# Patient Record
Sex: Male | Born: 1940 | Race: White | Hispanic: No | Marital: Married | Smoking: Never smoker
Health system: Southern US, Community
[De-identification: ages and names within clinical notes are randomized; demographics above are authoritative.]

## PROBLEM LIST (undated history)

## (undated) DIAGNOSIS — R748 Abnormal levels of other serum enzymes: Secondary | ICD-10-CM

## (undated) DIAGNOSIS — M1990 Unspecified osteoarthritis, unspecified site: Secondary | ICD-10-CM

## (undated) DIAGNOSIS — R1310 Dysphagia, unspecified: Secondary | ICD-10-CM

## (undated) DIAGNOSIS — D494 Neoplasm of unspecified behavior of bladder: Secondary | ICD-10-CM

## (undated) DIAGNOSIS — I2510 Atherosclerotic heart disease of native coronary artery without angina pectoris: Secondary | ICD-10-CM

## (undated) DIAGNOSIS — I700 Atherosclerosis of aorta: Secondary | ICD-10-CM

## (undated) DIAGNOSIS — C159 Malignant neoplasm of esophagus, unspecified: Secondary | ICD-10-CM

## (undated) DIAGNOSIS — C801 Malignant (primary) neoplasm, unspecified: Secondary | ICD-10-CM

## (undated) HISTORY — PX: CHOLECYSTECTOMY: SHX55

## (undated) HISTORY — PX: COLONOSCOPY: SHX174

## (undated) HISTORY — PX: EYE SURGERY: SHX253

## (undated) HISTORY — PX: PICC LINE INSERTION: CATH118290

---
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
## 2005-11-14 ENCOUNTER — Ambulatory Visit (HOSPITAL_COMMUNITY): Admission: RE | Admit: 2005-11-14 | Discharge: 2005-11-15 | Payer: Self-pay | Admitting: Surgery

## 2005-11-14 IMAGING — RF DG CHOLANGIOGRAM OPERATIVE
1 series · 7 of 7 positions shown · non-contrast
Comparison: none

CLINICAL DATA: Cholecystectomy for cholelithiasis.  
 INTRAOPERATIVE CHOLANGIOGRAM:
 Procedure/findings:   Intraoperative images were obtained with a C-arm.    Contrast injection shows normal opacified biliary tree without evidence of filling defect or extravasation.  Prompt drainage of contrast is noted into the duodenum.

[Series 1: run · 2 acquisitions, 7 frames shown]
[im 1/2]
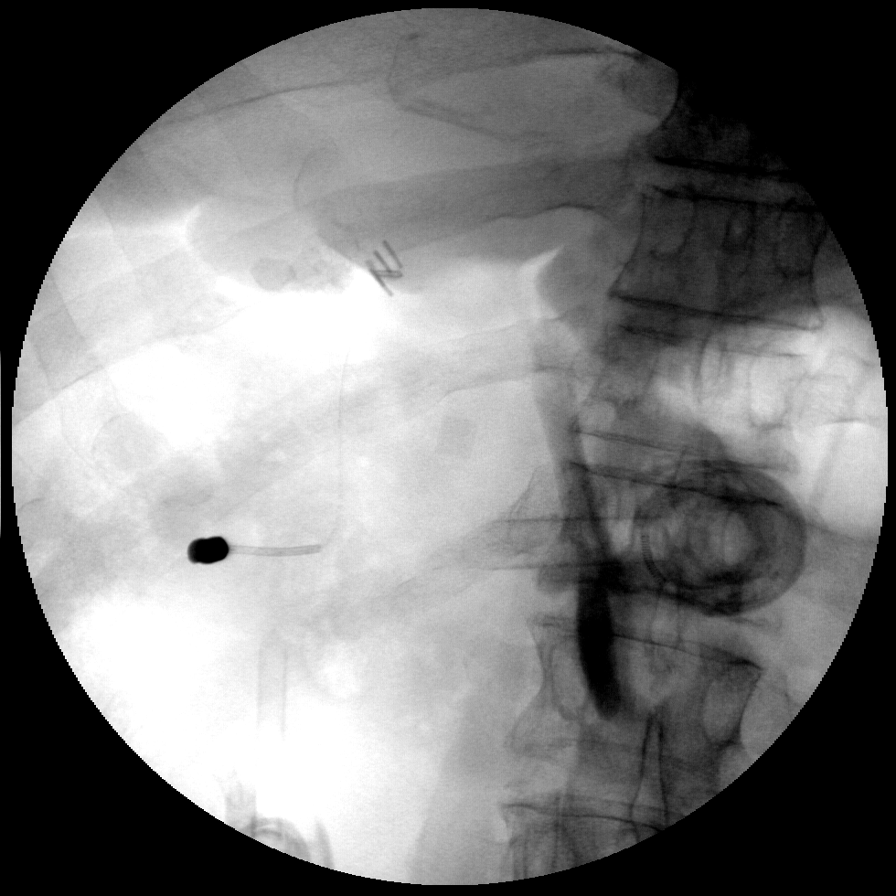
[im 1/2]
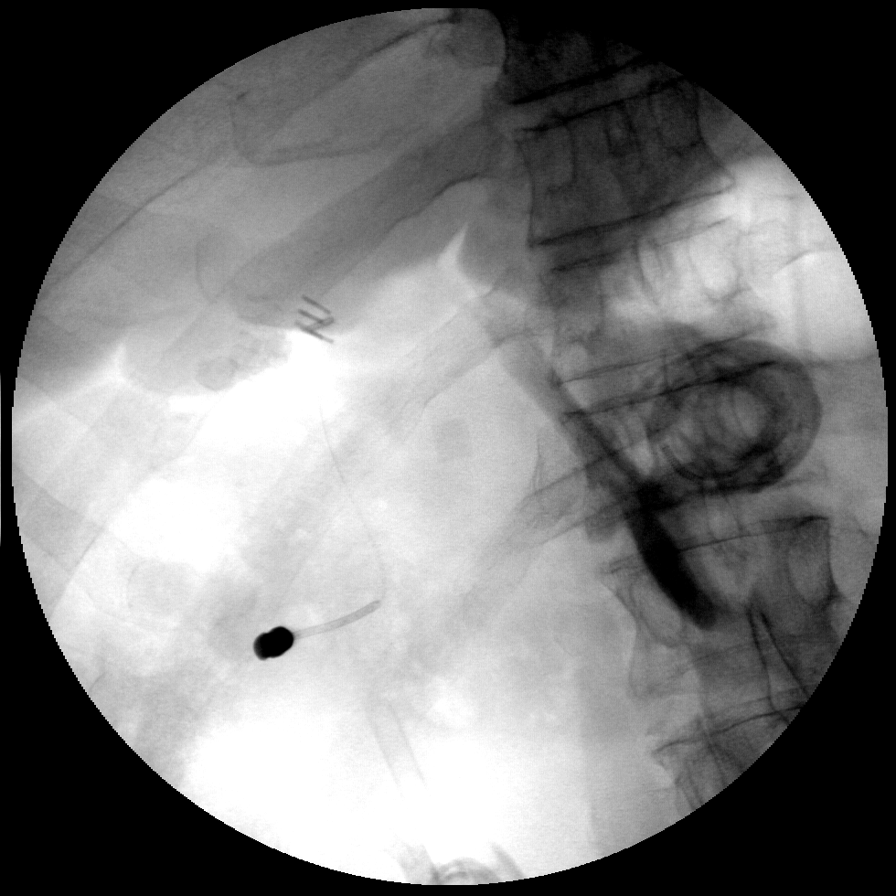
[im 1/2]
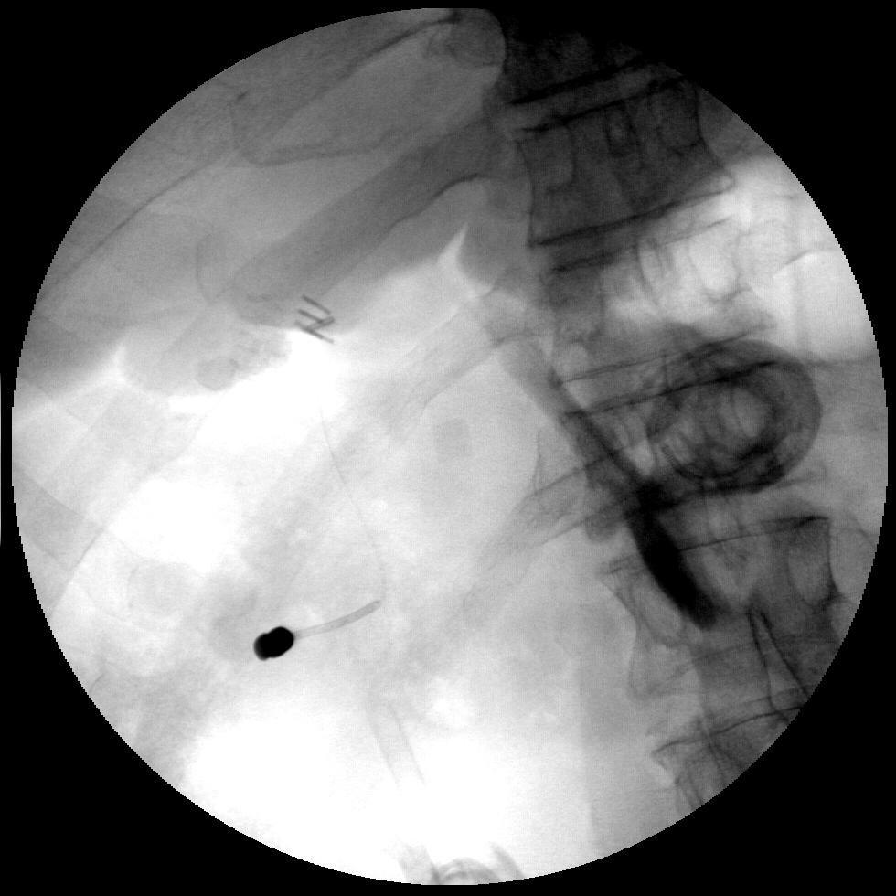
[im 2/2]
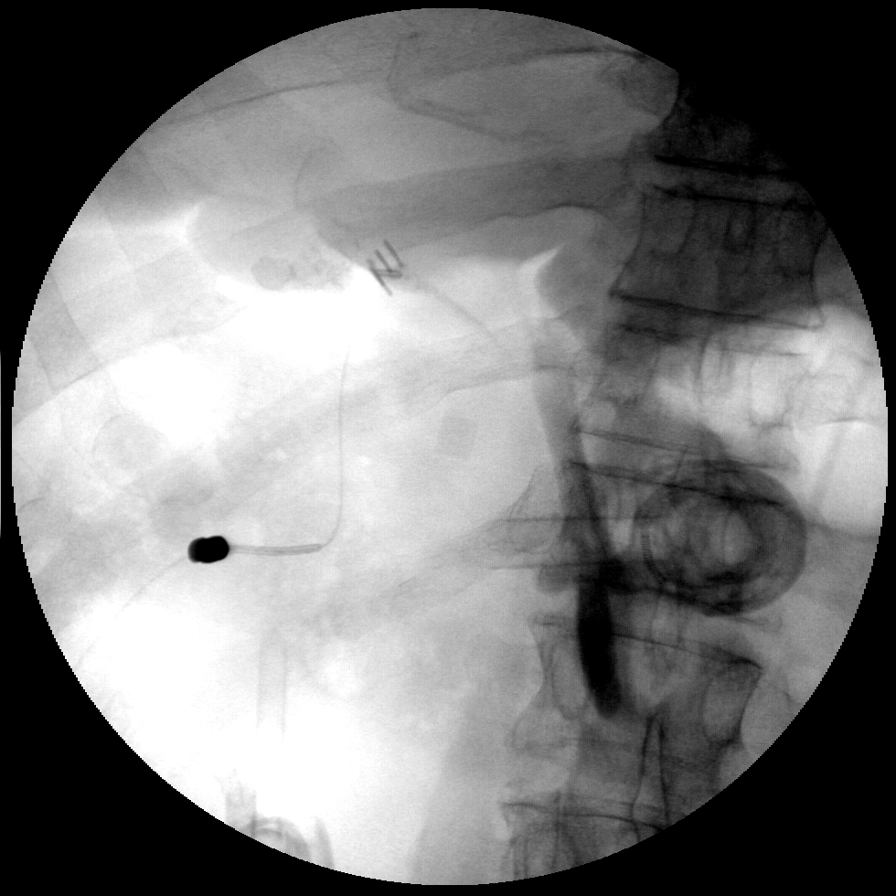
[im 2/2]
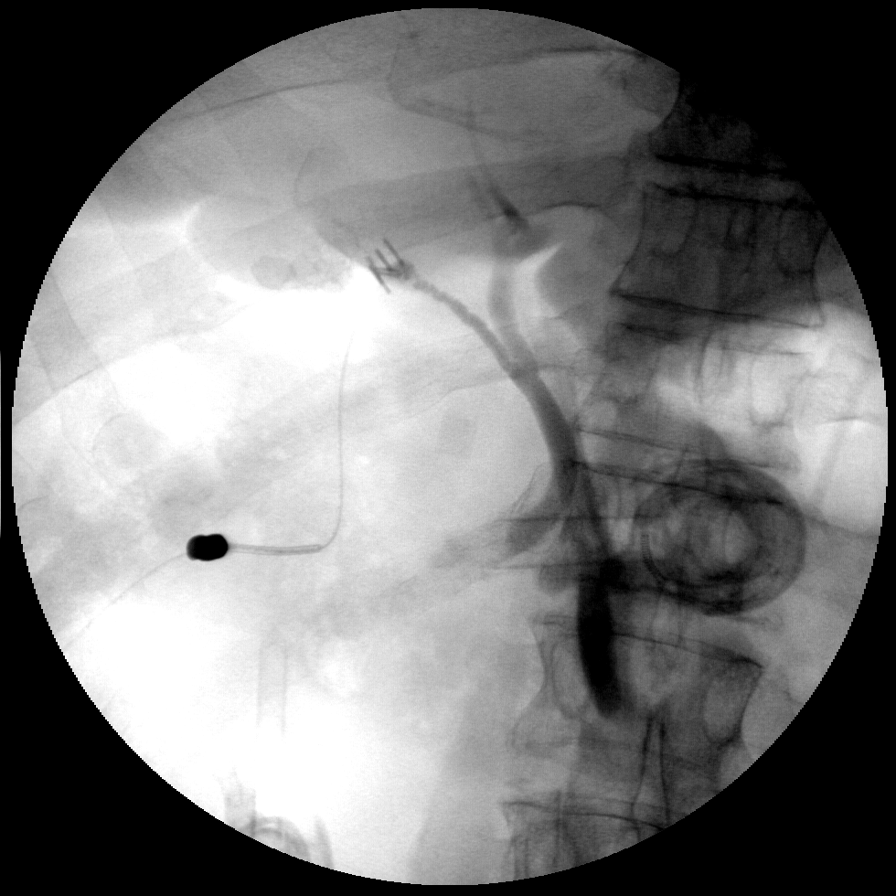
[im 2/2]
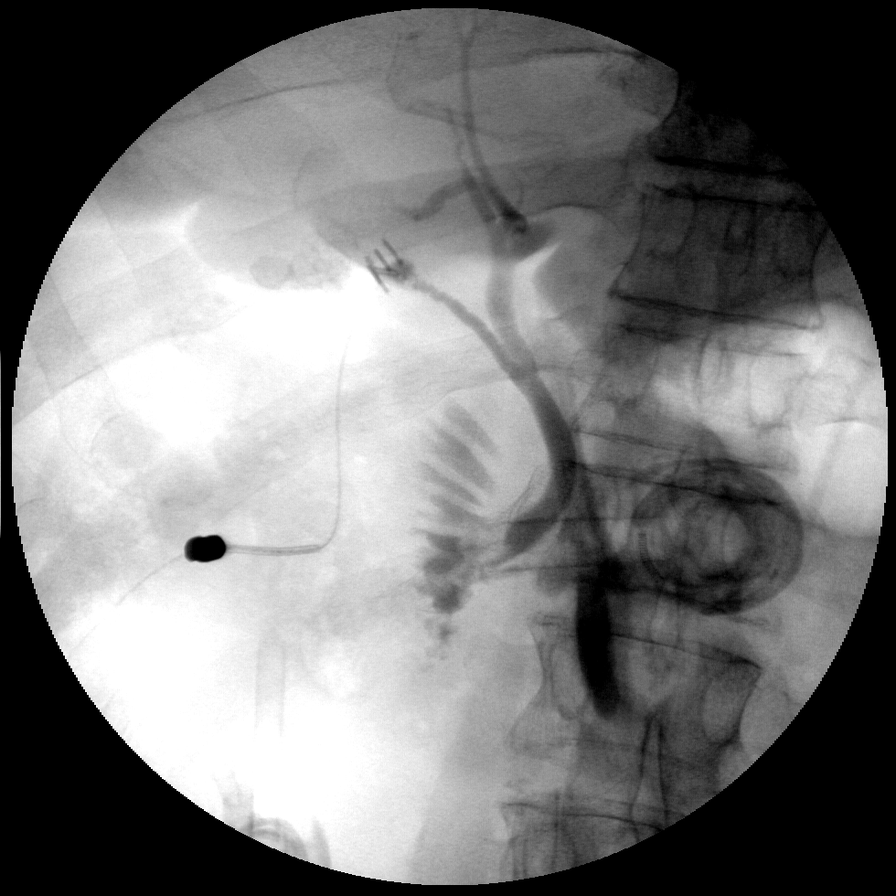
[im 2/2]
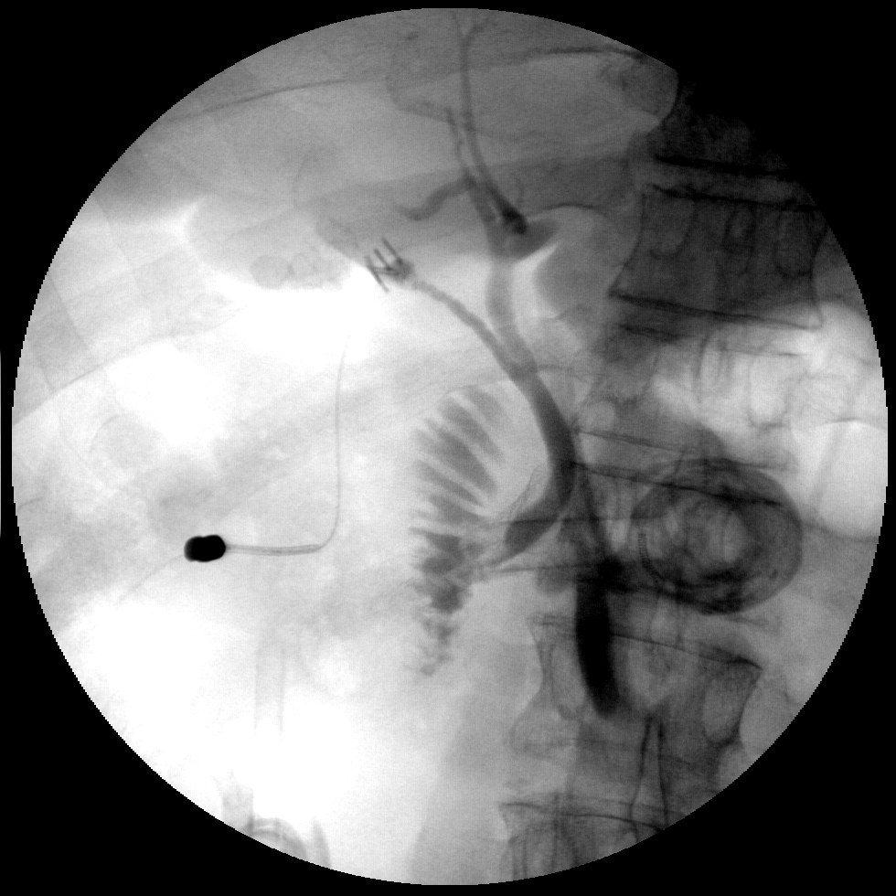

[7 of 7 positions shown; findings below may reference images not displayed]

IMPRESSION: Normal intraoperative cholangiogram.

## 2006-05-11 ENCOUNTER — Emergency Department (HOSPITAL_COMMUNITY): Admission: EM | Admit: 2006-05-11 | Discharge: 2006-05-11 | Payer: Self-pay | Admitting: Emergency Medicine

## 2006-05-11 IMAGING — CT CT ABDOMEN W/O CM
2 of 4 series · 16 of 46 positions shown, 18 images · IV contrast (agent unspecified)
Comparison: none

CLINICAL DATA: Right flank pain.  Right groin pain.  Elevated white count.  No history of stones.  Previous cholecystectomy. 
ABDOMEN CT WITHOUT CONTRAST:
TECHNIQUE: Multidetector CT imaging of the abdomen was performed following the standard protocol without IV contrast.
TECHNIQUE: Multidetector CT imaging of the pelvis was performed following the standard protocol without IV contrast.

[Series 2: stone_wo 5.0 b40f st · axial · 0.76mm/px · z∈[+688,+1040]mm · 13 of 96 slices shown, 15 images]
[im 4/96  soft-tissue]
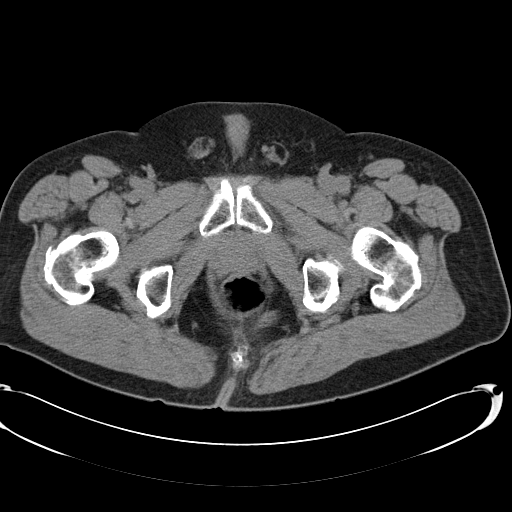
[im 4/96  bone]
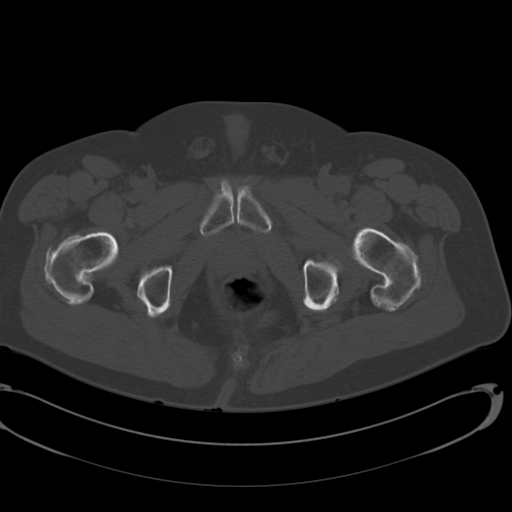
[im 12/96  soft-tissue]
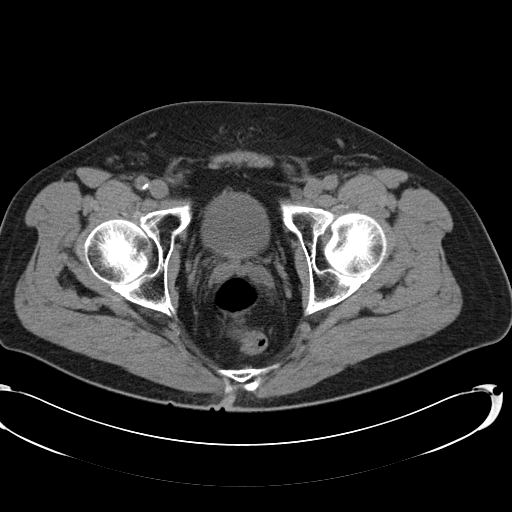
[im 20/96  soft-tissue]
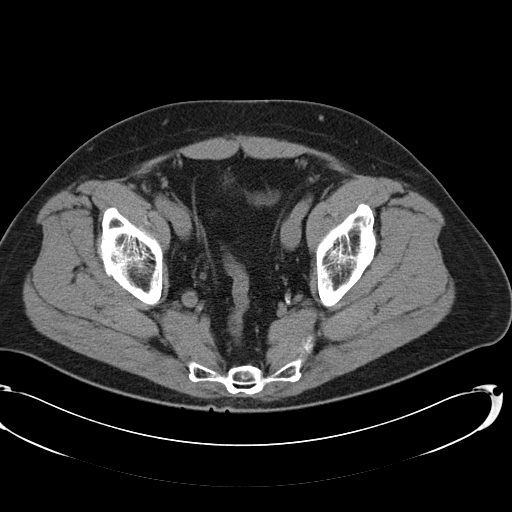
[im 27/96  soft-tissue]
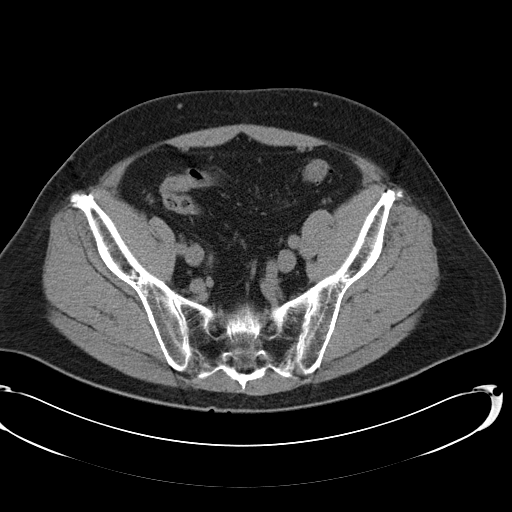
[im 35/96  soft-tissue]
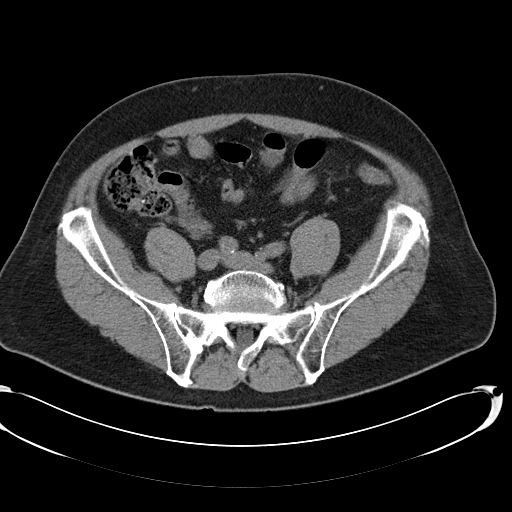
[im 42/96  soft-tissue]
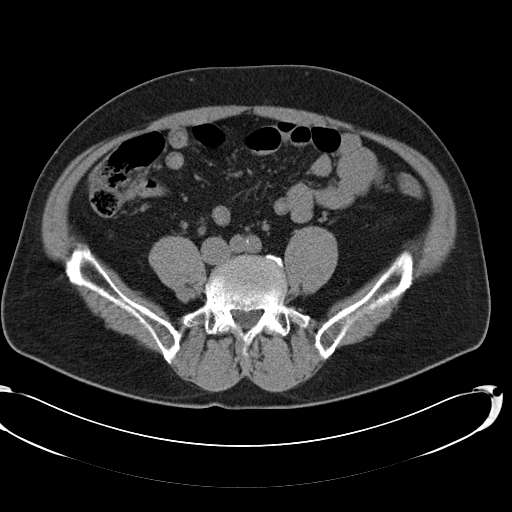
[im 50/96  soft-tissue]
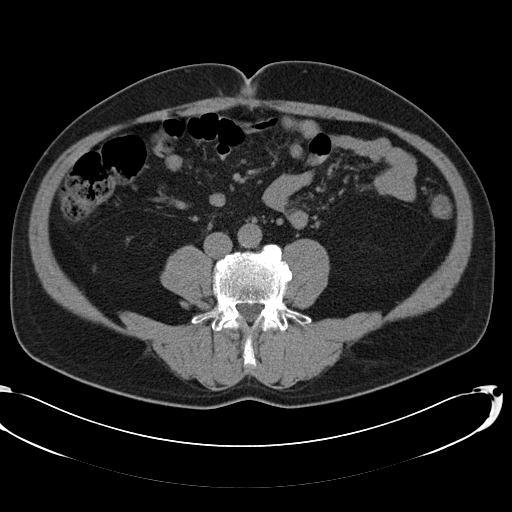
[im 54/96  soft-tissue]
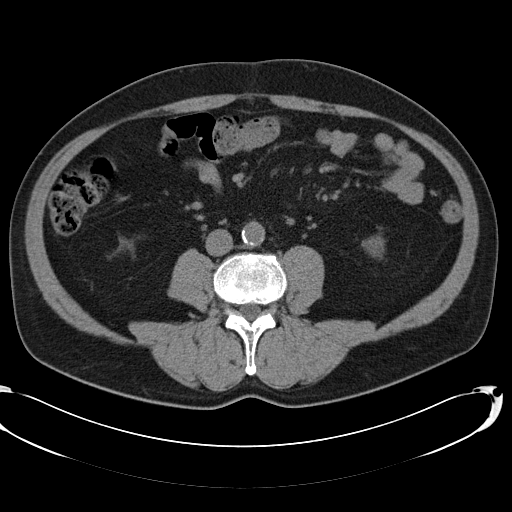
[im 61/96  soft-tissue]
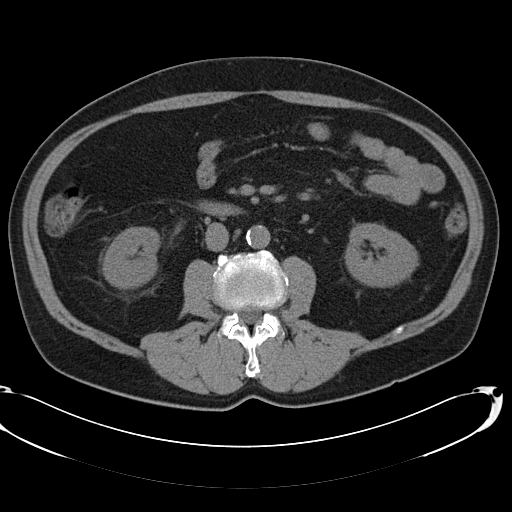
[im 61/96  bone]
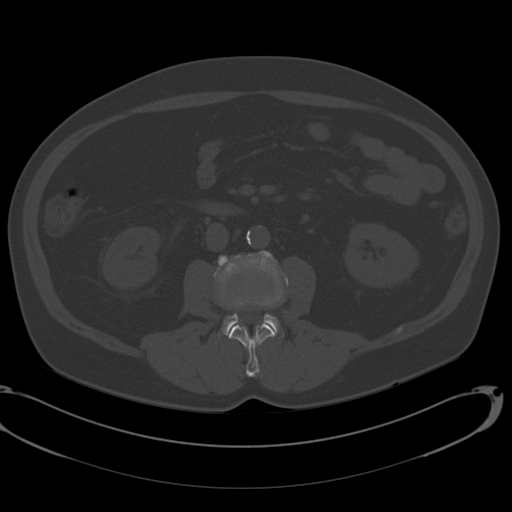
[im 69/96  soft-tissue]
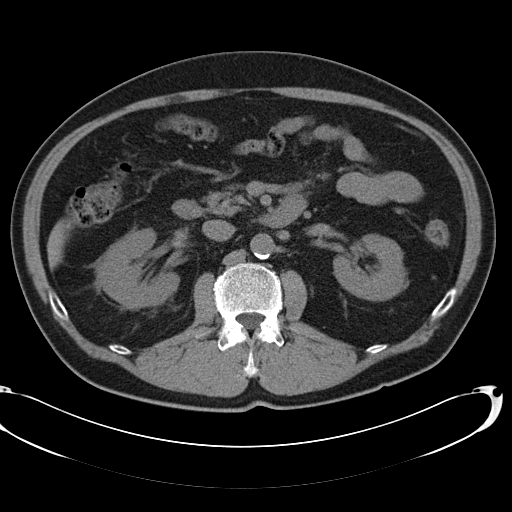
[im 77/96  soft-tissue]
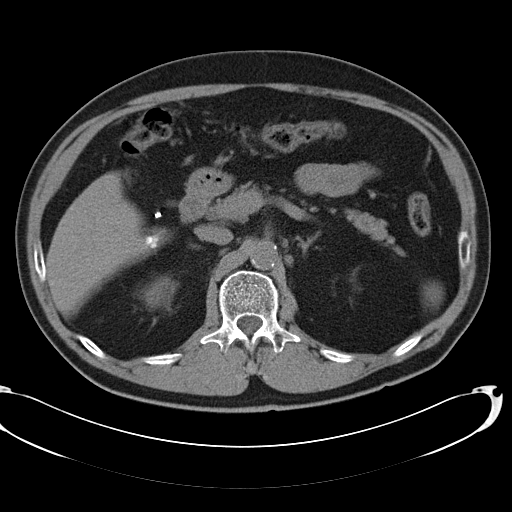
[im 84/96  soft-tissue]
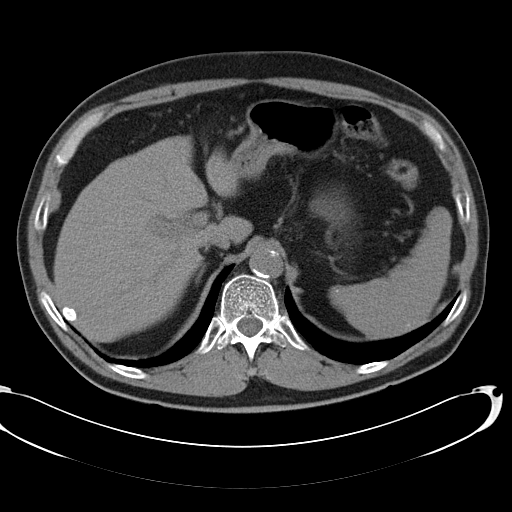
[im 92/96  soft-tissue]
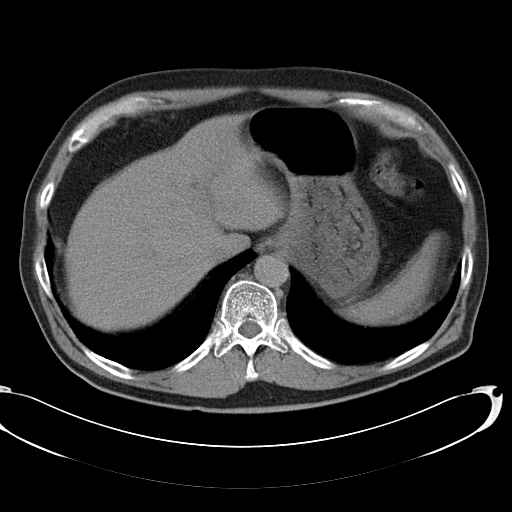

[Series 602: <mpr range> · coronal · 0.79mm/px · 3 of 76 slices shown]
[im 26/76  soft-tissue]
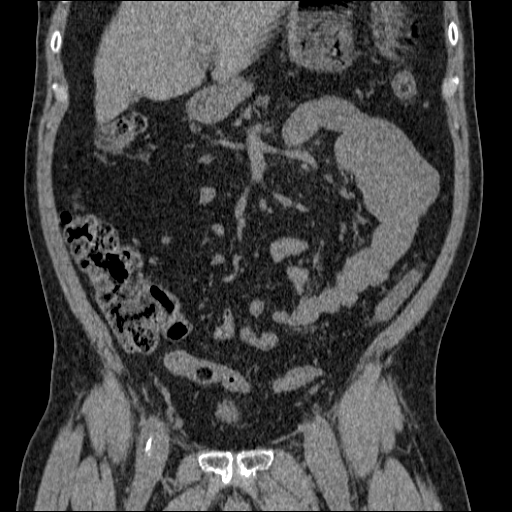
[im 34/76  soft-tissue]
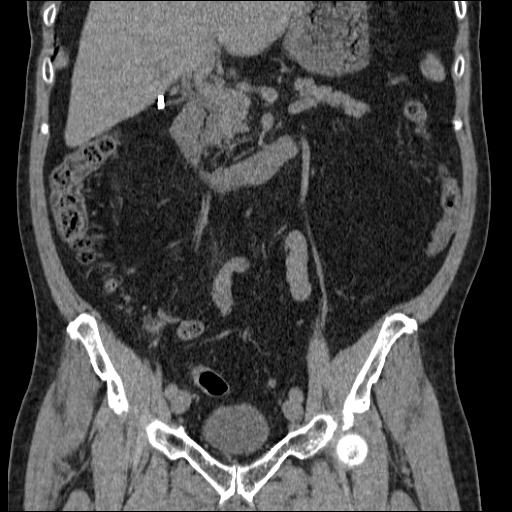
[im 42/76  soft-tissue]
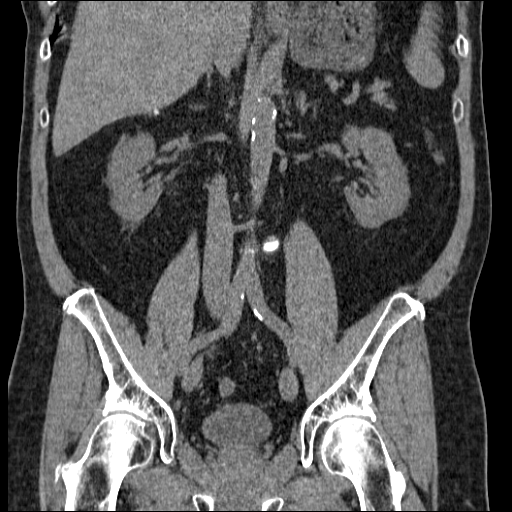

[16 of 46 positions shown; findings below may reference images not displayed]

FINDINGS: Limited views of the lung bases appear clear.  The unenhanced appearance of the liver, spleen, pancreas, and adrenal glands are all normal.  The gallbladder has been removed.  No visible large or small bowel abnormality is detected and the stomach is unremarkable.  
The left kidney is normal in size.  There are no obvious masses.  There is minimal perinephric stranding.
On the right, there are 2 small calculi in the lower pole; these are marked with arrows.  There is no hydronephrosis although there is mild perinephric stranding.  It is unclear if this stranding represents an acute abnormality or the perinephric stranding represents a more chronic process.  I see no proximal ureteral stones.  
There is no adenopathy and there is moderate vascular calcification in the aorta.  
Along the inferior margin of the liver just posterior to the surgical clips, there is a cluster of small calcifications.  I suspect these represent gallstones left behind at the time of cholecystectomy.  A moderate amount of stool is noted in the colon.
IMPRESSION: 1.  Mild right perinephric stranding associated with a few small intrarenal calculi.
2.  No hydronephrosis or renal masses. 
3.  Status post cholecystectomy.  
PELVIS CT WITHOUT CONTRAST:
FINDINGS: The appendix is visualized and is within normal limits.  There is no adenopathy or free pelvic fluid.  There is no evidence for obstructive uropathy.  No adenopathy is seen.  Vascular calcifications are present.  Degenerative changes are present in the spine.
IMPRESSION: No acute pelvic findings.

## 2010-04-14 ENCOUNTER — Ambulatory Visit: Payer: Self-pay | Admitting: Ophthalmology

## 2010-04-18 ENCOUNTER — Ambulatory Visit: Payer: Self-pay | Admitting: Ophthalmology

## 2011-03-23 ENCOUNTER — Ambulatory Visit: Payer: Self-pay | Admitting: Ophthalmology

## 2011-04-03 ENCOUNTER — Ambulatory Visit: Payer: Self-pay | Admitting: Ophthalmology

## 2014-07-05 NOTE — Op Note (Signed)
PATIENT NAME:  Casey Acevedo, Casey Acevedo MR#:  [id number] DATE OF BIRTH:  1940-05-02  DATE OF PROCEDURE:  04/03/2011  PREOPERATIVE DIAGNOSIS:  Cataract, right eye.   POSTOPERATIVE DIAGNOSIS:  Cataract, right eye.  PROCEDURE PERFORMED:  Extracapsular cataract extraction using phacoemulsification with placement of an Alcon [id number], 22.5-diopter posterior chamber lens, serial # [id number].  SURGEON:  Loura Back. Kiri Hinderliter, MD  ASSISTANT:  None.  ANESTHESIA:  4% lidocaine and 0.75% Marcaine in a 50/50 mixture with 10 units per mL of Hylenex Wydase added, given as a peribulbar.  ANESTHESIOLOGIST:  Dr. Marcello Moores   COMPLICATIONS:  None.  ESTIMATED BLOOD LOSS:  Less than 1 mL.  DESCRIPTION OF PROCEDURE:  The patient was brought to the operating room and given a peribulbar block.  The patient was then prepped and draped in the usual fashion.  The vertical rectus muscles were imbricated using 5-0 silk sutures.  These sutures were then clamped to the sterile drapes as bridle sutures.  A limbal peritomy was performed extending two clock hours and hemostasis was obtained with cautery.  A partial thickness scleral groove was made at the surgical limbus and dissected anteriorly in a lamellar dissection using an Alcon crescent knife.  The anterior chamber was entered superonasally with a Superblade and through the lamellar dissection with a 2.6 mm keratome.  DisCoVisc was used to replace the aqueous and a continuous tear capsulorrhexis was carried out.  Hydrodissection and hydrodelineation were carried out with balanced salt and a 27 gauge canula.  The nucleus was rotated to confirm the effectiveness of the hydrodissection.  Phacoemulsification was carried out using a divide-and-conquer technique.  Total ultrasound time was 1 minute and 5 seconds with an average power of 18.3 percent.  Irrigation/aspiration was used to remove the residual cortex.  DisCoVisc was used to inflate the capsule and the internal incision was  enlarged to 3 mm with the crescent knife.  The intraocular lens was folded and inserted into the capsular bag using the Acrysert delivery system.  Irrigation/aspiration was used to remove the residual DisCoVisc.  Miostat was injected into the anterior chamber through the paracentesis track to inflate the anterior chamber and induce miosis.  The wound was checked for leaks and none were found. The conjunctiva was closed with cautery and the bridle sutures were removed.  Two drops of 0.3% Vigamox were placed on the eye.   An eye shield was placed on the eye.  The patient was discharged to the recovery room in good condition.  ____________________________ Loura Back Jya Hughston, MD sad:drc D: 04/03/2011 13:12:11 ET T: 04/03/2011 13:34:17 ET JOB#: [id number]  cc: Remo Lipps A. Jensine Luz, MD, <Dictator> Martie Lee MD ELECTRONICALLY SIGNED 04/10/2011 13:08

## 2016-03-13 DIAGNOSIS — C159 Malignant neoplasm of esophagus, unspecified: Secondary | ICD-10-CM

## 2016-03-13 HISTORY — DX: Malignant neoplasm of esophagus, unspecified: C15.9

## 2016-06-01 DIAGNOSIS — K219 Gastro-esophageal reflux disease without esophagitis: Secondary | ICD-10-CM | POA: Insufficient documentation

## 2016-06-09 IMAGING — US US HEPATIC LIVER DOPPLER
1 series · 14 of 25 positions shown · non-contrast
Comparison: PET-CT [DATE]

CLINICAL DATA: Esophageal cancer

EXAM:
DUPLEX ULTRASOUND OF LIVER
TECHNIQUE: Color and duplex Doppler ultrasound was performed to evaluate the
hepatic in-flow and out-flow vessels.

[Series 1: us hepatic liver doppler · 0.25mm/px · 14 of 49 slices shown]
[im 1/49]
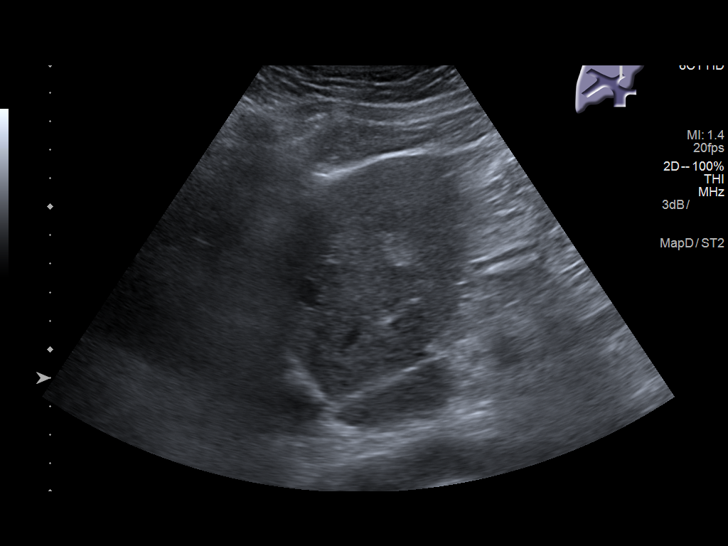
[im 5/49]
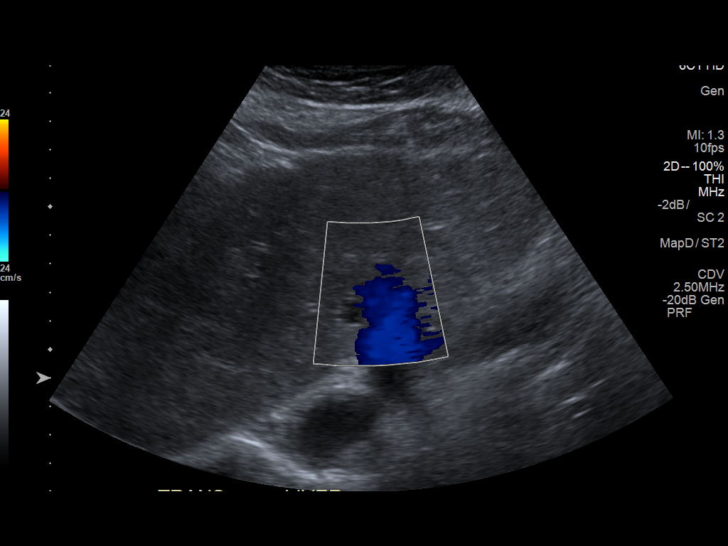
[im 9/49]
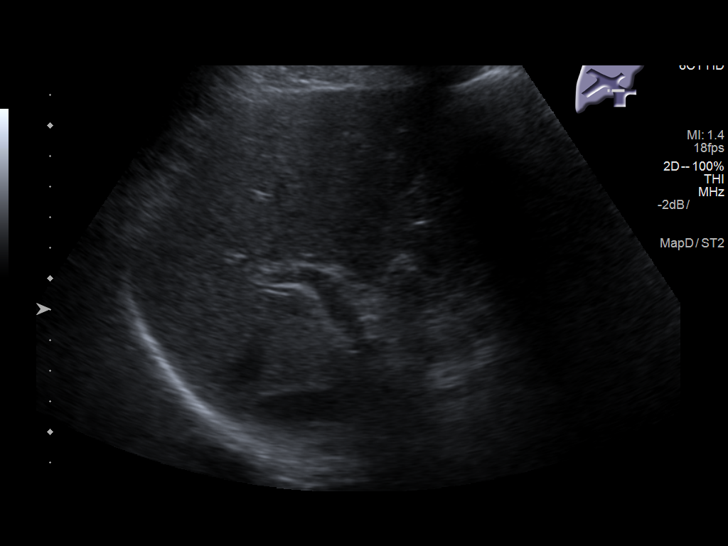
[im 13/49]
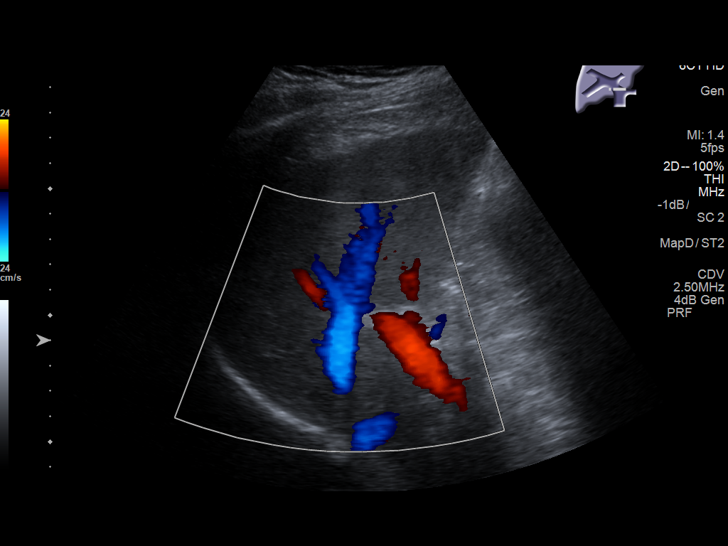
[im 17/49]
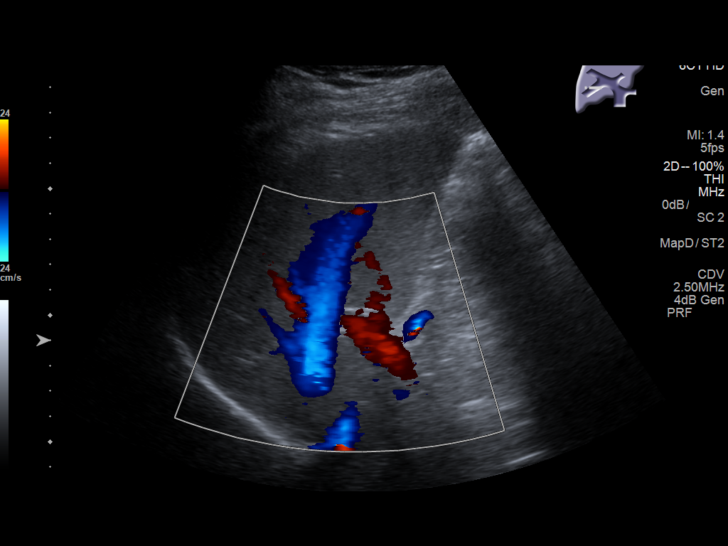
[im 19/49]
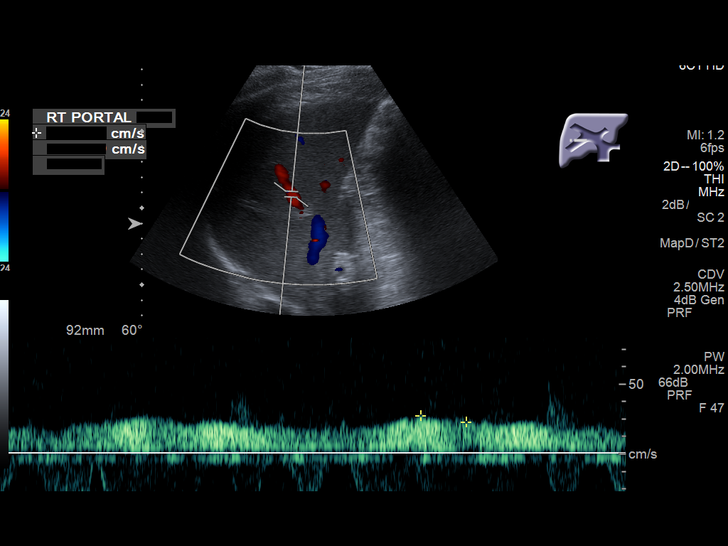
[im 23/49]
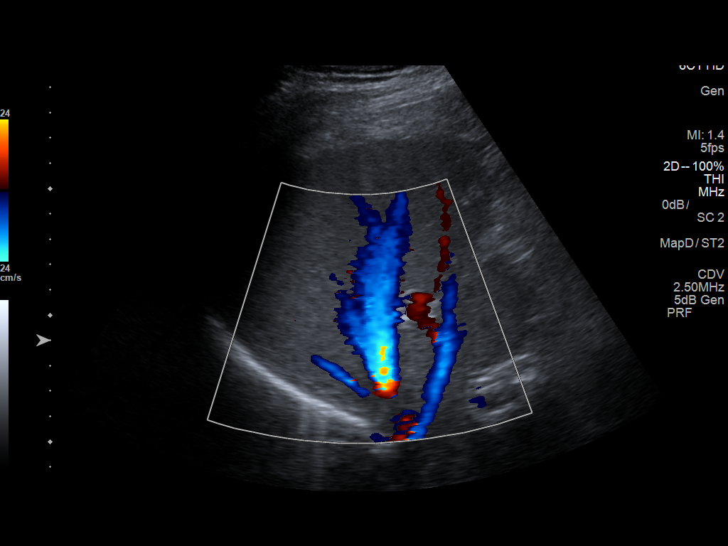
[im 27/49]
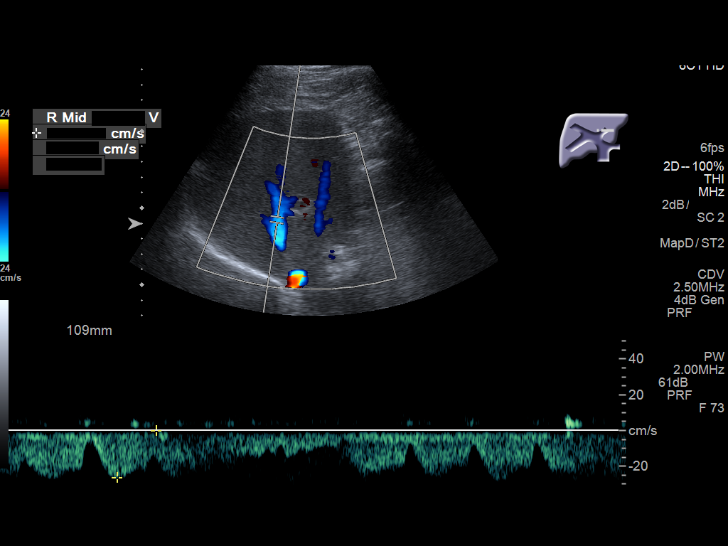
[im 31/49]
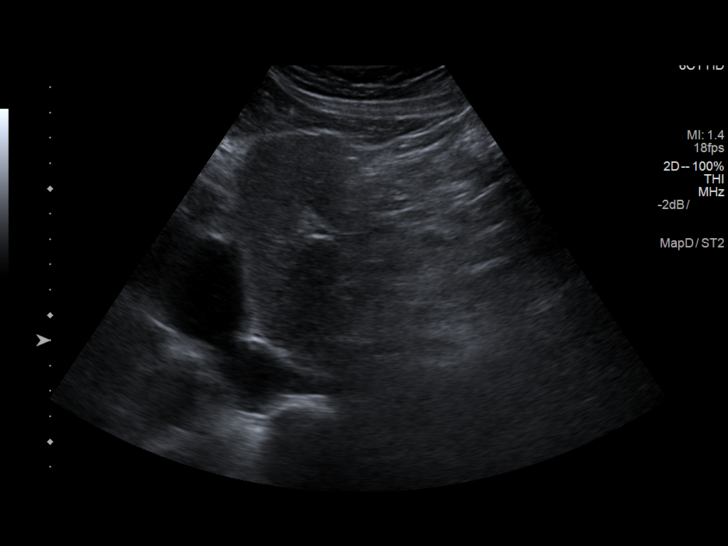
[im 33/49]
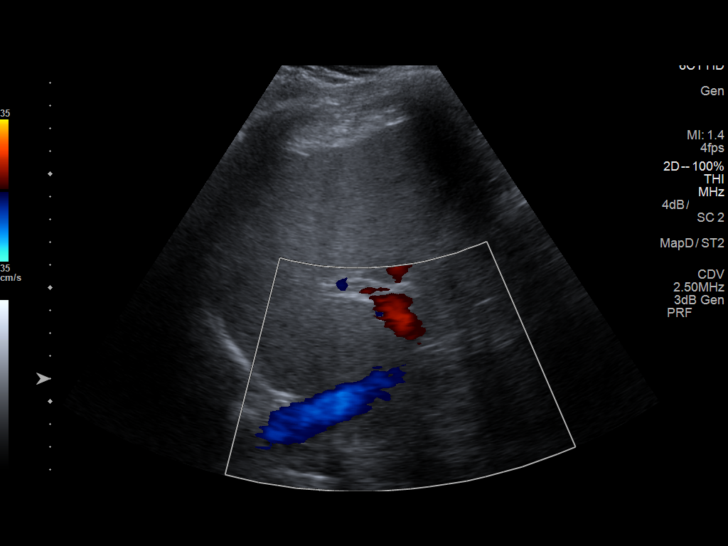
[im 37/49]
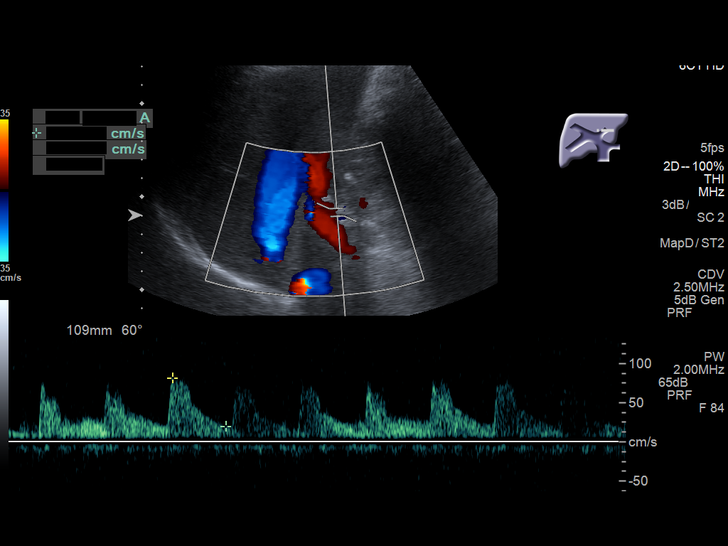
[im 41/49]
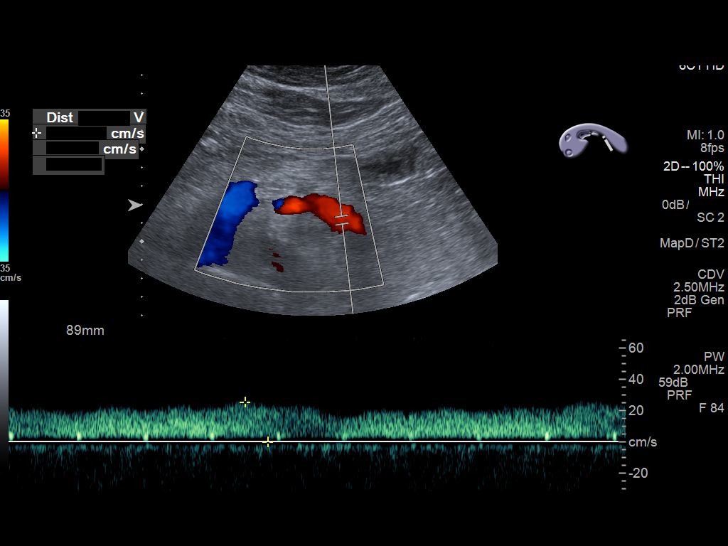
[im 45/49]
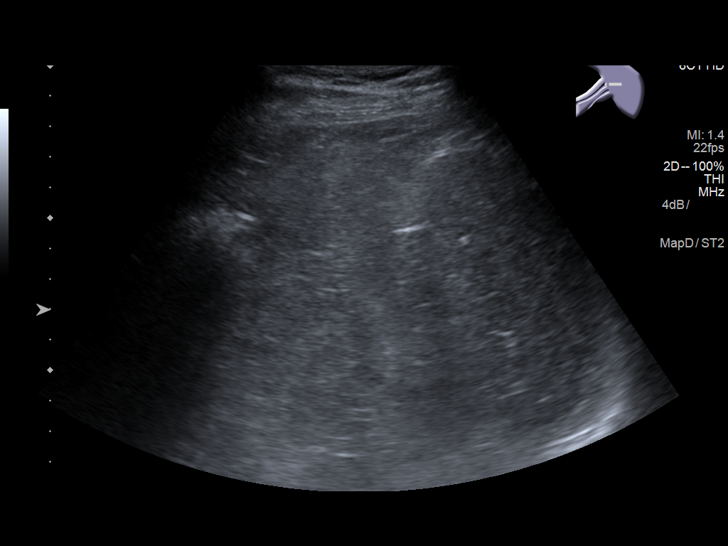
[im 49/49]
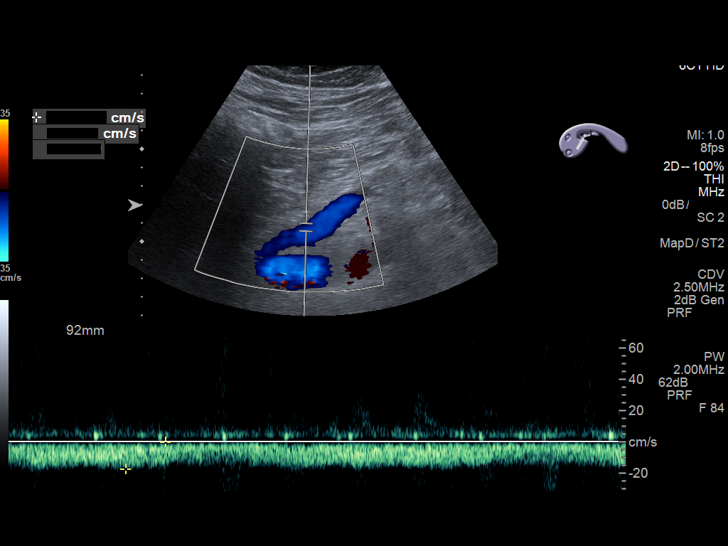

[14 of 25 positions shown; findings below may reference images not displayed]

FINDINGS: Portal Vein Velocities

Main:  41 cm/sec

Right:  28 cm/sec

Left:  22 cm/sec

Hepatic Vein Velocities

Right:  17 cm/sec

Middle:  26 cm/sec

Left:  23 cm/sec

Hepatic Artery Velocity:  82 cm/sec

Splenic Vein Velocity:  21 cm/sec

Varices: Absent

Ascites: Absent

Benign-appearing cyst in the left lobe is stable compare with the
prior imaging study. Portal vein is patent with hepatopetal flow.
Hepatic veins are patent with hepatofugal flow. Splenic vein is also
patent with directionality towards the portal vein.
IMPRESSION: Hepatic, portal, and splenic veins are patent with normal
directionality of flow.

## 2016-08-08 ENCOUNTER — Encounter: Payer: Self-pay | Admitting: Urology

## 2016-08-08 ENCOUNTER — Ambulatory Visit (INDEPENDENT_AMBULATORY_CARE_PROVIDER_SITE_OTHER): Payer: Medicare Other | Admitting: Urology

## 2016-08-08 VITALS — BP 136/78 | HR 84 | Ht 69.0 in | Wt 184.3 lb

## 2016-08-08 DIAGNOSIS — R972 Elevated prostate specific antigen [PSA]: Secondary | ICD-10-CM | POA: Diagnosis not present

## 2016-08-08 DIAGNOSIS — N138 Other obstructive and reflux uropathy: Secondary | ICD-10-CM

## 2016-08-08 DIAGNOSIS — R350 Frequency of micturition: Secondary | ICD-10-CM | POA: Diagnosis not present

## 2016-08-08 DIAGNOSIS — N401 Enlarged prostate with lower urinary tract symptoms: Secondary | ICD-10-CM | POA: Diagnosis not present

## 2016-08-08 NOTE — Progress Notes (Signed)
08/08/2016 11:10 AM   Casey Acevedo 11/25/1940 [id number]  Referring provider: Idelle Crouch, MD [facility] [place], [place] [address]  Chief Complaint  Patient presents with  . Elevated PSA    HPI: Consultation for PSA elevation. Patient underwent PSA testing which revealed 06/15/2016 PSA of 5.6, UA negative. Follow-up PSA 07/17/2016 was 6.6. No FH of PCa.  He has a h/o BPH. He takes tamsulosin but it makes him dizzy. He's been on it for 2 months and He's not sure why. His AUASS = 3, occasional frequency after soda.  He denies ED.     PMH: No past medical history on file.  Surgical History: No past surgical history on file.  Home Medications:  Allergies as of 08/08/2016   No Known Allergies     Medication List       Accurate as of 08/08/16 11:10 AM. Always use your most recent med list.          aspirin EC 81 MG tablet Take by mouth.   MULTI-VITAMINS Tabs Take by mouth.   ranitidine 150 MG tablet Commonly known as:  ZANTAC Take by mouth.   tamsulosin 0.4 MG Caps capsule Commonly known as:  FLOMAX Take by mouth.       Allergies: No Known Allergies  Family History: No family history on file.  Social History:  has no tobacco, alcohol, and drug history on file.  ROS:                                        Physical Exam: There were no vitals taken for this visit.  Constitutional:  Alert and oriented, No acute distress. HEENT: [place] AT, moist mucus membranes.  Trachea midline, no masses. Cardiovascular: No clubbing, cyanosis, or edema. Respiratory: Normal respiratory effort, no increased work of breathing. GI: Abdomen is soft, nontender, nondistended, no abdominal masses GU: No CVA tenderness.  DRE: R>L lobe, no hard area or nodule, landmarks preserved, prostate about 60 grams Skin: No rashes, bruises or suspicious lesions. Lymph: No cervical or inguinal adenopathy. Neurologic: Grossly  intact, no focal deficits, moving all 4 extremities. Psychiatric: Normal mood and affect.  Laboratory Data: No results found for: WBC, HGB, HCT, MCV, PLT  No results found for: CREATININE  No results found for: PSA  No results found for: TESTOSTERONE  No results found for: HGBA1C  Urinalysis No results found for: COLORURINE, APPEARANCEUR, LABSPEC, PHURINE, GLUCOSEU, HGBUR, BILIRUBINUR, KETONESUR, PROTEINUR, UROBILINOGEN, NITRITE, LEUKOCYTESUR   Assessment & Plan:    1) PSA elevation - I had a long discussion with the patient on the nature of elevated PSA - benign vs malignant causes. We discussed age specific levels and that PCa can be seen on a biopsy with very low PSA levels (<=2.5). We discussed the nature risks and benefits of continued surveillance, other lab tests, imaging as well as prostate biopsy. We discussed the management of prostate cancer might include active surveillance or treatment depending on biopsy findings. All questions answered. We'll recheck a PSA. He's leaving on an RV trip and will be back end of June, so we'll check a PSA then and consider biopsy.   2) BPH with LUTS - discussed nature r/b of alpha blockers. He'll consider stopping it. He has no bothersome LUTS and hasnt noticed a difference on it.  There are no diagnoses linked to this encounter.  No Follow-up on file.  Festus Aloe, [facility] [address] [place], [place] [address] [phone]

## 2016-09-07 ENCOUNTER — Other Ambulatory Visit: Payer: Medicare Other

## 2016-09-07 DIAGNOSIS — R972 Elevated prostate specific antigen [PSA]: Secondary | ICD-10-CM

## 2016-09-08 LAB — FPSA% REFLEX
% FREE PSA: 10.6 %
PSA, FREE: 0.51 ng/mL

## 2016-09-08 LAB — PSA TOTAL (REFLEX TO FREE): PROSTATE SPECIFIC AG, SERUM: 4.8 ng/mL — AB (ref 0.0–4.0)

## 2016-09-11 ENCOUNTER — Telehealth: Payer: Self-pay

## 2016-09-11 NOTE — Telephone Encounter (Signed)
-----   Message from Festus Aloe, MD sent at 09/09/2016  5:36 PM EDT ----- Notify patient his PSA is lower at 4.8 - give result. See [place] back in 3 months with a PSA prior.    ----- Message ----- From: Leia Alf, CMA Sent: 09/08/2016   8:49 AM To: Festus Aloe, MD    ----- Message ----- From: Lavone Neri Lab Results In Sent: 09/08/2016   7:45 AM To: Rowe Robert Clinical

## 2016-09-11 NOTE — Telephone Encounter (Signed)
Left pt mess to call 

## 2016-11-06 ENCOUNTER — Ambulatory Visit: Payer: Medicare Other

## 2016-11-24 ENCOUNTER — Other Ambulatory Visit: Payer: Self-pay | Admitting: Internal Medicine

## 2016-11-24 DIAGNOSIS — R1310 Dysphagia, unspecified: Secondary | ICD-10-CM

## 2016-11-24 DIAGNOSIS — R1013 Epigastric pain: Secondary | ICD-10-CM

## 2016-11-24 DIAGNOSIS — K219 Gastro-esophageal reflux disease without esophagitis: Secondary | ICD-10-CM

## 2016-11-29 ENCOUNTER — Other Ambulatory Visit: Payer: Medicare Other

## 2016-12-06 ENCOUNTER — Other Ambulatory Visit: Payer: Self-pay

## 2016-12-06 ENCOUNTER — Inpatient Hospital Stay
Admission: EM | Admit: 2016-12-06 | Discharge: 2016-12-12 | DRG: 391 | Disposition: A | Discharge status: Home / Self Care | Payer: Medicare Other | Attending: Internal Medicine | Admitting: Internal Medicine

## 2016-12-06 ENCOUNTER — Ambulatory Visit
Admission: RE | Admit: 2016-12-06 | Discharge: 2016-12-06 | Disposition: A | Discharge status: Home / Self Care | Payer: Medicare Other | Source: Ambulatory Visit | Attending: Internal Medicine | Admitting: Internal Medicine

## 2016-12-06 ENCOUNTER — Other Ambulatory Visit: Payer: Self-pay | Admitting: Internal Medicine

## 2016-12-06 ENCOUNTER — Encounter: Payer: Self-pay | Admitting: *Deleted

## 2016-12-06 ENCOUNTER — Ambulatory Visit: Payer: Medicare Other

## 2016-12-06 DIAGNOSIS — K222 Esophageal obstruction: Secondary | ICD-10-CM

## 2016-12-06 DIAGNOSIS — K219 Gastro-esophageal reflux disease without esophagitis: Secondary | ICD-10-CM

## 2016-12-06 DIAGNOSIS — Z8249 Family history of ischemic heart disease and other diseases of the circulatory system: Secondary | ICD-10-CM

## 2016-12-06 DIAGNOSIS — R1310 Dysphagia, unspecified: Secondary | ICD-10-CM

## 2016-12-06 DIAGNOSIS — R1013 Epigastric pain: Secondary | ICD-10-CM

## 2016-12-06 DIAGNOSIS — Z9049 Acquired absence of other specified parts of digestive tract: Secondary | ICD-10-CM

## 2016-12-06 DIAGNOSIS — E162 Hypoglycemia, unspecified: Secondary | ICD-10-CM | POA: Diagnosis present

## 2016-12-06 DIAGNOSIS — Z7982 Long term (current) use of aspirin: Secondary | ICD-10-CM

## 2016-12-06 DIAGNOSIS — R197 Diarrhea, unspecified: Secondary | ICD-10-CM | POA: Diagnosis not present

## 2016-12-06 DIAGNOSIS — E43 Unspecified severe protein-calorie malnutrition: Secondary | ICD-10-CM | POA: Insufficient documentation

## 2016-12-06 DIAGNOSIS — Z6826 Body mass index (BMI) 26.0-26.9, adult: Secondary | ICD-10-CM

## 2016-12-06 HISTORY — DX: Dysphagia, unspecified: R13.10

## 2016-12-06 LAB — CBC WITH DIFFERENTIAL/PLATELET
BASOS PCT: 2 %
Basophils Absolute: 0.1 10*3/uL (ref 0–0.1)
EOS ABS: 0.1 10*3/uL (ref 0–0.7)
Eosinophils Relative: 1 %
HEMATOCRIT: 45.3 % (ref 40.0–52.0)
Hemoglobin: 15.4 g/dL (ref 13.0–18.0)
Lymphocytes Relative: 32 %
Lymphs Abs: 2.1 10*3/uL (ref 1.0–3.6)
MCH: 30.2 pg (ref 26.0–34.0)
MCHC: 34.1 g/dL (ref 32.0–36.0)
MCV: 88.7 fL (ref 80.0–100.0)
MONO ABS: 0.5 10*3/uL (ref 0.2–1.0)
MONOS PCT: 8 %
NEUTROS ABS: 3.7 10*3/uL (ref 1.4–6.5)
Neutrophils Relative %: 57 %
Platelets: 237 10*3/uL (ref 150–440)
RBC: 5.11 MIL/uL (ref 4.40–5.90)
RDW: 13.6 % (ref 11.5–14.5)
WBC: 6.5 10*3/uL (ref 3.8–10.6)

## 2016-12-06 LAB — COMPREHENSIVE METABOLIC PANEL
ALBUMIN: 4.4 g/dL (ref 3.5–5.0)
ALK PHOS: 63 U/L (ref 38–126)
ALT: 15 U/L — AB (ref 17–63)
AST: 15 U/L (ref 15–41)
Anion gap: 12 (ref 5–15)
BUN: 14 mg/dL (ref 6–20)
CALCIUM: 9.3 mg/dL (ref 8.9–10.3)
CO2: 23 mmol/L (ref 22–32)
CREATININE: 1.11 mg/dL (ref 0.61–1.24)
Chloride: 103 mmol/L (ref 101–111)
GFR calc Af Amer: >60 mL/min (ref >60)
GFR calc non Af Amer: >60 mL/min (ref >60)
GLUCOSE: 78 mg/dL (ref 65–99)
Potassium: 3.9 mmol/L (ref 3.5–5.1)
SODIUM: 138 mmol/L (ref 135–145)
Total Bilirubin: 1 mg/dL (ref 0.3–1.2)
Total Protein: 7.7 g/dL (ref 6.5–8.1)

## 2016-12-06 LAB — PROTIME-INR
INR: 1.03
Prothrombin Time: 13.4 seconds (ref 11.4–15.2)

## 2016-12-06 IMAGING — RF DG ESOPHAGUS
2 series · 8 of 8 positions shown · non-contrast
Comparison: None.

CLINICAL DATA: Difficulty swallowing, constant vomiting

EXAM:
ESOPHOGRAM/BARIUM SWALLOW
TECHNIQUE: Single contrast examination was performed using  thick barium.
FLUOROSCOPY TIME:  Fluoroscopy Time:  0.5 minute
Radiation Exposure Index (if provided by the fluoroscopic device):
5.7 mGy
Number of Acquired Spot Images: 0

[Series 1: cp_standard · 0.51mm/px · 4 of 164 frames shown (1 of 2)]
[frame 1/164]
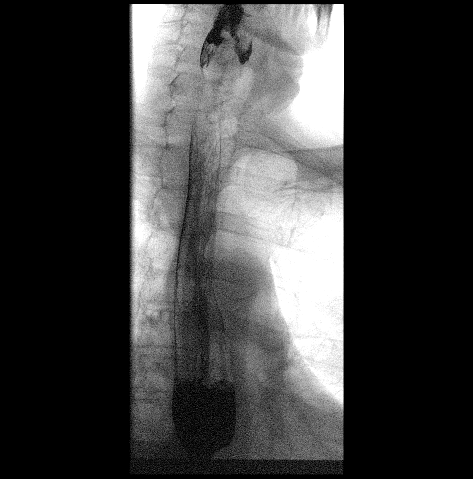
[frame 25/164]
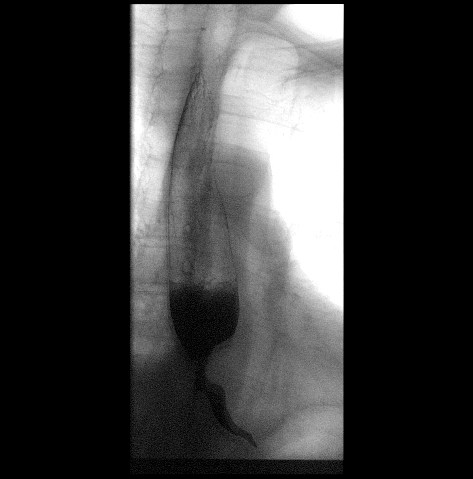
[frame 83/164]
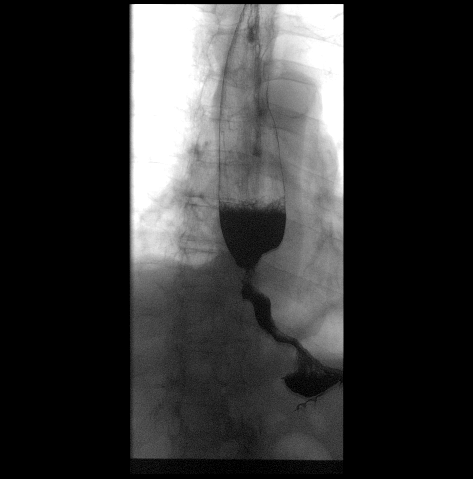
[frame 140/164]
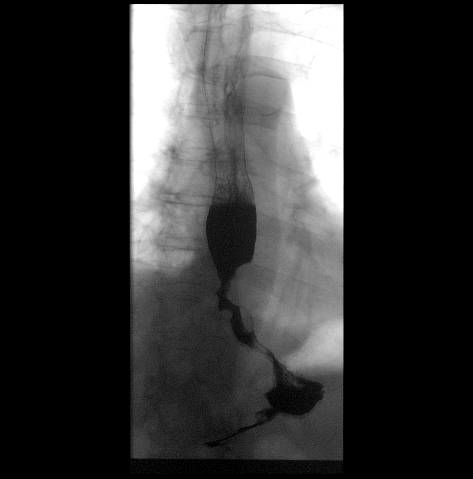

[Series 2: cp_standard · 0.51mm/px · 4 of 29 frames shown (2 of 2)]
[frame 5/29]
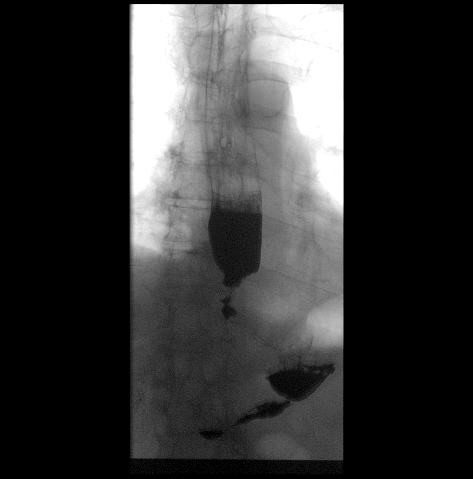
[frame 15/29]
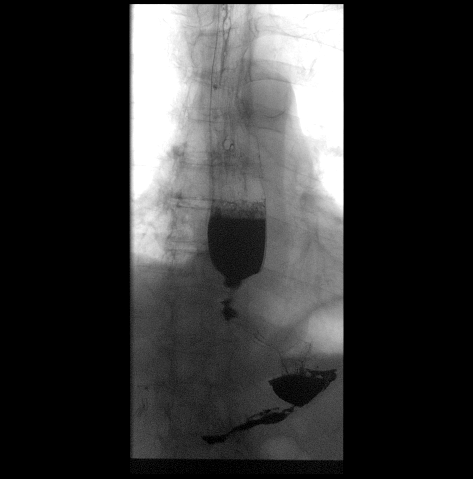
[frame 25/29]
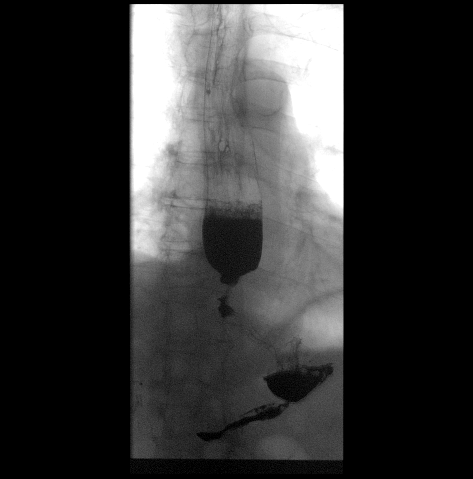
[frame 28/29]
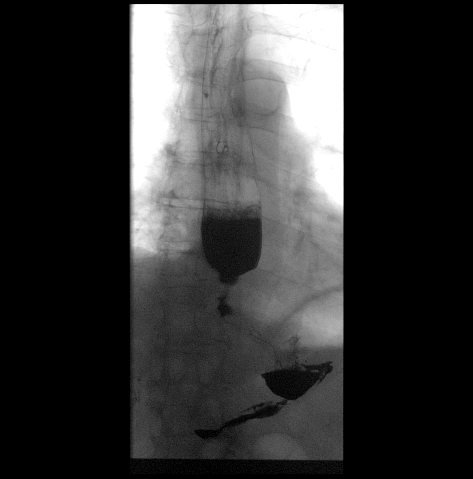

[8 of 8 positions shown; findings below may reference images not displayed]

FINDINGS: There was normal pharyngeal anatomy and motility. Patient is
extremely nauseous. Mild esophageal dilatation with a high-grade
stricture of the distal esophagus with irregular margins.
IMPRESSION: 1. High-grade stricture of the distal esophagus with irregular
margins. This is most concerning for an inflammatory stricture, but
malignancy cannot be excluded. Recommend upper endoscopy.

## 2016-12-06 MED ORDER — ONDANSETRON HCL 4 MG/2ML IJ SOLN
4.0000 mg | Freq: Four times a day (QID) | INTRAMUSCULAR | Status: DC | PRN
  Administered 2016-12-07: 16:00:00 4 mg via INTRAVENOUS
  Filled 2016-12-06: qty 2

## 2016-12-06 MED ORDER — ACETAMINOPHEN 650 MG RE SUPP: 650.0000 mg | Freq: Four times a day (QID) | RECTAL | Status: DC | PRN

## 2016-12-06 MED ORDER — SODIUM CHLORIDE 0.9 % IV SOLN
INTRAVENOUS | Status: DC
  Administered 2016-12-06: 20:00:00 via INTRAVENOUS

## 2016-12-06 MED ORDER — ACETAMINOPHEN 325 MG PO TABS: 650.0000 mg | ORAL_TABLET | Freq: Four times a day (QID) | ORAL | Status: DC | PRN

## 2016-12-06 MED ORDER — PANTOPRAZOLE SODIUM 40 MG IV SOLR
40.0000 mg | Freq: Two times a day (BID) | INTRAVENOUS | Status: DC
  Administered 2016-12-06 – 2016-12-11 (×10): 40 mg via INTRAVENOUS
  Filled 2016-12-06 (×10): qty 40

## 2016-12-06 MED ORDER — ENOXAPARIN SODIUM 40 MG/0.4ML ~~LOC~~ SOLN
40.0000 mg | SUBCUTANEOUS | Status: DC
  Filled 2016-12-06 (×2): qty 0.4

## 2016-12-06 MED ORDER — ONDANSETRON HCL 4 MG PO TABS: 4.0000 mg | ORAL_TABLET | Freq: Four times a day (QID) | ORAL | Status: DC | PRN

## 2016-12-06 NOTE — ED Provider Notes (Signed)
[facility] Emergency Department Provider Note   ____________________________________________   First MD Initiated Contact with Patient 12/06/16 1635     (approximate)  I have reviewed the triage vital signs and the nursing notes.   HISTORY  Chief Complaint Dysphagia    HPI IZAK ANDING is a 76 y.o. male Reports difficulty swallowing for 2 weeks. This been worsening last 3 days. He's not been able to swallow any food even water comes back up. Patient has no pain.    Past Medical History:  Diagnosis Date  . GERD (gastroesophageal reflux disease)     There are no active problems to display for this patient.   Past Surgical History:  Procedure Laterality Date  . CHOLECYSTECTOMY    . EYE SURGERY      Prior to Admission medications   Medication Sig Start Date End Date Taking? Authorizing Provider  aspirin EC 81 MG tablet Take by mouth.    [provider]  Multiple Vitamin (MULTI-VITAMINS) TABS Take by mouth.    [provider]  ranitidine (ZANTAC) 150 MG tablet Take by mouth.    [provider]  tamsulosin (FLOMAX) 0.4 MG CAPS capsule Take by mouth. 06/16/16 06/16/17  [provider]    Allergies Patient has no known allergies.  Family History  Problem Relation Age of Onset  . Prostate cancer Neg Hx   . Bladder Cancer Neg Hx   . Kidney cancer Neg Hx     Social History Social History  Substance Use Topics  . Smoking status: Never Smoker  . Smokeless tobacco: Never Used  . Alcohol use No    Review of Systems  Constitutional: No fever/chills Eyes: No visual changes. ENT: No sore throat. Cardiovascular: Denies chest pain. Respiratory: Denies shortness of breath. Gastrointestinal: No abdominal pain.  No nausea, no vomiting.  No diarrhea.  No constipation. Genitourinary: Negative for dysuria. Musculoskeletal: Negative for back pain. Skin: Negative for rash. Neurological: Negative for headaches,  focal weakness   ____________________________________________   PHYSICAL EXAM:  VITAL SIGNS: ED Triage Vitals  Enc Vitals Group     BP 12/06/16 1615 134/71     Pulse Rate 12/06/16 1610 91     Resp 12/06/16 1610 20     Temp 12/06/16 1610 98.8 F (37.1 C)     Temp Source 12/06/16 1610 Oral     SpO2 12/06/16 1610 98 %     Weight 12/06/16 1611 180 lb (81.6 kg)     Height 12/06/16 1611 5\' 9"  (1.753 m)     Head Circumference --      Peak Flow --      Pain Score 12/06/16 1609 0     Pain Loc --      Pain Edu? --      Excl. in [place]? --     Constitutional: Alert and oriented. Well appearing and in no acute distress. Eyes: Conjunctivae are normal. Head: Atraumatic. Nose: No congestion/rhinnorhea. Mouth/Throat: Mucous membranes are moist.  Oropharynx non-erythematous. Neck: No stridor.  Cardiovascular: Normal rate, regular rhythm. Grossly normal heart sounds.  Good peripheral circulation. Respiratory: Normal respiratory effort.  No retractions. Lungs CTAB. Gastrointestinal: Soft and nontender. No distention. No abdominal bruits. No CVA tenderness. Musculoskeletal: No lower extremity tenderness nor edema.  No joint effusions. Neurologic:  Normal speech and language. No gross focal neurologic deficits are appreciated. No gait instability. Skin:  Skin is warm, dry and intact. No rash noted. Psychiatric: Mood and affect are normal. Speech  and behavior are normal.  ____________________________________________   LABS (all labs ordered are listed, but only abnormal results are displayed)  Labs Reviewed - No data to display ____________________________________________  EKG  EKG read and interpreted by me shows normal sinus rhythm rate of 75 normal axis no acute changes ____________________________________________  RADIOLOGY  Dg Esophagus  Result Date: 12/06/2016 CLINICAL DATA:  Difficulty swallowing, constant vomiting EXAM: ESOPHOGRAM/BARIUM SWALLOW TECHNIQUE: Single contrast  examination was performed using  thick barium. FLUOROSCOPY TIME:  Fluoroscopy Time:  0.5 minute Radiation Exposure Index (if provided by the fluoroscopic device): 5.7 mGy Number of Acquired Spot Images: 0 COMPARISON:  None. FINDINGS: There was normal pharyngeal anatomy and motility. Patient is extremely nauseous. Mild esophageal dilatation with a high-grade stricture of the distal esophagus with irregular margins. IMPRESSION: 1. 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 Recommend upper endoscopy. Electronically Signed   By: Kathreen Devoid   On: 12/06/2016 11:53   x-rays reviewed with the hospitalist and myself and agree ____________________________________________   PROCEDURES  Procedure(s) performed:   Procedures  Critical Care performed:   ____________________________________________   INITIAL IMPRESSION / [place] / ED COURSE  Pertinent labs & imaging results that were available during my care of the patient were reviewed by me and considered in my medical decision making (see chart for details).   Will need gastroenterology to endoscopy the patient see if this is inflammatory or cancerous stricture     ____________________________________________   FINAL CLINICAL IMPRESSION(S) / ED DIAGNOSES  Final diagnoses:  Esophageal stricture      NEW MEDICATIONS STARTED DURING THIS VISIT:  New Prescriptions   No medications on file     Note:  This document was prepared using Dragon voice recognition software and may include unintentional dictation errors.    Nena Polio, MD 12/06/16 [phone]

## 2016-12-06 NOTE — ED Triage Notes (Signed)
Pt sent from dr sparks office.  Pt has diff swallowing for 2 weeks. Sx worse past 3 days.  Pt states my esophagus is closed up. Pt sent to er for eval and admission. Pt alert.  Speech clear.  No resp distress.

## 2016-12-06 NOTE — H&P (Signed)
[facility] NAME: Casey Acevedo    MR#:  [id number]  DATE OF BIRTH:  03/04/1941  DATE OF ADMISSION:  12/06/2016  PRIMARY CARE PHYSICIAN: Idelle Crouch, MD   REQUESTING/REFERRING PHYSICIAN: Dr. Conni Slipper  CHIEF COMPLAINT:   Chief Complaint  Patient presents with  . Dysphagia    HISTORY OF PRESENT ILLNESS:  Casey Acevedo  is a 76 y.o. male with no significant past medical history presents to hospital secondary to abnormal barium swallow study. Patient is very active and independent at baseline. Denies any known past medical history.e says he has noticed occasional trouble swallowing his food for the last couple of months. He was not persistent initially. Once in a while on certain foods he felt like he was having trouble to swallow. He started eating more soft diet for the last month. Swallowing gradually got worse in the last couple of weeks at which time he saw his PCP. Barium swallow study was ordered and he was started on ranitidine for the same. The last 3 days patient feels like his swallowing has gotten much worse to the point that is even unable to swallow liquids. Whatever he drinks when he leans forward is coming out. Denies any chest pain. He does have heartburn sensation on eating for the last couple of weeks. No family history of cancer. Denies any weight loss up until the last 3 days when he lost 7 pounds as he was not eating anything. No fevers, no malaise, no night sweats. No lymphadenopathy.  PAST MEDICAL HISTORY:   Past Medical History:  Diagnosis Date  . Dysphagia   . GERD (gastroesophageal reflux disease)     PAST SURGICAL HISTORY:   Past Surgical History:  Procedure Laterality Date  . CHOLECYSTECTOMY    . EYE SURGERY      SOCIAL HISTORY:   Social History  Substance Use Topics  . Smoking status: Never Smoker  . Smokeless tobacco: Never Used  . Alcohol use No    FAMILY HISTORY:   Family  History  Problem Relation Age of Onset  . Heart failure Father   . Prostate cancer Neg Hx   . Bladder Cancer Neg Hx   . Kidney cancer Neg Hx     DRUG ALLERGIES:  No Known Allergies  REVIEW OF SYSTEMS:   Review of Systems  Constitutional: Negative for chills, fever, malaise/fatigue and weight loss.  HENT: Negative for ear discharge, ear pain, hearing loss, nosebleeds and tinnitus.   Eyes: Negative for blurred vision, double vision and photophobia.  Respiratory: Negative for cough, hemoptysis, shortness of breath and wheezing.   Cardiovascular: Negative for chest pain, palpitations, orthopnea and leg swelling.  Gastrointestinal: Positive for heartburn and vomiting. Negative for abdominal pain, constipation, diarrhea, melena and nausea.       Dysphagia  Genitourinary: Negative for dysuria, frequency, hematuria and urgency.  Musculoskeletal: Negative for back pain, myalgias and neck pain.  Skin: Negative for rash.  Neurological: Negative for dizziness, tingling, tremors, sensory change, speech change, focal weakness and headaches.  Endo/Heme/Allergies: Does not bruise/bleed easily.  Psychiatric/Behavioral: Negative for depression.    MEDICATIONS AT HOME:   Prior to Admission medications   Medication Sig Start Date End Date Taking? Authorizing Provider  aspirin EC 81 MG tablet Take by mouth.   Yes [provider]  Multiple Vitamin (MULTI-VITAMINS) TABS Take by mouth.   Yes [provider]  ranitidine (ZANTAC) 150 MG tablet Take by mouth.  Yes [provider]      VITAL SIGNS:  Blood pressure 134/71, pulse 91, temperature 98.8 F (37.1 C), temperature source Oral, resp. rate 20, height 5\' 9"  (1.753 m), weight 81.6 kg (180 lb), SpO2 98 %.  PHYSICAL EXAMINATION:   Physical Exam  GENERAL:  76 y.o.-year-old patient lying in the bed with no acute distress.  EYES: Pupils equal, round, reactive to light and accommodation. No scleral icterus. Extraocular  muscles intact.  HEENT: Head atraumatic, normocephalic. Oropharynx and nasopharynx clear.  NECK:  Supple, no jugular venous distention. No thyroid enlargement, no tenderness. No lymphadenopathy on exam LUNGS: Normal breath sounds bilaterally, no wheezing, rales,rhonchi or crepitation. No use of accessory muscles of respiration.  CARDIOVASCULAR: S1, S2 normal. No murmurs, rubs, or gallops.  ABDOMEN: Soft, nontender, nondistended. Bowel sounds present. No organomegaly or mass.  EXTREMITIES: No pedal edema, cyanosis, or clubbing.  NEUROLOGIC: Cranial nerves II through XII are intact. Muscle strength 5/5 in all extremities. Sensation intact. Gait not checked.  PSYCHIATRIC: The patient is alert and oriented x 3.  SKIN: No obvious rash, lesion, or ulcer.   LABORATORY PANEL:   CBC No results for input(s): WBC, HGB, HCT, PLT in the last 168 hours. ------------------------------------------------------------------------------------------------------------------  Chemistries  No results for input(s): NA, K, CL, CO2, GLUCOSE, BUN, CREATININE, CALCIUM, MG, AST, ALT, ALKPHOS, BILITOT in the last 168 hours.  Invalid input(s): GFRCGP ------------------------------------------------------------------------------------------------------------------  Cardiac Enzymes No results for input(s): TROPONINI in the last 168 hours. ------------------------------------------------------------------------------------------------------------------  RADIOLOGY:  Dg Esophagus  Result Date: 12/06/2016 CLINICAL DATA:  Difficulty swallowing, constant vomiting EXAM: ESOPHOGRAM/BARIUM SWALLOW TECHNIQUE: Single contrast examination was performed using  thick barium. FLUOROSCOPY TIME:  Fluoroscopy Time:  0.5 minute Radiation Exposure Index (if provided by the fluoroscopic device): 5.7 mGy Number of Acquired Spot Images: 0 COMPARISON:  None. FINDINGS: There was normal pharyngeal anatomy and motility. Patient is extremely  nauseous. Mild esophageal dilatation with a high-grade stricture of the distal esophagus with irregular margins. IMPRESSION: 1. 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 Recommend upper endoscopy. Electronically Signed   By: Kathreen Devoid   On: 12/06/2016 11:53    EKG:   Orders placed or performed during the hospital encounter of 12/06/16  . ED EKG  . ED EKG    IMPRESSION AND PLAN:   Casey Acevedo  is a 76 y.o. male with no significant past medical history presents to hospital secondary to abnormal barium swallow study.  #1 dysphagia-barium swallow study showing a high-grade stricture of the distal esophagus. -We will admit, keep nothing by mouth. GI has been consulted -Endoscopy tomorrow and will also need biopsies to rule out malignancy. -Gentle IV fluids at this time -Further labs are pending and will manage accordingly  #2 DVT prophylaxis-on Lovenox    All the records are reviewed and case discussed with ED provider. Management plans discussed with the patient, family and they are in agreement.  CODE STATUS: full code  TOTAL TIME TAKING CARE OF THIS PATIENT: 50 minutes.    Gladstone Lighter M.D on 12/06/2016 at 5:21 PM  Between 7am to 6pm - Pager - [phone]  After 6pm go to [email or web address] - password [email or web address] [facility]  Office  [phone]  CC: Primary care physician; Idelle Crouch, MD

## 2016-12-07 ENCOUNTER — Other Ambulatory Visit: Payer: Self-pay | Admitting: Cardiothoracic Surgery

## 2016-12-07 ENCOUNTER — Observation Stay: Payer: Medicare Other | Admitting: Anesthesiology

## 2016-12-07 ENCOUNTER — Encounter: Payer: Self-pay | Admitting: Anesthesiology

## 2016-12-07 ENCOUNTER — Encounter
Admission: EM | Disposition: A | Discharge status: Home / Self Care | Payer: Self-pay | Source: Home / Self Care | Attending: Internal Medicine

## 2016-12-07 DIAGNOSIS — Z6826 Body mass index (BMI) 26.0-26.9, adult: Secondary | ICD-10-CM | POA: Diagnosis not present

## 2016-12-07 DIAGNOSIS — K219 Gastro-esophageal reflux disease without esophagitis: Secondary | ICD-10-CM | POA: Diagnosis present

## 2016-12-07 DIAGNOSIS — E43 Unspecified severe protein-calorie malnutrition: Secondary | ICD-10-CM | POA: Insufficient documentation

## 2016-12-07 DIAGNOSIS — Z9049 Acquired absence of other specified parts of digestive tract: Secondary | ICD-10-CM | POA: Diagnosis not present

## 2016-12-07 DIAGNOSIS — R197 Diarrhea, unspecified: Secondary | ICD-10-CM | POA: Diagnosis not present

## 2016-12-07 DIAGNOSIS — K222 Esophageal obstruction: Secondary | ICD-10-CM

## 2016-12-07 DIAGNOSIS — Z8249 Family history of ischemic heart disease and other diseases of the circulatory system: Secondary | ICD-10-CM | POA: Diagnosis not present

## 2016-12-07 DIAGNOSIS — R1310 Dysphagia, unspecified: Secondary | ICD-10-CM | POA: Diagnosis not present

## 2016-12-07 DIAGNOSIS — E162 Hypoglycemia, unspecified: Secondary | ICD-10-CM | POA: Diagnosis present

## 2016-12-07 DIAGNOSIS — Z7982 Long term (current) use of aspirin: Secondary | ICD-10-CM | POA: Diagnosis not present

## 2016-12-07 HISTORY — PX: ESOPHAGOGASTRODUODENOSCOPY (EGD) WITH PROPOFOL: SHX5813

## 2016-12-07 LAB — BASIC METABOLIC PANEL
ANION GAP: 10 (ref 5–15)
BUN: 15 mg/dL (ref 6–20)
CALCIUM: 8.7 mg/dL — AB (ref 8.9–10.3)
CO2: 22 mmol/L (ref 22–32)
Chloride: 109 mmol/L (ref 101–111)
Creatinine, Ser: 1.23 mg/dL (ref 0.61–1.24)
GFR, EST NON AFRICAN AMERICAN: 56 mL/min — AB (ref >60)
GLUCOSE: 62 mg/dL — AB (ref 65–99)
POTASSIUM: 3.9 mmol/L (ref 3.5–5.1)
Sodium: 141 mmol/L (ref 135–145)

## 2016-12-07 LAB — CBC
HEMATOCRIT: 43.1 % (ref 40.0–52.0)
HEMOGLOBIN: 14.7 g/dL (ref 13.0–18.0)
MCH: 30.1 pg (ref 26.0–34.0)
MCHC: 34 g/dL (ref 32.0–36.0)
MCV: 88.3 fL (ref 80.0–100.0)
Platelets: 204 10*3/uL (ref 150–440)
RBC: 4.88 MIL/uL (ref 4.40–5.90)
RDW: 13.9 % (ref 11.5–14.5)
WBC: 5.5 10*3/uL (ref 3.8–10.6)

## 2016-12-07 SURGERY — ESOPHAGOGASTRODUODENOSCOPY (EGD) WITH PROPOFOL
Anesthesia: General

## 2016-12-07 MED ORDER — DEXTROSE-NACL 5-0.9 % IV SOLN
INTRAVENOUS | Status: DC
  Administered 2016-12-07: 12:00:00 via INTRAVENOUS

## 2016-12-07 MED ORDER — SODIUM CHLORIDE 0.9 % IV SOLN
INTRAVENOUS | Status: DC | PRN
  Administered 2016-12-07: 13:00:00 via INTRAVENOUS

## 2016-12-07 MED ORDER — LIDOCAINE HCL (PF) 2 % IJ SOLN
INTRAMUSCULAR | Status: AC
  Filled 2016-12-07: qty 4

## 2016-12-07 MED ORDER — PROPOFOL 500 MG/50ML IV EMUL
INTRAVENOUS | Status: AC
  Filled 2016-12-07: qty 50

## 2016-12-07 MED ORDER — SODIUM CHLORIDE 0.9% FLUSH
10.0000 mL | Freq: Two times a day (BID) | INTRAVENOUS | Status: DC
  Administered 2016-12-07 – 2016-12-12 (×9): 10 mL

## 2016-12-07 MED ORDER — KETOROLAC TROMETHAMINE 30 MG/ML IJ SOLN
30.0000 mg | Freq: Four times a day (QID) | INTRAMUSCULAR | Status: DC | PRN
  Administered 2016-12-07 – 2016-12-11 (×4): 30 mg via INTRAVENOUS
  Filled 2016-12-07 (×4): qty 1

## 2016-12-07 MED ORDER — CLINIMIX/DEXTROSE (5/15) 5 % IV SOLN
INTRAVENOUS | Status: DC
  Filled 2016-12-07: qty 960

## 2016-12-07 MED ORDER — FENTANYL CITRATE (PF) 100 MCG/2ML IJ SOLN
INTRAMUSCULAR | Status: DC | PRN
  Administered 2016-12-07 (×2): 50 ug via INTRAVENOUS

## 2016-12-07 MED ORDER — SODIUM CHLORIDE 0.9% FLUSH: 10.0000 mL | INTRAVENOUS | Status: DC | PRN

## 2016-12-07 MED ORDER — LIDOCAINE 2% (20 MG/ML) 5 ML SYRINGE
INTRAMUSCULAR | Status: DC | PRN
  Administered 2016-12-07: 40 mg via INTRAVENOUS

## 2016-12-07 MED ORDER — PROPOFOL 10 MG/ML IV BOLUS
INTRAVENOUS | Status: DC | PRN
  Administered 2016-12-07: 100 mg via INTRAVENOUS

## 2016-12-07 MED ORDER — FENTANYL CITRATE (PF) 100 MCG/2ML IJ SOLN
INTRAMUSCULAR | Status: AC
  Filled 2016-12-07: qty 2

## 2016-12-07 MED ORDER — CLINIMIX/DEXTROSE (5/15) 5 % IV SOLN
Freq: Once | INTRAVENOUS | Status: AC
  Administered 2016-12-07: 21:00:00 via INTRAVENOUS
  Filled 2016-12-07: qty 960

## 2016-12-07 MED ORDER — PROPOFOL 500 MG/50ML IV EMUL
INTRAVENOUS | Status: DC | PRN
  Administered 2016-12-07: 150 ug/kg/min via INTRAVENOUS

## 2016-12-07 MED ORDER — MORPHINE SULFATE (PF) 2 MG/ML IV SOLN: 1.0000 mg | Freq: Once | INTRAVENOUS | Status: DC

## 2016-12-07 NOTE — Progress Notes (Signed)
PHARMACY - ADULT TOTAL PARENTERAL NUTRITION CONSULT NOTE   Pharmacy Consult for TPN Indication: esophageal stenosis  Patient Measurements: Height: 5\' 9"  (175.3 cm) Weight: 181 lb 1.6 oz (82.1 kg) (bed weight) IBW/kg (Calculated) : 70.7 TPN AdjBW (KG): 81.6 Body mass index is 26.74 kg/m. Usual Weight:   Assessment:   GI:  Endo:  Insulin requirements in the past 24 hours:  Lytes: Renal: Pulm: Cards:  Hepatobil: Neuro: ID:  Best Practices: TPN Access: TPN start date:  Nutritional Goals (per RD recommendation on 12/07/2016): kCal: Protein:   Current Nutrition:   Plan:  Start Clinimix 5/15 at 40 ml/hr (Order initiated 1600 to 2200 so basic TPN) IVF D5NS at 75 ml/hr currently ordered  Monitor TPN labs albumin, CBC, BMP, magnesium, phos, LFTs from 9/26, serum triglycerides F/U 12/08/2016  Laural Benes, Pharm.D., BCPS Clinical Pharmacist 12/07/2016,5:44 PM

## 2016-12-07 NOTE — H&P (Signed)
  Jonathon Bellows MD [address] [place], [place] [address] Phone: [phone] Fax : [phone]  Primary Care Physician:  Idelle Crouch, MD Primary Gastroenterologist:  Dr. Jonathon Bellows   Pre-Procedure History & Physical: HPI:  Casey Acevedo is a 76 y.o. male is here for an endoscopy.   Past Medical History:  Diagnosis Date  . Dysphagia   . GERD (gastroesophageal reflux disease)     Past Surgical History:  Procedure Laterality Date  . CHOLECYSTECTOMY    . EYE SURGERY      Prior to Admission medications   Medication Sig Start Date End Date Taking? Authorizing Provider  aspirin EC 81 MG tablet Take by mouth.   Yes [provider]  Multiple Vitamin (MULTI-VITAMINS) TABS Take by mouth.   Yes [provider]  ranitidine (ZANTAC) 150 MG tablet Take by mouth.   Yes [provider]    Allergies as of 12/06/2016  . (No Known Allergies)    Family History  Problem Relation Age of Onset  . Heart failure Father   . Prostate cancer Neg Hx   . Bladder Cancer Neg Hx   . Kidney cancer Neg Hx     Social History   Social History  . Marital status: Married    Spouse name: N/A  . Number of children: N/A  . Years of education: N/A   Occupational History  . Not on file.   Social History Main Topics  . Smoking status: Never Smoker  . Smokeless tobacco: Never Used  . Alcohol use No  . Drug use: No  . Sexual activity: Not on file   Other Topics Concern  . Not on file   Social History Narrative   Independent at baseline. Ambulatory    Review of Systems: See HPI, otherwise negative ROS  Physical Exam: BP 133/61 (BP Location: Left Arm)   Pulse 86   Temp 97.6 F (36.4 C) (Oral)   Resp 20   Ht 5\' 9"  (1.753 m)   Wt 180 lb (81.6 kg)   SpO2 94%   BMI 26.58 kg/m  General:   Alert,  pleasant and cooperative in NAD Head:  Normocephalic and atraumatic. Neck:  Supple; no masses or thyromegaly. Lungs:  Clear throughout to  auscultation.    Heart:  Regular rate and rhythm. Abdomen:  Soft, nontender and nondistended. Normal bowel sounds, without guarding, and without rebound.   Neurologic:  Alert and  oriented x4;  grossly normal neurologically.  Impression/Plan: Casey Acevedo is here for an endoscopy to be performed for dysphagia  Risks, benefits, limitations, and alternatives regarding  endoscopy and possible dilation  have been reviewed with the patient.  Questions have been answered.  All parties agreeable.   Jonathon Bellows, MD  12/07/2016, 12:37 PM

## 2016-12-07 NOTE — Progress Notes (Signed)
[place] [facility]                                                                                                                                                                                  Patient Demographics   Casey Acevedo, is a 76 y.o. male, DOB - March 09, 1941, MPN:[id number]  Admit date - 12/06/2016   Admitting Physician Gladstone Lighter, MD  Outpatient Primary MD for the patient is Doy Hutching Leonie Douglas, MD   LOS - 0  Subjective:  Patient admitted with dysphagia had a EGD showing malignant appearing Stricture   Review of Systems:   CONSTITUTIONAL: No documented fever. No fatigue, weakness. No weight gain, no weight loss.  EYES: No blurry or double vision.  ENT: No tinnitus. No postnasal drip. No redness of the oropharynx.  RESPIRATORY: No cough, no wheeze, no hemoptysis. No dyspnea.  CARDIOVASCULAR: No chest pain. No orthopnea. No palpitations. No syncope.  GASTROINTESTINAL: No nausea, no vomiting or diarrhea. No abdominal pain. No melena or hematochezia.  GENITOURINARY: No dysuria or hematuria.  ENDOCRINE: No polyuria or nocturia. No heat or cold intolerance.  HEMATOLOGY: No anemia. No bruising. No bleeding.  INTEGUMENTARY: No rashes. No lesions.  MUSCULOSKELETAL: No arthritis. No swelling. No gout.  NEUROLOGIC: No numbness, tingling, or ataxia. No seizure-type activity.  PSYCHIATRIC: No anxiety. No insomnia. No ADD.    Vitals:   Vitals:   12/07/16 1308 12/07/16 1309 12/07/16 1328 12/07/16 1338  BP: 108/66 108/66 130/69 131/69  Pulse:  85    Resp: 16 16    Temp: (!) 97.2 F (36.2 C) (!) 97.2 F (36.2 C)    TempSrc: Tympanic     SpO2:  99%    Weight:      Height:        Wt Readings from Last 3 Encounters:  12/06/16 180 lb (81.6 kg)  08/08/16 184 lb 4.8 oz (83.6 kg)     Intake/Output Summary (Last 24 hours) at 12/07/16 1426 Last data filed at 12/07/16 1249  Gross per 24 hour  Intake           806.25 ml  Output                 0 ml  Net           806.25 ml    Physical Exam:   GENERAL: Pleasant-appearing in no apparent distress.  HEAD, EYES, EARS, NOSE AND THROAT: Atraumatic, normocephalic. Extraocular muscles are intact. Pupils equal and reactive to light. Sclerae anicteric. No conjunctival injection. No oro-pharyngeal erythema.  NECK: Supple. There is no jugular venous distention. No bruits, no lymphadenopathy, no thyromegaly.  HEART: Regular  rate and rhythm,. No murmurs, no rubs, no clicks.  LUNGS: Clear to auscultation bilaterally. No rales or rhonchi. No wheezes.  ABDOMEN: Soft, flat, nontender, nondistended. Has good bowel sounds. No hepatosplenomegaly appreciated.  EXTREMITIES: No evidence of any cyanosis, clubbing, or peripheral edema.  +2 pedal and radial pulses bilaterally.  NEUROLOGIC: The patient is alert, awake, and oriented x3 with no focal motor or sensory deficits appreciated bilaterally.  SKIN: Moist and warm with no rashes appreciated.  Psych: Not anxious, depressed LN: No inguinal LN enlargement    Antibiotics   Anti-infectives    None      Medications   Scheduled Meds: . [MAR Hold] enoxaparin (LOVENOX) injection  40 mg Subcutaneous Q24H  . [MAR Hold]  morphine injection  1 mg Intravenous Once  . [MAR Hold] pantoprazole (PROTONIX) IV  40 mg Intravenous Q12H   Continuous Infusions: . dextrose 5 % and 0.9% NaCl 75 mL/hr at 12/07/16 1135   PRN Meds:.[MAR Hold] acetaminophen **OR** [MAR Hold] acetaminophen, [MAR Hold] ondansetron **OR** [MAR Hold] ondansetron (ZOFRAN) IV   Data Review:   Micro Results No results found for this or any previous visit (from the past 240 hour(s)).  Radiology Reports Dg Esophagus  Result Date: 12/06/2016 CLINICAL DATA:  Difficulty swallowing, constant vomiting EXAM: ESOPHOGRAM/BARIUM SWALLOW TECHNIQUE: Single contrast examination was performed using  thick barium. FLUOROSCOPY TIME:  Fluoroscopy Time:  0.5 minute Radiation Exposure Index (if  provided by the fluoroscopic device): 5.7 mGy Number of Acquired Spot Images: 0 COMPARISON:  None. FINDINGS: There was normal pharyngeal anatomy and motility. Patient is extremely nauseous. Mild esophageal dilatation with a high-grade stricture of the distal esophagus with irregular margins. IMPRESSION: 1. 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 Recommend upper endoscopy. Electronically Signed   By: Kathreen Devoid   On: 12/06/2016 11:53     CBC  Recent Labs Lab 12/06/16 1722 12/07/16 0521  WBC 6.5 5.5  HGB 15.4 14.7  HCT 45.3 43.1  PLT 237 204  MCV 88.7 88.3  MCH 30.2 30.1  MCHC 34.1 34.0  RDW 13.6 13.9  LYMPHSABS 2.1  --   MONOABS 0.5  --   EOSABS 0.1  --   BASOSABS 0.1  --     Chemistries   Recent Labs Lab 12/06/16 1722 12/07/16 0521  NA 138 141  K 3.9 3.9  CL 103 109  CO2 23 22  GLUCOSE 78 62*  BUN 14 15  CREATININE 1.11 1.23  CALCIUM 9.3 8.7*  AST 15  --   ALT 15*  --   ALKPHOS 63  --   BILITOT 1.0  --    ------------------------------------------------------------------------------------------------------------------ estimated creatinine clearance is 51.9 mL/min (by C-G formula based on SCr of 1.23 mg/dL). ------------------------------------------------------------------------------------------------------------------ No results for input(s): HGBA1C in the last 72 hours. ------------------------------------------------------------------------------------------------------------------ No results for input(s): CHOL, HDL, LDLCALC, TRIG, CHOLHDL, LDLDIRECT in the last 72 hours. ------------------------------------------------------------------------------------------------------------------ No results for input(s): TSH, T4TOTAL, T3FREE, THYROIDAB in the last 72 hours.  Invalid input(s):  FREET3 ------------------------------------------------------------------------------------------------------------------ No results for input(s): VITAMINB12, FOLATE, FERRITIN, TIBC, IRON, RETICCTPCT in the last 72 hours.  Coagulation profile  Recent Labs Lab 12/06/16 1722  INR 1.03    No results for input(s): DDIMER in the last 72 hours.  Cardiac Enzymes No results for input(s): CKMB, TROPONINI, MYOGLOBIN in the last 168 hours.  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           Consults  gi  DVT Prophylaxis  Lovenox   Lab Results  Component Value Date   PLT 204 12/07/2016     Time Spent in minutes  45min Greater than 50% of time spent in care coordination and counseling patient regarding the condition and plan of care.   Dustin Flock M.D on 12/07/2016 at 2:26 PM  Between 7am to 6pm - Pager - [phone]  After 6pm go to [email or web address] - password [email or web address] [facility]   Office  [phone]

## 2016-12-07 NOTE — Op Note (Signed)
[facility] Gastroenterology Patient Name: Casey Acevedo Procedure Date: 12/07/2016 12:43 PM MRN: [id number] Account #: [id number] Date of Birth: 03-10-1941 Admit Type: Inpatient Age: 76 Room: [facility] ENDO ROOM 4 Gender: Male Note Status: Finalized Procedure:            Upper GI endoscopy Indications:          Dysphagia Providers:            Jonathon Bellows MD, MD Referring MD:         Leonie Douglas. Doy Hutching, MD (Referring MD) Medicines:            Monitored Anesthesia Care Complications:        No immediate complications. Procedure:            Pre-Anesthesia Assessment:                       - Prior to the procedure, a History and Physical was                        performed, and patient medications, allergies and                        sensitivities were reviewed. The patient's tolerance of                        previous anesthesia was reviewed.                       - The risks and benefits of the procedure and the                        sedation options and risks were discussed with the                        patient. All questions were answered and informed                        consent was obtained.                       - ASA Grade Assessment: III - A patient with severe                        systemic disease.                       After obtaining informed consent, the endoscope was                        passed under direct vision. Throughout the procedure,                        the patient's blood pressure, pulse, and oxygen                        saturations were monitored continuously. The Endoscope                        was introduced through the mouth, and advanced to the  third part of duodenum. The patient tolerated the                        procedure well. The upper GI endoscopy was somewhat                        difficult due to stenosis. Successful completion of the                        procedure was aided by withdrawing the  scope and                        replacing with the pediatric endoscope. Findings:      The examined duodenum was normal.      The stomach was normal.      One severe malignant-appearing, intrinsic stenosis was found 36 cm from       the incisors. This measured 8 mm (inner diameter) x 2 cm (in length) and       was traversed after downsizing scope. Biopsies were taken with a cold       forceps for histology. I could not go through the stenosis with an adult       scope but was able to do so with a pediatric. The mucosa appeared       erythematous , 2 cm long at the stenosis level, abnormal appearing pit       pattern under NBI. Impression:           - Normal examined duodenum.                       - Normal stomach.                       - Malignant-appearing esophageal stenosis. Biopsied. Recommendation:       - Return patient to hospital ward for ongoing care.                       - 1. Unlikely to tolerate any solid foods due to                        stenosis                       2. Can try some clears if he can tolerate                       3. I have requested stat results from Pathology                       4. Consider TPN till we get other options to discuss                        about nutrition                       5. Based on biopsy report will decide on further tests                        including EUS, CT scans, and possible stenting for  dysphagia. Procedure Code(s):    --- Professional ---                       [phone], Esophagogastroduodenoscopy, flexible, transoral;                        with biopsy, single or multiple Diagnosis Code(s):    --- Professional ---                       K22.2, Esophageal obstruction                       R13.10, Dysphagia, unspecified CPT copyright 2016 American Medical Association. All rights reserved. The codes documented in this report are preliminary and upon coder review may  be revised to meet current  compliance requirements. Jonathon Bellows, MD Jonathon Bellows MD, MD 12/07/2016 1:08:11 PM This report has been signed electronically. Number of Addenda: 0 Note Initiated On: 12/07/2016 12:43 PM      [facility]

## 2016-12-07 NOTE — Progress Notes (Signed)
Peripherally Inserted Central Catheter/Midline Placement  The IV Nurse has discussed with the patient and/or persons authorized to consent for the patient, the purpose of this procedure and the potential benefits and risks involved with this procedure.  The benefits include less needle sticks, lab draws from the catheter, and the patient may be discharged home with the catheter. Risks include, but not limited to, infection, bleeding, blood clot (thrombus formation), and puncture of an artery; nerve damage and irregular heartbeat and possibility to perform a PICC exchange if needed/ordered by physician.  Alternatives to this procedure were also discussed.  Bard Power PICC patient education guide, fact sheet on infection prevention and patient information card has been provided to patient /or left at bedside.    PICC/Midline Placement Documentation  PICC Double Lumen 42/59/56 PICC Right Basilic 46 cm 0 cm (Active)  Indication for Insertion or Continuance of Line Administration of hyperosmolar/irritating solutions (i.e. TPN, Vancomycin, etc.) 12/07/2016  5:00 PM  Exposed Catheter (cm) 0 cm 12/07/2016  5:00 PM  Site Assessment Clean;Dry;Intact 12/07/2016  5:00 PM  Lumen #1 Status Flushed;Blood return noted 12/07/2016  5:00 PM  Lumen #2 Status Flushed;Blood return noted 12/07/2016  5:00 PM  Dressing Type Transparent 12/07/2016  5:00 PM  Dressing Status Clean;Dry;Intact;Antimicrobial disc in place 12/07/2016  5:00 PM  Dressing Intervention New dressing 12/07/2016  5:00 PM  Dressing Change Due 12/14/16 12/07/2016  5:00 PM       Jule Economy Horton 12/07/2016, 5:43 PM

## 2016-12-07 NOTE — Anesthesia Preprocedure Evaluation (Signed)
Anesthesia Evaluation  Patient identified by MRN, date of birth, ID band Patient awake    Reviewed: Allergy & Precautions, H&P , NPO status , Patient's Chart, lab work & pertinent test results  History of Anesthesia Complications Negative for: history of anesthetic complications  Airway Mallampati: III  TM Distance: <3 FB Neck ROM: limited    Dental  (+) Poor Dentition, Chipped   Pulmonary neg pulmonary ROS, neg shortness of breath,           Cardiovascular Exercise Tolerance: Good (-) angina(-) Past MI and (-) DOE negative cardio ROS       Neuro/Psych negative neurological ROS  negative psych ROS   GI/Hepatic Neg liver ROS, GERD  Medicated and Controlled,  Endo/Other  negative endocrine ROS  Renal/GU negative Renal ROS  negative genitourinary   Musculoskeletal   Abdominal   Peds  Hematology negative hematology ROS (+)   Anesthesia Other Findings Patient reports that he does that think that any food or pills are stuck in his throat at this time.  Past Medical History: No date: Dysphagia No date: GERD (gastroesophageal reflux disease)  Past Surgical History: No date: CHOLECYSTECTOMY No date: EYE SURGERY  BMI    Body Mass Index:  26.58 kg/m      Reproductive/Obstetrics negative OB ROS                             Anesthesia Physical Anesthesia Plan  ASA: III  Anesthesia Plan: General   Post-op Pain Management:    Induction: Intravenous  PONV Risk Score and Plan: Propofol infusion  Airway Management Planned: Natural Airway and Nasal Cannula  Additional Equipment:   Intra-op Plan:   Post-operative Plan:   Informed Consent: I have reviewed the patients History and Physical, chart, labs and discussed the procedure including the risks, benefits and alternatives for the proposed anesthesia with the patient or authorized representative who has indicated his/her  understanding and acceptance.   Dental Advisory Given  Plan Discussed with: Anesthesiologist, CRNA and Surgeon  Anesthesia Plan Comments: (Patient consented for risks of anesthesia including but not limited to:  - adverse reactions to medications - risk of intubation if required - damage to teeth, lips or other oral mucosa - sore throat or hoarseness - Damage to heart, brain, lungs or loss of life  Patient voiced understanding.)        Anesthesia Quick Evaluation

## 2016-12-07 NOTE — Transfer of Care (Signed)
Immediate Anesthesia Transfer of Care Note  Patient: Casey Acevedo  Procedure(s) Performed: Procedure(s): ESOPHAGOGASTRODUODENOSCOPY (EGD) WITH PROPOFOL (N/A)  Patient Location: PACU and Endoscopy Unit  Anesthesia Type:General  Level of Consciousness: drowsy  Airway & Oxygen Therapy: Patient Spontanous Breathing and Patient connected to nasal cannula oxygen  Post-op Assessment: Report given to RN and Post -op Vital signs reviewed and stable  Post vital signs: Reviewed and stable  Last Vitals:  Vitals:   12/07/16 0421 12/07/16 0900  BP: 125/65 133/61  Pulse: 82 86  Resp: 20 20  Temp: 36.7 C 36.4 C  SpO2: 94% 94%    Last Pain:  Vitals:   12/07/16 0900  TempSrc: Oral  PainSc: 0-No pain         Complications: No apparent anesthesia complications

## 2016-12-07 NOTE — Anesthesia Post-op Follow-up Note (Signed)
Anesthesia QCDR form completed.        

## 2016-12-07 NOTE — Progress Notes (Signed)
Initial Nutrition Assessment  DOCUMENTATION CODES:   Severe malnutrition in context of acute illness/injury  INTERVENTION:   Clinimix 5/20 with electrolytes- Initiate at 65m/hr x 24hrs 20% Lipids '@20ml'$ /hr x 12 hrs Add MVI   Regimen provides 1345kcal/day, 49g/day protein, 12223mvolume  Pt at high refeeding risk- recommend monitor P, K, and Mg for 3 days after TPN initiation   Recommend '100mg'$  thiamine daily for 3 days  Recommend discontinue IVF  Daily weights  NUTRITION DIAGNOSIS:   Malnutrition (severe) related to acute illness, other (see comment) (esophageal dysphagia ) as evidenced by energy intake < or equal to 50% for > or equal to 5 days, severe depletion of muscle mass, per patient/family report.  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 Pt reports that for the past several months he had been noticing that when he would swallow certain foods, they would get stuck in his throat. Pt reports that eventually, once he had relaxed, the food would continue to go down. Starting about three weeks ago, pt reports that more and more food was beginning to get stuck and eventually he was able to drink only just liquids; pt has been consuming only liquids for the past 3 weeks. For the past week, pt reports that dysphagia has gotten so severe to the point where he is unable to eat or drink anything. Pt reports that he can not even suck on ice chips without getting nauseas and feeling like they are going to come back up. 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 Pt has now been without adequate nutrition for 3 weeks and meets criteria for  severe malnutrition. Per chart, pt has remained weight stable. Spoke to MD, plan is to initiate TPN in the interim while awaiting biopsy results. If needed, and biopsies come back malignant, would recommend IR placement of J-G tube for long term feeding. Using the GI system for enteral feeding helps prevent gut mucosal atrophy, reduces septic complications by decreasing bacterial translocation, stimulates gut motility therefore reducing the risk of ileus, and enhances the intestinal immune system. Pt is at high refeeding risk; recommend monitor P, K, and Mg for 3 days after TPN initiation.      Medications reviewed and include: lovenox, morphine, protonix, NaCl w/ 5% dextrose '@75ml'$ /hr, zofran  Labs reviewed: Ca 8.7(L) Glucose- 78, 62 x 24hrs  Nutrition-Focused physical exam completed. Findings are no fat depletion, severe muscle depletions in hands and LE, and no edema.   Diet Order:  Diet NPO time specified  Skin:  Reviewed, no issues  Last BM:  9/24  Height:   Ht Readings from Last 1 Encounters:  12/06/16 '5\' 9"'$  (1.753 m)    Weight:   Wt Readings from Last 1 Encounters:  12/07/16 181 lb 1.6 oz (82.1 kg)    Ideal Body Weight:  72.7 kg  BMI:  Body mass index is 26.74 kg/m.  Estimated Nutritional Needs:   Kcal:  1900-2200kcal/day   Protein:  90-106g/day   Fluid:  >2L/day   EDUCATION NEEDS:   Education needs addressed  CaKoleen DistanceS, RD, LDN Pager #- [phone] Hours  Pager: [phone]  

## 2016-12-07 NOTE — Anesthesia Postprocedure Evaluation (Signed)
Anesthesia Post Note  Patient: Casey Acevedo  Procedure(s) Performed: Procedure(s) (LRB): ESOPHAGOGASTRODUODENOSCOPY (EGD) WITH PROPOFOL (N/A)  Patient location during evaluation: Endoscopy Anesthesia Type: General Level of consciousness: awake and alert Pain management: pain level controlled Vital Signs Assessment: post-procedure vital signs reviewed and stable Respiratory status: spontaneous breathing, nonlabored ventilation, respiratory function stable and patient connected to nasal cannula oxygen Cardiovascular status: blood pressure returned to baseline and stable Postop Assessment: no apparent nausea or vomiting Anesthetic complications: no     Last Vitals:  Vitals:   12/07/16 1338 12/07/16 1440  BP: 131/69   Pulse:    Resp:  20  Temp:    SpO2:  100%    Last Pain:  Vitals:   12/07/16 1308  TempSrc: Tympanic  PainSc:                  [name]

## 2016-12-07 NOTE — Consult Note (Signed)
Jonathon Bellows MD, MRCP(U.K) [address]  [place]  [place], [place] [address]  Main: [phone]  Fax: [phone]  Consultation  Referring Provider:  Dr Tressia Miners  Primary Care Physician:  Idelle Crouch, MD Primary Gastroenterologist:  None          Reason for Consultation:     Dysphagia  Date of Admission:  12/06/2016 Date of Consultation:  12/07/2016         HPI:   Casey Acevedo is a 76 y.o. male He was admitted last evening after an abnormal barium study. He was seen by his doctor recently for difficulty swallowing which has got worse over the last few months. Subsequently seen by Dr Doy Hutching and Barium swallow ordered which demonstrated mild esophageal dilation with a high grade stricture of the distal esophagus suggestive of a stricture but malignancy cannot be excluded  .  He says that the issues with swallowing has been ongoing since last 3 weeks and had absolutely no issues before that . He says that at this time having difficulty swallowing solids and liquids, no h/o smoking alcohol abuse, family history of esophageal cancer. No significant weight loss..    Past Medical History:  Diagnosis Date  . Dysphagia   . GERD (gastroesophageal reflux disease)     Past Surgical History:  Procedure Laterality Date  . CHOLECYSTECTOMY    . EYE SURGERY      Prior to Admission medications   Medication Sig Start Date End Date Taking? Authorizing Provider  aspirin EC 81 MG tablet Take by mouth.   Yes [provider]  Multiple Vitamin (MULTI-VITAMINS) TABS Take by mouth.   Yes [provider]  ranitidine (ZANTAC) 150 MG tablet Take by mouth.   Yes [provider]    Family History  Problem Relation Age of Onset  . Heart failure Father   . Prostate cancer Neg Hx   . Bladder Cancer Neg Hx   . Kidney cancer Neg Hx      Social History  Substance Use Topics  . Smoking status: Never Smoker  . Smokeless tobacco: Never Used  . Alcohol use No      Allergies as of 12/06/2016  . (No Known Allergies)    Review of Systems:    All systems reviewed and negative except where noted in HPI.   Physical Exam:  Vital signs in last 24 hours: Temp:  [97.6 F (36.4 C)-98.8 F (37.1 C)] 97.6 F (36.4 C) (09/27 0900) Pulse Rate:  [77-91] 86 (09/27 0900) Resp:  [17-22] 20 (09/27 0900) BP: (125-160)/(61-91) 133/61 (09/27 0900) SpO2:  [94 %-100 %] 94 % (09/27 0900) Weight:  [180 lb (81.6 kg)] 180 lb (81.6 kg) (09/26 1611) Last BM Date: 12/04/16 General:   Pleasant, cooperative in NAD Head:  Normocephalic and atraumatic. Eyes:   No icterus.   Conjunctiva pink. PERRLA. Ears:  Normal auditory acuity. Neck:  Supple; no masses or thyroidomegaly Lungs: Respirations even and unlabored. Lungs clear to auscultation bilaterally.   No wheezes, crackles, or rhonchi.  Heart:  Regular rate and rhythm;  Without murmur, clicks, rubs or gallops Abdomen:  Soft, nondistended, nontender. Normal bowel sounds. No appreciable masses or hepatomegaly.  No rebound or guarding.  Rectal:  Not performed. Neurologic:  Alert and oriented x3;  grossly normal neurologically. Skin:  Intact without significant lesions or rashes. Cervical Nodes:  No significant cervical adenopathy. Psych:  Alert and cooperative. Normal affect.  LAB RESULTS:  Recent Labs  12/06/16  1722 12/07/16 0521  WBC 6.5 5.5  HGB 15.4 14.7  HCT 45.3 43.1  PLT 237 204   BMET  Recent Labs  12/06/16 1722 12/07/16 0521  NA 138 141  K 3.9 3.9  CL 103 109  CO2 23 22  GLUCOSE 78 62*  BUN 14 15  CREATININE 1.11 1.23  CALCIUM 9.3 8.7*   LFT  Recent Labs  12/06/16 1722  PROT 7.7  ALBUMIN 4.4  AST 15  ALT 15*  ALKPHOS 63  BILITOT 1.0   PT/INR  Recent Labs  12/06/16 1722  LABPROT 13.4  INR 1.03    STUDIES: Dg Esophagus  Result Date: 12/06/2016 CLINICAL DATA:  Difficulty swallowing, constant vomiting EXAM: ESOPHOGRAM/BARIUM SWALLOW TECHNIQUE: Single contrast examination  was performed using  thick barium. FLUOROSCOPY TIME:  Fluoroscopy Time:  0.5 minute Radiation Exposure Index (if provided by the fluoroscopic device): 5.7 mGy Number of Acquired Spot Images: 0 COMPARISON:  None. FINDINGS: There was normal pharyngeal anatomy and motility. Patient is extremely nauseous. Mild esophageal dilatation with a high-grade stricture of the distal esophagus with irregular margins. IMPRESSION: 1. 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 Recommend upper endoscopy. Electronically Signed   By: Kathreen Devoid   On: 12/06/2016 11:53      Impression / Plan:   Casey Acevedo is a 76 y.o. y/o male with dysphagia and a barium swallow suggestive of a stricture.   Plan   1. EGD+/- dilation   I have discussed alternative options, risks & benefits,  which include, but are not limited to, bleeding, infection, perforation,respiratory complication & drug reaction.  The patient agrees with this plan & written consent will be obtained.    Thank you for involving me in the care of this patient.      LOS: 0 days   Jonathon Bellows, MD  12/07/2016, 12:39 PM

## 2016-12-07 NOTE — Care Management Obs Status (Signed)
[facility] NOTIFICATION   Patient Details  Name: Casey Acevedo MRN: [id number] Date of Birth: 14-Mar-1940   Medicare Observation Status Notification Given:  Yes    Beverly Sessions, RN 12/07/2016, 4:05 PM

## 2016-12-07 NOTE — Progress Notes (Signed)
Medications administered by student RN [id number] with supervision of Clinical Instructor Cason Luffman MSN, RN-BC or patient's assigned RN.   

## 2016-12-08 ENCOUNTER — Encounter: Payer: Self-pay | Admitting: Gastroenterology

## 2016-12-08 DIAGNOSIS — R1310 Dysphagia, unspecified: Secondary | ICD-10-CM

## 2016-12-08 LAB — GLUCOSE, CAPILLARY
GLUCOSE-CAPILLARY: 137 mg/dL — AB (ref 65–99)
GLUCOSE-CAPILLARY: 129 mg/dL — AB (ref 65–99)
GLUCOSE-CAPILLARY: 126 mg/dL — AB (ref 65–99)
Glucose-Capillary: 121 mg/dL — ABNORMAL HIGH (ref 65–99)
Glucose-Capillary: 124 mg/dL — ABNORMAL HIGH (ref 65–99)
Glucose-Capillary: 158 mg/dL — ABNORMAL HIGH (ref 65–99)

## 2016-12-08 LAB — CBC
HCT: 40.6 % (ref 40.0–52.0)
Hemoglobin: 13.9 g/dL (ref 13.0–18.0)
MCH: 30.6 pg (ref 26.0–34.0)
MCHC: 34.2 g/dL (ref 32.0–36.0)
MCV: 89.4 fL (ref 80.0–100.0)
PLATELETS: 196 10*3/uL (ref 150–440)
RBC: 4.54 MIL/uL (ref 4.40–5.90)
RDW: 13.9 % (ref 11.5–14.5)
WBC: 5.2 10*3/uL (ref 3.8–10.6)

## 2016-12-08 LAB — BASIC METABOLIC PANEL
ANION GAP: 3 — AB (ref 5–15)
BUN: 18 mg/dL (ref 6–20)
CHLORIDE: 112 mmol/L — AB (ref 101–111)
CO2: 24 mmol/L (ref 22–32)
Calcium: 8.4 mg/dL — ABNORMAL LOW (ref 8.9–10.3)
Creatinine, Ser: 1.12 mg/dL (ref 0.61–1.24)
Glucose, Bld: 137 mg/dL — ABNORMAL HIGH (ref 65–99)
POTASSIUM: 3.9 mmol/L (ref 3.5–5.1)
SODIUM: 139 mmol/L (ref 135–145)

## 2016-12-08 LAB — ALBUMIN: ALBUMIN: 3.5 g/dL (ref 3.5–5.0)

## 2016-12-08 LAB — PHOSPHORUS: Phosphorus: 3.1 mg/dL (ref 2.5–4.6)

## 2016-12-08 LAB — MAGNESIUM: MAGNESIUM: 1.9 mg/dL (ref 1.7–2.4)

## 2016-12-08 MED ORDER — INSULIN ASPART 100 UNIT/ML ~~LOC~~ SOLN
0.0000 [IU] | SUBCUTANEOUS | Status: DC
  Filled 2016-12-08: qty 1

## 2016-12-08 MED ORDER — FAT EMULSION 20 % IV EMUL
250.0000 mL | INTRAVENOUS | Status: AC
  Administered 2016-12-08: 250 mL via INTRAVENOUS
  Filled 2016-12-08: qty 250

## 2016-12-08 MED ORDER — THIAMINE HCL 100 MG/ML IJ SOLN
100.0000 mg | Freq: Every day | INTRAMUSCULAR | Status: AC
  Administered 2016-12-08 – 2016-12-10 (×3): 100 mg via INTRAVENOUS
  Filled 2016-12-08 (×3): qty 1

## 2016-12-08 MED ORDER — M.V.I. ADULT IV INJ
INTRAVENOUS | Status: AC
  Administered 2016-12-08: 21:00:00 via INTRAVENOUS
  Filled 2016-12-08: qty 1992

## 2016-12-08 NOTE — Progress Notes (Signed)
   Jonathon Bellows MD, MRCP(U.K) [address]  [place]  [place], [place] [address]  Main: [phone]    Casey Acevedo is being followed for dysphagia  Day 1 of follow up   Subjective: No new concerns,on TPN   Objective: Vital signs in last 24 hours: Vitals:   12/07/16 2010 12/08/16 0437 12/08/16 0500 12/08/16 0910  BP: 129/64 115/64  114/63  Pulse: 85 72  64  Resp: 20 18  16   Temp: 97.9 F (36.6 C) 98 F (36.7 C)    TempSrc: Oral Oral    SpO2: 96% 96%  96%  Weight:   179 lb 4.8 oz (81.3 kg)   Height:       Weight change: 1 lb 1.6 oz (0.499 kg)  Intake/Output Summary (Last 24 hours) at 12/08/16 1233 Last data filed at 12/08/16 0423  Gross per 24 hour  Intake           841.25 ml  Output                0 ml  Net           841.25 ml     Exam: Heart:: Regular rate and rhythm, S1S2 present or without murmur or extra heart sounds Lungs: normal, clear to auscultation and clear to auscultation and percussion Abdomen: soft, nontender, normal bowel sounds   Lab Results: @LABTEST2 @ Micro Results: No results found for this or any previous visit (from the past 240 hour(s)). Studies/Results: No results found. Medications: I have reviewed the patient's current medications. Scheduled Meds: . enoxaparin (LOVENOX) injection  40 mg Subcutaneous Q24H  . insulin aspart  0-9 Units Subcutaneous Q4H  .  morphine injection  1 mg Intravenous Once  . pantoprazole (PROTONIX) IV  40 mg Intravenous Q12H  . sodium chloride flush  10-40 mL Intracatheter Q12H  . thiamine injection  100 mg Intravenous Daily   Continuous Infusions: . Marland KitchenTPN (CLINIMIX-E) Adult     And  . fat emulsion    . TPN (CLINIMIX) Adult without lytes 40 mL/hr at 12/07/16 2038   PRN Meds:.acetaminophen **OR** acetaminophen, ketorolac, ondansetron **OR** ondansetron (ZOFRAN) IV, sodium chloride flush   Assessment: Active Problems:   Dysphagia   Protein-calorie malnutrition, severe   Esophageal  stricture   Casey Acevedo is being followed for dyshagia. EGD yesterday showed a tight possibly malignant appearing stricture above the GE junction. Awaiting biopsies  Plan: 1. Continue TPN 2. Based on the biopsy results either would need a imaging/EUS followed by stent vs radiation surgery - will make recommendations based on bx report.    LOS: 1 day   Jonathon Bellows 12/08/2016, 12:33 PM

## 2016-12-08 NOTE — Progress Notes (Signed)
VSS. Pt has been understandably a tad irritable and sarcastic today, but otherwise mood unchanged from yesterday. Blood glucose slightly elevated today in the 120's. Refused SSI. Resting comfortably in room now.

## 2016-12-08 NOTE — Progress Notes (Signed)
PHARMACY - ADULT TOTAL PARENTERAL NUTRITION CONSULT NOTE   Pharmacy Consult for TPN Indication: esophageal stenosis  Patient Measurements: Height: 5\' 9"  (175.3 cm) Weight: 179 lb 4.8 oz (81.3 kg) IBW/kg (Calculated) : 70.7 TPN AdjBW (KG): 81.6 Body mass index is 26.48 kg/m. Usual Weight:   Assessment: 76 yo male admitted with h/o dysphagia for months and severe malnutrition. EGD showing possible malignant stricture. Patient is at high refeeding risk    GI: LBM: 9/24 Endo: BGs: 78, 62, 137  Insulin requirements in the past 24 hours:  Lytes:K+: 3.9   Phos: 3.1   Mg: 1.9 Renal:Scr:1.12, CrCl: 54ml/min  Best Practices:PICC placement 9/27 TPN Access:9/27 TPN start date:9/27  Nutritional Goals (per RD recommendation on 12/07/2016): 2234ml volume KCal: 2233kcal/day Protein: 100g/day   Current Nutrition:   Plan:  Start  Clinimix 5/20 with electrolytes- Increase TPN to goal rate today of 32ml/hr x 24hrs Add 20% Lipids @20ml /hr x 12 hrs Add MVI  Thiamine 100mg  IV  daily for 3 days ordered per RD recommendation  SSI insulin and CBGs ordered Monitor TPN labs albumin, CBC, BMP, magnesium, phos, LFTs, serum triglycerides  F/U daily   Cabrini Ruggieri M Zarius Furr, Pharm.D., BCPS Clinical Pharmacist 12/08/2016,10:25 AM

## 2016-12-08 NOTE — Progress Notes (Signed)
[place] [facility]                                                                                                                                                                                  Patient Demographics   Casey Acevedo, is a 76 y.o. male, DOB - Jan 06, 1941, [id number]  Admit date - 12/06/2016   Admitting Physician Gladstone Lighter, MD  Outpatient Primary MD for the patient is Sparks, Leonie Douglas, MD   LOS - 1  Subjective: Currently has no symptoms has intermittent headaches   Review of Systems:   CONSTITUTIONAL: No documented fever. No fatigue, weakness. No weight gain, no weight loss.  EYES: No blurry or double vision.  ENT: No tinnitus. No postnasal drip. No redness of the oropharynx.  RESPIRATORY: No cough, no wheeze, no hemoptysis. No dyspnea.  CARDIOVASCULAR: No chest pain. No orthopnea. No palpitations. No syncope.  GASTROINTESTINAL: No nausea, no vomiting or diarrhea. No abdominal pain. No melena or hematochezia.  GENITOURINARY: No dysuria or hematuria.  ENDOCRINE: No polyuria or nocturia. No heat or cold intolerance.  HEMATOLOGY: No anemia. No bruising. No bleeding.  INTEGUMENTARY: No rashes. No lesions.  MUSCULOSKELETAL: No arthritis. No swelling. No gout.  NEUROLOGIC: No numbness, tingling, or ataxia. No seizure-type activity.  PSYCHIATRIC: No anxiety. No insomnia. No ADD.    Vitals:   Vitals:   12/07/16 2010 12/08/16 0437 12/08/16 0500 12/08/16 0910  BP: 129/64 115/64  114/63  Pulse: 85 72  64  Resp: 20 18  16   Temp: 97.9 F (36.6 C) 98 F (36.7 C)    TempSrc: Oral Oral    SpO2: 96% 96%  96%  Weight:   179 lb 4.8 oz (81.3 kg)   Height:        Wt Readings from Last 3 Encounters:  12/08/16 179 lb 4.8 oz (81.3 kg)  08/08/16 184 lb 4.8 oz (83.6 kg)     Intake/Output Summary (Last 24 hours) at 12/08/16 1359 Last data filed at 12/08/16 0423  Gross per 24 hour  Intake           641.25 ml  Output                 0 ml  Net           641.25 ml    Physical Exam:   GENERAL: Pleasant-appearing in no apparent distress.  HEAD, EYES, EARS, NOSE AND THROAT: Atraumatic, normocephalic. Extraocular muscles are intact. Pupils equal and reactive to light. Sclerae anicteric. No conjunctival injection. No oro-pharyngeal erythema.  NECK: Supple. There is no jugular venous distention. No bruits, no lymphadenopathy, no thyromegaly.  HEART: Regular  rate and rhythm,. No murmurs, no rubs, no clicks.  LUNGS: Clear to auscultation bilaterally. No rales or rhonchi. No wheezes.  ABDOMEN: Soft, flat, nontender, nondistended. Has good bowel sounds. No hepatosplenomegaly appreciated.  EXTREMITIES: No evidence of any cyanosis, clubbing, or peripheral edema.  +2 pedal and radial pulses bilaterally.  NEUROLOGIC: The patient is alert, awake, and oriented x3 with no focal motor or sensory deficits appreciated bilaterally.  SKIN: Moist and warm with no rashes appreciated.  Psych: Not anxious, depressed LN: No inguinal LN enlargement    Antibiotics   Anti-infectives    None      Medications   Scheduled Meds: . enoxaparin (LOVENOX) injection  40 mg Subcutaneous Q24H  . insulin aspart  0-9 Units Subcutaneous Q4H  .  morphine injection  1 mg Intravenous Once  . pantoprazole (PROTONIX) IV  40 mg Intravenous Q12H  . sodium chloride flush  10-40 mL Intracatheter Q12H  . thiamine injection  100 mg Intravenous Daily   Continuous Infusions: . Marland KitchenTPN (CLINIMIX-E) Adult     And  . fat emulsion    . TPN (CLINIMIX) Adult without lytes 40 mL/hr at 12/07/16 2038   PRN Meds:.acetaminophen **OR** acetaminophen, ketorolac, ondansetron **OR** ondansetron (ZOFRAN) IV, sodium chloride flush   Data Review:   Micro Results No results found for this or any previous visit (from the past 240 hour(s)).  Radiology Reports Dg Esophagus  Result Date: 12/06/2016 CLINICAL DATA:  Difficulty swallowing, constant vomiting EXAM:  ESOPHOGRAM/BARIUM SWALLOW TECHNIQUE: Single contrast examination was performed using  thick barium. FLUOROSCOPY TIME:  Fluoroscopy Time:  0.5 minute Radiation Exposure Index (if provided by the fluoroscopic device): 5.7 mGy Number of Acquired Spot Images: 0 COMPARISON:  None. FINDINGS: There was normal pharyngeal anatomy and motility. Patient is extremely nauseous. Mild esophageal dilatation with a high-grade stricture of the distal esophagus with irregular margins. IMPRESSION: 1. 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 Recommend upper endoscopy. Electronically Signed   By: Kathreen Devoid   On: 12/06/2016 11:53     CBC  Recent Labs Lab 12/06/16 1722 12/07/16 0521 12/08/16 0543  WBC 6.5 5.5 5.2  HGB 15.4 14.7 13.9  HCT 45.3 43.1 40.6  PLT 237 204 196  MCV 88.7 88.3 89.4  MCH 30.2 30.1 30.6  MCHC 34.1 34.0 34.2  RDW 13.6 13.9 13.9  LYMPHSABS 2.1  --   --   MONOABS 0.5  --   --   EOSABS 0.1  --   --   BASOSABS 0.1  --   --     Chemistries   Recent Labs Lab 12/06/16 1722 12/07/16 0521 12/08/16 0543  NA 138 141 139  K 3.9 3.9 3.9  CL 103 109 112*  CO2 23 22 24   GLUCOSE 78 62* 137*  BUN 14 15 18   CREATININE 1.11 1.23 1.12  CALCIUM 9.3 8.7* 8.4*  MG  --   --  1.9  AST 15  --   --   ALT 15*  --   --   ALKPHOS 63  --   --   BILITOT 1.0  --   --    ------------------------------------------------------------------------------------------------------------------ estimated creatinine clearance is 57 mL/min (by C-G formula based on SCr of 1.12 mg/dL). ------------------------------------------------------------------------------------------------------------------ No results for input(s): HGBA1C in the last 72 hours. ------------------------------------------------------------------------------------------------------------------ No results for input(s): CHOL, HDL,  LDLCALC, TRIG, CHOLHDL, LDLDIRECT in the last 72 hours. ------------------------------------------------------------------------------------------------------------------ No results  for input(s): TSH, T4TOTAL, T3FREE, THYROIDAB in the last 72 hours.  Invalid input(s): FREET3 ------------------------------------------------------------------------------------------------------------------ No results for input(s): VITAMINB12, FOLATE, FERRITIN, TIBC, IRON, RETICCTPCT in the last 72 hours.  Coagulation profile  Recent Labs Lab 12/06/16 1722  INR 1.03    No results for input(s): DDIMER in the last 72 hours.  Cardiac Enzymes No results for input(s): CKMB, TROPONINI, MYOGLOBIN in the last 168 hours.  Invalid input(s): CK ------------------------------------------------------------------------------------------------------------------ Invalid input(s): POCBNP    Assessment & Plan   Active Problems:  *Dysphagia : due to malignant appearing stricture Status post biopsy, once biopsy results available patient will need either stent or further therapy if it is malignant He is started on TPN  * hypoglycemia now resolved  *GERD continue PPI  * miscellaneous Lovenox for DVT prophylaxis      Code Status Orders        Start     Ordered   12/06/16 1924  Full code  Continuous     12/06/16 1923    Code Status History    Date Active Date Inactive Code Status Order ID Comments User Context   This patient has a current code status but no historical code status.           Consults  gi  DVT Prophylaxis  Lovenox   Lab Results  Component Value Date   PLT 196 12/08/2016     Time Spent in minutes  90min Greater than 50% of time spent in care coordination and counseling patient regarding the condition and plan of care.   Dustin Flock M.D on 12/08/2016 at 1:59 PM  Between 7am to 6pm - Pager - [phone]  After 6pm go to [email or web address] - password [email or web address] [place]  [facility]   Office  [phone]

## 2016-12-08 NOTE — Progress Notes (Signed)
Nutrition Follow Up Note   DOCUMENTATION CODES:   Severe malnutrition in context of acute illness/injury  INTERVENTION:   Clinimix 5/20 with electrolytes- Increase TPN to goal rate of 50ml/hr x 24hrs Add 20% Lipids @20ml /hr x 12 hrs Add MVI   Regimen provides 2233kcal/day, 100g/day protein, 2260ml volume  Pt at high refeeding risk- recommend monitor P, K, and Mg for 3 days after TPN administration   Recommend 100mg  thiamine daily for 3 days  Recommend discontinue IVF  Daily weights  NUTRITION DIAGNOSIS:   Malnutrition (severe) related to acute illness, other (see comment) (esophageal dysphagia ) as evidenced by energy intake < or equal to 50% for > or equal to 5 days, severe depletion of muscle mass, per patient/family report.  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 Pt initiated on basic TPN (no electrolytes) yesterday due to TPN being ordered late in the day. Recommend change TPN to clinimix 5/20 and advance to goal rate. Continue to monitor K, P, and Mg for at least two more days. Daily weights. Will monitor for new plan of care once biopsies return.      Medications reviewed and include: lovenox, morphine, protonix  Labs reviewed: Cl 112(H), Ca 8.4(L), P 3.1 wnl, Mg 1.9 wnl Glucose- 78, 62, 137 x 48hrs  Diet Order:  Diet NPO time specified  Skin:  Reviewed, no issues  Last BM:  9/24  Height:   Ht Readings from Last 1 Encounters:  12/06/16 5\' 9"  (1.753 m)    Weight:   Wt Readings from Last 1 Encounters:  12/08/16 179 lb 4.8 oz (81.3 kg)    Ideal Body Weight:  72.7 kg  BMI:  Body mass index is 26.48 kg/m.  Estimated Nutritional Needs:   Kcal:  1900-2200kcal/day   Protein:  90-106g/day    Fluid:  >2L/day   EDUCATION NEEDS:   Education needs addressed  Koleen Distance MS, RD, LDN Pager #[phone] After Hours Pager: [phone]

## 2016-12-09 DIAGNOSIS — K222 Esophageal obstruction: Principal | ICD-10-CM

## 2016-12-09 LAB — ALBUMIN: ALBUMIN: 3.6 g/dL (ref 3.5–5.0)

## 2016-12-09 LAB — GLUCOSE, CAPILLARY
GLUCOSE-CAPILLARY: 123 mg/dL — AB (ref 65–99)
GLUCOSE-CAPILLARY: 132 mg/dL — AB (ref 65–99)
Glucose-Capillary: 125 mg/dL — ABNORMAL HIGH (ref 65–99)
Glucose-Capillary: 132 mg/dL — ABNORMAL HIGH (ref 65–99)
Glucose-Capillary: 120 mg/dL — ABNORMAL HIGH (ref 65–99)

## 2016-12-09 LAB — BASIC METABOLIC PANEL
Anion gap: 6 (ref 5–15)
BUN: 18 mg/dL (ref 6–20)
CALCIUM: 8.4 mg/dL — AB (ref 8.9–10.3)
CO2: 23 mmol/L (ref 22–32)
CREATININE: 0.97 mg/dL (ref 0.61–1.24)
Chloride: 111 mmol/L (ref 101–111)
GFR calc Af Amer: >60 mL/min (ref >60)
Glucose, Bld: 138 mg/dL — ABNORMAL HIGH (ref 65–99)
Potassium: 3.4 mmol/L — ABNORMAL LOW (ref 3.5–5.1)
SODIUM: 140 mmol/L (ref 135–145)

## 2016-12-09 LAB — PHOSPHORUS: Phosphorus: 3.2 mg/dL (ref 2.5–4.6)

## 2016-12-09 LAB — MAGNESIUM: MAGNESIUM: 1.7 mg/dL (ref 1.7–2.4)

## 2016-12-09 LAB — TRIGLYCERIDES: Triglycerides: 186 mg/dL — ABNORMAL HIGH (ref <150)

## 2016-12-09 MED ORDER — FAT EMULSION 20 % IV EMUL
250.0000 mL | INTRAVENOUS | Status: AC
  Administered 2016-12-09: 18:00:00 250 mL via INTRAVENOUS
  Filled 2016-12-09: qty 250

## 2016-12-09 MED ORDER — TRACE MINERALS CR-CU-MN-SE-ZN 10-1000-500-60 MCG/ML IV SOLN
INTRAVENOUS | Status: AC
  Administered 2016-12-09: 18:00:00 via INTRAVENOUS
  Filled 2016-12-09: qty 1992

## 2016-12-09 MED ORDER — POTASSIUM CHLORIDE 10 MEQ/50ML IV SOLN
10.0000 meq | INTRAVENOUS | Status: AC
  Administered 2016-12-09 (×3): 10 meq via INTRAVENOUS
  Filled 2016-12-09 (×4): qty 50

## 2016-12-09 MED ORDER — MAGNESIUM SULFATE 2 GM/50ML IV SOLN
2.0000 g | Freq: Once | INTRAVENOUS | Status: AC
  Administered 2016-12-09: 10:00:00 2 g via INTRAVENOUS
  Filled 2016-12-09 (×2): qty 50

## 2016-12-09 NOTE — Progress Notes (Signed)
[place] at [facility]                                                                                                                                                                                  Patient Demographics   Casey Acevedo, is a 76 y.o. male, DOB - [birth year], ZJI:[id number]  Admit date - 12/06/2016   Admitting Physician Gladstone Lighter, MD  Outpatient Primary MD for the patient is Idelle Crouch, MD   LOS - 2  Subjective: Currently has no symptoms  Eager to find out about  Biopsy results.tolrating TPN>  Review of Systems:   CONSTITUTIONAL: No documented fever. No fatigue, weakness. No weight gain, no weight loss.  EYES: No blurry or double vision.  ENT: No tinnitus. No postnasal drip. No redness of the oropharynx.  RESPIRATORY: No cough, no wheeze, no hemoptysis. No dyspnea.  CARDIOVASCULAR: No chest pain. No orthopnea. No palpitations. No syncope.  GASTROINTESTINAL: No nausea, no vomiting or diarrhea. No abdominal pain. No melena or hematochezia.  GENITOURINARY: No dysuria or hematuria.  ENDOCRINE: No polyuria or nocturia. No heat or cold intolerance.  HEMATOLOGY: No anemia. No bruising. No bleeding.  INTEGUMENTARY: No rashes. No lesions.  MUSCULOSKELETAL: No arthritis. No swelling. No gout.  NEUROLOGIC: No numbness, tingling, or ataxia. No seizure-type activity.  PSYCHIATRIC: No anxiety. No insomnia. No ADD.    Vitals:   Vitals:   12/08/16 0910 12/08/16 1448 12/08/16 1927 12/09/16 0416  BP: 114/63 133/73 (!) 142/74 134/73  Pulse: 64 69 66 66  Resp: 16  20 20   Temp:  98.3 F (36.8 C) 98.2 F (36.8 C) 98 F (36.7 C)  TempSrc:  Oral Oral   SpO2: 96% 95% 96% 94%  Weight:    81 kg (178 lb 9.6 oz)  Height:        Wt Readings from Last 3 Encounters:  12/09/16 81 kg (178 lb 9.6 oz)  08/08/16 83.6 kg (184 lb 4.8 oz)     Intake/Output Summary (Last 24 hours) at 12/09/16 1036 Last data filed at 12/09/16 0317  Gross per 24  hour  Intake            959.8 ml  Output                0 ml  Net            959.8 ml    Physical Exam:   GENERAL: Pleasant-appearing in no apparent distress.  HEAD, EYES, EARS, NOSE AND THROAT: Atraumatic, normocephalic. Extraocular muscles are intact. Pupils equal and reactive to light. Sclerae anicteric. No conjunctival injection. No oro-pharyngeal erythema.  NECK: Supple. There is no  jugular venous distention. No bruits, no lymphadenopathy, no thyromegaly.  HEART: Regular rate and rhythm,. No murmurs, no rubs, no clicks.  LUNGS: Clear to auscultation bilaterally. No rales or rhonchi. No wheezes.  ABDOMEN: Soft, flat, nontender, nondistended. Has good bowel sounds. No hepatosplenomegaly appreciated.  EXTREMITIES: No evidence of any cyanosis, clubbing, or peripheral edema.  +2 pedal and radial pulses bilaterally.  NEUROLOGIC: The patient is alert, awake, and oriented x3 with no focal motor or sensory deficits appreciated bilaterally.  SKIN: Moist and warm with no rashes appreciated.  Psych: Not anxious, depressed LN: No inguinal LN enlargement    Antibiotics   Anti-infectives    None      Medications   Scheduled Meds: . enoxaparin (LOVENOX) injection  40 mg Subcutaneous Q24H  . insulin aspart  0-9 Units Subcutaneous Q4H  .  morphine injection  1 mg Intravenous Once  . pantoprazole (PROTONIX) IV  40 mg Intravenous Q12H  . sodium chloride flush  10-40 mL Intracatheter Q12H  . thiamine injection  100 mg Intravenous Daily   Continuous Infusions: . Marland KitchenTPN (CLINIMIX-E) Adult    . fat emulsion    . magnesium sulfate 1 - 4 g bolus IVPB 2 g (12/09/16 1008)  . potassium chloride 10 mEq (12/09/16 1008)  . Marland KitchenTPN (CLINIMIX-E) Adult 83 mL/hr at 12/08/16 2121   PRN Meds:.acetaminophen **OR** acetaminophen, ketorolac, ondansetron **OR** ondansetron (ZOFRAN) IV, sodium chloride flush   Data Review:   Micro Results No results found for this or any previous visit (from the past 240  hour(s)).  Radiology Reports Dg Esophagus  Result Date: 12/06/2016 CLINICAL DATA:  Difficulty swallowing, constant vomiting EXAM: ESOPHOGRAM/BARIUM SWALLOW TECHNIQUE: Single contrast examination was performed using  thick barium. FLUOROSCOPY TIME:  Fluoroscopy Time:  0.5 minute Radiation Exposure Index (if provided by the fluoroscopic device): 5.7 mGy Number of Acquired Spot Images: 0 COMPARISON:  None. FINDINGS: There was normal pharyngeal anatomy and motility. Patient is extremely nauseous. Mild esophageal dilatation with a high-grade stricture of the distal esophagus with irregular margins. IMPRESSION: 1. 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 Recommend upper endoscopy. Electronically Signed   By: Kathreen Devoid   On: 12/06/2016 11:53     CBC  Recent Labs Lab 12/06/16 1722 12/07/16 0521 12/08/16 0543  WBC 6.5 5.5 5.2  HGB 15.4 14.7 13.9  HCT 45.3 43.1 40.6  PLT 237 204 196  MCV 88.7 88.3 89.4  MCH 30.2 30.1 30.6  MCHC 34.1 34.0 34.2  RDW 13.6 13.9 13.9  LYMPHSABS 2.1  --   --   MONOABS 0.5  --   --   EOSABS 0.1  --   --   BASOSABS 0.1  --   --     Chemistries   Recent Labs Lab 12/06/16 1722 12/07/16 0521 12/08/16 0543 12/09/16 0546  NA 138 141 139 140  K 3.9 3.9 3.9 3.4*  CL 103 109 112* 111  CO2 23 22 24 23   GLUCOSE 78 62* 137* 138*  BUN 14 15 18 18   CREATININE 1.11 1.23 1.12 0.97  CALCIUM 9.3 8.7* 8.4* 8.4*  MG  --   --  1.9 1.7  AST 15  --   --   --   ALT 15*  --   --   --   ALKPHOS 63  --   --   --   BILITOT 1.0  --   --   --    ------------------------------------------------------------------------------------------------------------------  estimated creatinine clearance is 65.8 mL/min (by C-G formula based on SCr of 0.97  mg/dL). ------------------------------------------------------------------------------------------------------------------ No results for input(s): HGBA1C in the last 72 hours. ------------------------------------------------------------------------------------------------------------------  Recent Labs  12/09/16 0546  TRIG 186*   ------------------------------------------------------------------------------------------------------------------ No results for input(s): TSH, T4TOTAL, T3FREE, THYROIDAB in the last 72 hours.  Invalid input(s): FREET3 ------------------------------------------------------------------------------------------------------------------ No results for input(s): VITAMINB12, FOLATE, FERRITIN, TIBC, IRON, RETICCTPCT in the last 72 hours.  Coagulation profile  Recent Labs Lab 12/06/16 1722  INR 1.03    No results for input(s): DDIMER in the last 72 hours.  Cardiac Enzymes No results for input(s): CKMB, TROPONINI, MYOGLOBIN in the last 168 hours.  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           Consults  gi  DVT Prophylaxis  Lovenox   Lab Results  Component Value Date   PLT 196 12/08/2016     Time Spent in minutes   30min Greater than 50% of time spent in care coordination and counseling patient regarding the condition and plan of care.   Epifanio Lesches M.D on 12/09/2016 at 10:36 AM  Between 7am to 6pm - Pager - [phone]  After 6pm go to [email or web address] - password [email or web address] [facility]   Office  [phone]

## 2016-12-09 NOTE — Progress Notes (Signed)
  Lucilla Lame, MD [facility]   [address], [place], [place] [address] Phone: [phone] Fax : [phone]   Subjective: The patient seems anxious about stay in the hospital and awaiting the pathology results.  The patient has no complaints today except that he would like to go home.   Objective: Vital signs in last 24 hours: Vitals:   12/08/16 1448 12/08/16 1927 12/09/16 0416 12/09/16 1407  BP: 133/73 (!) 142/74 134/73 127/74  Pulse: 69 66 66 69  Resp:  20 20   Temp: 98.3 F (36.8 C) 98.2 F (36.8 C) 98 F (36.7 C) 97.6 F (36.4 C)  TempSrc: Oral Oral  Oral  SpO2: 95% 96% 94% 97%  Weight:   178 lb 9.6 oz (81 kg)   Height:       Weight change: -2 lb 8 oz (-1.134 kg)  Intake/Output Summary (Last 24 hours) at 12/09/16 1609 Last data filed at 12/09/16 0317  Gross per 24 hour  Intake           680.47 ml  Output                0 ml  Net           680.47 ml     Exam: Alert and orientated 3 in no apparent distress normal affect.   Lab Results: @LABTEST2 @ Micro Results: No results found for this or any previous visit (from the past 240 hour(s)). Studies/Results: No results found. Medications: I have reviewed the patient's current medications. Scheduled Meds: . enoxaparin (LOVENOX) injection  40 mg Subcutaneous Q24H  . insulin aspart  0-9 Units Subcutaneous Q4H  .  morphine injection  1 mg Intravenous Once  . pantoprazole (PROTONIX) IV  40 mg Intravenous Q12H  . sodium chloride flush  10-40 mL Intracatheter Q12H  . thiamine injection  100 mg Intravenous Daily   Continuous Infusions: . Marland KitchenTPN (CLINIMIX-E) Adult    . fat emulsion    . Marland KitchenTPN (CLINIMIX-E) Adult 83 mL/hr at 12/08/16 2121   PRN Meds:.acetaminophen **OR** acetaminophen, ketorolac, ondansetron **OR** ondansetron (ZOFRAN) IV, sodium chloride flush   Assessment: Active Problems:   Dysphagia   Protein-calorie malnutrition, severe   Esophageal stricture    Plan: The patient was explained that  we are awaiting his biopsy results.  Spoke to the patient for approximately 35 minutes explaining the process of waiting for the results and possible outcomes if the biopsies are malignant or benign.  The patient states he understands the plan.   LOS: 2 days   Lucilla Lame 12/09/2016, 4:09 PM

## 2016-12-09 NOTE — Progress Notes (Signed)
PHARMACY - ADULT TOTAL PARENTERAL NUTRITION CONSULT NOTE   Pharmacy Consult for TPN Indication: esophageal stenosis  Patient Measurements: Height: 5\' 9"  (175.3 cm) Weight: 178 lb 9.6 oz (81 kg) IBW/kg (Calculated) : 70.7 TPN AdjBW (KG): 81.6 Body mass index is 26.37 kg/m. Usual Weight:   Assessment: 76 yo male admitted with h/o dysphagia for months and severe malnutrition. EGD showing possible malignant stricture. Patient is at high refeeding risk    GI: LBM: 9/24 Endo: 121-158 Insulin requirements in the past 24 hours: 0 units Lytes:Potassium: 3.4   Phos: 3.2   Mg: 1.7 Renal:CrCl: 73ml/min  Best Practices:PICC placement 9/27 TPN Access:9/27 TPN start date:9/27  Nutritional Goals (per RD recommendation on 12/07/2016): 2223ml volume KCal: 2233kcal/day Protein: 100g/day   Current Nutrition: Clinimix 5/20 at goal rate of 73mL/hr.   Plan:  Will replace potassium 54mEq IV x 3 and magnesium 2g IV x 1.  Continue Clinimix 5/20 with electrolytes at 82ml/hr x 24hrs Continue 20% Lipids @20ml /hr x 12 hrs Continue MVI Thiamine 100mg  IV  daily for 3 days ordered per RD recommendation  SSI insulin and CBGs ordered Monitor TPN labs albumin, CBC, BMP, magnesium, phos, LFTs, serum triglycerides  F/U daily   Cameryn Chrisley L 12/09/2016,8:52 AM

## 2016-12-10 LAB — ALBUMIN: ALBUMIN: 3.4 g/dL — AB (ref 3.5–5.0)

## 2016-12-10 LAB — GLUCOSE, CAPILLARY
GLUCOSE-CAPILLARY: 118 mg/dL — AB (ref 65–99)
GLUCOSE-CAPILLARY: 122 mg/dL — AB (ref 65–99)
GLUCOSE-CAPILLARY: 135 mg/dL — AB (ref 65–99)
Glucose-Capillary: 114 mg/dL — ABNORMAL HIGH (ref 65–99)
Glucose-Capillary: 123 mg/dL — ABNORMAL HIGH (ref 65–99)

## 2016-12-10 LAB — BASIC METABOLIC PANEL
ANION GAP: 6 (ref 5–15)
BUN: 22 mg/dL — ABNORMAL HIGH (ref 6–20)
CALCIUM: 8.5 mg/dL — AB (ref 8.9–10.3)
CO2: 23 mmol/L (ref 22–32)
Chloride: 112 mmol/L — ABNORMAL HIGH (ref 101–111)
Creatinine, Ser: 0.84 mg/dL (ref 0.61–1.24)
Glucose, Bld: 130 mg/dL — ABNORMAL HIGH (ref 65–99)
POTASSIUM: 3.8 mmol/L (ref 3.5–5.1)
Sodium: 141 mmol/L (ref 135–145)

## 2016-12-10 LAB — PHOSPHORUS: PHOSPHORUS: 3.9 mg/dL (ref 2.5–4.6)

## 2016-12-10 LAB — MAGNESIUM: MAGNESIUM: 1.9 mg/dL (ref 1.7–2.4)

## 2016-12-10 MED ORDER — M.V.I. ADULT IV INJ
INTRAVENOUS | Status: DC
  Filled 2016-12-10: qty 1992

## 2016-12-10 MED ORDER — TRACE MINERALS CR-CU-MN-SE-ZN 10-1000-500-60 MCG/ML IV SOLN
INTRAVENOUS | Status: AC
  Administered 2016-12-10: 17:00:00 via INTRAVENOUS
  Filled 2016-12-10: qty 1992

## 2016-12-10 MED ORDER — FAT EMULSION 20 % IV EMUL
250.0000 mL | INTRAVENOUS | Status: AC
  Administered 2016-12-10: 17:00:00 250 mL via INTRAVENOUS
  Filled 2016-12-10: qty 250

## 2016-12-10 MED ORDER — MAGNESIUM SULFATE 2 GM/50ML IV SOLN
2.0000 g | Freq: Once | INTRAVENOUS | Status: AC
  Administered 2016-12-10: 09:00:00 2 g via INTRAVENOUS
  Filled 2016-12-10: qty 50

## 2016-12-10 NOTE — Progress Notes (Signed)
Refused evening CBG

## 2016-12-10 NOTE — Progress Notes (Signed)
PHARMACY - ADULT TOTAL PARENTERAL NUTRITION CONSULT NOTE   Pharmacy Consult for TPN Indication: esophageal stenosis  Patient Measurements: Height: 5\' 9"  (175.3 cm) Weight: 179 lb (81.2 kg) IBW/kg (Calculated) : 70.7 TPN AdjBW (KG): 81.6 Body mass index is 26.43 kg/m.   Assessment: 76 yo male admitted with h/o dysphagia for months and severe malnutrition. EGD showing possible malignant stricture. Patient is at high refeeding risk    GI: LBM: 9/24 Endo: 114-132 Insulin requirements in the past 24 hours: 0 units Lytes:Potassium: 3.8   Phos: 3.9   Mg: 1.9 Renal:CrCl: 76 mL/min  Best Practices:PICC placement 9/27 TPN Access:9/27 TPN start date:9/27  Nutritional Goals (per RD recommendation on 12/07/2016): 2235ml volume KCal: 2233kcal/day Protein: 100g/day   Current Nutrition: Clinimix 5/20 at goal rate of 26mL/hr.   Plan:  Will replace  magnesium 2g IV x 1.  Continue Clinimix 5/20 with electrolytes at 66ml/hr x 24hrs Continue 20% Lipids @20ml /hr x 12 hrs Continue MVI SSI insulin and CBGs ordered Monitor TPN labs albumin, CBC, BMP, magnesium, phos, LFTs, serum triglycerides  F/U daily   Mayling Aber L 12/10/2016,12:19 PM

## 2016-12-11 ENCOUNTER — Inpatient Hospital Stay: Payer: Medicare Other

## 2016-12-11 ENCOUNTER — Encounter: Payer: Self-pay | Admitting: Radiology

## 2016-12-11 LAB — COMPREHENSIVE METABOLIC PANEL
ALK PHOS: 52 U/L (ref 38–126)
ALT: 14 U/L — AB (ref 17–63)
AST: 12 U/L — ABNORMAL LOW (ref 15–41)
Albumin: 3.4 g/dL — ABNORMAL LOW (ref 3.5–5.0)
Anion gap: 5 (ref 5–15)
BUN: 28 mg/dL — ABNORMAL HIGH (ref 6–20)
CALCIUM: 8.4 mg/dL — AB (ref 8.9–10.3)
CO2: 24 mmol/L (ref 22–32)
Chloride: 111 mmol/L (ref 101–111)
Creatinine, Ser: 0.91 mg/dL (ref 0.61–1.24)
Glucose, Bld: 132 mg/dL — ABNORMAL HIGH (ref 65–99)
Potassium: 3.8 mmol/L (ref 3.5–5.1)
Sodium: 140 mmol/L (ref 135–145)
Total Bilirubin: 0.5 mg/dL (ref 0.3–1.2)
Total Protein: 6.2 g/dL — ABNORMAL LOW (ref 6.5–8.1)

## 2016-12-11 LAB — TRIGLYCERIDES: Triglycerides: 92 mg/dL (ref <150)

## 2016-12-11 LAB — CBC
HEMATOCRIT: 42.4 % (ref 40.0–52.0)
Hemoglobin: 14.6 g/dL (ref 13.0–18.0)
MCH: 30.6 pg (ref 26.0–34.0)
MCHC: 34.5 g/dL (ref 32.0–36.0)
MCV: 88.8 fL (ref 80.0–100.0)
Platelets: 175 10*3/uL (ref 150–440)
RBC: 4.78 MIL/uL (ref 4.40–5.90)
RDW: 13.7 % (ref 11.5–14.5)
WBC: 7.1 10*3/uL (ref 3.8–10.6)

## 2016-12-11 LAB — DIFFERENTIAL
BASOS ABS: 0.1 10*3/uL (ref 0–0.1)
BASOS PCT: 1 %
EOS ABS: 0.3 10*3/uL (ref 0–0.7)
EOS PCT: 5 %
Lymphocytes Relative: 24 %
Lymphs Abs: 1.7 10*3/uL (ref 1.0–3.6)
MONOS PCT: 12 %
Monocytes Absolute: 0.8 10*3/uL (ref 0.2–1.0)
Neutro Abs: 4.2 10*3/uL (ref 1.4–6.5)
Neutrophils Relative %: 58 %

## 2016-12-11 LAB — GLUCOSE, CAPILLARY
GLUCOSE-CAPILLARY: 124 mg/dL — AB (ref 65–99)
GLUCOSE-CAPILLARY: 119 mg/dL — AB (ref 65–99)
Glucose-Capillary: 122 mg/dL — ABNORMAL HIGH (ref 65–99)
Glucose-Capillary: 118 mg/dL — ABNORMAL HIGH (ref 65–99)
Glucose-Capillary: 126 mg/dL — ABNORMAL HIGH (ref 65–99)

## 2016-12-11 LAB — C DIFFICILE QUICK SCREEN W PCR REFLEX
C Diff antigen: POSITIVE — AB
C Diff toxin: NEGATIVE

## 2016-12-11 LAB — SURGICAL PATHOLOGY

## 2016-12-11 LAB — CLOSTRIDIUM DIFFICILE BY PCR: Toxigenic C. Difficile by PCR: NEGATIVE

## 2016-12-11 LAB — PHOSPHORUS: PHOSPHORUS: 4.5 mg/dL (ref 2.5–4.6)

## 2016-12-11 LAB — MAGNESIUM: MAGNESIUM: 2.1 mg/dL (ref 1.7–2.4)

## 2016-12-11 IMAGING — CT CT ABD-PELV W/ CM
2 of 5 series · 12 of 36 positions shown, 15 images · IV contrast (iopamidol)
Comparison: CT abdomen pelvis [DATE]

CLINICAL DATA: Patient with progressive difficulty swallowing.

EXAM:
CT CHEST, ABDOMEN, AND PELVIS WITH CONTRAST
TECHNIQUE: Multidetector CT imaging of the chest, abdomen and pelvis was
performed following the standard protocol during bolus
administration of intravenous contrast.
CONTRAST:  100mL [42] IOPAMIDOL ([42]) INJECTION 61%

[Series 2: cap with · axial · 0.81mm/px · z∈[-628,-93]mm · 9 of 135 slices shown, 12 images]
[im 14/135  mediastinal]
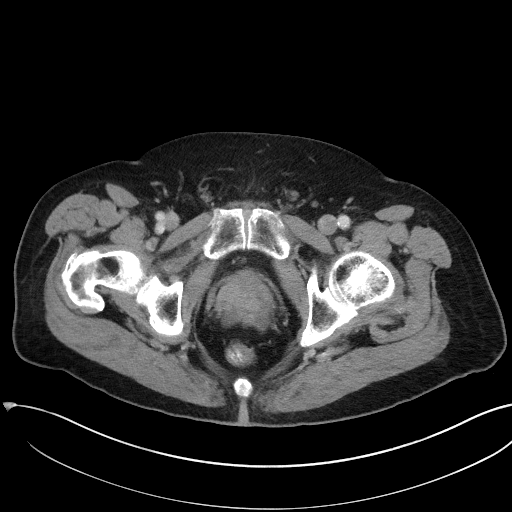
[im 14/135  lung]
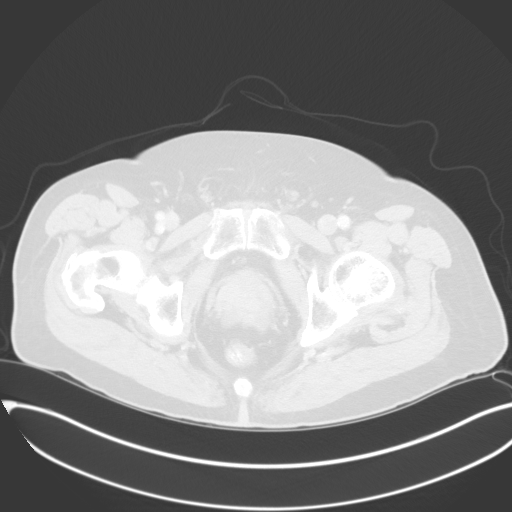
[im 27/135  lung]
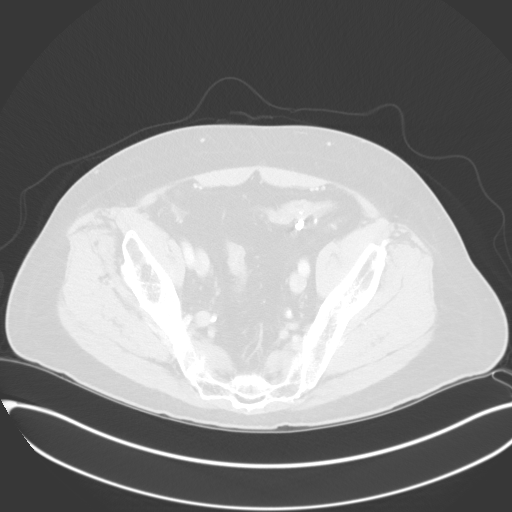
[im 41/135  lung]
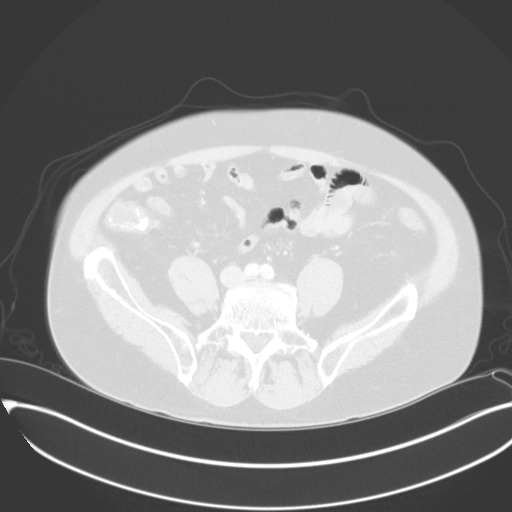
[im 54/135  lung]
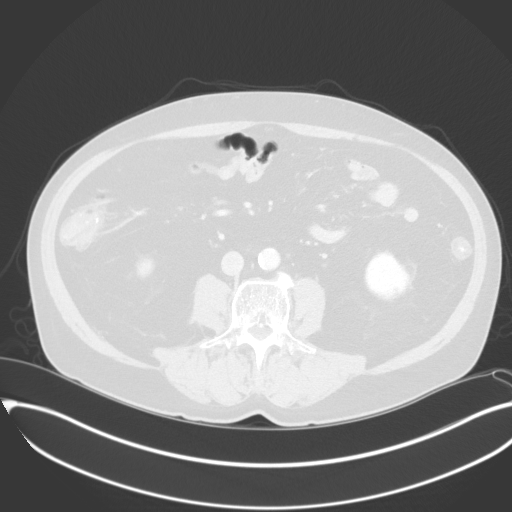
[im 68/135  mediastinal]
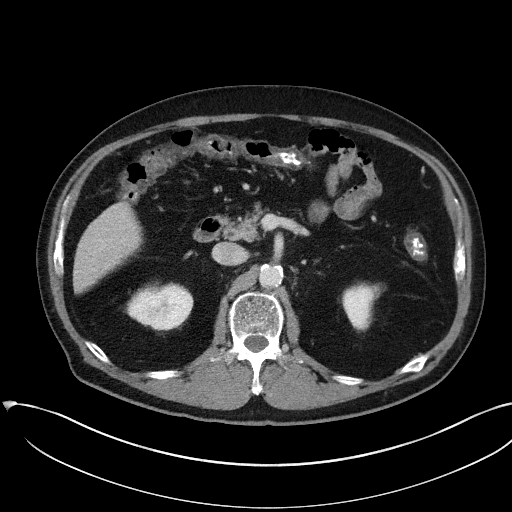
[im 68/135  lung]
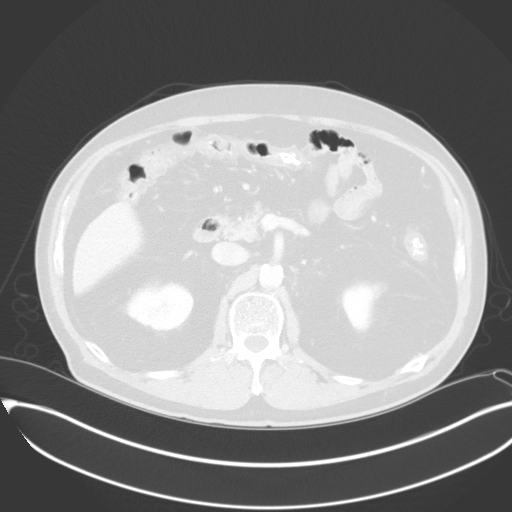
[im 81/135  lung]
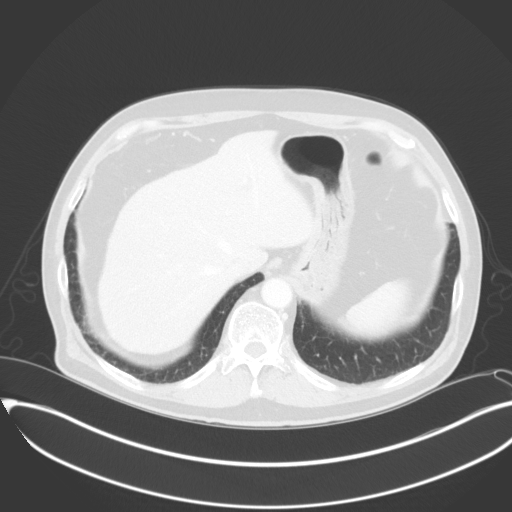
[im 94/135  lung]
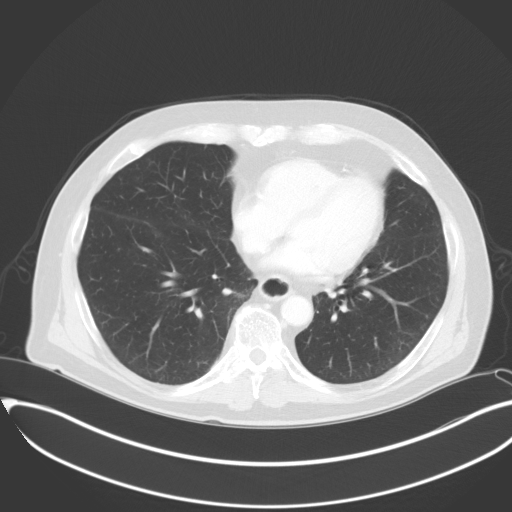
[im 108/135  lung]
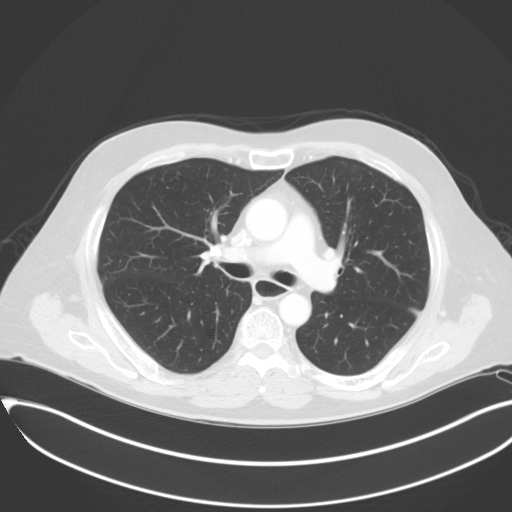
[im 121/135  mediastinal]
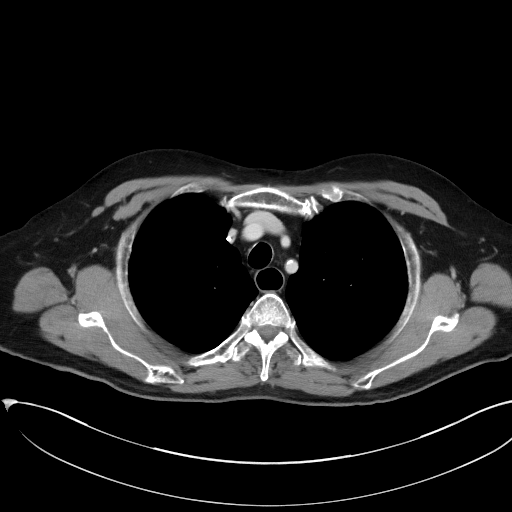
[im 121/135  lung]
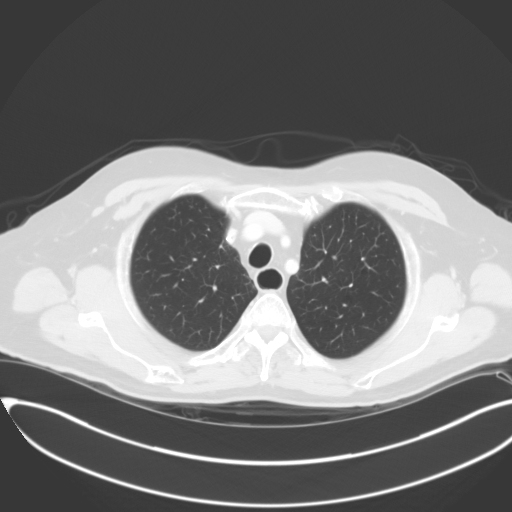

[Series 5: coronals · coronal · 0.82mm/px · 3 of 137 slices shown]
[im 28/137  lung]
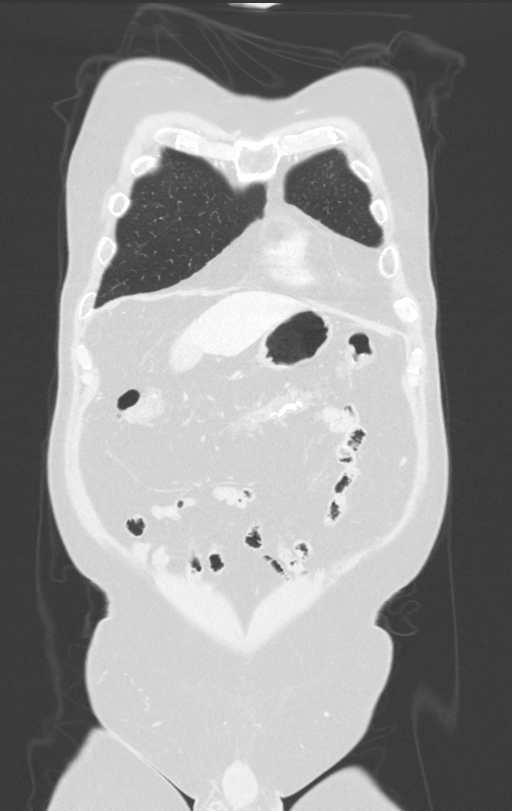
[im 55/137  lung]
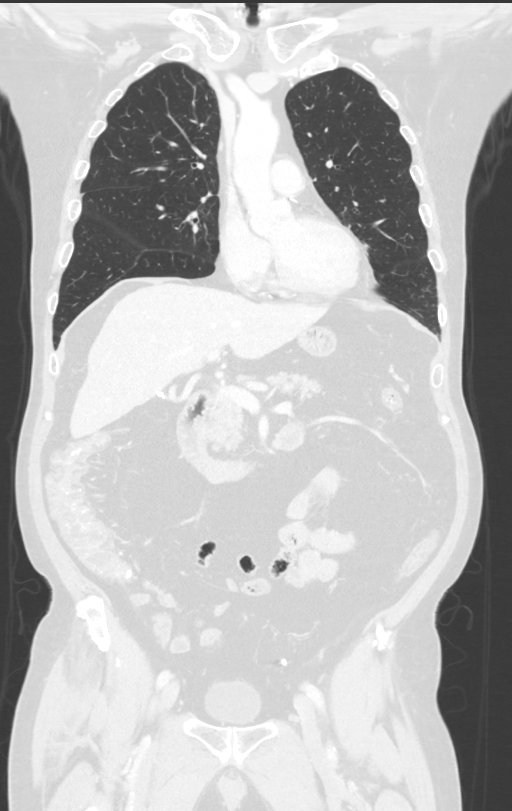
[im 82/137  lung]
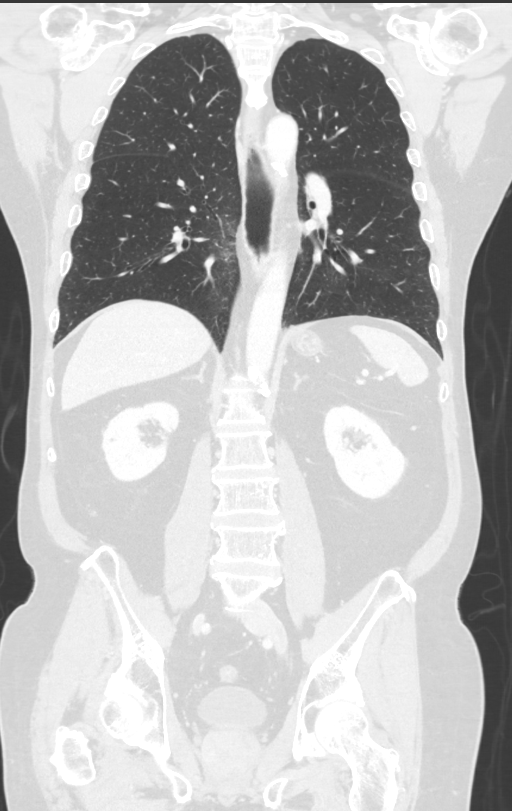

[12 of 36 positions shown; findings below may reference images not displayed]

FINDINGS: CT CHEST FINDINGS

Cardiovascular: Normal heart size. Coronary arterial vascular
calcifications. Thoracic aortic vascular calcifications. Right upper
extremity PICC line tip terminates in the superior vena cava.

Mediastinum/Nodes: No enlarged axillary, mediastinal or hilar
lymphadenopathy. There is wall thickening and narrowing of the
distal esophagus (image 50; series 2).

Lungs/Pleura: There is a 7 mm subpleural nodule in the lingula
(image 96; series 4). Calcified granuloma within the medial right
upper lobe (image 63; series 4). Dependent atelectasis/ scarring
within the lower lobes bilaterally. No pleural effusion or
pneumothorax.

Musculoskeletal: Thoracic spine degenerative changes. No aggressive
or acute appearing osseous lesions.

CT ABDOMEN PELVIS FINDINGS

Hepatobiliary: The liver is normal in size and contour.
Subcentimeter too small to characterize low-attenuation lesion left
hepatic lobe (image 50 ; series 2). Fatty deposition adjacent to the
falciform ligament. Status post cholecystectomy. No intrahepatic or
extrahepatic biliary ductal dilatation.

Pancreas: Mildly atrophic.

Spleen: Calcified granulomas in the spleen.

Adrenals/Urinary Tract: The adrenal glands are normal. Kidneys
enhance symmetrically with contrast. No hydronephrosis. Urinary
bladder is unremarkable.

Stomach/Bowel: Colon is decompressed, limiting evaluation.
Suggestion of mild colonic wall thickening. Normal morphology of the
stomach. There is a 1.3 cm soft tissue nodule adjacent to the GE
junction (image 53; series 2).

Vascular/Lymphatic: Normal caliber abdominal aorta. Peripheral
calcified atherosclerotic plaque. No retroperitoneal
lymphadenopathy.

Reproductive: Prostate unremarkable.

Other: Bilateral fat containing inguinal hernias.

Musculoskeletal: Lumbar spine degenerative changes. No aggressive or
acute appearing osseous lesions.
IMPRESSION: There is a 1.3 cm soft tissue nodule adjacent to the GE junction.
This is favored to represent an enlarged lymph node, potentially
reactive or metastatic in etiology.

The colon is decompressed however there is suggestion of wall
thickening, raising the possibility of colitis.

There is a 7 mm nodule within the left upper lobe. Non-contrast
chest CT at 6-12 months is recommended. If the nodule is stable at
time of repeat CT, then future CT at 18-24 months (from today's
scan) is considered optional for low-risk patients, but is
recommended for high-risk patients. This recommendation follows the
consensus statement: Guidelines for Management of Incidental
Pulmonary Nodules Detected on CT Images: From the [HOSPITAL]

Narrowing and wall thickening of the distal esophagus, compatible
with known distal esophageal stricture.

## 2016-12-11 MED ORDER — TRACE MINERALS CR-CU-MN-SE-ZN 10-1000-500-60 MCG/ML IV SOLN
INTRAVENOUS | Status: DC
  Administered 2016-12-11: 19:00:00 via INTRAVENOUS
  Filled 2016-12-11: qty 2000

## 2016-12-11 MED ORDER — CLINIMIX E/DEXTROSE (5/20) 5 % IV SOLN
INTRAVENOUS | Status: DC
  Filled 2016-12-11: qty 2000

## 2016-12-11 MED ORDER — IOPAMIDOL (ISOVUE-300) INJECTION 61%
100.0000 mL | Freq: Once | INTRAVENOUS | Status: AC | PRN
  Administered 2016-12-11: 18:00:00 100 mL via INTRAVENOUS

## 2016-12-11 MED ORDER — PANTOPRAZOLE SODIUM 40 MG IV SOLR
40.0000 mg | Freq: Two times a day (BID) | INTRAVENOUS | Status: DC
  Administered 2016-12-11 – 2016-12-12 (×2): 40 mg via INTRAVENOUS
  Filled 2016-12-11 (×2): qty 40

## 2016-12-11 MED ORDER — PANTOPRAZOLE SODIUM 40 MG PO PACK: 40.0000 mg | PACK | Freq: Two times a day (BID) | ORAL | Status: DC

## 2016-12-11 MED ORDER — FAT EMULSION 20 % IV EMUL
250.0000 mL | INTRAVENOUS | Status: AC
  Administered 2016-12-11: 250 mL via INTRAVENOUS
  Filled 2016-12-11: qty 250

## 2016-12-11 NOTE — Progress Notes (Signed)
PHARMACY - ADULT TOTAL PARENTERAL NUTRITION CONSULT NOTE   Pharmacy Consult for TPN Indication: esophageal stenosis  Patient Measurements: Height: 5\' 9"  (175.3 cm) Weight: 181 lb 4.8 oz (82.2 kg) IBW/kg (Calculated) : 70.7 TPN AdjBW (KG): 81.6 Body mass index is 26.77 kg/m.   Assessment: 76 yo male admitted with h/o dysphagia for months and severe malnutrition. EGD showing possible malignant stricture. Patient is at high refeeding risk    GI: LBM: 9/30 Endo: 123-132 Insulin requirements in the past 24 hours: 0 units Lytes:Potassium: 3.8   Phos: 4.5   Mg: 2.1  Renal:CrCl: 70.53mL/min  Best Practices:PICC placement 9/27 TPN Access:9/27 TPN start date:9/30  Nutritional Goals (per RD recommendation on 12/07/2016): 2235ml volume KCal: 2233kcal/day Protein: 100g/day   Current Nutrition: Clinimix 5/20 at goal rate of 40mL/hr.   Plan:  Continue Clinimix 5/20 with electrolytes at 29ml/hr x 24hrs Continue 20% Lipids @20ml /hr x 12 hrs Continue MVI SSI insulin and CBGs ordered Monitor TPN labs albumin, CBC, BMP, magnesium, phos, LFTs, serum triglycerides  F/U daily   Ebone Alcivar 12/11/2016,9:00 AM

## 2016-12-11 NOTE — Progress Notes (Signed)
Nutrition Follow-up  DOCUMENTATION CODES:   Severe malnutrition in context of acute illness/injury  INTERVENTION:  Continue Clinimix E 5/20 at 83 ml/hr over 24 hrs + 20% ILE at 20 ml/hr over 12 hours. Provides 2233 kcal, 99.6 grams of protein, 1992 ml fluid daily.  Provided Care Management with current TPN order, which meets estimated calorie/protein needs and is being tolerated well by patient.  Once diet able to be advanced following dilation in a few weeks, TPN can be tapered down and then discontinued if patient is tolerating diet well. Recommend tapering to 1/2 rate for 1-2 hours prior to discontinuing. Feel free to call with any nutrition-related questions. Office number for clinical dietitians is [phone].  NUTRITION DIAGNOSIS:   Malnutrition related to acute illness, other (see comment) (esophageal dysphagia ) as evidenced by energy intake < or equal to 50% for > or equal to 5 days, severe depletion of muscle mass, per patient/family report.  Ongoing.  GOAL:   Patient will meet greater than or equal to 90% of their needs  Met with TPN.  MONITOR:   Labs, Weight trends, I & O's, Other (Comment) (TPN)  REASON FOR ASSESSMENT:   Consult New TPN/TNA  ASSESSMENT:   76 y/o male with h/o dysphagia for several months s/p EGD found to have esophageal stricture, biopsies pending  Met with patient at bedside. He is frustrated with his length of stay. Reports he is tolerating TPN well with no complaints. Has had some nausea, but was improved with nausea medication.  Access: PICC double lumen placed 12/07/2016; placement verified by IV team  TPN: patient tolerating goal TPN rate of Clinimx E 5/20 at 83 ml/hr + 20% ILE at 20 ml/hr over 12 hrs  Medications reviewed and include: Novolog 0-9 units Q4hrs (none received past 24 hrs), pantoprazole.  Labs reviewed: CBG 118-135 past 24 hrs, BUN 28. Potassium, Phosphorus, Magnesium WNL.  I/O: unmeasured urine output (3 occurrences  past 24 hrs) and bowel movements  Weight trend: 82.2 kg on 10/1 - fairly weight stable; +0.6 kg from admission weight  Discussed with RN.  Also discussed nutrition plan of care with MD. Biopsy results came back and no malignancy identified. Per GI, patient will need to be on TPN for 2 weeks before esophagus can be dilated (will also need PPI in liquid form). Plan is for patient to discharge home on TPN. Discussed with Care Management.  Diet Order:  Diet NPO time specified .TPN (CLINIMIX-E) Adult .TPN (CLINIMIX-E) Adult  Skin:  Reviewed, no issues  Last BM:  12/10/2016 - stool chart not documented  Height:   Ht Readings from Last 1 Encounters:  12/06/16 '5\' 9"'  (1.753 m)    Weight:   Wt Readings from Last 1 Encounters:  12/11/16 181 lb 2 oz (82.2 kg)    Ideal Body Weight:  72.7 kg  BMI:  Body mass index is 26.75 kg/m.  Estimated Nutritional Needs:   Kcal:  1900-2200kcal/day   Protein:  90-106g/day   Fluid:  >2L/day   EDUCATION NEEDS:   Education needs addressed  Willey Blade, MS, RD, LDN Pager: [phone] After Hours Pager: [phone]

## 2016-12-11 NOTE — Progress Notes (Signed)
   Jonathon Bellows MD, MRCP(U.K) [address]  [place]  [place], [place] [address]  Main: [phone]    Casey Acevedo is being followed for dysphagia  Subjective: No complaints    Objective: Vital signs in last 24 hours: Vitals:   12/11/16 0437 12/11/16 0500 12/11/16 0959 12/11/16 1244  BP: 125/68  136/75 128/67  Pulse: 69  75 78  Resp: 20     Temp: 98.3 F (36.8 C)  98.2 F (36.8 C) 98.4 F (36.9 C)  TempSrc: Oral  Oral Oral  SpO2: 98%  99% 97%  Weight:  181 lb 4.8 oz (82.2 kg) 181 lb 2 oz (82.2 kg)   Height:       Weight change: 2 lb 4.8 oz (1.043 kg)  Intake/Output Summary (Last 24 hours) at 12/11/16 1440 Last data filed at 12/11/16 0300  Gross per 24 hour  Intake           1071.2 ml  Output                1 ml  Net           1070.2 ml     Exam: Heart:: Regular rate and rhythm, S1S2 present or without murmur or extra heart sounds Lungs: normal, clear to auscultation and clear to auscultation and percussion Abdomen: soft, nontender, normal bowel sounds   Lab Results: @LABTEST2 @ Micro Results: No results found for this or any previous visit (from the past 240 hour(s)). Studies/Results: No results found. Medications: I have reviewed the patient's current medications. Scheduled Meds: . enoxaparin (LOVENOX) injection  40 mg Subcutaneous Q24H  . insulin aspart  0-9 Units Subcutaneous Q4H  .  morphine injection  1 mg Intravenous Once  . pantoprazole sodium  40 mg Oral BID  . sodium chloride flush  10-40 mL Intracatheter Q12H   Continuous Infusions: . Marland KitchenTPN (CLINIMIX-E) Adult 83 mL/hr at 12/10/16 1636   PRN Meds:.acetaminophen **OR** acetaminophen, ketorolac, ondansetron **OR** ondansetron (ZOFRAN) IV, sodium chloride flush   Assessment: Active Problems:   Dysphagia   Protein-calorie malnutrition, severe   Esophageal stricture   Casey Acevedo 76 y.o. has been admitted with dysphagia. EGD showed a very tight 2 cm long stricture 2 cm above the GE  junction. Biopsies show no malignancy and show ulceration.  He is on TPN  Plan: 1. Explained although the biopsies show no malignancy it could be a false negative , more would need to be taken after dilation of the stricture . At this point dilation is more risky due to ulceration, suggest d/c home on TPN and 2 weeks of PPI. I will plan for a dilation in 2 weeks time and at the same time take more biopsies.   2. I also suggest Ct chest abdomen in the meanwhile to rule out any extra esophageal lymph nodes or masses which would suggest a more malignant appearing picture as our biopsies show none.   3. Diarrhea check for C diff  Discussed with Dr Vianne Bulls she will arrange for CT scan of the abdomen , IV PPI at home.    LOS: 4 days   Jonathon Bellows 12/11/2016, 2:40 PM

## 2016-12-11 NOTE — Care Management (Signed)
Possible discharge to home tomorrow per Dr. Vianne Bulls. Will need TPN and Protonix in the home. Already has PICC line in place. Shelbie Ammons RN MSN CCM Care management [phone]

## 2016-12-11 NOTE — Care Management Important Message (Signed)
Important Message  Patient Details  Name: ODILON CASS MRN: [id number] Date of Birth: 1941-02-01   Medicare Important Message Given:  Yes    Shelbie Ammons, RN 12/11/2016, 8:18 AM

## 2016-12-11 NOTE — Progress Notes (Signed)
[place] at [facility]                                                                                                                                                                                  Patient Demographics   Casey Acevedo, is a 76 y.o. male, DOB - 02-08-1941, UMP:[id number]  Admit date - 12/06/2016   Admitting Physician Gladstone Lighter, MD  Outpatient Primary MD for the patient is Idelle Crouch, MD   LOS - 4  Subjective:  denies any complaints. On TPN. And tolerating.  Review of Systems:   CONSTITUTIONAL: No documented fever. No fatigue, weakness. No weight gain, no weight loss.  EYES: No blurry or double vision.  ENT: No tinnitus. No postnasal drip. No redness of the oropharynx.  RESPIRATORY: No cough, no wheeze, no hemoptysis. No dyspnea.  CARDIOVASCULAR: No chest pain. No orthopnea. No palpitations. No syncope.  GASTROINTESTINAL: No nausea, no vomiting or diarrhea. No abdominal pain. No melena or hematochezia.  GENITOURINARY: No dysuria or hematuria.  ENDOCRINE: No polyuria or nocturia. No heat or cold intolerance.  HEMATOLOGY: No anemia. No bruising. No bleeding.  INTEGUMENTARY: No rashes. No lesions.  MUSCULOSKELETAL: No arthritis. No swelling. No gout.  NEUROLOGIC: No numbness, tingling, or ataxia. No seizure-type activity.  PSYCHIATRIC: No anxiety. No insomnia. No ADD.    Vitals:   Vitals:   12/11/16 0437 12/11/16 0500 12/11/16 0959 12/11/16 1244  BP: 125/68  136/75 128/67  Pulse: 69  75 78  Resp: 20     Temp: 98.3 F (36.8 C)  98.2 F (36.8 C) 98.4 F (36.9 C)  TempSrc: Oral  Oral Oral  SpO2: 98%  99% 97%  Weight:  82.2 kg (181 lb 4.8 oz) 82.2 kg (181 lb 2 oz)   Height:        Wt Readings from Last 3 Encounters:  12/11/16 82.2 kg (181 lb 2 oz)  08/08/16 83.6 kg (184 lb 4.8 oz)     Intake/Output Summary (Last 24 hours) at 12/11/16 1414 Last data filed at 12/11/16 0300  Gross per 24 hour  Intake            1071.2 ml  Output                1 ml  Net           1070.2 ml    Physical Exam:   GENERAL: Pleasant-appearing in no apparent distress.  HEAD, EYES, EARS, NOSE AND THROAT: Atraumatic, normocephalic. Extraocular muscles are intact. Pupils equal and reactive to light. Sclerae anicteric. No conjunctival injection. No oro-pharyngeal erythema.  NECK: Supple. There is no jugular venous distention. No  bruits, no lymphadenopathy, no thyromegaly.  HEART: Regular rate and rhythm,. No murmurs, no rubs, no clicks.  LUNGS: Clear to auscultation bilaterally. No rales or rhonchi. No wheezes.  ABDOMEN: Soft, flat, nontender, nondistended. Has good bowel sounds. No hepatosplenomegaly appreciated.  EXTREMITIES: No evidence of any cyanosis, clubbing, or peripheral edema.  +2 pedal and radial pulses bilaterally.  NEUROLOGIC: The patient is alert, awake, and oriented x3 with no focal motor or sensory deficits appreciated bilaterally.  SKIN: Moist and warm with no rashes appreciated.  Psych: Not anxious, depressed LN: No inguinal LN enlargement    Antibiotics   Anti-infectives    None      Medications   Scheduled Meds: . enoxaparin (LOVENOX) injection  40 mg Subcutaneous Q24H  . insulin aspart  0-9 Units Subcutaneous Q4H  .  morphine injection  1 mg Intravenous Once  . pantoprazole (PROTONIX) IV  40 mg Intravenous Q12H  . sodium chloride flush  10-40 mL Intracatheter Q12H   Continuous Infusions: . Marland KitchenTPN (CLINIMIX-E) Adult 83 mL/hr at 12/10/16 1636   PRN Meds:.acetaminophen **OR** acetaminophen, ketorolac, ondansetron **OR** ondansetron (ZOFRAN) IV, sodium chloride flush   Data Review:   Micro Results No results found for this or any previous visit (from the past 240 hour(s)).  Radiology Reports Dg Esophagus  Result Date: 12/06/2016 CLINICAL DATA:  Difficulty swallowing, constant vomiting EXAM: ESOPHOGRAM/BARIUM SWALLOW TECHNIQUE: Single contrast examination was performed using  thick  barium. FLUOROSCOPY TIME:  Fluoroscopy Time:  0.5 minute Radiation Exposure Index (if provided by the fluoroscopic device): 5.7 mGy Number of Acquired Spot Images: 0 COMPARISON:  None. FINDINGS: There was normal pharyngeal anatomy and motility. Patient is extremely nauseous. Mild esophageal dilatation with a high-grade stricture of the distal esophagus with irregular margins. IMPRESSION: 1. 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 Recommend upper endoscopy. Electronically Signed   By: Kathreen Devoid   On: 12/06/2016 11:53     CBC  Recent Labs Lab 12/06/16 1722 12/07/16 0521 12/08/16 0543 12/11/16 0402  WBC 6.5 5.5 5.2 7.1  HGB 15.4 14.7 13.9 14.6  HCT 45.3 43.1 40.6 42.4  PLT 237 204 196 175  MCV 88.7 88.3 89.4 88.8  MCH 30.2 30.1 30.6 30.6  MCHC 34.1 34.0 34.2 34.5  RDW 13.6 13.9 13.9 13.7  LYMPHSABS 2.1  --   --  1.7  MONOABS 0.5  --   --  0.8  EOSABS 0.1  --   --  0.3  BASOSABS 0.1  --   --  0.1    Chemistries   Recent Labs Lab 12/06/16 1722 12/07/16 0521 12/08/16 0543 12/09/16 0546 12/10/16 0519 12/11/16 0402  NA 138 141 139 140 141 140  K 3.9 3.9 3.9 3.4* 3.8 3.8  CL 103 109 112* 111 112* 111  CO2 23 22 24 23 23 24   GLUCOSE 78 62* 137* 138* 130* 132*  BUN 14 15 18 18  22* 28*  CREATININE 1.11 1.23 1.12 0.97 0.84 0.91  CALCIUM 9.3 8.7* 8.4* 8.4* 8.5* 8.4*  MG  --   --  1.9 1.7 1.9 2.1  AST 15  --   --   --   --  12*  ALT 15*  --   --   --   --  14*  ALKPHOS 63  --   --   --   --  52  BILITOT 1.0  --   --   --   --  0.5   ------------------------------------------------------------------------------------------------------------------ estimated creatinine clearance is 70.1 mL/min (by C-G formula based on SCr of 0.91 mg/dL). ------------------------------------------------------------------------------------------------------------------ No results  for input(s): HGBA1C in the last 72 hours. ------------------------------------------------------------------------------------------------------------------  Recent Labs  12/09/16 0546 12/11/16 0402  TRIG 186* 92   ------------------------------------------------------------------------------------------------------------------ No results for input(s): TSH, T4TOTAL, T3FREE, THYROIDAB in the last 72 hours.  Invalid input(s): FREET3 ------------------------------------------------------------------------------------------------------------------ No results for input(s): VITAMINB12, FOLATE, FERRITIN, TIBC, IRON, RETICCTPCT in the last 72 hours.  Coagulation profile  Recent Labs Lab 12/06/16 1722  INR 1.03    No results for input(s): DDIMER in the last 72 hours.  Cardiac Enzymes No results for input(s): CKMB, TROPONINI, MYOGLOBIN in the last 168 hours.  Invalid input(s): CK ------------------------------------------------------------------------------------------------------------------ Invalid input(s): POCBNP    Assessment & Plan   Active Problems:  *Dysphagia : status post EGD, biopsy. Biopsy results showing marker site was noted to be a with underlying ulcer, malignancy is not identified. Discussed the results with gastroenterology Dr. Vicente Males, he suggested patient needs PPIs in liquid form ,TPN at least for 2 weeks before they  Can dilated esophagus. * hypoglycemia now resolved  *GERD continue PPI  * miscellaneous Lovenox for DVT prophylaxis      Code Status Orders        Start     Ordered   12/06/16 1924  Full code  Continuous     12/06/16 1923    Code Status History    Date Active Date Inactive Code Status Order ID Comments User Context   This patient has a current code status but no historical code status.           Consults  gi  DVT Prophylaxis  Lovenox   Lab Results  Component Value Date   PLT 175 12/11/2016     Time Spent in minutes   67min Greater than 50% of time spent in care coordination and counseling patient regarding the condition and plan of care.   Epifanio Lesches M.D on 12/11/2016 at 2:14 PM  Between 7am to 6pm - Pager - [phone]  After 6pm go to [email or web address] - password [email or web address] [facility]   Office  [phone]

## 2016-12-11 NOTE — Plan of Care (Signed)
Informed RN Estill Bamberg of pt agitation due to wait of results from his testing.  RN aware and is awaiting MD assessment this morning. Will continue to try and calm patient concerns.  Lenore Cordia RNC RCC/B Malva Cogan RCC

## 2016-12-12 LAB — COMPREHENSIVE METABOLIC PANEL
ALK PHOS: 53 U/L (ref 38–126)
ALT: 14 U/L — AB (ref 17–63)
AST: 11 U/L — AB (ref 15–41)
Albumin: 3.5 g/dL (ref 3.5–5.0)
Anion gap: 6 (ref 5–15)
BUN: 29 mg/dL — AB (ref 6–20)
CALCIUM: 8.7 mg/dL — AB (ref 8.9–10.3)
CHLORIDE: 108 mmol/L (ref 101–111)
CO2: 23 mmol/L (ref 22–32)
CREATININE: 0.97 mg/dL (ref 0.61–1.24)
Glucose, Bld: 138 mg/dL — ABNORMAL HIGH (ref 65–99)
Potassium: 3.7 mmol/L (ref 3.5–5.1)
Sodium: 137 mmol/L (ref 135–145)
Total Bilirubin: 0.6 mg/dL (ref 0.3–1.2)
Total Protein: 6.1 g/dL — ABNORMAL LOW (ref 6.5–8.1)

## 2016-12-12 LAB — GLUCOSE, CAPILLARY
Glucose-Capillary: 135 mg/dL — ABNORMAL HIGH (ref 65–99)
Glucose-Capillary: 127 mg/dL — ABNORMAL HIGH (ref 65–99)

## 2016-12-12 LAB — PHOSPHORUS: PHOSPHORUS: 4.6 mg/dL (ref 2.5–4.6)

## 2016-12-12 MED ORDER — PANTOPRAZOLE SODIUM 40 MG IV SOLR: 40.0000 mg | Freq: Two times a day (BID) | INTRAVENOUS | 0 refills | Status: DC

## 2016-12-12 NOTE — Progress Notes (Signed)
PHARMACY - ADULT TOTAL PARENTERAL NUTRITION CONSULT NOTE   Pharmacy Consult for TPN Indication: esophageal stenosis  Patient Measurements: Height: 5\' 9"  (175.3 cm) Weight: 181 lb 3 oz (82.2 kg) IBW/kg (Calculated) : 70.7 TPN AdjBW (KG): 81.6 Body mass index is 26.76 kg/m.   Assessment: 76 yo male admitted with h/o dysphagia for months and severe malnutrition. EGD showing possible malignant stricture. Patient is at high refeeding risk    GI: LBM: 9/30 Endo: 123-132 Insulin requirements in the past 24 hours: 0 units Lytes:Potassium: 3.8   Phos: 4.5   Mg: 2.1  Renal:CrCl: 70.14mL/min  Best Practices:PICC placement 9/27 TPN Access:9/27 TPN start date:9/30  Nutritional Goals (per RD recommendation on 12/07/2016): 2212ml volume KCal: 2233kcal/day Protein: 100g/day   Current Nutrition: Clinimix 5/20 at goal rate of 44mL/hr.   Plan:  Continue Clinimix 5/20 with electrolytes at 37ml/hr x 24hrs Continue 20% Lipids @20ml /hr x 12 hrs Continue MVI SSI insulin and CBGs ordered Monitor TPN labs albumin, CBC, BMP, magnesium, phos, LFTs, serum triglycerides  F/U daily   Shanon Becvar 12/12/2016,7:39 AM

## 2016-12-12 NOTE — Plan of Care (Signed)
Pt d/ced home.  Going home with PICC line to receive TPN  For 2 weeks.  Will also have protonix via IV.  Will be NPO.  After 2 wk period, they will try esophageal dilation; it's too risky now b/c of ulceration.  Dr. Vicente Males also recommends f/u CT of abdomen to r/o any extra esophageal masses or lymph nodes. Home Health will help with TPN administration.  Patient refused lovenox and insulin injections.  Reviewed f/u appts and did teaching regarding PICC line cleaning and administration.  Pt was transported home by wife.  Wife doesn't seem able to help with PICC line.

## 2016-12-12 NOTE — Discharge Summary (Signed)
Casey Acevedo, is a 76 y.o. male  DOB [birth year]  MRN [id number].  Admission date:  12/06/2016  Admitting Physician  Gladstone Lighter, MD  Discharge Date:  12/12/2016   Primary MD  Idelle Crouch, MD  Recommendations for primary care physician for things to follow:   Follow-up with PCP in one week Follow up with Dr. Jonathon Bellows in 2 weeks.   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 Dysphagia   . GERD (gastroesophageal reflux disease)     Past Surgical History:  Procedure Laterality Date  . CHOLECYSTECTOMY    . ESOPHAGOGASTRODUODENOSCOPY (EGD) WITH PROPOFOL N/A 12/07/2016   Procedure: ESOPHAGOGASTRODUODENOSCOPY (EGD) WITH PROPOFOL;  Surgeon: Jonathon Bellows, MD;  Location: [facility] ENDOSCOPY;  Service: Gastroenterology;  Laterality: N/A;  . EYE SURGERY         History of present illness and  Hospital Course:     Kindly see H&P for history of present illness and admission details, please review complete Labs, Consult reports and Test reports for all details in brief  HPI  from the history and physical done on the day of admission 76 year old male patient admitted for difficulty swallowing for a couple of months. Patient initially had trouble swallowing solids then he changes the diet to soft diet but gradually got worse and he could not even swallow liquids. Admitted for dysphagia.   Hospital Course  #1 dysphagia; patient admitted to medical service, had EGD done by gastroenterology, patient had EGD done on September 27 which showed esophagela stricture. Patient received TPN, IV Protonix. Patient pathology from EGD  and biopsy showed markers cytological atypia with background ulcers, invasive cancer is not identified. Gastroenterology Dr. Santiago Glad on the discussed with patient and decided that patient needs upper endoscopy in 2 weeks. Patient will be discharged home with TPN, IV Protonix 40 mg twice a day.  #2 severe malnutrition in the contest of acute illness. Continue TPN at 83 ML per hour for about 24 hours. Orders are written instructions. Discharge home with home health registered nurse to to teach him about TPN, PPI. #3 diarrhea: C. difficile ruled out, C. difficile toxin is positive but PCR is negative. 4 CT chest, abdomen, pelvis done. Chest showed left upper lobe nodule repeat CAT scan in 6-12 months. Discussed this with patient.     Discharge Condition: stable   Follow UP  Follow-up Information    Jonathon Bellows, MD Follow up in 1 week(s).   Specialty:  Surgery Contact information: [address] [place] [id number] [phone]             Discharge Instructions  and  Discharge Medications     Discharge Instructions    Face-to-face encounter (required for Medicare/Medicaid patients)    Complete by:  As directed    I [facility] certify that this patient is under my care and that I, or a nurse practitioner or physician's assistant working with me, had a face-to-face encounter that meets the physician face-to-face encounter requirements with this patient on 12/11/2016. The encounter with the patient was in whole, or in part for the following medical condition(s) which is the primary reason for home health care  Esophageal stricture   The encounter with the patient was in whole, or in part, for the  following medical condition, which is the primary reason for home health care:  whole   I certify that, based on my findings, the following services are medically necessary home health services:  Nursing   Reason for Medically Necessary Home Health Services:  Skilled  Nursing- Lab Monitoring Requiring Frequent Medication Adjustment   My clinical findings support the need for the above services:  Unable to leave home safely without assistance and/or assistive device   Further, I certify that my clinical findings support that this patient is homebound due to:  Unable to leave home safely without assistance   Home Health    Complete by:  As directed    To provide the following care/treatments:  RN     Allergies as of 12/12/2016   No Known Allergies     Medication List    STOP taking these medications   ranitidine 150 MG tablet Commonly known as:  ZANTAC     TAKE these medications   aspirin EC 81 MG tablet Take by mouth.   MULTI-VITAMINS Tabs Take by mouth.   pantoprazole 40 MG injection Commonly known as:  PROTONIX Inject 40 mg into the vein every 12 (twelve) hours.         Diet and Activity recommendation: See Discharge Instructions above   Consults obtained - Gastroenterology   Major procedures and Radiology Reports - PLEASE review detailed and final reports for all details, in brief -      Ct Chest W Contrast  Result Date: 12/11/2016 CLINICAL DATA:  Patient with progressive difficulty swallowing. EXAM: CT CHEST, ABDOMEN, AND PELVIS WITH CONTRAST TECHNIQUE: Multidetector CT imaging of the chest, abdomen and pelvis was performed following the standard protocol during bolus administration of intravenous contrast. CONTRAST:  15mL ISOVUE-300 IOPAMIDOL (ISOVUE-300) INJECTION 61% COMPARISON:  CT abdomen pelvis 05/11/2006 FINDINGS: CT CHEST FINDINGS Cardiovascular: Normal heart size. Coronary arterial vascular calcifications. Thoracic aortic vascular calcifications. Right upper extremity PICC line tip terminates in the superior vena cava. Mediastinum/Nodes: No enlarged axillary, mediastinal or hilar lymphadenopathy. There is wall thickening and narrowing of the distal esophagus (image 50; series 2). Lungs/Pleura: There is a 7 mm subpleural  nodule in the lingula (image 96; series 4). Calcified granuloma within the medial right upper lobe (image 63; series 4). Dependent atelectasis/ scarring within the lower lobes bilaterally. No pleural effusion or pneumothorax. Musculoskeletal: Thoracic spine degenerative changes. No aggressive or acute appearing osseous lesions. CT ABDOMEN PELVIS FINDINGS Hepatobiliary: The liver is normal in size and contour. Subcentimeter too small to characterize low-attenuation lesion left hepatic lobe (image 50 ; series 2). Fatty deposition adjacent to the falciform ligament. Status post cholecystectomy. No intrahepatic or extrahepatic biliary ductal dilatation. Pancreas: Mildly atrophic. Spleen: Calcified granulomas in the spleen. Adrenals/Urinary Tract: The adrenal glands are normal. Kidneys enhance symmetrically with contrast. No hydronephrosis. Urinary bladder is unremarkable. Stomach/Bowel: Colon is decompressed, limiting evaluation. Suggestion of mild colonic wall thickening. Normal morphology of the stomach. There is a 1.3 cm soft tissue nodule adjacent to the GE junction (image 53; series 2). Vascular/Lymphatic: Normal caliber abdominal aorta. Peripheral calcified atherosclerotic plaque. No retroperitoneal lymphadenopathy. Reproductive: Prostate unremarkable. Other: Bilateral fat containing inguinal hernias. Musculoskeletal: Lumbar spine degenerative changes. No aggressive or acute appearing osseous lesions. IMPRESSION: There is a 1.3 cm soft tissue nodule adjacent to the GE junction. This is favored to represent an enlarged lymph node, potentially reactive or metastatic in etiology. The colon is decompressed however there is suggestion of wall thickening, raising the possibility of colitis. There  is a 7 mm nodule within the left upper lobe. Non-contrast chest CT at 6-12 months is recommended. If the nodule is stable at time of repeat CT, then future CT at 18-24 months (from today's scan) is considered optional for  low-risk patients, but is recommended for high-risk patients. This recommendation follows the consensus statement: Guidelines for Management of Incidental Pulmonary Nodules Detected on CT Images: From the Fleischner Society 2017; Radiology 2017; 284:228-243. Narrowing and wall thickening of the distal esophagus, compatible with known distal esophageal stricture. Electronically Signed   By: Lovey Newcomer M.D.   On: 12/11/2016 20:02   Ct Abdomen Pelvis W Contrast  Result Date: 12/11/2016 CLINICAL DATA:  Patient with progressive difficulty swallowing. EXAM: CT CHEST, ABDOMEN, AND PELVIS WITH CONTRAST TECHNIQUE: Multidetector CT imaging of the chest, abdomen and pelvis was performed following the standard protocol during bolus administration of intravenous contrast. CONTRAST:  129mL ISOVUE-300 IOPAMIDOL (ISOVUE-300) INJECTION 61% COMPARISON:  CT abdomen pelvis 05/11/2006 FINDINGS: CT CHEST FINDINGS Cardiovascular: Normal heart size. Coronary arterial vascular calcifications. Thoracic aortic vascular calcifications. Right upper extremity PICC line tip terminates in the superior vena cava. Mediastinum/Nodes: No enlarged axillary, mediastinal or hilar lymphadenopathy. There is wall thickening and narrowing of the distal esophagus (image 50; series 2). Lungs/Pleura: There is a 7 mm subpleural nodule in the lingula (image 96; series 4). Calcified granuloma within the medial right upper lobe (image 63; series 4). Dependent atelectasis/ scarring within the lower lobes bilaterally. No pleural effusion or pneumothorax. Musculoskeletal: Thoracic spine degenerative changes. No aggressive or acute appearing osseous lesions. CT ABDOMEN PELVIS FINDINGS Hepatobiliary: The liver is normal in size and contour. Subcentimeter too small to characterize low-attenuation lesion left hepatic lobe (image 50 ; series 2). Fatty deposition adjacent to the falciform ligament. Status post cholecystectomy. No intrahepatic or extrahepatic biliary  ductal dilatation. Pancreas: Mildly atrophic. Spleen: Calcified granulomas in the spleen. Adrenals/Urinary Tract: The adrenal glands are normal. Kidneys enhance symmetrically with contrast. No hydronephrosis. Urinary bladder is unremarkable. Stomach/Bowel: Colon is decompressed, limiting evaluation. Suggestion of mild colonic wall thickening. Normal morphology of the stomach. There is a 1.3 cm soft tissue nodule adjacent to the GE junction (image 53; series 2). Vascular/Lymphatic: Normal caliber abdominal aorta. Peripheral calcified atherosclerotic plaque. No retroperitoneal lymphadenopathy. Reproductive: Prostate unremarkable. Other: Bilateral fat containing inguinal hernias. Musculoskeletal: Lumbar spine degenerative changes. No aggressive or acute appearing osseous lesions. IMPRESSION: There is a 1.3 cm soft tissue nodule adjacent to the GE junction. This is favored to represent an enlarged lymph node, potentially reactive or metastatic in etiology. The colon is decompressed however there is suggestion of wall thickening, raising the possibility of colitis. There is a 7 mm nodule within the left upper lobe. Non-contrast chest CT at 6-12 months is recommended. If the nodule is stable at time of repeat CT, then future CT at 18-24 months (from today's scan) is considered optional for low-risk patients, but is recommended for high-risk patients. This recommendation follows the consensus statement: Guidelines for Management of Incidental Pulmonary Nodules Detected on CT Images: From the Fleischner Society 2017; Radiology 2017; 284:228-243. Narrowing and wall thickening of the distal esophagus, compatible with known distal esophageal stricture. Electronically Signed   By: Lovey Newcomer M.D.   On: 12/11/2016 20:02   Dg Esophagus  Result Date: 12/06/2016 CLINICAL DATA:  Difficulty swallowing, constant vomiting EXAM: ESOPHOGRAM/BARIUM SWALLOW TECHNIQUE: Single contrast examination was performed using  thick barium.  FLUOROSCOPY TIME:  Fluoroscopy Time:  0.5 minute Radiation Exposure Index (if provided by the  fluoroscopic device): 5.7 mGy Number of Acquired Spot Images: 0 COMPARISON:  None. FINDINGS: There was normal pharyngeal anatomy and motility. Patient is extremely nauseous. Mild esophageal dilatation with a high-grade stricture of the distal esophagus with irregular margins. IMPRESSION: 1. 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 Recommend upper endoscopy. Electronically Signed   By: Kathreen Devoid   On: 12/06/2016 11:53    Micro Results    Recent Results (from the past 240 hour(s))  C difficile quick scan w PCR reflex     Status: Abnormal   Collection Time: 12/11/16  7:26 PM  Result Value Ref Range Status   C Diff antigen POSITIVE (A) NEGATIVE Final   C Diff toxin NEGATIVE NEGATIVE Final   C Diff interpretation Results are indeterminate. See PCR results.  Final  Clostridium Difficile by PCR     Status: None   Collection Time: 12/11/16  7:26 PM  Result Value Ref Range Status   Toxigenic C Difficile by pcr NEGATIVE NEGATIVE Final    Comment: Patient is colonized with non toxigenic C. difficile. May not need treatment unless significant symptoms are present.       Today   Subjective:   Casey Acevedo today has no headache,no chest abdominal pain,no new weakness tingling or numbness, feels much better wants to go home today.   Objective:   Blood pressure 109/62, pulse 66, temperature 97.9 F (36.6 C), temperature source Oral, resp. rate 18, height 5\' 9"  (1.753 m), weight 82.2 kg (181 lb 3 oz), SpO2 98 %.   Intake/Output Summary (Last 24 hours) at 12/12/16 0914 Last data filed at 12/12/16 0608  Gross per 24 hour  Intake          1194.47 ml  Output                0 ml  Net          1194.47 ml    Exam Awake Alert, Oriented x 3, No new F.N deficits, Normal  affect [place].AT,PERRAL Supple Neck,No JVD, No cervical lymphadenopathy appriciated.  Symmetrical Chest wall movement, Good air movement bilaterally, CTAB RRR,No Gallops,Rubs or new Murmurs, No Parasternal Heave +ve B.Sounds, Abd Soft, Non tender, No organomegaly appriciated, No rebound -guarding or rigidity. No Cyanosis, Clubbing or edema, No new Rash or bruise  Data Review   CBC w Diff: Lab Results  Component Value Date   WBC 7.1 12/11/2016   HGB 14.6 12/11/2016   HCT 42.4 12/11/2016   PLT 175 12/11/2016   LYMPHOPCT 24 12/11/2016   MONOPCT 12 12/11/2016   EOSPCT 5 12/11/2016   BASOPCT 1 12/11/2016    CMP: Lab Results  Component Value Date   NA 137 12/12/2016   K 3.7 12/12/2016   CL 108 12/12/2016   CO2 23 12/12/2016   BUN 29 (H) 12/12/2016   CREATININE 0.97 12/12/2016   PROT 6.1 (L) 12/12/2016   ALBUMIN 3.5 12/12/2016   BILITOT 0.6 12/12/2016   ALKPHOS 53 12/12/2016   AST 11 (L) 12/12/2016   ALT 14 (L) 12/12/2016  .   Total Time in preparing paper work, data evaluation and todays exam - 74 minutes  Taniaya Rudder M.D on 12/12/2016 at 9:14 AM    Note: This dictation was prepared with Dragon dictation along with smaller phrase technology. Any transcriptional errors that result from this process are unintentional.

## 2016-12-12 NOTE — Care Management (Signed)
Discharge to home today per Dr. Vianne Bulls.  Will be followed by [facility] for home services. Will be receiving TPN and Protonix in the home. Family will transport Shelbie Ammons RN MSN CCM Care Management [phone]

## 2016-12-19 ENCOUNTER — Other Ambulatory Visit: Payer: Self-pay

## 2016-12-19 DIAGNOSIS — K222 Esophageal obstruction: Secondary | ICD-10-CM

## 2016-12-20 ENCOUNTER — Ambulatory Visit (INDEPENDENT_AMBULATORY_CARE_PROVIDER_SITE_OTHER): Payer: Medicare Other | Admitting: Gastroenterology

## 2016-12-20 ENCOUNTER — Encounter: Payer: Self-pay | Admitting: Gastroenterology

## 2016-12-20 VITALS — BP 121/76 | HR 90 | Temp 97.7°F | Ht 69.0 in | Wt 180.6 lb

## 2016-12-20 DIAGNOSIS — K222 Esophageal obstruction: Secondary | ICD-10-CM | POA: Diagnosis not present

## 2016-12-20 NOTE — Progress Notes (Signed)
Casey Bellows MD, MRCP(U.K) [address]  [place]  [place], [place] [address]  Main: [phone]  Fax: [phone]   Primary Care Physician: Idelle Crouch, MD  Primary Gastroenterologist:  Dr. Jonathon Acevedo   No chief complaint on file.   HPI: Casey Acevedo is a 76 y.o. male   He is here today for a hospital follow up .   Summary of history :  I was consulted to see him when he was admitted on 12/06/16 with dysphagia. 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 Biopsies show no malignancy and show ulceration.  He is on TPN.I had explained that although the biopsies showed no malignancy it could be a false negative , more would need to be taken after dilation of the stricture . At that point dilation was more risky due to ulceration, he was d/c home on TPN and 2 weeks of PPI with  plan for a dilation in 2 weeks time and at the same time take more biopsies. A Ct scan of the chest performed on 12/11/16 showed a 1.3 cm soft tissue nodule adjacent to the GE junction favoring an enlarged lymph node either reactive or metastatic. Possibility of colitis too was also noted. Nodule seen in the left upper lobe of the lung. Narrowing and wall thickening was seen a the distal esophagus .   Interval history   12/12/2016-  12/20/2016   Says his TPN line is leaking. Says his bandage has not been changed since 12/12/16. Didn't have any saline left to flush his tube. Subsequently he says he got some saline delivered. Having issues with sleep at night . Advised to discuss with Dr Doy Hutching if he could suggest any sublingual meds for help with sleep for a short period .     Current Outpatient Prescriptions  Medication Sig Dispense Refill  . aspirin EC 81 MG tablet Take by mouth.    . metoCLOPramide (REGLAN) 5 MG tablet TAKE 1 TABLET (5 MG TOTAL) BY MOUTH 3 (THREE) TIMES DAILY WITH MEALS FOR 10 DAYS.  2  . Multiple Vitamin (MULTI-VITAMINS) TABS Take by mouth.    .  pantoprazole (PROTONIX) 40 MG injection Inject 40 mg into the vein every 12 (twelve) hours. 30 each 0  . pantoprazole (PROTONIX) 40 MG tablet Take 40 mg by mouth 2 (two) times daily.  11   No current facility-administered medications for this visit.     Allergies as of 12/20/2016  . (No Known Allergies)    ROS:  General: Negative for anorexia, weight loss, fever, chills, fatigue, weakness. ENT: Negative for hoarseness, difficulty swallowing , nasal congestion. CV: Negative for chest pain, angina, palpitations, dyspnea on exertion, peripheral edema.  Respiratory: Negative for dyspnea at rest, dyspnea on exertion, cough, sputum, wheezing.  GI: See history of present illness. GU:  Negative for dysuria, hematuria, urinary incontinence, urinary frequency, nocturnal urination.  Endo: Negative for unusual weight change.    Physical Examination:   There were no vitals taken for this visit.  General: Well-nourished, well-developed in no acute distress.  Eyes: No icterus. Conjunctivae pink. Mouth: Oropharyngeal mucosa moist and pink , no lesions erythema or exudate. Lungs: Clear to auscultation bilaterally. Non-labored. Heart: Regular rate and rhythm, no murmurs rubs or gallops.  Abdomen: Bowel sounds are normal, nontender, nondistended, no hepatosplenomegaly or masses, no abdominal bruits or hernia , no rebound or guarding.   Extremities: No lower extremity edema. No clubbing or deformities.TPN  line Rt arm  Neuro: Alert and oriented x 3.  Grossly intact. Skin: Warm and dry, no jaundice.   Psych: Alert and cooperative, normal mood and affect.   Imaging Studies: Ct Chest W Contrast  Result Date: 12/11/2016 CLINICAL DATA:  Patient with progressive difficulty swallowing. EXAM: CT CHEST, ABDOMEN, AND PELVIS WITH CONTRAST TECHNIQUE: Multidetector CT imaging of the chest, abdomen and pelvis was performed following the standard protocol during bolus administration of intravenous contrast.  CONTRAST:  156mL ISOVUE-300 IOPAMIDOL (ISOVUE-300) INJECTION 61% COMPARISON:  CT abdomen pelvis 05/11/2006 FINDINGS: CT CHEST FINDINGS Cardiovascular: Normal heart size. Coronary arterial vascular calcifications. Thoracic aortic vascular calcifications. Right upper extremity PICC line tip terminates in the superior vena cava. Mediastinum/Nodes: No enlarged axillary, mediastinal or hilar lymphadenopathy. There is wall thickening and narrowing of the distal esophagus (image 50; series 2). Lungs/Pleura: There is a 7 mm subpleural nodule in the lingula (image 96; series 4). Calcified granuloma within the medial right upper lobe (image 63; series 4). Dependent atelectasis/ scarring within the lower lobes bilaterally. No pleural effusion or pneumothorax. Musculoskeletal: Thoracic spine degenerative changes. No aggressive or acute appearing osseous lesions. CT ABDOMEN PELVIS FINDINGS Hepatobiliary: The liver is normal in size and contour. Subcentimeter too small to characterize low-attenuation lesion left hepatic lobe (image 50 ; series 2). Fatty deposition adjacent to the falciform ligament. Status post cholecystectomy. No intrahepatic or extrahepatic biliary ductal dilatation. Pancreas: Mildly atrophic. Spleen: Calcified granulomas in the spleen. Adrenals/Urinary Tract: The adrenal glands are normal. Kidneys enhance symmetrically with contrast. No hydronephrosis. Urinary bladder is unremarkable. Stomach/Bowel: Colon is decompressed, limiting evaluation. Suggestion of mild colonic wall thickening. Normal morphology of the stomach. There is a 1.3 cm soft tissue nodule adjacent to the GE junction (image 53; series 2). Vascular/Lymphatic: Normal caliber abdominal aorta. Peripheral calcified atherosclerotic plaque. No retroperitoneal lymphadenopathy. Reproductive: Prostate unremarkable. Other: Bilateral fat containing inguinal hernias. Musculoskeletal: Lumbar spine degenerative changes. No aggressive or acute appearing  osseous lesions. IMPRESSION: There is a 1.3 cm soft tissue nodule adjacent to the GE junction. This is favored to represent an enlarged lymph node, potentially reactive or metastatic in etiology. The colon is decompressed however there is suggestion of wall thickening, raising the possibility of colitis. There is a 7 mm nodule within the left upper lobe. Non-contrast chest CT at 6-12 months is recommended. If the nodule is stable at time of repeat CT, then future CT at 18-24 months (from today's scan) is considered optional for low-risk patients, but is recommended for high-risk patients. This recommendation follows the consensus statement: Guidelines for Management of Incidental Pulmonary Nodules Detected on CT Images: From the Fleischner Society 2017; Radiology 2017; 284:228-243. Narrowing and wall thickening of the distal esophagus, compatible with known distal esophageal stricture. Electronically Signed   By: Lovey Newcomer M.D.   On: 12/11/2016 20:02   Ct Abdomen Pelvis W Contrast  Result Date: 12/11/2016 CLINICAL DATA:  Patient with progressive difficulty swallowing. EXAM: CT CHEST, ABDOMEN, AND PELVIS WITH CONTRAST TECHNIQUE: Multidetector CT imaging of the chest, abdomen and pelvis was performed following the standard protocol during bolus administration of intravenous contrast. CONTRAST:  136mL ISOVUE-300 IOPAMIDOL (ISOVUE-300) INJECTION 61% COMPARISON:  CT abdomen pelvis 05/11/2006 FINDINGS: CT CHEST FINDINGS Cardiovascular: Normal heart size. Coronary arterial vascular calcifications. Thoracic aortic vascular calcifications. Right upper extremity PICC line tip terminates in the superior vena cava. Mediastinum/Nodes: No enlarged axillary, mediastinal or hilar lymphadenopathy. There is wall thickening and narrowing of the distal esophagus (image 50; series 2). Lungs/Pleura: There is  a 7 mm subpleural nodule in the lingula (image 96; series 4). Calcified granuloma within the medial right upper lobe (image  63; series 4). Dependent atelectasis/ scarring within the lower lobes bilaterally. No pleural effusion or pneumothorax. Musculoskeletal: Thoracic spine degenerative changes. No aggressive or acute appearing osseous lesions. CT ABDOMEN PELVIS FINDINGS Hepatobiliary: The liver is normal in size and contour. Subcentimeter too small to characterize low-attenuation lesion left hepatic lobe (image 50 ; series 2). Fatty deposition adjacent to the falciform ligament. Status post cholecystectomy. No intrahepatic or extrahepatic biliary ductal dilatation. Pancreas: Mildly atrophic. Spleen: Calcified granulomas in the spleen. Adrenals/Urinary Tract: The adrenal glands are normal. Kidneys enhance symmetrically with contrast. No hydronephrosis. Urinary bladder is unremarkable. Stomach/Bowel: Colon is decompressed, limiting evaluation. Suggestion of mild colonic wall thickening. Normal morphology of the stomach. There is a 1.3 cm soft tissue nodule adjacent to the GE junction (image 53; series 2). Vascular/Lymphatic: Normal caliber abdominal aorta. Peripheral calcified atherosclerotic plaque. No retroperitoneal lymphadenopathy. Reproductive: Prostate unremarkable. Other: Bilateral fat containing inguinal hernias. Musculoskeletal: Lumbar spine degenerative changes. No aggressive or acute appearing osseous lesions. IMPRESSION: There is a 1.3 cm soft tissue nodule adjacent to the GE junction. This is favored to represent an enlarged lymph node, potentially reactive or metastatic in etiology. The colon is decompressed however there is suggestion of wall thickening, raising the possibility of colitis. There is a 7 mm nodule within the left upper lobe. Non-contrast chest CT at 6-12 months is recommended. If the nodule is stable at time of repeat CT, then future CT at 18-24 months (from today's scan) is considered optional for low-risk patients, but is recommended for high-risk patients. This recommendation follows the consensus  statement: Guidelines for Management of Incidental Pulmonary Nodules Detected on CT Images: From the Fleischner Society 2017; Radiology 2017; 284:228-243. Narrowing and wall thickening of the distal esophagus, compatible with known distal esophageal stricture. Electronically Signed   By: Lovey Newcomer M.D.   On: 12/11/2016 20:02   Dg Esophagus  Result Date: 12/06/2016 CLINICAL DATA:  Difficulty swallowing, constant vomiting EXAM: ESOPHOGRAM/BARIUM SWALLOW TECHNIQUE: Single contrast examination was performed using  thick barium. FLUOROSCOPY TIME:  Fluoroscopy Time:  0.5 minute Radiation Exposure Index (if provided by the fluoroscopic device): 5.7 mGy Number of Acquired Spot Images: 0 COMPARISON:  None. FINDINGS: There was normal pharyngeal anatomy and motility. Patient is extremely nauseous. Mild esophageal dilatation with a high-grade stricture of the distal esophagus with irregular margins. IMPRESSION: 1. 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 Recommend upper endoscopy. Electronically Signed   By: Kathreen Devoid   On: 12/06/2016 11:53    Assessment and Plan:   STELLAN VICK is a 76 y.o. y/o male here for a hospital follow up . Was admitted 3 weeks back for dysphagia , was unable to dilate a long distal tight esophageal stricture due to ulceration. He is on TPN and plan was to dilate him after 2 weeks of PPI therapy. Scheduled for next Tuesday   Plan  1. EGD+dilation under fluoroscopy on a Tuesday    I have discussed alternative options, risks & benefits,  which include, but are not limited to, bleeding, infection, perforation,respiratory complication & drug reaction.  The patient agrees with this plan & written consent will be obtained.    Dr Casey Bellows  MD,MRCP [facility]) Follow up in 8-12 weeks

## 2016-12-21 ENCOUNTER — Ambulatory Visit: Payer: Medicare Other | Admitting: Gastroenterology

## 2016-12-25 ENCOUNTER — Encounter: Payer: Self-pay | Admitting: *Deleted

## 2016-12-26 ENCOUNTER — Ambulatory Visit: Payer: Medicare Other | Admitting: Anesthesiology

## 2016-12-26 ENCOUNTER — Ambulatory Visit: Payer: Medicare Other

## 2016-12-26 ENCOUNTER — Encounter
Admission: RE | Disposition: A | Discharge status: Home / Self Care | Payer: Self-pay | Source: Ambulatory Visit | Attending: Gastroenterology

## 2016-12-26 ENCOUNTER — Encounter: Payer: Self-pay | Admitting: Anesthesiology

## 2016-12-26 ENCOUNTER — Ambulatory Visit
Admission: RE | Admit: 2016-12-26 | Discharge: 2016-12-26 | Disposition: A | Discharge status: Home / Self Care | Payer: Medicare Other | Source: Ambulatory Visit | Attending: Gastroenterology | Admitting: Gastroenterology

## 2016-12-26 ENCOUNTER — Other Ambulatory Visit: Payer: Self-pay | Admitting: Cardiothoracic Surgery

## 2016-12-26 ENCOUNTER — Encounter: Payer: Self-pay | Admitting: Cardiothoracic Surgery

## 2016-12-26 DIAGNOSIS — Z7982 Long term (current) use of aspirin: Secondary | ICD-10-CM | POA: Insufficient documentation

## 2016-12-26 DIAGNOSIS — Z79899 Other long term (current) drug therapy: Secondary | ICD-10-CM | POA: Diagnosis not present

## 2016-12-26 DIAGNOSIS — C159 Malignant neoplasm of esophagus, unspecified: Secondary | ICD-10-CM | POA: Diagnosis not present

## 2016-12-26 DIAGNOSIS — K222 Esophageal obstruction: Secondary | ICD-10-CM | POA: Insufficient documentation

## 2016-12-26 DIAGNOSIS — R1310 Dysphagia, unspecified: Secondary | ICD-10-CM | POA: Diagnosis present

## 2016-12-26 DIAGNOSIS — K219 Gastro-esophageal reflux disease without esophagitis: Secondary | ICD-10-CM | POA: Diagnosis not present

## 2016-12-26 DIAGNOSIS — C155 Malignant neoplasm of lower third of esophagus: Secondary | ICD-10-CM

## 2016-12-26 HISTORY — PX: ESOPHAGOGASTRODUODENOSCOPY (EGD) WITH PROPOFOL: SHX5813

## 2016-12-26 IMAGING — CT CT ABDOMEN W/O CM
2 of 4 series · 13 of 46 positions shown, 15 images · IV contrast (agent unspecified)
Comparison: Esophagram of earlier today.  Prior CTs of [DATE].

CLINICAL DATA: Esophageal cancer. Gastroesophageal reflux disease.
Dysphagia. Endoscopy from earlier today demonstrating esophageal
stenosis at approximately 40 cm from the incisors. Rule out
perforation of esophagus.

EXAM:
CT CHEST, ABDOMEN WITH CONTRAST
TECHNIQUE: Multidetector CT imaging of the chest, abdomen was performed
following the standard protocol during bolus administration of
intravenous contrast.
CONTRAST:  75 cc of [N0]

[Series 2: cap wo st · axial · 0.86mm/px · z∈[-500,-105]mm · 10 of 97 slices shown, 12 images]
[im 9/97  soft-tissue]
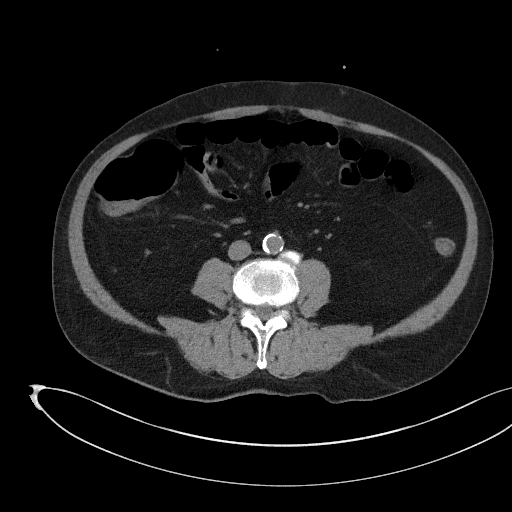
[im 9/97  bone]
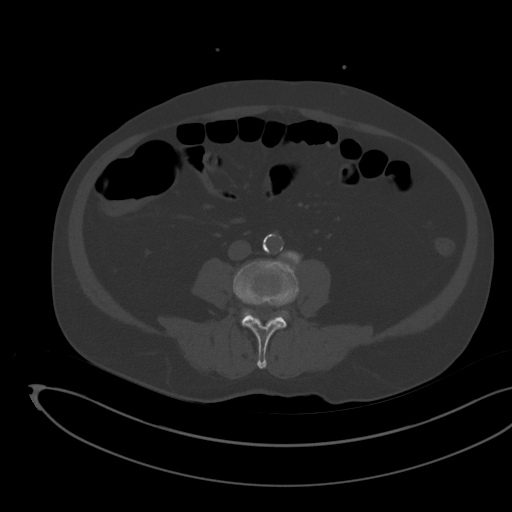
[im 18/97  soft-tissue]
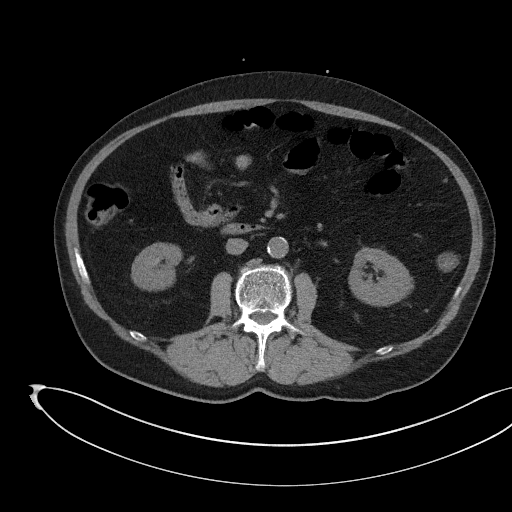
[im 27/97  soft-tissue]
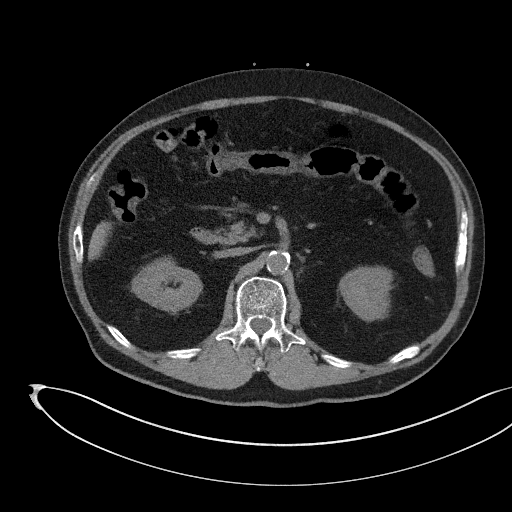
[im 35/97  soft-tissue]
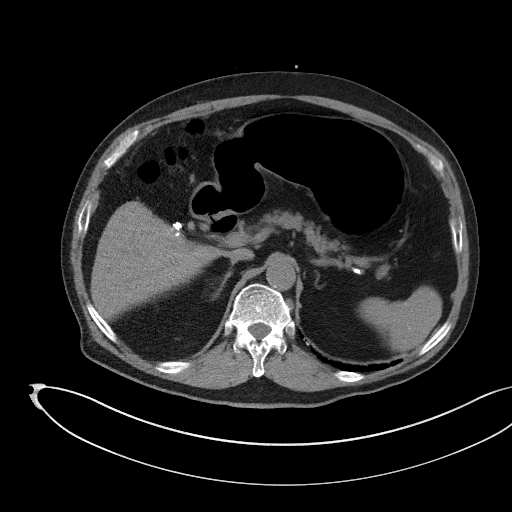
[im 44/97  soft-tissue]
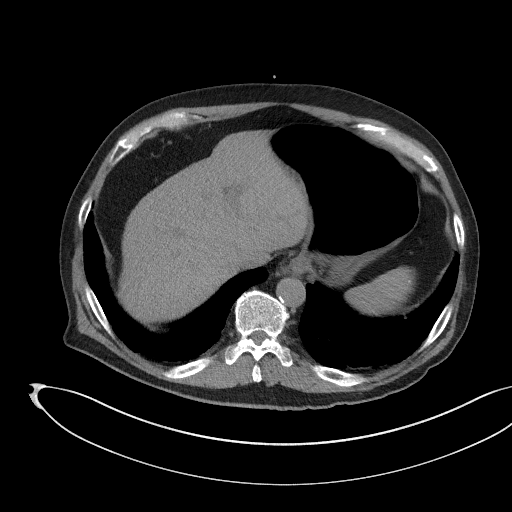
[im 53/97  soft-tissue]
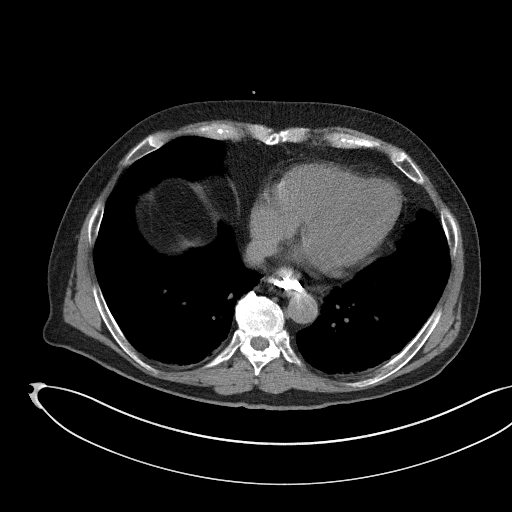
[im 62/97  soft-tissue]
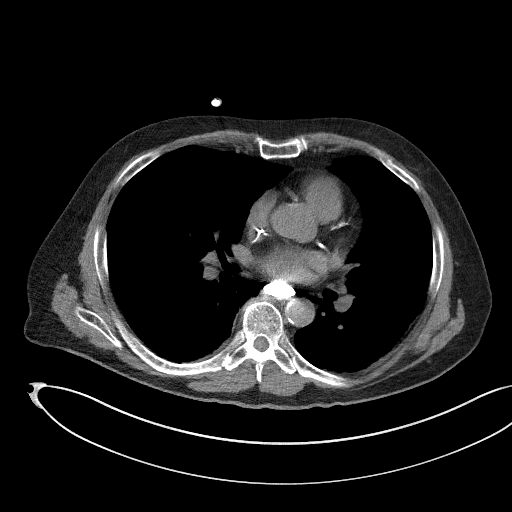
[im 70/97  soft-tissue]
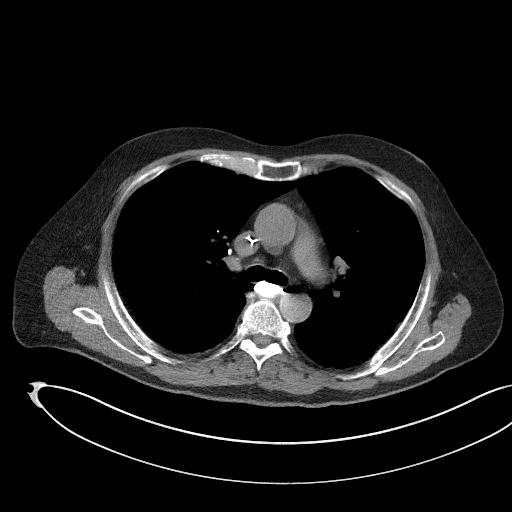
[im 79/97  soft-tissue]
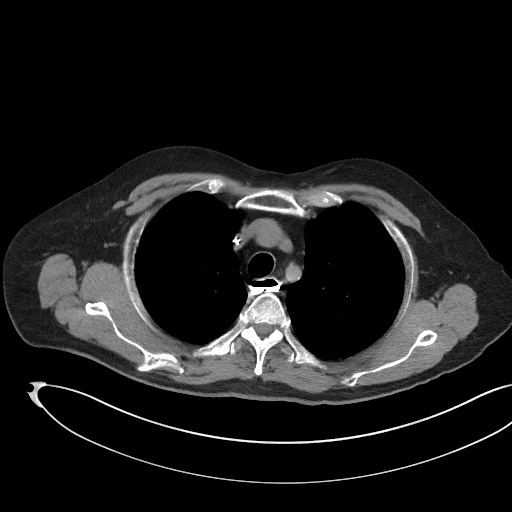
[im 79/97  bone]
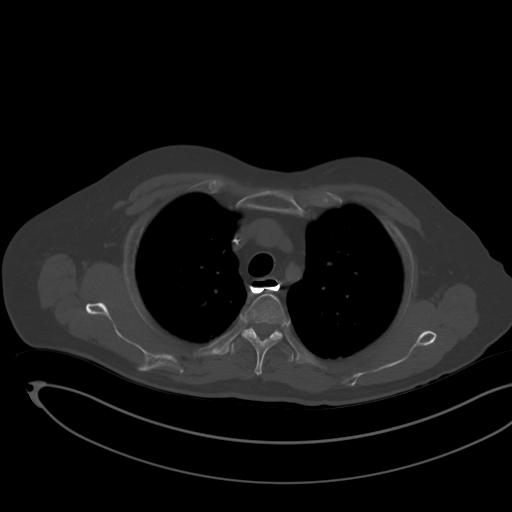
[im 88/97  soft-tissue]
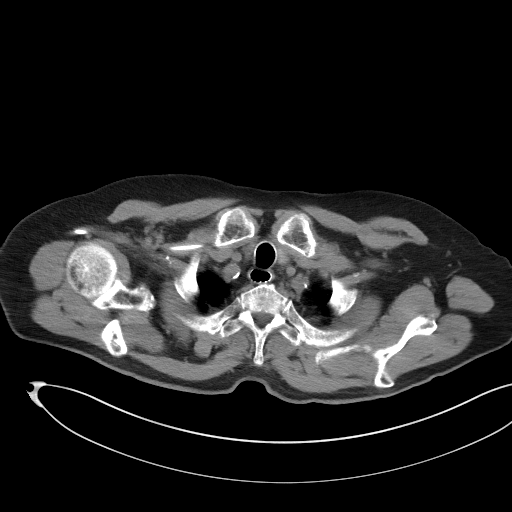

[Series 5: coronal · coronal · 0.76mm/px · 3 of 147 slices shown]
[im 49/147  soft-tissue]
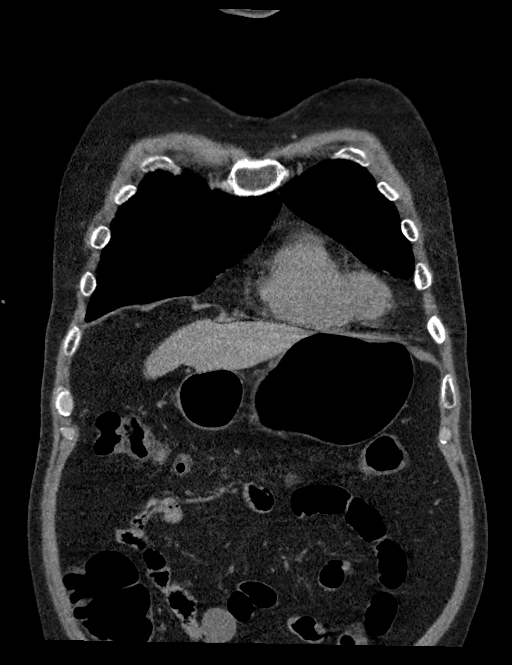
[im 65/147  soft-tissue]
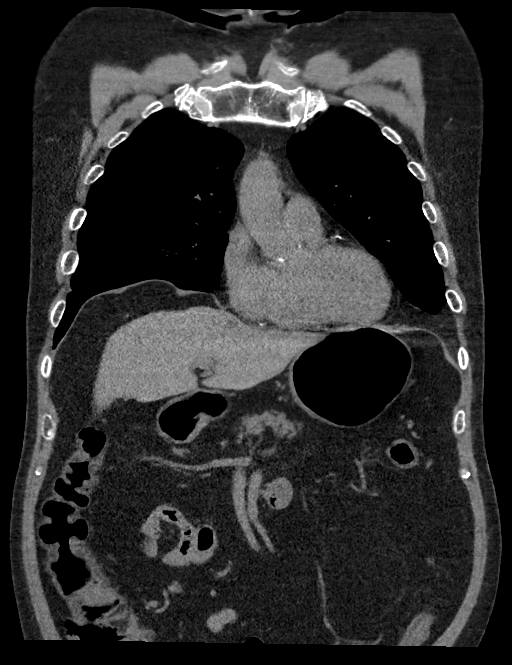
[im 82/147  soft-tissue]
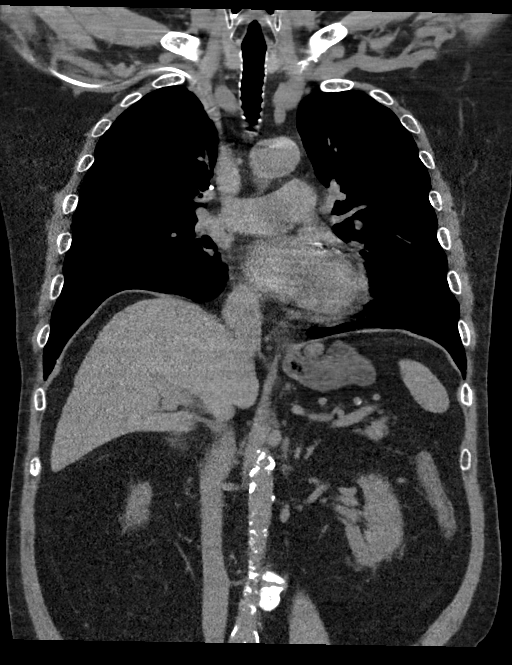

[13 of 46 positions shown; findings below may reference images not displayed]

FINDINGS: CT CHEST FINDINGS

Cardiovascular: Aortic and branch vessel atherosclerosis. Normal
heart size, without pericardial effusion. Multivessel coronary
artery atherosclerosis. A right-sided PICC line terminates at the
high right atrium.

Mediastinum/Nodes: No supraclavicular adenopathy. No mediastinal or
definite hilar adenopathy, given limitations of unenhanced CT.

Soft tissue fullness at the distal esophagus including on image
50/series 2. This is similar. The more proximal esophagus is dilated
and contrast filled from today's esophagram. No extraluminal
contrast identified. No pneumomediastinum.

Lungs/Pleura: No pleural fluid. Right upper lobe subpleural
calcified granuloma on image 65/series 4.

5 mm lingular nodule on image 97/series 4 is is similar to on the
prior.

Minimal subpleural left lower lobe nodularity at 3 mm on image
127/series 4. Not readily apparent on the prior, favored to
represent a subpleural lymph node.

Musculoskeletal: No acute osseous abnormality.

CT ABDOMEN  FINDINGS

Hepatobiliary: High left hepatic lobe subcentimeter low-density
lesion is likely a cyst. Cholecystectomy, without biliary ductal
dilatation.

Pancreas: Pancreatic atrophy, without duct dilatation or dominant
mass.

Spleen: Old granulomatous disease in the spleen.

Adrenals/Urinary Tract: Normal adrenal glands. No renal calculi or
hydronephrosis.

Stomach/Bowel: Normal appearance of the stomach. Normal abdominal
bowel loops.

Vascular/Lymphatic: Aortic and branch vessel atherosclerosis.
Gastrohepatic ligament adenopathy is again identified, including at
1.3 cm on image 53/series 2.

Other:  No ascites.  No abdominal peritoneal or omental metastasis.

Musculoskeletal: No acute osseous abnormality.
IMPRESSION: 1. Soft tissue fullness in the distal esophagus, likely representing
the primary site of malignancy. This is at least partially
obstructive, with contrast from today's esophagram identified within
a dilated more superior esophagus.
2. Isolated gastrohepatic ligament adenopathy, highly suspicious for
metastatic disease.
3. No evidence of esophageal perforation.
4. Nonspecific lingular nodule, similar.
5. Coronary artery atherosclerosis. Aortic Atherosclerosis
([N0]-[N0]).

## 2016-12-26 SURGERY — ESOPHAGOGASTRODUODENOSCOPY (EGD) WITH PROPOFOL
Anesthesia: General

## 2016-12-26 MED ORDER — LIDOCAINE HCL (CARDIAC) 20 MG/ML IV SOLN
INTRAVENOUS | Status: DC | PRN
  Administered 2016-12-26: 100 mg via INTRAVENOUS

## 2016-12-26 MED ORDER — PROPOFOL 10 MG/ML IV BOLUS
INTRAVENOUS | Status: DC | PRN
  Administered 2016-12-26: 80 mg via INTRAVENOUS
  Administered 2016-12-26: 30 mg via INTRAVENOUS
  Administered 2016-12-26: 20 mg via INTRAVENOUS

## 2016-12-26 MED ORDER — GLYCOPYRROLATE 0.2 MG/ML IJ SOLN
INTRAMUSCULAR | Status: AC
  Filled 2016-12-26: qty 1

## 2016-12-26 MED ORDER — PROPOFOL 500 MG/50ML IV EMUL
INTRAVENOUS | Status: DC | PRN
Start: 2016-12-26 — End: 2016-12-26
  Administered 2016-12-26: 12:00:00 via INTRAVENOUS
  Administered 2016-12-26: 150 ug/kg/min via INTRAVENOUS

## 2016-12-26 MED ORDER — SODIUM CHLORIDE 0.9 % IV SOLN
INTRAVENOUS | Status: DC
  Administered 2016-12-26: 10:00:00 via INTRAVENOUS

## 2016-12-26 MED ORDER — PROPOFOL 10 MG/ML IV BOLUS
INTRAVENOUS | Status: AC
  Filled 2016-12-26: qty 20

## 2016-12-26 MED ORDER — FENTANYL CITRATE (PF) 100 MCG/2ML IJ SOLN
25.0000 ug | Freq: Once | INTRAMUSCULAR | Status: AC
  Administered 2016-12-26: 25 ug via INTRAVENOUS

## 2016-12-26 MED ORDER — PROPOFOL 500 MG/50ML IV EMUL
INTRAVENOUS | Status: AC
  Filled 2016-12-26: qty 50

## 2016-12-26 MED ORDER — IOPAMIDOL (ISOVUE-300) INJECTION 61%
150.0000 mL | Freq: Once | INTRAVENOUS | Status: AC | PRN
  Administered 2016-12-26: 150 mL via ORAL

## 2016-12-26 MED ORDER — GLYCOPYRROLATE 0.2 MG/ML IJ SOLN
INTRAMUSCULAR | Status: DC | PRN
  Administered 2016-12-26: .2 mg via INTRAVENOUS

## 2016-12-26 MED ORDER — LIDOCAINE HCL (PF) 2 % IJ SOLN
INTRAMUSCULAR | Status: AC
  Filled 2016-12-26: qty 10

## 2016-12-26 NOTE — H&P (Signed)
  Jonathon Bellows MD [address], [place], [place] [address] Phone: [phone] Fax : [phone]  Primary Care Physician:  Idelle Crouch, MD Primary Gastroenterologist:  Dr. Jonathon Bellows   Pre-Procedure History & Physical: HPI:  Casey Acevedo is a 76 y.o. male is here for an endoscopy.   Past Medical History:  Diagnosis Date  . Dysphagia   . GERD (gastroesophageal reflux disease)     Past Surgical History:  Procedure Laterality Date  . CHOLECYSTECTOMY    . ESOPHAGOGASTRODUODENOSCOPY (EGD) WITH PROPOFOL N/A 12/07/2016   Procedure: ESOPHAGOGASTRODUODENOSCOPY (EGD) WITH PROPOFOL;  Surgeon: Jonathon Bellows, MD;  Location: [facility] ENDOSCOPY;  Service: Gastroenterology;  Laterality: N/A;  . EYE SURGERY      Prior to Admission medications   Medication Sig Start Date End Date Taking? Authorizing Provider  pantoprazole (PROTONIX) 40 MG injection Inject 40 mg into the vein every 12 (twelve) hours. 12/12/16  Yes Epifanio Lesches, MD  aspirin EC 81 MG tablet Take by mouth.    [provider]  metoCLOPramide (REGLAN) 5 MG tablet TAKE 1 TABLET (5 MG TOTAL) BY MOUTH 3 (THREE) TIMES DAILY WITH MEALS FOR 10 DAYS. 11/24/16   [provider]  Multiple Vitamin (MULTI-VITAMINS) TABS Take by mouth.    [provider]  pantoprazole (PROTONIX) 40 MG tablet Take 40 mg by mouth 2 (two) times daily. 11/24/16   [provider]    Allergies as of 12/19/2016  . (No Known Allergies)    Family History  Problem Relation Age of Onset  . Heart failure Father   . Prostate cancer Neg Hx   . Bladder Cancer Neg Hx   . Kidney cancer Neg Hx     Social History   Social History  . Marital status: Married    Spouse name: N/A  . Number of children: N/A  . Years of education: N/A   Occupational History  . Not on file.   Social History Main Topics  . Smoking status: Never Smoker  . Smokeless tobacco: Never Used  . Alcohol use No  . Drug use: No  . Sexual  activity: Not Currently   Other Topics Concern  . Not on file   Social History Narrative   Independent at baseline. Ambulatory    Review of Systems: See HPI, otherwise negative ROS  Physical Exam: BP 118/85   Pulse 99   Temp 97.6 F (36.4 C) (Tympanic)   Resp 18   Ht 5\' 9"  (1.753 m)   Wt 180 lb (81.6 kg)   SpO2 99%   BMI 26.58 kg/m  General:   Alert,  pleasant and cooperative in NAD Head:  Normocephalic and atraumatic. Neck:  Supple; no masses or thyromegaly. Lungs:  Clear throughout to auscultation.    Heart:  Regular rate and rhythm. Abdomen:  Soft, nontender and nondistended. Normal bowel sounds, without guarding, and without rebound.   Neurologic:  Alert and  oriented x4;  grossly normal neurologically.  Impression/Plan: Casey Acevedo is here for an endoscopy to be performed for dysphagia   Risks, benefits, limitations, and alternatives regarding  endoscopy and dilation  have been reviewed with the patient.  Questions have been answered.  All parties agreeable.   Jonathon Bellows, MD  12/26/2016, 9:50 AM

## 2016-12-26 NOTE — Op Note (Addendum)
[facility] Patient Name: Casey Acevedo Procedure Date: 12/26/2016 11:04 AM MRN: [id number] Account #: [id number] Date of Birth: 10/29/1940 Admit Type: Outpatient Age: 76 Room: [facility] ENDO ROOM 1 Gender: Male Note Status: Finalized Procedure:            Upper GI endoscopy Indications:          Dysphagia Providers:            Jonathon Bellows MD, MD Referring MD:         Leonie Douglas. Doy Hutching, MD (Referring MD) Medicines:            Monitored Anesthesia Care Complications:        No immediate complications. Procedure:            Pre-Anesthesia Assessment:                       - Prior to the procedure, a History and Physical was                        performed, and patient medications, allergies and                        sensitivities were reviewed. The patient's tolerance of                        previous anesthesia was reviewed.                       - The risks and benefits of the procedure and the                        sedation options and risks were discussed with the                        patient. All questions were answered and informed                        consent was obtained.                       - ASA Grade Assessment: III - A patient with severe                        systemic disease.                       After obtaining informed consent, the endoscope was                        passed under direct vision. Throughout the procedure,                        the patient's blood pressure, pulse, and oxygen                        saturations were monitored continuously. The                        Colonoscope was introduced through the mouth, and  advanced to the third part of duodenum. The upper GI                        endoscopy was extremely difficult due to narrowing. The                        patient tolerated the procedure well. Findings:      The examined duodenum was normal.      The stomach was normal.  One severe stenosis was found 38 to 40 cm from the incisors. This       measured 6 mm (inner diameter) x 2 cm (in length) and was traversed       after downsizing scope and dilating. A guide wire was placed, then the       scope was withdrawn. Using the wire as a guide, dilation with an 10-19-08       mm pyloric balloon dilator was performed under fluoroscopic guidance.       The dilation site was examined following endoscope reinsertion and       showed mild improvement in luminal narrowing. We were unable to pass the       adult scope , we subsequently further dilated the stricture with a CRE       dilator serially from 10-12 mm and subsequently the adult scope was able       to pass through. The stricture still appeared to be very tight and the       scope was not able to easily glide past. Biopsies were taken with a cold       forceps for histology. Impression:           - Normal examined duodenum.                       - Normal stomach.                       - Esophageal stenosis. Dilated. Biopsied. Recommendation:       - Discharge patient to home (with escort).                       - Clear liquid diet for 4 weeks.                       - Continue present medications.                       - Repeat upper endoscopy in 1 week for retreatment. Procedure Code(s):    --- Professional ---                       [phone], Esophagogastroduodenoscopy, flexible, transoral;                        with insertion of guide wire followed by passage of                        dilator(s) through esophagus over guide wire                       [id number], Esophagogastroduodenoscopy, flexible, transoral;  with biopsy, single or multiple                       [id number], Intraluminal dilation of strictures and/or                        obstructions (eg, esophagus), radiological supervision                        and interpretation Diagnosis Code(s):    --- Professional ---                        K22.2, Esophageal obstruction                       R13.10, Dysphagia, unspecified CPT copyright 2016 American Medical Association. All rights reserved. The codes documented in this report are preliminary and upon coder review may  be revised to meet current compliance requirements. Jonathon Bellows, MD Jonathon Bellows MD, MD 12/26/2016 12:02:59 PM This report has been signed electronically. Number of Addenda: 0 Note Initiated On: 12/26/2016 11:04 AM      [facility]

## 2016-12-26 NOTE — H&P (Signed)
Patient ID: Casey Acevedo, male   DOB: 04-28-1940, 76 y.o.   MRN: [id number]  No chief complaint on file.   Referred By Dr. Vicente Males  Reason for Referral Abdominal pain after esophageal dilatation  HPI Location, Quality, Duration, Severity, Timing, Context, Modifying Factors, Associated Signs and Symptoms.  Casey Acevedo is a 76 y.o. male.  Has had 3 month history of dysphagia and is status post EGD 2 weeks ago.  Biopsy of esophagus was negative for malignancy.  Treated with PICC and TPN.  Had another EGD and biopsy today with dilatation.  Complained of sever abdominal pain after procedure and had a Gastrograffin swallow and a CT of chest and abdomen.  These did not reveal any evidence of contrast extravasation.  His pain is better now.  No history of smoking, alcohol or Barrett's.  Endoscopy suggestive of malignancy however.   Past Medical History:  Diagnosis Date  . Dysphagia   . GERD (gastroesophageal reflux disease)     Past Surgical History:  Procedure Laterality Date  . CHOLECYSTECTOMY    . ESOPHAGOGASTRODUODENOSCOPY (EGD) WITH PROPOFOL N/A 12/07/2016   Procedure: ESOPHAGOGASTRODUODENOSCOPY (EGD) WITH PROPOFOL;  Surgeon: Jonathon Bellows, MD;  Location: [facility] ENDOSCOPY;  Service: Gastroenterology;  Laterality: N/A;  . EYE SURGERY      Family History  Problem Relation Age of Onset  . Heart failure Father   . Prostate cancer Neg Hx   . Bladder Cancer Neg Hx   . Kidney cancer Neg Hx     Social History Social History  Substance Use Topics  . Smoking status: Never Smoker  . Smokeless tobacco: Never Used  . Alcohol use No    No Known Allergies  No current facility-administered medications for this visit.    No current outpatient prescriptions on file.   Facility-Administered Medications Ordered in Other Visits  Medication Dose Route Frequency Provider Last Rate Last Dose  . 0.9 %  sodium chloride infusion   Intravenous Continuous Jonathon Bellows, MD 20 mL/hr at 12/26/16 0865         Review of Systems A complete review of systems was asked and was negative except for the following positive findings dysphagia, weight loss, abdominal pain.  There were no vitals taken for this visit.  Physical Exam CONSTITUTIONAL:  Pleasant, well-developed, well-nourished, and in no acute distress. EYES: Pupils equal and reactive to light, Sclera non-icteric.   EARS, NOSE, MOUTH AND THROAT:  The oropharynx was clear.  Dentition is good repair.  Oral mucosa pink and moist. There is no subcutaneous emphysema. LYMPH NODES:  Lymph nodes in the neck and axillae were normal RESPIRATORY:  Lungs were clear.  Normal respiratory effort without pathologic use of accessory muscles of respiration CARDIOVASCULAR: Heart was regular without murmurs.  There were no carotid bruits. GI: The abdomen was soft, nontender, and nondistended. There were no palpable masses. There was no hepatosplenomegaly. There were normal bowel sounds in all quadrants. GU:  Rectal deferred.   MUSCULOSKELETAL:  Normal muscle strength and tone.  No clubbing or cyanosis.   SKIN:  There were no pathologic skin lesions.  There were no nodules on palpation. NEUROLOGIC:  Sensation is normal.  Cranial nerves are grossly intact. PSYCH:  Oriented to person, place and time.  Mood and affect are normal.  Data Reviewed CT and Gastrograffin swallow  I have personally reviewed the patient's imaging, laboratory findings and medical records.    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  Plan    I have discussed his care with Drs. Karie Fetch and Timken.  I think that he may be a candidate for esophagectomy.  Would continue TPN and make referall to Dr. Barry Dienes at [facility] for consideration of esophagectomy.      Nestor Lewandowsky, MD 12/26/2016, 3:08 PM

## 2016-12-26 NOTE — Anesthesia Post-op Follow-up Note (Signed)
Anesthesia QCDR form completed.        

## 2016-12-26 NOTE — Anesthesia Preprocedure Evaluation (Signed)
Anesthesia Evaluation  Patient identified by MRN, date of birth, ID band Patient awake    Reviewed: Allergy & Precautions, H&P , NPO status , Patient's Chart, lab work & pertinent test results  History of Anesthesia Complications Negative for: history of anesthetic complications  Airway Mallampati: III  TM Distance: <3 FB Neck ROM: limited    Dental  (+) Poor Dentition, Chipped   Pulmonary neg pulmonary ROS, neg shortness of breath,           Cardiovascular Exercise Tolerance: Good (-) angina(-) Past MI and (-) DOE negative cardio ROS       Neuro/Psych negative neurological ROS  negative psych ROS   GI/Hepatic Neg liver ROS, GERD  Medicated and Controlled,  Endo/Other  negative endocrine ROS  Renal/GU negative Renal ROS  negative genitourinary   Musculoskeletal   Abdominal   Peds  Hematology negative hematology ROS (+)   Anesthesia Other Findings Past Medical History: No date: Dysphagia No date: GERD (gastroesophageal reflux disease)  Past Surgical History: No date: CHOLECYSTECTOMY 12/07/2016: ESOPHAGOGASTRODUODENOSCOPY (EGD) WITH PROPOFOL; N/A     Comment:  Procedure: ESOPHAGOGASTRODUODENOSCOPY (EGD) WITH               PROPOFOL;  Surgeon: Jonathon Bellows, MD;  Location: [facility]               ENDOSCOPY;  Service: Gastroenterology;  Laterality: N/A; No date: EYE SURGERY     Reproductive/Obstetrics negative OB ROS                             Anesthesia Physical Anesthesia Plan  ASA: III  Anesthesia Plan: General   Post-op Pain Management:    Induction: Intravenous  PONV Risk Score and Plan: Propofol infusion  Airway Management Planned: Natural Airway and Nasal Cannula  Additional Equipment:   Intra-op Plan:   Post-operative Plan:   Informed Consent: I have reviewed the patients History and Physical, chart, labs and discussed the procedure including the risks, benefits and  alternatives for the proposed anesthesia with the patient or authorized representative who has indicated his/her understanding and acceptance.   Dental Advisory Given  Plan Discussed with: Anesthesiologist, CRNA and Surgeon  Anesthesia Plan Comments: (Patient said that we could access his unused PICC line, plan to draw back before access in case it is heparin locked.  Patient consented for risks of anesthesia including but not limited to:  - adverse reactions to medications - risk of intubation if required - damage to teeth, lips or other oral mucosa - sore throat or hoarseness - Damage to heart, brain, lungs or loss of life  Patient voiced understanding.)        Anesthesia Quick Evaluation

## 2016-12-26 NOTE — OR Nursing (Signed)
Patient right upper arm Picc Line in tact, unused port was flushed with 10 ml NS followed by 5 ml hep lock flush after IV disconnected. Patient tolerated procedure well.

## 2016-12-26 NOTE — Progress Notes (Signed)
Performed an EGD with dilation for a very tight esophageal stricture earlier today. Poor response to therapy; Severe abdominal pain after dilation . Performed CT scan of the abdomen and gastrograffin swallow which shows no perforation but possibility of cancer of the esophagus which is what I suspect endoscopically.  Appears the dilation has not been succesful as per gastrografin swallow. He was discharged from endoscopy after imaging as he felt better.  Dr Jonathon Bellows MD,MRCP [facility]) Gastroenterology/Hepatology Pager: [phone]

## 2016-12-26 NOTE — Transfer of Care (Signed)
Immediate Anesthesia Transfer of Care Note  Patient: Casey Acevedo  Procedure(s) Performed: ESOPHAGOGASTRODUODENOSCOPY (EGD) WITH PROPOFOL WITH DILATION (N/A )  Patient Location: Endoscopy Unit  Anesthesia Type:General  Level of Consciousness: sedated  Airway & Oxygen Therapy: Patient Spontanous Breathing and Patient connected to nasal cannula oxygen  Post-op Assessment: Report given to RN and Post -op Vital signs reviewed and stable  Post vital signs: Reviewed and stable  Last Vitals:  Vitals:   12/26/16 0930  BP: 118/85  Pulse: 99  Resp: 18  Temp: 36.4 C  SpO2: 99%    Last Pain:  Vitals:   12/26/16 0930  TempSrc: Tympanic         Complications: No apparent anesthesia complications

## 2016-12-26 NOTE — Anesthesia Postprocedure Evaluation (Signed)
Anesthesia Post Note  Patient: RUBEL HECKARD  Procedure(s) Performed: ESOPHAGOGASTRODUODENOSCOPY (EGD) WITH PROPOFOL WITH DILATION (N/A )  Patient location during evaluation: Endoscopy Anesthesia Type: General Level of consciousness: awake and alert Pain management: pain level controlled Vital Signs Assessment: post-procedure vital signs reviewed and stable Respiratory status: spontaneous breathing, nonlabored ventilation, respiratory function stable and patient connected to nasal cannula oxygen Cardiovascular status: blood pressure returned to baseline and stable Postop Assessment: no apparent nausea or vomiting Anesthetic complications: no     Last Vitals:  Vitals:   12/26/16 1240 12/26/16 1250  BP: (!) 163/91 (!) 150/85  Pulse: 91 84  Resp: 15 (!) 2  Temp:    SpO2: 98% 99%    Last Pain:  Vitals:   12/26/16 1230  TempSrc:   PainSc: 8                  Joseph K Piscitello

## 2016-12-27 ENCOUNTER — Telehealth: Payer: Self-pay

## 2016-12-27 ENCOUNTER — Encounter: Payer: Self-pay | Admitting: Gastroenterology

## 2016-12-27 ENCOUNTER — Other Ambulatory Visit: Payer: Self-pay | Admitting: Pathology

## 2016-12-27 LAB — SURGICAL PATHOLOGY

## 2016-12-27 NOTE — Telephone Encounter (Signed)
-----   Message from Jonathon Bellows, MD sent at 12/27/2016  2:25 PM EDT ----- Karlin Heilman/Michelle or Panya ..... Not sure who is in this PM. This is the patient I did the EGD on yesterday with Dr Allen Norris . I need to explain results to the patient in person. Do not give results over the phone. I can see him tomorrow afternoon , after my endoscopy session.  FYI Dr Doy Hutching

## 2016-12-27 NOTE — Telephone Encounter (Signed)
Spoke with pt and scheduled a follow up appt to discuss EGD results with pt.

## 2016-12-27 NOTE — Progress Notes (Signed)
Jonathon Bellows MD, MRCP(U.K) [address]  [place]  [place], [place] [address]  Main: [phone]  Fax: [phone]   Primary Care Physician: Idelle Crouch, MD  Primary Gastroenterologist:  Dr. Jonathon Bellows   Chief Complaint  Patient presents with  . Results    HPI: Casey Acevedo is a 76 y.o. male   I have asked the patient to come in to discuss recent biopsy results of his esophagus.   Summary of history :  I was consulted to see him when he was admitted on 12/06/16 with dysphagia. 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 Biopsies showed no malignancy .Marland Kitchen He has been  on TPN.I had explained that although the biopsies showed no malignancy it could be a false negative , more would need to be taken after dilation of the stricture. At that point dilation was more risky due to ulceration, he was d/c home on TPN and 2 weeks of PPI with  plan for a dilation in 2 weeks time and at the same time take more biopsies. A Ct scan of the chest performed on 12/11/16 showed a 1.3 cm soft tissue nodule adjacent to the GE junction favoring an enlarged lymph node either reactive or metastatic.   Interval history   12/20/2016 -12/27/16   I repeated his EGD on 12/26/16 and noted the lower esophageal stricture which was almost reduced the lumen to less than 3-4 mm . The stricture was 2 cm long and the distal end was 2 cm above the GE junction. No evidence of Barrttes was seen.  We had to perform a very long and complex procedure and serially dilated the stricture to 12 mm. Despite dilation we did not obtain an adequate result, as soon as the dilation was complete the stricture closed up significantly. 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   Since dilation able to consume some popsicles. No abdominal pain.  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   Current Outpatient Prescriptions  Medication Sig Dispense Refill  . aspirin EC 81 MG tablet Take by mouth.    . metoCLOPramide (REGLAN) 5 MG tablet TAKE 1 TABLET (5 MG TOTAL) BY MOUTH 3 (THREE) TIMES DAILY WITH MEALS FOR 10 DAYS.  2  . Multiple Vitamin (MULTI-VITAMINS) TABS Take by mouth.    . pantoprazole (PROTONIX) 40 MG injection Inject 40 mg into the vein every 12 (twelve) hours. 30 each 0  . pantoprazole (PROTONIX) 40 MG tablet Take 40 mg by mouth 2 (two) times daily.  11   No current facility-administered medications for this visit.     Allergies as of 12/28/2016  . (No Known Allergies)    ROS:  General: Negative for anorexia, weight loss, fever, chills, fatigue, weakness. ENT: Negative for hoarseness, difficulty swallowing , nasal congestion. CV: Negative for chest pain, angina, palpitations, dyspnea on exertion, peripheral edema.  Respiratory: Negative for dyspnea at rest, dyspnea on exertion, cough, sputum, wheezing.  GI: See history of present illness. GU:  Negative for dysuria, hematuria, urinary incontinence, urinary frequency, nocturnal urination.  Endo: Negative for unusual weight change.    Physical Examination:   There were no vitals taken for this visit.  General: Well-nourished, well-developed in no acute distress.  Eyes: No icterus. Conjunctivae  pink. Mouth: Oropharyngeal mucosa moist and pink , no lesions erythema or exudate. Lungs: Clear to auscultation bilaterally. Non-labored. Heart: Regular rate and rhythm, no murmurs rubs or gallops.  Abdomen: Bowel sounds are normal, nontender, nondistended, no hepatosplenomegaly or masses, no abdominal bruits or hernia , no rebound or guarding.   Extremities: No lower extremity edema. No clubbing or deformities. Neuro: Alert and oriented x 3.  Grossly intact. Skin: Warm and dry, no jaundice.   Psych: Alert and cooperative,  normal mood and affect.   Imaging Studies: Ct Abdomen Wo Contrast  Result Date: 12/26/2016 CLINICAL DATA:  Esophageal cancer. Gastroesophageal reflux disease. Dysphagia. Endoscopy from earlier today demonstrating esophageal stenosis at approximately 40 cm from the incisors. Rule out perforation of esophagus. EXAM: CT CHEST, ABDOMEN WITH CONTRAST TECHNIQUE: Multidetector CT imaging of the chest, abdomen was performed following the standard protocol during bolus administration of intravenous contrast. CONTRAST:  75 cc of Isovue-300 COMPARISON:  Esophagram of earlier today.  Prior CTs of 12/11/2016. FINDINGS: CT CHEST FINDINGS Cardiovascular: Aortic and branch vessel atherosclerosis. Normal heart size, without pericardial effusion. Multivessel coronary artery atherosclerosis. A right-sided PICC line terminates at the high right atrium. Mediastinum/Nodes: No supraclavicular adenopathy. No mediastinal or definite hilar adenopathy, given limitations of unenhanced CT. Soft tissue fullness at the distal esophagus including on image 50/series 2. This is similar. The more proximal esophagus is dilated and contrast filled from today's esophagram. No extraluminal contrast identified. No pneumomediastinum. Lungs/Pleura: No pleural fluid. Right upper lobe subpleural calcified granuloma on image 65/series 4. 5 mm lingular nodule on image 97/series 4 is is similar to on the prior. Minimal subpleural left lower lobe nodularity at 3 mm on image 127/series 4. Not readily apparent on the prior, favored to represent a subpleural lymph node. Musculoskeletal: No acute osseous abnormality. CT ABDOMEN  FINDINGS Hepatobiliary: High left hepatic lobe subcentimeter low-density lesion is likely a cyst. Cholecystectomy, without biliary ductal dilatation. Pancreas: Pancreatic atrophy, without duct dilatation or dominant mass. Spleen: Old granulomatous disease in the spleen. Adrenals/Urinary Tract: Normal adrenal glands. No renal calculi or  hydronephrosis. Stomach/Bowel: Normal appearance of the stomach. Normal abdominal bowel loops. Vascular/Lymphatic: Aortic and branch vessel atherosclerosis. Gastrohepatic ligament adenopathy is again identified, including at 1.3 cm on image 53/series 2. Other:  No ascites.  No abdominal peritoneal or omental metastasis. Musculoskeletal: No acute osseous abnormality. IMPRESSION: 1. Soft tissue fullness in the distal esophagus, likely representing the primary site of malignancy. This is at least partially obstructive, with contrast from today's esophagram identified within a dilated more superior esophagus. 2. Isolated gastrohepatic ligament adenopathy, highly suspicious for metastatic disease. 3. No evidence of esophageal perforation. 4. Nonspecific lingular nodule, similar. 5. Coronary artery atherosclerosis. Aortic Atherosclerosis (ICD10-I70.0). Electronically Signed   By: Abigail Miyamoto M.D.   On: 12/26/2016 14:58   Ct Chest Wo Contrast  Result Date: 12/26/2016 CLINICAL DATA:  Esophageal cancer. Gastroesophageal reflux disease. Dysphagia. Endoscopy from earlier today demonstrating esophageal stenosis at approximately 40 cm from the incisors. Rule out perforation of esophagus. EXAM: CT CHEST, ABDOMEN WITH CONTRAST TECHNIQUE: Multidetector CT imaging of the chest, abdomen was performed following the standard protocol during bolus administration of intravenous contrast. CONTRAST:  75 cc of Isovue-300 COMPARISON:  Esophagram of earlier today.  Prior CTs of 12/11/2016. FINDINGS: CT CHEST FINDINGS Cardiovascular: Aortic and branch vessel atherosclerosis. Normal heart size, without pericardial effusion. Multivessel coronary artery atherosclerosis. A right-sided PICC line terminates at the high right atrium. Mediastinum/Nodes: No supraclavicular adenopathy. No mediastinal or definite hilar adenopathy,  given limitations of unenhanced CT. Soft tissue fullness at the distal esophagus including on image 50/series 2. This  is similar. The more proximal esophagus is dilated and contrast filled from today's esophagram. No extraluminal contrast identified. No pneumomediastinum. Lungs/Pleura: No pleural fluid. Right upper lobe subpleural calcified granuloma on image 65/series 4. 5 mm lingular nodule on image 97/series 4 is is similar to on the prior. Minimal subpleural left lower lobe nodularity at 3 mm on image 127/series 4. Not readily apparent on the prior, favored to represent a subpleural lymph node. Musculoskeletal: No acute osseous abnormality. CT ABDOMEN  FINDINGS Hepatobiliary: High left hepatic lobe subcentimeter low-density lesion is likely a cyst. Cholecystectomy, without biliary ductal dilatation. Pancreas: Pancreatic atrophy, without duct dilatation or dominant mass. Spleen: Old granulomatous disease in the spleen. Adrenals/Urinary Tract: Normal adrenal glands. No renal calculi or hydronephrosis. Stomach/Bowel: Normal appearance of the stomach. Normal abdominal bowel loops. Vascular/Lymphatic: Aortic and branch vessel atherosclerosis. Gastrohepatic ligament adenopathy is again identified, including at 1.3 cm on image 53/series 2. Other:  No ascites.  No abdominal peritoneal or omental metastasis. Musculoskeletal: No acute osseous abnormality. IMPRESSION: 1. Soft tissue fullness in the distal esophagus, likely representing the primary site of malignancy. This is at least partially obstructive, with contrast from today's esophagram identified within a dilated more superior esophagus. 2. Isolated gastrohepatic ligament adenopathy, highly suspicious for metastatic disease. 3. No evidence of esophageal perforation. 4. Nonspecific lingular nodule, similar. 5. Coronary artery atherosclerosis. Aortic Atherosclerosis (ICD10-I70.0). Electronically Signed   By: Abigail Miyamoto M.D.   On: 12/26/2016 14:58   Ct Chest W Contrast  Result Date: 12/11/2016 CLINICAL DATA:  Patient with progressive difficulty swallowing. EXAM: CT CHEST,  ABDOMEN, AND PELVIS WITH CONTRAST TECHNIQUE: Multidetector CT imaging of the chest, abdomen and pelvis was performed following the standard protocol during bolus administration of intravenous contrast. CONTRAST:  150mL ISOVUE-300 IOPAMIDOL (ISOVUE-300) INJECTION 61% COMPARISON:  CT abdomen pelvis 05/11/2006 FINDINGS: CT CHEST FINDINGS Cardiovascular: Normal heart size. Coronary arterial vascular calcifications. Thoracic aortic vascular calcifications. Right upper extremity PICC line tip terminates in the superior vena cava. Mediastinum/Nodes: No enlarged axillary, mediastinal or hilar lymphadenopathy. There is wall thickening and narrowing of the distal esophagus (image 50; series 2). Lungs/Pleura: There is a 7 mm subpleural nodule in the lingula (image 96; series 4). Calcified granuloma within the medial right upper lobe (image 63; series 4). Dependent atelectasis/ scarring within the lower lobes bilaterally. No pleural effusion or pneumothorax. Musculoskeletal: Thoracic spine degenerative changes. No aggressive or acute appearing osseous lesions. CT ABDOMEN PELVIS FINDINGS Hepatobiliary: The liver is normal in size and contour. Subcentimeter too small to characterize low-attenuation lesion left hepatic lobe (image 50 ; series 2). Fatty deposition adjacent to the falciform ligament. Status post cholecystectomy. No intrahepatic or extrahepatic biliary ductal dilatation. Pancreas: Mildly atrophic. Spleen: Calcified granulomas in the spleen. Adrenals/Urinary Tract: The adrenal glands are normal. Kidneys enhance symmetrically with contrast. No hydronephrosis. Urinary bladder is unremarkable. Stomach/Bowel: Colon is decompressed, limiting evaluation. Suggestion of mild colonic wall thickening. Normal morphology of the stomach. There is a 1.3 cm soft tissue nodule adjacent to the GE junction (image 53; series 2). Vascular/Lymphatic: Normal caliber abdominal aorta. Peripheral calcified atherosclerotic plaque. No  retroperitoneal lymphadenopathy. Reproductive: Prostate unremarkable. Other: Bilateral fat containing inguinal hernias. Musculoskeletal: Lumbar spine degenerative changes. No aggressive or acute appearing osseous lesions. IMPRESSION: There is a 1.3 cm soft tissue nodule adjacent to the GE junction. This is favored to represent an enlarged lymph node, potentially reactive or metastatic  in etiology. The colon is decompressed however there is suggestion of wall thickening, raising the possibility of colitis. There is a 7 mm nodule within the left upper lobe. Non-contrast chest CT at 6-12 months is recommended. If the nodule is stable at time of repeat CT, then future CT at 18-24 months (from today's scan) is considered optional for low-risk patients, but is recommended for high-risk patients. This recommendation follows the consensus statement: Guidelines for Management of Incidental Pulmonary Nodules Detected on CT Images: From the Fleischner Society 2017; Radiology 2017; 284:228-243. Narrowing and wall thickening of the distal esophagus, compatible with known distal esophageal stricture. Electronically Signed   By: Lovey Newcomer M.D.   On: 12/11/2016 20:02   Ct Abdomen Pelvis W Contrast  Result Date: 12/11/2016 CLINICAL DATA:  Patient with progressive difficulty swallowing. EXAM: CT CHEST, ABDOMEN, AND PELVIS WITH CONTRAST TECHNIQUE: Multidetector CT imaging of the chest, abdomen and pelvis was performed following the standard protocol during bolus administration of intravenous contrast. CONTRAST:  154mL ISOVUE-300 IOPAMIDOL (ISOVUE-300) INJECTION 61% COMPARISON:  CT abdomen pelvis 05/11/2006 FINDINGS: CT CHEST FINDINGS Cardiovascular: Normal heart size. Coronary arterial vascular calcifications. Thoracic aortic vascular calcifications. Right upper extremity PICC line tip terminates in the superior vena cava. Mediastinum/Nodes: No enlarged axillary, mediastinal or hilar lymphadenopathy. There is wall thickening and  narrowing of the distal esophagus (image 50; series 2). Lungs/Pleura: There is a 7 mm subpleural nodule in the lingula (image 96; series 4). Calcified granuloma within the medial right upper lobe (image 63; series 4). Dependent atelectasis/ scarring within the lower lobes bilaterally. No pleural effusion or pneumothorax. Musculoskeletal: Thoracic spine degenerative changes. No aggressive or acute appearing osseous lesions. CT ABDOMEN PELVIS FINDINGS Hepatobiliary: The liver is normal in size and contour. Subcentimeter too small to characterize low-attenuation lesion left hepatic lobe (image 50 ; series 2). Fatty deposition adjacent to the falciform ligament. Status post cholecystectomy. No intrahepatic or extrahepatic biliary ductal dilatation. Pancreas: Mildly atrophic. Spleen: Calcified granulomas in the spleen. Adrenals/Urinary Tract: The adrenal glands are normal. Kidneys enhance symmetrically with contrast. No hydronephrosis. Urinary bladder is unremarkable. Stomach/Bowel: Colon is decompressed, limiting evaluation. Suggestion of mild colonic wall thickening. Normal morphology of the stomach. There is a 1.3 cm soft tissue nodule adjacent to the GE junction (image 53; series 2). Vascular/Lymphatic: Normal caliber abdominal aorta. Peripheral calcified atherosclerotic plaque. No retroperitoneal lymphadenopathy. Reproductive: Prostate unremarkable. Other: Bilateral fat containing inguinal hernias. Musculoskeletal: Lumbar spine degenerative changes. No aggressive or acute appearing osseous lesions. IMPRESSION: There is a 1.3 cm soft tissue nodule adjacent to the GE junction. This is favored to represent an enlarged lymph node, potentially reactive or metastatic in etiology. The colon is decompressed however there is suggestion of wall thickening, raising the possibility of colitis. There is a 7 mm nodule within the left upper lobe. Non-contrast chest CT at 6-12 months is recommended. If the nodule is stable at time  of repeat CT, then future CT at 18-24 months (from today's scan) is considered optional for low-risk patients, but is recommended for high-risk patients. This recommendation follows the consensus statement: Guidelines for Management of Incidental Pulmonary Nodules Detected on CT Images: From the Fleischner Society 2017; Radiology 2017; 284:228-243. Narrowing and wall thickening of the distal esophagus, compatible with known distal esophageal stricture. Electronically Signed   By: Lovey Newcomer M.D.   On: 12/11/2016 20:02   Dg Esophagus  Result Date: 12/06/2016 CLINICAL DATA:  Difficulty swallowing, constant vomiting EXAM: ESOPHOGRAM/BARIUM SWALLOW TECHNIQUE: Single contrast examination was performed using  thick barium. FLUOROSCOPY TIME:  Fluoroscopy Time:  0.5 minute Radiation Exposure Index (if provided by the fluoroscopic device): 5.7 mGy Number of Acquired Spot Images: 0 COMPARISON:  None. FINDINGS: There was normal pharyngeal anatomy and motility. Patient is extremely nauseous. Mild esophageal dilatation with a high-grade stricture of the distal esophagus with irregular margins. IMPRESSION: 1. 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 Recommend upper endoscopy. Electronically Signed   By: Kathreen Devoid   On: 12/06/2016 11:53   Dg C-arm 1-60 Min-no Report  Result Date: 12/26/2016 Fluoroscopy was utilized by the requesting physician.  No radiographic interpretation.   Dg Esophagus W/water Sol Cm  Result Date: 12/26/2016 CLINICAL DATA:  Recent esophageal instrumentation. Pain. Evaluate for esophageal rupture. EXAM: ESOPHOGRAM/BARIUM SWALLOW TECHNIQUE: Single contrast examination was performed using water-soluble contrast. FLUOROSCOPY TIME:  1 minutes and 6 seconds COMPARISON:  None. FINDINGS: The patient ingested water-soluble contrast and imaging was obtained. There is an obstruction of  the distal esophagus, just above the gastroesophageal junction. A fluid level exists in the esophagus. Contrast never traversed beyond the distal esophagus. The patient vomited. There is no evidence of extravasation to suggest leak or rupture. IMPRESSION: No evidence of contrast extravasation to suggest esophageal injury or perforation There is abrupt obstruction of the distal esophagus just above the gastroesophageal junction. Electronically Signed   By: Marybelle Killings M.D.   On: 12/26/2016 14:46    Assessment and Plan:   Casey Acevedo is a 76 y.o. 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   Plan  1. Will refer to Oncology to discuss at the MDT meeting for further care 2. Will refer to radiation oncology for radiation due to significant dysphagia. After radiation may attempt possible stenting 3. He does have an isolated gastrohepatic ligament lymph node enlarged on CT scan done 12/26/16 which is suspicious for metastatic disease.  4. Continue on TPN for now as we have no other source of enteral feeding  5. Can stop PPI as the stricture is malignant and not peptic in origin   Dr Jonathon Bellows  MD,MRCP [facility]) Follow up PRN .

## 2016-12-28 ENCOUNTER — Ambulatory Visit (INDEPENDENT_AMBULATORY_CARE_PROVIDER_SITE_OTHER): Payer: Medicare Other | Admitting: Gastroenterology

## 2016-12-28 ENCOUNTER — Telehealth: Payer: Self-pay

## 2016-12-28 DIAGNOSIS — R1310 Dysphagia, unspecified: Secondary | ICD-10-CM

## 2016-12-28 DIAGNOSIS — R1319 Other dysphagia: Secondary | ICD-10-CM

## 2016-12-28 DIAGNOSIS — K222 Esophageal obstruction: Secondary | ICD-10-CM

## 2016-12-28 DIAGNOSIS — C159 Malignant neoplasm of esophagus, unspecified: Secondary | ICD-10-CM | POA: Diagnosis not present

## 2016-12-28 NOTE — Telephone Encounter (Signed)
  Oncology Nurse Navigator Documentation Called and spoke with Mr. Casey Acevedo on the phone. Notified of appointment with Dr. Janese Banks at 0945 in the am. A new patient packet will be at the front desk for him. Directions to the [place] given. Navigator Location: [facility] (12/28/16 1400)   )Navigator Encounter Type: Telephone (12/28/16 1400) Telephone: Lahoma Crocker Call;Appt Confirmation/Clarification (12/28/16 1400)   Confirmed Diagnosis Date: 12/27/16 (12/28/16 1400)                   Barriers/Navigation Needs: Coordination of Care (12/28/16 1400)   Interventions: Coordination of Care (12/28/16 1400)            Acuity: Level 2 (12/28/16 1400)   Acuity Level 2: Initial guidance, education and coordination as needed;Educational needs;Assistance expediting appointments;Ongoing guidance and education throughout treatment as needed (12/28/16 1400)     Time Spent with Patient: 15 (12/28/16 1400)

## 2016-12-29 ENCOUNTER — Encounter: Payer: Self-pay | Admitting: Oncology

## 2016-12-29 ENCOUNTER — Inpatient Hospital Stay: Payer: Medicare Other | Attending: Oncology | Admitting: Oncology

## 2016-12-29 VITALS — BP 112/79 | HR 99 | Temp 98.4°F | Resp 14 | Wt 183.4 lb

## 2016-12-29 DIAGNOSIS — E43 Unspecified severe protein-calorie malnutrition: Secondary | ICD-10-CM | POA: Insufficient documentation

## 2016-12-29 DIAGNOSIS — K222 Esophageal obstruction: Secondary | ICD-10-CM | POA: Insufficient documentation

## 2016-12-29 DIAGNOSIS — I700 Atherosclerosis of aorta: Secondary | ICD-10-CM | POA: Diagnosis not present

## 2016-12-29 DIAGNOSIS — C159 Malignant neoplasm of esophagus, unspecified: Secondary | ICD-10-CM | POA: Diagnosis not present

## 2016-12-29 DIAGNOSIS — K219 Gastro-esophageal reflux disease without esophagitis: Secondary | ICD-10-CM | POA: Insufficient documentation

## 2016-12-29 DIAGNOSIS — Z87891 Personal history of nicotine dependence: Secondary | ICD-10-CM | POA: Insufficient documentation

## 2016-12-29 DIAGNOSIS — C155 Malignant neoplasm of lower third of esophagus: Secondary | ICD-10-CM | POA: Diagnosis present

## 2016-12-29 DIAGNOSIS — Z79899 Other long term (current) drug therapy: Secondary | ICD-10-CM | POA: Diagnosis not present

## 2016-12-29 DIAGNOSIS — Z9049 Acquired absence of other specified parts of digestive tract: Secondary | ICD-10-CM | POA: Insufficient documentation

## 2016-12-29 DIAGNOSIS — K4020 Bilateral inguinal hernia, without obstruction or gangrene, not specified as recurrent: Secondary | ICD-10-CM | POA: Diagnosis not present

## 2016-12-29 DIAGNOSIS — R590 Localized enlarged lymph nodes: Secondary | ICD-10-CM | POA: Insufficient documentation

## 2016-12-29 NOTE — Progress Notes (Signed)
Hematology/Oncology Consult note [facility] [phone] [phone]  Patient Care Team: Idelle Crouch, MD as PCP - General (Internal Medicine) Clent Jacks, RN as Registered Nurse   Name of the patient: Casey Acevedo  [id number]  Apr 09, 1940    Reason for referral- new diagnosis of [facility] esophagus   Referring physician- Dr. Vicente Males  Date of visit: 12/29/16   History of presenting illness- 1. Patient is a 76 year old gentleman with no significant past medical history and takes no medications he has a remote history of smokingduring his college days and quit smoking thereafter. No history of any alcohol intake. He was recently admitted to the hospital on 12/06/2016 with symptoms of progressive dysphagia which he states has been going on since the starting of September. His dysphagia got to the point that he began to have pain even on swallowing icecream. He underwent EGD at that time which showed but high 2 cm long stricture 2 cm above the GE junction. Biopsies at that time were negative for malignancy. Given that he was unable to eat he was started on TPN at that time and plan was to get a repeat EGD with possible biopsy  2. Patient underwent repeat EGD on 12/26/2016 where he was again noted to have a lower esophageal stricture which reduced the lumen to less than 3-4 mm. There was no evidence of Barrett's esophagus. 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 Shortly after the stricture was dilated the stricture did close up significantly. Postprocedure patient complained of abdominal pain and there was a concern for perforation and hence a repeat CT abdomen was obtained which did not show any evidence of perforation  3. CT abdomen pelvis as well as CT chest with contrast on 10/01/2018showed: 7 mm sub pleural nodule in the lingula. No evidence of  axillary mediastinal or hilar adenopathy.1.3 cm soft tissue nodule adjacent to the GE junctionwhich is favored to represent an enlarged lymph node potentially active or metastatic in etiology. Narrowing involving thickening of the distal esophagus compatible with known distal esophageal stricture  4. Patient has been referred to [place] for further management. He is tolerating his TPN well and otherwise reports no complaints. He lives with his wife and is independent of his ADLs and IADLs. Besides dysphagia patient reports no loss of appetite or unintentional weight loss over the last few months. He denies any pain  ECOG PS- 0  Pain scale- 0   Review of systems- Review of Systems  Constitutional: Negative for chills, fever, malaise/fatigue and weight loss.  HENT: Negative for congestion, ear discharge and nosebleeds.   Eyes: Negative for blurred vision.  Respiratory: Negative for cough, hemoptysis, sputum production, shortness of breath and wheezing.   Cardiovascular: Negative for chest pain, palpitations, orthopnea and claudication.  Gastrointestinal: Negative for abdominal pain, blood in stool, constipation, diarrhea, heartburn, melena, nausea and vomiting.       Dysphagia  Genitourinary: Negative for dysuria, flank pain, frequency, hematuria and urgency.  Musculoskeletal: Negative for back pain, joint pain and myalgias.  Skin: Negative for rash.  Neurological: Negative for dizziness, tingling, focal weakness, seizures, weakness and headaches.  Endo/Heme/Allergies: Does not bruise/bleed easily.  Psychiatric/Behavioral: Negative for depression and suicidal ideas. The patient does not have insomnia.     No Known Allergies  Patient Active Problem List   Diagnosis Date Noted  . Protein-calorie malnutrition, severe 12/07/2016  .  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 Dysphagia 12/06/2016  . GERD (gastroesophageal reflux disease) 06/01/2016     Past Medical History:  Diagnosis Date  .  Dysphagia   . GERD (gastroesophageal reflux disease)      Past Surgical History:  Procedure Laterality Date  . CHOLECYSTECTOMY    . ESOPHAGOGASTRODUODENOSCOPY (EGD) WITH PROPOFOL N/A 12/07/2016   Procedure: ESOPHAGOGASTRODUODENOSCOPY (EGD) WITH PROPOFOL;  Surgeon: Jonathon Bellows, MD;  Location: [facility] ENDOSCOPY;  Service: Gastroenterology;  Laterality: N/A;  . ESOPHAGOGASTRODUODENOSCOPY (EGD) WITH PROPOFOL N/A 12/26/2016   Procedure: ESOPHAGOGASTRODUODENOSCOPY (EGD) WITH PROPOFOL WITH DILATION;  Surgeon: Jonathon Bellows, MD;  Location: [facility] ENDOSCOPY;  Service: Gastroenterology;  Laterality: N/A;  . EYE SURGERY      Social History   Social History  . Marital status: Married    Spouse name: N/A  . Number of children: N/A  . Years of education: N/A   Occupational History  . Not on file.   Social History Main Topics  . Smoking status: Never Smoker  . Smokeless tobacco: Never Used  . Alcohol use No  . Drug use: No  . Sexual activity: Not Currently   Other Topics Concern  . Not on file   Social History Narrative   Independent at baseline. Ambulatory     Family History  Problem Relation Age of Onset  . Heart failure Father   . Prostate cancer Neg Hx   . Bladder Cancer Neg Hx   . Kidney cancer Neg Hx     No current outpatient prescriptions on file.   Physical exam:  Vitals:   12/29/16 0944  BP: 112/79  Pulse: 99  Resp: 14  Temp: 98.4 F (36.9 C)  TempSrc: Tympanic  Weight: 183 lb 6.4 oz (83.2 kg)   Physical Exam  Constitutional: He is oriented to person, place, and time and well-developed, well-nourished, and in no distress.  HENT:  Head: Normocephalic and atraumatic.  Eyes: Pupils are equal, round, and reactive to light. EOM are normal.  Neck: Normal range of motion.  Cardiovascular: Normal rate, regular rhythm and normal heart sounds.   Pulmonary/Chest: Effort normal and breath sounds normal.  Abdominal: Soft. Bowel sounds are normal.  Musculoskeletal:  Right  arm line for TPN in place  Lymphadenopathy:  No palpable cervical or supraclavicular adenopathy  Neurological: He is alert and oriented to person, place, and time.  Skin: Skin is warm and dry.       CMP Latest Ref Rng & Units 12/12/2016  Glucose 65 - 99 mg/dL 138(H)  BUN 6 - 20 mg/dL 29(H)  Creatinine 0.61 - 1.24 mg/dL 0.97  Sodium 135 - 145 mmol/L 137  Potassium 3.5 - 5.1 mmol/L 3.7  Chloride 101 - 111 mmol/L 108  CO2 22 - 32 mmol/L 23  Calcium 8.9 - 10.3 mg/dL 8.7(L)  Total Protein 6.5 - 8.1 g/dL 6.1(L)  Total Bilirubin 0.3 - 1.2 mg/dL 0.6  Alkaline Phos 38 - 126 U/L 53  AST 15 - 41 U/L 11(L)  ALT 17 - 63 U/L 14(L)   CBC Latest Ref Rng & Units 12/11/2016  WBC 3.8 - 10.6 K/uL 7.1  Hemoglobin 13.0 - 18.0 g/dL 14.6  Hematocrit 40.0 - 52.0 % 42.4  Platelets 150 - 440 K/uL 175    No images are attached to the encounter.  Ct Abdomen Wo Contrast  Result Date: 12/26/2016 CLINICAL DATA:  Esophageal cancer. Gastroesophageal reflux disease. Dysphagia. Endoscopy from earlier today demonstrating esophageal stenosis at approximately 40 cm from the incisors. Rule out  perforation of esophagus. EXAM: CT CHEST, ABDOMEN WITH CONTRAST TECHNIQUE: Multidetector CT imaging of the chest, abdomen was performed following the standard protocol during bolus administration of intravenous contrast. CONTRAST:  75 cc of Isovue-300 COMPARISON:  Esophagram of earlier today.  Prior CTs of 12/11/2016. FINDINGS: CT CHEST FINDINGS Cardiovascular: Aortic and branch vessel atherosclerosis. Normal heart size, without pericardial effusion. Multivessel coronary artery atherosclerosis. A right-sided PICC line terminates at the high right atrium. Mediastinum/Nodes: No supraclavicular adenopathy. No mediastinal or definite hilar adenopathy, given limitations of unenhanced CT. Soft tissue fullness at the distal esophagus including on image 50/series 2. This is similar. The more proximal esophagus is dilated and contrast filled  from today's esophagram. No extraluminal contrast identified. No pneumomediastinum. Lungs/Pleura: No pleural fluid. Right upper lobe subpleural calcified granuloma on image 65/series 4. 5 mm lingular nodule on image 97/series 4 is is similar to on the prior. Minimal subpleural left lower lobe nodularity at 3 mm on image 127/series 4. Not readily apparent on the prior, favored to represent a subpleural lymph node. Musculoskeletal: No acute osseous abnormality. CT ABDOMEN  FINDINGS Hepatobiliary: High left hepatic lobe subcentimeter low-density lesion is likely a cyst. Cholecystectomy, without biliary ductal dilatation. Pancreas: Pancreatic atrophy, without duct dilatation or dominant mass. Spleen: Old granulomatous disease in the spleen. Adrenals/Urinary Tract: Normal adrenal glands. No renal calculi or hydronephrosis. Stomach/Bowel: Normal appearance of the stomach. Normal abdominal bowel loops. Vascular/Lymphatic: Aortic and branch vessel atherosclerosis. Gastrohepatic ligament adenopathy is again identified, including at 1.3 cm on image 53/series 2. Other:  No ascites.  No abdominal peritoneal or omental metastasis. Musculoskeletal: No acute osseous abnormality. IMPRESSION: 1. Soft tissue fullness in the distal esophagus, likely representing the primary site of malignancy. This is at least partially obstructive, with contrast from today's esophagram identified within a dilated more superior esophagus. 2. Isolated gastrohepatic ligament adenopathy, highly suspicious for metastatic disease. 3. No evidence of esophageal perforation. 4. Nonspecific lingular nodule, similar. 5. Coronary artery atherosclerosis. Aortic Atherosclerosis (ICD10-I70.0). Electronically Signed   By: Abigail Miyamoto M.D.   On: 12/26/2016 14:58   Ct Chest Wo Contrast  Result Date: 12/26/2016 CLINICAL DATA:  Esophageal cancer. Gastroesophageal reflux disease. Dysphagia. Endoscopy from earlier today demonstrating esophageal stenosis at  approximately 40 cm from the incisors. Rule out perforation of esophagus. EXAM: CT CHEST, ABDOMEN WITH CONTRAST TECHNIQUE: Multidetector CT imaging of the chest, abdomen was performed following the standard protocol during bolus administration of intravenous contrast. CONTRAST:  75 cc of Isovue-300 COMPARISON:  Esophagram of earlier today.  Prior CTs of 12/11/2016. FINDINGS: CT CHEST FINDINGS Cardiovascular: Aortic and branch vessel atherosclerosis. Normal heart size, without pericardial effusion. Multivessel coronary artery atherosclerosis. A right-sided PICC line terminates at the high right atrium. Mediastinum/Nodes: No supraclavicular adenopathy. No mediastinal or definite hilar adenopathy, given limitations of unenhanced CT. Soft tissue fullness at the distal esophagus including on image 50/series 2. This is similar. The more proximal esophagus is dilated and contrast filled from today's esophagram. No extraluminal contrast identified. No pneumomediastinum. Lungs/Pleura: No pleural fluid. Right upper lobe subpleural calcified granuloma on image 65/series 4. 5 mm lingular nodule on image 97/series 4 is is similar to on the prior. Minimal subpleural left lower lobe nodularity at 3 mm on image 127/series 4. Not readily apparent on the prior, favored to represent a subpleural lymph node. Musculoskeletal: No acute osseous abnormality. CT ABDOMEN  FINDINGS Hepatobiliary: High left hepatic lobe subcentimeter low-density lesion is likely a cyst. Cholecystectomy, without biliary ductal dilatation. Pancreas: Pancreatic atrophy, without duct  dilatation or dominant mass. Spleen: Old granulomatous disease in the spleen. Adrenals/Urinary Tract: Normal adrenal glands. No renal calculi or hydronephrosis. Stomach/Bowel: Normal appearance of the stomach. Normal abdominal bowel loops. Vascular/Lymphatic: Aortic and branch vessel atherosclerosis. Gastrohepatic ligament adenopathy is again identified, including at 1.3 cm on image  53/series 2. Other:  No ascites.  No abdominal peritoneal or omental metastasis. Musculoskeletal: No acute osseous abnormality. IMPRESSION: 1. Soft tissue fullness in the distal esophagus, likely representing the primary site of malignancy. This is at least partially obstructive, with contrast from today's esophagram identified within a dilated more superior esophagus. 2. Isolated gastrohepatic ligament adenopathy, highly suspicious for metastatic disease. 3. No evidence of esophageal perforation. 4. Nonspecific lingular nodule, similar. 5. Coronary artery atherosclerosis. Aortic Atherosclerosis (ICD10-I70.0). Electronically Signed   By: Abigail Miyamoto M.D.   On: 12/26/2016 14:58   Ct Chest W Contrast  Result Date: 12/11/2016 CLINICAL DATA:  Patient with progressive difficulty swallowing. EXAM: CT CHEST, ABDOMEN, AND PELVIS WITH CONTRAST TECHNIQUE: Multidetector CT imaging of the chest, abdomen and pelvis was performed following the standard protocol during bolus administration of intravenous contrast. CONTRAST:  139mL ISOVUE-300 IOPAMIDOL (ISOVUE-300) INJECTION 61% COMPARISON:  CT abdomen pelvis 05/11/2006 FINDINGS: CT CHEST FINDINGS Cardiovascular: Normal heart size. Coronary arterial vascular calcifications. Thoracic aortic vascular calcifications. Right upper extremity PICC line tip terminates in the superior vena cava. Mediastinum/Nodes: No enlarged axillary, mediastinal or hilar lymphadenopathy. There is wall thickening and narrowing of the distal esophagus (image 50; series 2). Lungs/Pleura: There is a 7 mm subpleural nodule in the lingula (image 96; series 4). Calcified granuloma within the medial right upper lobe (image 63; series 4). Dependent atelectasis/ scarring within the lower lobes bilaterally. No pleural effusion or pneumothorax. Musculoskeletal: Thoracic spine degenerative changes. No aggressive or acute appearing osseous lesions. CT ABDOMEN PELVIS FINDINGS Hepatobiliary: The liver is normal in  size and contour. Subcentimeter too small to characterize low-attenuation lesion left hepatic lobe (image 50 ; series 2). Fatty deposition adjacent to the falciform ligament. Status post cholecystectomy. No intrahepatic or extrahepatic biliary ductal dilatation. Pancreas: Mildly atrophic. Spleen: Calcified granulomas in the spleen. Adrenals/Urinary Tract: The adrenal glands are normal. Kidneys enhance symmetrically with contrast. No hydronephrosis. Urinary bladder is unremarkable. Stomach/Bowel: Colon is decompressed, limiting evaluation. Suggestion of mild colonic wall thickening. Normal morphology of the stomach. There is a 1.3 cm soft tissue nodule adjacent to the GE junction (image 53; series 2). Vascular/Lymphatic: Normal caliber abdominal aorta. Peripheral calcified atherosclerotic plaque. No retroperitoneal lymphadenopathy. Reproductive: Prostate unremarkable. Other: Bilateral fat containing inguinal hernias. Musculoskeletal: Lumbar spine degenerative changes. No aggressive or acute appearing osseous lesions. IMPRESSION: There is a 1.3 cm soft tissue nodule adjacent to the GE junction. This is favored to represent an enlarged lymph node, potentially reactive or metastatic in etiology. The colon is decompressed however there is suggestion of wall thickening, raising the possibility of colitis. There is a 7 mm nodule within the left upper lobe. Non-contrast chest CT at 6-12 months is recommended. If the nodule is stable at time of repeat CT, then future CT at 18-24 months (from today's scan) is considered optional for low-risk patients, but is recommended for high-risk patients. This recommendation follows the consensus statement: Guidelines for Management of Incidental Pulmonary Nodules Detected on CT Images: From the Fleischner Society 2017; Radiology 2017; 284:228-243. Narrowing and wall thickening of the distal esophagus, compatible with known distal esophageal stricture. Electronically Signed   By: Lovey Newcomer M.D.   On: 12/11/2016 20:02   Ct  Abdomen Pelvis W Contrast  Result Date: 12/11/2016 CLINICAL DATA:  Patient with progressive difficulty swallowing. EXAM: CT CHEST, ABDOMEN, AND PELVIS WITH CONTRAST TECHNIQUE: Multidetector CT imaging of the chest, abdomen and pelvis was performed following the standard protocol during bolus administration of intravenous contrast. CONTRAST:  118mL ISOVUE-300 IOPAMIDOL (ISOVUE-300) INJECTION 61% COMPARISON:  CT abdomen pelvis 05/11/2006 FINDINGS: CT CHEST FINDINGS Cardiovascular: Normal heart size. Coronary arterial vascular calcifications. Thoracic aortic vascular calcifications. Right upper extremity PICC line tip terminates in the superior vena cava. Mediastinum/Nodes: No enlarged axillary, mediastinal or hilar lymphadenopathy. There is wall thickening and narrowing of the distal esophagus (image 50; series 2). Lungs/Pleura: There is a 7 mm subpleural nodule in the lingula (image 96; series 4). Calcified granuloma within the medial right upper lobe (image 63; series 4). Dependent atelectasis/ scarring within the lower lobes bilaterally. No pleural effusion or pneumothorax. Musculoskeletal: Thoracic spine degenerative changes. No aggressive or acute appearing osseous lesions. CT ABDOMEN PELVIS FINDINGS Hepatobiliary: The liver is normal in size and contour. Subcentimeter too small to characterize low-attenuation lesion left hepatic lobe (image 50 ; series 2). Fatty deposition adjacent to the falciform ligament. Status post cholecystectomy. No intrahepatic or extrahepatic biliary ductal dilatation. Pancreas: Mildly atrophic. Spleen: Calcified granulomas in the spleen. Adrenals/Urinary Tract: The adrenal glands are normal. Kidneys enhance symmetrically with contrast. No hydronephrosis. Urinary bladder is unremarkable. Stomach/Bowel: Colon is decompressed, limiting evaluation. Suggestion of mild colonic wall thickening. Normal morphology of the stomach. There is a 1.3 cm soft  tissue nodule adjacent to the GE junction (image 53; series 2). Vascular/Lymphatic: Normal caliber abdominal aorta. Peripheral calcified atherosclerotic plaque. No retroperitoneal lymphadenopathy. Reproductive: Prostate unremarkable. Other: Bilateral fat containing inguinal hernias. Musculoskeletal: Lumbar spine degenerative changes. No aggressive or acute appearing osseous lesions. IMPRESSION: There is a 1.3 cm soft tissue nodule adjacent to the GE junction. This is favored to represent an enlarged lymph node, potentially reactive or metastatic in etiology. The colon is decompressed however there is suggestion of wall thickening, raising the possibility of colitis. There is a 7 mm nodule within the left upper lobe. Non-contrast chest CT at 6-12 months is recommended. If the nodule is stable at time of repeat CT, then future CT at 18-24 months (from today's scan) is considered optional for low-risk patients, but is recommended for high-risk patients. This recommendation follows the consensus statement: Guidelines for Management of Incidental Pulmonary Nodules Detected on CT Images: From the Fleischner Society 2017; Radiology 2017; 284:228-243. Narrowing and wall thickening of the distal esophagus, compatible with known distal esophageal stricture. Electronically Signed   By: Lovey Newcomer M.D.   On: 12/11/2016 20:02   Dg Esophagus  Result Date: 12/06/2016 CLINICAL DATA:  Difficulty swallowing, constant vomiting EXAM: ESOPHOGRAM/BARIUM SWALLOW TECHNIQUE: Single contrast examination was performed using  thick barium. FLUOROSCOPY TIME:  Fluoroscopy Time:  0.5 minute Radiation Exposure Index (if provided by the fluoroscopic device): 5.7 mGy Number of Acquired Spot Images: 0 COMPARISON:  None. FINDINGS: There was normal pharyngeal anatomy and motility. Patient is extremely nauseous. Mild esophageal dilatation with a high-grade stricture of the distal esophagus with irregular margins. IMPRESSION: 1. High-grade stricture  of the distal esophagus with irregular margins. 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 Recommend upper endoscopy. Electronically Signed   By: Kathreen Devoid   On: 12/06/2016 11:53   Dg C-arm 1-60 Min-no Report  Result Date: 12/26/2016 Fluoroscopy was utilized by the requesting physician.  No radiographic interpretation.   Dg Esophagus W/water  Sol Cm  Result Date: 12/26/2016 CLINICAL DATA:  Recent esophageal instrumentation. Pain. Evaluate for esophageal rupture. EXAM: ESOPHOGRAM/BARIUM SWALLOW TECHNIQUE: Single contrast examination was performed using water-soluble contrast. FLUOROSCOPY TIME:  1 minutes and 6 seconds COMPARISON:  None. FINDINGS: The patient ingested water-soluble contrast and imaging was obtained. There is an obstruction of the distal esophagus, just above the gastroesophageal junction. A fluid level exists in the esophagus. Contrast never traversed beyond the distal esophagus. The patient vomited. There is no evidence of extravasation to suggest leak or rupture. IMPRESSION: No evidence of contrast extravasation to suggest esophageal injury or perforation There is abrupt obstruction of the distal esophagus just above the gastroesophageal junction. Electronically Signed   By: Marybelle Killings M.D.   On: 12/26/2016 14:46    Assessment and plan- Patient is a 76 y.o. male with newly diagnosed SCC of the esophagus  1. I have personally reviewed the CT thorax and CT abdomen images and report and discussed that the patient in detail. Also discussed the pathology findings with him and his wife today. Patient has biopsy-proven squamous cell carcinoma of the esophagus and possible adenopathy given soft tissue enlargement adjacent to the GE junction representing metastatic adenopathy. At this time I will proceed with a PET CT scan for complete staging workup  2. If PET/CT scan does not show any evidence of metastatic disease- I will refer him to Dr.  Servando Snare- from [facility] cardiothoracic surgery to see if he will be a candidate for definitive esophagectomy and pull through surgery. If he is a candidate for surgery, I would recommend neoadjuvant concurrent chemoradiation with carboplatin AUC 2 IV along with Taxol at 50 mg/m IV weekly along with radiation per CROSS trial. Post-chemoradiation patient will get repeat imaging to a certain no evidence of metastatic disease prior to surgery.  Also if he is a candidate for surgery- I would recommend J tube placement by cardiothoracic surgery so we can transition him to enteral nutrition instead of TPN and eventually get him off TPN  If there is no evidence of metastatic disease and he is deemed to be a candidate for surgery- He would also benefit from EUS for T and N staging prior to start of chemo/RT and I will refer him to Dr. Ardis Hughs from [facility] for the same  If he does not have metastatic disease but is not considered a surgical candidate, I will still proceed with definitive chemo/RT with carbo/taxol RT regimen as above  3. If PET/CT scan shows evidence of metastatic disease- he will be a candidate for palliative systemic therapy but may still benefit from palliative radiation therapy to palliate his dysphagia. In this scenario I would prefer placement of a PEG tube by surgery to facilitate enteral nutrition instead of TPN  4. I will proceed with port placement at this time in anticipation of systemic chemotherapy. I will also refer him to radiation oncology in anticipation of possible concurrent chemo/RT. I will discuss his case at the tumor board next week  5. I will also refer him to a dietitian at this time  I will tentatively see him back after his PET/Ct to discuss chemotherapy in further detail   Total face to face encounter time for this patient visit was 60 minutes min. >50% of the time was  spent in counseling and coordination of care.     Thank you for this kind referral and the  opportunity to participate in the care of this patient   Visit Diagnosis 1. Malignant neoplasm  of esophagus, unspecified location [facility])     Dr. Randa Evens, MD, MPH [facility] Pager- [phone] 12/29/2016 3:40 PM

## 2016-12-29 NOTE — Progress Notes (Signed)
  Oncology Nurse Navigator Documentation Met with Casey Acevedo after consult with Dr. Janese Banks. Introduced Therapist, nutritional and provided contact information for any future questions/needs/support. Plan of care includes PET next week, dietician referral, referral for port a cath, referral to cardiothoracic surgery, Radiation Oncology. Navigator Location: [facility] (12/29/16 1000)   )Navigator Encounter Type: Initial MedOnc (12/29/16 1000)                     Patient Visit Type: Initial;MedOnc (12/29/16 1000)   Barriers/Navigation Needs: Coordination of Care (12/29/16 1000)   Interventions: Referrals;Coordination of Care (12/29/16 1000)                      Time Spent with Patient: 15 (12/29/16 1000)

## 2017-01-02 ENCOUNTER — Telehealth: Payer: Self-pay | Admitting: Gastroenterology

## 2017-01-02 NOTE — Telephone Encounter (Signed)
Please call the Pharmacist Chong Sicilian) with [facility] [phone]. She LVM.

## 2017-01-03 ENCOUNTER — Encounter
Admission: RE | Admit: 2017-01-03 | Discharge: 2017-01-03 | Disposition: A | Discharge status: Home / Self Care | Payer: Medicare Other | Source: Ambulatory Visit | Attending: Oncology | Admitting: Oncology

## 2017-01-03 ENCOUNTER — Institutional Professional Consult (permissible substitution) (INDEPENDENT_AMBULATORY_CARE_PROVIDER_SITE_OTHER): Payer: Medicare Other | Admitting: Cardiothoracic Surgery

## 2017-01-03 ENCOUNTER — Encounter: Payer: Self-pay | Admitting: Cardiothoracic Surgery

## 2017-01-03 VITALS — BP 129/85 | HR 94 | Ht 69.0 in | Wt 180.0 lb

## 2017-01-03 DIAGNOSIS — C159 Malignant neoplasm of esophagus, unspecified: Secondary | ICD-10-CM | POA: Diagnosis present

## 2017-01-03 DIAGNOSIS — C155 Malignant neoplasm of lower third of esophagus: Secondary | ICD-10-CM | POA: Diagnosis not present

## 2017-01-03 LAB — GLUCOSE, CAPILLARY: Glucose-Capillary: 127 mg/dL — ABNORMAL HIGH (ref 65–99)

## 2017-01-03 IMAGING — CT NM PET TUM IMG INITIAL (PI) SKULL BASE T - THIGH
1 of 10 series · 1 of 25 positions shown · non-contrast
Comparison: CT chest abdomen pelvis [DATE].

CLINICAL DATA: Initial treatment strategy for esophageal cancer.

EXAM:
NUCLEAR MEDICINE PET SKULL BASE TO THIGH
TECHNIQUE: Twelve point for mCi F-18 FDG was injected intravenously. Full-ring
PET imaging was performed from the skull base to thigh after the
radiotracer. CT data was obtained and used for attenuation
correction and anatomic localization.
FASTING BLOOD GLUCOSE:  Value: 127 mg/dl

[Series 3: ct wb 5.0 b30f · axial · 5.0mm · 0.98mm/px · 1 of 329 slices shown]
[im 329/329  brain]
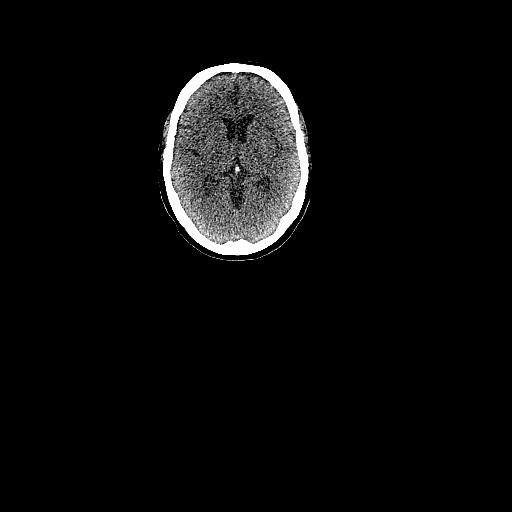

[1 of 25 positions shown; findings below may reference images not displayed]

FINDINGS: NECK: No hypermetabolic lymph nodes in the neck. CT images show no
acute findings.

CHEST: No hypermetabolic mediastinal, hilar or axillary lymph nodes.
Distal esophageal mass measures approximately 2.5 x 2.7 cm with an
SUV max of 8.0. No adjacent hypermetabolic adenopathy. No
hypermetabolic pulmonary nodules.

Atherosclerotic calcification of the arterial vasculature, including
coronary arteries. Right PICC tip terminates at the SVC RA junction.
Heart is mildly enlarged. No pericardial or pleural effusion.
Esophagus is dilated throughout its course, to the level of the
above-described esophageal mass.

ABDOMEN/PELVIS: No abnormal hypermetabolism in the liver, adrenal
glands, spleen or pancreas. No hypermetabolic lymph nodes.

Subcentimeter low-attenuation lesion in the dome of the liver is too
small to characterize. Cholecystectomy. Adrenal glands, kidneys,
spleen, pancreas, stomach and bowel are grossly unremarkable.
Atherosclerotic calcification of the arterial vasculature without
aneurysm. Prostate is mildly enlarged. No free fluid.

SKELETON: No abnormal osseous hypermetabolism.
IMPRESSION: 1. Hypermetabolic distal esophageal mass with associated esophageal
dilatation. No hypermetabolic adenopathy or distant metastatic
disease.
2. Aortic atherosclerosis ([X4]-170.0). Coronary artery
calcification.

## 2017-01-03 MED ORDER — FLUDEOXYGLUCOSE F - 18 (FDG) INJECTION
12.0000 | Freq: Once | INTRAVENOUS | Status: AC | PRN
  Administered 2017-01-03: 12.4 via INTRAVENOUS

## 2017-01-03 NOTE — Progress Notes (Signed)
[address]       [place] [address]             [id number]                    Tayjon D Baltazar [place] Medical Record #[id number] Date of Birth: 11-21-1940  Referring: Berton Lan, MD Primary Care: Idelle Crouch, MD  Chief Complaint: Advanced stage esophageal cancer  History of Present Illness:    Casey Acevedo 76 y.o. male is seen in the office  today for evaluation of recent diagnosis of esophageal cancer.  The patient has a 3 month history of dysphagia .  Patient.  Complains approximately 10 pound weight loss .  He underwent endoscopy at the end of September with negative biopsy for malignancy .  He was started on home TNA at that time .  October 16 repeat endoscopy with dilatation was performed .  The patient notes severe chest pain following the dilatation he said "as closeto death as possible".  Repeat scans showed no evidence of perforation.  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  Treated with PICC and TPN.    Patient denies any lifelong problem with reflux, he does note that his 73 year old brother is alive with history of Barrett's Previous surgery includes only laparoscopic cholecystectomy in 2009  Patient has very few in the way of long-term medical problems, notes he takes no prescription medications, has had no previous cardiac history. He smoked briefly while in college but none for more than 50 years.  Denies any alcohol intake for at least the last 25 years.  Current Activity/ Functional Status:  Patient is independent with mobility/ambulation, transfers, ADL's, IADL's.   Zubrod Score: At the time of surgery this patient's most appropriate activity status/level should be described as: []     0    Normal activity, no symptoms [x]     1    Restricted in physical strenuous activity but ambulatory, able to do out light work []     2    Ambulatory and capable of self care, unable to do work activities, up and  about               >50 % of waking hours                              []     3    Only limited self care, in bed greater than 50% of waking hours []     4    Completely disabled, no self care, confined to bed or chair []     5    Moribund   Past Medical History:  Diagnosis Date  . Dysphagia   . GERD (gastroesophageal reflux disease)     Past Surgical History:  Procedure Laterality Date  . CHOLECYSTECTOMY    . ESOPHAGOGASTRODUODENOSCOPY (EGD) WITH PROPOFOL N/A 12/07/2016   Procedure: ESOPHAGOGASTRODUODENOSCOPY (EGD) WITH PROPOFOL;  Surgeon: Jonathon Bellows, MD;  Location: [facility] ENDOSCOPY;  Service: Gastroenterology;  Laterality: N/A;  . ESOPHAGOGASTRODUODENOSCOPY (EGD) WITH PROPOFOL N/A 12/26/2016   Procedure: ESOPHAGOGASTRODUODENOSCOPY (EGD) WITH PROPOFOL WITH DILATION;  Surgeon: Jonathon Bellows, MD;  Location: [facility] ENDOSCOPY;  Service: Gastroenterology;  Laterality: N/A;  . EYE SURGERY      Family History  Problem Relation Age of Onset  . Heart failure Father   . Prostate cancer Neg Hx   .  Bladder Cancer Neg Hx   . Kidney cancer Neg Hx    Patient spotted at age 41 with complications after gallbladder surgery, mother died at age 50 with a perforated gastric ulcer and history of breast cancer, he has 1 brother at age 63 with alive with heart trouble and diabetes and also known Barrett's esophagus.  One brother died in the [place] in 42 at age 74 of myocardial infarction.  Patient has 1 daughter who is alive 1 son who expired from suicide   History  Smoking Status  . Never Smoker  Smokeless Tobacco  . 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  No Known Allergies  No current outpatient prescriptions on file.   No current facility-administered medications for this visit.     Pertinent items are  noted in HPI.   Review of Systems:     Cardiac Review of Systems: Y or N  Chest Pain [  y ]  Resting SOB [ n  ] Exertional SOB  [ n ]  Orthopnea [n  ]   Pedal Edema [ n  ]    Palpitations [n  ] Syncope  [n  ]   Presyncope [n   ]  General Review of Systems: [Y] = yes [  ]=no Constitional: recent weight change Blue.Reese  ];  Wt loss over the last 3 months [ 10  ] anorexia [  ]; fatigue [  ]; nausea [  ]; night sweats [  ]; fever [  ]; or chills [  ];          Dental: poor dentition[  ]; Last Dentist visit:   Eye : blurred vision [  ]; diplopia [   ]; vision changes [  ];  Amaurosis fugax[  ]; Resp: cough [n  ];  wheezing[ n ];  hemoptysis[n  ]; shortness of breath[ n ]; paroxysmal nocturnal dyspnea[ n ]; dyspnea on exertion[  ]; or orthopnea[  ];  GI:  Gallstones[y removed  ], vomiting[ y ];  dysphagia[ y ]; melena[ n ];  hematochezia [  ]; heartburn[ n ];   Hx of  Colonoscopy[ n ]; GU: kidney stones [  ]; hematuria[  ];   dysuria [  ];  nocturia[  ];  history of     obstruction [  ]; urinary frequency [  ]             Skin: rash, swelling[  ];, hair loss[  ];  peripheral edema[  ];  or itching[  ]; Musculosketetal: myalgias[  ];  joint swelling[  ];  joint erythema[  ];  joint pain[  ];  back pain[  ];  Heme/Lymph: bruising[  ];  bleeding[  ];  anemia[  ];  Neuro: TIA[n  ];  headaches[ n ];  stroke[  n];  vertigo[  ];  seizures[  ];   paresthesias[  ];  difficulty walking[  ];  Psych:depression[  ]; anxiety[  ];  Endocrine: diabetes[ n ];  thyroid dysfunction[ n ];  Immunizations: Flu up to date [  ]; Pneumococcal up to date [  ];  Other:  Physical Exam: BP 129/85   Pulse 94   Ht 5\' 9"  (1.753 m)   Wt 180 lb (81.6 kg)  SpO2 99%   BMI 26.58 kg/m   PHYSICAL EXAMINATION: General appearance: alert, cooperative, appears stated age and no distress Head: Normocephalic, without obvious abnormality, atraumatic Neck: no adenopathy, no carotid bruit, no JVD, supple, symmetrical, trachea midline and  thyroid not enlarged, symmetric, no tenderness/mass/nodules Lymph nodes: Cervical, supraclavicular, and axillary nodes normal. Resp: clear to auscultation bilaterally Back: symmetric, no curvature. ROM normal. No CVA tenderness. Cardio: regular rate and rhythm, S1, S2 normal, no murmur, click, rub or gallop GI: soft, non-tender; bowel sounds normal; no masses,  no organomegaly Extremities: extremities normal, atraumatic, no cyanosis or edema and Homans sign is negative, no sign of DVT Neurologic: Grossly normal  Diagnostic Studies & Laboratory data:     Recent Radiology Findings:   Nm Pet Image Initial (pi) Skull Base To Thigh  Result Date: 01/03/2017 CLINICAL DATA:  Initial treatment strategy for esophageal cancer. EXAM: NUCLEAR MEDICINE PET SKULL BASE TO THIGH TECHNIQUE: Twelve point for mCi F-18 FDG was injected intravenously. Full-ring PET imaging was performed from the skull base to thigh after the radiotracer. CT data was obtained and used for attenuation correction and anatomic localization. FASTING BLOOD GLUCOSE:  Value: 127 mg/dl COMPARISON:  CT chest abdomen pelvis 12/26/2016. FINDINGS: NECK: No hypermetabolic lymph nodes in the neck. CT images show no acute findings. CHEST: No hypermetabolic mediastinal, hilar or axillary lymph nodes. Distal esophageal mass measures approximately 2.5 x 2.7 cm with an SUV max of 8.0. No adjacent hypermetabolic adenopathy. No hypermetabolic pulmonary nodules. Atherosclerotic calcification of the arterial vasculature, including coronary arteries. Right PICC tip terminates at the SVC RA junction. Heart is mildly enlarged. No pericardial or pleural effusion. Esophagus is dilated throughout its course, to the level of the above-described esophageal mass. ABDOMEN/PELVIS: No abnormal hypermetabolism in the liver, adrenal glands, spleen or pancreas. No hypermetabolic lymph nodes. Subcentimeter low-attenuation lesion in the dome of the liver is too small to  characterize. Cholecystectomy. Adrenal glands, kidneys, spleen, pancreas, stomach and bowel are grossly unremarkable. Atherosclerotic calcification of the arterial vasculature without aneurysm. Prostate is mildly enlarged. No free fluid. SKELETON: No abnormal osseous hypermetabolism. IMPRESSION: 1. Hypermetabolic distal esophageal mass with associated esophageal dilatation. No hypermetabolic adenopathy or distant metastatic disease. 2. Aortic atherosclerosis (ICD10-170.0). Coronary artery calcification. Electronically Signed   By: Lorin Picket M.D.   On: 01/03/2017 14:44   Ct Chest Wo Contrast/ Ct Abdomen Wo Contrast  Result Date: 12/26/2016 CLINICAL DATA:  Esophageal cancer. Gastroesophageal reflux disease. Dysphagia. Endoscopy from earlier today demonstrating esophageal stenosis at approximately 40 cm from the incisors. Rule out perforation of esophagus. EXAM: CT CHEST, ABDOMEN WITH CONTRAST TECHNIQUE: Multidetector CT imaging of the chest, abdomen was performed following the standard protocol during bolus administration of intravenous contrast. CONTRAST:  75 cc of Isovue-300 COMPARISON:  Esophagram of earlier today.  Prior CTs of 12/11/2016. FINDINGS: CT CHEST FINDINGS Cardiovascular: Aortic and branch vessel atherosclerosis. Normal heart size, without pericardial effusion. Multivessel coronary artery atherosclerosis. A right-sided PICC line terminates at the high right atrium. Mediastinum/Nodes: No supraclavicular adenopathy. No mediastinal or definite hilar adenopathy, given limitations of unenhanced CT. Soft tissue fullness at the distal esophagus including on image 50/series 2. This is similar. The more proximal esophagus is dilated and contrast filled from today's esophagram. No extraluminal contrast identified. No pneumomediastinum. Lungs/Pleura: No pleural fluid. Right upper lobe subpleural calcified granuloma on image 65/series 4. 5 mm lingular nodule on image 97/series 4 is is similar to on the  prior. Minimal subpleural left lower lobe nodularity at 3 mm  on image 127/series 4. Not readily apparent on the prior, favored to represent a subpleural lymph node. Musculoskeletal: No acute osseous abnormality. CT ABDOMEN  FINDINGS Hepatobiliary: High left hepatic lobe subcentimeter low-density lesion is likely a cyst. Cholecystectomy, without biliary ductal dilatation. Pancreas: Pancreatic atrophy, without duct dilatation or dominant mass. Spleen: Old granulomatous disease in the spleen. Adrenals/Urinary Tract: Normal adrenal glands. No renal calculi or hydronephrosis. Stomach/Bowel: Normal appearance of the stomach. Normal abdominal bowel loops. Vascular/Lymphatic: Aortic and branch vessel atherosclerosis. Gastrohepatic ligament adenopathy is again identified, including at 1.3 cm on image 53/series 2. Other:  No ascites.  No abdominal peritoneal or omental metastasis. Musculoskeletal: No acute osseous abnormality. IMPRESSION: 1. Soft tissue fullness in the distal esophagus, likely representing the primary site of malignancy. This is at least partially obstructive, with contrast from today's esophagram identified within a dilated more superior esophagus. 2. Isolated gastrohepatic ligament adenopathy, highly suspicious for metastatic disease. 3. No evidence of esophageal perforation. 4. Nonspecific lingular nodule, similar. 5. Coronary artery atherosclerosis. Aortic Atherosclerosis (ICD10-I70.0). Electronically Signed   By: Abigail Miyamoto M.D.   On: 12/26/2016 14:58   Ct Chest W Contrast Ct Abdomen Pelvis W Contrast  Result Date: 12/11/2016 CLINICAL DATA:  Patient with progressive difficulty swallowing. EXAM: CT CHEST, ABDOMEN, AND PELVIS WITH CONTRAST TECHNIQUE: Multidetector CT imaging of the chest, abdomen and pelvis was performed following the standard protocol during bolus administration of intravenous contrast. CONTRAST:  167mL ISOVUE-300 IOPAMIDOL (ISOVUE-300) INJECTION 61% COMPARISON:  CT abdomen pelvis  05/11/2006 FINDINGS: CT CHEST FINDINGS Cardiovascular: Normal heart size. Coronary arterial vascular calcifications. Thoracic aortic vascular calcifications. Right upper extremity PICC line tip terminates in the superior vena cava. Mediastinum/Nodes: No enlarged axillary, mediastinal or hilar lymphadenopathy. There is wall thickening and narrowing of the distal esophagus (image 50; series 2). Lungs/Pleura: There is a 7 mm subpleural nodule in the lingula (image 96; series 4). Calcified granuloma within the medial right upper lobe (image 63; series 4). Dependent atelectasis/ scarring within the lower lobes bilaterally. No pleural effusion or pneumothorax. Musculoskeletal: Thoracic spine degenerative changes. No aggressive or acute appearing osseous lesions. CT ABDOMEN PELVIS FINDINGS Hepatobiliary: The liver is normal in size and contour. Subcentimeter too small to characterize low-attenuation lesion left hepatic lobe (image 50 ; series 2). Fatty deposition adjacent to the falciform ligament. Status post cholecystectomy. No intrahepatic or extrahepatic biliary ductal dilatation. Pancreas: Mildly atrophic. Spleen: Calcified granulomas in the spleen. Adrenals/Urinary Tract: The adrenal glands are normal. Kidneys enhance symmetrically with contrast. No hydronephrosis. Urinary bladder is unremarkable. Stomach/Bowel: Colon is decompressed, limiting evaluation. Suggestion of mild colonic wall thickening. Normal morphology of the stomach. There is a 1.3 cm soft tissue nodule adjacent to the GE junction (image 53; series 2). Vascular/Lymphatic: Normal caliber abdominal aorta. Peripheral calcified atherosclerotic plaque. No retroperitoneal lymphadenopathy. Reproductive: Prostate unremarkable. Other: Bilateral fat containing inguinal hernias. Musculoskeletal: Lumbar spine degenerative changes. No aggressive or acute appearing osseous lesions. IMPRESSION: There is a 1.3 cm soft tissue nodule adjacent to the GE junction. This is  favored to represent an enlarged lymph node, potentially reactive or metastatic in etiology. The colon is decompressed however there is suggestion of wall thickening, raising the possibility of colitis. There is a 7 mm nodule within the left upper lobe. Non-contrast chest CT at 6-12 months is recommended. If the nodule is stable at time of repeat CT, then future CT at 18-24 months (from today's scan) is considered optional for low-risk patients, but is recommended for high-risk patients. This recommendation follows the  consensus statement: Guidelines for Management of Incidental Pulmonary Nodules Detected on CT Images: From the Fleischner Society 2017; Radiology 2017; 284:228-243. Narrowing and wall thickening of the distal esophagus, compatible with known distal esophageal stricture. Electronically Signed   By: Lovey Newcomer M.D.   On: 12/11/2016 20:02   Dg Esophagus  Result Date: 12/06/2016 CLINICAL DATA:  Difficulty swallowing, constant vomiting EXAM: ESOPHOGRAM/BARIUM SWALLOW TECHNIQUE: Single contrast examination was performed using  thick barium. FLUOROSCOPY TIME:  Fluoroscopy Time:  0.5 minute Radiation Exposure Index (if provided by the fluoroscopic device): 5.7 mGy Number of Acquired Spot Images: 0 COMPARISON:  None. FINDINGS: There was normal pharyngeal anatomy and motility. Patient is extremely nauseous. Mild esophageal dilatation with a high-grade stricture of the distal esophagus with irregular margins. IMPRESSION: 1. 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 Recommend upper endoscopy. Electronically Signed   By: Kathreen Devoid   On: 12/06/2016 11:53   Dg Esophagus W/water Sol Cm  Result Date: 12/26/2016 CLINICAL DATA:  Recent esophageal instrumentation. Pain. Evaluate for esophageal rupture. EXAM: ESOPHOGRAM/BARIUM SWALLOW TECHNIQUE: Single contrast examination was performed using  water-soluble contrast. FLUOROSCOPY TIME:  1 minutes and 6 seconds COMPARISON:  None. FINDINGS: The patient ingested water-soluble contrast and imaging was obtained. There is an obstruction of the distal esophagus, just above the gastroesophageal junction. A fluid level exists in the esophagus. Contrast never traversed beyond the distal esophagus. The patient vomited. There is no evidence of extravasation to suggest leak or rupture. IMPRESSION: No evidence of contrast extravasation to suggest esophageal injury or perforation There is abrupt obstruction of the distal esophagus just above the gastroesophageal junction. Electronically Signed   By: Marybelle Killings M.D.   On: 12/26/2016 14:46     I have independently reviewed the above radiology studies  and reviewed the findings with the patient.   Recent Lab Findings: Lab Results  Component Value Date   WBC 7.1 12/11/2016   HGB 14.6 12/11/2016   HCT 42.4 12/11/2016   PLT 175 12/11/2016   GLUCOSE 138 (H) 12/12/2016   TRIG 92 12/11/2016   ALT 14 (L) 12/12/2016   AST 11 (L) 12/12/2016   NA 137 12/12/2016   K 3.7 12/12/2016   CL 108 12/12/2016   CREATININE 0.97 12/12/2016   BUN 29 (H) 12/12/2016   CO2 23 12/12/2016   INR 1.03 12/06/2016   Path 12/26/2016  Collected: 12/26/16 1152  Resulting lab: [id number]  Value: Surgical Pathology  CASE: [id number]  PATIENT: Eliseo Gum  Surgical Pathology Report      SPECIMEN SUBMITTED:  A. 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 ESOPHAGUS STRICTURE; COLD BIOPSY:  - SQUAMOUS CELL CARCINOMA.   Comment:  These findings were communicated to Dr. Vicente Males on 12/27/2016.   GROSS DESCRIPTION:   A. Labeled: esophageal stricture C BX   Tissue fragment(s): 3   Size: 0.1-0.4 cm   Description: pink fragments   Entirely submitted in 1  cassette(s).     Final Diagnosis performed by Quay Burow, MD. Electronically signed  12/27/2016 2:05:42PM     The electronic signature indicates that the named Attending Pathologist  has evaluated the specimen   Technical component performed at [facility], [address], [place],  [place] [address]  Lab: [phone] Dir: Darrick Penna. Evette Doffing, MD  Professional component performed at [facility], [facility],  [address], [place], [place] [address]  Lab: [phone] Dir: Dellia Nims. Rubinas, MD    ENDO:12/26/2016 One severe stenosis was found 38 to 40 cm from the incisors. This measured 6 mm (inner diameter) x 2 cm (in length) and was traversed after downsizing scope and dilating. A guide wire was placed, then the scope was withdrawn. Using the wire as a guide, dilation with an 10-19-08 mm pyloric balloon dilator was performed under fluoroscopic guidance. The dilation site was examined following endoscope reinsertion and showed mild improvement in luminal narrowing. We were unable to pass the adult scope , we subsequently further dilated the stricture with a CRE dilator serially from 10-12 mm and subsequently the adult scope was able to pass through. The stricture still appeared to be very tight and the scope was not able to easily glide past. Biopsies were taken with a cold forceps for histology.     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  The size of the tumor would suggest T3 lesion, likely clinical stage III disease.  I think he should proceed with neoadjuvant concurrent chemoradiation.  With good response to radiation and chemotherapy the patient could be considered for surgical resection.  Although 76 years old he has no major medical  problems and appears fit.    It appears the patient at least currently is unable to take much diet p.o., I would recommend placement of laparoscopic jejunostomy tube and transition him to enteral nutrition instead of TNA.  If general surgeons [place] do not wish to proceed with this, I can arrange with general surgery in [facility] to place a jejunostomy feeding tube.   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  We can consider EUS at this point however with 2 previous endoscopies and the difficulty in passing the scope completely into the stomach it is unlikely to fall exam can be completed at this time.  We could consider this at the time of restaging before a final decision about surgery is made.  The patient was given written information concerning for esophageal carcinoma and esophagectomy.  I will plan to see him back in 1 month to evaluate his progress.  I have discussed with the patient and his family that if we proceeded with surgical resection after concomitant he had neoadjuvant chemoradiation typically this would be done at least 6-8 weeks at the completion of therapy, and after restaging evaluation.   I  spent 40 minutes counseling the patient face to face and 50% or more the  time was spent in counseling and coordination of care. The total time spent in the appointment was 60 minutes.  Grace Isaac MD      [address] [place] [address] Office [phone]   [place]  01/03/2017 4:45 PM

## 2017-01-04 ENCOUNTER — Inpatient Hospital Stay (HOSPITAL_BASED_OUTPATIENT_CLINIC_OR_DEPARTMENT_OTHER): Payer: Medicare Other | Admitting: Oncology

## 2017-01-04 ENCOUNTER — Encounter: Payer: Self-pay | Admitting: Oncology

## 2017-01-04 ENCOUNTER — Inpatient Hospital Stay: Payer: Medicare Other

## 2017-01-04 ENCOUNTER — Telehealth: Payer: Self-pay | Admitting: Cardiothoracic Surgery

## 2017-01-04 ENCOUNTER — Ambulatory Visit
Admission: RE | Admit: 2017-01-04 | Discharge: 2017-01-04 | Disposition: A | Discharge status: Home / Self Care | Payer: Medicare Other | Source: Ambulatory Visit | Attending: Radiation Oncology | Admitting: Radiation Oncology

## 2017-01-04 ENCOUNTER — Other Ambulatory Visit: Payer: Self-pay | Admitting: *Deleted

## 2017-01-04 VITALS — HR 90 | Temp 97.0°F | Resp 14 | Wt 180.0 lb

## 2017-01-04 DIAGNOSIS — C155 Malignant neoplasm of lower third of esophagus: Secondary | ICD-10-CM

## 2017-01-04 DIAGNOSIS — K219 Gastro-esophageal reflux disease without esophagitis: Secondary | ICD-10-CM | POA: Insufficient documentation

## 2017-01-04 DIAGNOSIS — Z510 Encounter for antineoplastic radiation therapy: Secondary | ICD-10-CM | POA: Insufficient documentation

## 2017-01-04 DIAGNOSIS — Z7189 Other specified counseling: Secondary | ICD-10-CM

## 2017-01-04 DIAGNOSIS — Z9049 Acquired absence of other specified parts of digestive tract: Secondary | ICD-10-CM | POA: Insufficient documentation

## 2017-01-04 DIAGNOSIS — Z01810 Encounter for preprocedural cardiovascular examination: Secondary | ICD-10-CM | POA: Insufficient documentation

## 2017-01-04 DIAGNOSIS — C159 Malignant neoplasm of esophagus, unspecified: Secondary | ICD-10-CM

## 2017-01-04 MED ORDER — ONDANSETRON 4 MG PO TBDP: 4.0000 mg | ORAL_TABLET | Freq: Three times a day (TID) | ORAL | 0 refills | Status: DC | PRN

## 2017-01-04 NOTE — Progress Notes (Signed)
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  Past medical history of lap cholecystectomy, dysphagia, GERD.  Planning J-tube placement and then chemotherapy and radiation therapy.  Possible esophagectomy following treatment.  Met with patient, wife and daughter this pm.  Patient reports decreased oral intake for the past few months due to dysphagia. Currently only able to take tiny bit of popscile, next to nothing orally.  Patient is currently on TPN provided by [facility]. Patient does not know TPN prescription.  Reports that TPN runs 24 hours per day. Patient reports nausea today.   TPN prescription per [facility]: 2062m over 24 hours, dext 300 gm, Freamine 110 gm, nutrilipid 60 gm, Na 81 meq, K 62 meq, Phos 26 mm, Ca 10 meq, Mag 10 meq  Nutrition Focused Physical Exam: deferred  Medications: none listed in chart  Labs: reviewed  Anthropometrics:   Height: 69 inches Weight: 180 lb UBW: 185-187 lb per patient reports about 2 months ago BMI: 26  3% weight loss in the last 2 months   Estimated Energy Needs  Kcals: 2050-2460 calories/d Protein: 102-123 g/d Fluid: 2.4 L/d  NUTRITION DIAGNOSIS: Inadequate oral intake related to esophageal cancer/stricture as evidenced by 3% weight loss, inability to take nutrition orally and on TPN   MALNUTRITION DIAGNOSIS: continue to monitor   INTERVENTION:   Agree with placement of J-tube and tapering off TPN to improve nutrition. Discussed briefly with patient continuous pump feeding, ideally would like to be on shorter 12 hour feeding vs 24 hour rate.   Will await plans for J-tube feeding and provide recommendations at that time. KSteffanie Dunn RN Navigator will let RD know when tube will be placed.  Contact information given to patient    MONITORING, EVALUATION, GOAL: weight trends, tube feeding tolerance,  labs   NEXT VISIT: to be determined   Barrie Wale B. AZenia [place] Valley[place]istered Dietitian [phone](pager)

## 2017-01-04 NOTE — Consult Note (Signed)
NEW PATIENT EVALUATION  Name: Casey Acevedo  MRN: [id number]  Date:   01/04/2017     DOB: 1940-05-14   This 76 y.o. male patient presents to the clinic for initial evaluation of distal esophageal squamous cell carcinoma. For preoperative neoadjuvant chemoradiation  REFERRING PHYSICIAN: Idelle Crouch, MD  CHIEF COMPLAINT: No chief complaint on file.   DIAGNOSIS: There were no encounter diagnoses.   PREVIOUS INVESTIGATIONS:  CT scans and PET/CT scan reviewed Pathology reviewed Clinical notes reviewed Case presented at weekly tumor conference  HPI: Patient is a 76 year old male who has presented with approximate 10 pound weight loss over the past several months as well as dysphasia progressing to inability to eat solid food. His nutrition at this point is from TPN He was seen back in September for evaluation and underwent an upper endoscopy with dilatation. He did have some significant chest pain at the time imaging of his esophagus showed abrupt obstruction of the distal esophagus just above the GE junction. CT scan confirms soft tissue fullness in the distal esophagus with isolated gastrohepatic ligament adenopathy suspicious for metastatic disease. PET CT scan also confirmed hypermetabolic activity in the distal esophagus with associated esophageal dilatation no hypermetabolic adenopathy or distant metastatic disease was noted. Biopsy of his distal esophagus was positive for squamous cell carcinoma. Patient has been seen by thoracic surgeon in [facility] Dr. Pia Mau. Based on the complete obstruction he did not think endoscopic ultrasound was possible secondary to the significant stricture. Based on the tumor size and evaluation he thought this to be a T3 lesion. He is scheduled to have a jejunostomy tube placed. Plan is to proceed with concurrent chemoradiation in a neoadjuvant setting prior to surgical resection. He is seen today for evaluation. Continues to have significant  dysphagia. He is having good bowel movements at this time. He's having no other evidence of pain.  PLANNED TREATMENT REGIMEN: Concurrent chemoradiation in a neoadjuvant fashion  PAST MEDICAL HISTORY:  has a past medical history of Dysphagia and GERD (gastroesophageal reflux disease).    PAST SURGICAL HISTORY:  Past Surgical History:  Procedure Laterality Date  . CHOLECYSTECTOMY    . ESOPHAGOGASTRODUODENOSCOPY (EGD) WITH PROPOFOL N/A 12/07/2016   Procedure: ESOPHAGOGASTRODUODENOSCOPY (EGD) WITH PROPOFOL;  Surgeon: Jonathon Bellows, MD;  Location: [facility] ENDOSCOPY;  Service: Gastroenterology;  Laterality: N/A;  . ESOPHAGOGASTRODUODENOSCOPY (EGD) WITH PROPOFOL N/A 12/26/2016   Procedure: ESOPHAGOGASTRODUODENOSCOPY (EGD) WITH PROPOFOL WITH DILATION;  Surgeon: Jonathon Bellows, MD;  Location: [facility] ENDOSCOPY;  Service: Gastroenterology;  Laterality: N/A;  . EYE SURGERY      FAMILY HISTORY: family history includes Heart failure in his father.  SOCIAL HISTORY:  reports that he has never smoked. He has never used smokeless tobacco. He reports that he does not drink alcohol or use drugs.  ALLERGIES: Patient has no known allergies.  MEDICATIONS:  No current outpatient prescriptions on file.   No current facility-administered medications for this encounter.     ECOG PERFORMANCE STATUS:  1 - Symptomatic but completely ambulatory  REVIEW OF SYSTEMS: Except for the dysphasia and he is in excellent health Patient denies any weight loss, fatigue, weakness, fever, chills or night sweats. Patient denies any loss of vision, blurred vision. Patient denies any ringing  of the ears or hearing loss. No irregular heartbeat. Patient denies heart murmur or history of fainting. Patient denies any chest pain or pain radiating to her upper extremities. Patient denies any shortness of breath, difficulty breathing at night, cough or hemoptysis. Patient denies any swelling  in the lower legs. Patient denies any nausea vomiting,  vomiting of blood, or coffee ground material in the vomitus. Patient denies any stomach pain. Patient states has had normal bowel movements no significant constipation or diarrhea. Patient denies any dysuria, hematuria or significant nocturia. Patient denies any problems walking, swelling in the joints or loss of balance. Patient denies any skin changes, loss of hair or loss of weight. Patient denies any excessive worrying or anxiety or significant depression. Patient denies any problems with insomnia. Patient denies excessive thirst, polyuria, polydipsia. Patient denies any swollen glands, patient denies easy bruising or easy bleeding. Patient denies any recent infections, allergies or URI. Patient "s visual fields have not changed significantly in recent time.    PHYSICAL EXAM: There were no vitals taken for this visit. Well-developed well-nourished patient in NAD. HEENT reveals PERLA, EOMI, discs not visualized.  Oral cavity is clear. No oral mucosal lesions are identified. Neck is clear without evidence of cervical or supraclavicular adenopathy. Lungs are clear to A&P. Cardiac examination is essentially unremarkable with regular rate and rhythm without murmur rub or thrill. Abdomen is benign with no organomegaly or masses noted. Motor sensory and DTR levels are equal and symmetric in the upper and lower extremities. Cranial nerves II through XII are grossly intact. Proprioception is intact. No peripheral adenopathy or edema is identified. No motor or sensory levels are noted. Crude visual fields are within normal range.  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 Would  plan on using I MRT radiation therapy to deliver 5400 cGy in 28 fractions to his area of involvement. Risks and benefits of treatment including possible increased dysphasia fatigue alteration of blood counts skin reaction all were described in detail to the patient and his family. I have personally set up and ordered CT simulation for early next week.There will be extra effort by both professional staff as well as technical staff to coordinate and manage concurrent chemoradiation and ensuing side effects during his treatments. Patient will have a jejunostomy tube placed to increase his nutritional support.  I would like to take this opportunity to thank you for allowing me to participate in the care of your patient.Armstead Peaks., MD

## 2017-01-04 NOTE — Progress Notes (Signed)
Hematology/Oncology Consult note [facility]  Telephone:([phone] [phone]  Patient Care Team: Idelle Crouch, MD as PCP - General (Internal Medicine) Clent Jacks, RN as Registered Nurse Grace Isaac, MD as Consulting Physician (Cardiothoracic Surgery) Sindy Guadeloupe, MD as Consulting Physician (Oncology) Noreene Filbert, MD as Referring Physician (Radiation Oncology)   Name of the patient: Casey Acevedo  [id number]  [birth year]   Date of visit: 01/04/17  Diagnosis- non metastatic esophageal cancer [id number] [facility]  Chief complaint/ Reason for visit- discuss pet/ct results and further management  Heme/Onc history: 1. Patient is a 76 year old gentleman with no significant past medical history and takes no medications he has a remote history of smokingduring his college days and quit smoking thereafter. No history of any alcohol intake. He was recently admitted to the hospital on 12/06/2016 with symptoms of progressive dysphagia which he states has been going on since the starting of September. His dysphagia got to the point that he began to have pain even on swallowing icecream. He underwent EGD at that time which showed but high 2 cm long stricture 2 cm above the GE junction. Biopsies at that time were negative for malignancy. Given that he was unable to eat he was started on TPN at that time and plan was to get a repeat EGD with possible biopsy  2. Patient underwent repeat EGD on 12/26/2016 where he was again noted to have a lower esophageal stricture which reduced the lumen to less than 3-4 mm. There was no evidence of Barrett's esophagus. 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 Shortly after the stricture was dilated the stricture did close up significantly. Postprocedure patient complained of abdominal pain and there was a concern for  perforation and hence a repeat CT abdomen was obtained which did not show any evidence of perforation  3. CT abdomen pelvis as well as CT chest with contrast on 10/01/2018showed: 7 mm sub pleural nodule in the lingula. No evidence of axillary mediastinal or hilar adenopathy.1.3 cm soft tissue nodule adjacent to the GE junctionwhich is favored to represent an enlarged lymph node potentially active or metastatic in etiology. Narrowing involving thickening of the distal esophagus compatible with known distal esophageal stricture  4. Patient has been referred to [place] for further management. He is tolerating his TPN well and otherwise reports no complaints. He lives with his wife and is independent of his ADLs and IADLs. Besides dysphagia patient reports no loss of appetite or unintentional weight loss over the last few months. He denies any pain  5. Patient seen by Dr. Servando Snare from cardiothoracic surgery from [place]. He has been deemed to be a potential surgical candidate in the future. Plan for now is to proceed with concurrent chemoradiation followed by EUS upon completion of treatment given difficulty with the   second EGD  Interval history- doing well since last visit. Denies any complaints. Reports occasional nausea  ECOG PS- 0 Pain scale- 0 Opioid associated constipation- no  Review of systems- Review of Systems  Constitutional: Negative for chills, fever, malaise/fatigue and weight loss.  HENT: Negative for congestion, ear discharge and nosebleeds.   Eyes: Negative for blurred vision.  Respiratory: Negative for cough, hemoptysis, sputum production, shortness of breath and wheezing.   Cardiovascular: Negative for chest pain, palpitations, orthopnea and claudication.  Gastrointestinal: Positive for nausea. Negative for abdominal pain, blood in  stool, constipation, diarrhea, heartburn, melena and vomiting.  Genitourinary: Negative for dysuria, flank pain, frequency, hematuria and urgency.    Musculoskeletal: Negative for back pain, joint pain and myalgias.  Skin: Negative for rash.  Neurological: Negative for dizziness, tingling, focal weakness, seizures, weakness and headaches.  Endo/Heme/Allergies: Does not bruise/bleed easily.  Psychiatric/Behavioral: Negative for depression and suicidal ideas. The patient does not have insomnia.        No Known Allergies   Past Medical History:  Diagnosis Date  . Dysphagia   . GERD (gastroesophageal reflux disease)      Past Surgical History:  Procedure Laterality Date  . CHOLECYSTECTOMY    . ESOPHAGOGASTRODUODENOSCOPY (EGD) WITH PROPOFOL N/A 12/07/2016   Procedure: ESOPHAGOGASTRODUODENOSCOPY (EGD) WITH PROPOFOL;  Surgeon: Jonathon Bellows, MD;  Location: [facility] ENDOSCOPY;  Service: Gastroenterology;  Laterality: N/A;  . ESOPHAGOGASTRODUODENOSCOPY (EGD) WITH PROPOFOL N/A 12/26/2016   Procedure: ESOPHAGOGASTRODUODENOSCOPY (EGD) WITH PROPOFOL WITH DILATION;  Surgeon: Jonathon Bellows, MD;  [facility] ENDOSCOPY;  Service: Gastroenterology;  Laterality: N/A;  . EYE SURGERY      Social History   Social History  . Marital status: Married    Spouse name: N/A  . Number of children: N/A  . Years of education: N/A   Occupational History  . Not on file.   Social History Main Topics  . Smoking status: Never Smoker  . Smokeless tobacco: Never Used  . Alcohol use No  . Drug use: No  . Sexual activity: Not Currently   Other Topics Concern  . Not on file   Social History Narrative   Independent at baseline. Ambulatory    Family History  Problem Relation Age of Onset  . Heart failure Father   . Prostate cancer Neg Hx   . Bladder Cancer Neg Hx   . Kidney cancer Neg Hx     No current outpatient prescriptions on file.  Physical exam:  Vitals:   01/04/17 1335  Pulse: 90  Resp: 14  Temp: (!) 97 F (36.1 C)  TempSrc: Tympanic  Weight: 180 lb (81.6 kg)   Physical Exam  Constitutional: He is oriented to person, place, and  time and well-developed, well-nourished, and in no distress.  HENT:  Head: Normocephalic and atraumatic.  Eyes: Pupils are equal, round, and reactive to light. EOM are normal.  Neck: Normal range of motion.  Cardiovascular: Normal rate, regular rhythm and normal heart sounds.   Pulmonary/Chest: Effort normal and breath sounds normal.  Abdominal: Soft. Bowel sounds are normal.  Musculoskeletal:  Right arm picc line in place for tpn  Neurological: He is alert and oriented to person, place, and time.  Skin: Skin is warm and dry.     CMP Latest Ref Rng & Units 12/12/2016  Glucose 65 - 99 mg/dL 138(H)  BUN 6 - 20 mg/dL 29(H)  Creatinine 0.61 - 1.24 mg/dL 0.97  Sodium 135 - 145 mmol/L 137  Potassium 3.5 - 5.1 mmol/L 3.7  Chloride 101 - 111 mmol/L 108  CO2 22 - 32 mmol/L 23  Calcium 8.9 - 10.3 mg/dL 8.7(L)  Total Protein 6.5 - 8.1 g/dL 6.1(L)  Total Bilirubin 0.3 - 1.2 mg/dL 0.6  Alkaline Phos 38 - 126 U/L 53  AST 15 - 41 U/L 11(L)  ALT 17 - 63 U/L 14(L)   CBC Latest Ref Rng & Units 12/11/2016  WBC 3.8 - 10.6 K/uL 7.1  Hemoglobin 13.0 - 18.0 g/dL 14.6  Hematocrit 40.0 - 52.0 % 42.4  Platelets 150 - 440 K/uL 175  No images are attached to the encounter.  Ct Abdomen Wo Contrast  Result Date: 12/26/2016 CLINICAL DATA:  Esophageal cancer. Gastroesophageal reflux disease. Dysphagia. Endoscopy from earlier today demonstrating esophageal stenosis at approximately 40 cm from the incisors. Rule out perforation of esophagus. EXAM: CT CHEST, ABDOMEN WITH CONTRAST TECHNIQUE: Multidetector CT imaging of the chest, abdomen was performed following the standard protocol during bolus administration of intravenous contrast. CONTRAST:  75 cc of Isovue-300 COMPARISON:  Esophagram of earlier today.  Prior CTs of 12/11/2016. FINDINGS: CT CHEST FINDINGS Cardiovascular: Aortic and branch vessel atherosclerosis. Normal heart size, without pericardial effusion. Multivessel coronary artery atherosclerosis. A  right-sided PICC line terminates at the high right atrium. Mediastinum/Nodes: No supraclavicular adenopathy. No mediastinal or definite hilar adenopathy, given limitations of unenhanced CT. Soft tissue fullness at the distal esophagus including on image 50/series 2. This is similar. The more proximal esophagus is dilated and contrast filled from today's esophagram. No extraluminal contrast identified. No pneumomediastinum. Lungs/Pleura: No pleural fluid. Right upper lobe subpleural calcified granuloma on image 65/series 4. 5 mm lingular nodule on image 97/series 4 is is similar to on the prior. Minimal subpleural left lower lobe nodularity at 3 mm on image 127/series 4. Not readily apparent on the prior, favored to represent a subpleural lymph node. Musculoskeletal: No acute osseous abnormality. CT ABDOMEN  FINDINGS Hepatobiliary: High left hepatic lobe subcentimeter low-density lesion is likely a cyst. Cholecystectomy, without biliary ductal dilatation. Pancreas: Pancreatic atrophy, without duct dilatation or dominant mass. Spleen: Old granulomatous disease in the spleen. Adrenals/Urinary Tract: Normal adrenal glands. No renal calculi or hydronephrosis. Stomach/Bowel: Normal appearance of the stomach. Normal abdominal bowel loops. Vascular/Lymphatic: Aortic and branch vessel atherosclerosis. Gastrohepatic ligament adenopathy is again identified, including at 1.3 cm on image 53/series 2. Other:  No ascites.  No abdominal peritoneal or omental metastasis. Musculoskeletal: No acute osseous abnormality. IMPRESSION: 1. Soft tissue fullness in the distal esophagus, likely representing the primary site of malignancy. This is at least partially obstructive, with contrast from today's esophagram identified within a dilated more superior esophagus. 2. Isolated gastrohepatic ligament adenopathy, highly suspicious for metastatic disease. 3. No evidence of esophageal perforation. 4. Nonspecific lingular nodule, similar. 5.  Coronary artery atherosclerosis. Aortic Atherosclerosis (ICD10-I70.0). Electronically Signed   By: Abigail Miyamoto M.D.   On: 12/26/2016 14:58   Ct Chest Wo Contrast  Result Date: 12/26/2016 CLINICAL DATA:  Esophageal cancer. Gastroesophageal reflux disease. Dysphagia. Endoscopy from earlier today demonstrating esophageal stenosis at approximately 40 cm from the incisors. Rule out perforation of esophagus. EXAM: CT CHEST, ABDOMEN WITH CONTRAST TECHNIQUE: Multidetector CT imaging of the chest, abdomen was performed following the standard protocol during bolus administration of intravenous contrast. CONTRAST:  75 cc of Isovue-300 COMPARISON:  Esophagram of earlier today.  Prior CTs of 12/11/2016. FINDINGS: CT CHEST FINDINGS Cardiovascular: Aortic and branch vessel atherosclerosis. Normal heart size, without pericardial effusion. Multivessel coronary artery atherosclerosis. A right-sided PICC line terminates at the high right atrium. Mediastinum/Nodes: No supraclavicular adenopathy. No mediastinal or definite hilar adenopathy, given limitations of unenhanced CT. Soft tissue fullness at the distal esophagus including on image 50/series 2. This is similar. The more proximal esophagus is dilated and contrast filled from today's esophagram. No extraluminal contrast identified. No pneumomediastinum. Lungs/Pleura: No pleural fluid. Right upper lobe subpleural calcified granuloma on image 65/series 4. 5 mm lingular nodule on image 97/series 4 is is similar to on the prior. Minimal subpleural left lower lobe nodularity at 3 mm on image 127/series 4. Not readily apparent  on the prior, favored to represent a subpleural lymph node. Musculoskeletal: No acute osseous abnormality. CT ABDOMEN  FINDINGS Hepatobiliary: High left hepatic lobe subcentimeter low-density lesion is likely a cyst. Cholecystectomy, without biliary ductal dilatation. Pancreas: Pancreatic atrophy, without duct dilatation or dominant mass. Spleen: Old  granulomatous disease in the spleen. Adrenals/Urinary Tract: Normal adrenal glands. No renal calculi or hydronephrosis. Stomach/Bowel: Normal appearance of the stomach. Normal abdominal bowel loops. Vascular/Lymphatic: Aortic and branch vessel atherosclerosis. Gastrohepatic ligament adenopathy is again identified, including at 1.3 cm on image 53/series 2. Other:  No ascites.  No abdominal peritoneal or omental metastasis. Musculoskeletal: No acute osseous abnormality. IMPRESSION: 1. Soft tissue fullness in the distal esophagus, likely representing the primary site of malignancy. This is at least partially obstructive, with contrast from today's esophagram identified within a dilated more superior esophagus. 2. Isolated gastrohepatic ligament adenopathy, highly suspicious for metastatic disease. 3. No evidence of esophageal perforation. 4. Nonspecific lingular nodule, similar. 5. Coronary artery atherosclerosis. Aortic Atherosclerosis (ICD10-I70.0). Electronically Signed   By: Abigail Miyamoto M.D.   On: 12/26/2016 14:58   Ct Chest W Contrast  Result Date: 12/11/2016 CLINICAL DATA:  Patient with progressive difficulty swallowing. EXAM: CT CHEST, ABDOMEN, AND PELVIS WITH CONTRAST TECHNIQUE: Multidetector CT imaging of the chest, abdomen and pelvis was performed following the standard protocol during bolus administration of intravenous contrast. CONTRAST:  195m ISOVUE-300 IOPAMIDOL (ISOVUE-300) INJECTION 61% COMPARISON:  CT abdomen pelvis 05/11/2006 FINDINGS: CT CHEST FINDINGS Cardiovascular: Normal heart size. Coronary arterial vascular calcifications. Thoracic aortic vascular calcifications. Right upper extremity PICC line tip terminates in the superior vena cava. Mediastinum/Nodes: No enlarged axillary, mediastinal or hilar lymphadenopathy. There is wall thickening and narrowing of the distal esophagus (image 50; series 2). Lungs/Pleura: There is a 7 mm subpleural nodule in the lingula (image 96; series 4).  Calcified granuloma within the medial right upper lobe (image 63; series 4). Dependent atelectasis/ scarring within the lower lobes bilaterally. No pleural effusion or pneumothorax. Musculoskeletal: Thoracic spine degenerative changes. No aggressive or acute appearing osseous lesions. CT ABDOMEN PELVIS FINDINGS Hepatobiliary: The liver is normal in size and contour. Subcentimeter too small to characterize low-attenuation lesion left hepatic lobe (image 50 ; series 2). Fatty deposition adjacent to the falciform ligament. Status post cholecystectomy. No intrahepatic or extrahepatic biliary ductal dilatation. Pancreas: Mildly atrophic. Spleen: Calcified granulomas in the spleen. Adrenals/Urinary Tract: The adrenal glands are normal. Kidneys enhance symmetrically with contrast. No hydronephrosis. Urinary bladder is unremarkable. Stomach/Bowel: Colon is decompressed, limiting evaluation. Suggestion of mild colonic wall thickening. Normal morphology of the stomach. There is a 1.3 cm soft tissue nodule adjacent to the GE junction (image 53; series 2). Vascular/Lymphatic: Normal caliber abdominal aorta. Peripheral calcified atherosclerotic plaque. No retroperitoneal lymphadenopathy. Reproductive: Prostate unremarkable. Other: Bilateral fat containing inguinal hernias. Musculoskeletal: Lumbar spine degenerative changes. No aggressive or acute appearing osseous lesions. IMPRESSION: There is a 1.3 cm soft tissue nodule adjacent to the GE junction. This is favored to represent an enlarged lymph node, potentially reactive or metastatic in etiology. The colon is decompressed however there is suggestion of wall thickening, raising the possibility of colitis. There is a 7 mm nodule within the left upper lobe. Non-contrast chest CT at 6-12 months is recommended. If the nodule is stable at time of repeat CT, then future CT at 18-24 months (from today's scan) is considered optional for low-risk patients, but is recommended for  high-risk patients. This recommendation follows the consensus statement: Guidelines for Management of Incidental Pulmonary Nodules Detected on  CT Images: From the Fleischner Society 2017; Radiology 2017; [id number]. Narrowing and wall thickening of the distal esophagus, compatible with known distal esophageal stricture. Electronically Signed   By: Lovey Newcomer M.D.   On: 12/11/2016 20:02   Ct Abdomen Pelvis W Contrast  Result Date: 12/11/2016 CLINICAL DATA:  Patient with progressive difficulty swallowing. EXAM: CT CHEST, ABDOMEN, AND PELVIS WITH CONTRAST TECHNIQUE: Multidetector CT imaging of the chest, abdomen and pelvis was performed following the standard protocol during bolus administration of intravenous contrast. CONTRAST:  156m ISOVUE-300 IOPAMIDOL (ISOVUE-300) INJECTION 61% COMPARISON:  CT abdomen pelvis 05/11/2006 FINDINGS: CT CHEST FINDINGS Cardiovascular: Normal heart size. Coronary arterial vascular calcifications. Thoracic aortic vascular calcifications. Right upper extremity PICC line tip terminates in the superior vena cava. Mediastinum/Nodes: No enlarged axillary, mediastinal or hilar lymphadenopathy. There is wall thickening and narrowing of the distal esophagus (image 50; series 2). Lungs/Pleura: There is a 7 mm subpleural nodule in the lingula (image 96; series 4). Calcified granuloma within the medial right upper lobe (image 63; series 4). Dependent atelectasis/ scarring within the lower lobes bilaterally. No pleural effusion or pneumothorax. Musculoskeletal: Thoracic spine degenerative changes. No aggressive or acute appearing osseous lesions. CT ABDOMEN PELVIS FINDINGS Hepatobiliary: The liver is normal in size and contour. Subcentimeter too small to characterize low-attenuation lesion left hepatic lobe (image 50 ; series 2). Fatty deposition adjacent to the falciform ligament. Status post cholecystectomy. No intrahepatic or extrahepatic biliary ductal dilatation. Pancreas: Mildly  atrophic. Spleen: Calcified granulomas in the spleen. Adrenals/Urinary Tract: The adrenal glands are normal. Kidneys enhance symmetrically with contrast. No hydronephrosis. Urinary bladder is unremarkable. Stomach/Bowel: Colon is decompressed, limiting evaluation. Suggestion of mild colonic wall thickening. Normal morphology of the stomach. There is a 1.3 cm soft tissue nodule adjacent to the GE junction (image 53; series 2). Vascular/Lymphatic: Normal caliber abdominal aorta. Peripheral calcified atherosclerotic plaque. No retroperitoneal lymphadenopathy. Reproductive: Prostate unremarkable. Other: Bilateral fat containing inguinal hernias. Musculoskeletal: Lumbar spine degenerative changes. No aggressive or acute appearing osseous lesions. IMPRESSION: There is a 1.3 cm soft tissue nodule adjacent to the GE junction. This is favored to represent an enlarged lymph node, potentially reactive or metastatic in etiology. The colon is decompressed however there is suggestion of wall thickening, raising the possibility of colitis. There is a 7 mm nodule within the left upper lobe. Non-contrast chest CT at 6-12 months is recommended. If the nodule is stable at time of repeat CT, then future CT at 18-24 months (from today's scan) is considered optional for low-risk patients, but is recommended for high-risk patients. This recommendation follows the consensus statement: Guidelines for Management of Incidental Pulmonary Nodules Detected on CT Images: From the Fleischner Society 2017; Radiology 2017; 284:228-243. Narrowing and wall thickening of the distal esophagus, compatible with known distal esophageal stricture. Electronically Signed   By: DLovey NewcomerM.D.   On: 12/11/2016 20:02   Dg Esophagus  Result Date: 12/06/2016 CLINICAL DATA:  Difficulty swallowing, constant vomiting EXAM: ESOPHOGRAM/BARIUM SWALLOW TECHNIQUE: Single contrast examination was performed using  thick barium. FLUOROSCOPY TIME:  Fluoroscopy Time:   0.5 minute Radiation Exposure Index (if provided by the fluoroscopic device): 5.7 mGy Number of Acquired Spot Images: 0 COMPARISON:  None. FINDINGS: There was normal pharyngeal anatomy and motility. Patient is extremely nauseous. Mild esophageal dilatation with a high-grade stricture of the distal esophagus with irregular margins. IMPRESSION: 1. 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 Recommend upper  endoscopy. Electronically Signed   By: Kathreen Devoid   On: 12/06/2016 11:53   Nm Pet Image Initial (pi) Skull Base To Thigh  Result Date: 01/03/2017 CLINICAL DATA:  Initial treatment strategy for esophageal cancer. EXAM: NUCLEAR MEDICINE PET SKULL BASE TO THIGH TECHNIQUE: Twelve point for mCi F-18 FDG was injected intravenously. Full-ring PET imaging was performed from the skull base to thigh after the radiotracer. CT data was obtained and used for attenuation correction and anatomic localization. FASTING BLOOD GLUCOSE:  Value: 127 mg/dl COMPARISON:  CT chest abdomen pelvis 12/26/2016. FINDINGS: NECK: No hypermetabolic lymph nodes in the neck. CT images show no acute findings. CHEST: No hypermetabolic mediastinal, hilar or axillary lymph nodes. Distal esophageal mass measures approximately 2.5 x 2.7 cm with an SUV max of 8.0. No adjacent hypermetabolic adenopathy. No hypermetabolic pulmonary nodules. Atherosclerotic calcification of the arterial vasculature, including coronary arteries. Right PICC tip terminates at the SVC RA junction. Heart is mildly enlarged. No pericardial or pleural effusion. Esophagus is dilated throughout its course, to the level of the above-described esophageal mass. ABDOMEN/PELVIS: No abnormal hypermetabolism in the liver, adrenal glands, spleen or pancreas. No hypermetabolic lymph nodes. Subcentimeter low-attenuation lesion in the dome of the liver is too small to  characterize. Cholecystectomy. Adrenal glands, kidneys, spleen, pancreas, stomach and bowel are grossly unremarkable. Atherosclerotic calcification of the arterial vasculature without aneurysm. Prostate is mildly enlarged. No free fluid. SKELETON: No abnormal osseous hypermetabolism. IMPRESSION: 1. Hypermetabolic distal esophageal mass with associated esophageal dilatation. No hypermetabolic adenopathy or distant metastatic disease. 2. Aortic atherosclerosis (ICD10-170.0). Coronary artery calcification. Electronically Signed   By: Lorin Picket M.D.   On: 01/03/2017 14:44   Dg C-arm 1-60 Min-no Report  Result Date: 12/26/2016 Fluoroscopy was utilized by the requesting physician.  No radiographic interpretation.   Dg Esophagus W/water Sol Cm  Result Date: 12/26/2016 CLINICAL DATA:  Recent esophageal instrumentation. Pain. Evaluate for esophageal rupture. EXAM: ESOPHOGRAM/BARIUM SWALLOW TECHNIQUE: Single contrast examination was performed using water-soluble contrast. FLUOROSCOPY TIME:  1 minutes and 6 seconds COMPARISON:  None. FINDINGS: The patient ingested water-soluble contrast and imaging was obtained. There is an obstruction of the distal esophagus, just above the gastroesophageal junction. A fluid level exists in the esophagus. Contrast never traversed beyond the distal esophagus. The patient vomited. There is no evidence of extravasation to suggest leak or rupture. IMPRESSION: No evidence of contrast extravasation to suggest esophageal injury or perforation There is abrupt obstruction of the distal esophagus just above the gastroesophageal junction. Electronically Signed   By: Marybelle Killings M.D.   On: 12/26/2016 14:46     Assessment and plan- Patient is a 76 y.o. male non metastatic esophageal cancer [id number] SCC  Discuss results of the PET CT scan does not show any evidence of metastatic disease. We do not have an accurate assessment of the depth of the lesion given that he is not possible at  this time given significant esophageal stricture. He may be a potential surgical candidate in the future despite his age because he does not have any significant comorbidities  Plan for now is to proceed with neoadjuvant chemoradiation. I plan to give him weekly carboplatin AUC 2 IV along with Taxol at 50 mg/m IV weekly along with radiation per CROSS trial. . Treatment will be given with curative intent. Discussed risks and benefits of chemotherapy including all but not limited to nausea, vomiting, fatigue, low blood counts, risk of infections. Risk of peripheral neuropathy and infusion reaction associated with Taxol. Patient understands  and agrees to proceed as planned. We will plan for port placement in anticipation of starting treatment  Patient needs a jejunostomy tube in place and that he can be transitioned off the TPN before we can start treatment that way he will also be able to get when necessary nausea medications and other normal medications on an as-needed basis that he can administered through the J-tube. For now I will prescribe as needed Zofran ODT for his nausea  I will tentatively see him in 10 days' time after port placement and G-tube placement to start his first cycle of chemotherapy. He will also be going through chemotherapy class at this time. Patient has already met with Dr. Baruch Gouty who plans to give him 28 days of daily RT 5400cGy   Total face to face encounter time for this patient visit was 45 min. >50% of the time was  spent in counseling and coordination of care. '    Visit Diagnosis 1. Malignant neoplasm of lower third of esophagus (HCC)   2. 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 Goals of care, counseling/discussion      Dr. Randa Evens, MD, MPH [facility] at [facility] Pager- [phone] 01/04/2017 3:30 PM

## 2017-01-04 NOTE — Patient Instructions (Signed)

## 2017-01-04 NOTE — Progress Notes (Signed)
Patient states that he is very nauseated today and feels like he needs to throw up even though there is nothing on his stomach.

## 2017-01-05 ENCOUNTER — Other Ambulatory Visit: Payer: Self-pay | Admitting: Oncology

## 2017-01-05 DIAGNOSIS — C155 Malignant neoplasm of lower third of esophagus: Secondary | ICD-10-CM

## 2017-01-05 MED ORDER — LIDOCAINE-PRILOCAINE 2.5-2.5 % EX CREA: TOPICAL_CREAM | CUTANEOUS | 3 refills | Status: DC

## 2017-01-05 MED ORDER — PROCHLORPERAZINE MALEATE 10 MG PO TABS: 10.0000 mg | ORAL_TABLET | Freq: Four times a day (QID) | ORAL | 1 refills | Status: DC | PRN

## 2017-01-05 MED ORDER — DEXAMETHASONE 4 MG PO TABS: 8.0000 mg | ORAL_TABLET | Freq: Every day | ORAL | 1 refills | Status: DC

## 2017-01-05 MED ORDER — LORAZEPAM 0.5 MG PO TABS: 0.5000 mg | ORAL_TABLET | Freq: Four times a day (QID) | ORAL | 0 refills | Status: DC | PRN

## 2017-01-05 MED ORDER — ONDANSETRON HCL 8 MG PO TABS: 8.0000 mg | ORAL_TABLET | Freq: Two times a day (BID) | ORAL | 1 refills | Status: DC | PRN

## 2017-01-05 NOTE — Progress Notes (Signed)
Gastroesophageal - No Medical Intervention - Off Treatment.  Patient Characteristics: Esophageal & GE Junction, Squamous Cell, Preoperative or Nonsurgical Candidate (Clinical Staging) Histology: Squamous Cell Disease Classification: Esophageal Therapeutic Status: Preoperative or Nonsurgical Candidate (Clinical Staging) AJCC M Category: cM0 AJCC 8 Stage Grouping: Unknown AJCC Grade: GX AJCC Location: Lower AJCC T Category: cTX AJCC N Category: cN0

## 2017-01-08 ENCOUNTER — Other Ambulatory Visit: Payer: Self-pay | Admitting: Internal Medicine

## 2017-01-08 ENCOUNTER — Telehealth: Payer: Self-pay | Admitting: *Deleted

## 2017-01-08 ENCOUNTER — Other Ambulatory Visit: Payer: Self-pay | Admitting: *Deleted

## 2017-01-08 ENCOUNTER — Telehealth: Payer: Self-pay

## 2017-01-08 ENCOUNTER — Ambulatory Visit: Payer: Medicare Other | Admitting: Radiation Oncology

## 2017-01-08 NOTE — Telephone Encounter (Signed)
  Oncology Nurse Navigator Documentation Spoke with Casey Acevedo regarding all upcoming appointments. Educated on port a cath insertion date/time instructions. He in unable to swallow and he understands importance of remaining npo for port placement. I have been in contact with [facility] and they are in the process of scheduling J tube placement with Dr. Kaylyn Lim. They are aware chemo is to be started 11/9 with J tube needing to be placed prior to this date. Navigator Location: [facility] (01/08/17 1100)   )Navigator Encounter Type: Telephone (01/08/17 1100) Telephone: Appt Confirmation/Clarification (01/08/17 1100)                                                  Time Spent with Patient: 30 (01/08/17 1100)

## 2017-01-08 NOTE — Telephone Encounter (Signed)
Called pt to discuss the portacath insertion and asked if he has a date to put j tube in and he states it will be 11/6 at 8 am in [place] at [facility].  I told him port was 10/31 arrive at 12:30 for 1:30 insertion and he already saw it on my chart.  I explained that he will need a driver and NPO 8 hours prior but he only gets TPN through [facility] and has not ate since Sept.  I told him that a driver must drive him home since he will get conscious sedation. He will have a driver.  He also states that the zofram odt was expensive and he can't afford all these meds and his insurance only approves long term meds not short term meds. I will call his pharmacy and try to work on cost for the drugs he needs while on chemo.

## 2017-01-08 NOTE — Progress Notes (Signed)
Please place orders in EPIC as patient is being scheduled for a pre-op appointment! Thank you! 

## 2017-01-09 ENCOUNTER — Inpatient Hospital Stay: Payer: Medicare Other

## 2017-01-09 ENCOUNTER — Other Ambulatory Visit: Payer: Self-pay | Admitting: Radiology

## 2017-01-09 ENCOUNTER — Ambulatory Visit
Admission: RE | Admit: 2017-01-09 | Discharge: 2017-01-09 | Disposition: A | Discharge status: Home / Self Care | Payer: Medicare Other | Source: Ambulatory Visit | Attending: Radiation Oncology | Admitting: Radiation Oncology

## 2017-01-09 ENCOUNTER — Other Ambulatory Visit: Payer: Self-pay | Admitting: *Deleted

## 2017-01-09 DIAGNOSIS — Z9049 Acquired absence of other specified parts of digestive tract: Secondary | ICD-10-CM | POA: Diagnosis not present

## 2017-01-09 DIAGNOSIS — K219 Gastro-esophageal reflux disease without esophagitis: Secondary | ICD-10-CM | POA: Diagnosis not present

## 2017-01-09 DIAGNOSIS — C155 Malignant neoplasm of lower third of esophagus: Secondary | ICD-10-CM | POA: Diagnosis present

## 2017-01-09 DIAGNOSIS — Z510 Encounter for antineoplastic radiation therapy: Secondary | ICD-10-CM | POA: Diagnosis not present

## 2017-01-09 MED ORDER — DEXAMETHASONE 0.5 MG/5ML PO ELIX: 8.0000 mg | ORAL_SOLUTION | Freq: Every day | ORAL | 3 refills | Status: DC

## 2017-01-09 MED ORDER — LORAZEPAM 2 MG/ML PO CONC: 0.5000 mg | Freq: Four times a day (QID) | ORAL | 3 refills | Status: DC | PRN

## 2017-01-09 MED ORDER — ONDANSETRON HCL 4 MG/5ML PO SOLN: 8.0000 mg | Freq: Two times a day (BID) | ORAL | 3 refills | Status: DC

## 2017-01-09 MED ORDER — PROCHLORPERAZINE EDISYLATE 5 MG/ML IJ SOLN: 10.0000 mg | Freq: Four times a day (QID) | INTRAMUSCULAR | 3 refills | Status: DC | PRN

## 2017-01-10 ENCOUNTER — Ambulatory Visit
Admission: RE | Admit: 2017-01-10 | Discharge: 2017-01-10 | Disposition: A | Discharge status: Home / Self Care | Payer: Medicare Other | Source: Ambulatory Visit | Attending: Oncology | Admitting: Oncology

## 2017-01-10 DIAGNOSIS — C155 Malignant neoplasm of lower third of esophagus: Secondary | ICD-10-CM | POA: Insufficient documentation

## 2017-01-10 DIAGNOSIS — K219 Gastro-esophageal reflux disease without esophagitis: Secondary | ICD-10-CM | POA: Diagnosis not present

## 2017-01-10 HISTORY — PX: IR FLUORO GUIDE PORT INSERTION RIGHT: IMG5741

## 2017-01-10 HISTORY — DX: Malignant (primary) neoplasm, unspecified: C80.1

## 2017-01-10 LAB — BASIC METABOLIC PANEL
ANION GAP: 7 (ref 5–15)
BUN: 26 mg/dL — AB (ref 6–20)
CO2: 24 mmol/L (ref 22–32)
Calcium: 9 mg/dL (ref 8.9–10.3)
Chloride: 106 mmol/L (ref 101–111)
Creatinine, Ser: 0.98 mg/dL (ref 0.61–1.24)
GFR calc Af Amer: >60 mL/min (ref >60)
Glucose, Bld: 122 mg/dL — ABNORMAL HIGH (ref 65–99)
Potassium: 4 mmol/L (ref 3.5–5.1)
SODIUM: 137 mmol/L (ref 135–145)

## 2017-01-10 LAB — CBC
HEMATOCRIT: 44.5 % (ref 40.0–52.0)
Hemoglobin: 14.8 g/dL (ref 13.0–18.0)
MCH: 30.2 pg (ref 26.0–34.0)
MCHC: 33.4 g/dL (ref 32.0–36.0)
MCV: 90.5 fL (ref 80.0–100.0)
Platelets: 151 10*3/uL (ref 150–440)
RBC: 4.92 MIL/uL (ref 4.40–5.90)
RDW: 13.8 % (ref 11.5–14.5)
WBC: 7.4 10*3/uL (ref 3.8–10.6)

## 2017-01-10 LAB — PROTIME-INR
INR: 1.04
PROTHROMBIN TIME: 13.5 s (ref 11.4–15.2)

## 2017-01-10 LAB — APTT: APTT: 32 s (ref 24–36)

## 2017-01-10 MED ORDER — LIDOCAINE-EPINEPHRINE (PF) 1 %-1:200000 IJ SOLN
INTRAMUSCULAR | Status: AC
Start: 2017-01-10 — End: 2017-01-10
  Filled 2017-01-10: qty 30

## 2017-01-10 MED ORDER — FENTANYL CITRATE (PF) 100 MCG/2ML IJ SOLN
INTRAMUSCULAR | Status: AC
  Filled 2017-01-10: qty 4

## 2017-01-10 MED ORDER — CEFAZOLIN SODIUM-DEXTROSE 2-4 GM/100ML-% IV SOLN
2.0000 g | INTRAVENOUS | Status: AC
  Administered 2017-01-10: 2 g via INTRAVENOUS

## 2017-01-10 MED ORDER — MIDAZOLAM HCL 5 MG/5ML IJ SOLN
INTRAMUSCULAR | Status: AC | PRN
  Administered 2017-01-10 (×2): 1 mg via INTRAVENOUS

## 2017-01-10 MED ORDER — LIDOCAINE-EPINEPHRINE (PF) 1 %-1:200000 IJ SOLN
INTRAMUSCULAR | Status: AC
  Filled 2017-01-10: qty 30

## 2017-01-10 MED ORDER — SODIUM CHLORIDE 0.9 % IV SOLN
INTRAVENOUS | Status: DC
  Administered 2017-01-10: 13:00:00 via INTRAVENOUS

## 2017-01-10 MED ORDER — HEPARIN SOD (PORK) LOCK FLUSH 100 UNIT/ML IV SOLN
INTRAVENOUS | Status: AC
  Filled 2017-01-10: qty 5

## 2017-01-10 MED ORDER — FENTANYL CITRATE (PF) 100 MCG/2ML IJ SOLN
INTRAMUSCULAR | Status: AC | PRN
  Administered 2017-01-10: 50 ug via INTRAVENOUS

## 2017-01-10 MED ORDER — MIDAZOLAM HCL 5 MG/5ML IJ SOLN
INTRAMUSCULAR | Status: AC
  Filled 2017-01-10: qty 5

## 2017-01-10 NOTE — Procedures (Signed)
Esophageal ca  S/p RT IJ POWER PORT  Tip svcra No comp Stable EBL 0 Ready for use Full report in pacs

## 2017-01-10 NOTE — Progress Notes (Signed)
Please place orders in EPIC as patient has a pre-op appointment on 01/15/2017! Thank you!

## 2017-01-10 NOTE — Progress Notes (Signed)
Pt sitting up in bed at this time in NAD w/ wife at bedside.  Discharge instructions reviewed with pt and family who verbalize understanding

## 2017-01-10 NOTE — Consult Note (Signed)
Chief Complaint: Here for port insertion  Referring Physician(s): Rao,Archana C   History of Present Illness: Casey Acevedo is a 76 y.o. 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 Plan for RT IJ POWER PORT to begin chemo.  Mild chronic epigastric pain from the known mass o/w stable.  Past Medical History:  Diagnosis Date  . Cancer (HCC)    esophageal  . Dysphagia   . GERD (gastroesophageal reflux disease)     Past Surgical History:  Procedure Laterality Date  . CHOLECYSTECTOMY    . ESOPHAGOGASTRODUODENOSCOPY (EGD) WITH PROPOFOL N/A 12/07/2016   Procedure: ESOPHAGOGASTRODUODENOSCOPY (EGD) WITH PROPOFOL;  Surgeon: Jonathon Bellows, MD;  Location: [facility] ENDOSCOPY;  Service: Gastroenterology;  Laterality: N/A;  . ESOPHAGOGASTRODUODENOSCOPY (EGD) WITH PROPOFOL N/A 12/26/2016   Procedure: ESOPHAGOGASTRODUODENOSCOPY (EGD) WITH PROPOFOL WITH DILATION;  Surgeon: Jonathon Bellows, MD;  Location: [facility] ENDOSCOPY;  Service: Gastroenterology;  Laterality: N/A;  . EYE SURGERY    . PICC LINE INSERTION Right     Allergies: Patient has no known allergies.  Medications: Prior to Admission medications   Medication Sig Start Date End Date Taking? Authorizing Provider  ondansetron (ZOFRAN ODT) 4 MG disintegrating tablet Take 1 tablet (4 mg total) by mouth every 8 (eight) hours as needed for nausea or vomiting. 01/04/17  Yes Sindy Guadeloupe, MD  dexamethasone (DECADRON) 4 MG tablet Take 2 tablets (8 mg total) by mouth daily. Start the day after chemotherapy for 2 days. 01/05/17   Sindy Guadeloupe, MD  dexamethasone 0.5 MG/5ML elixir 80 mLs (8 mg total) by Per J Tube route daily. Start the day after chemotherapy and take for 2 days (after every chemotherapy treatment) 01/16/17 02/20/17  Sindy Guadeloupe, MD  lidocaine-prilocaine (EMLA) cream Apply to affected area once 01/05/17   Sindy Guadeloupe, MD  LORazepam (ATIVAN) 0.5 MG tablet Take 1 tablet (0.5 mg total) by mouth every 6 (six)  hours as needed (Nausea or vomiting). 01/05/17   Sindy Guadeloupe, MD  LORazepam (ATIVAN) 2 MG/ML concentrated solution Place 0.3 mLs (0.6 mg total) into feeding tube every 6 (six) hours as needed (nausea and vomiting). Nausea and vomiting 01/09/17   Sindy Guadeloupe, MD  ondansetron [facility]) 4 MG/5ML solution Place 10 mLs (8 mg total) into feeding tube 2 (two) times daily. As needed for nausea and vomiting 01/09/17   Sindy Guadeloupe, MD  ondansetron (ZOFRAN) 8 MG tablet Take 1 tablet (8 mg total) by mouth 2 (two) times daily as needed for refractory nausea / vomiting. Start on day 3 after chemo. 01/05/17   Sindy Guadeloupe, MD  prochlorperazine (COMPAZINE) 10 MG tablet Take 1 tablet (10 mg total) by mouth every 6 (six) hours as needed (Nausea or vomiting). 01/05/17   Sindy Guadeloupe, MD  prochlorperazine (COMPAZINE) 5 MG/ML injection Inject 2 mLs (10 mg total) into the vein every 6 (six) hours as needed (nausea and vomiting). 01/09/17   Sindy Guadeloupe, MD     Family History  Problem Relation Age of Onset  . Heart failure Father   . Prostate cancer Neg Hx   . Bladder Cancer Neg Hx   . Kidney cancer Neg Hx     Social History   Social History  . Marital status: Married    Spouse name: N/A  . Number of children: N/A  . Years of education: N/A   Social History Main Topics  . Smoking status: Never Smoker  . Smokeless  tobacco: Never Used  . Alcohol use No  . Drug use: No  . Sexual activity: Not Currently   Other Topics Concern  . None   Social History Narrative   Independent at baseline. Ambulatory    ECOG Status: 1 - Symptomatic but completely ambulatory  Review of Systems: A 12 point ROS discussed and pertinent positives are indicated in the HPI above.  All other systems are negative.  Review of Systems  Vital Signs: BP 123/77   Pulse 85   Temp 98.1 F (36.7 C) (Oral)   Resp 19   SpO2 98%   Physical Exam  Constitutional: He is oriented to person, place, and time. He appears  well-developed and well-nourished. No distress.  Eyes: Conjunctivae are normal. No scleral icterus.  Cardiovascular: Normal rate and regular rhythm.   No murmur heard. Pulmonary/Chest: Effort normal and breath sounds normal. No respiratory distress.  Abdominal: Soft. Bowel sounds are normal. There is no tenderness.  Musculoskeletal: Normal range of motion. He exhibits no edema.  Neurological: He is alert and oriented to person, place, and time.  Skin: Skin is warm and dry. He is not diaphoretic. There is erythema.  Psychiatric: He has a normal mood and affect.    Imaging: Ct Abdomen Wo Contrast  Result Date: 12/26/2016 CLINICAL DATA:  Esophageal cancer. Gastroesophageal reflux disease. Dysphagia. Endoscopy from earlier today demonstrating esophageal stenosis at approximately 40 cm from the incisors. Rule out perforation of esophagus. EXAM: CT CHEST, ABDOMEN WITH CONTRAST TECHNIQUE: Multidetector CT imaging of the chest, abdomen was performed following the standard protocol during bolus administration of intravenous contrast. CONTRAST:  75 cc of Isovue-300 COMPARISON:  Esophagram of earlier today.  Prior CTs of 12/11/2016. FINDINGS: CT CHEST FINDINGS Cardiovascular: Aortic and branch vessel atherosclerosis. Normal heart size, without pericardial effusion. Multivessel coronary artery atherosclerosis. A right-sided PICC line terminates at the high right atrium. Mediastinum/Nodes: No supraclavicular adenopathy. No mediastinal or definite hilar adenopathy, given limitations of unenhanced CT. Soft tissue fullness at the distal esophagus including on image 50/series 2. This is similar. The more proximal esophagus is dilated and contrast filled from today's esophagram. No extraluminal contrast identified. No pneumomediastinum. Lungs/Pleura: No pleural fluid. Right upper lobe subpleural calcified granuloma on image 65/series 4. 5 mm lingular nodule on image 97/series 4 is is similar to on the prior. Minimal  subpleural left lower lobe nodularity at 3 mm on image 127/series 4. Not readily apparent on the prior, favored to represent a subpleural lymph node. Musculoskeletal: No acute osseous abnormality. CT ABDOMEN  FINDINGS Hepatobiliary: High left hepatic lobe subcentimeter low-density lesion is likely a cyst. Cholecystectomy, without biliary ductal dilatation. Pancreas: Pancreatic atrophy, without duct dilatation or dominant mass. Spleen: Old granulomatous disease in the spleen. Adrenals/Urinary Tract: Normal adrenal glands. No renal calculi or hydronephrosis. Stomach/Bowel: Normal appearance of the stomach. Normal abdominal bowel loops. Vascular/Lymphatic: Aortic and branch vessel atherosclerosis. Gastrohepatic ligament adenopathy is again identified, including at 1.3 cm on image 53/series 2. Other:  No ascites.  No abdominal peritoneal or omental metastasis. Musculoskeletal: No acute osseous abnormality. IMPRESSION: 1. Soft tissue fullness in the distal esophagus, likely representing the primary site of malignancy. This is at least partially obstructive, with contrast from today's esophagram identified within a dilated more superior esophagus. 2. Isolated gastrohepatic ligament adenopathy, highly suspicious for metastatic disease. 3. No evidence of esophageal perforation. 4. Nonspecific lingular nodule, similar. 5. Coronary artery atherosclerosis. Aortic Atherosclerosis (ICD10-I70.0). Electronically Signed   By: Abigail Miyamoto M.D.   On: 12/26/2016  14:58   Ct Chest Wo Contrast  Result Date: 12/26/2016 CLINICAL DATA:  Esophageal cancer. Gastroesophageal reflux disease. Dysphagia. Endoscopy from earlier today demonstrating esophageal stenosis at approximately 40 cm from the incisors. Rule out perforation of esophagus. EXAM: CT CHEST, ABDOMEN WITH CONTRAST TECHNIQUE: Multidetector CT imaging of the chest, abdomen was performed following the standard protocol during bolus administration of intravenous contrast.  CONTRAST:  75 cc of Isovue-300 COMPARISON:  Esophagram of earlier today.  Prior CTs of 12/11/2016. FINDINGS: CT CHEST FINDINGS Cardiovascular: Aortic and branch vessel atherosclerosis. Normal heart size, without pericardial effusion. Multivessel coronary artery atherosclerosis. A right-sided PICC line terminates at the high right atrium. Mediastinum/Nodes: No supraclavicular adenopathy. No mediastinal or definite hilar adenopathy, given limitations of unenhanced CT. Soft tissue fullness at the distal esophagus including on image 50/series 2. This is similar. The more proximal esophagus is dilated and contrast filled from today's esophagram. No extraluminal contrast identified. No pneumomediastinum. Lungs/Pleura: No pleural fluid. Right upper lobe subpleural calcified granuloma on image 65/series 4. 5 mm lingular nodule on image 97/series 4 is is similar to on the prior. Minimal subpleural left lower lobe nodularity at 3 mm on image 127/series 4. Not readily apparent on the prior, favored to represent a subpleural lymph node. Musculoskeletal: No acute osseous abnormality. CT ABDOMEN  FINDINGS Hepatobiliary: High left hepatic lobe subcentimeter low-density lesion is likely a cyst. Cholecystectomy, without biliary ductal dilatation. Pancreas: Pancreatic atrophy, without duct dilatation or dominant mass. Spleen: Old granulomatous disease in the spleen. Adrenals/Urinary Tract: Normal adrenal glands. No renal calculi or hydronephrosis. Stomach/Bowel: Normal appearance of the stomach. Normal abdominal bowel loops. Vascular/Lymphatic: Aortic and branch vessel atherosclerosis. Gastrohepatic ligament adenopathy is again identified, including at 1.3 cm on image 53/series 2. Other:  No ascites.  No abdominal peritoneal or omental metastasis. Musculoskeletal: No acute osseous abnormality. IMPRESSION: 1. Soft tissue fullness in the distal esophagus, likely representing the primary site of malignancy. This is at least partially  obstructive, with contrast from today's esophagram identified within a dilated more superior esophagus. 2. Isolated gastrohepatic ligament adenopathy, highly suspicious for metastatic disease. 3. No evidence of esophageal perforation. 4. Nonspecific lingular nodule, similar. 5. Coronary artery atherosclerosis. Aortic Atherosclerosis (ICD10-I70.0). Electronically Signed   By: Abigail Miyamoto M.D.   On: 12/26/2016 14:58   Ct Chest W Contrast  Result Date: 12/11/2016 CLINICAL DATA:  Patient with progressive difficulty swallowing. EXAM: CT CHEST, ABDOMEN, AND PELVIS WITH CONTRAST TECHNIQUE: Multidetector CT imaging of the chest, abdomen and pelvis was performed following the standard protocol during bolus administration of intravenous contrast. CONTRAST:  128mL ISOVUE-300 IOPAMIDOL (ISOVUE-300) INJECTION 61% COMPARISON:  CT abdomen pelvis 05/11/2006 FINDINGS: CT CHEST FINDINGS Cardiovascular: Normal heart size. Coronary arterial vascular calcifications. Thoracic aortic vascular calcifications. Right upper extremity PICC line tip terminates in the superior vena cava. Mediastinum/Nodes: No enlarged axillary, mediastinal or hilar lymphadenopathy. There is wall thickening and narrowing of the distal esophagus (image 50; series 2). Lungs/Pleura: There is a 7 mm subpleural nodule in the lingula (image 96; series 4). Calcified granuloma within the medial right upper lobe (image 63; series 4). Dependent atelectasis/ scarring within the lower lobes bilaterally. No pleural effusion or pneumothorax. Musculoskeletal: Thoracic spine degenerative changes. No aggressive or acute appearing osseous lesions. CT ABDOMEN PELVIS FINDINGS Hepatobiliary: The liver is normal in size and contour. Subcentimeter too small to characterize low-attenuation lesion left hepatic lobe (image 50 ; series 2). Fatty deposition adjacent to the falciform ligament. Status post cholecystectomy. No intrahepatic or extrahepatic biliary ductal dilatation.  Pancreas: Mildly atrophic. Spleen: Calcified granulomas in the spleen. Adrenals/Urinary Tract: The adrenal glands are normal. Kidneys enhance symmetrically with contrast. No hydronephrosis. Urinary bladder is unremarkable. Stomach/Bowel: Colon is decompressed, limiting evaluation. Suggestion of mild colonic wall thickening. Normal morphology of the stomach. There is a 1.3 cm soft tissue nodule adjacent to the GE junction (image 53; series 2). Vascular/Lymphatic: Normal caliber abdominal aorta. Peripheral calcified atherosclerotic plaque. No retroperitoneal lymphadenopathy. Reproductive: Prostate unremarkable. Other: Bilateral fat containing inguinal hernias. Musculoskeletal: Lumbar spine degenerative changes. No aggressive or acute appearing osseous lesions. IMPRESSION: There is a 1.3 cm soft tissue nodule adjacent to the GE junction. This is favored to represent an enlarged lymph node, potentially reactive or metastatic in etiology. The colon is decompressed however there is suggestion of wall thickening, raising the possibility of colitis. There is a 7 mm nodule within the left upper lobe. Non-contrast chest CT at 6-12 months is recommended. If the nodule is stable at time of repeat CT, then future CT at 18-24 months (from today's scan) is considered optional for low-risk patients, but is recommended for high-risk patients. This recommendation follows the consensus statement: Guidelines for Management of Incidental Pulmonary Nodules Detected on CT Images: From the Fleischner Society 2017; Radiology 2017; 284:228-243. Narrowing and wall thickening of the distal esophagus, compatible with known distal esophageal stricture. Electronically Signed   By: Lovey Newcomer M.D.   On: 12/11/2016 20:02   Ct Abdomen Pelvis W Contrast  Result Date: 12/11/2016 CLINICAL DATA:  Patient with progressive difficulty swallowing. EXAM: CT CHEST, ABDOMEN, AND PELVIS WITH CONTRAST TECHNIQUE: Multidetector CT imaging of the chest,  abdomen and pelvis was performed following the standard protocol during bolus administration of intravenous contrast. CONTRAST:  146mL ISOVUE-300 IOPAMIDOL (ISOVUE-300) INJECTION 61% COMPARISON:  CT abdomen pelvis 05/11/2006 FINDINGS: CT CHEST FINDINGS Cardiovascular: Normal heart size. Coronary arterial vascular calcifications. Thoracic aortic vascular calcifications. Right upper extremity PICC line tip terminates in the superior vena cava. Mediastinum/Nodes: No enlarged axillary, mediastinal or hilar lymphadenopathy. There is wall thickening and narrowing of the distal esophagus (image 50; series 2). Lungs/Pleura: There is a 7 mm subpleural nodule in the lingula (image 96; series 4). Calcified granuloma within the medial right upper lobe (image 63; series 4). Dependent atelectasis/ scarring within the lower lobes bilaterally. No pleural effusion or pneumothorax. Musculoskeletal: Thoracic spine degenerative changes. No aggressive or acute appearing osseous lesions. CT ABDOMEN PELVIS FINDINGS Hepatobiliary: The liver is normal in size and contour. Subcentimeter too small to characterize low-attenuation lesion left hepatic lobe (image 50 ; series 2). Fatty deposition adjacent to the falciform ligament. Status post cholecystectomy. No intrahepatic or extrahepatic biliary ductal dilatation. Pancreas: Mildly atrophic. Spleen: Calcified granulomas in the spleen. Adrenals/Urinary Tract: The adrenal glands are normal. Kidneys enhance symmetrically with contrast. No hydronephrosis. Urinary bladder is unremarkable. Stomach/Bowel: Colon is decompressed, limiting evaluation. Suggestion of mild colonic wall thickening. Normal morphology of the stomach. There is a 1.3 cm soft tissue nodule adjacent to the GE junction (image 53; series 2). Vascular/Lymphatic: Normal caliber abdominal aorta. Peripheral calcified atherosclerotic plaque. No retroperitoneal lymphadenopathy. Reproductive: Prostate unremarkable. Other: Bilateral fat  containing inguinal hernias. Musculoskeletal: Lumbar spine degenerative changes. No aggressive or acute appearing osseous lesions. IMPRESSION: There is a 1.3 cm soft tissue nodule adjacent to the GE junction. This is favored to represent an enlarged lymph node, potentially reactive or metastatic in etiology. The colon is decompressed however there is suggestion of wall thickening, raising the possibility of colitis. There is a 7 mm nodule within  the left upper lobe. Non-contrast chest CT at 6-12 months is recommended. If the nodule is stable at time of repeat CT, then future CT at 18-24 months (from today's scan) is considered optional for low-risk patients, but is recommended for high-risk patients. This recommendation follows the consensus statement: Guidelines for Management of Incidental Pulmonary Nodules Detected on CT Images: From the Fleischner Society 2017; Radiology 2017; 284:228-243. Narrowing and wall thickening of the distal esophagus, compatible with known distal esophageal stricture. Electronically Signed   By: Lovey Newcomer M.D.   On: 12/11/2016 20:02   Nm Pet Image Initial (pi) Skull Base To Thigh  Result Date: 01/03/2017 CLINICAL DATA:  Initial treatment strategy for esophageal cancer. EXAM: NUCLEAR MEDICINE PET SKULL BASE TO THIGH TECHNIQUE: Twelve point for mCi F-18 FDG was injected intravenously. Full-ring PET imaging was performed from the skull base to thigh after the radiotracer. CT data was obtained and used for attenuation correction and anatomic localization. FASTING BLOOD GLUCOSE:  Value: 127 mg/dl COMPARISON:  CT chest abdomen pelvis 12/26/2016. FINDINGS: NECK: No hypermetabolic lymph nodes in the neck. CT images show no acute findings. CHEST: No hypermetabolic mediastinal, hilar or axillary lymph nodes. Distal esophageal mass measures approximately 2.5 x 2.7 cm with an SUV max of 8.0. No adjacent hypermetabolic adenopathy. No hypermetabolic pulmonary nodules. Atherosclerotic  calcification of the arterial vasculature, including coronary arteries. Right PICC tip terminates at the SVC RA junction. Heart is mildly enlarged. No pericardial or pleural effusion. Esophagus is dilated throughout its course, to the level of the above-described esophageal mass. ABDOMEN/PELVIS: No abnormal hypermetabolism in the liver, adrenal glands, spleen or pancreas. No hypermetabolic lymph nodes. Subcentimeter low-attenuation lesion in the dome of the liver is too small to characterize. Cholecystectomy. Adrenal glands, kidneys, spleen, pancreas, stomach and bowel are grossly unremarkable. Atherosclerotic calcification of the arterial vasculature without aneurysm. Prostate is mildly enlarged. No free fluid. SKELETON: No abnormal osseous hypermetabolism. IMPRESSION: 1. Hypermetabolic distal esophageal mass with associated esophageal dilatation. No hypermetabolic adenopathy or distant metastatic disease. 2. Aortic atherosclerosis (ICD10-170.0). Coronary artery calcification. Electronically Signed   By: Lorin Picket M.D.   On: 01/03/2017 14:44   Dg C-arm 1-60 Min-no Report  Result Date: 12/26/2016 Fluoroscopy was utilized by the requesting physician.  No radiographic interpretation.   Dg Esophagus W/water Sol Cm  Result Date: 12/26/2016 CLINICAL DATA:  Recent esophageal instrumentation. Pain. Evaluate for esophageal rupture. EXAM: ESOPHOGRAM/BARIUM SWALLOW TECHNIQUE: Single contrast examination was performed using water-soluble contrast. FLUOROSCOPY TIME:  1 minutes and 6 seconds COMPARISON:  None. FINDINGS: The patient ingested water-soluble contrast and imaging was obtained. There is an obstruction of the distal esophagus, just above the gastroesophageal junction. A fluid level exists in the esophagus. Contrast never traversed beyond the distal esophagus. The patient vomited. There is no evidence of extravasation to suggest leak or rupture. IMPRESSION: No evidence of contrast extravasation to  suggest esophageal injury or perforation There is abrupt obstruction of the distal esophagus just above the gastroesophageal junction. Electronically Signed   By: Marybelle Killings M.D.   On: 12/26/2016 14:46    Labs:  CBC:  Recent Labs  12/07/16 0521 12/08/16 0543 12/11/16 0402 01/10/17 1213  WBC 5.5 5.2 7.1 7.4  HGB 14.7 13.9 14.6 14.8  HCT 43.1 40.6 42.4 44.5  PLT 204 196 175 151    COAGS:  Recent Labs  12/06/16 1722 01/10/17 1213  INR 1.03 1.04  APTT  --  32    BMP:  Recent Labs  12/10/16 0519 12/11/16  0402 12/12/16 0446 01/10/17 1213  NA 141 140 137 137  K 3.8 3.8 3.7 4.0  CL 112* 111 108 106  CO2 23 24 23 24   GLUCOSE 130* 132* 138* 122*  BUN 22* 28* 29* 26*  CALCIUM 8.5* 8.4* 8.7* 9.0  CREATININE 0.84 0.91 0.97 0.98  GFRNONAA >60 >60 >60 >60  GFRAA >60 >60 >60 >60    LIVER FUNCTION TESTS:  Recent Labs  12/06/16 1722  12/09/16 0546 12/10/16 0519 12/11/16 0402 12/12/16 0446  BILITOT 1.0  --   --   --  0.5 0.6  AST 15  --   --   --  12* 11*  ALT 15*  --   --   --  14* 14*  ALKPHOS 63  --   --   --  52 53  PROT 7.7  --   --   --  6.2* 6.1*  ALBUMIN 4.4  < > 3.6 3.4* 3.4* 3.5  < > = values in this interval not displayed.  TUMOR MARKERS: No results for input(s): AFPTM, CEA, CA199, CHROMGRNA in the last 8760 hours.  Assessment and Plan:  esopohageal ca with dysphagia.  Here for power port insertion to begin chemo.  Risks and benefits discussed with the patient including, but not limited to bleeding, infection, pneumothorax, or fibrin sheath development and need for additional procedures. All of the patient's questions were answered, patient is agreeable to proceed. Consent signed and in chart.    Thank you for this interesting consult.  I greatly enjoyed meeting KAVIN WECKWERTH and look forward to participating in their care.  A copy of this report was sent to the requesting provider on this date.  Electronically Signed: Greggory Keen,  MD 01/10/2017, 2:04 PM   I spent a total of    25 Minutes in face to face in clinical consultation, greater than 50% of which was counseling/coordinating care for this patient with esophageal cancer.

## 2017-01-11 ENCOUNTER — Inpatient Hospital Stay: Payer: Medicare Other | Attending: Oncology

## 2017-01-11 ENCOUNTER — Telehealth: Payer: Self-pay

## 2017-01-11 DIAGNOSIS — E860 Dehydration: Secondary | ICD-10-CM | POA: Insufficient documentation

## 2017-01-11 DIAGNOSIS — I700 Atherosclerosis of aorta: Secondary | ICD-10-CM | POA: Insufficient documentation

## 2017-01-11 DIAGNOSIS — Z79899 Other long term (current) drug therapy: Secondary | ICD-10-CM | POA: Insufficient documentation

## 2017-01-11 DIAGNOSIS — K222 Esophageal obstruction: Secondary | ICD-10-CM | POA: Insufficient documentation

## 2017-01-11 DIAGNOSIS — Z5111 Encounter for antineoplastic chemotherapy: Secondary | ICD-10-CM | POA: Insufficient documentation

## 2017-01-11 DIAGNOSIS — Z9049 Acquired absence of other specified parts of digestive tract: Secondary | ICD-10-CM | POA: Insufficient documentation

## 2017-01-11 DIAGNOSIS — K9419 Other complications of enterostomy: Secondary | ICD-10-CM | POA: Insufficient documentation

## 2017-01-11 DIAGNOSIS — K219 Gastro-esophageal reflux disease without esophagitis: Secondary | ICD-10-CM | POA: Insufficient documentation

## 2017-01-11 DIAGNOSIS — I2510 Atherosclerotic heart disease of native coronary artery without angina pectoris: Secondary | ICD-10-CM | POA: Insufficient documentation

## 2017-01-11 DIAGNOSIS — C155 Malignant neoplasm of lower third of esophagus: Secondary | ICD-10-CM | POA: Diagnosis not present

## 2017-01-11 DIAGNOSIS — T451X5S Adverse effect of antineoplastic and immunosuppressive drugs, sequela: Secondary | ICD-10-CM | POA: Insufficient documentation

## 2017-01-11 DIAGNOSIS — R5381 Other malaise: Secondary | ICD-10-CM | POA: Insufficient documentation

## 2017-01-11 DIAGNOSIS — R197 Diarrhea, unspecified: Secondary | ICD-10-CM | POA: Insufficient documentation

## 2017-01-11 DIAGNOSIS — R112 Nausea with vomiting, unspecified: Secondary | ICD-10-CM | POA: Insufficient documentation

## 2017-01-11 DIAGNOSIS — Z87891 Personal history of nicotine dependence: Secondary | ICD-10-CM | POA: Insufficient documentation

## 2017-01-11 DIAGNOSIS — R599 Enlarged lymph nodes, unspecified: Secondary | ICD-10-CM | POA: Insufficient documentation

## 2017-01-11 DIAGNOSIS — R5383 Other fatigue: Secondary | ICD-10-CM | POA: Insufficient documentation

## 2017-01-11 DIAGNOSIS — R7989 Other specified abnormal findings of blood chemistry: Secondary | ICD-10-CM | POA: Insufficient documentation

## 2017-01-11 NOTE — Telephone Encounter (Signed)
  Oncology Nurse Navigator Documentation Received call from Mr. Casey Acevedo. He was unable to afford compazine, as it was over $300. He has Zofran and Ativan also for nausea. He is unable to swallow at this time so he was instructed to use the Zofran and Ativan as needed for nausea. Navigator Location: [facility] (01/11/17 1600)   )Navigator Encounter Type: Telephone (01/11/17 1600)                                                    Time Spent with Patient: 15 (01/11/17 1600)

## 2017-01-11 NOTE — Pre-Procedure Instructions (Signed)
The following are in epic: EKG 12/07/16 Last office visit note 01/04/17 Endo and Barium Swallow 12/26/16 CT Chest and ABD 12/26/16 PET 01/03/17 PT/PTT, CBC, BMP 01/10/17

## 2017-01-11 NOTE — Progress Notes (Signed)
Please place orders in EPIC as patient has a pre-op appointment on 01/12/2017! Thank you!

## 2017-01-11 NOTE — Patient Instructions (Signed)
RYETT HAMMAN  01/11/2017   Your procedure is scheduled on: Tuesday, Nov. 6, 2018   Report to [facility] Main  Entrance   Take [place]  elevators to 3rd floor to  [place] at 8:00 AM.    Call this number if you have problems the morning of surgery [phone]    Remember: ONLY 1 PERSON MAY GO WITH YOU TO SHORT STAY TO GET  READY MORNING OF [place].   Do not eat food or drink liquids :After Midnight.   Take these medicines the morning of surgery with A SIP OF WATER: None              You may not have any metal on your body including jewelry and body piercings             Do not wear lotions, powders or perfumes, deodorant             Men may shave face and neck.   Do not bring valuables to the hospital. [place].   Contacts, dentures or bridgework may not be worn into surgery.   Leave suitcase in the car. After surgery it may be brought to your room.              Please read over the following fact sheets you were given: _____________________________________________________________________             [facility] - Preparing for Surgery Before surgery, you can play an important role.  Because skin is not sterile, your skin needs to be as free of germs as possible.  You can reduce the number of germs on your skin by washing with CHG (chlorahexidine gluconate) soap before surgery.  CHG is an antiseptic cleaner which kills germs and bonds with the skin to continue killing germs even after washing. Please DO NOT use if you have an allergy to CHG or antibacterial soaps.  If your skin becomes reddened/irritated stop using the CHG and inform your nurse when you arrive at Short Stay. Do not shave (including legs and underarms) for at least 48 hours prior to the first CHG shower.  You may shave your face/neck.  Please follow these instructions carefully:  1.  Shower with CHG Soap the night before  surgery and the  morning of Surgery.  2.  If you choose to wash your hair, wash your hair first as usual with your  normal  shampoo.  3.  After you shampoo, rinse your hair and body thoroughly to remove the  shampoo.                             4.  Use CHG as you would any other liquid soap.  You can apply chg directly  to the skin and wash                       Gently with a scrungie or clean washcloth.  5.  Apply the CHG Soap to your body ONLY FROM THE NECK DOWN.   Do not use on face/ open  Wound or open sores. Avoid contact with eyes, ears mouth and genitals (private parts).                       Wash face,  Genitals (private parts) with your normal soap.             6.  Wash thoroughly, paying special attention to the area where your surgery  will be performed.  7.  Thoroughly rinse your body with warm water from the neck down.  8.  DO NOT shower/wash with your normal soap after using and rinsing off  the CHG Soap.                9.  Pat yourself dry with a clean towel.            10.  Wear clean pajamas.            11.  Place clean sheets on your bed the night of your first shower and do not  sleep with pets. Day of Surgery : Do not apply any lotions/deodorants the morning of surgery.  Please wear clean clothes to the hospital/surgery center.  FAILURE TO FOLLOW THESE INSTRUCTIONS MAY RESULT IN THE CANCELLATION OF YOUR SURGERY  PATIENT SIGNATURE_________________________________  NURSE SIGNATURE__________________________________  ________________________________________________________________________

## 2017-01-12 ENCOUNTER — Ambulatory Visit: Payer: Self-pay | Admitting: Surgery

## 2017-01-15 ENCOUNTER — Encounter (HOSPITAL_COMMUNITY)
Admission: RE | Admit: 2017-01-15 | Discharge: 2017-01-15 | Disposition: A | Discharge status: Home / Self Care | Payer: Medicare Other | Source: Ambulatory Visit | Attending: Surgery | Admitting: Surgery

## 2017-01-15 ENCOUNTER — Other Ambulatory Visit: Payer: Self-pay

## 2017-01-15 ENCOUNTER — Encounter (HOSPITAL_COMMUNITY): Payer: Self-pay

## 2017-01-15 DIAGNOSIS — K222 Esophageal obstruction: Secondary | ICD-10-CM | POA: Diagnosis not present

## 2017-01-15 DIAGNOSIS — E43 Unspecified severe protein-calorie malnutrition: Secondary | ICD-10-CM | POA: Diagnosis not present

## 2017-01-15 DIAGNOSIS — Z6827 Body mass index (BMI) 27.0-27.9, adult: Secondary | ICD-10-CM | POA: Diagnosis not present

## 2017-01-15 DIAGNOSIS — Z9049 Acquired absence of other specified parts of digestive tract: Secondary | ICD-10-CM | POA: Diagnosis not present

## 2017-01-15 DIAGNOSIS — K219 Gastro-esophageal reflux disease without esophagitis: Secondary | ICD-10-CM | POA: Diagnosis not present

## 2017-01-15 DIAGNOSIS — C160 Malignant neoplasm of cardia: Secondary | ICD-10-CM | POA: Diagnosis present

## 2017-01-15 DIAGNOSIS — R1310 Dysphagia, unspecified: Secondary | ICD-10-CM | POA: Diagnosis not present

## 2017-01-15 DIAGNOSIS — Z8249 Family history of ischemic heart disease and other diseases of the circulatory system: Secondary | ICD-10-CM | POA: Diagnosis not present

## 2017-01-16 ENCOUNTER — Inpatient Hospital Stay (HOSPITAL_COMMUNITY): Payer: Medicare Other | Admitting: Certified Registered Nurse Anesthetist

## 2017-01-16 ENCOUNTER — Encounter (HOSPITAL_COMMUNITY)
Admission: RE | Disposition: A | Discharge status: Home / Self Care | Payer: Self-pay | Source: Ambulatory Visit | Attending: Surgery

## 2017-01-16 ENCOUNTER — Encounter (HOSPITAL_COMMUNITY): Payer: Self-pay | Admitting: *Deleted

## 2017-01-16 ENCOUNTER — Ambulatory Visit (HOSPITAL_COMMUNITY)
Admission: RE | Admit: 2017-01-16 | Discharge: 2017-01-16 | Disposition: A | Discharge status: Home / Self Care | Payer: Medicare Other | Source: Ambulatory Visit | Attending: Surgery | Admitting: Surgery

## 2017-01-16 ENCOUNTER — Other Ambulatory Visit: Payer: Self-pay

## 2017-01-16 DIAGNOSIS — Z9049 Acquired absence of other specified parts of digestive tract: Secondary | ICD-10-CM | POA: Diagnosis not present

## 2017-01-16 DIAGNOSIS — Z934 Other artificial openings of gastrointestinal tract status: Secondary | ICD-10-CM

## 2017-01-16 DIAGNOSIS — Z8249 Family history of ischemic heart disease and other diseases of the circulatory system: Secondary | ICD-10-CM | POA: Insufficient documentation

## 2017-01-16 DIAGNOSIS — Z4801 Encounter for change or removal of surgical wound dressing: Secondary | ICD-10-CM | POA: Diagnosis present

## 2017-01-16 DIAGNOSIS — C160 Malignant neoplasm of cardia: Secondary | ICD-10-CM | POA: Diagnosis not present

## 2017-01-16 DIAGNOSIS — K219 Gastro-esophageal reflux disease without esophagitis: Secondary | ICD-10-CM | POA: Diagnosis not present

## 2017-01-16 DIAGNOSIS — Z6827 Body mass index (BMI) 27.0-27.9, adult: Secondary | ICD-10-CM | POA: Insufficient documentation

## 2017-01-16 DIAGNOSIS — K222 Esophageal obstruction: Secondary | ICD-10-CM | POA: Insufficient documentation

## 2017-01-16 DIAGNOSIS — E43 Unspecified severe protein-calorie malnutrition: Secondary | ICD-10-CM | POA: Insufficient documentation

## 2017-01-16 DIAGNOSIS — R1310 Dysphagia, unspecified: Secondary | ICD-10-CM | POA: Insufficient documentation

## 2017-01-16 HISTORY — PX: GASTROJEJUNOSTOMY: SHX1697

## 2017-01-16 SURGERY — GASTROJEJUNOSTOMY, LAPAROSCOPIC
Anesthesia: General | Site: Abdomen

## 2017-01-16 MED ORDER — 0.9 % SODIUM CHLORIDE (POUR BTL) OPTIME
TOPICAL | Status: DC | PRN
  Administered 2017-01-16: 1000 mL

## 2017-01-16 MED ORDER — PROPOFOL 10 MG/ML IV BOLUS
INTRAVENOUS | Status: AC
  Filled 2017-01-16: qty 20

## 2017-01-16 MED ORDER — ROCURONIUM BROMIDE 50 MG/5ML IV SOSY
PREFILLED_SYRINGE | INTRAVENOUS | Status: DC | PRN
  Administered 2017-01-16: 40 mg via INTRAVENOUS
  Administered 2017-01-16: 10 mg via INTRAVENOUS

## 2017-01-16 MED ORDER — LACTATED RINGERS IR SOLN
Status: DC | PRN
  Administered 2017-01-16: 1000 mL

## 2017-01-16 MED ORDER — ONDANSETRON HCL 4 MG/2ML IJ SOLN
INTRAMUSCULAR | Status: AC
  Filled 2017-01-16: qty 2

## 2017-01-16 MED ORDER — LIDOCAINE 2% (20 MG/ML) 5 ML SYRINGE
INTRAMUSCULAR | Status: AC
  Filled 2017-01-16: qty 5

## 2017-01-16 MED ORDER — ROCURONIUM BROMIDE 50 MG/5ML IV SOSY
PREFILLED_SYRINGE | INTRAVENOUS | Status: AC
  Filled 2017-01-16: qty 10

## 2017-01-16 MED ORDER — ACETAMINOPHEN 500 MG PO TABS: 1000.0000 mg | ORAL_TABLET | ORAL | Status: DC

## 2017-01-16 MED ORDER — SUCCINYLCHOLINE CHLORIDE 200 MG/10ML IV SOSY
PREFILLED_SYRINGE | INTRAVENOUS | Status: AC
  Filled 2017-01-16: qty 10

## 2017-01-16 MED ORDER — EPHEDRINE SULFATE 50 MG/ML IJ SOLN
INTRAMUSCULAR | Status: DC | PRN
  Administered 2017-01-16: 10 mg via INTRAVENOUS

## 2017-01-16 MED ORDER — BUPIVACAINE LIPOSOME 1.3 % IJ SUSP
20.0000 mL | Freq: Once | INTRAMUSCULAR | Status: AC
Start: 2017-01-16 — End: 2017-01-16
  Administered 2017-01-16: 20 mL
  Filled 2017-01-16: qty 20

## 2017-01-16 MED ORDER — LIDOCAINE 2% (20 MG/ML) 5 ML SYRINGE
INTRAMUSCULAR | Status: DC | PRN
  Administered 2017-01-16: 60 mg via INTRAVENOUS

## 2017-01-16 MED ORDER — LACTATED RINGERS IV SOLN
INTRAVENOUS | Status: DC
  Administered 2017-01-16: 08:00:00 via INTRAVENOUS

## 2017-01-16 MED ORDER — SUCCINYLCHOLINE CHLORIDE 200 MG/10ML IV SOSY
PREFILLED_SYRINGE | INTRAVENOUS | Status: DC | PRN
  Administered 2017-01-16: 100 mg via INTRAVENOUS

## 2017-01-16 MED ORDER — HEPARIN SODIUM (PORCINE) 5000 UNIT/ML IJ SOLN
5000.0000 [IU] | Freq: Once | INTRAMUSCULAR | Status: AC
  Administered 2017-01-16: 5000 [IU] via SUBCUTANEOUS
  Filled 2017-01-16: qty 1

## 2017-01-16 MED ORDER — ONDANSETRON HCL 4 MG/2ML IJ SOLN
INTRAMUSCULAR | Status: DC | PRN
  Administered 2017-01-16: 4 mg via INTRAVENOUS

## 2017-01-16 MED ORDER — CHLORHEXIDINE GLUCONATE CLOTH 2 % EX PADS: 6.0000 | MEDICATED_PAD | Freq: Once | CUTANEOUS | Status: DC

## 2017-01-16 MED ORDER — OXYCODONE HCL 5 MG PO TABS: 5.0000 mg | ORAL_TABLET | Freq: Once | ORAL | Status: DC | PRN

## 2017-01-16 MED ORDER — DEXAMETHASONE SODIUM PHOSPHATE 10 MG/ML IJ SOLN
INTRAMUSCULAR | Status: DC | PRN
  Administered 2017-01-16: 10 mg via INTRAVENOUS

## 2017-01-16 MED ORDER — LIDOCAINE 2% (20 MG/ML) 5 ML SYRINGE
INTRAMUSCULAR | Status: AC
  Filled 2017-01-16: qty 10

## 2017-01-16 MED ORDER — OXYCODONE HCL 5 MG/5ML PO SOLN
5.0000 mg | Freq: Once | ORAL | Status: DC | PRN
  Filled 2017-01-16: qty 5

## 2017-01-16 MED ORDER — HYDROMORPHONE HCL 1 MG/ML IJ SOLN: 0.2500 mg | INTRAMUSCULAR | Status: DC | PRN

## 2017-01-16 MED ORDER — PROPOFOL 10 MG/ML IV BOLUS
INTRAVENOUS | Status: DC | PRN
  Administered 2017-01-16: 130 mg via INTRAVENOUS

## 2017-01-16 MED ORDER — FENTANYL CITRATE (PF) 250 MCG/5ML IJ SOLN
INTRAMUSCULAR | Status: AC
  Filled 2017-01-16: qty 5

## 2017-01-16 MED ORDER — DEXAMETHASONE SODIUM PHOSPHATE 10 MG/ML IJ SOLN
INTRAMUSCULAR | Status: AC
  Filled 2017-01-16: qty 1

## 2017-01-16 MED ORDER — GABAPENTIN 300 MG PO CAPS: 300.0000 mg | ORAL_CAPSULE | ORAL | Status: DC

## 2017-01-16 MED ORDER — SUGAMMADEX SODIUM 200 MG/2ML IV SOLN
INTRAVENOUS | Status: DC | PRN
  Administered 2017-01-16: 170 mg via INTRAVENOUS

## 2017-01-16 MED ORDER — BUPIVACAINE-EPINEPHRINE (PF) 0.25% -1:200000 IJ SOLN
INTRAMUSCULAR | Status: AC
  Filled 2017-01-16: qty 30

## 2017-01-16 MED ORDER — MEPERIDINE HCL 50 MG/ML IJ SOLN: 6.2500 mg | INTRAMUSCULAR | Status: DC | PRN

## 2017-01-16 MED ORDER — PROMETHAZINE HCL 25 MG/ML IJ SOLN: 6.2500 mg | INTRAMUSCULAR | Status: DC | PRN

## 2017-01-16 MED ORDER — FENTANYL CITRATE (PF) 100 MCG/2ML IJ SOLN
INTRAMUSCULAR | Status: AC
  Filled 2017-01-16: qty 2

## 2017-01-16 MED ORDER — CELECOXIB 200 MG PO CAPS: 200.0000 mg | ORAL_CAPSULE | ORAL | Status: DC

## 2017-01-16 MED ORDER — EPHEDRINE 5 MG/ML INJ
INTRAVENOUS | Status: AC
  Filled 2017-01-16: qty 10

## 2017-01-16 MED ORDER — CEFOTETAN DISODIUM-DEXTROSE 2-2.08 GM-%(50ML) IV SOLR
2.0000 g | INTRAVENOUS | Status: AC
  Administered 2017-01-16: 2 g via INTRAVENOUS
  Filled 2017-01-16: qty 50

## 2017-01-16 MED ORDER — FENTANYL CITRATE (PF) 250 MCG/5ML IJ SOLN
INTRAMUSCULAR | Status: DC | PRN
  Administered 2017-01-16 (×2): 50 ug via INTRAVENOUS
  Administered 2017-01-16: 25 ug via INTRAVENOUS
  Administered 2017-01-16: 50 ug via INTRAVENOUS
  Administered 2017-01-16: 25 ug via INTRAVENOUS
  Administered 2017-01-16: 50 ug via INTRAVENOUS

## 2017-01-16 MED ORDER — SUGAMMADEX SODIUM 200 MG/2ML IV SOLN
INTRAVENOUS | Status: AC
  Filled 2017-01-16: qty 2

## 2017-01-16 MED ORDER — ROCURONIUM BROMIDE 50 MG/5ML IV SOSY
PREFILLED_SYRINGE | INTRAVENOUS | Status: AC
  Filled 2017-01-16: qty 5

## 2017-01-16 MED ORDER — ESMOLOL HCL 100 MG/10ML IV SOLN
INTRAVENOUS | Status: AC
  Filled 2017-01-16: qty 10

## 2017-01-16 MED ORDER — LABETALOL HCL 5 MG/ML IV SOLN
INTRAVENOUS | Status: AC
  Filled 2017-01-16: qty 4

## 2017-01-16 SURGICAL SUPPLY — 64 items
ADH SKN CLS APL DERMABOND .7 (GAUZE/BANDAGES/DRESSINGS) ×1
APL SKNCLS STERI-STRIP NONHPOA (GAUZE/BANDAGES/DRESSINGS) ×1
APPLIER CLIP ROT 10 11.4 M/L (STAPLE)
APR CLP MED LRG 11.4X10 (STAPLE)
BENZOIN TINCTURE PRP APPL 2/3 (GAUZE/BANDAGES/DRESSINGS) ×3 IMPLANT
CABLE HIGH FREQUENCY MONO STRZ (ELECTRODE) ×2 IMPLANT
CATH ROBINSON RED A/P 14FR (CATHETERS) ×2 IMPLANT
CATH ROBINSON RED A/P 16FR (CATHETERS) ×2 IMPLANT
CELL SAVER LIPIGURD (MISCELLANEOUS) ×1 IMPLANT
CLIP APPLIE ROT 10 11.4 M/L (STAPLE) IMPLANT
CLOSURE WOUND 1/2 X4 (GAUZE/BANDAGES/DRESSINGS)
DECANTER SPIKE VIAL GLASS SM (MISCELLANEOUS) ×3 IMPLANT
DERMABOND ADVANCED (GAUZE/BANDAGES/DRESSINGS) ×2
DERMABOND ADVANCED .7 DNX12 (GAUZE/BANDAGES/DRESSINGS) IMPLANT
DEVICE RETRIEVAL ALEXIS 14 (MISCELLANEOUS) IMPLANT
DEVICE SUT QUICK LOAD TK 5 (STAPLE) IMPLANT
DEVICE SUT TI-KNOT TK 5X26 (MISCELLANEOUS) IMPLANT
DEVICE SUTURE ENDOST 10MM (ENDOMECHANICALS) ×3 IMPLANT
DEVICE TI KNOT TK5 (MISCELLANEOUS)
DISSECTOR BLUNT TIP ENDO 5MM (MISCELLANEOUS) ×3 IMPLANT
DRAIN PENROSE 18X1/2 LTX STRL (DRAIN) ×3 IMPLANT
DRAPE LAPAROSCOPIC ABDOMINAL (DRAPES) ×3 IMPLANT
DRSG TEGADERM 2-3/8X2-3/4 SM (GAUZE/BANDAGES/DRESSINGS) ×4 IMPLANT
DRSG TEGADERM 4X4.75 (GAUZE/BANDAGES/DRESSINGS) ×2 IMPLANT
ELECT PENCIL ROCKER SW 15FT (MISCELLANEOUS) IMPLANT
ELECT REM PT RETURN 15FT ADLT (MISCELLANEOUS) ×3 IMPLANT
EXTRT SYSTEM ALEXIS 14CM (MISCELLANEOUS) ×3
GLOVE BIOGEL M 8.0 STRL (GLOVE) ×3 IMPLANT
GLOVE BIOGEL PI IND STRL 7.0 (GLOVE) IMPLANT
GLOVE BIOGEL PI INDICATOR 7.0 (GLOVE)
GOWN SPEC L4 XLG W/TWL (GOWN DISPOSABLE) ×3 IMPLANT
GOWN STRL REUS W/TWL LRG LVL3 (GOWN DISPOSABLE) ×3 IMPLANT
GOWN STRL REUS W/TWL XL LVL3 (GOWN DISPOSABLE) ×9 IMPLANT
KIT BASIN OR (CUSTOM PROCEDURE TRAY) ×3 IMPLANT
PAD POSITIONING PINK XL (MISCELLANEOUS) ×2 IMPLANT
POSITIONER SURGICAL ARM (MISCELLANEOUS) IMPLANT
QUICK LOAD TK 5 (STAPLE)
SCISSORS LAP 5X35 DISP (ENDOMECHANICALS) ×3 IMPLANT
SET IRRIG TUBING LAPAROSCOPIC (IRRIGATION / IRRIGATOR) ×3 IMPLANT
SHEARS HARMONIC ACE PLUS 36CM (ENDOMECHANICALS) ×3 IMPLANT
SLEEVE ADV FIXATION 5X100MM (TROCAR) ×2 IMPLANT
SOLUTION ANTI FOG 6CC (MISCELLANEOUS) ×3 IMPLANT
STAPLER VISISTAT 35W (STAPLE) ×3 IMPLANT
STRIP CLOSURE SKIN 1/2X4 (GAUZE/BANDAGES/DRESSINGS) IMPLANT
SUT ETHILON 3 0 PS 1 (SUTURE) ×2 IMPLANT
SUT NOVA NAB DX-16 0-1 5-0 T12 (SUTURE) ×4 IMPLANT
SUT SILK 3 0 SH CR/8 (SUTURE) ×2 IMPLANT
SUT SURGIDAC NAB ES-9 0 48 120 (SUTURE) ×12 IMPLANT
SUT VIC AB 3-0 SH 18 (SUTURE) ×2 IMPLANT
SUT VIC AB 4-0 SH 18 (SUTURE) ×3 IMPLANT
SYR 30ML LL (SYRINGE) ×3 IMPLANT
TAPE CLOTH 4X10 WHT NS (GAUZE/BANDAGES/DRESSINGS) IMPLANT
TIP INNERVISION DETACH 40FR (MISCELLANEOUS) IMPLANT
TIP INNERVISION DETACH 50FR (MISCELLANEOUS) IMPLANT
TIP INNERVISION DETACH 56FR (MISCELLANEOUS) IMPLANT
TIPS INNERVISION DETACH 40FR (MISCELLANEOUS)
TRAY LAPAROSCOPIC (CUSTOM PROCEDURE TRAY) ×3 IMPLANT
TROCAR ADV FIXATION 11X100MM (TROCAR) IMPLANT
TROCAR ADV FIXATION 5X100MM (TROCAR) ×2 IMPLANT
TROCAR BLADELESS OPT 5 100 (ENDOMECHANICALS) ×3 IMPLANT
TROCAR XCEL BLUNT TIP 100MML (ENDOMECHANICALS) IMPLANT
TROCAR XCEL NON-BLD 11X100MML (ENDOMECHANICALS) ×3 IMPLANT
TROCAR XCEL UNIV SLVE 11M 100M (ENDOMECHANICALS) IMPLANT
TUBING INSUF HEATED (TUBING) ×3 IMPLANT

## 2017-01-16 NOTE — Anesthesia Procedure Notes (Signed)
Procedure Name: Intubation Date/Time: 01/16/2017 10:56 AM Performed by: Maxwell Caul, CRNA Pre-anesthesia Checklist: Patient identified, Emergency Drugs available, Suction available and Patient being monitored Patient Re-evaluated:Patient Re-evaluated prior to induction Oxygen Delivery Method: Circle system utilized Preoxygenation: Pre-oxygenation with 100% oxygen Induction Type: IV induction Laryngoscope Size: Mac and 4 Grade View: Grade III Tube type: Oral Tube size: 7.5 mm Number of attempts: 1 Airway Equipment and Method: Stylet Placement Confirmation: ETT inserted through vocal cords under direct vision,  positive ETCO2 and breath sounds checked- equal and bilateral Secured at: 21 cm Tube secured with: Tape Dental Injury: Teeth and Oropharynx as per pre-operative assessment  Comments: Choice to not mask ventilate patient. DL x 1 with MAC 4 and small amount of clear fluid noted in back of mouth, suctioned and ETT 7.5 gently placed.

## 2017-01-16 NOTE — H&P (Signed)
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  He is seeing Dr. Lanelle Bal and was referred for a feeding jejunostomy.  I discussed the procedure with him in the holding area and performed his history and physical.  Had a prior lap chole and other than that has had no other problems.  Past Medical History:  Diagnosis Date  . Cancer (HCC)    esophageal  . Dysphagia   . GERD (gastroesophageal reflux disease)     Past Surgical History:  Procedure Laterality Date  . CHOLECYSTECTOMY    . EYE SURGERY    . IR FLUORO GUIDE PORT INSERTION RIGHT  01/10/2017  . PICC LINE INSERTION Right     Current Facility-Administered Medications  Medication Dose Route Frequency Provider Last Rate Last Dose  . acetaminophen (TYLENOL) tablet 1,000 mg  1,000 mg Oral On Call to OR Johnathan Hausen, MD      . cefoTEtan in Dextrose 5% (CEFOTAN) IVPB 2 g  2 g Intravenous On Call to OR Johnathan Hausen, MD      . celecoxib (CELEBREX) capsule 200 mg  200 mg Oral On Call to OR Johnathan Hausen, MD      . Chlorhexidine Gluconate Cloth 2 % PADS 6 each  6 each Topical Once Johnathan Hausen, MD       And  . Chlorhexidine Gluconate Cloth 2 % PADS 6 each  6 each Topical Once Johnathan Hausen, MD      . gabapentin (NEURONTIN) capsule 300 mg  300 mg Oral On Call to OR Johnathan Hausen, MD      . lactated ringers infusion   Intravenous Continuous Nolon Nations, MD 20 mL/hr at 01/16/17 9622     Patient has no known allergies. Family History  Problem Relation Age of Onset  . Heart failure Father   . Prostate cancer Neg Hx   . Bladder Cancer Neg Hx   . Kidney cancer Neg Hx    Social History:   reports that  has never smoked. he has never used smokeless tobacco. He reports that he does not drink alcohol or use drugs.   REVIEW OF SYSTEMS : Negative except for see problem  list  Physical Exam:   Blood pressure (!) 142/78, pulse 98, temperature 97.9 F (36.6 C), temperature source Oral, resp. rate 16, height 5\' 9"  (1.753 m), weight 83.5 kg (184 lb), SpO2 98 %. Body mass index is 27.17 kg/m.  Gen:  WDWN white male NAD  Neurological: Alert and oriented to person, place, and time. Motor and sensory function is grossly intact  Head: Normocephalic and atraumatic.  Eyes: Conjunctivae are normal. Pupils are equal, round, and reactive to light. No scleral icterus.  Neck: Normal range of motion. Neck supple. No tracheal deviation or thyromegaly present.  Cardiovascular:  SR without murmurs or gallops.  No carotid bruits Breast: Not examined Respiratory: Effort normal.  No respiratory distress. No chest wall tenderness. Breath sounds normal.  No wheezes, rales or rhonchi.  Abdomen: Flat and nontender.  He has a x-ray marker about 3 fingerbreadths above his umbilicus. GU: Not examined Musculoskeletal: Normal range of motion. Extremities are nontender. No cyanosis, edema or clubbing noted Lymphadenopathy: No cervical, preauricular, postauricular or axillary adenopathy is present Skin: Skin is warm and dry. No rash noted. No diaphoresis. No erythema. No pallor. Pscyh: Normal mood and affect. Behavior is normal.  Judgment and thought content normal.   LABORATORY RESULTS: No results found for this or any previous visit (from the past 48 hour(s)).   RADIOLOGY RESULTS: No results found.  Problem List: Patient Active Problem List   Diagnosis Date Noted  . 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 Goals of care, counseling/discussion 01/04/2017  . Protein-calorie malnutrition, severe 12/07/2016  . 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 Dysphagia 12/06/2016  . 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 Hassell Done, MD,  [facility] [phone] beeper [phone]  01/16/2017 10:18 AM

## 2017-01-16 NOTE — ED Triage Notes (Addendum)
Pt had JP drain put in today to left abd due to esophageal cancer, starts radiation for the same in the AM. Patients wife got concerned about dressing being saturated with blood under tegaderm. Called on call md and was told to come to the ED. Pt states he has no concerns, has no pain or discomfort states "I feel fine my wife wanted me to come".

## 2017-01-16 NOTE — Anesthesia Preprocedure Evaluation (Addendum)
Anesthesia Evaluation  Patient identified by MRN, date of birth, ID band Patient awake    Reviewed: Allergy & Precautions, H&P , NPO status , Patient's Chart, lab work & pertinent test results  History of Anesthesia Complications Negative for: history of anesthetic complications  Airway Mallampati: III  TM Distance: <3 FB Neck ROM: limited    Dental no notable dental hx. (+) Poor Dentition, Chipped   Pulmonary neg pulmonary ROS, neg shortness of breath,    Pulmonary exam normal breath sounds clear to auscultation       Cardiovascular Exercise Tolerance: Good (-) angina(-) Past MI and (-) DOE negative cardio ROS Normal cardiovascular exam Rhythm:Regular Rate:Normal     Neuro/Psych negative neurological ROS  negative psych ROS   GI/Hepatic Neg liver ROS, GERD  Medicated and Controlled,  Endo/Other  negative endocrine ROS  Renal/GU negative Renal ROS  negative genitourinary   Musculoskeletal   Abdominal   Peds  Hematology negative hematology ROS (+)   Anesthesia Other Findings Past Medical History: No date: Dysphagia No date: GERD (gastroesophageal reflux disease)  Past Surgical History: No date: CHOLECYSTECTOMY 12/07/2016: ESOPHAGOGASTRODUODENOSCOPY (EGD) WITH PROPOFOL; N/A     Comment:  Procedure: ESOPHAGOGASTRODUODENOSCOPY (EGD) WITH               PROPOFOL;  Surgeon: Jonathon Bellows, MD;  Location: [facility]               ENDOSCOPY;  Service: Gastroenterology;  Laterality: N/A; No date: EYE SURGERY     Reproductive/Obstetrics negative OB ROS                            Anesthesia Physical  Anesthesia Plan  ASA: IV  Anesthesia Plan: General   Post-op Pain Management:    Induction: Intravenous  PONV Risk Score and Plan: 2 and Ondansetron and Midazolam  Airway Management Planned: Oral ETT  Additional Equipment:   Intra-op Plan:   Post-operative Plan: Extubation in  OR  Informed Consent: I have reviewed the patients History and Physical, chart, labs and discussed the procedure including the risks, benefits and alternatives for the proposed anesthesia with the patient or authorized representative who has indicated his/her understanding and acceptance.   Dental Advisory Given  Plan Discussed with: Anesthesiologist, CRNA and Surgeon  Anesthesia Plan Comments: (  Patient consented for risks of anesthesia including but not limited to:  - adverse reactions to medications - damage to teeth, lips or other oral mucosa - sore throat or hoarseness - Damage to heart, brain, lungs or loss of life  Patient voiced understanding.)        Anesthesia Quick Evaluation

## 2017-01-16 NOTE — Interval H&P Note (Signed)
History and Physical Interval Note:  01/16/2017 10:25 AM  Casey Acevedo  has presented today for surgery, with the diagnosis of malignaant neoplasm of esophagus  The various methods of treatment have been discussed with the patient and family. After consideration of risks, benefits and other options for treatment, the patient has consented to  Procedure(s): LAPROSCOPIC ASSIST FEEDING JEJUNOSTOMY TUBE (N/A) as a surgical intervention .  The patient's history has been reviewed, patient examined, no change in status, stable for surgery.  I have reviewed the patient's chart and labs.  Questions were answered to the patient's satisfaction.     Casey Acevedo B

## 2017-01-16 NOTE — Telephone Encounter (Signed)
Patient called answering service and have a concern that his J tube site is bleeding. Patient has J tube placed today by Dr.Martin Rodman Key. He reports that his dress is fully soaked and he is concerned.  He tried to contact Dr. Earlie Server service earlier but no call back. So he called answering service and want to speak to [place]. I explained to him that I am on call physician covering the cancer center service tonight. Since he is concerned about excessive bleeding at the site of J tube, I advise patient to go to ER and have his surgical site checked. Patient voices understanding

## 2017-01-16 NOTE — Progress Notes (Deleted)
01-15-17 EKG results discussed with Dr. Lissa Hoard. Pt asymptomatic. Per Lissa Hoard, okay to proceed with surgery.

## 2017-01-16 NOTE — ED Notes (Signed)
Visitor expresses concern over pt "bleeding everywhere" and wants pt to be taken to a room; wife to STAT desk to inquire over wait time and that pt is "gushing blood"; pt taken to triage for assessment; pt ambulatory without difficulty or distress noted; dressing in place to left lower abd around drain site; dressing is saturated with blood but no active bleeding noted around site or under bandage; pt denies that he is bleeding anymore than when originally assessed in triage and is denying need to be seen and just wants his dressing changed; instructed pt to wait for an ED provider so that his site can be examined thoroughly for any problems; pt voices understanding and agreeance

## 2017-01-16 NOTE — Transfer of Care (Signed)
Immediate Anesthesia Transfer of Care Note  Patient: Casey Acevedo  Procedure(s) Performed: LAPROSCOPIC ASSIST FEEDING JEJUNOSTOMY TUBE (N/A Abdomen)  Patient Location: PACU  Anesthesia Type:General  Level of Consciousness: awake, alert  and oriented  Airway & Oxygen Therapy: Patient Spontanous Breathing and Patient connected to face mask oxygen  Post-op Assessment: Report given to RN and Post -op Vital signs reviewed and stable  Post vital signs: Reviewed and stable  Last Vitals:  Vitals:   01/16/17 0809  BP: (!) 142/78  Pulse: 98  Resp: 16  Temp: 36.6 C  SpO2: 98%    Last Pain:  Vitals:   01/16/17 0809  TempSrc: Oral  PainSc:       Patients Stated Pain Goal: 4 (16/10/96 0454)  Complications: No apparent anesthesia complications

## 2017-01-16 NOTE — Anesthesia Postprocedure Evaluation (Signed)
Anesthesia Post Note  Patient: [name]  Procedure(s) Performed: LAPROSCOPIC ASSIST FEEDING JEJUNOSTOMY TUBE (N/A Abdomen)     Patient location during evaluation: PACU Anesthesia Type: General Level of consciousness: awake and alert Pain management: pain level controlled Vital Signs Assessment: post-procedure vital signs reviewed and stable Respiratory status: spontaneous breathing, nonlabored ventilation and respiratory function stable Cardiovascular status: blood pressure returned to baseline and stable Postop Assessment: no apparent nausea or vomiting Anesthetic complications: no    Last Vitals:  Vitals:   01/16/17 1340 01/16/17 1408  BP: 137/62 107/73  Pulse: 100 (!) 105  Resp: 14 16  Temp: 36.8 C 36.8 C  SpO2: 95% 96%    Last Pain:  Vitals:   01/16/17 1408  TempSrc: Oral  PainSc: 0-No pain                 Lynda Rainwater

## 2017-01-16 NOTE — Op Note (Signed)
Surgeon: Kaylyn Lim, MD, FACS  Asst:  Loretha Brasil, RNFA  Anes:  general  Preop Dx: Cancer of the esophagus/prox stomach Postop Dx: same  Procedure: Lap assisted feeding jejunostomy Location Surgery: [facility] OR 2 Complications: None noted  EBL:   minimal cc  Drains: none  Description of Procedure:  The patient was taken to OR 2 .  After anesthesia was administered and the patient was prepped a timeout was performed.  The patient's radiation therapy marker was prepped into the wound.  I entered the abdomen through the right upper quadrant using a 5 mm Optiview technique without difficulty.  Patient had a large amount of omental fat that was covering the liver and the stomach.  I placed 2 more 5 mm one on the left side where I would eventually places red Robinson catheter and one in the midline.  Through these 3 5 mm ports I was able to identify the ligament of Treitz and measured downstream approximately 20 cm or so to be able to get the jejunum up to the anterior abdominal wall.  I kept it oriented.  I then made a small supraumbilical incision and brought the small intestine out into the wound.  I passed the 16 red Robinson catheter which had cut extra holes in through the anterior abdominal wall tract from the obliquely placed 5 mm trocar on the patient's left side.  This was then passed through a pursestring of Vicryl it was secured and then the tubing.  Proximally and distally the tubing was then tacked to the anterior abdominal wall with 3-0 silk's.  It was secured to the skin with a 3-0 silk and with a 4-0 nylon.  Catheter tip was placed in the end.  The incision was closed with interrupted #1 no refills and the fascia was injected with Exparel.  Wound was closed with subcuticular Monocryl and Dermabond.  The patient tolerated the procedure well and was taken to the PACU in stable condition.     Matt B. Hassell Done, [place], [facility], [place]

## 2017-01-17 ENCOUNTER — Emergency Department
Admission: EM | Admit: 2017-01-17 | Discharge: 2017-01-17 | Disposition: A | Discharge status: Home / Self Care | Payer: Medicare Other | Attending: Emergency Medicine | Admitting: Emergency Medicine

## 2017-01-17 ENCOUNTER — Encounter (HOSPITAL_COMMUNITY): Payer: Self-pay | Admitting: Surgery

## 2017-01-17 ENCOUNTER — Telehealth: Payer: Self-pay | Admitting: Gastroenterology

## 2017-01-17 ENCOUNTER — Ambulatory Visit
Admission: RE | Admit: 2017-01-17 | Discharge: 2017-01-17 | Disposition: A | Discharge status: Home / Self Care | Payer: Medicare Other | Source: Ambulatory Visit | Attending: Radiation Oncology | Admitting: Radiation Oncology

## 2017-01-17 DIAGNOSIS — Z5189 Encounter for other specified aftercare: Secondary | ICD-10-CM

## 2017-01-17 NOTE — Discharge Instructions (Signed)
Your dressing has been changed. Return to the ER for recurrent or worsening symptoms, persistent vomiting, difficulty breathing or other concerns.

## 2017-01-17 NOTE — ED Notes (Signed)
Patient not seen by this RN prior to discharge d/t CPR in progress in adjacent room.

## 2017-01-17 NOTE — Telephone Encounter (Signed)
[name] w/ [facility] (pharmacist) wants you to call her regarding abnormal labs on Cristiano. [phone].

## 2017-01-17 NOTE — ED Provider Notes (Signed)
[facility] Emergency Department Provider Note   ____________________________________________   First MD Initiated Contact with Patient 01/17/17 [phone]     (approximate)  I have reviewed the triage vital signs and the nursing notes.   HISTORY  Chief Complaint Post-op Problem    HPI Casey Acevedo is a 76 y.o. male who presents to the ED from home requesting a dressing change.Patient has a history of esophageal cancer who had laparoscopic assisted feeding jejunostomy placed yesterday afternoon. He was sent home with supplies to change his bandages. However, his wife was too fearful to change his bandage because it had soaked through with blood. Patient voices no complaints and is scheduled for radiation this morning. Denies fever, chills, chest pain, shortness of breath, abdominal pain, nausea, vomiting, feeling lightheaded or dizzy.   Past Medical History:  Diagnosis Date  . Cancer (HCC)    esophageal  . Dysphagia   . GERD (gastroesophageal reflux disease)     Patient Active Problem List   Diagnosis Date Noted  . S/P jejunostomy ([place]) 01/16/2017  . Malignant neoplasm of lower third of esophagus ([place]) 01/04/2017  . Goals of care, counseling/discussion 01/04/2017  . Protein-calorie malnutrition, severe 12/07/2016  . 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 Dysphagia 12/06/2016  . GERD (gastroesophageal reflux disease) 06/01/2016    Past Surgical History:  Procedure Laterality Date  . CHOLECYSTECTOMY    . EYE SURGERY    . IR FLUORO GUIDE PORT INSERTION RIGHT  01/10/2017  . PICC LINE INSERTION Right     Prior to Admission medications   Medication Sig Start Date End Date Taking? Authorizing Provider  dexamethasone (DECADRON) 4 MG tablet Take 2 tablets (8 mg total) by mouth daily. Start the day after chemotherapy for 2 days. 01/05/17   Sindy Guadeloupe, MD  dexamethasone 0.5 MG/5ML elixir 80 mLs (8 mg total) by Per J Tube route daily. Start the day after  chemotherapy and take for 2 days (after every chemotherapy treatment) 01/16/17 02/20/17  Sindy Guadeloupe, MD  lidocaine-prilocaine (EMLA) cream Apply to affected area once 01/05/17   Sindy Guadeloupe, MD  LORazepam (ATIVAN) 0.5 MG tablet Take 1 tablet (0.5 mg total) by mouth every 6 (six) hours as needed (Nausea or vomiting). 01/05/17   Sindy Guadeloupe, MD  LORazepam (ATIVAN) 2 MG/ML concentrated solution Place 0.3 mLs (0.6 mg total) into feeding tube every 6 (six) hours as needed (nausea and vomiting). Nausea and vomiting 01/09/17   Sindy Guadeloupe, MD  ondansetron (ZOFRAN ODT) 4 MG disintegrating tablet Take 1 tablet (4 mg total) by mouth every 8 (eight) hours as needed for nausea or vomiting. 01/04/17   Sindy Guadeloupe, MD  ondansetron [facility]) 4 MG/5ML solution Place 10 mLs (8 mg total) into feeding tube 2 (two) times daily. As needed for nausea and vomiting 01/09/17   Sindy Guadeloupe, MD  ondansetron (ZOFRAN) 8 MG tablet Take 1 tablet (8 mg total) by mouth 2 (two) times daily as needed for refractory nausea / vomiting. Start on day 3 after chemo. 01/05/17   Sindy Guadeloupe, MD  prochlorperazine (COMPAZINE) 10 MG tablet Take 1 tablet (10 mg total) by mouth every 6 (six) hours as needed (Nausea or vomiting). 01/05/17   Sindy Guadeloupe, MD  prochlorperazine (COMPAZINE) 5 MG/ML injection Inject 2 mLs (10 mg total) into the vein every 6 (six) hours as needed (nausea and vomiting). 01/09/17   Sindy Guadeloupe, MD    Allergies Patient has no  known allergies.  Family History  Problem Relation Age of Onset  . Heart failure Father   . Prostate cancer Neg Hx   . Bladder Cancer Neg Hx   . Kidney cancer Neg Hx     Social History Social History   Tobacco Use  . Smoking status: Never Smoker  . Smokeless tobacco: Never Used  Substance Use Topics  . Alcohol use: No  . Drug use: No    Review of Systems  Constitutional: No fever/chills Eyes: No visual changes. ENT: No sore throat. Cardiovascular: Denies  chest pain. Respiratory: Denies shortness of breath. Gastrointestinal: positive for requesting dressing change. No abdominal pain.  No nausea, no vomiting.  No diarrhea.  No constipation. Genitourinary: Negative for dysuria. Musculoskeletal: Negative for back pain. Skin: Negative for rash. Neurological: Negative for headaches, focal weakness or numbness.   ____________________________________________   PHYSICAL EXAM:  VITAL SIGNS: ED Triage Vitals  Enc Vitals Group     BP 01/16/17 2127 (!) 152/74     Pulse Rate 01/16/17 2127 (!) 116     Resp 01/16/17 2127 20     Temp 01/16/17 2127 98.3 F (36.8 C)     Temp Source 01/16/17 2127 Oral     SpO2 01/16/17 2127 92 %     Weight 01/16/17 2128 180 lb (81.6 kg)     Height 01/16/17 2128 5\' 9"  (1.753 m)     Head Circumference --      Peak Flow --      Pain Score 01/16/17 2126 0     Pain Loc --      Pain Edu? --      Excl. in [place]? --     Constitutional: Alert and oriented. Well appearing and in no acute distress. Eyes: Conjunctivae are normal. PERRL. EOMI. Head: Atraumatic. Nose: No congestion/rhinnorhea. Mouth/Throat: Mucous membranes are moist.  Oropharynx non-erythematous. Neck: No stridor.  No carotid bruits. Cardiovascular: Normal rate, regular rhythm. Grossly normal heart sounds.  Good peripheral circulation. Respiratory: Normal respiratory effort.  No retractions. Lungs CTAB. Gastrointestinal: Jejunostomy tube in place. Blood-soaked 2 x 2 bandages. Bandages removed: no active bleeding from his surgical site. No loose or missing sutures. Soft and nontender. No distention. No abdominal bruits. No CVA tenderness. Musculoskeletal: No lower extremity tenderness nor edema.  No joint effusions. Neurologic:  Normal speech and language. No gross focal neurologic deficits are appreciated. No gait instability. Skin:  Skin is warm, dry and intact. No rash noted. Psychiatric: Mood and affect are normal. Speech and behavior are  normal.  ____________________________________________   LABS (all labs ordered are listed, but only abnormal results are displayed)  Labs Reviewed - No data to display ____________________________________________  EKG  none ____________________________________________  RADIOLOGY  No results found.  ____________________________________________   PROCEDURES  Procedure(s) performed: None  Procedures  Critical Care performed: No  ____________________________________________   INITIAL IMPRESSION / ASSESSMENT AND PLAN / ED COURSE  As part of my medical decision making, I reviewed the following data within the electronic MEDICAL RECORD NUMBER History obtained from family, Nursing notes reviewed and incorporated and Notes from prior ED visits   76 year old male with esophageal cancer status post laparoscopic assisted feeding jejunostomy ostomy yesterday afternoon who presents with blood soaked bandages which need changing. Spouse was reticent to change the bandages and wanted the site evaluated. insertion site cleaned, no active bleeding. Dressing changed by PA student Matt, supervised and viewed by myself. Strict return precautions given. Both verbalize understanding and agree with plan  of care.      ____________________________________________   FINAL CLINICAL IMPRESSION(S) / ED DIAGNOSES  Final diagnoses:  Visit for wound check     ED Discharge Orders    None       Note:  This document was prepared using Dragon voice recognition software and may include unintentional dictation errors.    Paulette Blanch, MD 01/17/17 [phone]

## 2017-01-18 ENCOUNTER — Other Ambulatory Visit: Payer: Self-pay

## 2017-01-18 ENCOUNTER — Ambulatory Visit: Payer: Medicare Other

## 2017-01-18 ENCOUNTER — Inpatient Hospital Stay: Payer: Medicare Other

## 2017-01-18 ENCOUNTER — Ambulatory Visit
Admission: RE | Admit: 2017-01-18 | Discharge: 2017-01-18 | Disposition: A | Discharge status: Home / Self Care | Payer: Medicare Other | Source: Ambulatory Visit | Attending: Radiation Oncology | Admitting: Radiation Oncology

## 2017-01-18 DIAGNOSIS — C155 Malignant neoplasm of lower third of esophagus: Secondary | ICD-10-CM | POA: Diagnosis not present

## 2017-01-18 DIAGNOSIS — R899 Unspecified abnormal finding in specimens from other organs, systems and tissues: Secondary | ICD-10-CM

## 2017-01-18 MED ORDER — OSMOLITE 1.5 CAL PO LIQD: ORAL | 0 refills | Status: DC

## 2017-01-18 NOTE — Progress Notes (Signed)
Nutrition Follow-up:  Patient with squamous cell esophageal cancer.  J-tube placed on 11/6 same day procedure.  Patient has started radiation.  Planning to start chemotherapy tomorrow.    Met with patient and wife in clinic this am following radiation appointment.  Patient continues on TPN 24 hours per day.  No nutrition orally due to esophageal mass.    TPN prescription per [facility]: 2064m over 24 hours, dext 300 gm, Freamine 110 gm, nutrilipid 60 gm, Na 81 meq, Phos 241m Ca 10 meq, Mag 10 meq.    Medications: dexamethasone, ativan, zofran disintegrating tablet, compazine  Labs: no new labs since 10/31  Anthropometrics:   No new weight taken today. Last weight of 180 lb taken on 10/25.   Estimated Energy Needs  Kcals: 2050-2460 calories/d Protein: 102-123 g/d Fluid: 2.4 L/d  NUTRITION DIAGNOSIS: Inadequate oral intake continues but nutrition being addressed with TPN and now starting J-tube feeding   MALNUTRITION DIAGNOSIS: continue to monitor   INTERVENTION:  Spoke with MD RaJanese Banksnd agreeable to starting tube feeding via J-tube and transitioning patient off of TPN.   Recommend osmolite 1.5 at goal rate of 8637mr for 18 hours per day via J-tube. Will likely need promod 58m37mily for additional protein.  This will provide 2422 kcals, 107 g of protein and 1176ml60me water (in formula only).   Discussed with patient and wife today titration of tube feeding and TPN.  Instructions were reviewed and printed for patient and wife. Thursday/Friday (11/7-11/8) Start tube feeding of osmolite 1.5 at 20ml/15mor 18 hours. Flush feeding tube with 60ml o21mter before starting feeding and after stopping feeding.  Give additional 60ml of68mer BID during waking hours. Keep TPN at standard rate. Saturday/Sunday - Start tube feeding of osmolite 1.5 at 40ml/hr 71m18 hours.  Flush feeding tube with 120ml of w29m before starting feeding and after stopping feeding.  Give additional 120ml  of w63m 2 other times during waking hours. Reduce rate of TPN to 40ml/hr for56mhours ([facility] through how to change rate of TPN) Monday- Start tube feeding of osmolite 1.5 at 60ml/hr for 38mours. Flush feeding tube with 180ml of water59more starting tube feeding and after stopping feeding. Give additional 180ml of water 13mditional times during the day. Reduce rate of TPN to 20ml/hr for 24 27muesday- Start tube feeding at 86ml/hr for 18 h32m  Flush feeding tube with 240ml of water bef68mstarting feeding and after stopping feeding. Give additional 240ml of water flus61madditional times during the day. Do not restart TPN. Tube feeding and TPN regimen was discussed with [facility] [place].  [facility] to assist with J-tube teaching and care in the home.   Reviewed with patient how to administer medications via tube and written instructions provided.  Reviewed with patient how to provide water flush and discussed briefly administering tube feeding via pump.  [facility] will provide demo once pump and supplies delivered to the home.   Patient given contact information.   Patient with questions regarding medications and met with RN, Sherry following RDJudeen Hammans.      MONITORING, EVALUATION, GOAL: weight trends, intake, labs, tube feeding tolerance, TPN   NEXT VISIT: Monday, November 12. Will plan phone call tomorrow (Friday, Nov 8)  Margit Batte B. Lauri Purdum, RD, LDN [facility] [place] [phone] ([phone]

## 2017-01-18 NOTE — Progress Notes (Signed)
Nutrition:  Order for tube feeding entered per Dr. Dione Plover B. Zenia Resides, [place], [place] Registered Dietitian [phone] (pager)

## 2017-01-18 NOTE — Progress Notes (Signed)
Spoke to patient's spouse. Patient was having radiation therapy at the time.   Advised labs ordered for tomorrow. They can be drawn at the [facility] as usual.   Received call from [facility] @ [facility] concerning abnormal labs.  Bilirubin: 0.4 on 10/11, 2.2 last wk, 3.3 11/8 Alkaline Phosphatase: 97 on 10/11, 540 last wk, 524 11/8  Contacted Dr. Vicente Males. Ordered labs and [place] of liver doppler.  Scheduled for 11/15 @ 930am @ [facility].  NPO 6-8 hours.

## 2017-01-19 ENCOUNTER — Encounter: Payer: Self-pay | Admitting: Oncology

## 2017-01-19 ENCOUNTER — Inpatient Hospital Stay: Payer: Medicare Other

## 2017-01-19 ENCOUNTER — Telehealth: Payer: Self-pay

## 2017-01-19 ENCOUNTER — Ambulatory Visit
Admission: RE | Admit: 2017-01-19 | Discharge: 2017-01-19 | Disposition: A | Discharge status: Home / Self Care | Payer: Medicare Other | Source: Ambulatory Visit | Attending: Radiation Oncology | Admitting: Radiation Oncology

## 2017-01-19 ENCOUNTER — Ambulatory Visit: Payer: Medicare Other

## 2017-01-19 ENCOUNTER — Inpatient Hospital Stay (HOSPITAL_BASED_OUTPATIENT_CLINIC_OR_DEPARTMENT_OTHER): Payer: Medicare Other | Admitting: Oncology

## 2017-01-19 VITALS — BP 123/83 | HR 97 | Temp 97.0°F | Resp 14 | Wt 182.0 lb

## 2017-01-19 VITALS — BP 125/78 | HR 88 | Resp 20

## 2017-01-19 DIAGNOSIS — R112 Nausea with vomiting, unspecified: Secondary | ICD-10-CM

## 2017-01-19 DIAGNOSIS — R599 Enlarged lymph nodes, unspecified: Secondary | ICD-10-CM | POA: Diagnosis not present

## 2017-01-19 DIAGNOSIS — I700 Atherosclerosis of aorta: Secondary | ICD-10-CM

## 2017-01-19 DIAGNOSIS — R7989 Other specified abnormal findings of blood chemistry: Secondary | ICD-10-CM | POA: Diagnosis not present

## 2017-01-19 DIAGNOSIS — I2510 Atherosclerotic heart disease of native coronary artery without angina pectoris: Secondary | ICD-10-CM | POA: Diagnosis not present

## 2017-01-19 DIAGNOSIS — K9419 Other complications of enterostomy: Secondary | ICD-10-CM | POA: Diagnosis not present

## 2017-01-19 DIAGNOSIS — R5381 Other malaise: Secondary | ICD-10-CM | POA: Diagnosis not present

## 2017-01-19 DIAGNOSIS — R945 Abnormal results of liver function studies: Secondary | ICD-10-CM

## 2017-01-19 DIAGNOSIS — Z87891 Personal history of nicotine dependence: Secondary | ICD-10-CM

## 2017-01-19 DIAGNOSIS — T451X5S Adverse effect of antineoplastic and immunosuppressive drugs, sequela: Secondary | ICD-10-CM | POA: Diagnosis not present

## 2017-01-19 DIAGNOSIS — K222 Esophageal obstruction: Secondary | ICD-10-CM | POA: Diagnosis not present

## 2017-01-19 DIAGNOSIS — R5383 Other fatigue: Secondary | ICD-10-CM

## 2017-01-19 DIAGNOSIS — Z5111 Encounter for antineoplastic chemotherapy: Secondary | ICD-10-CM

## 2017-01-19 DIAGNOSIS — K219 Gastro-esophageal reflux disease without esophagitis: Secondary | ICD-10-CM | POA: Diagnosis not present

## 2017-01-19 DIAGNOSIS — Z9049 Acquired absence of other specified parts of digestive tract: Secondary | ICD-10-CM | POA: Diagnosis not present

## 2017-01-19 DIAGNOSIS — C155 Malignant neoplasm of lower third of esophagus: Secondary | ICD-10-CM

## 2017-01-19 DIAGNOSIS — R197 Diarrhea, unspecified: Secondary | ICD-10-CM | POA: Diagnosis not present

## 2017-01-19 DIAGNOSIS — E860 Dehydration: Secondary | ICD-10-CM | POA: Diagnosis not present

## 2017-01-19 DIAGNOSIS — Z79899 Other long term (current) drug therapy: Secondary | ICD-10-CM

## 2017-01-19 LAB — COMPREHENSIVE METABOLIC PANEL
ALK PHOS: 428 U/L — AB (ref 38–126)
ALT: 280 U/L — AB (ref 17–63)
AST: 65 U/L — AB (ref 15–41)
Albumin: 3.3 g/dL — ABNORMAL LOW (ref 3.5–5.0)
Anion gap: 7 (ref 5–15)
BILIRUBIN TOTAL: 2.1 mg/dL — AB (ref 0.3–1.2)
BUN: 26 mg/dL — AB (ref 6–20)
CALCIUM: 8.7 mg/dL — AB (ref 8.9–10.3)
CO2: 24 mmol/L (ref 22–32)
CREATININE: 0.88 mg/dL (ref 0.61–1.24)
Chloride: 102 mmol/L (ref 101–111)
Glucose, Bld: 132 mg/dL — ABNORMAL HIGH (ref 65–99)
Potassium: 3.9 mmol/L (ref 3.5–5.1)
Sodium: 133 mmol/L — ABNORMAL LOW (ref 135–145)
TOTAL PROTEIN: 7.3 g/dL (ref 6.5–8.1)

## 2017-01-19 LAB — CBC WITH DIFFERENTIAL/PLATELET
BASOS PCT: 0 %
Basophils Absolute: 0 10*3/uL (ref 0–0.1)
EOS ABS: 0.1 10*3/uL (ref 0–0.7)
Eosinophils Relative: 1 %
HCT: 41.9 % (ref 40.0–52.0)
HEMOGLOBIN: 14 g/dL (ref 13.0–18.0)
Lymphocytes Relative: 17 %
Lymphs Abs: 1.6 10*3/uL (ref 1.0–3.6)
MCH: 30.7 pg (ref 26.0–34.0)
MCHC: 33.6 g/dL (ref 32.0–36.0)
MCV: 91.3 fL (ref 80.0–100.0)
Monocytes Absolute: 1.4 10*3/uL — ABNORMAL HIGH (ref 0.2–1.0)
Monocytes Relative: 14 %
NEUTROS PCT: 68 %
Neutro Abs: 6.7 10*3/uL — ABNORMAL HIGH (ref 1.4–6.5)
Platelets: 165 10*3/uL (ref 150–440)
RBC: 4.58 MIL/uL (ref 4.40–5.90)
RDW: 14 % (ref 11.5–14.5)
WBC: 9.8 10*3/uL (ref 3.8–10.6)

## 2017-01-19 LAB — GAMMA GT: GGT: 309 U/L — AB (ref 7–50)

## 2017-01-19 LAB — BILIRUBIN, FRACTIONATED(TOT/DIR/INDIR)
Bilirubin, Direct: 0.9 mg/dL — ABNORMAL HIGH (ref 0.1–0.5)
Indirect Bilirubin: 1.2 mg/dL — ABNORMAL HIGH (ref 0.3–0.9)
Total Bilirubin: 2.1 mg/dL — ABNORMAL HIGH (ref 0.3–1.2)

## 2017-01-19 MED ORDER — SODIUM CHLORIDE 0.9 % IV SOLN
Freq: Once | INTRAVENOUS | Status: AC
  Administered 2017-01-19: 12:00:00 via INTRAVENOUS
  Filled 2017-01-19: qty 1000

## 2017-01-19 MED ORDER — DIPHENHYDRAMINE HCL 50 MG/ML IJ SOLN
50.0000 mg | Freq: Once | INTRAMUSCULAR | Status: AC
  Administered 2017-01-19: 50 mg via INTRAVENOUS
  Filled 2017-01-19: qty 1

## 2017-01-19 MED ORDER — HEPARIN SOD (PORK) LOCK FLUSH 100 UNIT/ML IV SOLN
500.0000 [IU] | Freq: Once | INTRAVENOUS | Status: AC | PRN
  Administered 2017-01-19: 500 [IU]
  Filled 2017-01-19: qty 5

## 2017-01-19 MED ORDER — FAMOTIDINE IN NACL 20-0.9 MG/50ML-% IV SOLN
20.0000 mg | Freq: Once | INTRAVENOUS | Status: AC
  Administered 2017-01-19: 20 mg via INTRAVENOUS
  Filled 2017-01-19: qty 50

## 2017-01-19 MED ORDER — SODIUM CHLORIDE 0.9 % IV SOLN
195.0000 mg | Freq: Once | INTRAVENOUS | Status: AC
  Administered 2017-01-19: 200 mg via INTRAVENOUS
  Filled 2017-01-19: qty 20

## 2017-01-19 MED ORDER — SODIUM CHLORIDE 0.9 % IV SOLN
20.0000 mg | Freq: Once | INTRAVENOUS | Status: AC
Start: 2017-01-19 — End: 2017-01-19
  Administered 2017-01-19: 20 mg via INTRAVENOUS
  Filled 2017-01-19: qty 2

## 2017-01-19 MED ORDER — PALONOSETRON HCL INJECTION 0.25 MG/5ML
0.2500 mg | Freq: Once | INTRAVENOUS | Status: AC
Start: 2017-01-19 — End: 2017-01-19
  Administered 2017-01-19: 0.25 mg via INTRAVENOUS
  Filled 2017-01-19: qty 5

## 2017-01-19 MED ORDER — PACLITAXEL CHEMO INJECTION 300 MG/50ML
50.0000 mg/m2 | Freq: Once | INTRAVENOUS | Status: AC
  Administered 2017-01-19: 102 mg via INTRAVENOUS
  Filled 2017-01-19: qty 17

## 2017-01-19 NOTE — Telephone Encounter (Signed)
Nutrition  Called patient on cell phone and left message to return my call.  RD at another location during patient's clinic visit today.    Casey Acevedo, Casey Acevedo, Casey Acevedo Registered Dietitian [phone] (pager)

## 2017-01-19 NOTE — Progress Notes (Signed)
Patient here for follow up today with labs and treatment (new) with Carboplatin and Taxol. He continues to have trouble with nausea and vomited this morning.

## 2017-01-19 NOTE — Addendum Note (Signed)
Addended by: Luella Cook on: 01/19/2017 02:28 PM   Modules accepted: Orders

## 2017-01-19 NOTE — Progress Notes (Signed)
MD aware of elevated liver enzymes and wants to proceed with treatment due to extent of disease.

## 2017-01-19 NOTE — Progress Notes (Signed)
Hematology/Oncology Consult note [facility]  Telephone:([phone] [phone]  Patient Care Team: Idelle Crouch, MD as PCP - General (Internal Medicine) Clent Jacks, RN as Registered Nurse Grace Isaac, MD as Consulting Physician (Cardiothoracic Surgery) Sindy Guadeloupe, MD as Consulting Physician (Oncology) Noreene Filbert, MD as Referring Physician (Radiation Oncology)   Name of the patient: Casey Acevedo  [id number]  07/24/1940   Date of visit: 01/19/17  Diagnosis- non metastatic esophageal cancer [id number] [facility]  Chief complaint/ Reason for visit- on treatment assessment prior to cycle 1 of carbo/ taxol  Heme/Onc history: 1. Patient is a 76 year old gentleman with no significant past medical history and takes no medications he has a remote history of smokingduring his college days and quit smoking thereafter. No history of any alcohol intake. He was recently admitted to the hospital on 12/06/2016 with symptoms of progressive dysphagia which he states has been going on since the starting of September. His dysphagia got to the point that he began to have pain even on swallowing icecream. He underwent EGD at that time which showed but high 2 cm long stricture 2 cm above the GE junction. Biopsies at that time were negative for malignancy. Given that he was unable to eat he was started on TPN at that time and plan was to get a repeat EGD with possible biopsy  2. Patient underwent repeat EGD on 12/26/2016 where he was again noted to have a lower esophageal stricture which reduced the lumen to less than 3-4 mm. There was no evidence of Barrett's esophagus. 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 Shortly after the stricture was dilated the stricture did close up significantly. Postprocedure patient complained of abdominal pain and there was a  concern for perforation and hence a repeat CT abdomen was obtained which did not show any evidence of perforation  3. CT abdomen pelvis as well as CT chest with contrast on 10/01/2018showed: 7 mm sub pleural nodule in the lingula. No evidence of axillary mediastinal or hilar adenopathy.1.3 cm soft tissue nodule adjacent to the GE junctionwhich is favored to represent an enlarged lymph node potentially active or metastatic in etiology. Narrowing involving thickening of the distal esophagus compatible with known distal esophageal stricture  4. Patient has been referred to [place] for further management. He is tolerating his TPN well and otherwise reports no complaints. He lives with his wife and is independent of his ADLs and IADLs. Besides dysphagia patient reports no loss of appetite or unintentional weight loss over the last few months. He denies any pain  5. Patient seen by Dr. Servando Snare from cardiothoracic surgery from [place]. He has been deemed to be a potential surgical candidate in the future. Plan for now is to proceed with concurrent chemoradiation followed by EUS upon completion of treatment given difficulty with the   second EGD    Interval history- patient had J tube placed but has not started using it yet. He is still on TPN which hopefully come off in a weeks time after he transitions to J tube feeds. He is hardly even able to keep his secretions down and feels the need to bring it up time and again which disturbs his sleep. Feels fatigued. Denies other complaints  ECOG PS- 1 Pain scale- 0   Review of systems- Review of Systems  Constitutional: Positive for malaise/fatigue. Negative for  chills, fever and weight loss.  HENT: Negative for congestion, ear discharge and nosebleeds.   Eyes: Negative for blurred vision.  Respiratory: Negative for cough, hemoptysis, sputum production, shortness of breath and wheezing.   Cardiovascular: Negative for chest pain, palpitations, orthopnea and  claudication.  Gastrointestinal: Negative for abdominal pain, blood in stool, constipation, diarrhea, heartburn, melena, nausea and vomiting.  Genitourinary: Negative for dysuria, flank pain, frequency, hematuria and urgency.  Musculoskeletal: Negative for back pain, joint pain and myalgias.  Skin: Negative for rash.  Neurological: Negative for dizziness, tingling, focal weakness, seizures, weakness and headaches.  Endo/Heme/Allergies: Does not bruise/bleed easily.  Psychiatric/Behavioral: Negative for depression and suicidal ideas. The patient does not have insomnia.      No Known Allergies   Past Medical History:  Diagnosis Date  . Cancer (HCC)    esophageal  . Dysphagia   . GERD (gastroesophageal reflux disease)      Past Surgical History:  Procedure Laterality Date  . CHOLECYSTECTOMY    . EYE SURGERY    . IR FLUORO GUIDE PORT INSERTION RIGHT  01/10/2017  . PICC LINE INSERTION Right     Social History   Socioeconomic History  . Marital status: Married    Spouse name: Not on file  . Number of children: Not on file  . Years of education: Not on file  . Highest education level: Not on file  Social Needs  . Financial resource strain: Not on file  . Food insecurity - worry: Not on file  . Food insecurity - inability: Not on file  . Transportation needs - medical: Not on file  . Transportation needs - non-medical: Not on file  Occupational History  . Not on file  Tobacco Use  . Smoking status: Never Smoker  . Smokeless tobacco: Never Used  Substance and Sexual Activity  . Alcohol use: No  . Drug use: No  . Sexual activity: Not Currently  Other Topics Concern  . Not on file  Social History Narrative   Independent at baseline. Ambulatory    Family History  Problem Relation Age of Onset  . Heart failure Father   . Prostate cancer Neg Hx   . Bladder Cancer Neg Hx   . Kidney cancer Neg Hx      Current Outpatient Medications:  .  dexamethasone 0.5 MG/5ML  elixir, 80 mLs (8 mg total) by Per J Tube route daily. Start the day after chemotherapy and take for 2 days (after every chemotherapy treatment), Disp: 320 mL, Rfl: 3 .  lidocaine-prilocaine (EMLA) cream, Apply to affected area once, Disp: 30 g, Rfl: 3 .  Nutritional Supplements (FEEDING SUPPLEMENT, OSMOLITE 1.5 CAL,) LIQD, Thur/Fri Start osmolite 1.5 at 71ml/hr for 18 hours.  Sat/Sun Start osmolite 1.5 at 54ml/hr for 18 hour.   Monday Start osmolite 1.5 at 42ml/hr for 18 hour.  Tuesday Start osmolite 1.5 at 22ml/hr for 18 hour.  Needs pump and tube feeding supplies to bolus water flush and dressing change for J tube., Disp: 1548 mL, Rfl: 0 .  ondansetron (ZOFRAN ODT) 4 MG disintegrating tablet, Take 1 tablet (4 mg total) by mouth every 8 (eight) hours as needed for nausea or vomiting., Disp: 30 tablet, Rfl: 0 .  ondansetron (ZOFRAN) 8 MG tablet, Take 1 tablet (8 mg total) by mouth 2 (two) times daily as needed for refractory nausea / vomiting. Start on day 3 after chemo., Disp: 30 tablet, Rfl: 1 .  prochlorperazine (COMPAZINE) 10 MG tablet, Take 1 tablet (10  mg total) by mouth every 6 (six) hours as needed (Nausea or vomiting)., Disp: 30 tablet, Rfl: 1 .  LORazepam (ATIVAN) 0.5 MG tablet, Take 1 tablet (0.5 mg total) by mouth every 6 (six) hours as needed (Nausea or vomiting)., Disp: 30 tablet, Rfl: 0 .  LORazepam (ATIVAN) 2 MG/ML concentrated solution, Place 0.3 mLs (0.6 mg total) into feeding tube every 6 (six) hours as needed (nausea and vomiting). Nausea and vomiting (Patient not taking: Reported on 01/19/2017), Disp: 30 mL, Rfl: 3 No current facility-administered medications for this visit.   Facility-Administered Medications Ordered in Other Visits:  .  CARBOplatin (PARAPLATIN) 200 mg in sodium chloride 0.9 % 250 mL chemo infusion, 200 mg, Intravenous, Once, Sindy Guadeloupe, MD .  dexamethasone (DECADRON) 20 mg in sodium chloride 0.9 % 50 mL IVPB, 20 mg, Intravenous, Once, Sindy Guadeloupe, MD .   famotidine (PEPCID) IVPB 20 mg premix, 20 mg, Intravenous, Once, Sindy Guadeloupe, MD, 20 mg at 01/19/17 1152 .  heparin lock flush 100 unit/mL, 500 Units, Intracatheter, Once PRN, Sindy Guadeloupe, MD .  PACLitaxel (TAXOL) 102 mg in dextrose 5 % 250 mL chemo infusion (</= 80mg /m2), 50 mg/m2 (Treatment Plan Recorded), Intravenous, Once, Sindy Guadeloupe, MD  Physical exam:  Vitals:   01/19/17 1009 01/19/17 1018  BP:  123/83  Pulse:  97  Resp:  14  Temp:  (!) 97 F (36.1 C)  TempSrc:  Tympanic  Weight: 182 lb (82.6 kg)    Physical Exam  Constitutional: He is oriented to person, place, and time and well-developed, well-nourished, and in no distress.  HENT:  Head: Normocephalic and atraumatic.  Eyes: EOM are normal. Pupils are equal, round, and reactive to light.  Neck: Normal range of motion.  Cardiovascular: Normal rate, regular rhythm and normal heart sounds.  Pulmonary/Chest: Effort normal and breath sounds normal.  Abdominal: Soft. Bowel sounds are normal.  J tube in place.   Musculoskeletal:  Right picc connected to TPN  Neurological: He is alert and oriented to person, place, and time.  Skin: Skin is warm and dry.     CMP Latest Ref Rng & Units 01/19/2017  Glucose 65 - 99 mg/dL 132(H)  BUN 6 - 20 mg/dL 26(H)  Creatinine 0.61 - 1.24 mg/dL 0.88  Sodium 135 - 145 mmol/L 133(L)  Potassium 3.5 - 5.1 mmol/L 3.9  Chloride 101 - 111 mmol/L 102  CO2 22 - 32 mmol/L 24  Calcium 8.9 - 10.3 mg/dL 8.7(L)  Total Protein 6.5 - 8.1 g/dL 7.3  Total Bilirubin 0.3 - 1.2 mg/dL 2.1(H)  Alkaline Phos 38 - 126 U/L 428(H)  AST 15 - 41 U/L 65(H)  ALT 17 - 63 U/L 280(H)   CBC Latest Ref Rng & Units 01/19/2017  WBC 3.8 - 10.6 K/uL 9.8  Hemoglobin 13.0 - 18.0 g/dL 14.0  Hematocrit 40.0 - 52.0 % 41.9  Platelets 150 - 440 K/uL 165    No images are attached to the encounter.  Ct Abdomen Wo Contrast  Result Date: 12/26/2016 CLINICAL DATA:  Esophageal cancer. Gastroesophageal reflux disease.  Dysphagia. Endoscopy from earlier today demonstrating esophageal stenosis at approximately 40 cm from the incisors. Rule out perforation of esophagus. EXAM: CT CHEST, ABDOMEN WITH CONTRAST TECHNIQUE: Multidetector CT imaging of the chest, abdomen was performed following the standard protocol during bolus administration of intravenous contrast. CONTRAST:  75 cc of Isovue-300 COMPARISON:  Esophagram of earlier today.  Prior CTs of 12/11/2016. FINDINGS: CT CHEST FINDINGS Cardiovascular: Aortic and  branch vessel atherosclerosis. Normal heart size, without pericardial effusion. Multivessel coronary artery atherosclerosis. A right-sided PICC line terminates at the high right atrium. Mediastinum/Nodes: No supraclavicular adenopathy. No mediastinal or definite hilar adenopathy, given limitations of unenhanced CT. Soft tissue fullness at the distal esophagus including on image 50/series 2. This is similar. The more proximal esophagus is dilated and contrast filled from today's esophagram. No extraluminal contrast identified. No pneumomediastinum. Lungs/Pleura: No pleural fluid. Right upper lobe subpleural calcified granuloma on image 65/series 4. 5 mm lingular nodule on image 97/series 4 is is similar to on the prior. Minimal subpleural left lower lobe nodularity at 3 mm on image 127/series 4. Not readily apparent on the prior, favored to represent a subpleural lymph node. Musculoskeletal: No acute osseous abnormality. CT ABDOMEN  FINDINGS Hepatobiliary: High left hepatic lobe subcentimeter low-density lesion is likely a cyst. Cholecystectomy, without biliary ductal dilatation. Pancreas: Pancreatic atrophy, without duct dilatation or dominant mass. Spleen: Old granulomatous disease in the spleen. Adrenals/Urinary Tract: Normal adrenal glands. No renal calculi or hydronephrosis. Stomach/Bowel: Normal appearance of the stomach. Normal abdominal bowel loops. Vascular/Lymphatic: Aortic and branch vessel atherosclerosis.  Gastrohepatic ligament adenopathy is again identified, including at 1.3 cm on image 53/series 2. Other:  No ascites.  No abdominal peritoneal or omental metastasis. Musculoskeletal: No acute osseous abnormality. IMPRESSION: 1. Soft tissue fullness in the distal esophagus, likely representing the primary site of malignancy. This is at least partially obstructive, with contrast from today's esophagram identified within a dilated more superior esophagus. 2. Isolated gastrohepatic ligament adenopathy, highly suspicious for metastatic disease. 3. No evidence of esophageal perforation. 4. Nonspecific lingular nodule, similar. 5. Coronary artery atherosclerosis. Aortic Atherosclerosis (ICD10-I70.0). Electronically Signed   By: Abigail Miyamoto M.D.   On: 12/26/2016 14:58   Ct Chest Wo Contrast  Result Date: 12/26/2016 CLINICAL DATA:  Esophageal cancer. Gastroesophageal reflux disease. Dysphagia. Endoscopy from earlier today demonstrating esophageal stenosis at approximately 40 cm from the incisors. Rule out perforation of esophagus. EXAM: CT CHEST, ABDOMEN WITH CONTRAST TECHNIQUE: Multidetector CT imaging of the chest, abdomen was performed following the standard protocol during bolus administration of intravenous contrast. CONTRAST:  75 cc of Isovue-300 COMPARISON:  Esophagram of earlier today.  Prior CTs of 12/11/2016. FINDINGS: CT CHEST FINDINGS Cardiovascular: Aortic and branch vessel atherosclerosis. Normal heart size, without pericardial effusion. Multivessel coronary artery atherosclerosis. A right-sided PICC line terminates at the high right atrium. Mediastinum/Nodes: No supraclavicular adenopathy. No mediastinal or definite hilar adenopathy, given limitations of unenhanced CT. Soft tissue fullness at the distal esophagus including on image 50/series 2. This is similar. The more proximal esophagus is dilated and contrast filled from today's esophagram. No extraluminal contrast identified. No pneumomediastinum.  Lungs/Pleura: No pleural fluid. Right upper lobe subpleural calcified granuloma on image 65/series 4. 5 mm lingular nodule on image 97/series 4 is is similar to on the prior. Minimal subpleural left lower lobe nodularity at 3 mm on image 127/series 4. Not readily apparent on the prior, favored to represent a subpleural lymph node. Musculoskeletal: No acute osseous abnormality. CT ABDOMEN  FINDINGS Hepatobiliary: High left hepatic lobe subcentimeter low-density lesion is likely a cyst. Cholecystectomy, without biliary ductal dilatation. Pancreas: Pancreatic atrophy, without duct dilatation or dominant mass. Spleen: Old granulomatous disease in the spleen. Adrenals/Urinary Tract: Normal adrenal glands. No renal calculi or hydronephrosis. Stomach/Bowel: Normal appearance of the stomach. Normal abdominal bowel loops. Vascular/Lymphatic: Aortic and branch vessel atherosclerosis. Gastrohepatic ligament adenopathy is again identified, including at 1.3 cm on image 53/series 2. Other:  No ascites.  No abdominal peritoneal or omental metastasis. Musculoskeletal: No acute osseous abnormality. IMPRESSION: 1. Soft tissue fullness in the distal esophagus, likely representing the primary site of malignancy. This is at least partially obstructive, with contrast from today's esophagram identified within a dilated more superior esophagus. 2. Isolated gastrohepatic ligament adenopathy, highly suspicious for metastatic disease. 3. No evidence of esophageal perforation. 4. Nonspecific lingular nodule, similar. 5. Coronary artery atherosclerosis. Aortic Atherosclerosis (ICD10-I70.0). Electronically Signed   By: Abigail Miyamoto M.D.   On: 12/26/2016 14:58   Nm Pet Image Initial (pi) Skull Base To Thigh  Result Date: 01/03/2017 CLINICAL DATA:  Initial treatment strategy for esophageal cancer. EXAM: NUCLEAR MEDICINE PET SKULL BASE TO THIGH TECHNIQUE: Twelve point for mCi F-18 FDG was injected intravenously. Full-ring PET imaging was  performed from the skull base to thigh after the radiotracer. CT data was obtained and used for attenuation correction and anatomic localization. FASTING BLOOD GLUCOSE:  Value: 127 mg/dl COMPARISON:  CT chest abdomen pelvis 12/26/2016. FINDINGS: NECK: No hypermetabolic lymph nodes in the neck. CT images show no acute findings. CHEST: No hypermetabolic mediastinal, hilar or axillary lymph nodes. Distal esophageal mass measures approximately 2.5 x 2.7 cm with an SUV max of 8.0. No adjacent hypermetabolic adenopathy. No hypermetabolic pulmonary nodules. Atherosclerotic calcification of the arterial vasculature, including coronary arteries. Right PICC tip terminates at the SVC RA junction. Heart is mildly enlarged. No pericardial or pleural effusion. Esophagus is dilated throughout its course, to the level of the above-described esophageal mass. ABDOMEN/PELVIS: No abnormal hypermetabolism in the liver, adrenal glands, spleen or pancreas. No hypermetabolic lymph nodes. Subcentimeter low-attenuation lesion in the dome of the liver is too small to characterize. Cholecystectomy. Adrenal glands, kidneys, spleen, pancreas, stomach and bowel are grossly unremarkable. Atherosclerotic calcification of the arterial vasculature without aneurysm. Prostate is mildly enlarged. No free fluid. SKELETON: No abnormal osseous hypermetabolism. IMPRESSION: 1. Hypermetabolic distal esophageal mass with associated esophageal dilatation. No hypermetabolic adenopathy or distant metastatic disease. 2. Aortic atherosclerosis (ICD10-170.0). Coronary artery calcification. Electronically Signed   By: Lorin Picket M.D.   On: 01/03/2017 14:44   Dg C-arm 1-60 Min-no Report  Result Date: 12/26/2016 Fluoroscopy was utilized by the requesting physician.  No radiographic interpretation.   Ir Fluoro Guide Port Insertion Right  Result Date: 01/10/2017 CLINICAL DATA:  Esophageal carcinoma EXAM: RIGHT INTERNAL JUGULAR SINGLE LUMEN POWER PORT  CATHETER INSERTION Date:  10/31/201810/31/2018 3:46 pm Radiologist:  M. Daryll Brod, MD Guidance:  Ultrasound and fluoroscopic MEDICATIONS: Ancef 2 g; The antibiotic was administered within an appropriate time interval prior to skin puncture. ANESTHESIA/SEDATION: Versed 2.0 mg IV; Fentanyl 50 mcg IV; Moderate Sedation Time:  27 minutes The patient was continuously monitored during the procedure by the interventional radiology nurse under my direct supervision. FLUOROSCOPY TIME:  One minutes, 12 seconds (30 mGy) COMPLICATIONS: None immediate. CONTRAST:  None. PROCEDURE: Informed consent was obtained from the patient following explanation of the procedure, risks, benefits and alternatives. The patient understands, agrees and consents for the procedure. All questions were addressed. A time out was performed. Maximal barrier sterile technique utilized including caps, mask, sterile gowns, sterile gloves, large sterile drape, hand hygiene, and 2% chlorhexidine scrub. Under sterile conditions and local anesthesia, right internal jugular micropuncture venous access was performed. Access was performed with ultrasound. Images were obtained for documentation. A guide wire was inserted followed by a transitional dilator. This allowed insertion of a guide wire and catheter into the IVC. Measurements were obtained from the SVC /  RA junction back to the right IJ venotomy site. In the right infraclavicular chest, a subcutaneous pocket was created over the second anterior rib. This was done under sterile conditions and local anesthesia. 1% lidocaine with epinephrine was utilized for this. A 2.5 cm incision was made in the skin. Blunt dissection was performed to create a subcutaneous pocket over the right pectoralis major muscle. The pocket was flushed with saline vigorously. There was adequate hemostasis. The port catheter was assembled and checked for leakage. The port catheter was secured in the pocket with two retention sutures.  The tubing was tunneled subcutaneously to the right venotomy site and inserted into the SVC/RA junction through a valved peel-away sheath. Position was confirmed with fluoroscopy. Images were obtained for documentation. The patient tolerated the procedure well. No immediate complications. Incisions were closed in a two layer fashion with 4 - 0 Vicryl suture. Dermabond was applied to the skin. The port catheter was accessed, blood was aspirated followed by saline and heparin flushes. Needle was removed. A dry sterile dressing was applied. IMPRESSION: Ultrasound and fluoroscopically guided right internal jugular single lumen power port catheter insertion. Tip in the SVC/RA junction. Catheter ready for use. Electronically Signed   By: Jerilynn Mages.  Shick M.D.   On: 01/10/2017 15:56   Dg Esophagus W/water Sol Cm  Result Date: 12/26/2016 CLINICAL DATA:  Recent esophageal instrumentation. Pain. Evaluate for esophageal rupture. EXAM: ESOPHOGRAM/BARIUM SWALLOW TECHNIQUE: Single contrast examination was performed using water-soluble contrast. FLUOROSCOPY TIME:  1 minutes and 6 seconds COMPARISON:  None. FINDINGS: The patient ingested water-soluble contrast and imaging was obtained. There is an obstruction of the distal esophagus, just above the gastroesophageal junction. A fluid level exists in the esophagus. Contrast never traversed beyond the distal esophagus. The patient vomited. There is no evidence of extravasation to suggest leak or rupture. IMPRESSION: No evidence of contrast extravasation to suggest esophageal injury or perforation There is abrupt obstruction of the distal esophagus just above the gastroesophageal junction. Electronically Signed   By: Marybelle Killings M.D.   On: 12/26/2016 14:46     Assessment and plan- Patient is a 76 y.o. male  non metastatic esophageal cancer [id number] [facility]  1. Abnormal LFT's- I suspect this is secondary to TPN. His LFT's were normal a month ago. He is otherwise healthy with no medical  problems or alcohol intake in the past. I will oredr hepatitis labs, GGT and fractionated bilirubin as requested by Dr. Vicente Males. We have also moved up his [place] to 01/22/17. Continue to monitor. He will start his J tube feeds today and come off TPN in a weeks time and hopefully that should help bring down his LFT's  2. Esophageal cancer- Patient has started his RT yesterday and it would be imperative to give concurrent chemotherapy for optimal results especially since it is undecided if patient will proceed with surgery or not. I will therefore proceed with cycle 1 of caro/taxolo today and repeat LFT's next week  3. Nutrition- he will hopefully start J tube feeds today and come off TPN soon. Appreciate input from [employer]  4. Chemo induced nausea vomiting- he has been instructed how to use J tube for his meds if need be. otherwise he will use zofran ODT for nausea  5. Symptoms of spitting up that he has been having is likely due to almost complete obstruction of his esophagus which should improve with chemo/RT.    Visit Diagnosis 1. Malignant neoplasm of lower third of esophagus (HCC)   2.  Encounter for antineoplastic chemotherapy   3. Abnormal LFTs      Dr. Randa Evens, MD, MPH [facility] Pager- [phone] 01/19/2017 12:04 PM

## 2017-01-20 LAB — HEPATITIS B SURFACE ANTIBODY, QUANTITATIVE: Hepatitis B-Post: <3.1 m[IU]/mL — ABNORMAL LOW (ref >9.9)

## 2017-01-20 LAB — CEA: CEA: 2.9 ng/mL (ref 0.0–4.7)

## 2017-01-20 LAB — HEPATITIS C ANTIBODY

## 2017-01-22 ENCOUNTER — Ambulatory Visit
Admission: RE | Admit: 2017-01-22 | Discharge: 2017-01-22 | Disposition: A | Discharge status: Home / Self Care | Payer: Medicare Other | Source: Ambulatory Visit | Attending: Radiation Oncology | Admitting: Radiation Oncology

## 2017-01-22 ENCOUNTER — Ambulatory Visit: Payer: Medicare Other

## 2017-01-22 ENCOUNTER — Inpatient Hospital Stay: Payer: Medicare Other

## 2017-01-22 ENCOUNTER — Ambulatory Visit
Admission: RE | Admit: 2017-01-22 | Discharge: 2017-01-22 | Disposition: A | Discharge status: Home / Self Care | Payer: Medicare Other | Source: Ambulatory Visit | Attending: Gastroenterology | Admitting: Gastroenterology

## 2017-01-22 DIAGNOSIS — C155 Malignant neoplasm of lower third of esophagus: Secondary | ICD-10-CM | POA: Diagnosis not present

## 2017-01-22 DIAGNOSIS — K7689 Other specified diseases of liver: Secondary | ICD-10-CM | POA: Insufficient documentation

## 2017-01-22 NOTE — Progress Notes (Signed)
Nutrition Follow-up:  Patient with squamous cell esophageal cancer.  Patient receiving chemotherapy and radiation therapy.  J-tube was placed on 11/6.    Met with patient and wife in clinic this am following radiation.  Patient reports that tube feeding supplies were not delivered until Saturday at 8:30pm so delayed starting tube feeding.  Patient has been tolerate osmolite 1.5 at 38m/hr with 658mwater flush QID.  Patient TPN continues at goal rate.  Patient reports no issues with tolerate tube feeding, passing gas, feels like he could have a bowel movement today but has not tried to go to the bathroom yet this am.  Reports has not had a bowel movement in week.  No abdominal pain.  No worse nausea than normally has per patient.    Patient unable to get doppler of liver this am but will be getting at 1pm today.    Patient reports that he feels comfortable setting up tube feeding and giving water flushes and medications via tube if needed.  Still needs help changing J-tube dressing.  Patient reports urinating without problems.    Medications: reviewed  Labs: reviewed, noted elevated LFTs  Anthropometrics:   Noted weight on 11/9 182 lb increased from 180 lb on 10/25.   Estimated Energy Needs  Kcals: 2050-2460 calories/d Protein: 102-123 g/d Fluid: 2.4 L/d  NUTRITION DIAGNOSIS: Inadequate oral intake continues but being addressed with TPN and tube feeding   MALNUTRITION DIAGNOSIS: continue to monitor   INTERVENTION:   Discussed with patient today he will increase tube feeding to 4068mr with 120m50mter flush QID and decrease TPN to half goal rate (40ml57m.   If tolerating tube feeding today, patient will increase tube feeding to 60ml/33mith 180ml w46m flush QID and STOP TPN on Tuesday.  On Wednesday, 11/14 patient will increase tube feeding to 86ml/hr44m 18 hours and provide 240ml fre86mter flush QID.   Discussed plan with Dr. Rao and iJanese Banksgreement with tube feeding and TPN  titration.   Discussed with patient that he can use water flushes if needs to take medications via tube as well.   Coordination of care completed between, Dr. Rao, BradTarri Fulleranced Fennvilletative and Sherry RNJudeen Hammansrders for J-tube care/teaching and order to discontinue TPN.   Contact information provided and patient knows to contact me with questions or concerns.    MONITORING, EVALUATION, GOAL: weight trends, tube feeding tolerance, TPN   NEXT VISIT: Thursday, November 15 following radiation  Railey Glad B. Mariah Harn, RDZenia Resides RAndalusiaisLeoned Dietitian [phone]

## 2017-01-23 ENCOUNTER — Ambulatory Visit
Admission: RE | Admit: 2017-01-23 | Discharge: 2017-01-23 | Disposition: A | Discharge status: Home / Self Care | Payer: Medicare Other | Source: Ambulatory Visit | Attending: Radiation Oncology | Admitting: Radiation Oncology

## 2017-01-23 ENCOUNTER — Telehealth: Payer: Self-pay

## 2017-01-23 ENCOUNTER — Telehealth: Payer: Self-pay | Admitting: *Deleted

## 2017-01-23 ENCOUNTER — Ambulatory Visit: Payer: Medicare Other

## 2017-01-23 DIAGNOSIS — C155 Malignant neoplasm of lower third of esophagus: Secondary | ICD-10-CM | POA: Diagnosis not present

## 2017-01-23 NOTE — Telephone Encounter (Signed)
Called pt to speak to him about bowels.  Joli had sent me a message about his bowels. I did speak to dr Janese Banks and she suggested to tyr miralax through j tube. I called pt and he said he had about 20 min worth of cramping pains but then it went away and he did have small BM yest. He states he just came off the tpn at 2 m and he is not on full dose of tube feeding and he wants to wait another day before he gets into miralax.  He is taking my information under advisement.  I told him if he uses it he would need to mix in 2 syringes of water and I would rec; half a dose and then adjust.  Everyone is different as to how much you need and how fast it works.  Patient is not feeling uncomfortable and he will give it one more day and see how it goes.

## 2017-01-23 NOTE — Telephone Encounter (Signed)
Nutrition Follow-up:  Spoke with patient via phone this pm as RD at another clinic location today.  Patient reports that he had a bad night. Reports abdominal pain during the night that hurt from rib cage down to belly button and woke him up during the night.  Reports that this am and after going to radiation he is feeling better.  Reports that he is passing gas, had a smear of stool on tissue yesterday but no other bowel movement today.  Reports some nausea but feels better once he gets that phelgm out.    Reports that tube feeding is at 62ml/hr for 18 hours today and TPN is set at 39ml/hr today ([place] was able to set pump for patient).    Patient reports that he is passing plenty of urine but is dark in color "copper" color.    Medications: reviewed  Labs: reviewed  Anthropometrics:   No new weight    Estimated Energy Needs  Kcals: 2050-2460 calories/d Protein: 102-123 g/d Fluid: 2.4 L/d  NUTRITION DIAGNOSIS: Inadequate oral intake continues but being addressed with tube feeding   MALNUTRITION DIAGNOSIS: continue to monitor   INTERVENTION:  Patient will continue to current tube feeding and TPN regimen for today and increase tube feeding to 77ml/hr tomorrow for 18 hours and stop TPN tomorrow.   Message sent to Dr Janese Banks regarding possible bowel regimen to help promote bowel movement and information regarding color of patient's urine.    MONITORING, EVALUATION, GOAL: weight trends, tube feeding tolerance   NEXT VISIT: Thursday, November 15 after radiation  Lilybeth Vien B. Zenia Resides, [place], [place] Registered Dietitian [phone] (pager)

## 2017-01-24 ENCOUNTER — Ambulatory Visit: Payer: Medicare Other

## 2017-01-24 ENCOUNTER — Ambulatory Visit
Admission: RE | Admit: 2017-01-24 | Discharge: 2017-01-24 | Disposition: A | Discharge status: Home / Self Care | Payer: Medicare Other | Source: Ambulatory Visit | Attending: Radiation Oncology | Admitting: Radiation Oncology

## 2017-01-24 DIAGNOSIS — C155 Malignant neoplasm of lower third of esophagus: Secondary | ICD-10-CM | POA: Diagnosis not present

## 2017-01-25 ENCOUNTER — Ambulatory Visit: Payer: Medicare Other

## 2017-01-25 ENCOUNTER — Ambulatory Visit
Admission: RE | Admit: 2017-01-25 | Discharge: 2017-01-25 | Disposition: A | Discharge status: Home / Self Care | Payer: Medicare Other | Source: Ambulatory Visit | Attending: Radiation Oncology | Admitting: Radiation Oncology

## 2017-01-25 ENCOUNTER — Telehealth: Payer: Self-pay | Admitting: *Deleted

## 2017-01-25 DIAGNOSIS — C155 Malignant neoplasm of lower third of esophagus: Secondary | ICD-10-CM | POA: Diagnosis not present

## 2017-01-25 MED ORDER — DIPHENOXYLATE-ATROPINE 2.5-0.025 MG PO TABS: 1.0000 | ORAL_TABLET | Freq: Four times a day (QID) | ORAL | 0 refills | Status: DC | PRN

## 2017-01-25 MED ORDER — VITAL 1.5 CAL PO LIQD: ORAL | 12 refills | Status: DC

## 2017-01-25 NOTE — Telephone Encounter (Signed)
Pt came in today and spoke to [facility] that pt was having diarrhea since the increase in tube feeding. Joli and I feel that the increase of amount of tube feeding and the fact he has not had food

## 2017-01-25 NOTE — Progress Notes (Signed)
Nutrition Follow-up:  Patient with squamous cell esophageal cancer.  Patient receiving chemotherapy and radiation therapy.  J-tube placed on 11/6.  Met with patient and wife in clinic today following radiation.  Patient reports had a miserable night due to excessive diarrhea (watery) about every hour after starting tube feeding yesterday at 2pm.  Patient reports had a bowel movement (not solid but loose earlier in the day before starting tube feeding) and then later after increasing rate of tube feeding to 60m/hr diarrhea started.  Patient reports that he had not taking any of the miralax or senna prior to bowel movement yesterday.  Patient reports last bowel movement over 1 week ago.  Patient reports nausea is the same as before. Patient reports did not notice any increase abdominal pain, diarrhea with water flush.   Patient is off TPN.    Medications: reviewed  Labs: reviewed  Anthropometrics:   No new weight today. Last weight on 11/9 of 182 lb.    Estimated Energy Needs  Kcals: 2050-2460 calories/d Protein: 102-123 g/d Fluid: 2.4 L/d  NUTRITION DIAGNOSIS: Inadequate oral intake continues but nutrition being addressed with tube feeding   MALNUTRITION DIAGNOSIS: continue to monitor   INTERVENTION:   Recommend decreasing rate of tube feeding to 788mhr for 22 hours vs 18 hours at 8616mr and starting vital 1.5 (peptide base formula for malabsorption, maldigestion) as patient with excessive diarrhea (up every hour per patient). Patient has been on TPN since 12/06/16 as unable to take anything by mouth due to esophageal mass. GI tract has not been utilized due to patient being on TPN.   Patient will need to flush with 240 ml before starting tube feeding and after stopping tube feeding.  Will need to provide additional 240m62m water 2 times per day during waking hours.  Patient has also been  instructed on adequate water with medications giving via J-tube.  Tube feeding regimen with  vital 1.5 will provide 2310 calories, 104 g of protein and 2130ml78me water (including water flush and free water in formula).   Tube feeding rate/recommendations will be adjusted based on patient tolerance.  Spoke with RN, SherrJudeen Hammansshe will speak with Dr. Rao rJanese Banksrding medication management for diarrhea.   Provided coordination of care between AdvanPea RidgeAdvanAguilaJThersa SaltPatient has contact information as well as Advan[place]act information.    MONITORING, EVALUATION, GOAL: weight trends, tube feeding tolerance   NEXT VISIT: phone call tomorrow as RD will not be in clinic during patient appointment time  Monday, November 19 in clinic.   Anush Wiedeman B. AllenZenia Resides LIda RUlmstered Dietitian [phone])

## 2017-01-26 ENCOUNTER — Inpatient Hospital Stay (HOSPITAL_BASED_OUTPATIENT_CLINIC_OR_DEPARTMENT_OTHER): Payer: Medicare Other | Admitting: Oncology

## 2017-01-26 ENCOUNTER — Inpatient Hospital Stay: Payer: Medicare Other

## 2017-01-26 ENCOUNTER — Other Ambulatory Visit: Payer: Self-pay

## 2017-01-26 ENCOUNTER — Encounter: Payer: Self-pay | Admitting: Oncology

## 2017-01-26 ENCOUNTER — Ambulatory Visit: Payer: Medicare Other

## 2017-01-26 ENCOUNTER — Telehealth: Payer: Self-pay

## 2017-01-26 ENCOUNTER — Ambulatory Visit
Admission: RE | Admit: 2017-01-26 | Discharge: 2017-01-26 | Disposition: A | Discharge status: Home / Self Care | Payer: Medicare Other | Source: Ambulatory Visit | Attending: Radiation Oncology | Admitting: Radiation Oncology

## 2017-01-26 VITALS — BP 126/79 | HR 98 | Temp 97.6°F | Resp 18 | Wt 182.8 lb

## 2017-01-26 DIAGNOSIS — Z79899 Other long term (current) drug therapy: Secondary | ICD-10-CM

## 2017-01-26 DIAGNOSIS — C155 Malignant neoplasm of lower third of esophagus: Secondary | ICD-10-CM

## 2017-01-26 DIAGNOSIS — R7989 Other specified abnormal findings of blood chemistry: Secondary | ICD-10-CM | POA: Diagnosis not present

## 2017-01-26 DIAGNOSIS — R599 Enlarged lymph nodes, unspecified: Secondary | ICD-10-CM | POA: Diagnosis not present

## 2017-01-26 DIAGNOSIS — R112 Nausea with vomiting, unspecified: Secondary | ICD-10-CM

## 2017-01-26 DIAGNOSIS — R945 Abnormal results of liver function studies: Secondary | ICD-10-CM

## 2017-01-26 DIAGNOSIS — Z5111 Encounter for antineoplastic chemotherapy: Secondary | ICD-10-CM | POA: Diagnosis not present

## 2017-01-26 DIAGNOSIS — R5383 Other fatigue: Secondary | ICD-10-CM

## 2017-01-26 LAB — COMPREHENSIVE METABOLIC PANEL
ALBUMIN: 3.2 g/dL — AB (ref 3.5–5.0)
ALK PHOS: 261 U/L — AB (ref 38–126)
ALT: 337 U/L — AB (ref 17–63)
AST: 70 U/L — ABNORMAL HIGH (ref 15–41)
Anion gap: 8 (ref 5–15)
BUN: 25 mg/dL — ABNORMAL HIGH (ref 6–20)
CALCIUM: 8.1 mg/dL — AB (ref 8.9–10.3)
CO2: 26 mmol/L (ref 22–32)
CREATININE: 0.95 mg/dL (ref 0.61–1.24)
Chloride: 102 mmol/L (ref 101–111)
GFR calc Af Amer: >60 mL/min (ref >60)
GFR calc non Af Amer: >60 mL/min (ref >60)
GLUCOSE: 106 mg/dL — AB (ref 65–99)
Potassium: 3.9 mmol/L (ref 3.5–5.1)
SODIUM: 136 mmol/L (ref 135–145)
Total Bilirubin: 1.3 mg/dL — ABNORMAL HIGH (ref 0.3–1.2)
Total Protein: 6.9 g/dL (ref 6.5–8.1)

## 2017-01-26 LAB — CBC WITH DIFFERENTIAL/PLATELET
BASOS PCT: 1 %
Basophils Absolute: 0.1 10*3/uL (ref 0–0.1)
EOS ABS: 0.1 10*3/uL (ref 0–0.7)
Eosinophils Relative: 2 %
HEMATOCRIT: 38.9 % — AB (ref 40.0–52.0)
Hemoglobin: 13 g/dL (ref 13.0–18.0)
LYMPHS ABS: 1.1 10*3/uL (ref 1.0–3.6)
Lymphocytes Relative: 19 %
MCH: 30.4 pg (ref 26.0–34.0)
MCHC: 33.5 g/dL (ref 32.0–36.0)
MCV: 90.5 fL (ref 80.0–100.0)
MONO ABS: 0.3 10*3/uL (ref 0.2–1.0)
MONOS PCT: 5 %
NEUTROS ABS: 4.4 10*3/uL (ref 1.4–6.5)
Neutrophils Relative %: 73 %
Platelets: 203 10*3/uL (ref 150–440)
RBC: 4.3 MIL/uL — ABNORMAL LOW (ref 4.40–5.90)
RDW: 14.2 % (ref 11.5–14.5)
WBC: 6 10*3/uL (ref 3.8–10.6)

## 2017-01-26 MED ORDER — PALONOSETRON HCL INJECTION 0.25 MG/5ML
0.2500 mg | Freq: Once | INTRAVENOUS | Status: AC
  Administered 2017-01-26: 0.25 mg via INTRAVENOUS
  Filled 2017-01-26: qty 5

## 2017-01-26 MED ORDER — DIPHENHYDRAMINE HCL 50 MG/ML IJ SOLN
50.0000 mg | Freq: Once | INTRAMUSCULAR | Status: AC
  Administered 2017-01-26: 25 mg via INTRAVENOUS
  Filled 2017-01-26: qty 1

## 2017-01-26 MED ORDER — SODIUM CHLORIDE 0.9% FLUSH
10.0000 mL | INTRAVENOUS | Status: DC | PRN
  Filled 2017-01-26: qty 10

## 2017-01-26 MED ORDER — FAMOTIDINE IN NACL 20-0.9 MG/50ML-% IV SOLN
20.0000 mg | Freq: Once | INTRAVENOUS | Status: AC
  Administered 2017-01-26: 20 mg via INTRAVENOUS
  Filled 2017-01-26: qty 50

## 2017-01-26 MED ORDER — SODIUM CHLORIDE 0.9 % IV SOLN
Freq: Once | INTRAVENOUS | Status: AC
  Administered 2017-01-26: 10:00:00 via INTRAVENOUS
  Filled 2017-01-26: qty 1000

## 2017-01-26 MED ORDER — SODIUM CHLORIDE 0.9 % IV SOLN
20.0000 mg | Freq: Once | INTRAVENOUS | Status: AC
  Administered 2017-01-26: 20 mg via INTRAVENOUS
  Filled 2017-01-26: qty 2

## 2017-01-26 MED ORDER — HEPARIN SOD (PORK) LOCK FLUSH 100 UNIT/ML IV SOLN
500.0000 [IU] | Freq: Once | INTRAVENOUS | Status: AC | PRN
  Administered 2017-01-26: 500 [IU]
  Filled 2017-01-26: qty 5

## 2017-01-26 MED ORDER — PACLITAXEL CHEMO INJECTION 300 MG/50ML
50.0000 mg/m2 | Freq: Once | INTRAVENOUS | Status: AC
  Administered 2017-01-26: 102 mg via INTRAVENOUS
  Filled 2017-01-26: qty 17

## 2017-01-26 MED ORDER — SODIUM CHLORIDE 0.9 % IV SOLN
195.0000 mg | Freq: Once | INTRAVENOUS | Status: AC
  Administered 2017-01-26: 200 mg via INTRAVENOUS
  Filled 2017-01-26: qty 20

## 2017-01-26 NOTE — Progress Notes (Signed)
Hematology/Oncology Consult note [facility]  [phone] [phone]  Patient Care Team: Idelle Crouch, MD as PCP - General (Internal Medicine) Clent Jacks, RN as Registered Nurse Grace Isaac, MD as Consulting Physician (Cardiothoracic Surgery) Sindy Guadeloupe, MD as Consulting Physician (Oncology) Noreene Filbert, MD as Referring Physician (Radiation Oncology)   Name of the patient: Casey Acevedo  [id number]  1940-06-23   Date of visit: 01/26/17  Diagnosis-non metastatic esophageal cancer [id number] [facility]  Chief complaint/ Reason for visit-on treatment assessment prior to cycle 2 of carbo/ taxol  Heme/Onc history:1. Patient is a 76 year old gentleman with no significant past medical history and takes no medications he has a remote history of smokingduring his college days and quit smoking thereafter. No history of any alcohol intake. He was recently admitted to the hospital on 12/06/2016 with symptoms of progressive dysphagia which he states has been going on since the starting of September. His dysphagia got to the point that he began to have pain even on swallowing icecream. He underwent EGD at that time which showed but high 2 cm long stricture 2 cm above the GE junction. Biopsies at that time were negative for malignancy. Given that he was unable to eat he was started on TPN at that time and plan was to get a repeat EGD with possible biopsy  2. Patient underwent repeat EGD on 12/26/2016 where he was again noted to have a lower esophageal stricture which reduced the lumen to less than 3-4 mm. There was no evidence of Barrett's esophagus. 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 Shortly after the stricture was dilated the stricture did close up significantly. Postprocedure patient complained of abdominal pain and there was a  concern for perforation and hence a repeat CT abdomen was obtained which did not show any evidence of perforation  3. CT abdomen pelvis as well as CT chest with contrast on 10/01/2018showed: 7 mm sub pleural nodule in the lingula. No evidence of axillary mediastinal or hilar adenopathy.1.3 cm soft tissue nodule adjacent to the GE junctionwhich is favored to represent an enlarged lymph node potentially active or metastatic in etiology. Narrowing involving thickening of the distal esophagus compatible with known distal esophageal stricture  4. Patient has been referred to [place] for further management. He is tolerating his TPN well and otherwise reports no complaints. He lives with his wife and is independent of his ADLs and IADLs. Besides dysphagia patient reports no loss of appetite or unintentional weight loss over the last few months. He denies any pain  5.[place] been deemed to be a potential surgical candidate in the future. Plan for now is to proceed with concurrent chemoradiation followed by EUS upon completion of treatment given difficulty with the  second EGD  6. Chemo/RT started on 01/19/17    Interval history-reports feeling fatigued and nauseous.  Zofran 8 mg has been helping.  Also had diarrhea yesterday but Lomotil helped immensely.  ECOG PS- 1 Pain scale- 0  Review of systems- Review of Systems  Constitutional: Positive for malaise/fatigue. Negative for chills, fever and weight loss.  HENT: Negative for congestion, ear discharge and nosebleeds.   Eyes: Negative for blurred vision.  Respiratory: Negative for cough, hemoptysis, sputum production, shortness of breath and wheezing.   Cardiovascular: Negative for chest pain, palpitations, orthopnea and claudication.  Gastrointestinal: Positive for nausea. Negative for abdominal pain, blood in stool, constipation, diarrhea, heartburn, melena and vomiting.    Genitourinary: Negative for dysuria, flank pain, frequency, hematuria and urgency.  Musculoskeletal: Negative for back pain, joint pain and myalgias.  Skin: Negative for rash.  Neurological: Negative for dizziness, tingling, focal weakness, seizures, weakness and headaches.  Endo/Heme/Allergies: Does not bruise/bleed easily.  Psychiatric/Behavioral: Negative for depression and suicidal ideas. The patient does not have insomnia.       No Known Allergies   Past Medical History:  Diagnosis Date  . Cancer (HCC)    esophageal  . Dysphagia   . GERD (gastroesophageal reflux disease)      Past Surgical History:  Procedure Laterality Date  . CHOLECYSTECTOMY    . ESOPHAGOGASTRODUODENOSCOPY (EGD) WITH PROPOFOL N/A 12/07/2016   Procedure: ESOPHAGOGASTRODUODENOSCOPY (EGD) WITH PROPOFOL;  Surgeon: Jonathon Bellows, MD;  Location: [facility] ENDOSCOPY;  Service: Gastroenterology;  Laterality: N/A;  . ESOPHAGOGASTRODUODENOSCOPY (EGD) WITH PROPOFOL N/A 12/26/2016   Procedure: ESOPHAGOGASTRODUODENOSCOPY (EGD) WITH PROPOFOL WITH DILATION;  Surgeon: Jonathon Bellows, MD;  Location: [facility] ENDOSCOPY;  Service: Gastroenterology;  Laterality: N/A;  . EYE SURGERY    . GASTROJEJUNOSTOMY N/A 01/16/2017   Procedure: LAPROSCOPIC ASSIST FEEDING JEJUNOSTOMY TUBE;  Surgeon: Johnathan Hausen, MD;  Location: [facility] ORS;  Service: General;  Laterality: N/A;  . IR FLUORO GUIDE PORT INSERTION RIGHT  01/10/2017  . PICC LINE INSERTION Right     Social History   Socioeconomic History  . Marital status: Married    Spouse name: Not on file  . Number of children: Not on file  . Years of education: Not on file  . Highest education level: Not on file  Social Needs  . Financial resource strain: Not on file  . Food insecurity - worry: Not on file  . Food insecurity - inability: Not on file  . Transportation needs - medical: Not on file  . Transportation needs - non-medical: Not on file  Occupational History  . Not on file  Tobacco  Use  . Smoking status: Never Smoker  . Smokeless tobacco: Never Used  Substance and Sexual Activity  . Alcohol use: No  . Drug use: No  . Sexual activity: Not Currently  Other Topics Concern  . Not on file  Social History Narrative   Independent at baseline. Ambulatory    Family History  Problem Relation Age of Onset  . Heart failure Father   . Prostate cancer Neg Hx   . Bladder Cancer Neg Hx   . Kidney cancer Neg Hx      Current Outpatient Medications:  .  dexamethasone 0.5 MG/5ML elixir, 80 mLs (8 mg total) by Per J Tube route daily. Start the day after chemotherapy and take for 2 days (after every chemotherapy treatment), Disp: 320 mL, Rfl: 3 .  diphenoxylate-atropine (LOMOTIL) 2.5-0.025 MG tablet, Take 1 tablet 4 (four) times daily as needed by mouth for diarrhea or loose stools., Disp: 120 tablet, Rfl: 0 .  lidocaine-prilocaine (EMLA) cream, Apply to affected area once, Disp: 30 g, Rfl: 3 .  LORazepam (ATIVAN) 0.5 MG tablet, Take 1 tablet (0.5 mg total) by mouth every 6 (six) hours as needed (Nausea or vomiting)., Disp: 30 tablet, Rfl: 0 .  Nutritional Supplements (FEEDING SUPPLEMENT, VITAL 1.5 CAL,) LIQD, Give vital 1.5 via continuous pump at 74ml/hr for 22 hours.  Flush with 236ml of water before starting tube feeding and after stopping tube feeding.  Provide additional 228ml of water 2 other times during waking  hours via J tube., Disp: 1540 mL, Rfl: 12 .  ondansetron (ZOFRAN ODT) 4 MG disintegrating tablet, Take 1 tablet (4 mg total) by mouth every 8 (eight) hours as needed for nausea or vomiting., Disp: 30 tablet, Rfl: 0 .  ondansetron (ZOFRAN) 8 MG tablet, Take 1 tablet (8 mg total) by mouth 2 (two) times daily as needed for refractory nausea / vomiting. Start on day 3 after chemo., Disp: 30 tablet, Rfl: 1 .  prochlorperazine (COMPAZINE) 10 MG tablet, Take 1 tablet (10 mg total) by mouth every 6 (six) hours as needed (Nausea or vomiting)., Disp: 30 tablet, Rfl: 1  Physical  exam:  Vitals:   01/26/17 0857  BP: 126/79  Pulse: 98  Resp: 18  Temp: 97.6 F (36.4 C)  TempSrc: Tympanic  Weight: 182 lb 12.8 oz (82.9 kg)   Physical Exam  Constitutional: He is oriented to person, place, and time and well-developed, well-nourished, and in no distress.  HENT:  Head: Normocephalic and atraumatic.  Eyes: EOM are normal. Pupils are equal, round, and reactive to light.  Neck: Normal range of motion.  Cardiovascular: Normal rate, regular rhythm and normal heart sounds.  Pulmonary/Chest: Effort normal and breath sounds normal.  Abdominal: Soft. Bowel sounds are normal.  J tube in place  Neurological: He is alert and oriented to person, place, and time.  Skin: Skin is warm and dry.     CMP Latest Ref Rng & Units 01/26/2017  Glucose 65 - 99 mg/dL 106(H)  BUN 6 - 20 mg/dL 25(H)  Creatinine 0.61 - 1.24 mg/dL 0.95  Sodium 135 - 145 mmol/L 136  Potassium 3.5 - 5.1 mmol/L 3.9  Chloride 101 - 111 mmol/L 102  CO2 22 - 32 mmol/L 26  Calcium 8.9 - 10.3 mg/dL 8.1(L)  Total Protein 6.5 - 8.1 g/dL 6.9  Total Bilirubin 0.3 - 1.2 mg/dL 1.3(H)  Alkaline Phos 38 - 126 U/L 261(H)  AST 15 - 41 U/L 70(H)  ALT 17 - 63 U/L 337(H)   CBC Latest Ref Rng & Units 01/26/2017  WBC 3.8 - 10.6 K/uL 6.0  Hemoglobin 13.0 - 18.0 g/dL 13.0  Hematocrit 40.0 - 52.0 % 38.9(L)  Platelets 150 - 440 K/uL 203    No images are attached to the encounter.  Nm Pet Image Initial (pi) Skull Base To Thigh  Result Date: 01/03/2017 CLINICAL DATA:  Initial treatment strategy for esophageal cancer. EXAM: NUCLEAR MEDICINE PET SKULL BASE TO THIGH TECHNIQUE: Twelve point for mCi F-18 FDG was injected intravenously. Full-ring PET imaging was performed from the skull base to thigh after the radiotracer. CT data was obtained and used for attenuation correction and anatomic localization. FASTING BLOOD GLUCOSE:  Value: 127 mg/dl COMPARISON:  CT chest abdomen pelvis 12/26/2016. FINDINGS: NECK: No hypermetabolic  lymph nodes in the neck. CT images show no acute findings. CHEST: No hypermetabolic mediastinal, hilar or axillary lymph nodes. Distal esophageal mass measures approximately 2.5 x 2.7 cm with an SUV max of 8.0. No adjacent hypermetabolic adenopathy. No hypermetabolic pulmonary nodules. Atherosclerotic calcification of the arterial vasculature, including coronary arteries. Right PICC tip terminates at the SVC RA junction. Heart is mildly enlarged. No pericardial or pleural effusion. Esophagus is dilated throughout its course, to the level of the above-described esophageal mass. ABDOMEN/PELVIS: No abnormal hypermetabolism in the liver, adrenal glands, spleen or pancreas. No hypermetabolic lymph nodes. Subcentimeter low-attenuation lesion in the dome of the liver is too small to characterize. Cholecystectomy. Adrenal glands, kidneys, spleen, pancreas, stomach and bowel  are grossly unremarkable. Atherosclerotic calcification of the arterial vasculature without aneurysm. Prostate is mildly enlarged. No free fluid. SKELETON: No abnormal osseous hypermetabolism. IMPRESSION: 1. Hypermetabolic distal esophageal mass with associated esophageal dilatation. No hypermetabolic adenopathy or distant metastatic disease. 2. Aortic atherosclerosis (ICD10-170.0). Coronary artery calcification. Electronically Signed   By: Lorin Picket M.D.   On: 01/03/2017 14:44   US Liver Doppler  Result Date: 01/23/2017 CLINICAL DATA:  Esophageal cancer EXAM: DUPLEX ULTRASOUND OF LIVER TECHNIQUE: Color and duplex Doppler ultrasound was performed to evaluate the hepatic in-flow and out-flow vessels. COMPARISON:  PET-CT 01/03/2017 FINDINGS: Portal Vein Velocities Main:  41 cm/sec Right:  28 cm/sec Left:  22 cm/sec Hepatic Vein Velocities Right:  17 cm/sec Middle:  26 cm/sec Left:  23 cm/sec Hepatic Artery Velocity:  82 cm/sec Splenic Vein Velocity:  21 cm/sec Varices: Absent Ascites: Absent Benign-appearing cyst in the left lobe is stable  compare with the prior imaging study. Portal vein is patent with hepatopetal flow. Hepatic veins are patent with hepatofugal flow. Splenic vein is also patent with directionality towards the portal vein. IMPRESSION: Hepatic, portal, and splenic veins are patent with normal directionality of flow. Electronically Signed   By: Marybelle Killings M.D.   On: 01/23/2017 07:46   Ir Fluoro Guide Port Insertion Right  Result Date: 01/10/2017 CLINICAL DATA:  Esophageal carcinoma EXAM: RIGHT INTERNAL JUGULAR SINGLE LUMEN POWER PORT CATHETER INSERTION Date:  10/31/201810/31/2018 3:46 pm Radiologist:  M. Daryll Brod, MD Guidance:  Ultrasound and fluoroscopic MEDICATIONS: Ancef 2 g; The antibiotic was administered within an appropriate time interval prior to skin puncture. ANESTHESIA/SEDATION: Versed 2.0 mg IV; Fentanyl 50 mcg IV; Moderate Sedation Time:  27 minutes The patient was continuously monitored during the procedure by the interventional radiology nurse under my direct supervision. FLUOROSCOPY TIME:  One minutes, 12 seconds (30 mGy) COMPLICATIONS: None immediate. CONTRAST:  None. PROCEDURE: Informed consent was obtained from the patient following explanation of the procedure, risks, benefits and alternatives. The patient understands, agrees and consents for the procedure. All questions were addressed. A time out was performed. Maximal barrier sterile technique utilized including caps, mask, sterile gowns, sterile gloves, large sterile drape, hand hygiene, and 2% chlorhexidine scrub. Under sterile conditions and local anesthesia, right internal jugular micropuncture venous access was performed. Access was performed with ultrasound. Images were obtained for documentation. A guide wire was inserted followed by a transitional dilator. This allowed insertion of a guide wire and catheter into the IVC. Measurements were obtained from the SVC / RA junction back to the right IJ venotomy site. In the right infraclavicular chest, a  subcutaneous pocket was created over the second anterior rib. This was done under sterile conditions and local anesthesia. 1% lidocaine with epinephrine was utilized for this. A 2.5 cm incision was made in the skin. Blunt dissection was performed to create a subcutaneous pocket over the right pectoralis major muscle. The pocket was flushed with saline vigorously. There was adequate hemostasis. The port catheter was assembled and checked for leakage. The port catheter was secured in the pocket with two retention sutures. The tubing was tunneled subcutaneously to the right venotomy site and inserted into the SVC/RA junction through a valved peel-away sheath. Position was confirmed with fluoroscopy. Images were obtained for documentation. The patient tolerated the procedure well. No immediate complications. Incisions were closed in a two layer fashion with 4 - 0 Vicryl suture. Dermabond was applied to the skin. The port catheter was accessed, blood was aspirated followed by saline and  heparin flushes. Needle was removed. A dry sterile dressing was applied. IMPRESSION: Ultrasound and fluoroscopically guided right internal jugular single lumen power port catheter insertion. Tip in the SVC/RA junction. Catheter ready for use. Electronically Signed   By: Jerilynn Mages.  Shick M.D.   On: 01/10/2017 15:56     Assessment and plan- Patient is a 76 y.o. male non metastatic esophageal cancer [id number] [facility] here for on treatment assessment prior to cycle #2 of carbotaxol  1.  Abnormal LFTs: Bilirubin is trending down and so is alkaline phosphatase.  AST and ALT are slightly higher to stable.  Ultrasound of the liver dopplered showed patent hepatic portal and splenic veins.  I suspect his abnormal LFTs and bilirubin are secondary to TPN and will get better over the next couple of weeks.  He is currently off TPN and has been completely transitioned to J-tube feeds.  Continue to monitor  2.  Counts are otherwise okay to proceed with cycle  2 of weekly carbo/taxol today.  I will continue with chemotherapy at this time despite elevated AST ALT and bilirubin as this is likely secondary to TPN and there is no evidence of underlying chronic liver disease.  Since next week his Thanksgiving he will start cycle 3 of weekly carbotaxol on 02/06/2017.  He was directly proceed to chemotherapy on that day after getting CBC and CMP checked.  If his AST and ALT continue to trend up for dose 3 of chemotherapy I will consider dose reducing his Taxol at that time.  I will see him back on 02/13/2017 with CBC and CMP  3. Chemo induced nausea- he will take zofran 8 mg Q8 hours max 32 mg per day. If he has nausea in between, he will take ativan in between  4. J tube is flushing well and he is continuing J tube feeds and off TPN. Will take his PICC line out at this time  He knows to acll if he has any questions or concerns    Visit Diagnosis 1. Malignant neoplasm of lower third of esophagus (HCC)   2. Encounter for antineoplastic chemotherapy   3. Abnormal LFTs      Dr. Randa Evens, MD, MPH [facility] at [facility] Pager- [phone] 01/26/2017 9:21 AM

## 2017-01-26 NOTE — Progress Notes (Signed)
Pt reports that he was very restless after receiving 50mg  IV Benadryl with his first treatment. Spoke with Dr Janese Banks and per Dr Janese Banks okay to reduce dose to 25mg  IV.

## 2017-01-26 NOTE — Progress Notes (Signed)
Here for follow up .stated feeling nauseous on going. Zofran helps  helped a little per pt.

## 2017-01-26 NOTE — Telephone Encounter (Signed)
Nutrition Follow-up:  Spoke with patient via phone this pm for nutrition follow-up as RD at different clinic location today.   Patient reports that diarrhea has dramatically decreased from yesterday.  Reports took 2 doses of lomotil yesterday and none so far today. Report ran tube feeding of osmolite 1.5 yesterday starting at 2pm until 9am this morning and stopped it before coming to clinic.  Patient reports no problem with water flushes.  Has not ran tube feeding today due to radiation appointment, MD appointment and chemotherapy but will start soon.     Medications: reviewed  Labs: reviewed  Anthropometrics:  Noted weight today 182 lb   NUTRITION DIAGNOSIS: Inadequate oral intake continue but nutrition being addressed with tube feeding   MALNUTRITION DIAGNOSIS: continue to monitor   INTERVENTION:   Delay in getting correct formula this afternoon from [place].  Patient will use osmolite 1.5 until receives new formula of vital 1.5.   Plan is for patient to use vital 1.5 continuous via J-tube at 72ml/hr for 22 hours.  Patient will flush tube with 259ml of water before starting feeding and stopping feeding.  Patient will give additional 292ml 2 time per day during waking hours.   Patient will continue lomotil as needed to help with diarrhea.   Reviewed flushing and unclogging feeding tube technique with patient as had issue mid-week.   Questions answered.  Patient verbalized understanding.    MONITORING, EVALUATION, GOAL: weight trends, tube feeding tolerance.   NEXT VISIT: Monday, November 19 after radiation  Angelia Hazell B. Zenia Resides, [place], [place] Registered Dietitian [phone] (pager)

## 2017-01-29 ENCOUNTER — Ambulatory Visit: Payer: Medicare Other

## 2017-01-29 ENCOUNTER — Encounter: Payer: Self-pay | Admitting: Nurse Practitioner

## 2017-01-29 ENCOUNTER — Ambulatory Visit
Admission: RE | Admit: 2017-01-29 | Discharge: 2017-01-29 | Disposition: A | Discharge status: Home / Self Care | Payer: Medicare Other | Source: Ambulatory Visit | Attending: Radiation Oncology | Admitting: Radiation Oncology

## 2017-01-29 ENCOUNTER — Inpatient Hospital Stay: Payer: Medicare Other

## 2017-01-29 ENCOUNTER — Inpatient Hospital Stay (HOSPITAL_BASED_OUTPATIENT_CLINIC_OR_DEPARTMENT_OTHER): Payer: Medicare Other | Admitting: Nurse Practitioner

## 2017-01-29 DIAGNOSIS — K9419 Other complications of enterostomy: Secondary | ICD-10-CM | POA: Insufficient documentation

## 2017-01-29 DIAGNOSIS — C155 Malignant neoplasm of lower third of esophagus: Secondary | ICD-10-CM | POA: Diagnosis not present

## 2017-01-29 DIAGNOSIS — Z5111 Encounter for antineoplastic chemotherapy: Secondary | ICD-10-CM | POA: Diagnosis not present

## 2017-01-29 MED ORDER — ZINC OXIDE 20 % EX OINT: 1.0000 "application " | TOPICAL_OINTMENT | Freq: Four times a day (QID) | CUTANEOUS | 3 refills | Status: DC

## 2017-01-29 NOTE — Progress Notes (Signed)
Nutrition Follow-up:  Patient with squamous cell esophageal cancer.  Patient receiving chemotherapy and radiation therapy.  J-tube placed on 11/6.    Met with patient and wife following radiation today.  Patient reports that he received vital 1.2 (no stock of 1.5 until later in the week) on Saturday night.  Reports that he has been tolerating vital 1.2 at 74m/hr for 22 hours over the weekend.  Reports has not taking any lomotil since last Thursday and took 2 doses.  Reports no abdominal cramping.  Reports had 3 loose NOT watery bowel movements on Sunday and reports couple on Saturday.  Reports that on Saturday did not feel very good be attributed it to having chemotherapy on Friday.  Reports some nausea but not anymore that normal.  Patient frustrated with home health.    Patient reports that skin around J-tube is irritated and thinks it may be becoming infected.  Would like for someone to take a look at site if possible.    Patient has been off TPN since 01/24/2017.  Patient reports home health nurse is coming today to take out PICC line.     Estimated Energy Needs  Kcals: 2050-2460 calories/d Protein: 102-123 g/d Fluid: 2.4 L/d  NUTRITION DIAGNOSIS: Inadequate oral intake continues but nutrition being addressed with J-tube feeding   MALNUTRITION DIAGNOSIS: continue to monitor   INTERVENTION:   Patient to continue with vital 1.2 formula until vital 1.[place]  Patient can continue at 781mhr for 22 hours or try tube feeding at 8671mr for 18 hours again.  Written instructions given to patient today.  Patient verbalized understanding and questions answered.   Spoke with NP, Lauren and she will be able to see patient in clinic today regarding J-tube. Patient informed and moved to exam room.       MONITORING, EVALUATION, GOAL: weight trends, tube feeding tolerance   NEXT VISIT: November 29 following radiation appointment.    Ayomikun Starling B. AllZenia ResidesD,[place]istered Dietitian [phone])

## 2017-01-29 NOTE — Progress Notes (Signed)
Symptom Management Consult note [facility]  [phone] [phone]  Patient Care Team: Idelle Crouch, MD as PCP - General (Internal Medicine) Clent Jacks, RN as Registered Nurse Grace Isaac, MD as Consulting Physician (Cardiothoracic Surgery) Sindy Guadeloupe, MD as Consulting Physician (Oncology) Noreene Filbert, MD as Referring Physician (Radiation Oncology)   Name of the patient: Casey Acevedo  [id number]  January 01, 1941   Date of visit: 01/30/17  Diagnosis- Non-metastatic esophageal cancer [id number]  Chief complaint/ Reason for visit- Jejunostomy tube site pain and drainage  Heme/Onc history: patient last seen by primary oncologist, Dr. Janese Banks, on 01/26/17. Patient has a history of esophageal cancer and had a laparoscopic-assisted jejunostomy tube placed 01/16/17. Patient started radiation and initiated carbo-taxol chemotherapy on 01/19/17. Was previously on TPN due to esophageal mass with [facility] home [facility] providing home health. Now on J-tube feedings. Patient was deemed a potential surgical candidate with plan currently for patient to receive concurrent chemoradiation followed by EUS.   Interval history- In the interim, patient has been seen in the ED for dressing change 1 day post-op d/t concerns that dressing was saturated with blood. Conversion from TPN to J-tube feedings coordinated and managed by Jennet Maduro, dietitian. Initiated tube feedings on 01/20/17. Initially, some issues with constipation which was managed with miralax. As tube feeding rate increased, patient had episodes of diarrhea. Controlled with Lomotil. Nausea controlled with Zofran. Has required assistance with dressing changes tube flushes, and medications via tube. Patient has been off of TPN since 01/24/17 and reports that home health nurse is scheduled to remove his PICC line today.   Today, patient was seen by dietitian for routine follow-up and  expressed concerns of drainage from J-tube site and localized pain around tube. Asked dietician to have site evaluated. Patient states that he persistently sees a yellow drainage from site and his noting increased redness States the location is painful to the touch. For his dressing changes he has been using alcohol and 4 x 4 dressings with paper tape. He states that he changes his dressing multiple times a day. Patient and wife state that they are still somewhat uncomfortable managing J-tube. Denying any use of topicals around stomal site. Tube has plug/cork but no clamp.   ECOG FS:1 - Symptomatic but completely ambulatory  Review of systems- Review of Systems  Constitutional: Negative for chills, fever and malaise/fatigue.  Respiratory: Negative for cough and shortness of breath.   Cardiovascular: Negative for chest pain and palpitations.  Gastrointestinal: Positive for nausea. Negative for abdominal pain, constipation, diarrhea and vomiting.  Genitourinary: Negative for dysuria.  Musculoskeletal: Negative for myalgias.  Skin:       Redness, discomfort at J tube site  Neurological: Negative for dizziness, weakness and headaches.  Endo/Heme/Allergies: Does not bruise/bleed easily.  Psychiatric/Behavioral: The patient is nervous/anxious.      Current treatment- s/p cycle 1, day 8 carbo/taxol w/ concurrent radiation  No Known Allergies   Past Medical History:  Diagnosis Date  . Cancer (HCC)    esophageal  . Dysphagia   . GERD (gastroesophageal reflux disease)     Past Surgical History:  Procedure Laterality Date  . CHOLECYSTECTOMY    . ESOPHAGOGASTRODUODENOSCOPY (EGD) WITH PROPOFOL N/A 12/07/2016   Performed by Jonathon Bellows, MD at [place]  . ESOPHAGOGASTRODUODENOSCOPY (EGD) WITH PROPOFOL WITH DILATION N/A 12/26/2016   Performed by Jonathon Bellows, MD at [place]  . EYE SURGERY    . IR FLUORO GUIDE  PORT INSERTION RIGHT  01/10/2017  . LAPROSCOPIC ASSIST FEEDING JEJUNOSTOMY  TUBE N/A 01/16/2017   Performed by Johnathan Hausen, MD at [facility]  . PICC LINE INSERTION Right     Social History   Socioeconomic History  . Marital status: Married    Spouse name: Not on file  . Number of children: Not on file  . Years of education: Not on file  . Highest education level: Not on file  Social Needs  . Financial resource strain: Not on file  . Food insecurity - worry: Not on file  . Food insecurity - inability: Not on file  . Transportation needs - medical: Not on file  . Transportation needs - non-medical: Not on file  Occupational History  . Not on file  Tobacco Use  . Smoking status: Never Smoker  . Smokeless tobacco: Never Used  Substance and Sexual Activity  . Alcohol use: No  . Drug use: No  . Sexual activity: Not Currently  Other Topics Concern  . Not on file  Social History Narrative   Independent at baseline. Ambulatory    Family History  Problem Relation Age of Onset  . Heart failure Father   . Prostate cancer Neg Hx   . Bladder Cancer Neg Hx   . Kidney cancer Neg Hx     Current Outpatient Medications:  .  dexamethasone 0.5 MG/5ML elixir, 80 mLs (8 mg total) by Per J Tube route daily. Start the day after chemotherapy and take for 2 days (after every chemotherapy treatment), Disp: 320 mL, Rfl: 3 .  diphenoxylate-atropine (LOMOTIL) 2.5-0.025 MG tablet, Take 1 tablet 4 (four) times daily as needed by mouth for diarrhea or loose stools., Disp: 120 tablet, Rfl: 0 .  hydrocortisone cream-nystatin cream-zinc oxide, Apply 1 application 4 (four) times daily topically., Disp: 4 oz, Rfl: 3 .  lidocaine-prilocaine (EMLA) cream, Apply to affected area once, Disp: 30 g, Rfl: 3 .  LORazepam (ATIVAN) 0.5 MG tablet, Take 1 tablet (0.5 mg total) by mouth every 6 (six) hours as needed (Nausea or vomiting). (Patient not taking: Reported on 01/26/2017), Disp: 30 tablet, Rfl: 0 .  Nutritional Supplements (FEEDING SUPPLEMENT, VITAL 1.5 CAL,) LIQD, Give vital 1.5 via  continuous pump at 65ml/hr for 22 hours.  Flush with 262ml of water before starting tube feeding and after stopping tube feeding.  Provide additional 249ml of water 2 other times during waking hours via J tube., Disp: 1540 mL, Rfl: 12 .  ondansetron (ZOFRAN ODT) 4 MG disintegrating tablet, Take 1 tablet (4 mg total) by mouth every 8 (eight) hours as needed for nausea or vomiting. (Patient not taking: Reported on 01/26/2017), Disp: 30 tablet, Rfl: 0 .  ondansetron (ZOFRAN) 8 MG tablet, Take 1 tablet (8 mg total) by mouth 2 (two) times daily as needed for refractory nausea / vomiting. Start on day 3 after chemo., Disp: 30 tablet, Rfl: 1 .  prochlorperazine (COMPAZINE) 10 MG tablet, Take 1 tablet (10 mg total) by mouth every 6 (six) hours as needed (Nausea or vomiting). (Patient not taking: Reported on 01/26/2017), Disp: 30 tablet, Rfl: 1 No current facility-administered medications for this visit.   Facility-Administered Medications Ordered in Other Visits:  .  ondansetron (ZOFRAN) 8 mg, dexamethasone (DECADRON) 10 mg in sodium chloride 0.9 % 50 mL IVPB, , Intravenous, Once, Sindy Guadeloupe, MD  Physical exam: 129/79, HR 98, RR 18, T 97.6 tempanic, weight 182 lb. Pain - 0.  Physical Exam  Constitutional: He is oriented to  person, place, and time and well-developed, well-nourished, and in no distress.  HENT:  Head: Normocephalic and atraumatic.  Eyes: Conjunctivae are normal. No scleral icterus.  Neck: No JVD present.  Cardiovascular: Normal rate and regular rhythm.  Pulmonary/Chest: Effort normal and breath sounds normal. No respiratory distress. He has no wheezes.  Abdominal: Soft. He exhibits no distension. There is no tenderness.  Musculoskeletal: He exhibits no edema or deformity.  Lymphadenopathy:    He has no cervical adenopathy.  Neurological: He is alert and oriented to person, place, and time.  Skin: Skin is warm and dry.  Redness immediately bordering stoma, skin intact, sensitive to  touch, peristomal leakage apparent (yellow/brown), not malodorous, moisture associated skin damage. J tube sutured into place. Yellow cap/cork in place. Dry dressing of 4x4 & paper tape removed.   Psychiatric: Affect normal. His mood appears anxious.     CMP Latest Ref Rng & Units 01/26/2017  Glucose 65 - 99 mg/dL 106(H)  BUN 6 - 20 mg/dL 25(H)  Creatinine 0.61 - 1.24 mg/dL 0.95  Sodium 135 - 145 mmol/L 136  Potassium 3.5 - 5.1 mmol/L 3.9  Chloride 101 - 111 mmol/L 102  CO2 22 - 32 mmol/L 26  Calcium 8.9 - 10.3 mg/dL 8.1(L)  Total Protein 6.5 - 8.1 g/dL 6.9  Total Bilirubin 0.3 - 1.2 mg/dL 1.3(H)  Alkaline Phos 38 - 126 U/L 261(H)  AST 15 - 41 U/L 70(H)  ALT 17 - 63 U/L 337(H)   CBC Latest Ref Rng & Units 01/26/2017  WBC 3.8 - 10.6 K/uL 6.0  Hemoglobin 13.0 - 18.0 g/dL 13.0  Hematocrit 40.0 - 52.0 % 38.9(L)  Platelets 150 - 440 K/uL 203    No images are attached to the encounter.  Nm Pet Image Initial (pi) Skull Base To Thigh  Result Date: 01/03/2017 CLINICAL DATA:  Initial treatment strategy for esophageal cancer. EXAM: NUCLEAR MEDICINE PET SKULL BASE TO THIGH TECHNIQUE: Twelve point for mCi F-18 FDG was injected intravenously. Full-ring PET imaging was performed from the skull base to thigh after the radiotracer. CT data was obtained and used for attenuation correction and anatomic localization. FASTING BLOOD GLUCOSE:  Value: 127 mg/dl COMPARISON:  CT chest abdomen pelvis 12/26/2016. FINDINGS: NECK: No hypermetabolic lymph nodes in the neck. CT images show no acute findings. CHEST: No hypermetabolic mediastinal, hilar or axillary lymph nodes. Distal esophageal mass measures approximately 2.5 x 2.7 cm with an SUV max of 8.0. No adjacent hypermetabolic adenopathy. No hypermetabolic pulmonary nodules. Atherosclerotic calcification of the arterial vasculature, including coronary arteries. Right PICC tip terminates at the SVC RA junction. Heart is mildly enlarged. No pericardial or  pleural effusion. Esophagus is dilated throughout its course, to the level of the above-described esophageal mass. ABDOMEN/PELVIS: No abnormal hypermetabolism in the liver, adrenal glands, spleen or pancreas. No hypermetabolic lymph nodes. Subcentimeter low-attenuation lesion in the dome of the liver is too small to characterize. Cholecystectomy. Adrenal glands, kidneys, spleen, pancreas, stomach and bowel are grossly unremarkable. Atherosclerotic calcification of the arterial vasculature without aneurysm. Prostate is mildly enlarged. No free fluid. SKELETON: No abnormal osseous hypermetabolism. IMPRESSION: 1. Hypermetabolic distal esophageal mass with associated esophageal dilatation. No hypermetabolic adenopathy or distant metastatic disease. 2. Aortic atherosclerosis (ICD10-170.0). Coronary artery calcification. Electronically Signed   By: Lorin Picket M.D.   On: 01/03/2017 14:44   US Liver Doppler  Result Date: 01/23/2017 CLINICAL DATA:  Esophageal cancer EXAM: DUPLEX ULTRASOUND OF LIVER TECHNIQUE: Color and duplex Doppler ultrasound was performed to evaluate the  hepatic in-flow and out-flow vessels. COMPARISON:  PET-CT 01/03/2017 FINDINGS: Portal Vein Velocities Main:  41 cm/sec Right:  28 cm/sec Left:  22 cm/sec Hepatic Vein Velocities Right:  17 cm/sec Middle:  26 cm/sec Left:  23 cm/sec Hepatic Artery Velocity:  82 cm/sec Splenic Vein Velocity:  21 cm/sec Varices: Absent Ascites: Absent Benign-appearing cyst in the left lobe is stable compare with the prior imaging study. Portal vein is patent with hepatopetal flow. Hepatic veins are patent with hepatofugal flow. Splenic vein is also patent with directionality towards the portal vein. IMPRESSION: Hepatic, portal, and splenic veins are patent with normal directionality of flow. Electronically Signed   By: Marybelle Killings M.D.   On: 01/23/2017 07:46   Ir Fluoro Guide Port Insertion Right  Result Date: 01/10/2017 CLINICAL DATA:  Esophageal carcinoma  EXAM: RIGHT INTERNAL JUGULAR SINGLE LUMEN POWER PORT CATHETER INSERTION Date:  10/31/201810/31/2018 3:46 pm Radiologist:  M. Daryll Brod, MD Guidance:  Ultrasound and fluoroscopic MEDICATIONS: Ancef 2 g; The antibiotic was administered within an appropriate time interval prior to skin puncture. ANESTHESIA/SEDATION: Versed 2.0 mg IV; Fentanyl 50 mcg IV; Moderate Sedation Time:  27 minutes The patient was continuously monitored during the procedure by the interventional radiology nurse under my direct supervision. FLUOROSCOPY TIME:  One minutes, 12 seconds (30 mGy) COMPLICATIONS: None immediate. CONTRAST:  None. PROCEDURE: Informed consent was obtained from the patient following explanation of the procedure, risks, benefits and alternatives. The patient understands, agrees and consents for the procedure. All questions were addressed. A time out was performed. Maximal barrier sterile technique utilized including caps, mask, sterile gowns, sterile gloves, large sterile drape, hand hygiene, and 2% chlorhexidine scrub. Under sterile conditions and local anesthesia, right internal jugular micropuncture venous access was performed. Access was performed with ultrasound. Images were obtained for documentation. A guide wire was inserted followed by a transitional dilator. This allowed insertion of a guide wire and catheter into the IVC. Measurements were obtained from the SVC / RA junction back to the right IJ venotomy site. In the right infraclavicular chest, a subcutaneous pocket was created over the second anterior rib. This was done under sterile conditions and local anesthesia. 1% lidocaine with epinephrine was utilized for this. A 2.5 cm incision was made in the skin. Blunt dissection was performed to create a subcutaneous pocket over the right pectoralis major muscle. The pocket was flushed with saline vigorously. There was adequate hemostasis. The port catheter was assembled and checked for leakage. The port catheter  was secured in the pocket with two retention sutures. The tubing was tunneled subcutaneously to the right venotomy site and inserted into the SVC/RA junction through a valved peel-away sheath. Position was confirmed with fluoroscopy. Images were obtained for documentation. The patient tolerated the procedure well. No immediate complications. Incisions were closed in a two layer fashion with 4 - 0 Vicryl suture. Dermabond was applied to the skin. The port catheter was accessed, blood was aspirated followed by saline and heparin flushes. Needle was removed. A dry sterile dressing was applied. IMPRESSION: Ultrasound and fluoroscopically guided right internal jugular single lumen power port catheter insertion. Tip in the SVC/RA junction. Catheter ready for use. Electronically Signed   By: Jerilynn Mages.  Shick M.D.   On: 01/10/2017 15:56   16 red robinson catheter  Assessment and plan- Patient is a 76 y.o. male with nonmetastatic esophageal cancer diagnosed after patient complaints of progressive dysphagia. Jejunostomy tube placed 01/16/17 by Dr. Hassell Done this patient was unable to swallow. Transitioned from TPN  to feedings through J-tube. Presents to symptom management today for concerns of possible infection, peristomal leakage, and irritation around stoma.    1. Jejunostomy tube site pain - stomal site does not appear infected at this time but does show evidence of moisture associated skin damage. Patient currently only using dry dressings. Concern of excessive movement of tube causing leakage. Likely leakage is acidic and causing skin breakdown. Area does not appear infected at this time. Antibiotics could be considered if patient develops fever,pustules develop, pain or redness worsen or spread. Generally Keflex or clindamycin as effective.  Recommend use of barrier cream, such as Gerhardt's cream (hydrocortisone-nystatin-zinc oxide) 2-4 times a day to protect peri-stomal skin.  Encouraged keeping site clean and minimizing  movement. Foam dressings could be considered, such as Allevyn, but are generally cost prohibitive must be changed frequently.  Could also consider referral to wound care center for further education and assessment this patient appears quite anxious regarding tube maintenance management.   Visit Diagnosis 1. Jejunostomy tube site pain [facility])     Patient expressed understanding and was in agreement with this plan. He also understands that He can call clinic at any time with any questions, concerns, or complaints. Please notify me if you've had no improvement in your symptoms, or symptoms become worse, or fever develops, in the next 3-4 days.  A total of (30) minutes of face-to-face time was spent with this patient with greater than 50% of that time in counseling and care-coordination including topics of: Case discussed with wound care nurse, coordinated compounding of medication with patient's pharmacy, education with patient regarding stomal care including cleaning, application of barrier cream, and dressing changes.   Beckey Rutter, DNP AGNP-C [facility] at [facility] Pager- [place]- [phone] 01/30/2017 11:27 AM

## 2017-01-30 ENCOUNTER — Inpatient Hospital Stay (HOSPITAL_BASED_OUTPATIENT_CLINIC_OR_DEPARTMENT_OTHER): Payer: Medicare Other | Admitting: Oncology

## 2017-01-30 ENCOUNTER — Other Ambulatory Visit: Payer: Self-pay | Admitting: *Deleted

## 2017-01-30 ENCOUNTER — Ambulatory Visit: Payer: Medicare Other

## 2017-01-30 ENCOUNTER — Telehealth: Payer: Self-pay | Admitting: Oncology

## 2017-01-30 ENCOUNTER — Encounter: Payer: Self-pay | Admitting: Oncology

## 2017-01-30 ENCOUNTER — Ambulatory Visit
Admission: RE | Admit: 2017-01-30 | Discharge: 2017-01-30 | Disposition: A | Discharge status: Home / Self Care | Payer: Medicare Other | Source: Ambulatory Visit | Attending: Radiation Oncology | Admitting: Radiation Oncology

## 2017-01-30 ENCOUNTER — Inpatient Hospital Stay: Payer: Medicare Other

## 2017-01-30 VITALS — BP 125/78 | HR 109 | Temp 97.2°F | Resp 18 | Wt 179.2 lb

## 2017-01-30 DIAGNOSIS — E860 Dehydration: Secondary | ICD-10-CM

## 2017-01-30 DIAGNOSIS — R110 Nausea: Secondary | ICD-10-CM

## 2017-01-30 DIAGNOSIS — R112 Nausea with vomiting, unspecified: Secondary | ICD-10-CM

## 2017-01-30 DIAGNOSIS — R197 Diarrhea, unspecified: Secondary | ICD-10-CM | POA: Diagnosis not present

## 2017-01-30 DIAGNOSIS — Z95828 Presence of other vascular implants and grafts: Secondary | ICD-10-CM

## 2017-01-30 DIAGNOSIS — Z79899 Other long term (current) drug therapy: Secondary | ICD-10-CM | POA: Diagnosis not present

## 2017-01-30 DIAGNOSIS — C155 Malignant neoplasm of lower third of esophagus: Secondary | ICD-10-CM

## 2017-01-30 DIAGNOSIS — T451X5S Adverse effect of antineoplastic and immunosuppressive drugs, sequela: Secondary | ICD-10-CM | POA: Diagnosis not present

## 2017-01-30 DIAGNOSIS — T451X5A Adverse effect of antineoplastic and immunosuppressive drugs, initial encounter: Secondary | ICD-10-CM

## 2017-01-30 DIAGNOSIS — Z5111 Encounter for antineoplastic chemotherapy: Secondary | ICD-10-CM | POA: Diagnosis not present

## 2017-01-30 MED ORDER — OLANZAPINE 10 MG PO TABS: 10.0000 mg | ORAL_TABLET | Freq: Every day | ORAL | 2 refills | Status: DC

## 2017-01-30 MED ORDER — SODIUM CHLORIDE 0.9% FLUSH
10.0000 mL | INTRAVENOUS | Status: DC | PRN
  Administered 2017-01-30: 10 mL via INTRAVENOUS
  Filled 2017-01-30: qty 10

## 2017-01-30 MED ORDER — SODIUM CHLORIDE 0.9 % IV SOLN
Freq: Once | INTRAVENOUS | Status: AC
  Administered 2017-01-30: 11:00:00 via INTRAVENOUS
  Filled 2017-01-30: qty 1000

## 2017-01-30 MED ORDER — SODIUM CHLORIDE 0.9 % IV SOLN
Freq: Once | INTRAVENOUS | Status: DC
  Filled 2017-01-30: qty 1000

## 2017-01-30 MED ORDER — SODIUM CHLORIDE 0.9 % IV SOLN
Freq: Once | INTRAVENOUS | Status: AC
  Administered 2017-01-30: 12:00:00 via INTRAVENOUS
  Filled 2017-01-30: qty 4

## 2017-01-30 MED ORDER — HEPARIN SOD (PORK) LOCK FLUSH 100 UNIT/ML IV SOLN
500.0000 [IU] | Freq: Once | INTRAVENOUS | Status: AC
  Administered 2017-01-30: 500 [IU] via INTRAVENOUS
  Filled 2017-01-30: qty 5

## 2017-01-30 MED ORDER — SODIUM CHLORIDE 0.9 % IV SOLN
Freq: Once | INTRAVENOUS | Status: DC
  Filled 2017-01-30: qty 4

## 2017-01-30 NOTE — Telephone Encounter (Signed)
MD on 11/27 @ 10:15 a.m. per Dr Rao/schd Theora Gianotti. (ok to dbl book)

## 2017-01-30 NOTE — Telephone Encounter (Signed)
Per 01/30/17 los, See MD on 02/06/17.  Dr Janese Banks has no availablity to accommodate patoent's other appointments previously scheduled. Msg sent to Dr Janese Banks, asking what time she can accommodate patient on 02/06/17.

## 2017-01-30 NOTE — Progress Notes (Signed)
Hematology/Oncology Consult note [facility]  [phone] [phone]) [phone]  Patient Care Team: Idelle Crouch, MD as PCP - General (Internal Medicine) Clent Jacks, RN as Registered Nurse Grace Isaac, MD as Consulting Physician (Cardiothoracic Surgery) Sindy Guadeloupe, MD as Consulting Physician (Oncology) Noreene Filbert, MD as Referring Physician (Radiation Oncology)   Name of the patient: Casey Acevedo  [id number]  16-Oct-1940   Date of visit: 01/30/17  Diagnosis- non metastatic esophageal cancer [id number] [facility]   Chief complaint/ Reason for visit- sick visit for nausea and diarrhea  Heme/Onc history: 1. Patient is a 76 year old gentleman with no significant past medical history and takes no medications he has a remote history of smokingduring his college days and quit smoking thereafter. No history of any alcohol intake. He was recently admitted to the hospital on 12/06/2016 with symptoms of progressive dysphagia which he states has been going on since the starting of September. His dysphagia got to the point that he began to have pain even on swallowing icecream. He underwent EGD at that time which showed but high 2 cm long stricture 2 cm above the GE junction. Biopsies at that time were negative for malignancy. Given that he was unable to eat he was started on TPN at that time and plan was to get a repeat EGD with possible biopsy  2. Patient underwent repeat EGD on 12/26/2016 where he was again noted to have a lower esophageal stricture which reduced the lumen to less than 3-4 mm. There was no evidence of Barrett's esophagus. 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 Shortly after the stricture was dilated the stricture did close up significantly. Postprocedure patient complained of abdominal pain and there was a concern for  perforation and hence a repeat CT abdomen was obtained which did not show any evidence of perforation  3. CT abdomen pelvis as well as CT chest with contrast on 10/01/2018showed: 7 mm sub pleural nodule in the lingula. No evidence of axillary mediastinal or hilar adenopathy.1.3 cm soft tissue nodule adjacent to the GE junctionwhich is favored to represent an enlarged lymph node potentially active or metastatic in etiology. Narrowing involving thickening of the distal esophagus compatible with known distal esophageal stricture  4. Patient has been referred to [place] for further management. He is tolerating his TPN well and otherwise reports no complaints. He lives with his wife and is independent of his ADLs and IADLs. Besides dysphagia patient reports no loss of appetite or unintentional weight loss over the last few months. He denies any pain  5.[place] been deemed to be a potential surgical candidate in the future. Plan for now is to proceed with concurrent chemoradiation followed by EUS upon completion of treatment given difficulty with the  second EGD  6. Chemo/RT started on 01/19/17  Interval history- patient feels quite nauseous since his chemo. He is still not at goal with his J tube feeds. Reports having diarrhea when he goes up to rate of 85. Feels tired and light headed  ECOG PS- 1 Pain scale- 0   Review of systems- Review of Systems  Constitutional: Positive for malaise/fatigue. Negative for chills, fever and weight loss.  HENT: Negative for congestion, ear discharge and nosebleeds.   Eyes: Negative for blurred vision.  Respiratory: Negative for cough, hemoptysis, sputum production, shortness of breath  and wheezing.   Cardiovascular: Negative for chest pain, palpitations, orthopnea and claudication.  Gastrointestinal: Positive for diarrhea and nausea. Negative for abdominal pain, blood in stool,  constipation, heartburn, melena and vomiting.  Genitourinary: Negative for dysuria, flank pain, frequency, hematuria and urgency.  Musculoskeletal: Negative for back pain, joint pain and myalgias.  Skin: Negative for rash.  Neurological: Positive for dizziness. Negative for tingling, focal weakness, seizures, weakness and headaches.  Endo/Heme/Allergies: Does not bruise/bleed easily.  Psychiatric/Behavioral: Negative for depression and suicidal ideas. The patient does not have insomnia.      No Known Allergies   Past Medical History:  Diagnosis Date  . Cancer (HCC)    esophageal  . Dysphagia   . GERD (gastroesophageal reflux disease)      Past Surgical History:  Procedure Laterality Date  . CHOLECYSTECTOMY    . ESOPHAGOGASTRODUODENOSCOPY (EGD) WITH PROPOFOL N/A 12/07/2016   Procedure: ESOPHAGOGASTRODUODENOSCOPY (EGD) WITH PROPOFOL;  Surgeon: Jonathon Bellows, MD;  Location: [facility] ENDOSCOPY;  Service: Gastroenterology;  Laterality: N/A;  . ESOPHAGOGASTRODUODENOSCOPY (EGD) WITH PROPOFOL N/A 12/26/2016   Procedure: ESOPHAGOGASTRODUODENOSCOPY (EGD) WITH PROPOFOL WITH DILATION;  Surgeon: Jonathon Bellows, MD;  Location: [facility] ENDOSCOPY;  Service: Gastroenterology;  Laterality: N/A;  . EYE SURGERY    . GASTROJEJUNOSTOMY N/A 01/16/2017   Procedure: LAPROSCOPIC ASSIST FEEDING JEJUNOSTOMY TUBE;  Surgeon: Johnathan Hausen, MD;  Location: [facility] ORS;  Service: General;  Laterality: N/A;  . IR FLUORO GUIDE PORT INSERTION RIGHT  01/10/2017  . PICC LINE INSERTION Right     Social History   Socioeconomic History  . Marital status: Married    Spouse name: Not on file  . Number of children: Not on file  . Years of education: Not on file  . Highest education level: Not on file  Social Needs  . Financial resource strain: Not on file  . Food insecurity - worry: Not on file  . Food insecurity - inability: Not on file  . Transportation needs - medical: Not on file  . Transportation needs - non-medical: Not on  file  Occupational History  . Not on file  Tobacco Use  . Smoking status: Never Smoker  . Smokeless tobacco: Never Used  Substance and Sexual Activity  . Alcohol use: No  . Drug use: No  . Sexual activity: Not Currently  Other Topics Concern  . Not on file  Social History Narrative   Independent at baseline. Ambulatory    Family History  Problem Relation Age of Onset  . Heart failure Father   . Prostate cancer Neg Hx   . Bladder Cancer Neg Hx   . Kidney cancer Neg Hx      Current Outpatient Medications:  .  dexamethasone 0.5 MG/5ML elixir, 80 mLs (8 mg total) by Per J Tube route daily. Start the day after chemotherapy and take for 2 days (after every chemotherapy treatment), Disp: 320 mL, Rfl: 3 .  diphenoxylate-atropine (LOMOTIL) 2.5-0.025 MG tablet, Take 1 tablet 4 (four) times daily as needed by mouth for diarrhea or loose stools., Disp: 120 tablet, Rfl: 0 .  hydrocortisone cream-nystatin cream-zinc oxide, Apply 1 application 4 (four) times daily topically., Disp: 4 oz, Rfl: 3 .  lidocaine-prilocaine (EMLA) cream, Apply to affected area once, Disp: 30 g, Rfl: 3 .  LORazepam (ATIVAN) 0.5 MG tablet, Take 1 tablet (0.5 mg total) by mouth every 6 (six) hours as needed (Nausea or vomiting). (Patient not taking: Reported on 01/26/2017), Disp: 30 tablet, Rfl: 0 .  Nutritional Supplements (FEEDING SUPPLEMENT, VITAL  1.5 CAL,) LIQD, Give vital 1.5 via continuous pump at 80ml/hr for 22 hours.  Flush with 264ml of water before starting tube feeding and after stopping tube feeding.  Provide additional 220ml of water 2 other times during waking hours via J tube., Disp: 1540 mL, Rfl: 12 .  OLANZapine (ZYPREXA) 10 MG tablet, Take 1 tablet (10 mg total) by mouth at bedtime., Disp: 30 tablet, Rfl: 2 .  ondansetron (ZOFRAN ODT) 4 MG disintegrating tablet, Take 1 tablet (4 mg total) by mouth every 8 (eight) hours as needed for nausea or vomiting. (Patient not taking: Reported on 01/26/2017), Disp: 30  tablet, Rfl: 0 .  ondansetron (ZOFRAN) 8 MG tablet, Take 1 tablet (8 mg total) by mouth 2 (two) times daily as needed for refractory nausea / vomiting. Start on day 3 after chemo., Disp: 30 tablet, Rfl: 1 .  prochlorperazine (COMPAZINE) 10 MG tablet, Take 1 tablet (10 mg total) by mouth every 6 (six) hours as needed (Nausea or vomiting). (Patient not taking: Reported on 01/26/2017), Disp: 30 tablet, Rfl: 1 No current facility-administered medications for this visit.   Facility-Administered Medications Ordered in Other Visits:  .  heparin lock flush 100 unit/mL, 500 Units, Intravenous, Once, Sindy Guadeloupe, MD .  sodium chloride flush (NS) 0.9 % injection 10 mL, 10 mL, Intravenous, PRN, Sindy Guadeloupe, MD  Physical exam:  Vitals:   01/30/17 1000  BP: 125/78  Pulse: (!) 109  Resp: 18  Temp: (!) 97.2 F (36.2 C)  TempSrc: Tympanic  Weight: 179 lb 3.7 oz (81.3 kg)   Physical Exam  Constitutional: He is oriented to person, place, and time.  Fatigued. Appears in no acute distress  HENT:  Head: Normocephalic and atraumatic.  Mouth/Throat: Oropharynx is clear and moist.  Eyes: EOM are normal. Pupils are equal, round, and reactive to light.  Neck: Normal range of motion.  Cardiovascular: Normal rate, regular rhythm and normal heart sounds.  Pulmonary/Chest: Effort normal and breath sounds normal.  Abdominal: Soft. Bowel sounds are normal.  J tube in place. Has minimal serous discharge around it  Neurological: He is alert and oriented to person, place, and time.  Skin: Skin is warm and dry.     CMP Latest Ref Rng & Units 01/26/2017  Glucose 65 - 99 mg/dL 106(H)  BUN 6 - 20 mg/dL 25(H)  Creatinine 0.61 - 1.24 mg/dL 0.95  Sodium 135 - 145 mmol/L 136  Potassium 3.5 - 5.1 mmol/L 3.9  Chloride 101 - 111 mmol/L 102  CO2 22 - 32 mmol/L 26  Calcium 8.9 - 10.3 mg/dL 8.1(L)  Total Protein 6.5 - 8.1 g/dL 6.9  Total Bilirubin 0.3 - 1.2 mg/dL 1.3(H)  Alkaline Phos 38 - 126 U/L 261(H)  AST 15  - 41 U/L 70(H)  ALT 17 - 63 U/L 337(H)   CBC Latest Ref Rng & Units 01/26/2017  WBC 3.8 - 10.6 K/uL 6.0  Hemoglobin 13.0 - 18.0 g/dL 13.0  Hematocrit 40.0 - 52.0 % 38.9(L)  Platelets 150 - 440 K/uL 203    No images are attached to the encounter.  Nm Pet Image Initial (pi) Skull Base To Thigh  Result Date: 01/03/2017 CLINICAL DATA:  Initial treatment strategy for esophageal cancer. EXAM: NUCLEAR MEDICINE PET SKULL BASE TO THIGH TECHNIQUE: Twelve point for mCi F-18 FDG was injected intravenously. Full-ring PET imaging was performed from the skull base to thigh after the radiotracer. CT data was obtained and used for attenuation correction and anatomic localization. FASTING BLOOD  GLUCOSE:  Value: 127 mg/dl COMPARISON:  CT chest abdomen pelvis 12/26/2016. FINDINGS: NECK: No hypermetabolic lymph nodes in the neck. CT images show no acute findings. CHEST: No hypermetabolic mediastinal, hilar or axillary lymph nodes. Distal esophageal mass measures approximately 2.5 x 2.7 cm with an SUV max of 8.0. No adjacent hypermetabolic adenopathy. No hypermetabolic pulmonary nodules. Atherosclerotic calcification of the arterial vasculature, including coronary arteries. Right PICC tip terminates at the SVC RA junction. Heart is mildly enlarged. No pericardial or pleural effusion. Esophagus is dilated throughout its course, to the level of the above-described esophageal mass. ABDOMEN/PELVIS: No abnormal hypermetabolism in the liver, adrenal glands, spleen or pancreas. No hypermetabolic lymph nodes. Subcentimeter low-attenuation lesion in the dome of the liver is too small to characterize. Cholecystectomy. Adrenal glands, kidneys, spleen, pancreas, stomach and bowel are grossly unremarkable. Atherosclerotic calcification of the arterial vasculature without aneurysm. Prostate is mildly enlarged. No free fluid. SKELETON: No abnormal osseous hypermetabolism. IMPRESSION: 1. Hypermetabolic distal esophageal mass with  associated esophageal dilatation. No hypermetabolic adenopathy or distant metastatic disease. 2. Aortic atherosclerosis (ICD10-170.0). Coronary artery calcification. Electronically Signed   By: Lorin Picket M.D.   On: 01/03/2017 14:44   US Liver Doppler  Result Date: 01/23/2017 CLINICAL DATA:  Esophageal cancer EXAM: DUPLEX ULTRASOUND OF LIVER TECHNIQUE: Color and duplex Doppler ultrasound was performed to evaluate the hepatic in-flow and out-flow vessels. COMPARISON:  PET-CT 01/03/2017 FINDINGS: Portal Vein Velocities Main:  41 cm/sec Right:  28 cm/sec Left:  22 cm/sec Hepatic Vein Velocities Right:  17 cm/sec Middle:  26 cm/sec Left:  23 cm/sec Hepatic Artery Velocity:  82 cm/sec Splenic Vein Velocity:  21 cm/sec Varices: Absent Ascites: Absent Benign-appearing cyst in the left lobe is stable compare with the prior imaging study. Portal vein is patent with hepatopetal flow. Hepatic veins are patent with hepatofugal flow. Splenic vein is also patent with directionality towards the portal vein. IMPRESSION: Hepatic, portal, and splenic veins are patent with normal directionality of flow. Electronically Signed   By: Marybelle Killings M.D.   On: 01/23/2017 07:46   Ir Fluoro Guide Port Insertion Right  Result Date: 01/10/2017 CLINICAL DATA:  Esophageal carcinoma EXAM: RIGHT INTERNAL JUGULAR SINGLE LUMEN POWER PORT CATHETER INSERTION Date:  10/31/201810/31/2018 3:46 pm Radiologist:  M. Daryll Brod, MD Guidance:  Ultrasound and fluoroscopic MEDICATIONS: Ancef 2 g; The antibiotic was administered within an appropriate time interval prior to skin puncture. ANESTHESIA/SEDATION: Versed 2.0 mg IV; Fentanyl 50 mcg IV; Moderate Sedation Time:  27 minutes The patient was continuously monitored during the procedure by the interventional radiology nurse under my direct supervision. FLUOROSCOPY TIME:  One minutes, 12 seconds (30 mGy) COMPLICATIONS: None immediate. CONTRAST:  None. PROCEDURE: Informed consent was obtained  from the patient following explanation of the procedure, risks, benefits and alternatives. The patient understands, agrees and consents for the procedure. All questions were addressed. A time out was performed. Maximal barrier sterile technique utilized including caps, mask, sterile gowns, sterile gloves, large sterile drape, hand hygiene, and 2% chlorhexidine scrub. Under sterile conditions and local anesthesia, right internal jugular micropuncture venous access was performed. Access was performed with ultrasound. Images were obtained for documentation. A guide wire was inserted followed by a transitional dilator. This allowed insertion of a guide wire and catheter into the IVC. Measurements were obtained from the SVC / RA junction back to the right IJ venotomy site. In the right infraclavicular chest, a subcutaneous pocket was created over the second anterior rib. This was done under sterile conditions  and local anesthesia. 1% lidocaine with epinephrine was utilized for this. A 2.5 cm incision was made in the skin. Blunt dissection was performed to create a subcutaneous pocket over the right pectoralis major muscle. The pocket was flushed with saline vigorously. There was adequate hemostasis. The port catheter was assembled and checked for leakage. The port catheter was secured in the pocket with two retention sutures. The tubing was tunneled subcutaneously to the right venotomy site and inserted into the SVC/RA junction through a valved peel-away sheath. Position was confirmed with fluoroscopy. Images were obtained for documentation. The patient tolerated the procedure well. No immediate complications. Incisions were closed in a two layer fashion with 4 - 0 Vicryl suture. Dermabond was applied to the skin. The port catheter was accessed, blood was aspirated followed by saline and heparin flushes. Needle was removed. A dry sterile dressing was applied. IMPRESSION: Ultrasound and fluoroscopically guided right  internal jugular single lumen power port catheter insertion. Tip in the SVC/RA junction. Catheter ready for use. Electronically Signed   By: Jerilynn Mages.  Shick M.D.   On: 01/10/2017 15:56     Assessment and plan- Patient is a 76 y.o. male non metastatic esophageal cancer [id number] [facility] here for sick visit for following issues:  1. Chemo induced nausea- will add zyprexa 10 mg qhs ODT for nausea. He will stop his zofran and try prn compazine instead. We will give him 8 mg IV zofran and 10 mg IV decadron today  2. Dehydration- 1L NS to be given today. Repeat cmp tomorrow and give 1 more L IVF along with nausea meds  3. Diarrhea- ongoing since starting J tube feeds. Improves when he goes down on the rate to 70. Advised to him to take BID lomotil. He will stop it if no BM for 2 days.   He will get cycle 3 of carbo/taxol on 02/06/17 as planned. He will see me on 11/27 with cbc, cmp     Visit Diagnosis 1. Malignant neoplasm of lower third of esophagus (HCC)   2. Chemotherapy-induced nausea   3. Dehydration   4. Diarrhea, unspecified type      Dr. Randa Evens, MD, MPH [facility] Pager- [phone] 01/30/2017 1:25 PM

## 2017-01-30 NOTE — Telephone Encounter (Signed)
Add to Dr Janese Banks @ 10 a.m. per Doni/staff msg.

## 2017-01-31 ENCOUNTER — Inpatient Hospital Stay: Payer: Medicare Other | Admitting: Oncology

## 2017-01-31 ENCOUNTER — Inpatient Hospital Stay: Payer: Medicare Other

## 2017-01-31 ENCOUNTER — Ambulatory Visit: Payer: Medicare Other

## 2017-01-31 ENCOUNTER — Ambulatory Visit
Admission: RE | Admit: 2017-01-31 | Discharge: 2017-01-31 | Disposition: A | Discharge status: Home / Self Care | Payer: Medicare Other | Source: Ambulatory Visit | Attending: Radiation Oncology | Admitting: Radiation Oncology

## 2017-01-31 VITALS — BP 133/81 | HR 91 | Temp 95.1°F | Resp 20

## 2017-01-31 DIAGNOSIS — C155 Malignant neoplasm of lower third of esophagus: Secondary | ICD-10-CM | POA: Diagnosis not present

## 2017-01-31 DIAGNOSIS — Z5111 Encounter for antineoplastic chemotherapy: Secondary | ICD-10-CM | POA: Diagnosis not present

## 2017-01-31 DIAGNOSIS — E860 Dehydration: Secondary | ICD-10-CM

## 2017-01-31 LAB — CBC WITH DIFFERENTIAL/PLATELET
Basophils Absolute: 0 10*3/uL (ref 0–0.1)
Basophils Relative: 1 %
EOS ABS: 0 10*3/uL (ref 0–0.7)
EOS PCT: 0 %
HCT: 39.7 % — ABNORMAL LOW (ref 40.0–52.0)
HEMOGLOBIN: 13.3 g/dL (ref 13.0–18.0)
LYMPHS ABS: 0.5 10*3/uL — AB (ref 1.0–3.6)
Lymphocytes Relative: 15 %
MCH: 30.5 pg (ref 26.0–34.0)
MCHC: 33.5 g/dL (ref 32.0–36.0)
MCV: 91.1 fL (ref 80.0–100.0)
MONOS PCT: 6 %
Monocytes Absolute: 0.2 10*3/uL (ref 0.2–1.0)
NEUTROS PCT: 78 %
Neutro Abs: 2.7 10*3/uL (ref 1.4–6.5)
Platelets: 221 10*3/uL (ref 150–440)
RBC: 4.36 MIL/uL — ABNORMAL LOW (ref 4.40–5.90)
RDW: 14.1 % (ref 11.5–14.5)
WBC: 3.5 10*3/uL — ABNORMAL LOW (ref 3.8–10.6)

## 2017-01-31 LAB — COMPREHENSIVE METABOLIC PANEL
ALK PHOS: 188 U/L — AB (ref 38–126)
ALT: 452 U/L — ABNORMAL HIGH (ref 17–63)
ANION GAP: 8 (ref 5–15)
AST: 68 U/L — ABNORMAL HIGH (ref 15–41)
Albumin: 3.3 g/dL — ABNORMAL LOW (ref 3.5–5.0)
BUN: 29 mg/dL — ABNORMAL HIGH (ref 6–20)
CALCIUM: 8.1 mg/dL — AB (ref 8.9–10.3)
CO2: 24 mmol/L (ref 22–32)
Chloride: 104 mmol/L (ref 101–111)
Creatinine, Ser: 0.76 mg/dL (ref 0.61–1.24)
Glucose, Bld: 123 mg/dL — ABNORMAL HIGH (ref 65–99)
Potassium: 4 mmol/L (ref 3.5–5.1)
SODIUM: 136 mmol/L (ref 135–145)
TOTAL PROTEIN: 6.5 g/dL (ref 6.5–8.1)
Total Bilirubin: 1.5 mg/dL — ABNORMAL HIGH (ref 0.3–1.2)

## 2017-01-31 MED ORDER — HEPARIN SOD (PORK) LOCK FLUSH 100 UNIT/ML IV SOLN
500.0000 [IU] | Freq: Once | INTRAVENOUS | Status: AC
  Administered 2017-01-31: 500 [IU] via INTRAVENOUS

## 2017-01-31 MED ORDER — SODIUM CHLORIDE 0.9 % IV SOLN
Freq: Once | INTRAVENOUS | Status: AC
  Administered 2017-01-31: 11:00:00 via INTRAVENOUS
  Filled 2017-01-31: qty 4

## 2017-01-31 MED ORDER — SODIUM CHLORIDE 0.9 % IV SOLN
Freq: Once | INTRAVENOUS | Status: AC
  Administered 2017-01-31: 10:00:00 via INTRAVENOUS
  Filled 2017-01-31: qty 1000

## 2017-01-31 MED ORDER — HEPARIN SOD (PORK) LOCK FLUSH 100 UNIT/ML IV SOLN
INTRAVENOUS | Status: AC
  Filled 2017-01-31: qty 5

## 2017-01-31 MED ORDER — SODIUM CHLORIDE 0.9% FLUSH
10.0000 mL | Freq: Once | INTRAVENOUS | Status: AC
  Administered 2017-01-31: 10 mL via INTRAVENOUS
  Filled 2017-01-31: qty 10

## 2017-02-05 ENCOUNTER — Ambulatory Visit: Payer: Medicare Other

## 2017-02-05 ENCOUNTER — Ambulatory Visit
Admission: RE | Admit: 2017-02-05 | Discharge: 2017-02-05 | Disposition: A | Discharge status: Home / Self Care | Payer: Medicare Other | Source: Ambulatory Visit | Attending: Radiation Oncology | Admitting: Radiation Oncology

## 2017-02-05 DIAGNOSIS — C155 Malignant neoplasm of lower third of esophagus: Secondary | ICD-10-CM | POA: Diagnosis not present

## 2017-02-06 ENCOUNTER — Telehealth: Payer: Self-pay | Admitting: *Deleted

## 2017-02-06 ENCOUNTER — Inpatient Hospital Stay: Payer: Medicare Other

## 2017-02-06 ENCOUNTER — Ambulatory Visit: Payer: Medicare Other

## 2017-02-06 ENCOUNTER — Inpatient Hospital Stay (HOSPITAL_BASED_OUTPATIENT_CLINIC_OR_DEPARTMENT_OTHER): Payer: Medicare Other | Admitting: Oncology

## 2017-02-06 ENCOUNTER — Encounter: Payer: Self-pay | Admitting: Oncology

## 2017-02-06 ENCOUNTER — Ambulatory Visit
Admission: RE | Admit: 2017-02-06 | Discharge: 2017-02-06 | Disposition: A | Discharge status: Home / Self Care | Payer: Medicare Other | Source: Ambulatory Visit | Attending: Radiation Oncology | Admitting: Radiation Oncology

## 2017-02-06 VITALS — BP 139/84 | HR 114 | Temp 98.0°F | Resp 14 | Wt 182.0 lb

## 2017-02-06 DIAGNOSIS — E860 Dehydration: Secondary | ICD-10-CM | POA: Diagnosis not present

## 2017-02-06 DIAGNOSIS — R599 Enlarged lymph nodes, unspecified: Secondary | ICD-10-CM | POA: Diagnosis not present

## 2017-02-06 DIAGNOSIS — R197 Diarrhea, unspecified: Secondary | ICD-10-CM

## 2017-02-06 DIAGNOSIS — R7989 Other specified abnormal findings of blood chemistry: Secondary | ICD-10-CM | POA: Diagnosis not present

## 2017-02-06 DIAGNOSIS — C155 Malignant neoplasm of lower third of esophagus: Secondary | ICD-10-CM | POA: Diagnosis not present

## 2017-02-06 DIAGNOSIS — T451X5A Adverse effect of antineoplastic and immunosuppressive drugs, initial encounter: Secondary | ICD-10-CM

## 2017-02-06 DIAGNOSIS — R110 Nausea: Secondary | ICD-10-CM

## 2017-02-06 DIAGNOSIS — K9419 Other complications of enterostomy: Secondary | ICD-10-CM | POA: Diagnosis not present

## 2017-02-06 DIAGNOSIS — R5383 Other fatigue: Secondary | ICD-10-CM

## 2017-02-06 DIAGNOSIS — K222 Esophageal obstruction: Secondary | ICD-10-CM

## 2017-02-06 DIAGNOSIS — Z79899 Other long term (current) drug therapy: Secondary | ICD-10-CM

## 2017-02-06 DIAGNOSIS — R945 Abnormal results of liver function studies: Secondary | ICD-10-CM

## 2017-02-06 DIAGNOSIS — Z5111 Encounter for antineoplastic chemotherapy: Secondary | ICD-10-CM

## 2017-02-06 LAB — CBC WITH DIFFERENTIAL/PLATELET
BASOS ABS: 0.1 10*3/uL (ref 0–0.1)
BASOS PCT: 1 %
EOS PCT: 0 %
Eosinophils Absolute: 0 10*3/uL (ref 0–0.7)
HEMATOCRIT: 37.5 % — AB (ref 40.0–52.0)
Hemoglobin: 12.7 g/dL — ABNORMAL LOW (ref 13.0–18.0)
LYMPHS PCT: 15 %
Lymphs Abs: 0.8 10*3/uL — ABNORMAL LOW (ref 1.0–3.6)
MCH: 30.2 pg (ref 26.0–34.0)
MCHC: 33.8 g/dL (ref 32.0–36.0)
MCV: 89.1 fL (ref 80.0–100.0)
MONO ABS: 0.8 10*3/uL (ref 0.2–1.0)
MONOS PCT: 16 %
NEUTROS ABS: 3.5 10*3/uL (ref 1.4–6.5)
Neutrophils Relative %: 68 %
PLATELETS: 176 10*3/uL (ref 150–440)
RBC: 4.21 MIL/uL — ABNORMAL LOW (ref 4.40–5.90)
RDW: 14.3 % (ref 11.5–14.5)
WBC: 5.2 10*3/uL (ref 3.8–10.6)

## 2017-02-06 LAB — COMPREHENSIVE METABOLIC PANEL
ALBUMIN: 3 g/dL — AB (ref 3.5–5.0)
ALT: 123 U/L — ABNORMAL HIGH (ref 17–63)
AST: 28 U/L (ref 15–41)
Alkaline Phosphatase: 169 U/L — ABNORMAL HIGH (ref 38–126)
Anion gap: 9 (ref 5–15)
BUN: 22 mg/dL — AB (ref 6–20)
CALCIUM: 8.1 mg/dL — AB (ref 8.9–10.3)
CO2: 24 mmol/L (ref 22–32)
Chloride: 102 mmol/L (ref 101–111)
Creatinine, Ser: 0.73 mg/dL (ref 0.61–1.24)
GFR calc Af Amer: >60 mL/min (ref >60)
GFR calc non Af Amer: >60 mL/min (ref >60)
GLUCOSE: 116 mg/dL — AB (ref 65–99)
Potassium: 4.1 mmol/L (ref 3.5–5.1)
SODIUM: 135 mmol/L (ref 135–145)
TOTAL PROTEIN: 6.5 g/dL (ref 6.5–8.1)
Total Bilirubin: 1 mg/dL (ref 0.3–1.2)

## 2017-02-06 MED ORDER — SODIUM CHLORIDE 0.9 % IV SOLN
195.0000 mg | Freq: Once | INTRAVENOUS | Status: AC
  Administered 2017-02-06: 200 mg via INTRAVENOUS
  Filled 2017-02-06: qty 20

## 2017-02-06 MED ORDER — SODIUM CHLORIDE 0.9 % IV SOLN
20.0000 mg | Freq: Once | INTRAVENOUS | Status: AC
  Administered 2017-02-06: 20 mg via INTRAVENOUS
  Filled 2017-02-06: qty 2

## 2017-02-06 MED ORDER — SODIUM CHLORIDE 0.9 % IV SOLN
Freq: Once | INTRAVENOUS | Status: AC
  Administered 2017-02-06: 12:00:00 via INTRAVENOUS
  Filled 2017-02-06: qty 1000

## 2017-02-06 MED ORDER — FAMOTIDINE IN NACL 20-0.9 MG/50ML-% IV SOLN
20.0000 mg | Freq: Once | INTRAVENOUS | Status: AC
  Administered 2017-02-06: 20 mg via INTRAVENOUS
  Filled 2017-02-06: qty 50

## 2017-02-06 MED ORDER — HEPARIN SOD (PORK) LOCK FLUSH 100 UNIT/ML IV SOLN
500.0000 [IU] | Freq: Once | INTRAVENOUS | Status: AC | PRN
  Administered 2017-02-06: 500 [IU]
  Filled 2017-02-06: qty 5

## 2017-02-06 MED ORDER — PALONOSETRON HCL INJECTION 0.25 MG/5ML
0.2500 mg | Freq: Once | INTRAVENOUS | Status: AC
  Administered 2017-02-06: 0.25 mg via INTRAVENOUS
  Filled 2017-02-06: qty 5

## 2017-02-06 MED ORDER — DEXAMETHASONE 0.5 MG/5ML PO ELIX: 8.0000 mg | ORAL_SOLUTION | Freq: Every day | ORAL | 3 refills | Status: DC

## 2017-02-06 MED ORDER — PACLITAXEL CHEMO INJECTION 300 MG/50ML
50.0000 mg/m2 | Freq: Once | INTRAVENOUS | Status: AC
  Administered 2017-02-06: 102 mg via INTRAVENOUS
  Filled 2017-02-06: qty 17

## 2017-02-06 MED ORDER — DIPHENHYDRAMINE HCL 50 MG/ML IJ SOLN
50.0000 mg | Freq: Once | INTRAMUSCULAR | Status: AC
  Administered 2017-02-06: 25 mg via INTRAVENOUS
  Filled 2017-02-06: qty 1

## 2017-02-06 NOTE — Progress Notes (Signed)
Patient states, "50mg  of Benadryl makes me loopy and jumpy. Can we please cut that in half?" MD, Dr. Janese Banks, notified via telephone. Per MD order: only give patient Benadryl 25mg  IV once today.

## 2017-02-06 NOTE — Progress Notes (Signed)
Patient here for follow up with labs today. He states that he has been feeling better since he got the fluids last week. He reports continued vomiting, especially during the night. He states that last night it was really bad, and he is concerned because his tube has come out about an inch or more over the last week, and his skin is irritated and red around the tube.

## 2017-02-06 NOTE — Progress Notes (Signed)
Hematology/Oncology Consult note [facility]  [phone] [phone]  Patient Care Team: Idelle Crouch, MD as PCP - General (Internal Medicine) Clent Jacks, RN as Registered Nurse Grace Isaac, MD as Consulting Physician (Cardiothoracic Surgery) Sindy Guadeloupe, MD as Consulting Physician (Oncology) Noreene Filbert, MD as Referring Physician (Radiation Oncology)   Name of the patient: Casey Acevedo  [id number]  January 22, 1941   Date of visit: 02/06/17  Diagnosis- non metastatic esophageal cancer [id number] [facility]   Chief complaint/ Reason for visit-1 treatment assessment prior to cycle #3 of weekly carbotaxol  Heme/Onc history: 1. Patient is a 76 year old gentleman with no significant past medical history and takes no medications he has a remote history of smokingduring his college days and quit smoking thereafter. No history of any alcohol intake. He was recently admitted to the hospital on 12/06/2016 with symptoms of progressive dysphagia which he states has been going on since the starting of September. His dysphagia got to the point that he began to have pain even on swallowing icecream. He underwent EGD at that time which showed but high 2 cm long stricture 2 cm above the GE junction. Biopsies at that time were negative for malignancy. Given that he was unable to eat he was started on TPN at that time and plan was to get a repeat EGD with possible biopsy  2. Patient underwent repeat EGD on 12/26/2016 where he was again noted to have a lower esophageal stricture which reduced the lumen to less than 3-4 mm. There was no evidence of Barrett's esophagus. 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 Shortly after the stricture was dilated the stricture did close up significantly. Postprocedure patient complained of abdominal pain and there  was a concern for perforation and hence a repeat CT abdomen was obtained which did not show any evidence of perforation  3. CT abdomen pelvis as well as CT chest with contrast on 10/01/2018showed: 7 mm sub pleural nodule in the lingula. No evidence of axillary mediastinal or hilar adenopathy.1.3 cm soft tissue nodule adjacent to the GE junctionwhich is favored to represent an enlarged lymph node potentially active or metastatic in etiology. Narrowing involving thickening of the distal esophagus compatible with known distal esophageal stricture  4. Patient has been referred to [place] for further management. He is tolerating his TPN well and otherwise reports no complaints. He lives with his wife and is independent of his ADLs and IADLs. Besides dysphagia patient reports no loss of appetite or unintentional weight loss over the last few months. He denies any pain  5.[place] been deemed to be a potential surgical candidate in the future. Plan for now is to proceed with concurrent chemoradiation followed by EUS upon completion of treatment given difficulty with the  second EGD  6. Chemo/RT started on 01/19/17    Interval history-nausea has significantly improved after he started taking the Zyprexa.  Although he did have a bout of nausea and vomiting yesterday night.  He is getting his G-tube feeds at 80 cc an hour but he is been using it for about 18 hours a day instead of 23 hours a day.  Reports that his diarrhea is under good control with Lomotil and he has not needed to use it for the last 3 days.  He is concerned that his J-tube  appears a little father out than before.  At this time he continues to use it without any difficulty.  His weight has remained stable  ECOG PS- 1 Pain scale- 0 Opioid associated constipation- no  Review of systems- Review of Systems  Constitutional: Positive for malaise/fatigue. Negative for  chills, fever and weight loss.  HENT: Negative for congestion, ear discharge and nosebleeds.   Eyes: Negative for blurred vision.  Respiratory: Negative for cough, hemoptysis, sputum production, shortness of breath and wheezing.   Cardiovascular: Negative for chest pain, palpitations, orthopnea and claudication.  Gastrointestinal: Positive for nausea. Negative for abdominal pain, blood in stool, constipation, diarrhea, heartburn, melena and vomiting.  Genitourinary: Negative for dysuria, flank pain, frequency, hematuria and urgency.  Musculoskeletal: Negative for back pain, joint pain and myalgias.  Skin: Negative for rash.  Neurological: Negative for dizziness, tingling, focal weakness, seizures, weakness and headaches.  Endo/Heme/Allergies: Does not bruise/bleed easily.  Psychiatric/Behavioral: Negative for depression and suicidal ideas. The patient does not have insomnia.       No Known Allergies   Past Medical History:  Diagnosis Date  . Cancer (HCC)    esophageal  . Dysphagia   . GERD (gastroesophageal reflux disease)      Past Surgical History:  Procedure Laterality Date  . CHOLECYSTECTOMY    . ESOPHAGOGASTRODUODENOSCOPY (EGD) WITH PROPOFOL N/A 12/07/2016   Procedure: ESOPHAGOGASTRODUODENOSCOPY (EGD) WITH PROPOFOL;  Surgeon: Jonathon Bellows, MD;  Location: [facility] ENDOSCOPY;  Service: Gastroenterology;  Laterality: N/A;  . ESOPHAGOGASTRODUODENOSCOPY (EGD) WITH PROPOFOL N/A 12/26/2016   Procedure: ESOPHAGOGASTRODUODENOSCOPY (EGD) WITH PROPOFOL WITH DILATION;  Surgeon: Jonathon Bellows, MD;  Location: [facility] ENDOSCOPY;  Service: Gastroenterology;  Laterality: N/A;  . EYE SURGERY    . GASTROJEJUNOSTOMY N/A 01/16/2017   Procedure: LAPROSCOPIC ASSIST FEEDING JEJUNOSTOMY TUBE;  Surgeon: Johnathan Hausen, MD;  Location: [facility] ORS;  Service: General;  Laterality: N/A;  . IR FLUORO GUIDE PORT INSERTION RIGHT  01/10/2017  . PICC LINE INSERTION Right     Social History   Socioeconomic History  .  Marital status: Married    Spouse name: Not on file  . Number of children: Not on file  . Years of education: Not on file  . Highest education level: Not on file  Social Needs  . Financial resource strain: Not on file  . Food insecurity - worry: Not on file  . Food insecurity - inability: Not on file  . Transportation needs - medical: Not on file  . Transportation needs - non-medical: Not on file  Occupational History  . Not on file  Tobacco Use  . Smoking status: Never Smoker  . Smokeless tobacco: Never Used  Substance and Sexual Activity  . Alcohol use: No  . Drug use: No  . Sexual activity: Not Currently  Other Topics Concern  . Not on file  Social History Narrative   Independent at baseline. Ambulatory    Family History  Problem Relation Age of Onset  . Heart failure Father   . Prostate cancer Neg Hx   . Bladder Cancer Neg Hx   . Kidney cancer Neg Hx      Current Outpatient Medications:  .  dexamethasone 0.5 MG/5ML elixir, 80 mLs (8 mg total) by Per J Tube route daily. Start the day after chemotherapy and take for 2 days (after every chemotherapy treatment), Disp: 320 mL, Rfl: 3 .  diphenoxylate-atropine (LOMOTIL) 2.5-0.025 MG tablet, Take 1 tablet 4 (four) times daily as needed by mouth for diarrhea or loose stools., Disp:  120 tablet, Rfl: 0 .  hydrocortisone cream-nystatin cream-zinc oxide, Apply 1 application 4 (four) times daily topically., Disp: 4 oz, Rfl: 3 .  lidocaine-prilocaine (EMLA) cream, Apply to affected area once, Disp: 30 g, Rfl: 3 .  LORazepam (ATIVAN) 0.5 MG tablet, Take 1 tablet (0.5 mg total) by mouth every 6 (six) hours as needed (Nausea or vomiting). (Patient not taking: Reported on 01/26/2017), Disp: 30 tablet, Rfl: 0 .  Nutritional Supplements (FEEDING SUPPLEMENT, VITAL 1.5 CAL,) LIQD, Give vital 1.5 via continuous pump at 14ml/hr for 22 hours.  Flush with 256ml of water before starting tube feeding and after stopping tube feeding.  Provide  additional 243ml of water 2 other times during waking hours via J tube., Disp: 1540 mL, Rfl: 12 .  OLANZapine (ZYPREXA) 10 MG tablet, Take 1 tablet (10 mg total) by mouth at bedtime., Disp: 30 tablet, Rfl: 2 .  ondansetron (ZOFRAN ODT) 4 MG disintegrating tablet, Take 1 tablet (4 mg total) by mouth every 8 (eight) hours as needed for nausea or vomiting. (Patient not taking: Reported on 01/26/2017), Disp: 30 tablet, Rfl: 0 .  ondansetron (ZOFRAN) 8 MG tablet, Take 1 tablet (8 mg total) by mouth 2 (two) times daily as needed for refractory nausea / vomiting. Start on day 3 after chemo., Disp: 30 tablet, Rfl: 1 .  prochlorperazine (COMPAZINE) 10 MG tablet, Take 1 tablet (10 mg total) by mouth every 6 (six) hours as needed (Nausea or vomiting). (Patient not taking: Reported on 01/26/2017), Disp: 30 tablet, Rfl: 1 No current facility-administered medications for this visit.   Facility-Administered Medications Ordered in Other Visits:  .  0.9 %  sodium chloride infusion, , Intravenous, Once, Sindy Guadeloupe, MD .  ondansetron (ZOFRAN) 8 mg, dexamethasone (DECADRON) 10 mg in sodium chloride 0.9 % 50 mL IVPB, , Intravenous, Once, Sindy Guadeloupe, MD  Physical exam:  Vitals:   02/06/17 1049 02/06/17 1054  BP:  139/84  Pulse:  (!) 114  Resp:  14  Temp:  98 F (36.7 C)  TempSrc:  Tympanic  Weight: 182 lb (82.6 kg)    Physical Exam  Constitutional: He is oriented to person, place, and time and well-developed, well-nourished, and in no distress.  HENT:  Head: Normocephalic and atraumatic.  Eyes: EOM are normal. Pupils are equal, round, and reactive to light.  Neck: Normal range of motion.  Cardiovascular: Regular rhythm and normal heart sounds.  Tachycardic  Pulmonary/Chest: Effort normal and breath sounds normal.  Abdominal: Soft. Bowel sounds are normal.  J-tube in place  Neurological: He is alert and oriented to person, place, and time.  Skin: Skin is warm and dry.     CMP Latest Ref Rng &  Units 02/06/2017  Glucose 65 - 99 mg/dL 116(H)  BUN 6 - 20 mg/dL 22(H)  Creatinine 0.61 - 1.24 mg/dL 0.73  Sodium 135 - 145 mmol/L 135  Potassium 3.5 - 5.1 mmol/L 4.1  Chloride 101 - 111 mmol/L 102  CO2 22 - 32 mmol/L 24  Calcium 8.9 - 10.3 mg/dL 8.1(L)  Total Protein 6.5 - 8.1 g/dL 6.5  Total Bilirubin 0.3 - 1.2 mg/dL 1.0  Alkaline Phos 38 - 126 U/L 169(H)  AST 15 - 41 U/L 28  ALT 17 - 63 U/L 123(H)   CBC Latest Ref Rng & Units 02/06/2017  WBC 3.8 - 10.6 K/uL 5.2  Hemoglobin 13.0 - 18.0 g/dL 12.7(L)  Hematocrit 40.0 - 52.0 % 37.5(L)  Platelets 150 - 440 K/uL 176  No images are attached to the encounter.  US Liver Doppler  Result Date: 01/23/2017 CLINICAL DATA:  Esophageal cancer EXAM: DUPLEX ULTRASOUND OF LIVER TECHNIQUE: Color and duplex Doppler ultrasound was performed to evaluate the hepatic in-flow and out-flow vessels. COMPARISON:  PET-CT 01/03/2017 FINDINGS: Portal Vein Velocities Main:  41 cm/sec Right:  28 cm/sec Left:  22 cm/sec Hepatic Vein Velocities Right:  17 cm/sec Middle:  26 cm/sec Left:  23 cm/sec Hepatic Artery Velocity:  82 cm/sec Splenic Vein Velocity:  21 cm/sec Varices: Absent Ascites: Absent Benign-appearing cyst in the left lobe is stable compare with the prior imaging study. Portal vein is patent with hepatopetal flow. Hepatic veins are patent with hepatofugal flow. Splenic vein is also patent with directionality towards the portal vein. IMPRESSION: Hepatic, portal, and splenic veins are patent with normal directionality of flow. Electronically Signed   By: Marybelle Killings M.D.   On: 01/23/2017 07:46   Ir Fluoro Guide Port Insertion Right  Result Date: 01/10/2017 CLINICAL DATA:  Esophageal carcinoma EXAM: RIGHT INTERNAL JUGULAR SINGLE LUMEN POWER PORT CATHETER INSERTION Date:  10/31/201810/31/2018 3:46 pm Radiologist:  M. Daryll Brod, MD Guidance:  Ultrasound and fluoroscopic MEDICATIONS: Ancef 2 g; The antibiotic was administered within an appropriate time  interval prior to skin puncture. ANESTHESIA/SEDATION: Versed 2.0 mg IV; Fentanyl 50 mcg IV; Moderate Sedation Time:  27 minutes The patient was continuously monitored during the procedure by the interventional radiology nurse under my direct supervision. FLUOROSCOPY TIME:  One minutes, 12 seconds (30 mGy) COMPLICATIONS: None immediate. CONTRAST:  None. PROCEDURE: Informed consent was obtained from the patient following explanation of the procedure, risks, benefits and alternatives. The patient understands, agrees and consents for the procedure. All questions were addressed. A time out was performed. Maximal barrier sterile technique utilized including caps, mask, sterile gowns, sterile gloves, large sterile drape, hand hygiene, and 2% chlorhexidine scrub. Under sterile conditions and local anesthesia, right internal jugular micropuncture venous access was performed. Access was performed with ultrasound. Images were obtained for documentation. A guide wire was inserted followed by a transitional dilator. This allowed insertion of a guide wire and catheter into the IVC. Measurements were obtained from the SVC / RA junction back to the right IJ venotomy site. In the right infraclavicular chest, a subcutaneous pocket was created over the second anterior rib. This was done under sterile conditions and local anesthesia. 1% lidocaine with epinephrine was utilized for this. A 2.5 cm incision was made in the skin. Blunt dissection was performed to create a subcutaneous pocket over the right pectoralis major muscle. The pocket was flushed with saline vigorously. There was adequate hemostasis. The port catheter was assembled and checked for leakage. The port catheter was secured in the pocket with two retention sutures. The tubing was tunneled subcutaneously to the right venotomy site and inserted into the SVC/RA junction through a valved peel-away sheath. Position was confirmed with fluoroscopy. Images were obtained for  documentation. The patient tolerated the procedure well. No immediate complications. Incisions were closed in a two layer fashion with 4 - 0 Vicryl suture. Dermabond was applied to the skin. The port catheter was accessed, blood was aspirated followed by saline and heparin flushes. Needle was removed. A dry sterile dressing was applied. IMPRESSION: Ultrasound and fluoroscopically guided right internal jugular single lumen power port catheter insertion. Tip in the SVC/RA junction. Catheter ready for use. Electronically Signed   By: Jerilynn Mages.  Shick M.D.   On: 01/10/2017 15:56     Assessment and plan-  Patient is a 76 y.o. male non metastatic esophageal cancer [id number] [facility]  year for on treatment assessment prior to cycle #3 of carbo Taxol  1.  Cycle #3 of chemotherapy was delayed last week due to Thanksgiving week.  He will resume chemotherapy today and will proceed with cycle #3 of chemotherapy.  He will proceed for cycle #4 of carbo Taxol next week and I will see him back in 2 weeks time for cycle 5 of carbo Taxol.  CBC CMP to be checked each week  2.  Chemo-induced nausea: Well controlled with Zyprexa and as needed Compazine  3.  Diarrhea possibly due to J-tube feeds: Well controlled with Lomotil  4.  Abnormal LFTs secondary to TPN.  Bilirubin has normalized and AST ALT as well as alkaline phosphatase is trending down.  Continue to monitor  5.  Concern about J-tube.  Patient will be seeing Dr. Servando Snare in 2 days time.  I have asked the patient to speak to him.  Patient will also measure the length of the J-tube that is out and if it starts coming out further out we will refer him back to surgery for repositioning    Visit Diagnosis 1. Malignant neoplasm of lower third of esophagus (HCC)   2. Encounter for antineoplastic chemotherapy   3. Chemotherapy-induced nausea   4. Abnormal LFTs      Dr. Randa Evens, MD, MPH [facility] at [facility] Pager- [phone] 02/06/2017 12:00  PM

## 2017-02-06 NOTE — Telephone Encounter (Signed)
Pt in waiting room and I did get pt appt for 11/29 arrive at 8:40 so surgeon can check his J tube.  The tubing has come out 3-4 inches. Pt. Knows where the office is located and wrote address as well as phone number if needed

## 2017-02-07 ENCOUNTER — Ambulatory Visit
Admission: RE | Admit: 2017-02-07 | Discharge: 2017-02-07 | Disposition: A | Discharge status: Home / Self Care | Payer: Medicare Other | Source: Ambulatory Visit | Attending: Radiation Oncology | Admitting: Radiation Oncology

## 2017-02-07 ENCOUNTER — Telehealth: Payer: Self-pay | Admitting: *Deleted

## 2017-02-07 ENCOUNTER — Ambulatory Visit: Payer: Medicare Other

## 2017-02-07 DIAGNOSIS — C155 Malignant neoplasm of lower third of esophagus: Secondary | ICD-10-CM | POA: Diagnosis not present

## 2017-02-07 NOTE — Telephone Encounter (Signed)
Requesting an order to discontinue TPN fax to [phone]

## 2017-02-08 ENCOUNTER — Ambulatory Visit (INDEPENDENT_AMBULATORY_CARE_PROVIDER_SITE_OTHER): Payer: Medicare Other | Admitting: Cardiothoracic Surgery

## 2017-02-08 ENCOUNTER — Inpatient Hospital Stay: Payer: Medicare Other

## 2017-02-08 ENCOUNTER — Ambulatory Visit: Payer: Medicare Other

## 2017-02-08 ENCOUNTER — Other Ambulatory Visit: Payer: Self-pay

## 2017-02-08 ENCOUNTER — Ambulatory Visit
Admission: RE | Admit: 2017-02-08 | Discharge: 2017-02-08 | Disposition: A | Discharge status: Home / Self Care | Payer: Medicare Other | Source: Ambulatory Visit | Attending: Radiation Oncology | Admitting: Radiation Oncology

## 2017-02-08 ENCOUNTER — Encounter: Payer: Self-pay | Admitting: Cardiothoracic Surgery

## 2017-02-08 VITALS — BP 126/80 | HR 100 | Ht 69.0 in | Wt 180.0 lb

## 2017-02-08 DIAGNOSIS — C155 Malignant neoplasm of lower third of esophagus: Secondary | ICD-10-CM | POA: Diagnosis not present

## 2017-02-08 NOTE — Telephone Encounter (Signed)
I faxed it today. We were not the ordering md for TPN but I originally wrote order to taper off TPN and taper to J tube feeding.  Since I sent that order we are just going to send the d/c order. He is on all tube feeding now and no TPn

## 2017-02-08 NOTE — Progress Notes (Signed)
[address]       [place] [address]             [phone]                    Casey Acevedo [place] Medical Record #[id number] Date of Birth: 1940-08-04  Referring: Casey Guadeloupe, MD Primary Care: Casey Crouch, MD  Chief Complaint: Advanced stage esophageal cancer  History of Present Illness:    Casey Acevedo 76 y.o. male is seen in the office  today for evaluation of recent diagnosis of esophageal cancer.  The patient has a 3 month history of dysphagia .  Patient.  Complains approximately 10 pound weight loss .  He underwent endoscopy at the end of September with negative biopsy for malignancy .  He was started on home TNA at that time .  October 16 repeat endoscopy with dilatation was performed .  The patient notes severe chest pain following the dilatation he said "as closeto death as possible".  Repeat scans showed no evidence of perforation.  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  Treated with PICC and TPN.    Patient denies any lifelong problem with reflux, he does note that his 49 year old brother is alive with history of Barrett's Previous surgery includes only laparoscopic cholecystectomy in 2009  Patient has very few in the way of long-term medical problems, notes he takes no prescription medications, has had no previous cardiac history. He smoked briefly while in college but none for more than 50 years.  Denies any alcohol intake for at least the last 25 years.   The patient has started on radiation and chemotherapy he is to complete his course of radiation December 21.  He notes that he has become dehydrated and required intravenous fluid during his therapy.  He notes now that over the overall the past week things are improving, he notes that he is able to take p.o. liquids up to 8 ounces at a time without difficulty.  He continues on jejunostomy tube feedings.  He has had problems with irritation around  his jejunostomy tube.  Current Activity/ Functional Status:  Patient is independent with mobility/ambulation, transfers, ADL's, IADL's.   Zubrod Score: At the time of surgery this patient's most appropriate activity status/level should be described as: []     0    Normal activity, no symptoms [x]     1    Restricted in physical strenuous activity but ambulatory, able to do out light work []     2    Ambulatory and capable of self care, unable to do work activities, up and about               >50 % of waking hours                              []     3    Only limited self care, in bed greater than 50% of waking hours []     4    Completely disabled, no self care, confined to bed or chair []     5    Moribund   Past Medical History:  Diagnosis Date  . Cancer (HCC)    esophageal  . Dysphagia   . GERD (gastroesophageal reflux disease)     Past Surgical History:  Procedure Laterality Date  . CHOLECYSTECTOMY    .  ESOPHAGOGASTRODUODENOSCOPY (EGD) WITH PROPOFOL N/A 12/07/2016   Procedure: ESOPHAGOGASTRODUODENOSCOPY (EGD) WITH PROPOFOL;  Surgeon: Casey Bellows, MD;  Location[facility] ENDOSCOPY;  Service: Gastroenterology;  Laterality: N/A;  . ESOPHAGOGASTRODUODENOSCOPY (EGD) WITH PROPOFOL N/A 12/26/2016   Procedure: ESOPHAGOGASTRODUODENOSCOPY (EGD) WITH PROPOFOL WITH DILATION;  Surgeon: Casey Bellows, MD;  Location: [facility] ENDOSCOPY;  Service: Gastroenterology;  Laterality: N/A;  . EYE SURGERY    . GASTROJEJUNOSTOMY N/A 01/16/2017   Procedure: LAPROSCOPIC ASSIST FEEDING JEJUNOSTOMY TUBE;  Surgeon: Casey Hausen, MD;  Location: [facility] ORS;  Service: General;  Laterality: N/A;  . IR FLUORO GUIDE PORT INSERTION RIGHT  01/10/2017  . PICC LINE INSERTION Right     Family History  Problem Relation Age of Onset  . Heart failure Father   . Prostate cancer Neg Hx   . Bladder Cancer Neg Hx   . Kidney cancer Neg Hx    Patient spotted at age 41 with complications after gallbladder surgery, mother died at age 23  with a perforated gastric ulcer and history of breast cancer, he has 1 brother at age 22 with alive with heart trouble and diabetes and also known Barrett's esophagus.  One brother died in the [place] in 66 at age 54 of myocardial infarction.  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  No Known Allergies  Current Outpatient Medications  Medication Sig Dispense Refill  . dexamethasone 0.5 MG/5ML elixir 80 mLs (8 mg total) by Per J Tube route daily. Start the day after chemotherapy and take for 2 days (after every chemotherapy treatment) 320 mL 3  . diphenoxylate-atropine (LOMOTIL) 2.5-0.025 MG tablet Take 1 tablet 4 (four) times daily as needed by mouth for diarrhea or loose stools. 120 tablet 0  . hydrocortisone cream-nystatin cream-zinc oxide Apply 1 application 4 (four) times daily topically. 4 oz 3  . Nutritional Supplements (FEEDING SUPPLEMENT, VITAL 1.5 CAL,) LIQD Give vital 1.5 via continuous pump at 74ml/hr for 22 hours.  Flush with 217ml of water before starting tube feeding and after stopping tube feeding.  Provide additional 221ml of water 2 other times during waking hours via J tube. 1540 mL 12  . OLANZapine (ZYPREXA) 10 MG tablet Take 1 tablet (10 mg total) by mouth at bedtime. 30 tablet 2  . ondansetron (ZOFRAN) 8 MG tablet Take 1 tablet (8 mg total) by mouth 2 (two) times daily as needed for refractory nausea / vomiting. Start on day 3 after chemo. 30 tablet 1   No current facility-administered medications for this visit.    Facility-Administered Medications Ordered in Other Visits  Medication Dose Route  Frequency Provider Last Rate Last Dose  . 0.9 %  sodium chloride infusion   Intravenous Once Casey Guadeloupe, MD      . ondansetron (ZOFRAN) 8 mg, dexamethasone (DECADRON) 10 mg in sodium chloride 0.9 % 50 mL IVPB   Intravenous Once Casey Guadeloupe, MD        Pertinent items are noted in HPI.   Review of Systems:     Cardiac Review of Systems: Y or N  Chest Pain [  y ]  Resting SOB [ n  ] Exertional SOB  [ n ]  Orthopnea [n  ]   Pedal Edema [ n  ]    Palpitations [n  ] Syncope  [n  ]   Presyncope [n   ]  General Review of Systems: [Y] = yes [  ]=no Constitional: recent weight change Blue.Reese  ];  Wt loss over the last 3 months [ 10  ] anorexia [  ]; fatigue [  ]; nausea [  ]; night sweats [  ]; fever [  ]; or chills [  ];          Dental: poor dentition[  ]; Last Dentist visit:   Eye : blurred vision [  ]; diplopia [   ]; vision changes [  ];  Amaurosis fugax[  ]; Resp: cough [n  ];  wheezing[ n ];  hemoptysis[n  ]; shortness of breath[ n ]; paroxysmal nocturnal dyspnea[ n ]; dyspnea on exertion[  ]; or orthopnea[  ];  GI:  Gallstones[y removed  ], vomiting[ y ];  dysphagia[ y ]; melena[ n ];  hematochezia [  ]; heartburn[ n ];   Hx of  Colonoscopy[ n ]; GU: kidney stones [  ]; hematuria[  ];   dysuria [  ];  nocturia[  ];  history of     obstruction [  ]; urinary frequency [  ]             Skin: rash, swelling[  ];, hair loss[  ];  peripheral edema[  ];  or itching[  ]; Musculosketetal: myalgias[  ];  joint swelling[  ];  joint erythema[  ];  joint pain[  ];  back pain[  ];  Heme/Lymph: bruising[  ];  bleeding[  ];  anemia[  ];  Neuro: TIA[n  ];  headaches[ n ];  stroke[  n];  vertigo[  ];  seizures[  ];   paresthesias[  ];  difficulty walking[  ];  Psych:depression[  ]; anxiety[  ];  Endocrine: diabetes[ n ];  thyroid dysfunction[ n ];  Immunizations: Flu up to date [  ]; Pneumococcal up to date [  ];  Other:  Physical Exam: BP 126/80   Pulse 100   Ht 5\' 9"  (1.753 m)   Wt 180 lb (81.6 kg)    SpO2 98% Comment: on RA  BMI 26.58 kg/m   PHYSICAL EXAMINATION: General appearance: alert, cooperative, appears stated age and no distress Head: Normocephalic, without obvious abnormality, atraumatic Neck: no adenopathy, no carotid bruit, no JVD, supple, symmetrical, trachea midline and thyroid not enlarged, symmetric, no tenderness/mass/nodules Lymph nodes: Cervical, supraclavicular, and axillary nodes normal. Resp: clear to auscultation bilaterally Back: symmetric, no curvature. ROM normal. No CVA tenderness. Cardio: regular rate and rhythm, S1, S2 normal, no murmur, click, rub or gallop GI: soft, non-tender; bowel sounds normal; no masses,  no organomegaly and Jejunostomy tube is in place there is a significant amount of erythema around it he notes that it was just sutured in place by general surgery yesterday Extremities: extremities normal, atraumatic, no cyanosis or edema and Homans sign is negative, no sign of DVT Neurologic: Grossly normal  Diagnostic Studies & Laboratory data:     Recent Radiology Findings:   Nm Pet Image Initial (pi) Skull Base To Thigh  Result Date: 01/03/2017 CLINICAL DATA:  Initial treatment strategy for esophageal cancer. EXAM: NUCLEAR MEDICINE PET SKULL BASE TO THIGH TECHNIQUE: Twelve point for mCi F-18 FDG was  injected intravenously. Full-ring PET imaging was performed from the skull base to thigh after the radiotracer. CT data was obtained and used for attenuation correction and anatomic localization. FASTING BLOOD GLUCOSE:  Value: 127 mg/dl COMPARISON:  CT chest abdomen pelvis 12/26/2016. FINDINGS: NECK: No hypermetabolic lymph nodes in the neck. CT images show no acute findings. CHEST: No hypermetabolic mediastinal, hilar or axillary lymph nodes. Distal esophageal mass measures approximately 2.5 x 2.7 cm with an SUV max of 8.0. No adjacent hypermetabolic adenopathy. No hypermetabolic pulmonary nodules. Atherosclerotic calcification of the arterial  vasculature, including coronary arteries. Right PICC tip terminates at the SVC RA junction. Heart is mildly enlarged. No pericardial or pleural effusion. Esophagus is dilated throughout its course, to the level of the above-described esophageal mass. ABDOMEN/PELVIS: No abnormal hypermetabolism in the liver, adrenal glands, spleen or pancreas. No hypermetabolic lymph nodes. Subcentimeter low-attenuation lesion in the dome of the liver is too small to characterize. Cholecystectomy. Adrenal glands, kidneys, spleen, pancreas, stomach and bowel are grossly unremarkable. Atherosclerotic calcification of the arterial vasculature without aneurysm. Prostate is mildly enlarged. No free fluid. SKELETON: No abnormal osseous hypermetabolism. IMPRESSION: 1. Hypermetabolic distal esophageal mass with associated esophageal dilatation. No hypermetabolic adenopathy or distant metastatic disease. 2. Aortic atherosclerosis (ICD10-170.0). Coronary artery calcification. Electronically Signed   By: Lorin Picket M.D.   On: 01/03/2017 14:44   Ct Chest Wo Contrast/ Ct Abdomen Wo Contrast  Result Date: 12/26/2016 CLINICAL DATA:  Esophageal cancer. Gastroesophageal reflux disease. Dysphagia. Endoscopy from earlier today demonstrating esophageal stenosis at approximately 40 cm from the incisors. Rule out perforation of esophagus. EXAM: CT CHEST, ABDOMEN WITH CONTRAST TECHNIQUE: Multidetector CT imaging of the chest, abdomen was performed following the standard protocol during bolus administration of intravenous contrast. CONTRAST:  75 cc of Isovue-300 COMPARISON:  Esophagram of earlier today.  Prior CTs of 12/11/2016. FINDINGS: CT CHEST FINDINGS Cardiovascular: Aortic and branch vessel atherosclerosis. Normal heart size, without pericardial effusion. Multivessel coronary artery atherosclerosis. A right-sided PICC line terminates at the high right atrium. Mediastinum/Nodes: No supraclavicular adenopathy. No mediastinal or definite hilar  adenopathy, given limitations of unenhanced CT. Soft tissue fullness at the distal esophagus including on image 50/series 2. This is similar. The more proximal esophagus is dilated and contrast filled from today's esophagram. No extraluminal contrast identified. No pneumomediastinum. Lungs/Pleura: No pleural fluid. Right upper lobe subpleural calcified granuloma on image 65/series 4. 5 mm lingular nodule on image 97/series 4 is is similar to on the prior. Minimal subpleural left lower lobe nodularity at 3 mm on image 127/series 4. Not readily apparent on the prior, favored to represent a subpleural lymph node. Musculoskeletal: No acute osseous abnormality. CT ABDOMEN  FINDINGS Hepatobiliary: High left hepatic lobe subcentimeter low-density lesion is likely a cyst. Cholecystectomy, without biliary ductal dilatation. Pancreas: Pancreatic atrophy, without duct dilatation or dominant mass. Spleen: Old granulomatous disease in the spleen. Adrenals/Urinary Tract: Normal adrenal glands. No renal calculi or hydronephrosis. Stomach/Bowel: Normal appearance of the stomach. Normal abdominal bowel loops. Vascular/Lymphatic: Aortic and branch vessel atherosclerosis. Gastrohepatic ligament adenopathy is again identified, including at 1.3 cm on image 53/series 2. Other:  No ascites.  No abdominal peritoneal or omental metastasis. Musculoskeletal: No acute osseous abnormality. IMPRESSION: 1. Soft tissue fullness in the distal esophagus, likely representing the primary site of malignancy. This is at least partially obstructive, with contrast from today's esophagram identified within a dilated more superior esophagus. 2. Isolated gastrohepatic ligament adenopathy, highly suspicious for metastatic disease. 3. No evidence of esophageal perforation. 4. Nonspecific  lingular nodule, similar. 5. Coronary artery atherosclerosis. Aortic Atherosclerosis (ICD10-I70.0). Electronically Signed   By: Abigail Miyamoto M.D.   On: 12/26/2016 14:58   Ct  Chest W Contrast Ct Abdomen Pelvis W Contrast  Result Date: 12/11/2016 CLINICAL DATA:  Patient with progressive difficulty swallowing. EXAM: CT CHEST, ABDOMEN, AND PELVIS WITH CONTRAST TECHNIQUE: Multidetector CT imaging of the chest, abdomen and pelvis was performed following the standard protocol during bolus administration of intravenous contrast. CONTRAST:  134mL ISOVUE-300 IOPAMIDOL (ISOVUE-300) INJECTION 61% COMPARISON:  CT abdomen pelvis 05/11/2006 FINDINGS: CT CHEST FINDINGS Cardiovascular: Normal heart size. Coronary arterial vascular calcifications. Thoracic aortic vascular calcifications. Right upper extremity PICC line tip terminates in the superior vena cava. Mediastinum/Nodes: No enlarged axillary, mediastinal or hilar lymphadenopathy. There is wall thickening and narrowing of the distal esophagus (image 50; series 2). Lungs/Pleura: There is a 7 mm subpleural nodule in the lingula (image 96; series 4). Calcified granuloma within the medial right upper lobe (image 63; series 4). Dependent atelectasis/ scarring within the lower lobes bilaterally. No pleural effusion or pneumothorax. Musculoskeletal: Thoracic spine degenerative changes. No aggressive or acute appearing osseous lesions. CT ABDOMEN PELVIS FINDINGS Hepatobiliary: The liver is normal in size and contour. Subcentimeter too small to characterize low-attenuation lesion left hepatic lobe (image 50 ; series 2). Fatty deposition adjacent to the falciform ligament. Status post cholecystectomy. No intrahepatic or extrahepatic biliary ductal dilatation. Pancreas: Mildly atrophic. Spleen: Calcified granulomas in the spleen. Adrenals/Urinary Tract: The adrenal glands are normal. Kidneys enhance symmetrically with contrast. No hydronephrosis. Urinary bladder is unremarkable. Stomach/Bowel: Colon is decompressed, limiting evaluation. Suggestion of mild colonic wall thickening. Normal morphology of the stomach. There is a 1.3 cm soft tissue nodule  adjacent to the GE junction (image 53; series 2). Vascular/Lymphatic: Normal caliber abdominal aorta. Peripheral calcified atherosclerotic plaque. No retroperitoneal lymphadenopathy. Reproductive: Prostate unremarkable. Other: Bilateral fat containing inguinal hernias. Musculoskeletal: Lumbar spine degenerative changes. No aggressive or acute appearing osseous lesions. IMPRESSION: There is a 1.3 cm soft tissue nodule adjacent to the GE junction. This is favored to represent an enlarged lymph node, potentially reactive or metastatic in etiology. The colon is decompressed however there is suggestion of wall thickening, raising the possibility of colitis. There is a 7 mm nodule within the left upper lobe. Non-contrast chest CT at 6-12 months is recommended. If the nodule is stable at time of repeat CT, then future CT at 18-24 months (from today's scan) is considered optional for low-risk patients, but is recommended for high-risk patients. This recommendation follows the consensus statement: Guidelines for Management of Incidental Pulmonary Nodules Detected on CT Images: From the Fleischner Society 2017; Radiology 2017; 284:228-243. Narrowing and wall thickening of the distal esophagus, compatible with known distal esophageal stricture. Electronically Signed   By: Lovey Newcomer M.D.   On: 12/11/2016 20:02   Dg Esophagus  Result Date: 12/06/2016 CLINICAL DATA:  Difficulty swallowing, constant vomiting EXAM: ESOPHOGRAM/BARIUM SWALLOW TECHNIQUE: Single contrast examination was performed using  thick barium. FLUOROSCOPY TIME:  Fluoroscopy Time:  0.5 minute Radiation Exposure Index (if provided by the fluoroscopic device): 5.7 mGy Number of Acquired Spot Images: 0 COMPARISON:  None. FINDINGS: There was normal pharyngeal anatomy and motility. Patient is extremely nauseous. Mild esophageal dilatation with a high-grade stricture of the distal esophagus with irregular margins. IMPRESSION: 1. High-grade stricture of the distal  esophagus with irregular margins. 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 Recommend upper endoscopy. Electronically Signed   By: Kathreen Devoid  On: 12/06/2016 11:53   Dg Esophagus W/water Sol Cm  Result Date: 12/26/2016 CLINICAL DATA:  Recent esophageal instrumentation. Pain. Evaluate for esophageal rupture. EXAM: ESOPHOGRAM/BARIUM SWALLOW TECHNIQUE: Single contrast examination was performed using water-soluble contrast. FLUOROSCOPY TIME:  1 minutes and 6 seconds COMPARISON:  None. FINDINGS: The patient ingested water-soluble contrast and imaging was obtained. There is an obstruction of the distal esophagus, just above the gastroesophageal junction. A fluid level exists in the esophagus. Contrast never traversed beyond the distal esophagus. The patient vomited. There is no evidence of extravasation to suggest leak or rupture. IMPRESSION: No evidence of contrast extravasation to suggest esophageal injury or perforation There is abrupt obstruction of the distal esophagus just above the gastroesophageal junction. Electronically Signed   By: Marybelle Killings M.D.   On: 12/26/2016 14:46     I have independently reviewed the above radiology studies  and reviewed the findings with the patient.   Recent Lab Findings: Lab Results  Component Value Date   WBC 5.2 02/06/2017   HGB 12.7 (L) 02/06/2017   HCT 37.5 (L) 02/06/2017   PLT 176 02/06/2017   GLUCOSE 116 (H) 02/06/2017   TRIG 92 12/11/2016   ALT 123 (H) 02/06/2017   AST 28 02/06/2017   NA 135 02/06/2017   K 4.1 02/06/2017   CL 102 02/06/2017   CREATININE 0.73 02/06/2017   BUN 22 (H) 02/06/2017   CO2 24 02/06/2017   INR 1.04 01/10/2017   Path 12/26/2016  Collected: 12/26/16 1152  Resulting lab: [id number]  Value: Surgical Pathology  CASE: [id number]  PATIENT: Casey Acevedo  Surgical Pathology Report      SPECIMEN SUBMITTED:  A. 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 ESOPHAGUS STRICTURE; COLD BIOPSY:  - SQUAMOUS CELL CARCINOMA.   Comment:  These findings were communicated to Dr. Vicente Males on 12/27/2016.   GROSS DESCRIPTION:   A. Labeled: esophageal stricture C BX   Tissue fragment(s): 3   Size: 0.1-0.4 cm   Description: pink fragments   Entirely submitted in 1 cassette(s).     Final Diagnosis performed by Quay Burow, MD. Electronically signed  12/27/2016 2:05:42PM     The electronic signature indicates that the named Attending Pathologist  has evaluated the specimen   Technical component performed at [facility], [address], [place],  [place] [address]  Lab: [phone] Dir: Darrick Penna. Evette Doffing, MD   Professional component performed at [facility], [facility],  [place], [place],  [address]  Lab: [phone] Dir: Dellia Nims. Rubinas, MD    ENDO:12/26/2016 One severe stenosis was found 38 to 40 cm from the incisors. This measured 6 mm (inner diameter) x 2 cm (in length) and was traversed after downsizing scope and dilating. A guide wire was placed, then the scope was withdrawn. Using the wire as a guide, dilation with an 10-19-08 mm pyloric balloon dilator was performed under fluoroscopic guidance. The dilation site was examined following endoscope reinsertion and showed mild improvement in luminal narrowing. We were unable to pass the adult scope , we subsequently further dilated the stricture with a CRE dilator serially from 10-12 mm and subsequently the adult scope was able to pass through. The stricture still appeared to be very tight and the scope was not able to easily glide past. Biopsies were taken with a cold forceps for histology.  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  The size of the tumor would suggest T3 lesion, likely clinical stage III disease.  He has started this therapy and notes some improvement in his p.o. intake already  He was instructed in local wound care around his jejunostomy tube   We can consider EUS at this point however with 2 previous endoscopies and the difficulty in passing the scope completely into the stomach it is unlikely to fall exam can be completed at this time.  We could consider this at the time of restaging before a final decision about surgery is made.   I will plan to see him back in 1 month to evaluate his progress.  At that time will make arrangements for restaging scans, and depending on findings consider surgical resection 6-8 weeks after the completion of radiation.  Grace Isaac MD      [address] [place] [address] Office [phone]   Beeper [phone]  02/08/2017 3:54 PM

## 2017-02-08 NOTE — Progress Notes (Signed)
Nutrition Follow-up:  Patient with esophageal cancer.  Patient receiving chemotherapy and radiation therapy.  J-tube placed on 11/6 (op note reviewed and 16 red robinson catheter placed).    Met with patient and wife today before radiation therapy.  Patient reports he has felt the best he has felt the last 2 days since all this has started.  Reports he had a good, normal bowel movement this am.  Reports no nausea in the last 2 days.  Did not some nausea reported earlier in the week.  Reports that he is getting in 6 cartons of vital 1.5 tube feeding daily continuous (41m/hr for about 18 hours per day).  Reports that he has been tolerating water flush of 2422mbefore and after feeding and 2 times during the day during waking hours.   Reports that he has been eating ice chips and was able to drink about 8 oz of water yesterday and this am without difficulty.  Patient pleased with progress.  Noted that he received fluids times 2 last week.    Patient reports that he just came from surgeon's office this am and had tube pushed back in as it had pulled out (not completely).  Reports that surgeon repositioned tube and placed C-clamp on tube.    Medications: zyprexa, lorazepam, compazine, dexamethasone, lomotil  Labs: reviewed, liver function improving  Anthropometrics:   Weight taken on 11/27 of 182 lb, stable weight   Estimated Energy Needs  Kcals: 2050-2460 calories/d Protein: 102-123 g/d Fluid: 2.4 L/d  NUTRITION DIAGNOSIS: Inadequate oral intake continues but nutrition being addressed with J-tube feeding   MALNUTRITION DIAGNOSIS: continue to monitor   INTERVENTION:   Patient to continue with vital 1.5 (6 cartons per day) at 8012mr for 18 hours via continuous pump feeding (J-tube).  Patient to continue with 240m40mter flush QID.   Current tube feeding regimen will provide 2133 calories, 96 g of protein and 2040ml2mer (including free water in formula and water flush).   Patient to  continue to try clear liquid food items (ie water, broth, popsicles without fruit pieces, non carbonated beverages).      MONITORING, EVALUATION, GOAL: weight trends, intake, tube feeding tolerance   NEXT VISIT: December 6 before radiation  Jarom Govan B. AllenZenia Resides [place] Hills[place] Dietitian [phone])

## 2017-02-09 ENCOUNTER — Ambulatory Visit
Admission: RE | Admit: 2017-02-09 | Discharge: 2017-02-09 | Disposition: A | Discharge status: Home / Self Care | Payer: Medicare Other | Source: Ambulatory Visit | Attending: Radiation Oncology | Admitting: Radiation Oncology

## 2017-02-09 ENCOUNTER — Ambulatory Visit: Payer: Medicare Other

## 2017-02-09 DIAGNOSIS — C155 Malignant neoplasm of lower third of esophagus: Secondary | ICD-10-CM | POA: Diagnosis not present

## 2017-02-12 ENCOUNTER — Ambulatory Visit: Payer: Medicare Other

## 2017-02-12 ENCOUNTER — Ambulatory Visit
Admission: RE | Admit: 2017-02-12 | Discharge: 2017-02-12 | Disposition: A | Discharge status: Home / Self Care | Payer: Medicare Other | Source: Ambulatory Visit | Attending: Radiation Oncology | Admitting: Radiation Oncology

## 2017-02-12 DIAGNOSIS — C155 Malignant neoplasm of lower third of esophagus: Secondary | ICD-10-CM | POA: Diagnosis not present

## 2017-02-13 ENCOUNTER — Inpatient Hospital Stay: Payer: Medicare Other | Attending: Oncology

## 2017-02-13 ENCOUNTER — Ambulatory Visit
Admission: RE | Admit: 2017-02-13 | Discharge: 2017-02-13 | Disposition: A | Discharge status: Home / Self Care | Payer: Medicare Other | Source: Ambulatory Visit | Attending: Radiation Oncology | Admitting: Radiation Oncology

## 2017-02-13 ENCOUNTER — Ambulatory Visit: Payer: Medicare Other

## 2017-02-13 ENCOUNTER — Ambulatory Visit: Payer: Medicare Other | Admitting: Oncology

## 2017-02-13 ENCOUNTER — Other Ambulatory Visit: Payer: Self-pay

## 2017-02-13 ENCOUNTER — Inpatient Hospital Stay: Payer: Medicare Other

## 2017-02-13 DIAGNOSIS — K909 Intestinal malabsorption, unspecified: Secondary | ICD-10-CM | POA: Diagnosis not present

## 2017-02-13 DIAGNOSIS — K219 Gastro-esophageal reflux disease without esophagitis: Secondary | ICD-10-CM | POA: Insufficient documentation

## 2017-02-13 DIAGNOSIS — Z5111 Encounter for antineoplastic chemotherapy: Secondary | ICD-10-CM | POA: Diagnosis present

## 2017-02-13 DIAGNOSIS — Z934 Other artificial openings of gastrointestinal tract status: Secondary | ICD-10-CM | POA: Diagnosis not present

## 2017-02-13 DIAGNOSIS — Z79899 Other long term (current) drug therapy: Secondary | ICD-10-CM | POA: Diagnosis not present

## 2017-02-13 DIAGNOSIS — C155 Malignant neoplasm of lower third of esophagus: Secondary | ICD-10-CM

## 2017-02-13 DIAGNOSIS — R899 Unspecified abnormal finding in specimens from other organs, systems and tissues: Secondary | ICD-10-CM

## 2017-02-13 DIAGNOSIS — K9423 Gastrostomy malfunction: Secondary | ICD-10-CM | POA: Insufficient documentation

## 2017-02-13 DIAGNOSIS — E860 Dehydration: Secondary | ICD-10-CM | POA: Insufficient documentation

## 2017-02-13 DIAGNOSIS — R197 Diarrhea, unspecified: Secondary | ICD-10-CM | POA: Insufficient documentation

## 2017-02-13 DIAGNOSIS — Z87891 Personal history of nicotine dependence: Secondary | ICD-10-CM | POA: Diagnosis not present

## 2017-02-13 LAB — CBC WITH DIFFERENTIAL/PLATELET
BASOS ABS: 0 10*3/uL (ref 0–0.1)
BASOS PCT: 1 %
EOS ABS: 0 10*3/uL (ref 0–0.7)
EOS PCT: 0 %
HCT: 36.6 % — ABNORMAL LOW (ref 40.0–52.0)
Hemoglobin: 12.6 g/dL — ABNORMAL LOW (ref 13.0–18.0)
LYMPHS PCT: 16 %
Lymphs Abs: 0.7 10*3/uL — ABNORMAL LOW (ref 1.0–3.6)
MCH: 30.2 pg (ref 26.0–34.0)
MCHC: 34.4 g/dL (ref 32.0–36.0)
MCV: 87.9 fL (ref 80.0–100.0)
Monocytes Absolute: 0.3 10*3/uL (ref 0.2–1.0)
Monocytes Relative: 7 %
Neutro Abs: 3 10*3/uL (ref 1.4–6.5)
Neutrophils Relative %: 76 %
PLATELETS: 159 10*3/uL (ref 150–440)
RBC: 4.17 MIL/uL — AB (ref 4.40–5.90)
RDW: 14.3 % (ref 11.5–14.5)
WBC: 4 10*3/uL (ref 3.8–10.6)

## 2017-02-13 LAB — COMPREHENSIVE METABOLIC PANEL
ALT: 65 U/L — ABNORMAL HIGH (ref 17–63)
ANION GAP: 7 (ref 5–15)
AST: 20 U/L (ref 15–41)
Albumin: 2.9 g/dL — ABNORMAL LOW (ref 3.5–5.0)
Alkaline Phosphatase: 141 U/L — ABNORMAL HIGH (ref 38–126)
BUN: 24 mg/dL — ABNORMAL HIGH (ref 6–20)
CHLORIDE: 100 mmol/L — AB (ref 101–111)
CO2: 25 mmol/L (ref 22–32)
Calcium: 8.1 mg/dL — ABNORMAL LOW (ref 8.9–10.3)
Creatinine, Ser: 0.8 mg/dL (ref 0.61–1.24)
GFR calc non Af Amer: >60 mL/min (ref >60)
Glucose, Bld: 112 mg/dL — ABNORMAL HIGH (ref 65–99)
POTASSIUM: 4.2 mmol/L (ref 3.5–5.1)
SODIUM: 132 mmol/L — AB (ref 135–145)
TOTAL PROTEIN: 6.3 g/dL — AB (ref 6.5–8.1)
Total Bilirubin: 0.8 mg/dL (ref 0.3–1.2)

## 2017-02-13 MED ORDER — SODIUM CHLORIDE 0.9 % IV SOLN
20.0000 mg | Freq: Once | INTRAVENOUS | Status: AC
  Administered 2017-02-13: 20 mg via INTRAVENOUS
  Filled 2017-02-13: qty 2

## 2017-02-13 MED ORDER — SODIUM CHLORIDE 0.9 % IV SOLN
Freq: Once | INTRAVENOUS | Status: AC
  Administered 2017-02-13: 11:00:00 via INTRAVENOUS
  Filled 2017-02-13: qty 1000

## 2017-02-13 MED ORDER — DIPHENHYDRAMINE HCL 50 MG/ML IJ SOLN
25.0000 mg | Freq: Once | INTRAMUSCULAR | Status: AC
  Administered 2017-02-13: 25 mg via INTRAVENOUS
  Filled 2017-02-13: qty 1

## 2017-02-13 MED ORDER — FAMOTIDINE IN NACL 20-0.9 MG/50ML-% IV SOLN
20.0000 mg | Freq: Once | INTRAVENOUS | Status: AC
  Administered 2017-02-13: 20 mg via INTRAVENOUS
  Filled 2017-02-13: qty 50

## 2017-02-13 MED ORDER — HEPARIN SOD (PORK) LOCK FLUSH 100 UNIT/ML IV SOLN
500.0000 [IU] | Freq: Once | INTRAVENOUS | Status: AC | PRN
  Administered 2017-02-13: 500 [IU]

## 2017-02-13 MED ORDER — SODIUM CHLORIDE 0.9 % IV SOLN
200.0000 mg | Freq: Once | INTRAVENOUS | Status: AC
  Administered 2017-02-13: 200 mg via INTRAVENOUS
  Filled 2017-02-13: qty 20

## 2017-02-13 MED ORDER — PALONOSETRON HCL INJECTION 0.25 MG/5ML
0.2500 mg | Freq: Once | INTRAVENOUS | Status: AC
  Administered 2017-02-13: 0.25 mg via INTRAVENOUS
  Filled 2017-02-13: qty 5

## 2017-02-13 MED ORDER — PACLITAXEL CHEMO INJECTION 300 MG/50ML
50.0000 mg/m2 | Freq: Once | INTRAVENOUS | Status: AC
  Administered 2017-02-13: 102 mg via INTRAVENOUS
  Filled 2017-02-13: qty 17

## 2017-02-14 ENCOUNTER — Ambulatory Visit: Payer: Medicare Other

## 2017-02-14 ENCOUNTER — Ambulatory Visit
Admission: RE | Admit: 2017-02-14 | Discharge: 2017-02-14 | Disposition: A | Discharge status: Home / Self Care | Payer: Medicare Other | Source: Ambulatory Visit | Attending: Radiation Oncology | Admitting: Radiation Oncology

## 2017-02-14 DIAGNOSIS — C155 Malignant neoplasm of lower third of esophagus: Secondary | ICD-10-CM | POA: Diagnosis not present

## 2017-02-15 ENCOUNTER — Ambulatory Visit
Admission: RE | Admit: 2017-02-15 | Discharge: 2017-02-15 | Disposition: A | Discharge status: Home / Self Care | Payer: Medicare Other | Source: Ambulatory Visit | Attending: Radiation Oncology | Admitting: Radiation Oncology

## 2017-02-15 ENCOUNTER — Ambulatory Visit: Payer: Medicare Other

## 2017-02-15 ENCOUNTER — Inpatient Hospital Stay: Payer: Medicare Other

## 2017-02-15 DIAGNOSIS — C155 Malignant neoplasm of lower third of esophagus: Secondary | ICD-10-CM | POA: Diagnosis not present

## 2017-02-15 NOTE — Progress Notes (Signed)
Nutrition Follow-up:  Patient with esophageal cancer.  Patient receiving chemotherapy and radiation therapy.  J-tube placed on 11/6 (16 red robinson catheter placed).  Patient reports he met with Dr Newman, surgery again on Tuesday of this week stich popped.  Patient reports Dr. Newman added a stich and tube is staying in place better and feels better.    Patient reports feels good and continues to tolerate 6 cartons of vital 1.5 at this time (80ml/hr for 18 hours). Reports he is giving 240ml water via tube 4 times per day.  Also reports that he is drinking at least 2, 16 oz bottles of water daily.  Tried 1/2 cup broth and tolerated well.  Has only tried water and broth since last visit.  Reports no nausea this past week.  Reports bowel movements are normal and no issues with diarrhea.  Overall doing well with tube feeding and patient is pleased with progress.     Medications: reviewed  Labs: reviewed  Anthropometrics:   Weight is stable at 180 lb noted on 11/29.  Patient reports weight is stable as he weighs every day at home.    Estimated Energy Needs  Kcals: 2050-2460 calories/d Protein: 102-123 g/d Fluid: 2.4 L/d  NUTRITION DIAGNOSIS: Inadequate oral intake continues but nutrition being addressed with tube feeding   MALNUTRITION DIAGNOSIS: continue to monitor   INTERVENTION:   Patient to continue with vital 1.5 (6 cartons per day at 80ml/hr for 18 hours. Patient will flush with 240ml of water before starting tube feeding and after stopping tube feeding.  Will give 2 additional 240ml water flush during waking hours.  Patient will continue to drink water via mouth for hydration as well.   Current tube feeding regimen provides 2133 calories, 96 g of protein and 2040ml of water.   Reviewed clear liquid foods to try and full liquids (milk, cream soups with no pieces, sherbet, ice cream, etc.   Provided patient with samples of ensure/boost, ensure clear to try orally.       MONITORING, EVALUATION, GOAL: weight trends, intake, tube feeding tolerance   NEXT VISIT: December 13 before radiation therapy   B. , RD, LDN Registered Dietitian [phone] (pager)     

## 2017-02-16 ENCOUNTER — Ambulatory Visit
Admission: RE | Admit: 2017-02-16 | Discharge: 2017-02-16 | Disposition: A | Discharge status: Home / Self Care | Payer: Medicare Other | Source: Ambulatory Visit | Attending: Radiation Oncology | Admitting: Radiation Oncology

## 2017-02-16 ENCOUNTER — Ambulatory Visit: Payer: Medicare Other

## 2017-02-16 DIAGNOSIS — C155 Malignant neoplasm of lower third of esophagus: Secondary | ICD-10-CM | POA: Diagnosis not present

## 2017-02-19 ENCOUNTER — Ambulatory Visit: Payer: Medicare Other

## 2017-02-20 ENCOUNTER — Ambulatory Visit
Admission: RE | Admit: 2017-02-20 | Discharge: 2017-02-20 | Disposition: A | Discharge status: Home / Self Care | Payer: Medicare Other | Source: Ambulatory Visit | Attending: Radiation Oncology | Admitting: Radiation Oncology

## 2017-02-20 ENCOUNTER — Other Ambulatory Visit: Payer: Self-pay | Admitting: *Deleted

## 2017-02-20 ENCOUNTER — Encounter: Payer: Self-pay | Admitting: Oncology

## 2017-02-20 ENCOUNTER — Inpatient Hospital Stay: Payer: Medicare Other

## 2017-02-20 ENCOUNTER — Inpatient Hospital Stay (HOSPITAL_BASED_OUTPATIENT_CLINIC_OR_DEPARTMENT_OTHER): Payer: Medicare Other | Admitting: Oncology

## 2017-02-20 ENCOUNTER — Ambulatory Visit: Payer: Medicare Other

## 2017-02-20 VITALS — BP 118/78 | HR 120 | Temp 98.4°F | Resp 18 | Wt 180.0 lb

## 2017-02-20 DIAGNOSIS — R197 Diarrhea, unspecified: Secondary | ICD-10-CM

## 2017-02-20 DIAGNOSIS — Z95828 Presence of other vascular implants and grafts: Secondary | ICD-10-CM

## 2017-02-20 DIAGNOSIS — C155 Malignant neoplasm of lower third of esophagus: Secondary | ICD-10-CM | POA: Diagnosis not present

## 2017-02-20 DIAGNOSIS — K521 Toxic gastroenteritis and colitis: Secondary | ICD-10-CM

## 2017-02-20 DIAGNOSIS — E860 Dehydration: Secondary | ICD-10-CM | POA: Diagnosis not present

## 2017-02-20 DIAGNOSIS — Z79899 Other long term (current) drug therapy: Secondary | ICD-10-CM

## 2017-02-20 DIAGNOSIS — R110 Nausea: Secondary | ICD-10-CM

## 2017-02-20 DIAGNOSIS — Z5111 Encounter for antineoplastic chemotherapy: Secondary | ICD-10-CM | POA: Diagnosis not present

## 2017-02-20 DIAGNOSIS — Z87891 Personal history of nicotine dependence: Secondary | ICD-10-CM

## 2017-02-20 DIAGNOSIS — T451X5A Adverse effect of antineoplastic and immunosuppressive drugs, initial encounter: Secondary | ICD-10-CM

## 2017-02-20 LAB — CBC WITH DIFFERENTIAL/PLATELET
Basophils Absolute: 0 10*3/uL (ref 0–0.1)
Basophils Relative: 1 %
EOS ABS: 0 10*3/uL (ref 0–0.7)
EOS PCT: 0 %
HCT: 34.9 % — ABNORMAL LOW (ref 40.0–52.0)
Hemoglobin: 12 g/dL — ABNORMAL LOW (ref 13.0–18.0)
LYMPHS ABS: 1 10*3/uL (ref 1.0–3.6)
Lymphocytes Relative: 27 %
MCH: 30.1 pg (ref 26.0–34.0)
MCHC: 34.4 g/dL (ref 32.0–36.0)
MCV: 87.3 fL (ref 80.0–100.0)
MONO ABS: 0.4 10*3/uL (ref 0.2–1.0)
MONOS PCT: 12 %
Neutro Abs: 2.2 10*3/uL (ref 1.4–6.5)
Neutrophils Relative %: 60 %
PLATELETS: 184 10*3/uL (ref 150–440)
RBC: 3.99 MIL/uL — ABNORMAL LOW (ref 4.40–5.90)
RDW: 14.6 % — ABNORMAL HIGH (ref 11.5–14.5)
WBC: 3.6 10*3/uL — ABNORMAL LOW (ref 3.8–10.6)

## 2017-02-20 LAB — COMPREHENSIVE METABOLIC PANEL
ALT: 41 U/L (ref 17–63)
AST: 24 U/L (ref 15–41)
Albumin: 3 g/dL — ABNORMAL LOW (ref 3.5–5.0)
Alkaline Phosphatase: 122 U/L (ref 38–126)
Anion gap: 9 (ref 5–15)
BUN: 25 mg/dL — AB (ref 6–20)
CHLORIDE: 101 mmol/L (ref 101–111)
CO2: 23 mmol/L (ref 22–32)
CREATININE: 0.77 mg/dL (ref 0.61–1.24)
Calcium: 7.9 mg/dL — ABNORMAL LOW (ref 8.9–10.3)
GLUCOSE: 127 mg/dL — AB (ref 65–99)
Potassium: 3.8 mmol/L (ref 3.5–5.1)
Sodium: 133 mmol/L — ABNORMAL LOW (ref 135–145)
Total Bilirubin: 1 mg/dL (ref 0.3–1.2)
Total Protein: 6.3 g/dL — ABNORMAL LOW (ref 6.5–8.1)

## 2017-02-20 MED ORDER — DEXAMETHASONE 0.5 MG/5ML PO ELIX: 8.0000 mg | ORAL_SOLUTION | Freq: Every day | ORAL | 3 refills | Status: DC

## 2017-02-20 MED ORDER — LOPERAMIDE HCL 2 MG PO CAPS: 2.0000 mg | ORAL_CAPSULE | ORAL | 0 refills | Status: DC | PRN

## 2017-02-20 MED ORDER — ONDANSETRON HCL 4 MG/2ML IJ SOLN
4.0000 mg | Freq: Once | INTRAMUSCULAR | Status: AC
  Administered 2017-02-20: 4 mg via INTRAVENOUS
  Filled 2017-02-20: qty 2

## 2017-02-20 MED ORDER — SODIUM CHLORIDE 0.9 % IV SOLN: Freq: Once | INTRAVENOUS | Status: DC

## 2017-02-20 MED ORDER — DEXAMETHASONE SODIUM PHOSPHATE 10 MG/ML IJ SOLN
10.0000 mg | Freq: Once | INTRAMUSCULAR | Status: AC
  Administered 2017-02-20: 10 mg via INTRAVENOUS
  Filled 2017-02-20: qty 1

## 2017-02-20 MED ORDER — HEPARIN SOD (PORK) LOCK FLUSH 100 UNIT/ML IV SOLN
500.0000 [IU] | Freq: Once | INTRAVENOUS | Status: AC
  Administered 2017-02-20: 500 [IU] via INTRAVENOUS

## 2017-02-20 MED ORDER — SODIUM CHLORIDE 0.9 % IV SOLN
Freq: Once | INTRAVENOUS | Status: AC
  Administered 2017-02-20: 11:00:00 via INTRAVENOUS
  Filled 2017-02-20: qty 1000

## 2017-02-20 NOTE — Progress Notes (Signed)
Patient here for follow up with labs and treatment today. He states that he has had diarrhea for the last week and is very weak. He feels very dehydrated and is unable to keep anything by mouth down. He thinks he needs fluids today.

## 2017-02-20 NOTE — Progress Notes (Signed)
Hematology/Oncology Consult note [facility]  [phone] [phone]  Patient Care Team: Idelle Crouch, MD as PCP - General (Internal Medicine) Clent Jacks, RN as Registered Nurse Grace Isaac, MD as Consulting Physician (Cardiothoracic Surgery) Sindy Guadeloupe, MD as Consulting Physician (Oncology) Noreene Filbert, MD as Referring Physician (Radiation Oncology)   Name of the patient: Casey Acevedo  [id number]  24-May-1940   Date of visit: 02/20/17  Diagnosis- non metastatic esophageal cancer [id number] [facility]   Chief complaint/ Reason for visit- on treatment assessment prior to cycle #5 of weekly carbotaxol  Heme/Onc history: 1. Patient is a 75 year old gentleman with no significant past medical history and takes no medications he has a remote history of smokingduring his college days and quit smoking thereafter. No history of any alcohol intake. He was recently admitted to the hospital on 12/06/2016 with symptoms of progressive dysphagia which he states has been going on since the starting of September. His dysphagia got to the point that he began to have pain even on swallowing icecream. He underwent EGD at that time which showed but high 2 cm long stricture 2 cm above the GE junction. Biopsies at that time were negative for malignancy. Given that he was unable to eat he was started on TPN at that time and plan was to get a repeat EGD with possible biopsy  2. Patient underwent repeat EGD on 12/26/2016 where he was again noted to have a lower esophageal stricture which reduced the lumen to less than 3-4 mm. There was no evidence of Barrett's esophagus. 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 Shortly after the stricture was dilated the stricture did close up significantly. Postprocedure patient complained of abdominal pain and  there was a concern for perforation and hence a repeat CT abdomen was obtained which did not show any evidence of perforation  3. CT abdomen pelvis as well as CT chest with contrast on 10/01/2018showed: 7 mm sub pleural nodule in the lingula. No evidence of axillary mediastinal or hilar adenopathy.1.3 cm soft tissue nodule adjacent to the GE junctionwhich is favored to represent an enlarged lymph node potentially active or metastatic in etiology. Narrowing involving thickening of the distal esophagus compatible with known distal esophageal stricture  4. Patient has been referred to [place] for further management. He is tolerating his TPN well and otherwise reports no complaints. He lives with his wife and is independent of his ADLs and IADLs. Besides dysphagia patient reports no loss of appetite or unintentional weight loss over the last few months. He denies any pain  5.[place] has been deemed to be a potential surgical candidate in the future. Plan for now is to proceed with concurrent chemoradiation followed by EUS upon completion of treatment given difficulty with the  second EGD  6. Chemo/RT started on 01/19/17  . Interval history-patient felt well up until a few days ago.  He was tolerating his tube feeds well without any significant diarrhea and was able to swallow better.  However his diarrhea has returned since the last few days and currently patient feels weak and lightheaded.  He has been using Lomotil every 6 hours as needed but that has not been helping.  He also tried to decrease the rate of his tube feeds but that did not help the diarrhea as well.  ECOG PS- 1 Pain scale- 0   Review of systems- Review of Systems  Constitutional: Positive for malaise/fatigue. Negative for chills, fever and weight loss.  HENT: Negative for congestion, ear discharge and nosebleeds.   Eyes: Negative for blurred vision.    Respiratory: Negative for cough, hemoptysis, sputum production, shortness of breath and wheezing.   Cardiovascular: Negative for chest pain, palpitations, orthopnea and claudication.  Gastrointestinal: Positive for diarrhea and nausea. Negative for abdominal pain, blood in stool, constipation, heartburn, melena and vomiting.  Genitourinary: Negative for dysuria, flank pain, frequency, hematuria and urgency.  Musculoskeletal: Negative for back pain, joint pain and myalgias.  Skin: Negative for rash.  Neurological: Positive for weakness. Negative for dizziness, tingling, focal weakness, seizures and headaches.  Endo/Heme/Allergies: Does not bruise/bleed easily.  Psychiatric/Behavioral: Negative for depression and suicidal ideas. The patient does not have insomnia.       No Known Allergies   Past Medical History:  Diagnosis Date  . Cancer (HCC)    esophageal  . Dysphagia   . GERD (gastroesophageal reflux disease)      Past Surgical History:  Procedure Laterality Date  . CHOLECYSTECTOMY    . ESOPHAGOGASTRODUODENOSCOPY (EGD) WITH PROPOFOL N/A 12/07/2016   Procedure: ESOPHAGOGASTRODUODENOSCOPY (EGD) WITH PROPOFOL;  Surgeon: Jonathon Bellows, MD;  Location[facility] ENDOSCOPY;  Service: Gastroenterology;  Laterality: N/A;  . ESOPHAGOGASTRODUODENOSCOPY (EGD) WITH PROPOFOL N/A 12/26/2016   Procedure: ESOPHAGOGASTRODUODENOSCOPY (EGD) WITH PROPOFOL WITH DILATION;  Surgeon: Jonathon Bellows, MD;  [facility] END[facility]COPY;  Service: Gastroenterology;  Laterality: N/A;  . EYE SURGERY    . GASTROJEJUNOSTOMY N/A 01/16/2017   Procedure: LAPROSCOPIC ASSIST FEEDING JEJUNOSTOMY TUBE;  Surgeon: Johnathan Hausen, MD;  Location: [facility] ORS;  Service: General;  Laterality: N/A;  . IR FLUORO GUIDE PORT INSERTION RIGHT  01/10/2017  . PICC LINE INSERTION Right     Social History   Socioeconomic History  . Marital status: Married    Spouse name: Not on file  . Number of children: Not on file  . Years of education: Not  on file  . Highest education level: Not on file  Social Needs  . Financial resource strain: Not on file  . Food insecurity - worry: Not on file  . Food insecurity - inability: Not on file  . Transportation needs - medical: Not on file  . Transportation needs - non-medical: Not on file  Occupational History  . Not on file  Tobacco Use  . Smoking status: Never Smoker  . Smokeless tobacco: Never Used  Substance and Sexual Activity  . Alcohol use: No  . Drug use: No  . Sexual activity: Not Currently  Other Topics Concern  . Not on file  Social History Narrative   Independent at baseline. Ambulatory    Family History  Problem Relation Age of Onset  . Heart failure Father   . Prostate cancer Neg Hx   . Bladder Cancer Neg Hx   . Kidney cancer Neg Hx      Current Outpatient Medications:  .  dexamethasone 0.5 MG/5ML elixir, 80 mLs (8 mg total) by Per J Tube route daily. Start the day after chemotherapy and take for 2 days (after every chemotherapy treatment), Disp: 320 mL, Rfl: 3 .  diphenoxylate-atropine (LOMOTIL) 2.5-0.025 MG tablet, Take 1 tablet 4 (four) times daily as needed by mouth for diarrhea or loose stools., Disp: 120 tablet, Rfl: 0 .  hydrocortisone cream-nystatin cream-zinc oxide, Apply 1 application 4 (four) times daily topically., Disp: 4 oz, Rfl: 3 .  lidocaine-prilocaine (EMLA) cream, APPLY TO AFFECTED AREA ONCE, Disp: , Rfl: 3 .  Nutritional Supplements (FEEDING SUPPLEMENT, VITAL 1.5 CAL,) LIQD, Give vital 1.5 via continuous pump at 89ml/hr for 22 hours.  Flush with 296ml of water before starting tube feeding and after stopping tube feeding.  Provide additional 254ml of water 2 other times during waking hours via J tube., Disp: 1540 mL, Rfl: 12 .  OLANZapine (ZYPREXA) 10 MG tablet, Take 1 tablet (10 mg total) by mouth at bedtime., Disp: 30 tablet, Rfl: 2 .  ondansetron (ZOFRAN) 8 MG tablet, Take 1 tablet (8 mg total) by mouth 2 (two) times daily as needed for  refractory nausea / vomiting. Start on day 3 after chemo., Disp: 30 tablet, Rfl: 1 .  ondansetron (ZOFRAN-ODT) 4 MG disintegrating tablet, TAKE 1 TABLET BY MOUTH EVERY 8 HOURS AS NEEDED FOR NAUSEA AND VOMITING, Disp: , Rfl: 0 .  prochlorperazine (COMPAZINE) 10 MG tablet, TAKE 1 TABLET (10 MG TOTAL) BY MOUTH EVERY 6 (SIX) HOURS AS NEEDED (NAUSEA OR VOMITING)., Disp: , Rfl: 1 .  loperamide (IMODIUM) 2 MG capsule, Take 1 capsule (2 mg total) by mouth as needed for diarrhea or loose stools (take two tablests after the first loose stool, then 1 tablet after each loose stool)., Disp: 30 capsule, Rfl: 0 No current facility-administered medications for this visit.   Facility-Administered Medications Ordered in Other Visits:  .  0.9 %  sodium chloride infusion, , Intravenous, Once, Sindy Guadeloupe, MD .  0.9 %  sodium chloride infusion, , Intravenous, Once, Sindy Guadeloupe, MD .  ondansetron (ZOFRAN) 8 mg, dexamethasone (DECADRON) 10 mg in sodium chloride 0.9 % 50 mL IVPB, , Intravenous, Once, Sindy Guadeloupe, MD  Physical exam:  Vitals:   02/20/17 0959 02/20/17 1003 02/20/17 1004  BP: 118/78    Pulse: (!) 120    Resp:   18  Temp: 98.4 F (36.9 C)    TempSrc: Tympanic    Weight: 180 lb (81.6 kg) 180 lb (81.6 kg)    Physical Exam  Constitutional: He is oriented to person, place, and time and well-developed, well-nourished, and in no distress.  HENT:  Head: Normocephalic and atraumatic.  Eyes: EOM are normal. Pupils are equal, round, and reactive to light.  Neck: Normal range of motion.  Cardiovascular: Regular rhythm and normal heart sounds.  Tachycardic  Pulmonary/Chest: Effort normal and breath sounds normal.  Abdominal: Soft. Bowel sounds are normal.  J-tube in place  Neurological: He is alert and oriented to person, place, and time.  Skin: Skin is warm and dry.     CMP Latest Ref Rng & Units 02/20/2017  Glucose 65 - 99 mg/dL 127(H)  BUN 6 - 20 mg/dL 25(H)  Creatinine 0.61 - 1.24 mg/dL  0.77  Sodium 135 - 145 mmol/L 133(L)  Potassium 3.5 - 5.1 mmol/L 3.8  Chloride 101 - 111 mmol/L 101  CO2 22 - 32 mmol/L 23  Calcium 8.9 - 10.3 mg/dL 7.9(L)  Total Protein 6.5 - 8.1 g/dL 6.3(L)  Total Bilirubin 0.3 - 1.2 mg/dL 1.0  Alkaline Phos 38 - 126 U/L 122  AST 15 - 41 U/L 24  ALT 17 - 63 U/L 41   CBC Latest Ref Rng & Units 02/20/2017  WBC 3.8 - 10.6 K/uL 3.6(L)  Hemoglobin 13.0 - 18.0 g/dL 12.0(L)  Hematocrit 40.0 - 52.0 % 34.9(L)  Platelets 150 - 440 K/uL 184    No images are attached to the encounter.  US Liver Doppler  Result Date: 01/23/2017 CLINICAL DATA:  Esophageal cancer EXAM: DUPLEX ULTRASOUND OF LIVER TECHNIQUE: Color and duplex Doppler ultrasound was performed to evaluate the hepatic in-flow and out-flow vessels. COMPARISON:  PET-CT 01/03/2017 FINDINGS: Portal Vein Velocities Main:  41 cm/sec Right:  28 cm/sec Left:  22 cm/sec Hepatic Vein Velocities Right:  17 cm/sec Middle:  26 cm/sec Left:  23 cm/sec Hepatic Artery Velocity:  82 cm/sec Splenic Vein Velocity:  21 cm/sec Varices: Absent Ascites: Absent Benign-appearing cyst in the left lobe is stable compare with the prior imaging study. Portal vein is patent with hepatopetal flow. Hepatic veins are patent with hepatofugal flow. Splenic vein is also patent with directionality towards the portal vein. IMPRESSION: Hepatic, portal, and splenic veins are patent with normal directionality of flow. Electronically Signed   By: Marybelle Killings M.D.   On: 01/23/2017 07:46     Assessment and plan- Patient is a 76 y.o. male non metastatic esophageal cancer [id number] SCC  here for on treatment assessment prior to cycle #5 of weekly carbotaxol  Diarrhea: Patient had no issues with diarrhea for the last 2 weeks when he was on his tube feeds.  I also spoke to Cyndi Bender from nutrition who states that he is currently on a low osmolality tube feed that is suitable for malabsorption  Patients.  Given that patient has difficulty swallowing  he cannot take fiber to thicken his stool either.  I will check stool for C. difficile today.  Diarrhea could be from chemotherapy: If negative we can proceed with chemotherapy today or tomorrow.  I will switch him from Lomotil to Imodium which he can take with each bowel movement and see if that improves his diarrhea.  I will also plan to give him 1 L of normal saline today tomorrow and day after.  If his diarrhea does not improve by 02/22/2017, I will have him see Dr. Vicente Males on 02/23/2017  Esophageal cancer: Counts okay to proceed with weekly carbotaxol today or tomorrow after C. difficile results are back  Abnormal LFTs: Resolved after stopping TPN  Dehydration: Secondary to diarrhea.  He is not hypokalemic today I will proceed with 3 days of normal saline  Chemo induced nausea: Controlled with Zyprexa and as needed Compazine    Visit Diagnosis 1. Malignant neoplasm of lower third of esophagus (HCC)   2. Chemotherapy induced diarrhea   3. Encounter for antineoplastic chemotherapy      Dr. Randa Evens, MD, MPH [facility] Pager- [phone] 02/20/2017 10:53 AM

## 2017-02-21 ENCOUNTER — Inpatient Hospital Stay: Payer: Medicare Other

## 2017-02-21 ENCOUNTER — Ambulatory Visit: Payer: Medicare Other

## 2017-02-21 ENCOUNTER — Ambulatory Visit
Admission: RE | Admit: 2017-02-21 | Discharge: 2017-02-21 | Disposition: A | Discharge status: Home / Self Care | Payer: Medicare Other | Source: Ambulatory Visit | Attending: Radiation Oncology | Admitting: Radiation Oncology

## 2017-02-21 VITALS — BP 131/82 | HR 90 | Temp 97.0°F | Resp 18 | Wt 180.0 lb

## 2017-02-21 DIAGNOSIS — Z5111 Encounter for antineoplastic chemotherapy: Secondary | ICD-10-CM | POA: Diagnosis not present

## 2017-02-21 DIAGNOSIS — C155 Malignant neoplasm of lower third of esophagus: Secondary | ICD-10-CM | POA: Diagnosis not present

## 2017-02-21 MED ORDER — HEPARIN SOD (PORK) LOCK FLUSH 100 UNIT/ML IV SOLN
INTRAVENOUS | Status: AC
  Filled 2017-02-21: qty 5

## 2017-02-21 MED ORDER — FAMOTIDINE IN NACL 20-0.9 MG/50ML-% IV SOLN
20.0000 mg | Freq: Once | INTRAVENOUS | Status: AC
  Administered 2017-02-21: 20 mg via INTRAVENOUS
  Filled 2017-02-21: qty 50

## 2017-02-21 MED ORDER — CARBOPLATIN CHEMO INJECTION 450 MG/45ML
195.0000 mg | Freq: Once | INTRAVENOUS | Status: AC
  Administered 2017-02-21: 200 mg via INTRAVENOUS
  Filled 2017-02-21: qty 20

## 2017-02-21 MED ORDER — SODIUM CHLORIDE 0.9 % IV SOLN
20.0000 mg | Freq: Once | INTRAVENOUS | Status: AC
  Administered 2017-02-21: 20 mg via INTRAVENOUS
  Filled 2017-02-21: qty 2

## 2017-02-21 MED ORDER — DIPHENHYDRAMINE HCL 50 MG/ML IJ SOLN
25.0000 mg | Freq: Once | INTRAMUSCULAR | Status: AC
  Administered 2017-02-21: 25 mg via INTRAVENOUS
  Filled 2017-02-21: qty 1

## 2017-02-21 MED ORDER — PALONOSETRON HCL INJECTION 0.25 MG/5ML
0.2500 mg | Freq: Once | INTRAVENOUS | Status: AC
  Administered 2017-02-21: 0.25 mg via INTRAVENOUS
  Filled 2017-02-21: qty 5

## 2017-02-21 MED ORDER — HEPARIN SOD (PORK) LOCK FLUSH 100 UNIT/ML IV SOLN
500.0000 [IU] | Freq: Once | INTRAVENOUS | Status: AC | PRN
  Administered 2017-02-21: 500 [IU]

## 2017-02-21 MED ORDER — SODIUM CHLORIDE 0.9 % IV SOLN
50.0000 mg/m2 | Freq: Once | INTRAVENOUS | Status: AC
  Administered 2017-02-21: 102 mg via INTRAVENOUS
  Filled 2017-02-21: qty 17

## 2017-02-21 MED ORDER — SODIUM CHLORIDE 0.9 % IV SOLN
Freq: Once | INTRAVENOUS | Status: AC
Start: 2017-02-21 — End: 2017-02-21
  Administered 2017-02-21: 11:00:00 via INTRAVENOUS
  Filled 2017-02-21: qty 1000

## 2017-02-21 MED ORDER — SODIUM CHLORIDE 0.9 % IV SOLN
Freq: Once | INTRAVENOUS | Status: AC
  Administered 2017-02-21: 11:00:00 via INTRAVENOUS
  Filled 2017-02-21: qty 1000

## 2017-02-22 ENCOUNTER — Ambulatory Visit
Admission: RE | Admit: 2017-02-22 | Discharge: 2017-02-22 | Disposition: A | Discharge status: Home / Self Care | Payer: Medicare Other | Source: Ambulatory Visit | Attending: Radiation Oncology | Admitting: Radiation Oncology

## 2017-02-22 ENCOUNTER — Ambulatory Visit: Payer: Medicare Other

## 2017-02-22 ENCOUNTER — Inpatient Hospital Stay: Payer: Medicare Other

## 2017-02-22 DIAGNOSIS — Z95828 Presence of other vascular implants and grafts: Secondary | ICD-10-CM

## 2017-02-22 DIAGNOSIS — C155 Malignant neoplasm of lower third of esophagus: Secondary | ICD-10-CM | POA: Diagnosis not present

## 2017-02-22 DIAGNOSIS — E860 Dehydration: Secondary | ICD-10-CM

## 2017-02-22 DIAGNOSIS — Z5111 Encounter for antineoplastic chemotherapy: Secondary | ICD-10-CM | POA: Diagnosis not present

## 2017-02-22 MED ORDER — HEPARIN SOD (PORK) LOCK FLUSH 100 UNIT/ML IV SOLN
500.0000 [IU] | Freq: Once | INTRAVENOUS | Status: AC
  Administered 2017-02-22: 500 [IU] via INTRAVENOUS
  Filled 2017-02-22: qty 5

## 2017-02-22 MED ORDER — SODIUM CHLORIDE 0.9 % IV SOLN
Freq: Once | INTRAVENOUS | Status: AC
  Administered 2017-02-22: 11:00:00 via INTRAVENOUS
  Filled 2017-02-22: qty 1000

## 2017-02-22 MED ORDER — SODIUM CHLORIDE 0.9% FLUSH
10.0000 mL | INTRAVENOUS | Status: DC | PRN
  Administered 2017-02-22: 10 mL via INTRAVENOUS
  Filled 2017-02-22: qty 10

## 2017-02-22 NOTE — Progress Notes (Signed)
Nutrition Follow-up:  Patient with esophageal cancer.  Patient receiving chemotherapy and radiation therapy.  J-tube placed on 11/6 (16 red robinson catheter placed).    Patient reports normal bowel movement this am and things are back to normal.  Reports over the weekend had more than 3 watery bowel movements that kept him up during the night that started on Saturday until Tuesday.  Took imodium and received IV fluids and has felt much better.  Has not given stool specimen.  Reports tried broth, water and little ice cream over the weekend but felt nausea and throw up.  ? If related to chemotherapy.  Patient reports he really has felt better after getting fluids and does not want to change anything in tube feeding regimen at this time.    No nausea today.   Patient received IV fluids today, yesterday and on Tuesday.   Medications: reviewed  Labs: Na 133, glucose 127, hgb 12.0, K WNL  Anthropometrics:   Weight stable at 180 lb   Estimated Energy Needs  Kcals: 2050-2460 calories/d Protein: 102-123 g/d Fluid: 2.4 L/d  NUTRITION DIAGNOSIS: Inadequate oral intake continues but nutrition being addressed with tube feeding   MALNUTRITION DIAGNOSIS: continue to monitor   INTERVENTION:   Continue vital 1.5 (peptide based formula for malabsorption, GI intolerance, diarrhea) 6 cartons per day with 289ml water flush QID.  Patient reports oral intake has decreased this past week due to nausea, vomiting and diarrhea.  Drank 1/2 cup of water this am and tolerated well.  Encouraged patient to take nausea medication when taking oral liquids to help relieve symptoms.  Discussed being proactive with coming to clinic and getting fluids as needed.  Patient and wife to keep track of how he is feeling this weekend.   Discussed banatrol plus as option for diarrhea.  Patient can give via J-tube or drink orally.  Reviewed how to give medication via tube.  Patient can order online or from pharmacy.  Patient  not planning to start at this time.  Also gave samples of pedilyte to mix with water to try.      MONITORING, EVALUATION, GOAL: weight trends, intake, tube feeding tolerance   NEXT VISIT: December 20 th  Casey Acevedo, [place], [place] Registered Dietitian [phone] (pager)

## 2017-02-23 ENCOUNTER — Ambulatory Visit
Admission: RE | Admit: 2017-02-23 | Discharge: 2017-02-23 | Disposition: A | Discharge status: Home / Self Care | Payer: Medicare Other | Source: Ambulatory Visit | Attending: Radiation Oncology | Admitting: Radiation Oncology

## 2017-02-23 ENCOUNTER — Ambulatory Visit: Payer: Medicare Other

## 2017-02-23 DIAGNOSIS — C155 Malignant neoplasm of lower third of esophagus: Secondary | ICD-10-CM | POA: Diagnosis not present

## 2017-02-25 ENCOUNTER — Other Ambulatory Visit: Payer: Self-pay

## 2017-02-25 ENCOUNTER — Encounter: Payer: Self-pay | Admitting: Emergency Medicine

## 2017-02-25 ENCOUNTER — Emergency Department: Payer: Medicare Other

## 2017-02-25 ENCOUNTER — Inpatient Hospital Stay
Admission: EM | Admit: 2017-02-25 | Discharge: 2017-02-26 | DRG: 872 | Disposition: A | Discharge status: Home / Self Care | Payer: Medicare Other | Attending: Internal Medicine | Admitting: Internal Medicine

## 2017-02-25 DIAGNOSIS — C155 Malignant neoplasm of lower third of esophagus: Secondary | ICD-10-CM | POA: Diagnosis present

## 2017-02-25 DIAGNOSIS — Z79899 Other long term (current) drug therapy: Secondary | ICD-10-CM

## 2017-02-25 DIAGNOSIS — K9412 Enterostomy infection: Secondary | ICD-10-CM | POA: Diagnosis present

## 2017-02-25 DIAGNOSIS — L03311 Cellulitis of abdominal wall: Secondary | ICD-10-CM

## 2017-02-25 DIAGNOSIS — Z923 Personal history of irradiation: Secondary | ICD-10-CM

## 2017-02-25 DIAGNOSIS — Y838 Other surgical procedures as the cause of abnormal reaction of the patient, or of later complication, without mention of misadventure at the time of the procedure: Secondary | ICD-10-CM | POA: Diagnosis present

## 2017-02-25 DIAGNOSIS — A419 Sepsis, unspecified organism: Secondary | ICD-10-CM | POA: Diagnosis present

## 2017-02-25 DIAGNOSIS — K9419 Other complications of enterostomy: Secondary | ICD-10-CM | POA: Diagnosis not present

## 2017-02-25 DIAGNOSIS — Z9221 Personal history of antineoplastic chemotherapy: Secondary | ICD-10-CM

## 2017-02-25 DIAGNOSIS — K219 Gastro-esophageal reflux disease without esophagitis: Secondary | ICD-10-CM | POA: Diagnosis present

## 2017-02-25 DIAGNOSIS — D709 Neutropenia, unspecified: Secondary | ICD-10-CM | POA: Diagnosis not present

## 2017-02-25 DIAGNOSIS — Y833 Surgical operation with formation of external stoma as the cause of abnormal reaction of the patient, or of later complication, without mention of misadventure at the time of the procedure: Secondary | ICD-10-CM | POA: Diagnosis not present

## 2017-02-25 LAB — COMPREHENSIVE METABOLIC PANEL
ALBUMIN: 2.9 g/dL — AB (ref 3.5–5.0)
ALK PHOS: 98 U/L (ref 38–126)
ALT: 42 U/L (ref 17–63)
AST: 24 U/L (ref 15–41)
Anion gap: 9 (ref 5–15)
BILIRUBIN TOTAL: 1.4 mg/dL — AB (ref 0.3–1.2)
BUN: 29 mg/dL — AB (ref 6–20)
CO2: 24 mmol/L (ref 22–32)
Calcium: 8.5 mg/dL — ABNORMAL LOW (ref 8.9–10.3)
Chloride: 103 mmol/L (ref 101–111)
Creatinine, Ser: 0.66 mg/dL (ref 0.61–1.24)
GFR calc Af Amer: >60 mL/min (ref >60)
GFR calc non Af Amer: >60 mL/min (ref >60)
GLUCOSE: 146 mg/dL — AB (ref 65–99)
POTASSIUM: 4.4 mmol/L (ref 3.5–5.1)
Sodium: 136 mmol/L (ref 135–145)
TOTAL PROTEIN: 5.9 g/dL — AB (ref 6.5–8.1)

## 2017-02-25 LAB — CBC WITH DIFFERENTIAL/PLATELET
BASOS ABS: 0 10*3/uL (ref 0–0.1)
Basophils Relative: 0 %
EOS PCT: 0 %
Eosinophils Absolute: 0 10*3/uL (ref 0–0.7)
HCT: 36.6 % — ABNORMAL LOW (ref 40.0–52.0)
Hemoglobin: 12.7 g/dL — ABNORMAL LOW (ref 13.0–18.0)
LYMPHS PCT: 10 %
Lymphs Abs: 0.2 10*3/uL — ABNORMAL LOW (ref 1.0–3.6)
MCH: 30.3 pg (ref 26.0–34.0)
MCHC: 34.8 g/dL (ref 32.0–36.0)
MCV: 87 fL (ref 80.0–100.0)
MONO ABS: 0.1 10*3/uL — AB (ref 0.2–1.0)
Monocytes Relative: 4 %
Neutro Abs: 1.6 10*3/uL (ref 1.4–6.5)
Neutrophils Relative %: 86 %
PLATELETS: 201 10*3/uL (ref 150–440)
RBC: 4.21 MIL/uL — ABNORMAL LOW (ref 4.40–5.90)
RDW: 15 % — AB (ref 11.5–14.5)
WBC: 1.9 10*3/uL — ABNORMAL LOW (ref 3.8–10.6)

## 2017-02-25 LAB — LACTIC ACID, PLASMA: Lactic Acid, Venous: 2 mmol/L (ref 0.5–1.9)

## 2017-02-25 IMAGING — CT CT ABD-PELV W/ CM
2 of 5 series · 16 of 46 positions shown, 18 images · IV contrast (APPLIED)
Comparison: PET CT [DATE]

CLINICAL DATA: Abdominal pain, fever.  Esophageal cancer

EXAM:
CT ABDOMEN AND PELVIS WITH CONTRAST
TECHNIQUE: Multidetector CT imaging of the abdomen and pelvis was performed
using the standard protocol following bolus administration of
intravenous contrast.
CONTRAST:  100mL [MU] IOPAMIDOL ([MU]) INJECTION 61%

[Series 2: routine abd/pel with · axial · 0.80mm/px · z∈[-775,-280]mm · 13 of 111 slices shown, 15 images]
[im 6/111  soft-tissue]
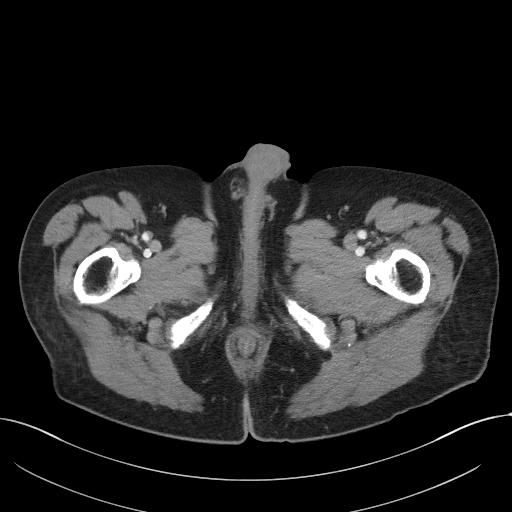
[im 6/111  bone]
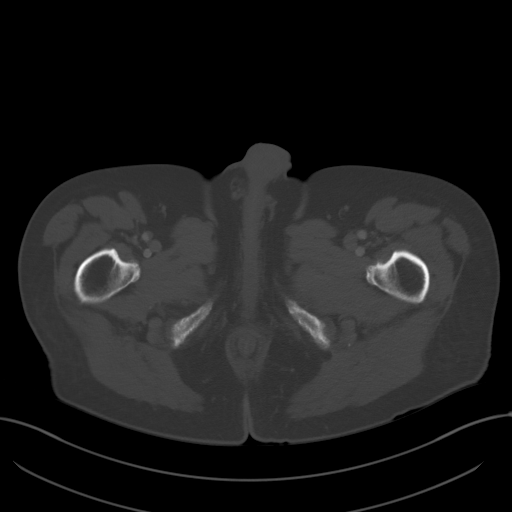
[im 18/111  soft-tissue]
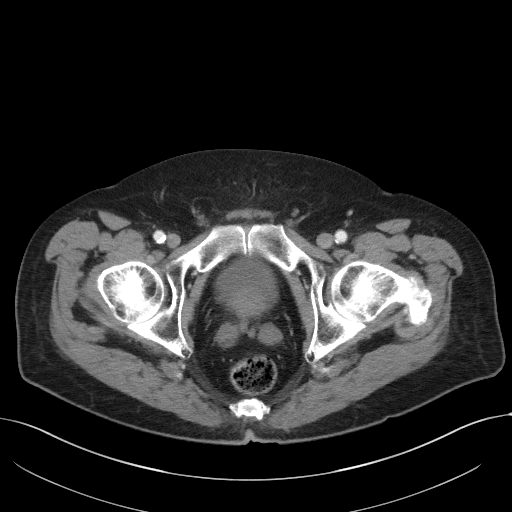
[im 24/111  soft-tissue]
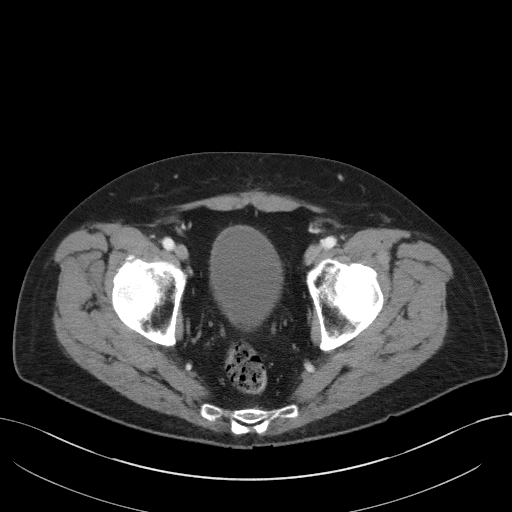
[im 29/111  soft-tissue]
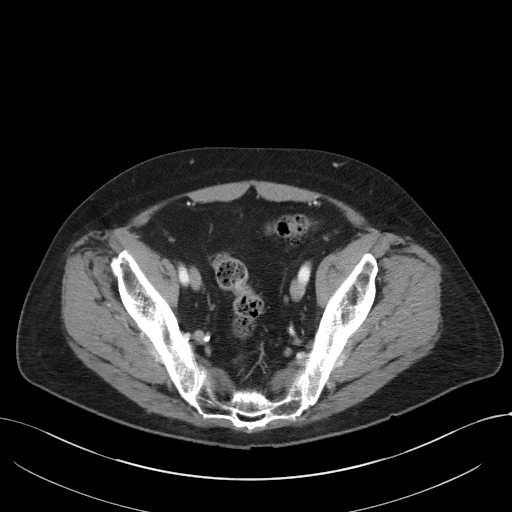
[im 41/111  soft-tissue]
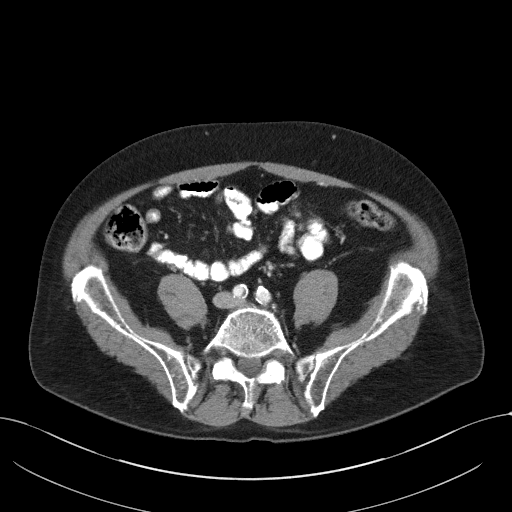
[im 47/111  soft-tissue]
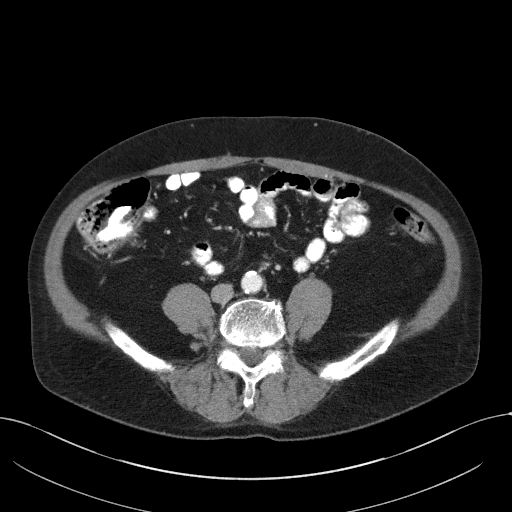
[im 58/111  soft-tissue]
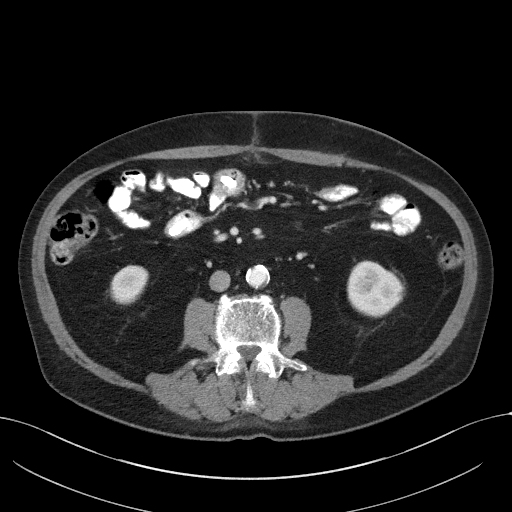
[im 64/111  soft-tissue]
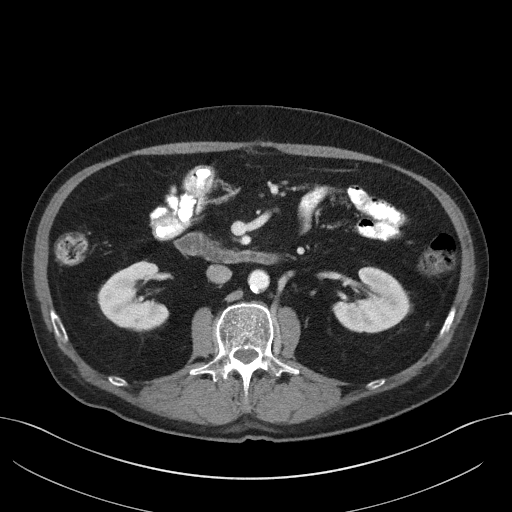
[im 70/111  soft-tissue]
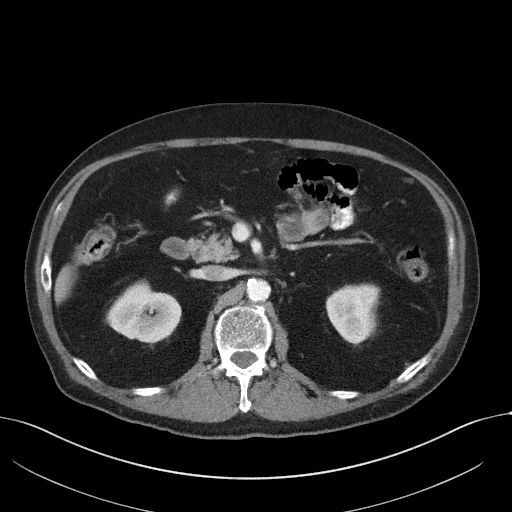
[im 70/111  bone]
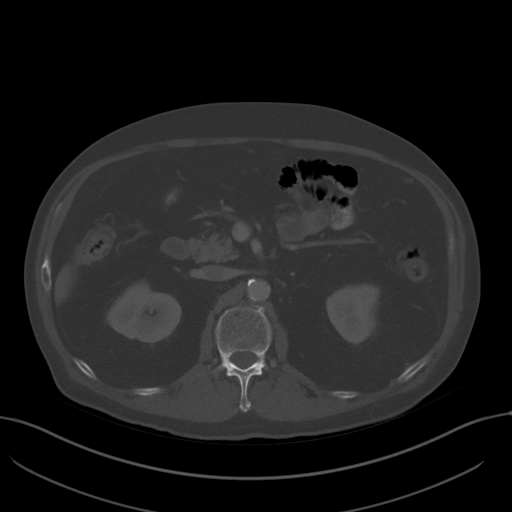
[im 82/111  soft-tissue]
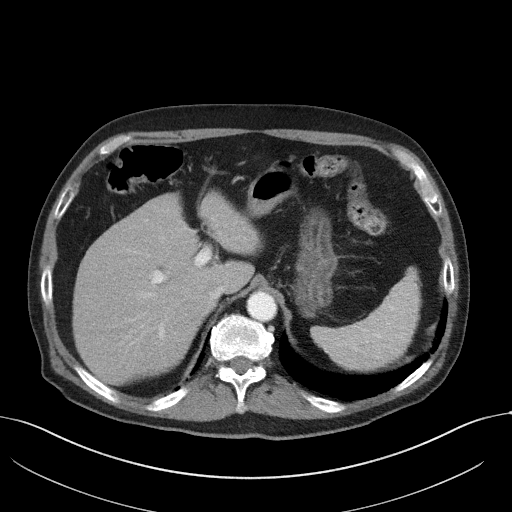
[im 87/111  soft-tissue]
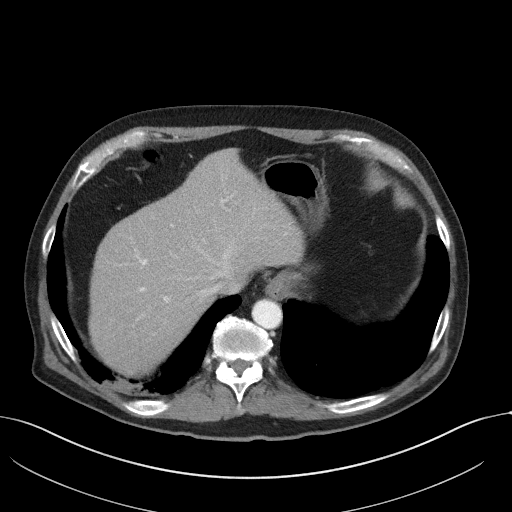
[im 93/111  soft-tissue]
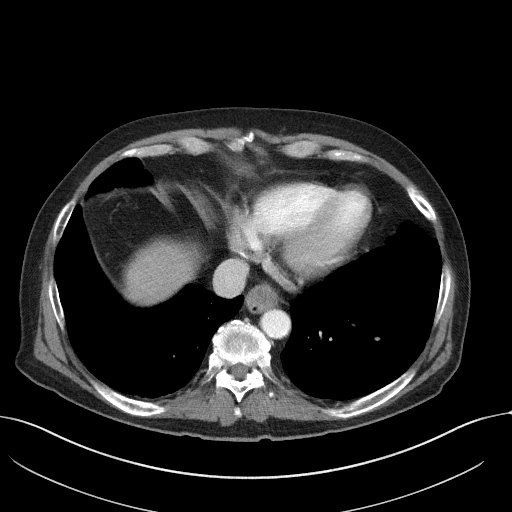
[im 105/111  soft-tissue]
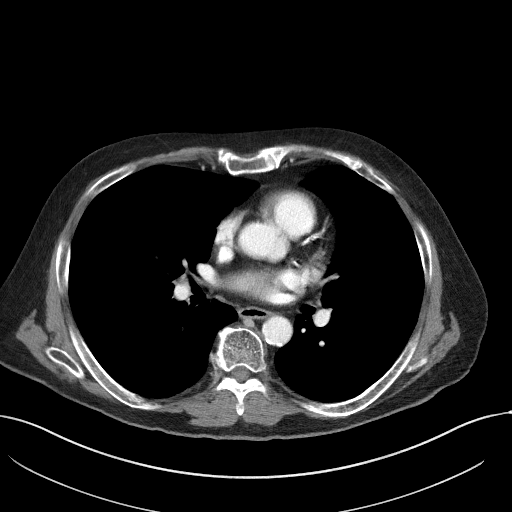

[Series 5: coronal st · coronal · 0.73mm/px · 3 of 85 slices shown]
[im 29/85  soft-tissue]
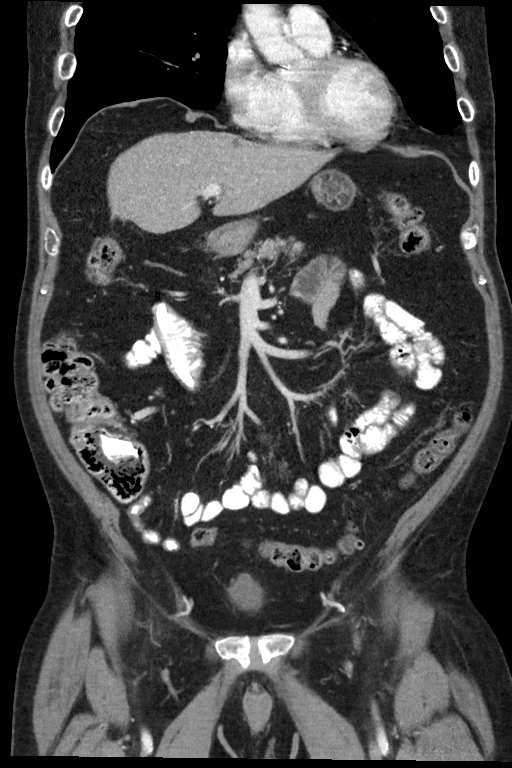
[im 38/85  soft-tissue]
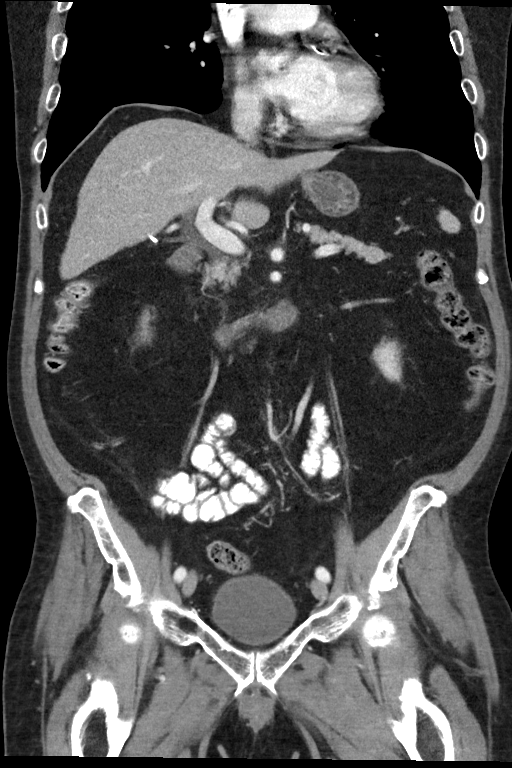
[im 47/85  soft-tissue]
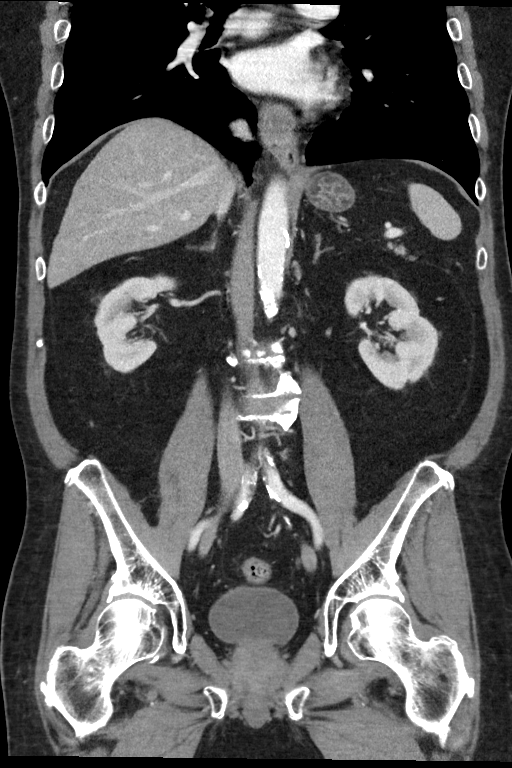

[16 of 46 positions shown; findings below may reference images not displayed]

FINDINGS: Lower chest: Distal esophageal mass is again noted as seen on prior
PET CT. Right base atelectasis. No effusions.

Hepatobiliary: Small hypodensity in the left hepatic dome measures
10 mm, most compatible with small cysts. No other focal hepatic
abnormality. Prior cholecystectomy.

Pancreas: No focal abnormality or ductal dilatation.

Spleen: Small calcifications throughout the spleen compatible with
old granulomatous disease.

Adrenals/Urinary Tract: No adrenal abnormality. No focal renal
abnormality. No stones or hydronephrosis. Urinary bladder is
unremarkable.

Stomach/Bowel: Jejunostomy tube is in place. Stomach, large and
small bowel grossly unremarkable.

Vascular/Lymphatic: Aortic and iliac calcifications. No aneurysm or
adenopathy.

Reproductive: Mild prostate prominence.

Other: No free fluid or free air.

Musculoskeletal: No acute bony abnormality.
IMPRESSION: Distal esophageal mass again noted, stable since prior PET CT.

Right base atelectasis.

No acute findings in the abdomen or pelvis.

## 2017-02-25 MED ORDER — VANCOMYCIN HCL IN DEXTROSE 1-5 GM/200ML-% IV SOLN
1000.0000 mg | Freq: Once | INTRAVENOUS | Status: AC
  Administered 2017-02-25: 1000 mg via INTRAVENOUS
  Filled 2017-02-25: qty 200

## 2017-02-25 MED ORDER — ENOXAPARIN SODIUM 40 MG/0.4ML ~~LOC~~ SOLN: 40.0000 mg | SUBCUTANEOUS | Status: DC

## 2017-02-25 MED ORDER — PIPERACILLIN-TAZOBACTAM 3.375 G IVPB 30 MIN
3.3750 g | Freq: Once | INTRAVENOUS | Status: AC
  Administered 2017-02-25: 3.375 g via INTRAVENOUS
  Filled 2017-02-25: qty 50

## 2017-02-25 MED ORDER — IOPAMIDOL (ISOVUE-300) INJECTION 61%: 15.0000 mL | INTRAVENOUS | Status: AC

## 2017-02-25 MED ORDER — VANCOMYCIN HCL 10 G IV SOLR
1250.0000 mg | Freq: Two times a day (BID) | INTRAVENOUS | Status: DC
  Administered 2017-02-26: 1250 mg via INTRAVENOUS
  Filled 2017-02-25 (×2): qty 1250

## 2017-02-25 MED ORDER — ONDANSETRON HCL 4 MG PO TABS: 4.0000 mg | ORAL_TABLET | Freq: Four times a day (QID) | ORAL | Status: DC | PRN

## 2017-02-25 MED ORDER — SODIUM CHLORIDE 0.9 % IV BOLUS (SEPSIS)
1000.0000 mL | Freq: Once | INTRAVENOUS | Status: AC
  Administered 2017-02-25: 1000 mL via INTRAVENOUS

## 2017-02-25 MED ORDER — IOPAMIDOL (ISOVUE-300) INJECTION 61%
100.0000 mL | Freq: Once | INTRAVENOUS | Status: AC | PRN
  Administered 2017-02-25: 100 mL via INTRAVENOUS

## 2017-02-25 MED ORDER — ACETAMINOPHEN 325 MG PO TABS: 650.0000 mg | ORAL_TABLET | Freq: Four times a day (QID) | ORAL | Status: DC | PRN

## 2017-02-25 MED ORDER — SODIUM CHLORIDE 0.9 % IV SOLN
INTRAVENOUS | Status: DC
  Administered 2017-02-25: via INTRAVENOUS

## 2017-02-25 MED ORDER — ONDANSETRON HCL 4 MG/2ML IJ SOLN: 4.0000 mg | Freq: Four times a day (QID) | INTRAMUSCULAR | Status: DC | PRN

## 2017-02-25 MED ORDER — PIPERACILLIN-TAZOBACTAM 3.375 G IVPB
3.3750 g | Freq: Three times a day (TID) | INTRAVENOUS | Status: DC
  Administered 2017-02-26: 03:00:00 3.375 g via INTRAVENOUS
  Filled 2017-02-25: qty 50

## 2017-02-25 MED ORDER — OXYCODONE HCL 5 MG PO TABS: 5.0000 mg | ORAL_TABLET | ORAL | Status: DC | PRN

## 2017-02-25 MED ORDER — ACETAMINOPHEN 650 MG RE SUPP: 650.0000 mg | Freq: Four times a day (QID) | RECTAL | Status: DC | PRN

## 2017-02-25 NOTE — ED Provider Notes (Signed)
[facility] Emergency Department Provider Note   ____________________________________________   First MD Initiated Contact with Patient 02/25/17 1555     (approximate)  I have reviewed the triage vital signs and the nursing notes.   HISTORY  Chief Complaint Post-op Problem    HPI Casey Acevedo is a 76 y.o. 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 He had a jejunostomy tube placed about a month ago. For the last 2-3 days she's had a lot of pain and redness and drainage which has a foul odor coming from around the tube site. He is changing his dressing every 2 hours and is still draining purulent dark colored material.   Past Medical History:  Diagnosis Date  . Cancer (HCC)    esophageal  . Dysphagia   . GERD (gastroesophageal reflux disease)     Patient Active Problem List   Diagnosis Date Noted  . Sepsis ([place]) 02/25/2017  . 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 Dehydration 02/20/2017  . Jejunostomy tube site pain ([place]) 01/29/2017  . 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 Goals of care, counseling/discussion 01/04/2017  . Protein-calorie malnutrition, severe 12/07/2016  . 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 Dysphagia 12/06/2016  . GERD (gastroesophageal reflux disease) 06/01/2016    Past Surgical History:  Procedure Laterality Date  . CHOLECYSTECTOMY    . ESOPHAGOGASTRODUODENOSCOPY (EGD) WITH PROPOFOL N/A 12/07/2016   Procedure: ESOPHAGOGASTRODUODENOSCOPY (EGD) WITH PROPOFOL;  Surgeon: Jonathon Bellows, MD;  Location: [facility] ENDOSCOPY;  Service: Gastroenterology;  Laterality: N/A;  . ESOPHAGOGASTRODUODENOSCOPY (EGD) WITH PROPOFOL N/A 12/26/2016   Procedure: ESOPHAGOGASTRODUODENOSCOPY (EGD) WITH PROPOFOL WITH DILATION;  Surgeon: Jonathon Bellows, MD;  Location: [facility] ENDOSCOPY;  Service: Gastroenterology;  Laterality: N/A;  . EYE SURGERY    .  GASTROJEJUNOSTOMY N/A 01/16/2017   Procedure: LAPROSCOPIC ASSIST FEEDING JEJUNOSTOMY TUBE;  Surgeon: Johnathan Hausen, MD;  Location: [facility] ORS;  Service: General;  Laterality: N/A;  . IR FLUORO GUIDE PORT INSERTION RIGHT  01/10/2017  . PICC LINE INSERTION Right     Prior to Admission medications   Medication Sig Start Date End Date Taking? Authorizing Provider  dexamethasone (DECADRON) 4 MG tablet Take 8 mg by mouth daily. Start the day after chemo for two days   Yes [provider]  diphenoxylate-atropine (LOMOTIL) 2.5-0.025 MG tablet Take 1 tablet 4 (four) times daily as needed by mouth for diarrhea or loose stools. 01/25/17  Yes Sindy Guadeloupe, MD  hydrocortisone cream-nystatin cream-zinc oxide Apply 1 application 4 (four) times daily topically. 01/29/17  Yes Verlon Au, NP  lidocaine-prilocaine (EMLA) cream APPLY TO AFFECTED AREA ONCE 01/05/17  Yes [provider]  loperamide (IMODIUM) 2 MG capsule Take 1 capsule (2 mg total) by mouth as needed for diarrhea or loose stools (take two tablests after the first loose stool, then 1 tablet after each loose stool). 02/20/17  Yes Sindy Guadeloupe, MD  Nutritional Supplements (FEEDING SUPPLEMENT, VITAL 1.5 CAL,) LIQD Give vital 1.5 via continuous pump at 71ml/hr for 22 hours.  Flush with 225ml of water before starting tube feeding and after stopping tube feeding.  Provide additional 235ml of water 2 other times during waking hours via J tube. 01/25/17  Yes Sindy Guadeloupe, MD  ondansetron (ZOFRAN) 8 MG tablet Take 1 tablet (8 mg total) by mouth 2 (two) times daily as needed for refractory nausea / vomiting. Start on day 3 after chemo. 01/05/17  Yes Sindy Guadeloupe, MD  ondansetron (ZOFRAN-ODT) 4 MG disintegrating tablet TAKE 1 TABLET BY MOUTH EVERY 8 HOURS AS NEEDED FOR NAUSEA AND VOMITING 01/04/17  Yes [provider]  prochlorperazine (COMPAZINE) 10 MG tablet TAKE 1 TABLET (10 MG TOTAL) BY MOUTH EVERY 6 (SIX) HOURS AS NEEDED  (NAUSEA OR VOMITING). 01/11/17  Yes [provider]  dexamethasone 0.5 MG/5ML elixir 80 mLs (8 mg total) by Per J Tube route daily. Start the day after chemotherapy and take for 2 days (after every chemotherapy treatment) Patient not taking: Reported on 02/25/2017 02/20/17 03/27/17  Sindy Guadeloupe, MD  OLANZapine (ZYPREXA) 10 MG tablet Take 1 tablet (10 mg total) by mouth at bedtime. Patient not taking: Reported on 02/25/2017 01/30/17 04/30/17  Sindy Guadeloupe, MD    Allergies Patient has no known allergies.  Family History  Problem Relation Age of Onset  . Heart failure Father   . Prostate cancer Neg Hx   . Bladder Cancer Neg Hx   . Kidney cancer Neg Hx     Social History Social History   Tobacco Use  . Smoking status: Never Smoker  . Smokeless tobacco: Never Used  Substance Use Topics  . Alcohol use: No  . Drug use: No    Review of Systems  Constitutional: No fever/chills Eyes: No visual changes. ENT: No sore throat. Cardiovascular: Denies chest pain. Respiratory: Denies shortness of breath. Gastrointestinal:see history of present illness Genitourinary: Negative for dysuria. Musculoskeletal: Negative for back pain. Skin: Negative for rash. Neurological: Negative for headaches, focal weakness or   ____________________________________________   PHYSICAL EXAM:  VITAL SIGNS: ED Triage Vitals  Enc Vitals Group     BP 02/25/17 1535 121/80     Pulse Rate 02/25/17 1535 (!) 128     Resp 02/25/17 1535 18     Temp 02/25/17 1535 97.6 F (36.4 C)     Temp Source 02/25/17 1535 Oral     SpO2 02/25/17 1535 96 %     Weight 02/25/17 1535 180 lb (81.6 kg)     Height 02/25/17 1535 5\' 9"  (1.753 m)     Head Circumference --      Peak Flow --      Pain Score 02/25/17 1542 5     Pain Loc --      Pain Edu? --      Excl. in [place]? --     Constitutional: Alert and oriented. Well appearing and in no acute distress. Eyes: Conjunctivae are normal. PER. EOMI. Head:  Atraumatic. Nose: No congestion/rhinnorhea. Mouth/Throat: Mucous membranes are moist.  Oropharynx non-erythematous. Neck: No stridor. Cardiovascular: Normal rate, regular rhythm. Grossly normal heart sounds.  Good peripheral circulation. Respiratory: Normal respiratory effort.  No retractions. Lungs CTAB. Gastrointestinal: Soft and nontenderexcept for immediately around the  tube sitethere is some redness immediately around the area as well.. No distention. No abdominal bruits. No CVA tenderness. Musculoskeletal: No lower extremity tenderness nor edema.  No joint effusions. Neurologic:  Normal speech and language. No gross focal neurologic deficits are appreciated. No gait instability. Skin:  Skin is warm, dry and intact. No rash notedexcept C gastrointestinal above. Psychiatric: Mood and affect are normal. Speech and behavior are normal.  ____________________________________________   LABS (all labs ordered are listed, but only abnormal results are displayed)  Labs Reviewed  LACTIC ACID, PLASMA - Abnormal; Notable for the following components:      Result Value   Lactic Acid, Venous 2.0 (*)    All other  components within normal limits  COMPREHENSIVE METABOLIC PANEL - Abnormal; Notable for the following components:   Glucose, Bld 146 (*)    BUN 29 (*)    Calcium 8.5 (*)    Total Protein 5.9 (*)    Albumin 2.9 (*)    Total Bilirubin 1.4 (*)    All other components within normal limits  CBC WITH DIFFERENTIAL/PLATELET - Abnormal; Notable for the following components:   WBC 1.9 (*)    RBC 4.21 (*)    Hemoglobin 12.7 (*)    HCT 36.6 (*)    RDW 15.0 (*)    Lymphs Abs 0.2 (*)    Monocytes Absolute 0.1 (*)    All other components within normal limits  CULTURE, BLOOD (ROUTINE X 2)  CULTURE, BLOOD (ROUTINE X 2)  AEROBIC/ANAEROBIC CULTURE (SURGICAL/DEEP WOUND)  LACTIC ACID, PLASMA  URINALYSIS, ROUTINE W REFLEX MICROSCOPIC  CBC  COMPREHENSIVE METABOLIC PANEL    ____________________________________________  EKG   ____________________________________________  RADIOLOGY  CT read by radiology as no acute pathology ____________________________________________   PROCEDURES  Procedure(s) performed:   Procedures  Critical Care performed:   ____________________________________________   INITIAL IMPRESSION / ASSESSMENT AND PLAN / ED COURSE  discussed patient with hospitalist who feels as I do that since the patient is neutropenic and was tachycardic initially this is probably sepsis number should put him in the hospital. I have discussed the patient with the surgeon who performed the J-tube placement and has cross coverage and they were suggesting by mouth antibiotics as an outpatient but we will be safe and put him in the hospital since he again is neutropenic     ____________________________________________   FINAL CLINICAL IMPRESSION(S) / ED DIAGNOSES  Final diagnoses:  Cellulitis of abdominal wall     ED Discharge Orders    None       Note:  This document was prepared using Dragon voice recognition software and may include unintentional dictation errors.    Nena Polio, MD 02/25/17 [phone]

## 2017-02-25 NOTE — ED Notes (Signed)
Md notified that CT report is back on patient.

## 2017-02-25 NOTE — ED Notes (Signed)
ED Provider at bedside. 

## 2017-02-25 NOTE — ED Notes (Signed)
Odor and purulent drainage coming from around PEG site.  Patient states he is changing his dressings Q2H.

## 2017-02-25 NOTE — ED Triage Notes (Signed)
Pt present to ED c/o redness and purulent drainage around PEG tube that started Friday night. Pt states he is having to change the dressign Q2H to keep up with drainage. Pt currently receiving chemotherapy for esophageal cancer, last chemo last thursday and last radiation last Friday.

## 2017-02-25 NOTE — Progress Notes (Signed)
CODE SEPSIS - PHARMACY COMMUNICATION  **Broad Spectrum Antibiotics should be administered within 1 hour of Sepsis diagnosis**  Time Code Sepsis Called/Page Received: 12/16 @ 2150  Antibiotics Ordered: Zosyn 3.375g                                      Vancomycin 1g  Time of 1st antibiotic administration: 12/16 @ 2150  Additional action taken by pharmacy: N/A  If necessary, Name of Provider/Nurse Contacted: N/A   Pernell Dupre ,PharmD, [place] Pharmacist  02/25/2017  9:53 PM

## 2017-02-25 NOTE — ED Notes (Signed)
First bottle of contrast put through Patient's feeding tube.  Patient tolerated this well at this time.  Patient educated that we will wait one hour and then complete the second bottle.

## 2017-02-25 NOTE — H&P (Signed)
[place] [facility] [place] NAME: Casey Acevedo    MR#:  [id number]  DATE OF BIRTH:  1941/01/28  DATE OF ADMISSION:  02/25/2017  PRIMARY CARE PHYSICIAN: Idelle Crouch, MD   REQUESTING/REFERRING PHYSICIAN: Cinda Quest, MD  CHIEF COMPLAINT:   Chief Complaint  Patient presents with  . Post-op Problem    HISTORY OF PRESENT ILLNESS:  Casey Acevedo  is a 76 y.o. male who presents with erythema and drainage around the jejunostomy site.  Patient states that he has had some drainage from this site for the past couple of weeks.  He was seen by his primary surgeon and was given antibiotic ointment.  However, he states that over the last couple of days the erythema and drainage is gotten significantly worse.  He is also currently on chemotherapy and getting radiation therapy as well.  Tonight in the ED he was found to have low white blood cell count and presented initially tachycardic, raising concern for sepsis.  Jejunostomy site is certainly very erythematous and tender with purulent drainage.  Hospitalist were called for admission  PAST MEDICAL HISTORY:   Past Medical History:  Diagnosis Date  . Cancer (HCC)    esophageal  . Dysphagia   . GERD (gastroesophageal reflux disease)     PAST SURGICAL HISTORY:   Past Surgical History:  Procedure Laterality Date  . CHOLECYSTECTOMY    . ESOPHAGOGASTRODUODENOSCOPY (EGD) WITH PROPOFOL N/A 12/07/2016   Procedure: ESOPHAGOGASTRODUODENOSCOPY (EGD) WITH PROPOFOL;  Surgeon: Jonathon Bellows, MD;  Location: [facility] ENDOSCOPY;  Service: Gastroenterology;  Laterality: N/A;  . ESOPHAGOGASTRODUODENOSCOPY (EGD) WITH PROPOFOL N/A 12/26/2016   Procedure: ESOPHAGOGASTRODUODENOSCOPY (EGD) WITH PROPOFOL WITH DILATION;  Surgeon: Jonathon Bellows, MD;  Location: [facility] ENDOSCOPY;  Service: Gastroenterology;  Laterality: N/A;  . EYE SURGERY    . GASTROJEJUNOSTOMY N/A 01/16/2017   Procedure: LAPROSCOPIC ASSIST FEEDING JEJUNOSTOMY TUBE;   Surgeon: Johnathan Hausen, MD;  Location: [facility] ORS;  Service: General;  Laterality: N/A;  . IR FLUORO GUIDE PORT INSERTION RIGHT  01/10/2017  . PICC LINE INSERTION Right     SOCIAL HISTORY:   Social History   Tobacco Use  . Smoking status: Never Smoker  . Smokeless tobacco: Never Used  Substance Use Topics  . Alcohol use: No    FAMILY HISTORY:   Family History  Problem Relation Age of Onset  . Heart failure Father   . Prostate cancer Neg Hx   . Bladder Cancer Neg Hx   . Kidney cancer Neg Hx     DRUG ALLERGIES:  No Known Allergies  MEDICATIONS AT HOME:   Prior to Admission medications   Medication Sig Start Date End Date Taking? Authorizing Provider  dexamethasone (DECADRON) 4 MG tablet Take 8 mg by mouth daily. Start the day after chemo for two days   Yes [provider]  diphenoxylate-atropine (LOMOTIL) 2.5-0.025 MG tablet Take 1 tablet 4 (four) times daily as needed by mouth for diarrhea or loose stools. 01/25/17  Yes Sindy Guadeloupe, MD  hydrocortisone cream-nystatin cream-zinc oxide Apply 1 application 4 (four) times daily topically. 01/29/17  Yes Verlon Au, NP  lidocaine-prilocaine (EMLA) cream APPLY TO AFFECTED AREA ONCE 01/05/17  Yes [provider]  loperamide (IMODIUM) 2 MG capsule Take 1 capsule (2 mg total) by mouth as needed for diarrhea or loose stools (take two tablests after the first loose stool, then 1 tablet after each loose stool). 02/20/17  Yes Sindy Guadeloupe, MD  Nutritional Supplements (FEEDING  SUPPLEMENT, VITAL 1.5 CAL,) LIQD Give vital 1.5 via continuous pump at 42ml/hr for 22 hours.  Flush with 262ml of water before starting tube feeding and after stopping tube feeding.  Provide additional 267ml of water 2 other times during waking hours via J tube. 01/25/17  Yes Sindy Guadeloupe, MD  ondansetron (ZOFRAN) 8 MG tablet Take 1 tablet (8 mg total) by mouth 2 (two) times daily as needed for refractory nausea / vomiting. Start on day 3 after  chemo. 01/05/17  Yes Sindy Guadeloupe, MD  ondansetron (ZOFRAN-ODT) 4 MG disintegrating tablet TAKE 1 TABLET BY MOUTH EVERY 8 HOURS AS NEEDED FOR NAUSEA AND VOMITING 01/04/17  Yes [provider]  prochlorperazine (COMPAZINE) 10 MG tablet TAKE 1 TABLET (10 MG TOTAL) BY MOUTH EVERY 6 (SIX) HOURS AS NEEDED (NAUSEA OR VOMITING). 01/11/17  Yes [provider]  dexamethasone 0.5 MG/5ML elixir 80 mLs (8 mg total) by Per J Tube route daily. Start the day after chemotherapy and take for 2 days (after every chemotherapy treatment) Patient not taking: Reported on 02/25/2017 02/20/17 03/27/17  Sindy Guadeloupe, MD  OLANZapine (ZYPREXA) 10 MG tablet Take 1 tablet (10 mg total) by mouth at bedtime. Patient not taking: Reported on 02/25/2017 01/30/17 04/30/17  Sindy Guadeloupe, MD    REVIEW OF SYSTEMS:  Review of Systems  Constitutional: Negative for chills, fever, malaise/fatigue and weight loss.  HENT: Negative for ear pain, hearing loss and tinnitus.   Eyes: Negative for blurred vision, double vision, pain and redness.  Respiratory: Negative for cough, hemoptysis and shortness of breath.   Cardiovascular: Negative for chest pain, palpitations, orthopnea and leg swelling.  Gastrointestinal: Negative for abdominal pain, constipation, diarrhea, nausea and vomiting.  Genitourinary: Negative for dysuria, frequency and hematuria.  Musculoskeletal: Negative for back pain, joint pain and neck pain.  Skin:       Erythema and drainage around jejunostomy site  Neurological: Negative for dizziness, tremors, focal weakness and weakness.  Endo/Heme/Allergies: Negative for polydipsia. Does not bruise/bleed easily.  Psychiatric/Behavioral: Negative for depression. The patient is not nervous/anxious and does not have insomnia.      VITAL SIGNS:   Vitals:   02/25/17 2000 02/25/17 2010 02/25/17 2030 02/25/17 2100  BP: 127/80 137/81 133/76 134/80  Pulse:  96 99 94  Resp:  17  18  Temp:      TempSrc:       SpO2:  97% 93% 93%  Weight:      Height:       Wt Readings from Last 3 Encounters:  02/25/17 81.6 kg (180 lb)  02/21/17 81.6 kg (180 lb)  02/20/17 81.6 kg (180 lb)    PHYSICAL EXAMINATION:  Physical Exam  Vitals reviewed. Constitutional: He is oriented to person, place, and time. He appears well-developed and well-nourished. No distress.  HENT:  Head: Normocephalic and atraumatic.  Mouth/Throat: Oropharynx is clear and moist.  Eyes: Conjunctivae and EOM are normal. Pupils are equal, round, and reactive to light. No scleral icterus.  Neck: Normal range of motion. Neck supple. No JVD present. No thyromegaly present.  Cardiovascular: Normal rate, regular rhythm and intact distal pulses. Exam reveals no gallop and no friction rub.  No murmur heard. Respiratory: Effort normal and breath sounds normal. No respiratory distress. He has no wheezes. He has no rales.  GI: Soft. Bowel sounds are normal. He exhibits no distension. There is tenderness (Around jejunostomy site).  Musculoskeletal: Normal range of motion. He exhibits no edema.  No arthritis,  no gout  Lymphadenopathy:    He has no cervical adenopathy.  Neurological: He is alert and oriented to person, place, and time. No cranial nerve deficit.  No dysarthria, no aphasia  Skin: Skin is warm and dry. No rash noted. There is erythema (With purulent drainage around jejunostomy site).  Psychiatric: He has a normal mood and affect. His behavior is normal. Judgment and thought content normal.    LABORATORY PANEL:   CBC Recent Labs  Lab 02/25/17 1630  WBC 1.9*  HGB 12.7*  HCT 36.6*  PLT 201   ------------------------------------------------------------------------------------------------------------------  Chemistries  Recent Labs  Lab 02/25/17 1630  NA 136  K 4.4  CL 103  CO2 24  GLUCOSE 146*  BUN 29*  CREATININE 0.66  CALCIUM 8.5*  AST 24  ALT 42  ALKPHOS 98  BILITOT 1.4*    ------------------------------------------------------------------------------------------------------------------  Cardiac Enzymes No results for input(s): TROPONINI in the last 168 hours. ------------------------------------------------------------------------------------------------------------------  RADIOLOGY:  Ct Abdomen Pelvis W Contrast  Result Date: 02/25/2017 CLINICAL DATA:  Abdominal pain, fever.  Esophageal cancer EXAM: CT ABDOMEN AND PELVIS WITH CONTRAST TECHNIQUE: Multidetector CT imaging of the abdomen and pelvis was performed using the standard protocol following bolus administration of intravenous contrast. CONTRAST:  153mL ISOVUE-300 IOPAMIDOL (ISOVUE-300) INJECTION 61% COMPARISON:  PET CT 01/03/2017 FINDINGS: Lower chest: Distal esophageal mass is again noted as seen on prior PET CT. Right base atelectasis. No effusions. Hepatobiliary: Small hypodensity in the left hepatic dome measures 10 mm, most compatible with small cysts. No other focal hepatic abnormality. Prior cholecystectomy. Pancreas: No focal abnormality or ductal dilatation. Spleen: Small calcifications throughout the spleen compatible with old granulomatous disease. Adrenals/Urinary Tract: No adrenal abnormality. No focal renal abnormality. No stones or hydronephrosis. Urinary bladder is unremarkable. Stomach/Bowel: Jejunostomy tube is in place. Stomach, large and small bowel grossly unremarkable. Vascular/Lymphatic: Aortic and iliac calcifications. No aneurysm or adenopathy. Reproductive: Mild prostate prominence. Other: No free fluid or free air. Musculoskeletal: No acute bony abnormality. IMPRESSION: Distal esophageal mass again noted, stable since prior PET CT. Right base atelectasis. No acute findings in the abdomen or pelvis. Electronically Signed   By: Rolm Baptise M.D.   On: 02/25/2017 19:34    EKG:   Orders placed or performed during the hospital encounter of 02/25/17  . ED EKG 12-Lead  . ED EKG 12-Lead     IMPRESSION AND PLAN:  Principal Problem:   Sepsis ([place]) -broad-spectrum IV antibiotics initiated.  Likely related to his age and ostomy site infection.  Cultures sent.  Lactate was borderline elevated at 2.0, IV fluids in place. 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 Management plans discussed with the patient and/or family.  DVT PROPHYLAXIS: SubQ lovenox  GI PROPHYLAXIS: None  ADMISSION STATUS: Inpatient  CODE STATUS: Full Code Status History    Date Active Date Inactive Code Status Order ID Comments User Context   12/06/2016 19:23 12/12/2016 14:43 Full Code [id number]  Gladstone Lighter, MD Inpatient      TOTAL TIME TAKING CARE OF THIS PATIENT: 45 minutes.   Palmira Stickle FIELDING 02/25/2017,  9:38 PM  [facility]  Office  [phone]  CC: Primary care physician; Idelle Crouch, MD  Note:  This document was prepared using Dragon voice recognition software and may include unintentional dictation errors.

## 2017-02-25 NOTE — ED Notes (Signed)
Patient transported to CT 

## 2017-02-25 NOTE — ED Notes (Signed)
Admission MD at bedside.  

## 2017-02-25 NOTE — Progress Notes (Signed)
Pharmacy Antibiotic Note  REI MEDLEN is a 76 y.o. male admitted on 02/25/2017 with sepsis.  Pharmacy has been consulted for vancomycin and Zosyn dosing.  Plan: DW 82kg  Vd 57L kei 0.07 hr-1  T1/2 10 hours Vancomycin 1250 mg q 12 hours ordered with stacked dosing. Level before 5th dose. Goal trough 15-20.  Zosyn 3,375 grams q 8 hours EI ordered.  Height: 5\' 9"  (175.3 cm) Weight: 180 lb (81.6 kg) IBW/kg (Calculated) : 70.7  Temp (24hrs), Avg:97.6 F (36.4 C), Min:97.6 F (36.4 C), Max:97.6 F (36.4 C)  Recent Labs  Lab 02/20/17 0943 02/25/17 1630  WBC 3.6* 1.9*  CREATININE 0.77 0.66  LATICACIDVEN  --  2.0*    Estimated Creatinine Clearance: 78.6 mL/min (by C-G formula based on SCr of 0.66 mg/dL).    No Known Allergies  Antimicrobials this admission: Vancomycin, Zosyn 12/16  >>    >>   Dose adjustments this admission:   Microbiology results: 12/16 BCx: pending 12/16 WoundCx: pending       12/16 UA: penidng   Thank you for allowing pharmacy to be a part of this patient's care.  Addiel Mccardle S 02/25/2017 11:30 PM

## 2017-02-26 ENCOUNTER — Ambulatory Visit
Admission: RE | Admit: 2017-02-26 | Discharge: 2017-02-26 | Disposition: A | Discharge status: Home / Self Care | Payer: Medicare Other | Source: Ambulatory Visit | Attending: Radiation Oncology | Admitting: Radiation Oncology

## 2017-02-26 ENCOUNTER — Other Ambulatory Visit: Payer: Self-pay | Admitting: *Deleted

## 2017-02-26 ENCOUNTER — Telehealth: Payer: Self-pay | Admitting: *Deleted

## 2017-02-26 ENCOUNTER — Ambulatory Visit: Payer: Medicare Other

## 2017-02-26 DIAGNOSIS — K9412 Enterostomy infection: Secondary | ICD-10-CM | POA: Diagnosis not present

## 2017-02-26 DIAGNOSIS — L03311 Cellulitis of abdominal wall: Secondary | ICD-10-CM | POA: Diagnosis not present

## 2017-02-26 DIAGNOSIS — C155 Malignant neoplasm of lower third of esophagus: Secondary | ICD-10-CM | POA: Diagnosis present

## 2017-02-26 DIAGNOSIS — K219 Gastro-esophageal reflux disease without esophagitis: Secondary | ICD-10-CM | POA: Diagnosis not present

## 2017-02-26 DIAGNOSIS — A419 Sepsis, unspecified organism: Secondary | ICD-10-CM | POA: Diagnosis not present

## 2017-02-26 DIAGNOSIS — Z9049 Acquired absence of other specified parts of digestive tract: Secondary | ICD-10-CM | POA: Diagnosis not present

## 2017-02-26 DIAGNOSIS — Z510 Encounter for antineoplastic radiation therapy: Secondary | ICD-10-CM | POA: Diagnosis not present

## 2017-02-26 LAB — URINALYSIS, ROUTINE W REFLEX MICROSCOPIC
Bilirubin Urine: NEGATIVE
Glucose, UA: NEGATIVE mg/dL
Hgb urine dipstick: NEGATIVE
KETONES UR: NEGATIVE mg/dL
LEUKOCYTES UA: NEGATIVE
NITRITE: NEGATIVE
PROTEIN: NEGATIVE mg/dL
Specific Gravity, Urine: 1.025 (ref 1.005–1.030)
pH: 6 (ref 5.0–8.0)

## 2017-02-26 LAB — COMPREHENSIVE METABOLIC PANEL
ALBUMIN: 2.5 g/dL — AB (ref 3.5–5.0)
ALT: 35 U/L (ref 17–63)
ANION GAP: 5 (ref 5–15)
AST: 17 U/L (ref 15–41)
Alkaline Phosphatase: 80 U/L (ref 38–126)
BUN: 24 mg/dL — ABNORMAL HIGH (ref 6–20)
CHLORIDE: 106 mmol/L (ref 101–111)
CO2: 24 mmol/L (ref 22–32)
Calcium: 7.8 mg/dL — ABNORMAL LOW (ref 8.9–10.3)
Creatinine, Ser: 0.68 mg/dL (ref 0.61–1.24)
GFR calc Af Amer: >60 mL/min (ref >60)
GFR calc non Af Amer: >60 mL/min (ref >60)
GLUCOSE: 111 mg/dL — AB (ref 65–99)
POTASSIUM: 4.2 mmol/L (ref 3.5–5.1)
SODIUM: 135 mmol/L (ref 135–145)
Total Bilirubin: 1.3 mg/dL — ABNORMAL HIGH (ref 0.3–1.2)
Total Protein: 4.7 g/dL — ABNORMAL LOW (ref 6.5–8.1)

## 2017-02-26 LAB — CBC
HCT: 32.5 % — ABNORMAL LOW (ref 40.0–52.0)
Hemoglobin: 11.1 g/dL — ABNORMAL LOW (ref 13.0–18.0)
MCH: 29.9 pg (ref 26.0–34.0)
MCHC: 34.1 g/dL (ref 32.0–36.0)
MCV: 87.6 fL (ref 80.0–100.0)
Platelets: 191 10*3/uL (ref 150–440)
RBC: 3.7 MIL/uL — ABNORMAL LOW (ref 4.40–5.90)
RDW: 15 % — AB (ref 11.5–14.5)
WBC: 1.8 10*3/uL — ABNORMAL LOW (ref 3.8–10.6)

## 2017-02-26 MED ORDER — LIDOCAINE-EPINEPHRINE (PF) 1 %-1:200000 IJ SOLN
10.0000 mL | Freq: Once | INTRAMUSCULAR | Status: AC
  Administered 2017-02-26: 10 mL via INTRADERMAL
  Filled 2017-02-26: qty 10

## 2017-02-26 MED ORDER — NYSTATIN 100000 UNIT/GM EX POWD: Freq: Two times a day (BID) | CUTANEOUS | 0 refills | Status: DC

## 2017-02-26 MED ORDER — TBO-FILGRASTIM 480 MCG/0.8ML ~~LOC~~ SOSY
480.0000 ug | PREFILLED_SYRINGE | Freq: Every day | SUBCUTANEOUS | Status: DC
  Administered 2017-02-26: 11:00:00 480 ug via SUBCUTANEOUS
  Filled 2017-02-26: qty 0.8

## 2017-02-26 MED ORDER — NYSTATIN 100000 UNIT/GM EX POWD
Freq: Two times a day (BID) | CUTANEOUS | Status: DC
  Administered 2017-02-26: 14:00:00 via TOPICAL
  Filled 2017-02-26: qty 15

## 2017-02-26 MED ORDER — CEPHALEXIN 250 MG PO CAPS: 250.0000 mg | ORAL_CAPSULE | Freq: Three times a day (TID) | ORAL | 0 refills | Status: DC

## 2017-02-26 MED ORDER — CEPHALEXIN 250 MG/5ML PO SUSR: 500.0000 mg | Freq: Three times a day (TID) | ORAL | 0 refills | Status: DC

## 2017-02-26 NOTE — Progress Notes (Signed)
Discussed discharge instructions and medications with patient. IV removed. All questions addressed. Patient transported home via car by his wife.  Clarise Cruz, RN

## 2017-02-26 NOTE — Telephone Encounter (Signed)
Called pt and spoke to wife and the pt did not get rest in hospital and he is in bed and sleeping.  I told her that after radiation tom. He needs to get neupogen shot.  I made appt 11:15 and he will need to check in to get it. She is agreeable.  We have changed his chemo appt to 12/20 8:30 for labs and then see md and treatment depending on how the pt is doing. She will have him here.

## 2017-02-26 NOTE — Consult Note (Signed)
Date of Consultation:  02/26/2017  Requesting Physician:  Lance Coon, MD  Reason for Consultation:  Infected jejunostomy tube site  History of Present Illness: TRISTIN VANDEUSEN is a 76 y.o. male with a history of esophageal cancer, currently undergoing chemotherapy and radiation therapy.  He had a Jejunostomy tube placed laparoscopically at [facility] on 11/6 by Dr. Hassell Done.  He presented to the ED yesterday because he's been having significant drainage around the tube with worsening erythema around the tube as well.  He had been tachycardic in the ED and was admitted with concern for possible sepsis and started on broad spectrum IV antibiotics.  CT scan did not reveal an intra-abdominal abscess or abdominal wall abscess around the J-tube site.  This morning, patient reports he feels better and things the area of erythema is smaller.  He is still having drainage.    Past Medical History: Past Medical History:  Diagnosis Date  . Cancer (HCC)    esophageal  . Dysphagia   . GERD (gastroesophageal reflux disease)      Past Surgical History: Past Surgical History:  Procedure Laterality Date  . CHOLECYSTECTOMY    . ESOPHAGOGASTRODUODENOSCOPY (EGD) WITH PROPOFOL N/A 12/07/2016   Procedure: ESOPHAGOGASTRODUODENOSCOPY (EGD) WITH PROPOFOL;  Surgeon: Jonathon Bellows, MD;  Location: [facility] ENDOSCOPY;  Service: Gastroenterology;  Laterality: N/A;  . ESOPHAGOGASTRODUODENOSCOPY (EGD) WITH PROPOFOL N/A 12/26/2016   Procedure: ESOPHAGOGASTRODUODENOSCOPY (EGD) WITH PROPOFOL WITH DILATION;  Surgeon: Jonathon Bellows, MD;  Location: [facility] ENDOSCOPY;  Service: Gastroenterology;  Laterality: N/A;  . EYE SURGERY    . GASTROJEJUNOSTOMY N/A 01/16/2017   Procedure: LAPROSCOPIC ASSIST FEEDING JEJUNOSTOMY TUBE;  Surgeon: Johnathan Hausen, MD;  Location: [facility] ORS;  Service: General;  Laterality: N/A;  . IR FLUORO GUIDE PORT INSERTION RIGHT  01/10/2017  . PICC LINE INSERTION Right     Home Medications: Prior to Admission  medications   Medication Sig Start Date End Date Taking? Authorizing Provider  dexamethasone (DECADRON) 4 MG tablet Take 8 mg by mouth daily. Start the day after chemo for two days   Yes [provider]  diphenoxylate-atropine (LOMOTIL) 2.5-0.025 MG tablet Take 1 tablet 4 (four) times daily as needed by mouth for diarrhea or loose stools. 01/25/17  Yes Sindy Guadeloupe, MD  hydrocortisone cream-nystatin cream-zinc oxide Apply 1 application 4 (four) times daily topically. 01/29/17  Yes Verlon Au, NP  lidocaine-prilocaine (EMLA) cream APPLY TO AFFECTED AREA ONCE 01/05/17  Yes [provider]  loperamide (IMODIUM) 2 MG capsule Take 1 capsule (2 mg total) by mouth as needed for diarrhea or loose stools (take two tablests after the first loose stool, then 1 tablet after each loose stool). 02/20/17  Yes Sindy Guadeloupe, MD  Nutritional Supplements (FEEDING SUPPLEMENT, VITAL 1.5 CAL,) LIQD Give vital 1.5 via continuous pump at 59ml/hr for 22 hours.  Flush with 248ml of water before starting tube feeding and after stopping tube feeding.  Provide additional 234ml of water 2 other times during waking hours via J tube. 01/25/17  Yes Sindy Guadeloupe, MD  ondansetron (ZOFRAN) 8 MG tablet Take 1 tablet (8 mg total) by mouth 2 (two) times daily as needed for refractory nausea / vomiting. Start on day 3 after chemo. 01/05/17  Yes Sindy Guadeloupe, MD  ondansetron (ZOFRAN-ODT) 4 MG disintegrating tablet TAKE 1 TABLET BY MOUTH EVERY 8 HOURS AS NEEDED FOR NAUSEA AND VOMITING 01/04/17  Yes [provider]  prochlorperazine (COMPAZINE) 10 MG tablet TAKE 1 TABLET (10 MG TOTAL) BY  MOUTH EVERY 6 (SIX) HOURS AS NEEDED (NAUSEA OR VOMITING). 01/11/17  Yes [provider]  dexamethasone 0.5 MG/5ML elixir 80 mLs (8 mg total) by Per J Tube route daily. Start the day after chemotherapy and take for 2 days (after every chemotherapy treatment) Patient not taking: Reported on 02/25/2017 02/20/17 03/27/17   Sindy Guadeloupe, MD  OLANZapine (ZYPREXA) 10 MG tablet Take 1 tablet (10 mg total) by mouth at bedtime. Patient not taking: Reported on 02/25/2017 01/30/17 04/30/17  Sindy Guadeloupe, MD    Allergies: No Known Allergies   Review of Systems: Review of Systems  Constitutional: Negative for chills and fever.  Respiratory: Negative for shortness of breath.   Cardiovascular: Negative for chest pain.  Gastrointestinal: Negative for abdominal pain, nausea and vomiting.  Skin: Positive for rash (erythema and drainage around J-tube site).    Physical Exam BP (!) 102/56 (BP Location: Left Arm)   Pulse 84   Temp 98 F (36.7 C) (Oral)   Resp 18   Ht 5\' 9"  (1.753 m)   Wt 81.6 kg (180 lb)   SpO2 95%   BMI 26.58 kg/m  CONSTITUTIONAL: No acute distress HEENT:  Normocephalic, atraumatic, extraocular motion intact. RESPIRATORY:  Lungs are clear, and breath sounds are equal bilaterally. Normal respiratory effort without pathologic use of accessory muscles. CARDIOVASCULAR: Heart is regular without murmurs, gallops, or rubs. GI: The abdomen is soft, nondistended.  There is only some mild tenderness to palpation in the immediate vicinity of the J-tube in the left side.  There is drainage of succus around the J-tube, but no evidence of abscess or purulent drainage. The skin incision where tube is coming out from has about a 1.5 cm gap where fluid can drain out.  There were no palpable masses. The tube does have some leeway in moving in and out of the skin and appears to have been re-secured in place post-op. MUSCULOSKELETAL:  Normal muscle strength and tone in all four extremities.  No peripheral edema or cyanosis. SKIN: Erythema around J-tube site, approximately 4 cm over the lateral and inferior portions of the tube and 1 cm medially and superiorly. This is consistent with excoriation from the fluid rather than abscess.   Laboratory Analysis: Results for orders placed or performed during the hospital  encounter of 02/25/17 (from the past 24 hour(s))  Lactic acid, plasma     Status: Abnormal   Collection Time: 02/25/17  4:30 PM  Result Value Ref Range   Lactic Acid, Venous 2.0 (HH) 0.5 - 1.9 mmol/L  Comprehensive metabolic panel     Status: Abnormal   Collection Time: 02/25/17  4:30 PM  Result Value Ref Range   Sodium 136 135 - 145 mmol/L   Potassium 4.4 3.5 - 5.1 mmol/L   Chloride 103 101 - 111 mmol/L   CO2 24 22 - 32 mmol/L   Glucose, Bld 146 (H) 65 - 99 mg/dL   BUN 29 (H) 6 - 20 mg/dL   Creatinine, Ser 0.66 0.61 - 1.24 mg/dL   Calcium 8.5 (L) 8.9 - 10.3 mg/dL   Total Protein 5.9 (L) 6.5 - 8.1 g/dL   Albumin 2.9 (L) 3.5 - 5.0 g/dL   AST 24 15 - 41 U/L   ALT 42 17 - 63 U/L   Alkaline Phosphatase 98 38 - 126 U/L   Total Bilirubin 1.4 (H) 0.3 - 1.2 mg/dL   GFR calc non Af Amer >60 >60 mL/min   GFR calc Af Amer >60 >60  mL/min   Anion gap 9 5 - 15  CBC with Differential     Status: Abnormal   Collection Time: 02/25/17  4:30 PM  Result Value Ref Range   WBC 1.9 (L) 3.8 - 10.6 K/uL   RBC 4.21 (L) 4.40 - 5.90 MIL/uL   Hemoglobin 12.7 (L) 13.0 - 18.0 g/dL   HCT 36.6 (L) 40.0 - 52.0 %   MCV 87.0 80.0 - 100.0 fL   MCH 30.3 26.0 - 34.0 pg   MCHC 34.8 32.0 - 36.0 g/dL   RDW 15.0 (H) 11.5 - 14.5 %   Platelets 201 150 - 440 K/uL   Neutrophils Relative % 86 %   Neutro Abs 1.6 1.4 - 6.5 K/uL   Lymphocytes Relative 10 %   Lymphs Abs 0.2 (L) 1.0 - 3.6 K/uL   Monocytes Relative 4 %   Monocytes Absolute 0.1 (L) 0.2 - 1.0 K/uL   Eosinophils Relative 0 %   Eosinophils Absolute 0.0 0 - 0.7 K/uL   Basophils Relative 0 %   Basophils Absolute 0.0 0 - 0.1 K/uL  Blood Culture (routine x 2)     Status: None (Preliminary result)   Collection Time: 02/25/17  9:40 PM  Result Value Ref Range   Specimen Description BLOOD LEFT HAND    Special Requests      BOTTLES DRAWN AEROBIC AND ANAEROBIC Blood Culture adequate volume   Culture NO GROWTH < 12 HOURS    Report Status PENDING   Blood Culture  (routine x 2)     Status: None (Preliminary result)   Collection Time: 02/25/17  9:40 PM  Result Value Ref Range   Specimen Description BLOOD LEFT WRIST    Special Requests      BOTTLES DRAWN AEROBIC AND ANAEROBIC Blood Culture results may not be optimal due to an excessive volume of blood received in culture bottles   Culture NO GROWTH < 12 HOURS    Report Status PENDING   Urinalysis, Routine w reflex microscopic     Status: Abnormal   Collection Time: 02/25/17  9:40 PM  Result Value Ref Range   Color, Urine YELLOW (A) YELLOW   APPearance CLEAR (A) CLEAR   Specific Gravity, Urine 1.025 1.005 - 1.030   pH 6.0 5.0 - 8.0   Glucose, UA NEGATIVE NEGATIVE mg/dL   Hgb urine dipstick NEGATIVE NEGATIVE   Bilirubin Urine NEGATIVE NEGATIVE   Ketones, ur NEGATIVE NEGATIVE mg/dL   Protein, ur NEGATIVE NEGATIVE mg/dL   Nitrite NEGATIVE NEGATIVE   Leukocytes, UA NEGATIVE NEGATIVE  CBC     Status: Abnormal   Collection Time: 02/26/17  5:01 AM  Result Value Ref Range   WBC 1.8 (L) 3.8 - 10.6 K/uL   RBC 3.70 (L) 4.40 - 5.90 MIL/uL   Hemoglobin 11.1 (L) 13.0 - 18.0 g/dL   HCT 32.5 (L) 40.0 - 52.0 %   MCV 87.6 80.0 - 100.0 fL   MCH 29.9 26.0 - 34.0 pg   MCHC 34.1 32.0 - 36.0 g/dL   RDW 15.0 (H) 11.5 - 14.5 %   Platelets 191 150 - 440 K/uL  Comprehensive metabolic panel     Status: Abnormal   Collection Time: 02/26/17  5:01 AM  Result Value Ref Range   Sodium 135 135 - 145 mmol/L   Potassium 4.2 3.5 - 5.1 mmol/L   Chloride 106 101 - 111 mmol/L   CO2 24 22 - 32 mmol/L   Glucose, Bld 111 (H) 65 - 99 mg/dL  BUN 24 (H) 6 - 20 mg/dL   Creatinine, Ser 0.68 0.61 - 1.24 mg/dL   Calcium 7.8 (L) 8.9 - 10.3 mg/dL   Total Protein 4.7 (L) 6.5 - 8.1 g/dL   Albumin 2.5 (L) 3.5 - 5.0 g/dL   AST 17 15 - 41 U/L   ALT 35 17 - 63 U/L   Alkaline Phosphatase 80 38 - 126 U/L   Total Bilirubin 1.3 (H) 0.3 - 1.2 mg/dL   GFR calc non Af Amer >60 >60 mL/min   GFR calc Af Amer >60 >60 mL/min   Anion gap 5 5 -  15    Imaging: Ct Abdomen Pelvis W Contrast  Result Date: 02/25/2017 CLINICAL DATA:  Abdominal pain, fever.  Esophageal cancer EXAM: CT ABDOMEN AND PELVIS WITH CONTRAST TECHNIQUE: Multidetector CT imaging of the abdomen and pelvis was performed using the standard protocol following bolus administration of intravenous contrast. CONTRAST:  129mL ISOVUE-300 IOPAMIDOL (ISOVUE-300) INJECTION 61% COMPARISON:  PET CT 01/03/2017 FINDINGS: Lower chest: Distal esophageal mass is again noted as seen on prior PET CT. Right base atelectasis. No effusions. Hepatobiliary: Small hypodensity in the left hepatic dome measures 10 mm, most compatible with small cysts. No other focal hepatic abnormality. Prior cholecystectomy. Pancreas: No focal abnormality or ductal dilatation. Spleen: Small calcifications throughout the spleen compatible with old granulomatous disease. Adrenals/Urinary Tract: No adrenal abnormality. No focal renal abnormality. No stones or hydronephrosis. Urinary bladder is unremarkable. Stomach/Bowel: Jejunostomy tube is in place. Stomach, large and small bowel grossly unremarkable. Vascular/Lymphatic: Aortic and iliac calcifications. No aneurysm or adenopathy. Reproductive: Mild prostate prominence. Other: No free fluid or free air. Musculoskeletal: No acute bony abnormality. IMPRESSION: Distal esophageal mass again noted, stable since prior PET CT. Right base atelectasis. No acute findings in the abdomen or pelvis. Electronically Signed   By: Rolm Baptise M.D.   On: 02/25/2017 19:34    Assessment and Plan: This is a 76 y.o. male who presents with drainage around his J tube.  I have independently viewed his imaging study and reviewed his laboratory studies.  There is no intra-abdominal infection and no abdominal wall infection.  The J-tube is in place inside small bowel and bowel is adherent to abdominal wall as it should be.  There is no true infection of the J tube site, and this is likely related to  the drainage around the tube.  The skin gap is large and allowing contents to seep through.  At bedside, cleaned the skin around J tube with iodine pad.  Infiltrated 3 ml of 1% lidocaine with epi around J tube and closed the skin gap with two sutures of 2-0 Prolene.  One of those sutures was also used to secure the J-tube to the skin at its original position from surgery.  Covered with dry gauze and tape.  No further drainage noted.  Patient tolerated the procedure well and all sharps were appropriately disposed of.  J tube can be used.  Discussed with patient that as a precaution it is reasonable to keep him on antibiotics for a 7 day course since he is on chemotherapy and radiation.  Will also order nystatin powder for the skin excoriation.  He can follow up with his surgeon in [place] as outpatient.    Melvyn Neth, [place]

## 2017-02-27 ENCOUNTER — Inpatient Hospital Stay: Payer: Medicare Other

## 2017-02-27 ENCOUNTER — Inpatient Hospital Stay: Payer: Medicare Other | Admitting: Oncology

## 2017-02-27 ENCOUNTER — Ambulatory Visit: Payer: Medicare Other

## 2017-02-27 ENCOUNTER — Ambulatory Visit
Admission: RE | Admit: 2017-02-27 | Discharge: 2017-02-27 | Disposition: A | Discharge status: Home / Self Care | Payer: Medicare Other | Source: Ambulatory Visit | Attending: Radiation Oncology | Admitting: Radiation Oncology

## 2017-02-27 DIAGNOSIS — Z87891 Personal history of nicotine dependence: Secondary | ICD-10-CM | POA: Diagnosis not present

## 2017-02-27 DIAGNOSIS — Z95828 Presence of other vascular implants and grafts: Secondary | ICD-10-CM

## 2017-02-27 DIAGNOSIS — Z934 Other artificial openings of gastrointestinal tract status: Secondary | ICD-10-CM | POA: Diagnosis not present

## 2017-02-27 DIAGNOSIS — E860 Dehydration: Secondary | ICD-10-CM | POA: Diagnosis not present

## 2017-02-27 DIAGNOSIS — K219 Gastro-esophageal reflux disease without esophagitis: Secondary | ICD-10-CM | POA: Diagnosis not present

## 2017-02-27 DIAGNOSIS — Z79899 Other long term (current) drug therapy: Secondary | ICD-10-CM | POA: Diagnosis not present

## 2017-02-27 DIAGNOSIS — Z9049 Acquired absence of other specified parts of digestive tract: Secondary | ICD-10-CM | POA: Diagnosis not present

## 2017-02-27 DIAGNOSIS — R197 Diarrhea, unspecified: Secondary | ICD-10-CM | POA: Diagnosis not present

## 2017-02-27 DIAGNOSIS — K9423 Gastrostomy malfunction: Secondary | ICD-10-CM | POA: Diagnosis not present

## 2017-02-27 DIAGNOSIS — E43 Unspecified severe protein-calorie malnutrition: Secondary | ICD-10-CM

## 2017-02-27 DIAGNOSIS — R1310 Dysphagia, unspecified: Secondary | ICD-10-CM

## 2017-02-27 DIAGNOSIS — K909 Intestinal malabsorption, unspecified: Secondary | ICD-10-CM | POA: Diagnosis not present

## 2017-02-27 DIAGNOSIS — C155 Malignant neoplasm of lower third of esophagus: Secondary | ICD-10-CM | POA: Diagnosis not present

## 2017-02-27 DIAGNOSIS — Z5111 Encounter for antineoplastic chemotherapy: Secondary | ICD-10-CM | POA: Diagnosis present

## 2017-02-27 DIAGNOSIS — Z510 Encounter for antineoplastic radiation therapy: Secondary | ICD-10-CM | POA: Diagnosis not present

## 2017-02-27 MED ORDER — DEXAMETHASONE SODIUM PHOSPHATE 100 MG/10ML IJ SOLN: 10.0000 mg | Freq: Once | INTRAMUSCULAR | Status: DC

## 2017-02-27 MED ORDER — DEXAMETHASONE SODIUM PHOSPHATE 10 MG/ML IJ SOLN
10.0000 mg | Freq: Once | INTRAMUSCULAR | Status: AC
  Administered 2017-02-27: 10 mg via INTRAVENOUS
  Filled 2017-02-27: qty 1

## 2017-02-27 MED ORDER — SODIUM CHLORIDE 0.9 % IV SOLN
Freq: Once | INTRAVENOUS | Status: AC
  Administered 2017-02-27: 11:00:00 via INTRAVENOUS
  Filled 2017-02-27: qty 1000

## 2017-02-27 MED ORDER — TBO-FILGRASTIM 480 MCG/0.8ML ~~LOC~~ SOSY
480.0000 ug | PREFILLED_SYRINGE | Freq: Once | SUBCUTANEOUS | Status: AC
  Administered 2017-02-27: 480 ug via SUBCUTANEOUS

## 2017-02-27 NOTE — Progress Notes (Signed)
[place] =1.6 on 12/16.  MD wants to give Granix on 12/17.

## 2017-02-28 ENCOUNTER — Other Ambulatory Visit: Payer: Self-pay | Admitting: *Deleted

## 2017-02-28 ENCOUNTER — Ambulatory Visit: Payer: Medicare Other

## 2017-02-28 ENCOUNTER — Inpatient Hospital Stay: Payer: Medicare Other

## 2017-02-28 ENCOUNTER — Ambulatory Visit
Admission: RE | Admit: 2017-02-28 | Discharge: 2017-02-28 | Disposition: A | Discharge status: Home / Self Care | Payer: Medicare Other | Source: Ambulatory Visit | Attending: Radiation Oncology | Admitting: Radiation Oncology

## 2017-02-28 ENCOUNTER — Other Ambulatory Visit (INDEPENDENT_AMBULATORY_CARE_PROVIDER_SITE_OTHER): Payer: Self-pay | Admitting: Surgery

## 2017-02-28 VITALS — BP 117/73 | HR 94 | Resp 20

## 2017-02-28 DIAGNOSIS — E860 Dehydration: Secondary | ICD-10-CM

## 2017-02-28 DIAGNOSIS — C155 Malignant neoplasm of lower third of esophagus: Secondary | ICD-10-CM | POA: Diagnosis not present

## 2017-02-28 DIAGNOSIS — R1310 Dysphagia, unspecified: Secondary | ICD-10-CM

## 2017-02-28 DIAGNOSIS — Z5111 Encounter for antineoplastic chemotherapy: Secondary | ICD-10-CM | POA: Diagnosis not present

## 2017-02-28 DIAGNOSIS — K9413 Enterostomy malfunction: Secondary | ICD-10-CM

## 2017-02-28 DIAGNOSIS — E43 Unspecified severe protein-calorie malnutrition: Secondary | ICD-10-CM

## 2017-02-28 MED ORDER — SODIUM CHLORIDE 0.9 % IV SOLN
Freq: Once | INTRAVENOUS | Status: AC
  Administered 2017-02-28: 11:00:00 via INTRAVENOUS
  Filled 2017-02-28: qty 1000

## 2017-02-28 MED ORDER — HEPARIN SOD (PORK) LOCK FLUSH 100 UNIT/ML IV SOLN
500.0000 [IU] | Freq: Once | INTRAVENOUS | Status: AC
  Administered 2017-02-27: 500 [IU] via INTRAVENOUS

## 2017-02-28 MED ORDER — DEXAMETHASONE SODIUM PHOSPHATE 10 MG/ML IJ SOLN
10.0000 mg | Freq: Once | INTRAMUSCULAR | Status: AC
  Administered 2017-02-28: 10 mg via INTRAVENOUS
  Filled 2017-02-28: qty 1

## 2017-02-28 MED ORDER — SODIUM CHLORIDE 0.9 % IV SOLN: 10.0000 mg | Freq: Once | INTRAVENOUS | Status: DC

## 2017-02-28 MED ORDER — SODIUM CHLORIDE 0.9% FLUSH
10.0000 mL | INTRAVENOUS | Status: DC | PRN
  Administered 2017-02-27: 10 mL via INTRAVENOUS
  Filled 2017-02-28: qty 10

## 2017-02-28 MED ORDER — SODIUM CHLORIDE 0.9 % IV SOLN
Freq: Once | INTRAVENOUS | Status: AC
  Filled 2017-02-28: qty 1000

## 2017-02-28 MED ORDER — HYDROMORPHONE HCL 1 MG/ML IJ SOLN
0.5000 mg | Freq: Once | INTRAMUSCULAR | Status: AC
  Administered 2017-02-28: 0.5 mg via INTRAVENOUS
  Filled 2017-02-28: qty 0.5

## 2017-02-28 MED ORDER — SODIUM CHLORIDE 0.9% FLUSH
10.0000 mL | Freq: Once | INTRAVENOUS | Status: AC
  Administered 2017-02-28: 10 mL via INTRAVENOUS
  Filled 2017-02-28: qty 10

## 2017-02-28 MED ORDER — HEPARIN SOD (PORK) LOCK FLUSH 100 UNIT/ML IV SOLN
500.0000 [IU] | Freq: Once | INTRAVENOUS | Status: AC
  Administered 2017-02-28: 500 [IU] via INTRAVENOUS
  Filled 2017-02-28: qty 5

## 2017-02-28 NOTE — Discharge Summary (Signed)
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 Cancer (HCC)    esophageal  . Dysphagia   . GERD (gastroesophageal reflux disease)      ADMITTING HISTORY  HISTORY OF PRESENT ILLNESS:  Casey Acevedo  is a 76 y.o. male who presents with erythema and drainage around the jejunostomy site.  Patient states that he has had some drainage from this site for the past couple of weeks.  He was seen by his primary surgeon and was given antibiotic ointment.  However, he states that over the last couple of days the erythema and drainage is gotten significantly worse.  He is also currently on chemotherapy and getting radiation therapy as well.  Tonight in the ED he was found to have low white blood cell count and presented initially tachycardic, raising concern for sepsis.  Jejunostomy site is certainly very erythematous and tender with purulent drainage.  Hospitalist were called for admission     HOSPITAL COURSE:   *Skin excoriation around PEG tube with mild cellulitis.  This was due to leakage around the site.  Surgery was consulted.  Sutures placed.  Patient was given prescription for Keflex and nystatin powder.  Sepsis present on admission has resolved.  Esophageal cancer.  Follows up at the cancer center.  Stable for discharge home.  CONSULTS OBTAINED:  Treatment Team:  Sindy Guadeloupe,  MD Olean Ree, MD  DRUG ALLERGIES:  No Known Allergies  DISCHARGE MEDICATIONS:   Allergies as of 02/26/2017   No Known Allergies     Medication List    TAKE these medications   cephALEXin 250 MG/5ML suspension Commonly known as:  KEFLEX Take 10 mLs (500 mg total) by mouth 3 (three) times daily.   dexamethasone 4 MG tablet Commonly known as:  DECADRON Take 8 mg by mouth daily. Start the day after chemo for two days   dexamethasone 0.5 MG/5ML elixir 80 mLs (8 mg total) by Per J Tube route daily. Start the day after chemotherapy and take for 2 days (after every chemotherapy treatment)   diphenoxylate-atropine 2.5-0.025 MG tablet Commonly known as:  LOMOTIL Take 1 tablet 4 (four) times daily as needed by mouth for diarrhea or loose stools.   feeding supplement (VITAL 1.5 CAL) Liqd Give vital 1.5 via continuous pump at 24ml/hr for 22 hours.  Flush with 229ml of water before starting tube feeding and after stopping tube feeding.  Provide additional 261ml of water 2 other times during waking hours via J tube.   hydrocortisone cream-nystatin cream-zinc oxide Apply 1 application 4 (four) times daily topically.   lidocaine-prilocaine cream Commonly known as:  EMLA APPLY TO AFFECTED AREA ONCE   loperamide 2 MG capsule Commonly known as:  IMODIUM Take 1 capsule (2 mg total) by mouth as needed for diarrhea or loose stools (take two tablests after the first loose stool, then 1 tablet after  each loose stool).   nystatin powder Commonly known as:  MYCOSTATIN/NYSTOP Apply topically 2 (two) times daily. Apply to excoriated skin around J Tube. Cover with gauze   OLANZapine 10 MG tablet Commonly known as:  ZYPREXA Take 1 tablet (10 mg total) by mouth at bedtime.   ondansetron 4 MG disintegrating tablet Commonly known as:  ZOFRAN-ODT TAKE 1 TABLET BY MOUTH EVERY 8 HOURS AS NEEDED FOR NAUSEA AND VOMITING   ondansetron 8 MG tablet Commonly known as:  ZOFRAN Take 1 tablet (8 mg  total) by mouth 2 (two) times daily as needed for refractory nausea / vomiting. Start on day 3 after chemo.   prochlorperazine 10 MG tablet Commonly known as:  COMPAZINE TAKE 1 TABLET (10 MG TOTAL) BY MOUTH EVERY 6 (SIX) HOURS AS NEEDED (NAUSEA OR VOMITING).       Today   VITAL SIGNS:  Blood pressure (!) 102/56, pulse 84, temperature 98 F (36.7 C), temperature source Oral, resp. rate 18, height 5\' 9"  (1.753 m), weight 81.6 kg (180 lb), SpO2 95 %.  I/O:  No intake or output data in the 24 hours ending 02/28/17 1517  PHYSICAL EXAMINATION:  Physical Exam  GENERAL:  76 y.o.-year-old patient lying in the bed with no acute distress.  LUNGS: Normal breath sounds bilaterally, no wheezing, rales,rhonchi or crepitation. No use of accessory muscles of respiration.  CARDIOVASCULAR: S1, S2 normal. No murmurs, rubs, or gallops.  ABDOMEN: Soft, non-tender, non-distended. Bowel sounds present. No organomegaly or mass.  NEUROLOGIC: Moves all 4 extremities. PSYCHIATRIC: The patient is alert and oriented x 3.  SKIN: No obvious rash, lesion, or ulcer.   DATA REVIEW:   CBC Recent Labs  Lab 02/26/17 0501  WBC 1.8*  HGB 11.1*  HCT 32.5*  PLT 191    Chemistries  Recent Labs  Lab 02/26/17 0501  NA 135  K 4.2  CL 106  CO2 24  GLUCOSE 111*  BUN 24*  CREATININE 0.68  CALCIUM 7.8*  AST 17  ALT 35  ALKPHOS 80  BILITOT 1.3*    Cardiac Enzymes No results for input(s): TROPONINI in the last 168 hours.  Microbiology Results  Results for orders placed or performed during the hospital encounter of 02/25/17  Blood Culture (routine x 2)     Status: None (Preliminary result)   Collection Time: 02/25/17  9:40 PM  Result Value Ref Range Status   Specimen Description BLOOD LEFT HAND  Final   Special Requests   Final    BOTTLES DRAWN AEROBIC AND ANAEROBIC Blood Culture adequate volume   Culture NO GROWTH 3 DAYS  Final   Report Status PENDING  Incomplete  Blood Culture (routine x 2)      Status: None (Preliminary result)   Collection Time: 02/25/17  9:40 PM  Result Value Ref Range Status   Specimen Description BLOOD LEFT WRIST  Final   Special Requests   Final    BOTTLES DRAWN AEROBIC AND ANAEROBIC Blood Culture results may not be optimal due to an excessive volume of blood received in culture bottles   Culture NO GROWTH 3 DAYS  Final   Report Status PENDING  Incomplete  Aerobic/Anaerobic Culture (surgical/deep wound)     Status: Abnormal (Preliminary result)   Collection Time: 02/25/17 10:20 PM  Result Value Ref Range Status   Specimen Description PEG SITE  Final   Special Requests Immunocompromised  Final   Gram Stain   Final    NO WBC SEEN MODERATE GRAM NEGATIVE RODS  FEW GRAM POSITIVE RODS FEW GRAM POSITIVE COCCI IN PAIRS Performed at [facility], [place] [address] [place], [place] [address]    Culture (A)  Final    MULTIPLE ORGANISMS PRESENT, NONE PREDOMINANT NO ANAEROBES ISOLATED; CULTURE IN PROGRESS FOR 5 DAYS    Report Status PENDING  Incomplete    RADIOLOGY:  No results found.  Follow up with PCP in 1 week.  Management plans discussed with the patient, family and they are in agreement.  CODE STATUS:  Code Status History    Date Active Date Inactive Code Status Order ID Comments User Context   02/25/2017 23:13 02/26/2017 18:53 Full Code [id number]  Lance Coon, MD Inpatient   12/06/2016 19:23 12/12/2016 14:43 Full Code [id number]  Gladstone Lighter, MD Inpatient      TOTAL TIME TAKING CARE OF THIS PATIENT ON DAY OF DISCHARGE: more than 30 minutes.   Leia Alf Elissia Spiewak M.D on 02/28/2017 at 3:17 PM  Between 7am to 6pm - Pager - [phone]  After 6pm go to [email or web address] - password [email or web address] [facility]  Office  [phone]  CC: Primary care physician; Idelle Crouch, MD  Note: This dictation was prepared with Dragon dictation along with smaller phrase technology. Any transcriptional errors that result from this  process are unintentional.

## 2017-03-01 ENCOUNTER — Inpatient Hospital Stay (HOSPITAL_BASED_OUTPATIENT_CLINIC_OR_DEPARTMENT_OTHER): Payer: Medicare Other | Admitting: Oncology

## 2017-03-01 ENCOUNTER — Ambulatory Visit
Admission: RE | Admit: 2017-03-01 | Discharge: 2017-03-01 | Disposition: A | Discharge status: Home / Self Care | Payer: Medicare Other | Source: Ambulatory Visit | Attending: Radiation Oncology | Admitting: Radiation Oncology

## 2017-03-01 ENCOUNTER — Inpatient Hospital Stay: Payer: Medicare Other

## 2017-03-01 ENCOUNTER — Other Ambulatory Visit: Payer: Self-pay | Admitting: *Deleted

## 2017-03-01 ENCOUNTER — Encounter: Payer: Self-pay | Admitting: Oncology

## 2017-03-01 VITALS — BP 125/77 | HR 112 | Temp 97.4°F | Resp 18 | Wt 182.0 lb

## 2017-03-01 DIAGNOSIS — K9413 Enterostomy malfunction: Secondary | ICD-10-CM

## 2017-03-01 DIAGNOSIS — Z5111 Encounter for antineoplastic chemotherapy: Secondary | ICD-10-CM | POA: Diagnosis not present

## 2017-03-01 DIAGNOSIS — Z79899 Other long term (current) drug therapy: Secondary | ICD-10-CM | POA: Diagnosis not present

## 2017-03-01 DIAGNOSIS — C155 Malignant neoplasm of lower third of esophagus: Secondary | ICD-10-CM

## 2017-03-01 DIAGNOSIS — K9423 Gastrostomy malfunction: Secondary | ICD-10-CM

## 2017-03-01 LAB — COMPREHENSIVE METABOLIC PANEL
ALK PHOS: 110 U/L (ref 38–126)
ALT: 46 U/L (ref 17–63)
AST: 27 U/L (ref 15–41)
Albumin: 2.8 g/dL — ABNORMAL LOW (ref 3.5–5.0)
Anion gap: 7 (ref 5–15)
BILIRUBIN TOTAL: 0.9 mg/dL (ref 0.3–1.2)
BUN: 27 mg/dL — AB (ref 6–20)
CALCIUM: 7.8 mg/dL — AB (ref 8.9–10.3)
CHLORIDE: 105 mmol/L (ref 101–111)
CO2: 23 mmol/L (ref 22–32)
CREATININE: 0.74 mg/dL (ref 0.61–1.24)
Glucose, Bld: 112 mg/dL — ABNORMAL HIGH (ref 65–99)
Potassium: 3.8 mmol/L (ref 3.5–5.1)
Sodium: 135 mmol/L (ref 135–145)
TOTAL PROTEIN: 5.4 g/dL — AB (ref 6.5–8.1)

## 2017-03-01 LAB — CBC WITH DIFFERENTIAL/PLATELET
BASOS ABS: 0.1 10*3/uL (ref 0–0.1)
Basophils Relative: 1 %
EOS ABS: 0 10*3/uL (ref 0–0.7)
Eosinophils Relative: 0 %
HCT: 34.8 % — ABNORMAL LOW (ref 40.0–52.0)
Hemoglobin: 11.7 g/dL — ABNORMAL LOW (ref 13.0–18.0)
LYMPHS ABS: 0.6 10*3/uL — AB (ref 1.0–3.6)
Lymphocytes Relative: 6 %
MCH: 29.9 pg (ref 26.0–34.0)
MCHC: 33.7 g/dL (ref 32.0–36.0)
MCV: 88.8 fL (ref 80.0–100.0)
MONO ABS: 0.3 10*3/uL (ref 0.2–1.0)
Monocytes Relative: 3 %
NEUTROS PCT: 90 %
Neutro Abs: 9.4 10*3/uL — ABNORMAL HIGH (ref 1.4–6.5)
PLATELETS: 170 10*3/uL (ref 150–440)
RBC: 3.92 MIL/uL — AB (ref 4.40–5.90)
RDW: 15.1 % — AB (ref 11.5–14.5)
WBC: 10.4 10*3/uL (ref 3.8–10.6)

## 2017-03-01 MED ORDER — PALONOSETRON HCL INJECTION 0.25 MG/5ML
0.2500 mg | Freq: Once | INTRAVENOUS | Status: AC
  Administered 2017-03-01: 0.25 mg via INTRAVENOUS
  Filled 2017-03-01: qty 5

## 2017-03-01 MED ORDER — SODIUM CHLORIDE 0.9% FLUSH
10.0000 mL | INTRAVENOUS | Status: DC | PRN
  Administered 2017-03-01: 10 mL via INTRAVENOUS
  Filled 2017-03-01: qty 10

## 2017-03-01 MED ORDER — SODIUM CHLORIDE 0.9 % IV SOLN
Freq: Once | INTRAVENOUS | Status: AC
  Administered 2017-03-01: 10:00:00 via INTRAVENOUS
  Filled 2017-03-01: qty 1000

## 2017-03-01 MED ORDER — DEXAMETHASONE SODIUM PHOSPHATE 100 MG/10ML IJ SOLN
20.0000 mg | Freq: Once | INTRAMUSCULAR | Status: AC
  Administered 2017-03-01: 20 mg via INTRAVENOUS
  Filled 2017-03-01: qty 2

## 2017-03-01 MED ORDER — FAMOTIDINE IN NACL 20-0.9 MG/50ML-% IV SOLN
20.0000 mg | Freq: Once | INTRAVENOUS | Status: AC
  Administered 2017-03-01: 20 mg via INTRAVENOUS

## 2017-03-01 MED ORDER — SODIUM CHLORIDE 0.9 % IV SOLN
195.0000 mg | Freq: Once | INTRAVENOUS | Status: AC
  Administered 2017-03-01: 200 mg via INTRAVENOUS
  Filled 2017-03-01: qty 20

## 2017-03-01 MED ORDER — HEPARIN SOD (PORK) LOCK FLUSH 100 UNIT/ML IV SOLN
500.0000 [IU] | Freq: Once | INTRAVENOUS | Status: AC
  Administered 2017-03-01: 500 [IU] via INTRAVENOUS
  Filled 2017-03-01: qty 5

## 2017-03-01 MED ORDER — DIPHENHYDRAMINE HCL 50 MG/ML IJ SOLN
25.0000 mg | Freq: Once | INTRAMUSCULAR | Status: AC
  Administered 2017-03-01: 25 mg via INTRAVENOUS
  Filled 2017-03-01: qty 1

## 2017-03-01 MED ORDER — SODIUM CHLORIDE 0.9 % IV SOLN
50.0000 mg/m2 | Freq: Once | INTRAVENOUS | Status: AC
  Administered 2017-03-01: 102 mg via INTRAVENOUS
  Filled 2017-03-01: qty 17

## 2017-03-01 MED ORDER — MORPHINE SULFATE (CONCENTRATE) 20 MG/ML PO SOLN: 5.0000 mg | ORAL | 0 refills | Status: DC | PRN

## 2017-03-01 NOTE — Progress Notes (Signed)
Nutrition Follow-up:  Patient with esophageal cancer and receiving chemotherapy.  J-tube placed on 11/6 (16 red robinson catheter placed).   Noted hospital admission for leaking around J-tube this week.  Patient reports being referred to another surgeon about tube.  Patient frustrated.   Patient is tolerating vital 1.5 (6 cartons per day), running low on supply from home healthcare.  Reports no issues with nausea.  Reports 1 watery bowel movement typically every am.  Takes imodium.  Has not tried banana flakes.    Patient reports orally not taking much.  Scared to take food by mouth due to nausea.  Reports that he is drinking water well.  Reports taste of foods are off as well.  Did try some gingerale as well but did not go as well as water.  Has tried broth and ice cream but got nauseated, but had not taken nausea medications.   Medications: reviewed  Labs: reviewed  Anthropometrics:   Weight is stable at 180 lb   Estimated Energy Needs  Kcals: 2050-2460 calories/d Protein: 102-123 g/d Fluid: 2.4 L/d  NUTRITION DIAGNOSIS: Inadequate oral intake continues    MALNUTRITION DIAGNOSIS: continue to monitor   INTERVENTION:   Continue vital 1.5 (6 cartons per day) as tolerated via J-tube.  MD to determine plan of care regarding tube.   Encouraged patient to take nausea medication at least 30 minutes to 1 hour prior to trying something to eat.  Discussed clear liquids, full liquids and pureeing foods to try.   Patient not taking enough orally to meet nutritional needs at this time.  If patient not able to keep J-tube (stop leaking) then would need to reconsider starting TPN to maintain nutrition until able to eat more orally.    Contact [facility] about needing formula delivered  MONITORING, EVALUATION, GOAL: weight trends, intake, TF   NEXT VISIT: phone call on 12/27 [phone])  Lizann Edelman B. Zenia Resides, [place], [place] Registered Dietitian [phone] (pager)

## 2017-03-01 NOTE — Progress Notes (Signed)
Hematology/Oncology Consult note [facility]  [phone] [phone]  Patient Care Team: Idelle Crouch, MD as PCP - General (Internal Medicine) Clent Jacks, RN as Registered Nurse Grace Isaac, MD as Consulting Physician (Cardiothoracic Surgery) Sindy Guadeloupe, MD as Consulting Physician (Oncology) Noreene Filbert, MD as Referring Physician (Radiation Oncology)   Name of the patient: Casey Acevedo  [id number]  Jul 12, 1940   Date of visit: 03/01/17  Diagnosis-non metastatic esophageal cancer [id number] [facility]   Chief complaint/ Reason for visit-on treatment assessment prior to cycle # 6 of weekly carbotaxol  Heme/Onc history:1. Patient is a 76 year old gentleman with no significant past medical history and takes no medications he has a remote history of smokingduring his college days and quit smoking thereafter. No history of any alcohol intake. He was recently admitted to the hospital on 12/06/2016 with symptoms of progressive dysphagia which he states has been going on since the starting of September. His dysphagia got to the point that he began to have pain even on swallowing icecream. He underwent EGD at that time which showed but high 2 cm long stricture 2 cm above the GE junction. Biopsies at that time were negative for malignancy. Given that he was unable to eat he was started on TPN at that time and plan was to get a repeat EGD with possible biopsy  2. Patient underwent repeat EGD on 12/26/2016 where he was again noted to have a lower esophageal stricture which reduced the lumen to less than 3-4 mm. There was no evidence of Barrett's esophagus. 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 Shortly after the stricture was dilated the stricture did close up significantly. Postprocedure patient complained of abdominal pain and  there was a concern for perforation and hence a repeat CT abdomen was obtained which did not show any evidence of perforation  3. CT abdomen pelvis as well as CT chest with contrast on 10/01/2018showed: 7 mm sub pleural nodule in the lingula. No evidence of axillary mediastinal or hilar adenopathy.1.3 cm soft tissue nodule adjacent to the GE junctionwhich is favored to represent an enlarged lymph node potentially active or metastatic in etiology. Narrowing involving thickening of the distal esophagus compatible with known distal esophageal stricture  4. He lives with his wife and is independent of his ADLs and IADLs. Besides dysphagia patient reports no loss of appetite or unintentional weight loss over the last few months. He denies any pain  5.[place] been deemed to be a potential surgical candidate in the future. Plan for now is to proceed with concurrent chemoradiation followed by EUS upon completion of treatment given difficulty with the  second EGD  6. Chemo/RT started on 01/19/17    Interval history-patient was seen by Dr. Hassell Done for leakage problems around his J-tube.  He was told that there is nothing much that that can be done for this problem other than trying to replace the tube or other measures such as a balloon around the J-tube.  Patient does not have any significant side effects from the chemoradiation per se but is more tired and in pain because of the discomfort around the J-tube site and the constant leakage.  ECOG PS- 1 Pain scale- 4 Opioid associated constipation- no  Review of systems- Review of Systems  Constitutional: Positive for malaise/fatigue. Negative for chills,  fever and weight loss.  HENT: Negative for congestion, ear discharge and nosebleeds.   Eyes: Negative for blurred vision.  Respiratory: Negative for cough, hemoptysis, sputum production, shortness of breath and wheezing.     Cardiovascular: Negative for chest pain, palpitations, orthopnea and claudication.  Gastrointestinal: Negative for abdominal pain, blood in stool, constipation, diarrhea, heartburn, melena, nausea and vomiting.       Pain around J-tube site  Genitourinary: Negative for dysuria, flank pain, frequency, hematuria and urgency.  Musculoskeletal: Negative for back pain, joint pain and myalgias.  Skin: Negative for rash.  Neurological: Negative for dizziness, tingling, focal weakness, seizures, weakness and headaches.  Endo/Heme/Allergies: Does not bruise/bleed easily.  Psychiatric/Behavioral: Negative for depression and suicidal ideas. The patient does not have insomnia.       No Known Allergies   Past Medical History:  Diagnosis Date  . Cancer (HCC)    esophageal  . Dysphagia   . GERD (gastroesophageal reflux disease)      Past Surgical History:  Procedure Laterality Date  . CHOLECYSTECTOMY    . ESOPHAGOGASTRODUODENOSCOPY (EGD) WITH PROPOFOL N/A 12/07/2016   Procedure: ESOPHAGOGASTRODUODENOSCOPY (EGD) WITH PROPOFOL;  Surgeon: Jonathon Bellows, MD;  Location: [facility] ENDOSCOPY;  Service: Gastroenterology;  Laterality: N/A;  . ESOPHAGOGASTRODUODENOSCOPY (EGD) WITH PROPOFOL N/A 12/26/2016   Procedure: ESOPHAGOGASTRODUODENOSCOPY (EGD) WITH PROPOFOL WITH DILATION;  Surgeon: Jonathon Bellows, MD;  Location: [facility] ENDOSCOPY;  Service: Gastroenterology;  Laterality: N/A;  . EYE SURGERY    . GASTROJEJUNOSTOMY N/A 01/16/2017   Procedure: LAPROSCOPIC ASSIST FEEDING JEJUNOSTOMY TUBE;  Surgeon: Johnathan Hausen, MD;  Location: [facility] ORS;  Service: General;  Laterality: N/A;  . IR FLUORO GUIDE PORT INSERTION RIGHT  01/10/2017  . PICC LINE INSERTION Right     Social History   Socioeconomic History  . Marital status: Married    Spouse name: Not on file  . Number of children: Not on file  . Years of education: Not on file  . Highest education level: Not on file  Social Needs  . Financial resource strain: Not  on file  . Food insecurity - worry: Not on file  . Food insecurity - inability: Not on file  . Transportation needs - medical: Not on file  . Transportation needs - non-medical: Not on file  Occupational History  . Not on file  Tobacco Use  . Smoking status: Never Smoker  . Smokeless tobacco: Never Used  Substance and Sexual Activity  . Alcohol use: No  . Drug use: No  . Sexual activity: Not Currently  Other Topics Concern  . Not on file  Social History Narrative   Independent at baseline. Ambulatory    Family History  Problem Relation Age of Onset  . Heart failure Father   . Prostate cancer Neg Hx   . Bladder Cancer Neg Hx   . Kidney cancer Neg Hx      Current Outpatient Medications:  .  cephALEXin (KEFLEX) 250 MG/5ML suspension, Take 10 mLs (500 mg total) by mouth 3 (three) times daily., Disp: 100 mL, Rfl: 0 .  dexamethasone (DECADRON) 4 MG tablet, Take 8 mg by mouth daily. Start the day after chemo for two days, Disp: , Rfl:  .  dexamethasone 0.5 MG/5ML elixir, 80 mLs (8 mg total) by Per J Tube route daily. Start the day after chemotherapy and take for 2 days (after every chemotherapy treatment) (Patient not taking: Reported on 02/25/2017), Disp: 320 mL, Rfl: 3 .  diphenoxylate-atropine (LOMOTIL) 2.5-0.025 MG tablet, Take 1 tablet 4 (  four) times daily as needed by mouth for diarrhea or loose stools., Disp: 120 tablet, Rfl: 0 .  hydrocortisone cream-nystatin cream-zinc oxide, Apply 1 application 4 (four) times daily topically., Disp: 4 oz, Rfl: 3 .  lidocaine-prilocaine (EMLA) cream, APPLY TO AFFECTED AREA ONCE, Disp: , Rfl: 3 .  loperamide (IMODIUM) 2 MG capsule, Take 1 capsule (2 mg total) by mouth as needed for diarrhea or loose stools (take two tablests after the first loose stool, then 1 tablet after each loose stool)., Disp: 30 capsule, Rfl: 0 .  Nutritional Supplements (FEEDING SUPPLEMENT, VITAL 1.5 CAL,) LIQD, Give vital 1.5 via continuous pump at 29ml/hr for 22 hours.   Flush with 249ml of water before starting tube feeding and after stopping tube feeding.  Provide additional 260ml of water 2 other times during waking hours via J tube., Disp: 1540 mL, Rfl: 12 .  nystatin (MYCOSTATIN/NYSTOP) powder, Apply topically 2 (two) times daily. Apply to excoriated skin around J Tube. Cover with gauze, Disp: 15 g, Rfl: 0 .  OLANZapine (ZYPREXA) 10 MG tablet, Take 1 tablet (10 mg total) by mouth at bedtime. (Patient not taking: Reported on 02/25/2017), Disp: 30 tablet, Rfl: 2 .  ondansetron (ZOFRAN) 8 MG tablet, Take 1 tablet (8 mg total) by mouth 2 (two) times daily as needed for refractory nausea / vomiting. Start on day 3 after chemo., Disp: 30 tablet, Rfl: 1 .  ondansetron (ZOFRAN-ODT) 4 MG disintegrating tablet, TAKE 1 TABLET BY MOUTH EVERY 8 HOURS AS NEEDED FOR NAUSEA AND VOMITING, Disp: , Rfl: 0 .  prochlorperazine (COMPAZINE) 10 MG tablet, TAKE 1 TABLET (10 MG TOTAL) BY MOUTH EVERY 6 (SIX) HOURS AS NEEDED (NAUSEA OR VOMITING)., Disp: , Rfl: 1 No current facility-administered medications for this visit.   Facility-Administered Medications Ordered in Other Visits:  .  0.9 %  sodium chloride infusion, , Intravenous, Once, Sindy Guadeloupe, MD .  heparin lock flush 100 unit/mL, 500 Units, Intravenous, Once, Sindy Guadeloupe, MD .  ondansetron (ZOFRAN) 8 mg, dexamethasone (DECADRON) 10 mg in sodium chloride 0.9 % 50 mL IVPB, , Intravenous, Once, Sindy Guadeloupe, MD .  sodium chloride flush (NS) 0.9 % injection 10 mL, 10 mL, Intravenous, PRN, Sindy Guadeloupe, MD  Physical exam:  Vitals:   03/01/17 0920  BP: 125/77  Pulse: (!) 112  Resp: 18  Temp: (!) 97.4 F (36.3 C)  TempSrc: Tympanic  Weight: 182 lb (82.6 kg)   Physical Exam  Constitutional: He is oriented to person, place, and time and well-developed, well-nourished, and in no distress.  HENT:  Head: Normocephalic and atraumatic.  Eyes: EOM are normal. Pupils are equal, round, and reactive to light.  Neck: Normal  range of motion.  Cardiovascular: Normal rate, regular rhythm and normal heart sounds.  Pulmonary/Chest: Effort normal and breath sounds normal.  Abdominal: Soft. Bowel sounds are normal.  J-tube in place and properly positioned.  However there is constant leakage noted around the J-tube.  Skin around the J-tube is erythematous due to constant drainage  Neurological: He is alert and oriented to person, place, and time.  Skin: Skin is warm and dry.     CMP Latest Ref Rng & Units 03/01/2017  Glucose 65 - 99 mg/dL 112(H)  BUN 6 - 20 mg/dL 27(H)  Creatinine 0.61 - 1.24 mg/dL 0.74  Sodium 135 - 145 mmol/L 135  Potassium 3.5 - 5.1 mmol/L 3.8  Chloride 101 - 111 mmol/L 105  CO2 22 - 32 mmol/L 23  Calcium 8.9 - 10.3 mg/dL 7.8(L)  Total Protein 6.5 - 8.1 g/dL 5.4(L)  Total Bilirubin 0.3 - 1.2 mg/dL 0.9  Alkaline Phos 38 - 126 U/L 110  AST 15 - 41 U/L 27  ALT 17 - 63 U/L 46   CBC Latest Ref Rng & Units 03/01/2017  WBC 3.8 - 10.6 K/uL 10.4  Hemoglobin 13.0 - 18.0 g/dL 11.7(L)  Hematocrit 40.0 - 52.0 % 34.8(L)  Platelets 150 - 440 K/uL 170    No images are attached to the encounter.  Ct Abdomen Pelvis W Contrast  Result Date: 02/25/2017 CLINICAL DATA:  Abdominal pain, fever.  Esophageal cancer EXAM: CT ABDOMEN AND PELVIS WITH CONTRAST TECHNIQUE: Multidetector CT imaging of the abdomen and pelvis was performed using the standard protocol following bolus administration of intravenous contrast. CONTRAST:  1110mL ISOVUE-300 IOPAMIDOL (ISOVUE-300) INJECTION 61% COMPARISON:  PET CT 01/03/2017 FINDINGS: Lower chest: Distal esophageal mass is again noted as seen on prior PET CT. Right base atelectasis. No effusions. Hepatobiliary: Small hypodensity in the left hepatic dome measures 10 mm, most compatible with small cysts. No other focal hepatic abnormality. Prior cholecystectomy. Pancreas: No focal abnormality or ductal dilatation. 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 Adrenals/Urinary Tract: No adrenal abnormality. No focal renal abnormality. No stones or hydronephrosis. Urinary bladder is unremarkable. Stomach/Bowel: Jejunostomy tube is in place. Stomach, large and small bowel grossly unremarkable. Vascular/Lymphatic: Aortic and iliac calcifications. No aneurysm or adenopathy. Reproductive: Mild prostate prominence. Other: No free fluid or free air. Musculoskeletal: No acute bony abnormality. IMPRESSION: Distal esophageal mass again noted, stable since prior PET CT. Right base atelectasis. No acute findings in the abdomen or pelvis. Electronically Signed   By: Rolm Baptise M.D.   On: 02/25/2017 19:34     Assessment and plan- Patient is a 76 y.o. male non metastatic esophageal cancer [id number] [facility] here for on treatment assessment prior to cycle # 6 of weekly carbo/taxol  Counts okay to proceed with cycle #6 of weekly carbotaxol today.  This will be his last chemo when he finishes radiation on 03/05/2017.  I will touch base with Dr. Servando Snare regarding repeat imaging as well as repeat EUS.  J-tube issues: I am concerned about constant drainage around the J-tube site that would ultimately lead to excoriation of his skin and possible infection.  Also patient has not been able to get his do nutrition because of constant leakage around the J-tube site.  Options at this time would be to replace the J-tube but I am concerned that this would lead to same problems down the line or transition him back to TPN and get his J-tube out.  Patient has had problems with abnormal LFTs while he was on TPN.  I am hoping that if he has been deemed to be a surgical candidate down the line we can get him off TPN in about 2-3 months time.  Hopefully in the meantime if his swallowing improves he can improve his oral intake and may become off the TPN.  If surgery is planned down the line G-tube does not seem to be a good option at this time.  I will discuss all this with Dr.  Servando Snare as well and get back with the patient regarding the best way to proceed  Patient has been requiring IV fluids and feels better with Decadron.  He will therefore come back on 03/05/2017 for 1 L of normal saline and 10 mg of IV Decadron.  I will  see him back on 03/20/2017 with blood work.  We will plan to get PET/CT scan about 5-6 weeks after completion of radiation treatment.     Visit Diagnosis 1. Malignant neoplasm of lower third of esophagus (HCC)   2. Encounter for antineoplastic chemotherapy   3. Jejunostomy tube leak (HCC)      Dr. Randa Evens, MD, MPH [facility] Pager- [phone] 03/01/2017 12:12 PM

## 2017-03-02 ENCOUNTER — Ambulatory Visit
Admission: RE | Admit: 2017-03-02 | Discharge: 2017-03-02 | Disposition: A | Discharge status: Home / Self Care | Payer: Medicare Other | Source: Ambulatory Visit | Attending: Radiation Oncology | Admitting: Radiation Oncology

## 2017-03-02 ENCOUNTER — Ambulatory Visit: Payer: Medicare Other

## 2017-03-02 ENCOUNTER — Inpatient Hospital Stay: Payer: Medicare Other

## 2017-03-02 VITALS — BP 141/80 | HR 94 | Temp 96.9°F | Resp 18

## 2017-03-02 DIAGNOSIS — C155 Malignant neoplasm of lower third of esophagus: Secondary | ICD-10-CM

## 2017-03-02 DIAGNOSIS — Z95828 Presence of other vascular implants and grafts: Secondary | ICD-10-CM

## 2017-03-02 DIAGNOSIS — Z5111 Encounter for antineoplastic chemotherapy: Secondary | ICD-10-CM | POA: Diagnosis not present

## 2017-03-02 DIAGNOSIS — E860 Dehydration: Secondary | ICD-10-CM

## 2017-03-02 LAB — CULTURE, BLOOD (ROUTINE X 2)
CULTURE: NO GROWTH
CULTURE: NO GROWTH
Special Requests: ADEQUATE

## 2017-03-02 MED ORDER — SODIUM CHLORIDE 0.9 % IV SOLN
Freq: Once | INTRAVENOUS | Status: AC
  Administered 2017-03-02: 11:00:00 via INTRAVENOUS
  Filled 2017-03-02: qty 1000

## 2017-03-02 MED ORDER — HEPARIN SOD (PORK) LOCK FLUSH 100 UNIT/ML IV SOLN
500.0000 [IU] | Freq: Once | INTRAVENOUS | Status: AC
  Administered 2017-03-02: 500 [IU] via INTRAVENOUS

## 2017-03-02 MED ORDER — SODIUM CHLORIDE 0.9% FLUSH
10.0000 mL | INTRAVENOUS | Status: DC | PRN
  Administered 2017-03-02: 10 mL via INTRAVENOUS
  Filled 2017-03-02: qty 10

## 2017-03-03 ENCOUNTER — Other Ambulatory Visit: Payer: Self-pay | Admitting: *Deleted

## 2017-03-03 ENCOUNTER — Inpatient Hospital Stay: Payer: Medicare Other

## 2017-03-03 ENCOUNTER — Observation Stay (HOSPITAL_COMMUNITY)
Admission: EM | Admit: 2017-03-03 | Discharge: 2017-03-05 | Disposition: A | Discharge status: Home / Self Care | Payer: Medicare Other | Attending: Internal Medicine | Admitting: Internal Medicine

## 2017-03-03 ENCOUNTER — Encounter (HOSPITAL_COMMUNITY): Payer: Self-pay | Admitting: *Deleted

## 2017-03-03 DIAGNOSIS — Z934 Other artificial openings of gastrointestinal tract status: Secondary | ICD-10-CM

## 2017-03-03 DIAGNOSIS — D61818 Other pancytopenia: Secondary | ICD-10-CM | POA: Insufficient documentation

## 2017-03-03 DIAGNOSIS — K9413 Enterostomy malfunction: Principal | ICD-10-CM

## 2017-03-03 DIAGNOSIS — C155 Malignant neoplasm of lower third of esophagus: Secondary | ICD-10-CM | POA: Diagnosis not present

## 2017-03-03 DIAGNOSIS — E860 Dehydration: Secondary | ICD-10-CM

## 2017-03-03 DIAGNOSIS — Z6826 Body mass index (BMI) 26.0-26.9, adult: Secondary | ICD-10-CM | POA: Insufficient documentation

## 2017-03-03 DIAGNOSIS — E43 Unspecified severe protein-calorie malnutrition: Secondary | ICD-10-CM | POA: Insufficient documentation

## 2017-03-03 DIAGNOSIS — R000 Tachycardia, unspecified: Secondary | ICD-10-CM | POA: Diagnosis not present

## 2017-03-03 DIAGNOSIS — R110 Nausea: Secondary | ICD-10-CM | POA: Insufficient documentation

## 2017-03-03 DIAGNOSIS — R531 Weakness: Secondary | ICD-10-CM | POA: Diagnosis not present

## 2017-03-03 DIAGNOSIS — D899 Disorder involving the immune mechanism, unspecified: Secondary | ICD-10-CM | POA: Diagnosis not present

## 2017-03-03 DIAGNOSIS — Z5111 Encounter for antineoplastic chemotherapy: Secondary | ICD-10-CM | POA: Diagnosis not present

## 2017-03-03 DIAGNOSIS — Z79899 Other long term (current) drug therapy: Secondary | ICD-10-CM | POA: Diagnosis not present

## 2017-03-03 LAB — URINALYSIS, ROUTINE W REFLEX MICROSCOPIC
Bilirubin Urine: NEGATIVE
Glucose, UA: NEGATIVE mg/dL
Hgb urine dipstick: NEGATIVE
Ketones, ur: NEGATIVE mg/dL
LEUKOCYTES UA: NEGATIVE
NITRITE: NEGATIVE
PH: 6 (ref 5.0–8.0)
Protein, ur: NEGATIVE mg/dL
SPECIFIC GRAVITY, URINE: 1.015 (ref 1.005–1.030)

## 2017-03-03 LAB — CBC
HCT: 32.2 % — ABNORMAL LOW (ref 39.0–52.0)
Hemoglobin: 11.2 g/dL — ABNORMAL LOW (ref 13.0–17.0)
MCH: 30.5 pg (ref 26.0–34.0)
MCHC: 34.8 g/dL (ref 30.0–36.0)
MCV: 87.7 fL (ref 78.0–100.0)
PLATELETS: 88 10*3/uL — AB (ref 150–400)
RBC: 3.67 MIL/uL — AB (ref 4.22–5.81)
RDW: 16.7 % — AB (ref 11.5–15.5)
WBC: 3.6 10*3/uL — ABNORMAL LOW (ref 4.0–10.5)

## 2017-03-03 LAB — COMPREHENSIVE METABOLIC PANEL
ALT: 51 U/L (ref 17–63)
ANION GAP: 7 (ref 5–15)
AST: 26 U/L (ref 15–41)
Albumin: 2.6 g/dL — ABNORMAL LOW (ref 3.5–5.0)
Alkaline Phosphatase: 91 U/L (ref 38–126)
BUN: 24 mg/dL — ABNORMAL HIGH (ref 6–20)
CHLORIDE: 102 mmol/L (ref 101–111)
CO2: 23 mmol/L (ref 22–32)
Calcium: 7.4 mg/dL — ABNORMAL LOW (ref 8.9–10.3)
Creatinine, Ser: 0.76 mg/dL (ref 0.61–1.24)
GFR calc non Af Amer: >60 mL/min (ref >60)
Glucose, Bld: 105 mg/dL — ABNORMAL HIGH (ref 65–99)
POTASSIUM: 3.9 mmol/L (ref 3.5–5.1)
SODIUM: 132 mmol/L — AB (ref 135–145)
Total Bilirubin: 1.4 mg/dL — ABNORMAL HIGH (ref 0.3–1.2)
Total Protein: 4.9 g/dL — ABNORMAL LOW (ref 6.5–8.1)

## 2017-03-03 LAB — AEROBIC/ANAEROBIC CULTURE (SURGICAL/DEEP WOUND)

## 2017-03-03 LAB — AEROBIC/ANAEROBIC CULTURE W GRAM STAIN (SURGICAL/DEEP WOUND): Gram Stain: NONE SEEN

## 2017-03-03 MED ORDER — ENOXAPARIN SODIUM 40 MG/0.4ML ~~LOC~~ SOLN
40.0000 mg | SUBCUTANEOUS | Status: DC
  Administered 2017-03-03 – 2017-03-04 (×2): 40 mg via SUBCUTANEOUS
  Filled 2017-03-03 (×2): qty 0.4

## 2017-03-03 MED ORDER — MORPHINE SULFATE (CONCENTRATE) 10 MG/0.5ML PO SOLN: 5.0000 mg | ORAL | Status: DC | PRN

## 2017-03-03 MED ORDER — ONDANSETRON HCL 4 MG/2ML IJ SOLN
4.0000 mg | Freq: Once | INTRAMUSCULAR | Status: AC
  Administered 2017-03-03: 4 mg via INTRAVENOUS
  Filled 2017-03-03: qty 2

## 2017-03-03 MED ORDER — SODIUM CHLORIDE 0.9 % IV BOLUS (SEPSIS)
1000.0000 mL | Freq: Once | INTRAVENOUS | Status: AC
  Administered 2017-03-03: 1000 mL via INTRAVENOUS

## 2017-03-03 MED ORDER — SODIUM CHLORIDE 0.9% FLUSH
10.0000 mL | INTRAVENOUS | Status: DC | PRN
  Administered 2017-03-03: 10 mL via INTRAVENOUS
  Filled 2017-03-03: qty 10

## 2017-03-03 MED ORDER — ACETAMINOPHEN 650 MG RE SUPP: 650.0000 mg | Freq: Four times a day (QID) | RECTAL | Status: DC | PRN

## 2017-03-03 MED ORDER — NYSTATIN 100000 UNIT/GM EX POWD
Freq: Two times a day (BID) | CUTANEOUS | Status: DC
  Administered 2017-03-04: 21:00:00 via TOPICAL
  Filled 2017-03-03: qty 15

## 2017-03-03 MED ORDER — SODIUM CHLORIDE 0.9 % IV SOLN
Freq: Once | INTRAVENOUS | Status: AC
  Administered 2017-03-03: 11:00:00 via INTRAVENOUS

## 2017-03-03 MED ORDER — SODIUM CHLORIDE 0.9 % IV SOLN
INTRAVENOUS | Status: DC
  Administered 2017-03-03 – 2017-03-04 (×3): via INTRAVENOUS

## 2017-03-03 MED ORDER — ACETAMINOPHEN 325 MG PO TABS: 650.0000 mg | ORAL_TABLET | Freq: Four times a day (QID) | ORAL | Status: DC | PRN

## 2017-03-03 MED ORDER — ONDANSETRON HCL 4 MG PO TABS: 4.0000 mg | ORAL_TABLET | Freq: Four times a day (QID) | ORAL | Status: DC | PRN

## 2017-03-03 MED ORDER — HEPARIN SOD (PORK) LOCK FLUSH 100 UNIT/ML IV SOLN
500.0000 [IU] | Freq: Once | INTRAVENOUS | Status: AC
  Administered 2017-03-03: 500 [IU] via INTRAVENOUS
  Filled 2017-03-03: qty 5

## 2017-03-03 MED ORDER — ONDANSETRON HCL 4 MG/2ML IJ SOLN: 4.0000 mg | Freq: Four times a day (QID) | INTRAMUSCULAR | Status: DC | PRN

## 2017-03-03 NOTE — H&P (Addendum)
History and Physical    Casey Acevedo MWN:[id number] DOB: 02/16/1941 DOA: 03/03/2017  PCP: Idelle Crouch, MD   Patient coming from: Home  I have personally briefly reviewed patient's old medical records in [place]  Chief Complaint: Weakness and J tube leakage  HPI: Casey Acevedo is a 76 y.o. 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  He continued to have leak from his jejunostomy site and was seen by his oncologist 2 days ago and was treated with intravenous fluids.  He continued to feel very weak and tired; he was advised by his oncologist today to present to [facility] emergency room as his original jejunostomy tube surgery was done at [facility].  He is scheduled to have a procedure done by interventional radiology this coming Monday.  He denies any fever, nausea, vomiting, abdominal pain, diarrhea.  He still gets feeding through the jejunostomy tube but much of it comes out as per him in the family.  Patient can only swallow some liquids.  ED Course: Patient was tachycardic and was given intravenous fluids.  Hospitalist service was called to evaluate the patient  Review of Systems: As per HPI otherwise 10 point review of systems negative.    Past Medical History:  Diagnosis Date  . Cancer (HCC)    esophageal  . Dysphagia   . GERD (gastroesophageal reflux disease)     Past Surgical History:  Procedure Laterality Date  . ABDOMINAL HYSTERECTOMY    . CHOLECYSTECTOMY    . ESOPHAGOGASTRODUODENOSCOPY (EGD) WITH PROPOFOL N/A 12/07/2016   Procedure: ESOPHAGOGASTRODUODENOSCOPY (EGD) WITH PROPOFOL;  Surgeon: Jonathon Bellows, MD;  Location: [facility] ENDOSCOPY;  Service: Gastroenterology;   Laterality: N/A;  . ESOPHAGOGASTRODUODENOSCOPY (EGD) WITH PROPOFOL N/A 12/26/2016   Procedure: ESOPHAGOGASTRODUODENOSCOPY (EGD) WITH PROPOFOL WITH DILATION;  Surgeon: Jonathon Bellows, MD;  Location: [facility] ENDOSCOPY;  Service: Gastroenterology;  Laterality: N/A;  . EYE SURGERY    . GASTROJEJUNOSTOMY N/A 01/16/2017   Procedure: LAPROSCOPIC ASSIST FEEDING JEJUNOSTOMY TUBE;  Surgeon: Johnathan Hausen, MD;  Location: [facility] ORS;  Service: General;  Laterality: N/A;  . IR FLUORO GUIDE PORT INSERTION RIGHT  01/10/2017  . PICC LINE INSERTION Right    Social history  reports that  has never smoked. he has never used smokeless tobacco. He reports that he does not drink alcohol or use drugs.  -Lives at home with family  No Known Allergies  Family History  Problem Relation Age of Onset  . Heart failure Father   . Prostate cancer Neg Hx   . Bladder Cancer Neg Hx   . Kidney cancer Neg Hx     Prior to Admission medications   Medication Sig Start Date End Date Taking? Authorizing Provider  dexamethasone (DECADRON) 4 MG tablet Take 8 mg by mouth daily. Start the day after chemo for two days    [provider]  hydrocortisone cream-nystatin cream-zinc oxide Apply 1 application 4 (four) times daily topically. 01/29/17   Verlon Au, NP  lidocaine-prilocaine (EMLA) cream APPLY TO AFFECTED AREA ONCE 01/05/17   [provider]  loperamide (IMODIUM) 2 MG capsule Take 1 capsule (2 mg total) by mouth as needed for diarrhea or loose stools (take two tablests after the first loose stool, then 1 tablet after each loose stool). 02/20/17  Sindy Guadeloupe, MD  morphine (ROXANOL) 20 MG/ML concentrated solution Take 0.25 mLs (5 mg total) by mouth every 4 (four) hours as needed for severe pain. 03/01/17   Sindy Guadeloupe, MD  Nutritional Supplements (FEEDING SUPPLEMENT, VITAL 1.5 CAL,) LIQD Give vital 1.5 via continuous pump at 48ml/hr for 22 hours.  Flush with 255ml of water before starting tube feeding and  after stopping tube feeding.  Provide additional 237ml of water 2 other times during waking hours via J tube. 01/25/17   Sindy Guadeloupe, MD  nystatin (MYCOSTATIN/NYSTOP) powder Apply topically 2 (two) times daily. Apply to excoriated skin around J Tube. Cover with gauze 02/26/17   Sudini, Alveta Heimlich, MD  ondansetron (ZOFRAN-ODT) 4 MG disintegrating tablet TAKE 1 TABLET BY MOUTH EVERY 8 HOURS AS NEEDED FOR NAUSEA AND VOMITING 01/04/17   [provider]  prochlorperazine (COMPAZINE) 10 MG tablet TAKE 1 TABLET (10 MG TOTAL) BY MOUTH EVERY 6 (SIX) HOURS AS NEEDED (NAUSEA OR VOMITING). 01/11/17   [provider]    Physical Exam: Vitals:   03/03/17 1317 03/03/17 1530 03/03/17 1630  BP: 106/75 (!) 149/82 132/71  Pulse: (!) 120 99 93  Resp: 18 18 16   Temp: 97.9 F (36.6 C)    TempSrc: Oral    SpO2: 92% 99% 96%  Weight: 81.6 kg (180 lb)    Height: 5\' 9"  (1.753 m)      Constitutional: NAD, calm, comfortable Vitals:   03/03/17 1317 03/03/17 1530 03/03/17 1630  BP: 106/75 (!) 149/82 132/71  Pulse: (!) 120 99 93  Resp: 18 18 16   Temp: 97.9 F (36.6 C)    TempSrc: Oral    SpO2: 92% 99% 96%  Weight: 81.6 kg (180 lb)    Height: 5\' 9"  (1.753 m)     Eyes: PERRL, lids and conjunctivae normal ENMT: Mucous membranes are dry.  Neck: normal, supple, no masses, no thyromegaly Respiratory: Bilateral decreased breath sounds at bases. Normal respiratory effort. No accessory muscle use.  Cardiovascular: S1-S2 positive, regular rate and rhythm, no murmurs. No extremity edema.  Abdomen: no tenderness, no masses palpated.  Midline dressing with J-tube present with leakage; mild surrounding skin erythema. no hepatosplenomegaly. Bowel sounds positive.  Musculoskeletal: no clubbing / cyanosis. No joint deformity upper and lower extremities.  Skin: no other rashes, lesions, ulcers. No induration Neurologic: CN 2-12 grossly intact.  Moving extremities.  Alert awake oriented Psychiatric: Normal  judgment and insight. Alert and oriented x 3. Normal mood.   Labs on Admission: I have personally reviewed following labs and imaging studies  CBC: Recent Labs  Lab 02/25/17 1630 02/26/17 0501 03/01/17 0840 03/03/17 1425  WBC 1.9* 1.8* 10.4 3.6*  NEUTROABS 1.6  --  9.4*  --   HGB 12.7* 11.1* 11.7* 11.2*  HCT 36.6* 32.5* 34.8* 32.2*  MCV 87.0 87.6 88.8 87.7  PLT 201 191 170 88*   Basic Metabolic Panel: Recent Labs  Lab 02/25/17 1630 02/26/17 0501 03/01/17 0840 03/03/17 1425  NA 136 135 135 132*  K 4.4 4.2 3.8 3.9  CL 103 106 105 102  CO2 24 24 23 23   GLUCOSE 146* 111* 112* 105*  BUN 29* 24* 27* 24*  CREATININE 0.66 0.68 0.74 0.76  CALCIUM 8.5* 7.8* 7.8* 7.4*   GFR: Estimated Creatinine Clearance: 78.6 mL/min (by C-G formula based on SCr of 0.76 mg/dL). Liver Function Tests: Recent Labs  Lab 02/25/17 1630 02/26/17 0501 03/01/17 0840 03/03/17 1425  AST 24 17 27 26   ALT 42  35 46 51  ALKPHOS 98 80 110 91  BILITOT 1.4* 1.3* 0.9 1.4*  PROT 5.9* 4.7* 5.4* 4.9*  ALBUMIN 2.9* 2.5* 2.8* 2.6*   No results for input(s): LIPASE, AMYLASE in the last 168 hours. No results for input(s): AMMONIA in the last 168 hours. Coagulation Profile: No results for input(s): INR, PROTIME in the last 168 hours. Cardiac Enzymes: No results for input(s): CKTOTAL, CKMB, CKMBINDEX, TROPONINI in the last 168 hours. BNP (last 3 results) No results for input(s): PROBNP in the last 8760 hours. HbA1C: No results for input(s): HGBA1C in the last 72 hours. CBG: No results for input(s): GLUCAP in the last 168 hours. Lipid Profile: No results for input(s): CHOL, HDL, LDLCALC, TRIG, CHOLHDL, LDLDIRECT in the last 72 hours. Thyroid Function Tests: No results for input(s): TSH, T4TOTAL, FREET4, T3FREE, THYROIDAB in the last 72 hours. Anemia Panel: No results for input(s): VITAMINB12, FOLATE, FERRITIN, TIBC, IRON, RETICCTPCT in the last 72 hours. Urine analysis:    Component Value Date/Time    COLORURINE YELLOW (A) 02/25/2017 2140   APPEARANCEUR CLEAR (A) 02/25/2017 2140   LABSPEC 1.025 02/25/2017 2140   PHURINE 6.0 02/25/2017 2140   GLUCOSEU NEGATIVE 02/25/2017 2140   HGBUR NEGATIVE 02/25/2017 2140   [place] NEGATIVE 02/25/2017 2140   [place] NEGATIVE 02/25/2017 2140   PROTEINUR NEGATIVE 02/25/2017 2140   NITRITE NEGATIVE 02/25/2017 2140   LEUKOCYTESUR NEGATIVE 02/25/2017 2140    Radiological Exams on Admission: No results found.   Assessment/Plan Active Problems:   Dehydration   Malignant neoplasm of lower third of esophagus (HCC)   S/P jejunostomy (HCC)   Dehydration -Probably secondary to jejunostomy tube leak -Continue IV fluids  Persistent jejunostomy tube leak -Recent CAT scan of the abdomen on 02/25/2017 did not show any acute abnormalities -Patient was recently treated with antibiotics for skin cellulitis.  Currently does not need any antibiotics -Consulted general surgery.  IR consult placed as patient was scheduled to have outpatient IR procedure for the J-tube  Esophageal cancer currently undergoing chemotherapy and radiation -Patient was sent to the ED by oncology.  Outpatient follow-up with oncology.  Patient also follows up with Dr. Servando Snare as an outpatient  Pancytopenia -Probably secondary to cancer and chemotherapy -Monitor  Generalized deconditioning -PT eval   DVT prophylaxis: Lovenox Code Status: Full Family Communication: Wife at bedside Disposition Plan: Depends on clinical outcome Consults called: General surgery Admission status: Observation in Medsurg  Severity of Illness: The appropriate patient status for this patient is OBSERVATION. Observation status is judged to be reasonable and necessary in order to provide the required intensity of service to ensure the patient's safety. The patient's presenting symptoms, physical exam findings, and initial radiographic and laboratory data in the context of their medical condition  is felt to place them at decreased risk for further clinical deterioration. Furthermore, it is anticipated that the patient will be medically stable for discharge from the hospital within 2 midnights of admission. The following factors support the patient status of observation.   " The patient's presenting symptoms include weakness. " The physical exam findings include dehydration. " The initial radiographic and laboratory data are dehydration.      Aline August MD [facility] Pager [phone]  If 7PM-7AM, please contact night-coverage [email or web address] Password [facility]  03/03/2017, 4:34 PM

## 2017-03-03 NOTE — ED Triage Notes (Signed)
Pt complains of drainage around his j-tube. Pt states he has been seen multiple times in the ED and by his surgeon. Pt has appointment to have j-tube replaced Monday but Dr. Janese Banks would like pt to be seen in ED due to lack of nutrition and dehydration d/t leakage.

## 2017-03-03 NOTE — Consult Note (Signed)
Surgical Consultation Requesting provider: Dr. Aline August  CC: leaking J tube  HPI: This is a 76 year old gentleman who is being admitted for dehydration in the setting of esophagogastric cancer on chemotherapy/ radiation. He underwent laparoscopic jejunostomy tube placement by Dr. Hassell Done on November 6. The patient states that the tube has been leaking essentially since that time. He has been to the emergency department and admitted several times for this. He had a CT scan on the 16th did not show anything with the tube or underlying inflammation. He does admit that he has to put medications and the tube and therefore it is highly likely that it has become clogged. He was actually scheduled to have it replaced in IR this coming Monday the 24th.  No Known Allergies  Past Medical History:  Diagnosis Date  . Cancer (HCC)    esophageal  . Dysphagia   . GERD (gastroesophageal reflux disease)     Past Surgical History:  Procedure Laterality Date  . ABDOMINAL HYSTERECTOMY    . CHOLECYSTECTOMY    . ESOPHAGOGASTRODUODENOSCOPY (EGD) WITH PROPOFOL N/A 12/07/2016   Procedure: ESOPHAGOGASTRODUODENOSCOPY (EGD) WITH PROPOFOL;  Surgeon: Jonathon Bellows, MD;  Location: [facility] ENDOSCOPY;  Service: Gastroenterology;  Laterality: N/A;  . ESOPHAGOGASTRODUODENOSCOPY (EGD) WITH PROPOFOL N/A 12/26/2016   Procedure: ESOPHAGOGASTRODUODENOSCOPY (EGD) WITH PROPOFOL WITH DILATION;  Surgeon: Jonathon Bellows, MD;  Location: [facility] ENDOSCOPY;  Service: Gastroenterology;  Laterality: N/A;  . EYE SURGERY    . GASTROJEJUNOSTOMY N/A 01/16/2017   Procedure: LAPROSCOPIC ASSIST FEEDING JEJUNOSTOMY TUBE;  Surgeon: Johnathan Hausen, MD;  Location: [facility] ORS;  Service: General;  Laterality: N/A;  . IR FLUORO GUIDE PORT INSERTION RIGHT  01/10/2017  . PICC LINE INSERTION Right     Family History  Problem Relation Age of Onset  . Heart failure Father   . Prostate cancer Neg Hx   . Bladder Cancer Neg Hx   . Kidney cancer Neg Hx      Social History   Socioeconomic History  . Marital status: Married    Spouse name: None  . Number of children: None  . Years of education: None  . Highest education level: None  Social Needs  . Financial resource strain: None  . Food insecurity - worry: None  . Food insecurity - inability: None  . Transportation needs - medical: None  . Transportation needs - non-medical: None  Occupational History  . None  Tobacco Use  . Smoking status: Never Smoker  . Smokeless tobacco: Never Used  Substance and Sexual Activity  . Alcohol use: No  . Drug use: No  . Sexual activity: Not Currently  Other Topics Concern  . None  Social History Narrative   Independent at baseline. Ambulatory    Current Facility-Administered Medications on File Prior to Encounter  Medication Dose Route Frequency Provider Last Rate Last Dose  . 0.9 %  sodium chloride infusion   Intravenous Once Sindy Guadeloupe, MD      . ondansetron (ZOFRAN) 8 mg, dexamethasone (DECADRON) 10 mg in sodium chloride 0.9 % 50 mL IVPB   Intravenous Once Sindy Guadeloupe, MD       Current Outpatient Medications on File Prior to Encounter  Medication Sig Dispense Refill  . dexamethasone (DECADRON) 4 MG tablet 8 mg by Per J Tube route daily. Start the day after chemo for two days     . hydrocortisone cream-nystatin cream-zinc oxide Apply 1 application 4 (four) times daily topically. 4 oz 3  . loperamide (IMODIUM) 2 MG  capsule Take 1 capsule (2 mg total) by mouth as needed for diarrhea or loose stools (take two tablests after the first loose stool, then 1 tablet after each loose stool). (Patient taking differently: 2 mg as needed for diarrhea or loose stools (take two tablests after the first loose stool, then 1 tablet after each loose stool). Per J Tube) 30 capsule 0  . morphine (ROXANOL) 20 MG/ML concentrated solution Take 0.25 mLs (5 mg total) by mouth every 4 (four) hours as needed for severe pain. 30 mL 0  . Nutritional Supplements  (FEEDING SUPPLEMENT, VITAL 1.5 CAL,) LIQD Give vital 1.5 via continuous pump at 11ml/hr for 22 hours.  Flush with 282ml of water before starting tube feeding and after stopping tube feeding.  Provide additional 214ml of water 2 other times during waking hours via J tube. 1540 mL 12  . ondansetron (ZOFRAN-ODT) 4 MG disintegrating tablet TAKE 1 TABLET BY MOUTH EVERY 8 HOURS AS NEEDED FOR NAUSEA AND VOMITING  0  . prochlorperazine (COMPAZINE) 10 MG tablet TAKE 1 TABLET (10 MG TOTAL) BY MOUTH EVERY 6 (SIX) HOURS AS NEEDED (NAUSEA OR VOMITING).  1  . nystatin (MYCOSTATIN/NYSTOP) powder Apply topically 2 (two) times daily. Apply to excoriated skin around J Tube. Cover with gauze 15 g 0    Review of Systems: a complete, 10pt review of systems was completed with pertinent positives and negatives as documented in the HPI  Physical Exam: Vitals:   03/03/17 1700 03/03/17 1829  BP: (!) 150/82 (!) 156/90  Pulse: 95 96  Resp:    Temp:  98.5 F (36.9 C)  SpO2:  96%   Gen: A&Ox3, no distress Head: normocephalic, atraumatic, EOMI, anicteric.  Neck: supple without mass or thyromegaly Chest: unlabored respirations, symmetrical air entry   Cardiovascular: RRR with palpable distal pulses, no pedal edema Abdomen: soft, nondistended, nontender. No mass or organomegaly. There is a red rubber jejunostomy tube in the left upper quadrant. The surrounding skin for a radius of about 10 cm is extremely excoriated and weeping. There is bilious drainage around the tube. Extremities: warm, without edema, no deformities  Neuro: grossly intact Psych: appropriate mood and affect  Skin: warm and dry   CBC Latest Ref Rng & Units 03/03/2017 03/01/2017 02/26/2017  WBC 4.0 - 10.5 K/uL 3.6(L) 10.4 1.8(L)  Hemoglobin 13.0 - 17.0 g/dL 11.2(L) 11.7(L) 11.1(L)  Hematocrit 39.0 - 52.0 % 32.2(L) 34.8(L) 32.5(L)  Platelets 150 - 400 K/uL 88(L) 170 191    CMP Latest Ref Rng & Units 03/03/2017 03/01/2017 02/26/2017  Glucose 65 -  99 mg/dL 105(H) 112(H) 111(H)  BUN 6 - 20 mg/dL 24(H) 27(H) 24(H)  Creatinine 0.61 - 1.24 mg/dL 0.76 0.74 0.68  Sodium 135 - 145 mmol/L 132(L) 135 135  Potassium 3.5 - 5.1 mmol/L 3.9 3.8 4.2  Chloride 101 - 111 mmol/L 102 105 106  CO2 22 - 32 mmol/L 23 23 24   Calcium 8.9 - 10.3 mg/dL 7.4(L) 7.8(L) 7.8(L)  Total Protein 6.5 - 8.1 g/dL 4.9(L) 5.4(L) 4.7(L)  Total Bilirubin 0.3 - 1.2 mg/dL 1.4(H) 0.9 1.3(H)  Alkaline Phos 38 - 126 U/L 91 110 80  AST 15 - 41 U/L 26 27 17   ALT 17 - 63 U/L 51 46 35    Lab Results  Component Value Date   INR 1.04 01/10/2017   INR 1.03 12/06/2016    Imaging: No results found.    A/P: A 76 year old gentleman about 6-1/2 weeks out from jejunostomy placement with persistent leakage  around the tube and subsequent breakdown of the surrounding skin, dehydration.  -Replacing the tube in interventional radiology as the appropriate first step as it is probable that the tube is clogged and that's why it is leaking around it. The other consideration is whether the tube is causing a partial obstruction causing contents of proximal bowel to overflow around the tube through the jejunostomy site. A smaller caliber tube will likely be placed when IR replaces it. If there is any balloon on that tube I would advise against inflating it. -He also needs a wound care consultation to assess the surrounding skin and provide recommendations for preventing further breakdown and helping that skin heal -Medications should never be placed in the jejunostomy tube.  Thank you for involving [place] in this nice man's care. We will follow him peripherally while he is an inpatient.   Romana Juniper, MD [place] [facility], [place] Pager [phone]

## 2017-03-03 NOTE — ED Provider Notes (Signed)
[facility] DEPT Provider Note   CSN: [id number] Arrival date & time: 03/03/17  1252     History   Chief Complaint Chief Complaint  Patient presents with  . Leakage around J-tube    HPI Casey Acevedo is a 76 y.o. male presenting for dehydration.  Pt with a h/o esophageal cancer and dysphagia. Patient states that he has been struggling with frequent dehydration as he has a J-tube that continually leaks.  He was seen by his doctor 2 days ago, and instructed to come back on Monday for IV fluids.  Additionally, patient has appointment on Monday for J-tube replacement/alternative therapy.  Patient called his doctor today, as he was had increased weakness and dehydration.  He was seen at the cancer center by Dr. Janese Banks and instructed to come to the emergency room for rehydration and for evaluation of possible infection.  Patient states he has been able to tolerate p.o. water, but it causes increased nausea.  He has not been able to tolerate anything else p.o.  He denies internal abdominal pain, but reports significant pain around the G-tube where the skin is macerated due to G-tube leaking.  He denies fevers, chills, chest pain, shortness of breath, vomiting, urinary symptoms, or abnormal bowel movements.   HPI  Past Medical History:  Diagnosis Date  . Cancer (HCC)    esophageal  . Dysphagia   . GERD (gastroesophageal reflux disease)     Patient Active Problem List   Diagnosis Date Noted  . Cellulitis of abdominal wall   . Sepsis ([place]) 02/25/2017  . Jejunostomy site infection ([place]) 02/25/2017  . Dehydration 02/20/2017  . Jejunostomy tube site pain ([place]) 01/29/2017  . 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 Goals of care, counseling/discussion 01/04/2017  . Protein-calorie malnutrition, severe 12/07/2016  . 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 Dysphagia 12/06/2016  . GERD (gastroesophageal  reflux disease) 06/01/2016    Past Surgical History:  Procedure Laterality Date  . ABDOMINAL HYSTERECTOMY    . CHOLECYSTECTOMY    . ESOPHAGOGASTRODUODENOSCOPY (EGD) WITH PROPOFOL N/A 12/07/2016   Procedure: ESOPHAGOGASTRODUODENOSCOPY (EGD) WITH PROPOFOL;  Surgeon: Jonathon Bellows, MD;  Location: [facility] ENDOSCOPY;  Service: Gastroenterology;  Laterality: N/A;  . ESOPHAGOGASTRODUODENOSCOPY (EGD) WITH PROPOFOL N/A 12/26/2016   Procedure: ESOPHAGOGASTRODUODENOSCOPY (EGD) WITH PROPOFOL WITH DILATION;  Surgeon: Jonathon Bellows, MD;  Location[facility] ENDOSCOPY;  Service: Gastroenterology;  Laterality: N/A;  . EYE SURGERY    . GASTROJEJUNOSTOMY N/A 01/16/2017   Procedure: LAPROSCOPIC ASSIST FEEDING JEJUNOSTOMY TUBE;  Surgeon: Johnathan Hausen, MD;  Location: [facility] ORS;  Service: General;  Laterality: N/A;  . IR FLUORO GUIDE PORT INSERTION RIGHT  01/10/2017  . PICC LINE INSERTION Right        Home Medications    Prior to Admission medications   Medication Sig Start Date End Date Taking? Authorizing Provider  dexamethasone (DECADRON) 4 MG tablet Take 8 mg by mouth daily. Start the day after chemo for two days    [provider]  hydrocortisone cream-nystatin cream-zinc oxide Apply 1 application 4 (four) times daily topically. 01/29/17   Verlon Au, NP  lidocaine-prilocaine (EMLA) cream APPLY TO AFFECTED AREA ONCE 01/05/17   [provider]  loperamide (IMODIUM) 2 MG capsule Take 1 capsule (2 mg total) by mouth as needed for diarrhea or loose stools (take two tablests after the first loose stool, then 1 tablet after each loose stool). 02/20/17   Sindy Guadeloupe,  MD  morphine (ROXANOL) 20 MG/ML concentrated solution Take 0.25 mLs (5 mg total) by mouth every 4 (four) hours as needed for severe pain. 03/01/17   Sindy Guadeloupe, MD  Nutritional Supplements (FEEDING SUPPLEMENT, VITAL 1.5 CAL,) LIQD Give vital 1.5 via continuous pump at 33ml/hr for 22 hours.  Flush with 275ml of water before starting  tube feeding and after stopping tube feeding.  Provide additional 2107ml of water 2 other times during waking hours via J tube. 01/25/17   Sindy Guadeloupe, MD  nystatin (MYCOSTATIN/NYSTOP) powder Apply topically 2 (two) times daily. Apply to excoriated skin around J Tube. Cover with gauze 02/26/17   Sudini, Alveta Heimlich, MD  ondansetron (ZOFRAN-ODT) 4 MG disintegrating tablet TAKE 1 TABLET BY MOUTH EVERY 8 HOURS AS NEEDED FOR NAUSEA AND VOMITING 01/04/17   [provider]  prochlorperazine (COMPAZINE) 10 MG tablet TAKE 1 TABLET (10 MG TOTAL) BY MOUTH EVERY 6 (SIX) HOURS AS NEEDED (NAUSEA OR VOMITING). 01/11/17   [provider]    Family History Family History  Problem Relation Age of Onset  . Heart failure Father   . Prostate cancer Neg Hx   . Bladder Cancer Neg Hx   . Kidney cancer Neg Hx     Social History Social History   Tobacco Use  . Smoking status: Never Smoker  . Smokeless tobacco: Never Used  Substance Use Topics  . Alcohol use: No  . Drug use: No     Allergies   Patient has no known allergies.   Review of Systems Review of Systems  Gastrointestinal: Positive for nausea.  Skin: Positive for color change and rash.       Redness and pain around the j-tube  Allergic/Immunologic: Positive for immunocompromised state.  Neurological: Positive for weakness.  All other systems reviewed and are negative.    Physical Exam Updated Vital Signs BP (!) 149/82   Pulse 99   Temp 97.9 F (36.6 C) (Oral)   Resp 18   Ht 5\' 9"  (1.753 m)   Wt 81.6 kg (180 lb)   SpO2 99%   BMI 26.58 kg/m   Physical Exam  Constitutional: He is oriented to person, place, and time. He appears well-developed and well-nourished. No distress.  HENT:  Head: Normocephalic and atraumatic.  Mouth/Throat: Uvula is midline and oropharynx is clear and moist. Mucous membranes are dry.  Eyes: EOM are normal.  Neck: Normal range of motion.  Cardiovascular: Normal rate, regular rhythm and  intact distal pulses.  Pulmonary/Chest: Effort normal and breath sounds normal. No respiratory distress. He has no wheezes.  Abdominal: Soft. He exhibits no distension and no mass. There is tenderness. There is no rebound and no guarding.  Erythema surrounding J-tube with actively leaking stomach fluids around the J-tube.  No obvious infection. Pain of this area, no TTP elsewhere in the abd  Musculoskeletal: Normal range of motion.  Neurological: He is alert and oriented to person, place, and time.  Skin: Skin is warm and dry.  Psychiatric: He has a normal mood and affect.  Nursing note and vitals reviewed.    ED Treatments / Results  Labs (all labs ordered are listed, but only abnormal results are displayed) Labs Reviewed  CBC - Abnormal; Notable for the following components:      Result Value   WBC 3.6 (*)    RBC 3.67 (*)    Hemoglobin 11.2 (*)    HCT 32.2 (*)    RDW 16.7 (*)    Platelets  88 (*)    All other components within normal limits  COMPREHENSIVE METABOLIC PANEL - Abnormal; Notable for the following components:   Sodium 132 (*)    Glucose, Bld 105 (*)    BUN 24 (*)    Calcium 7.4 (*)    Total Protein 4.9 (*)    Albumin 2.6 (*)    Total Bilirubin 1.4 (*)    All other components within normal limits  URINALYSIS, ROUTINE W REFLEX MICROSCOPIC    EKG  EKG Interpretation None       Radiology No results found.  Procedures Procedures (including critical care time)  Medications Ordered in ED Medications  sodium chloride 0.9 % bolus 1,000 mL (not administered)  sodium chloride 0.9 % bolus 1,000 mL (0 mLs Intravenous Stopped 03/03/17 1528)  ondansetron (ZOFRAN) injection 4 mg (4 mg Intravenous Given 03/03/17 1432)     Initial Impression / Assessment and Plan / ED Course  I have reviewed the triage vital signs and the nursing notes.  Pertinent labs & imaging results that were available during my care of the patient were reviewed by me and considered in my  medical decision making (see chart for details).     Patient presenting upon request of his cancer doctor for dehydration and concern about G-tube leaking.  Physical exam shows patient is tachycardic and clinically appears dry.  J-tube is leaking stomach fluids and surrounding skin is erythematous and moist.  No obvious sign of infection at this time.  Patient is not currently on any antibiotics.  Will obtain basic labs and start fluids.   Labs showed dehydration, creatinine stable.  White count lower than 2 weeks ago at 3.6, platelets low at 88. I do not see signs of infection at this time. Hgb stable. HR improved with IVF. Pt reports improved weakness. Will contact hospitalist for admission due to dehydration.   Discussed with hospitalist, patient to be admitted.  Final Clinical Impressions(s) / ED Diagnoses   Final diagnoses:  Dehydration  Jejunostomy tube leak [facility])    ED Discharge Orders    None       Franchot Heidelberg, PA-C 03/03/17 [place], [place], DO 03/04/17 [phone]

## 2017-03-04 ENCOUNTER — Other Ambulatory Visit: Payer: Self-pay | Admitting: *Deleted

## 2017-03-04 DIAGNOSIS — C155 Malignant neoplasm of lower third of esophagus: Secondary | ICD-10-CM | POA: Diagnosis not present

## 2017-03-04 DIAGNOSIS — Z934 Other artificial openings of gastrointestinal tract status: Secondary | ICD-10-CM | POA: Diagnosis not present

## 2017-03-04 DIAGNOSIS — K9413 Enterostomy malfunction: Secondary | ICD-10-CM | POA: Diagnosis not present

## 2017-03-04 DIAGNOSIS — E860 Dehydration: Secondary | ICD-10-CM | POA: Diagnosis not present

## 2017-03-04 LAB — CBC
HEMATOCRIT: 28.4 % — AB (ref 39.0–52.0)
HEMOGLOBIN: 9.7 g/dL — AB (ref 13.0–17.0)
MCH: 30 pg (ref 26.0–34.0)
MCHC: 34.2 g/dL (ref 30.0–36.0)
MCV: 87.9 fL (ref 78.0–100.0)
Platelets: 75 10*3/uL — ABNORMAL LOW (ref 150–400)
RBC: 3.23 MIL/uL — ABNORMAL LOW (ref 4.22–5.81)
RDW: 16.5 % — ABNORMAL HIGH (ref 11.5–15.5)
WBC: 3.1 10*3/uL — AB (ref 4.0–10.5)

## 2017-03-04 LAB — MAGNESIUM: Magnesium: 1.7 mg/dL (ref 1.7–2.4)

## 2017-03-04 LAB — COMPREHENSIVE METABOLIC PANEL
ALT: 46 U/L (ref 17–63)
ANION GAP: 5 (ref 5–15)
AST: 20 U/L (ref 15–41)
Albumin: 2.3 g/dL — ABNORMAL LOW (ref 3.5–5.0)
Alkaline Phosphatase: 78 U/L (ref 38–126)
BILIRUBIN TOTAL: 1.3 mg/dL — AB (ref 0.3–1.2)
BUN: 19 mg/dL (ref 6–20)
CO2: 23 mmol/L (ref 22–32)
Calcium: 7.3 mg/dL — ABNORMAL LOW (ref 8.9–10.3)
Chloride: 105 mmol/L (ref 101–111)
Creatinine, Ser: 0.62 mg/dL (ref 0.61–1.24)
Glucose, Bld: 82 mg/dL (ref 65–99)
POTASSIUM: 4 mmol/L (ref 3.5–5.1)
Sodium: 133 mmol/L — ABNORMAL LOW (ref 135–145)
TOTAL PROTEIN: 4.5 g/dL — AB (ref 6.5–8.1)

## 2017-03-04 LAB — GLUCOSE, CAPILLARY
GLUCOSE-CAPILLARY: 68 mg/dL (ref 65–99)
Glucose-Capillary: 69 mg/dL (ref 65–99)

## 2017-03-04 MED ORDER — JEVITY 1.2 CAL PO LIQD: 1000.0000 mL | ORAL | Status: DC

## 2017-03-04 MED ORDER — VITAL 1.5 CAL PO LIQD: 1000.0000 mL | ORAL | Status: DC

## 2017-03-04 NOTE — Consult Note (Addendum)
[facility] Nurse wound consult note Reason for Consult: Consult requested for skin surrounding J-tube.  This has been leaking for several weeks and skin is red and macerated; affected area is approx 4 inches surrounding the tube.  There are patchy areas of full thickness skin loss, red moist wound beds, and mod amt weeping yellow drainage.  No odor or fluctuance, very painful to touch. Surgery has consulted and plans for patient to have tube replaced in IR, according to the EMR.  He is leaking mod amt green drainage around the insertion site and we will be unable to promote healing until drainage problem is resolved.  Dressing procedure/placement/frequency: Attempted to protect skin with skin prep and ostomy powder to repel moisture, and vaseline gauze to keep dressing from sticking against the skin.  Left orders for staff nurses to reapply skin prep and powder and change dressings Q 4 hours to decrease to chance of it sticking to the skin or exposed to prolonged moisture from a wet dressing.  Discussed plan of care with patient and he verbalized understanding, but he is frustrated since he does not know when the tube replacement is planned. Please re-consult if further assistance is needed.  Thank-you,  Julien Girt MSN, [place], [place], [place], [place]

## 2017-03-04 NOTE — Progress Notes (Signed)
Patient ID: Casey Acevedo, male   DOB: 22-Apr-1940, 76 y.o.   MRN: [id number] j tube coming out, discussed with ir. Plan for placement tomorrow.  Hesitant to just place blindly at this point and should be fine for placement tomorrow

## 2017-03-04 NOTE — Progress Notes (Signed)
RN walked into room to place dressing over tube site. However, tube was noted to be completely out.   Rn placed a dry dressing and secured it into place.  Paged on call surgeon to notify tube was completely out.

## 2017-03-04 NOTE — Progress Notes (Signed)
Call received from surgeon on call. MD aware and instructed RN to cover site with dressing and IR will see pt in AM. Will continue to monitor

## 2017-03-04 NOTE — Evaluation (Signed)
Physical Therapy Evaluation Patient Details Name: Casey Acevedo MRN: [id number] DOB: Apr 19, 1940 Today's Date: 03/04/2017   History of Present Illness  76 year old male with history of esophageal cancer currently undergoing chemotherapy and radiation, jejunostomy tube placement with intermittent leakage and recent admission to [facility] for concerns of jejunostomy site cellulitis which was treated with antibiotics and discharged on oral antibiotics. He had presented on 03/03/2017 with weakness and persistent J-tube leakage  Clinical Impression  Patient evaluated by Physical Therapy with no further acute PT needs identified. All education has been completed and the patient has no further questions. See below for any follow-up Physical Therapy or equipment needs. PT is signing off. Thank you for this referral.   Follow Up Recommendations No PT follow up    Equipment Recommendations  None recommended by PT    Recommendations for Other Services       Precautions / Restrictions Precautions Precaution Comments: j tube (leaking)      Mobility  Bed Mobility Overal bed mobility: Independent                Transfers Overall transfer level: Independent                  Ambulation/Gait Ambulation/Gait assistance: Independent Ambulation Distance (Feet): 380 Feet Assistive device: None Gait Pattern/deviations: WFL(Within Functional Limits)        Stairs            Wheelchair Mobility    Modified Rankin (Stroke Patients Only)       Balance Overall balance assessment: No apparent balance deficits (not formally assessed) Sitting-balance support: Feet supported;No upper extremity supported Sitting balance-Leahy Scale: Normal       Standing balance-Leahy Scale: Normal               High level balance activites: Direction changes;Turns;Sudden stops;Head turns;Backward walking High Level Balance Comments: no LOB with above,  denies hx of falls             Pertinent Vitals/Pain Pain Assessment: Faces Faces Pain Scale: Hurts little more Pain Location: abd with activity Pain Descriptors / Indicators: Burning;Sore Pain Intervention(s): Monitored during session    Home Living Family/patient expects to be discharged to:: Private residence Living Arrangements: Spouse/significant other   Type of Home: House         Home Equipment: None      Prior Function Level of Independence: Independent         Comments: pt reports being very independent and active at baseline     Hand Dominance        Extremity/Trunk Assessment   Upper Extremity Assessment Upper Extremity Assessment: Overall WFL for tasks assessed    Lower Extremity Assessment Lower Extremity Assessment: Overall WFL for tasks assessed    Cervical / Trunk Assessment Cervical / Trunk Assessment: Normal  Communication   Communication: No difficulties  Cognition Arousal/Alertness: Awake/alert Behavior During Therapy: WFL for tasks assessed/performed Overall Cognitive Status: Within Functional Limits for tasks assessed                                 General Comments: pt expresses being very annoyed and aggravated not knowing what is going on with his care and plan; nursing staff aware (does not know when his J tube is going to be changed)      General Comments      Exercises  Assessment/Plan    PT Assessment Patent does not need any further PT services  PT Problem List         PT Treatment Interventions      PT Goals (Current goals can be found in the Care Plan section)  Acute Rehab PT Goals Patient Stated Goal: to get some answers regarding his care PT Goal Formulation: All assessment and education complete, DC therapy    Frequency     Barriers to discharge        Co-evaluation               AM-PAC PT "6 Clicks" Daily Activity  Outcome Measure Difficulty turning over in bed  (including adjusting bedclothes, sheets and blankets)?: None Difficulty moving from lying on back to sitting on the side of the bed? : None Difficulty sitting down on and standing up from a chair with arms (e.g., wheelchair, bedside commode, etc,.)?: None Help needed moving to and from a bed to chair (including a wheelchair)?: None Help needed walking in hospital room?: None Help needed climbing 3-5 steps with a railing? : None 6 Click Score: 24    End of Session   Activity Tolerance: Patient tolerated treatment well Patient left: in bed;with call bell/phone within reach   PT Visit Diagnosis: Pain Pain - Right/Left: Left Pain - part of body: (abdomen)    Time: 5462-7035 PT Time Calculation (min) (ACUTE ONLY): 20 min   Charges:   PT Evaluation $PT Eval Low Complexity: 1 Low     PT G Codes:   PT G-Codes **NOT FOR INPATIENT CLASS** Functional Assessment Tool Used: AM-PAC 6 Clicks Basic Mobility;Clinical judgement Functional Limitation: Mobility: Walking and moving around Mobility: Walking and Moving Around Current Status (K0938): 0 percent impaired, limited or restricted Mobility: Walking and Moving Around Goal Status (H8299): 0 percent impaired, limited or restricted Mobility: Walking and Moving Around Discharge Status (B7169): 0 percent impaired, limited or restricted    Kenyon Ana, PT Pager: [phone] 03/04/2017   Kenyon Ana 03/04/2017, 2:03 PM

## 2017-03-04 NOTE — Progress Notes (Addendum)
Initial Nutrition Assessment  INTERVENTION:   Once tube is replaced: Initiate Vital 1.5 @ 40 ml/hr for 4 hours and then advance to goal rate of 80 ml/hr if tolerating. TF will infuse over 18 hours.  Provide additional 30 ml Prostat daily. Recommend 240 ml water flush QID.  This will provide 2260 kcal, 112g protein and 2060 ml H2O.  Goal rate: Vital 1.5 @ 80 ml/hr x 18 hours (1800-1200).  NUTRITION DIAGNOSIS:   Increased nutrient needs related to cancer and cancer related treatments as evidenced by estimated needs.  GOAL:   Patient will meet greater than or equal to 90% of their needs  MONITOR:   Labs, Weight trends, TF tolerance, I & O's  REASON FOR ASSESSMENT:   Consult Enteral/tube feeding initiation and management  ASSESSMENT:   76 year old male with history of esophageal cancer currently undergoing chemotherapy and radiation, jejunostomy tube placement with intermittent leakage and recent admission to [facility] for concerns of jejunostomy site cellulitis which was treated with antibiotics and discharged on oral antibiotics. He had presented on 03/03/2017 with weakness and persistent J-tube leakage. He was started on intravenous fluids and general surgery was consulted.  Patient in room with IV beeping. Pt agitated by beeping. Seemed preoccupied by not knowing when his J-tube would be replaced. Per chart review, pt was scheduled to have J-tube replaced by IR tomorrow 12/24. Pt states a lot of people have talked to him today and he has been given mixed messages as to when his tube would be replaced.  Pt states he was tolerating his tube feeding other than the tube leaking. Pt was infusing Vital 1.5 @ 80 ml/hr over 18 hours (a total of 6 cartons daily).  Pt's weight has remained stable.  Medications reviewed.  Labs reviewed: Low Na Mg WNL   NUTRITION - FOCUSED PHYSICAL EXAM:    Most Recent Value  Orbital Region  No depletion  Upper Arm Region  No  depletion  Thoracic and Lumbar Region  Unable to assess  Buccal Region  No depletion  Temple Region  Mild depletion  Clavicle and Acromion Bone Region  No depletion  Scapular Bone Region  No depletion  Dorsal Hand  Mild depletion  Patellar Region  Unable to assess  Anterior Thigh Region  Unable to assess  Posterior Calf Region  Unable to assess  Edema (RD Assessment)  None       Diet Order:  Diet clear liquid Room service appropriate? Yes; Fluid consistency: Thin  EDUCATION NEEDS:   Education needs have been addressed  Skin:  Skin Assessment: Reviewed RN Assessment  Last BM:  12/21  Height:   Ht Readings from Last 1 Encounters:  03/03/17 5\' 9"  (1.753 m)    Weight:   Wt Readings from Last 1 Encounters:  03/03/17 183 lb 3.2 oz (83.1 kg)    Ideal Body Weight:  72.7 kg  BMI:  Body mass index is 27.05 kg/m.  Estimated Nutritional Needs:   Kcal:  2200-2400  Protein:  115-125g  Fluid:  2.4L/day   Clayton Bibles, MS, RD, LDN [place] Dietitian Pager: [phone] After Hours Pager: [phone]

## 2017-03-04 NOTE — Progress Notes (Signed)
RN called to room by patient. Per patient he stood up to use urinal and J tube came out. Stitched area of tube no longer attached to patient. Hospitalists notified. Instructed to notify surgery. Awaiting callback

## 2017-03-04 NOTE — Progress Notes (Signed)
Patient ID: Casey Acevedo, male   DOB: September 07, 1940, 76 y.o.   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 He had presented on 03/03/2017 with weakness and persistent J-tube leakage. He was started on intravenous fluids and general surgery was consulted.  Assessment & Plan:   Active Problems:   Dehydration   Malignant neoplasm of lower third of esophagus (HCC)   S/P jejunostomy (HCC)    Dehydration -Probably secondary to jejunostomy tube leak -Continue IV fluids  Persistent jejunostomy tube leak -Recent CAT scan of the abdomen on 02/25/2017 did not show any acute abnormalities -Patient was recently treated with antibiotics for skin cellulitis.  Currently does not need any antibiotics -General surgery input appreciated. General surgery has deferred the management to IR. IR consult was already placed on admission. IR evaluation pending.  Patient was scheduled to have outpatient IR procedure for the J-tube on 03/05/2017 - Wound care consulted  Esophageal cancer currently undergoing chemotherapy and radiation -Patient was sent to the ED by oncology.   spoke to oncologist/Dr. Janese Banks on phone on 03/03/2017 and discussed the management plan. She had suggested surgical evaluation regarding J-tube drainage. Patient will need outpatient oncology follow-up and follow-up with  Dr. Servando Snare as an outpatient.  Pancytopenia -Probably secondary to cancer and chemotherapy -Monitor - Repeat a.m. labs  Generalized deconditioning -PT eval  DVT prophylaxis: Lovenox Code Status:   Full Family Communication: None at bedside Disposition Plan: Depends on clinical outcome  Consultants: Gen. Surgery Spoke to oncologist/Dr. Janese Banks on phone on 03/03/2017  Procedures: None  Antimicrobials: None    Subjective: Patient seen and examined at bedside. He does not feel any significant change since admission. he still feels very weak. No overnight fever or vomiting.  Objective: Vitals:   03/03/17 1700 03/03/17 1829 03/03/17 2023 03/04/17 0444  BP: (!) 150/82 (!) 156/90 136/81 136/72  Pulse: 95 96 94 90  Resp:   16 14  Temp:  98.5 F (36.9 C) 98.4 F (36.9 C) 97.7 F (36.5 C)  TempSrc:  Oral Oral Oral  SpO2:  96% 96% 97%  Weight:  83.1 kg (183 lb 3.2 oz)    Height:  5\' 9"  (1.753 m)      Intake/Output Summary (Last 24 hours) at 03/04/2017 1026 Last data filed at 03/04/2017 0900 Gross per 24 hour  Intake 3076.67 ml  Output 2250 ml  Net 826.67 ml   Filed Weights   03/03/17 1317 03/03/17 1829  Weight: 81.6 kg (180 lb) 83.1 kg (183 lb 3.2 oz)    Examination:  General exam: Appears calm and comfortable  Respiratory system: Bilateral decreased breath sound at bases Cardiovascular system: S1 & S2 heard, rate controlled  Gastrointestinal system: Abdomen is nondistended, soft and nontender. Normal bowel sounds heard. Midline dressing with J-tube present with leakage; mild surrounding skin erythema Extremities: No cyanosis, clubbing, edema   Data Reviewed: I have personally reviewed following labs and imaging studies  CBC: Recent Labs  Lab 02/25/17 1630 02/26/17 0501 03/01/17 0840 03/03/17 1425 03/04/17 0338  WBC 1.9* 1.8* 10.4 3.6* 3.1*  NEUTROABS 1.6  --  9.4*  --   --  HGB 12.7* 11.1* 11.7* 11.2* 9.7*  HCT 36.6* 32.5* 34.8* 32.2* 28.4*  MCV 87.0 87.6 88.8 87.7 87.9  PLT 201 191 170 88* 75*   Basic Metabolic Panel: Recent Labs  Lab 02/25/17 1630 02/26/17 0501 03/01/17 0840 03/03/17 1425 03/04/17 0338  NA 136 135 135 132* 133*  K 4.4 4.2 3.8  3.9 4.0  CL 103 106 105 102 105  CO2 24 24 23 23 23   GLUCOSE 146* 111* 112* 105* 82  BUN 29* 24* 27* 24* 19  CREATININE 0.66 0.68 0.74 0.76 0.62  CALCIUM 8.5* 7.8* 7.8* 7.4* 7.3*  MG  --   --   --   --  1.7   GFR: Estimated Creatinine Clearance: 78.6 mL/min (by C-G formula based on SCr of 0.62 mg/dL). Liver Function Tests: Recent Labs  Lab 02/25/17 1630 02/26/17 0501 03/01/17 0840 03/03/17 1425 03/04/17 0338  AST 24 17 27 26 20   ALT 42 35 46 51 46  ALKPHOS 98 80 110 91 78  BILITOT 1.4* 1.3* 0.9 1.4* 1.3*  PROT 5.9* 4.7* 5.4* 4.9* 4.5*  ALBUMIN 2.9* 2.5* 2.8* 2.6* 2.3*   No results for input(s): LIPASE, AMYLASE in the last 168 hours. No results for input(s): AMMONIA in the last 168 hours. Coagulation Profile: No results for input(s): INR, PROTIME in the last 168 hours. Cardiac Enzymes: No results for input(s): CKTOTAL, CKMB, CKMBINDEX, TROPONINI in the last 168 hours. BNP (last 3 results) No results for input(s): PROBNP in the last 8760 hours. HbA1C: No results for input(s): HGBA1C in the last 72 hours. CBG: No results for input(s): GLUCAP in the last 168 hours. Lipid Profile: No results for input(s): CHOL, HDL, LDLCALC, TRIG, CHOLHDL, LDLDIRECT in the last 72 hours. Thyroid Function Tests: No results for input(s): TSH, T4TOTAL, FREET4, T3FREE, THYROIDAB in the last 72 hours. Anemia Panel: No results for input(s): VITAMINB12, FOLATE, FERRITIN, TIBC, IRON, RETICCTPCT in the last 72 hours. Sepsis Labs: Recent Labs  Lab 02/25/17 1630  LATICACIDVEN 2.0*    Recent Results (from the past 240 hour(s))  Blood Culture (routine x 2)     Status: None   Collection Time: 02/25/17  9:40 PM  Result Value Ref Range Status   Specimen Description BLOOD LEFT HAND  Final   Special Requests   Final    BOTTLES DRAWN AEROBIC AND ANAEROBIC Blood Culture adequate volume   Culture NO GROWTH 5 DAYS  Final   Report Status 03/02/2017 FINAL  Final  Blood Culture (routine x 2)     Status:  None   Collection Time: 02/25/17  9:40 PM  Result Value Ref Range Status   Specimen Description BLOOD LEFT WRIST  Final   Special Requests   Final    BOTTLES DRAWN AEROBIC AND ANAEROBIC Blood Culture results may not be optimal due to an excessive volume of blood received in culture bottles   Culture NO GROWTH 5 DAYS  Final   Report Status 03/02/2017 FINAL  Final  Aerobic/Anaerobic Culture (surgical/deep wound)     Status: Abnormal   Collection Time: 02/25/17 10:20 PM  Result Value Ref Range Status   Specimen Description   Final    PEG SITE Performed at [facility], [address], [place], [place] [address]    Special Requests   Final    Immunocompromised Performed at [facility], [address] [place], [place] [address]    Gram Stain   Final    NO WBC SEEN MODERATE GRAM NEGATIVE RODS  FEW GRAM POSITIVE RODS FEW GRAM POSITIVE COCCI IN PAIRS    Culture (A)  Final    MULTIPLE ORGANISMS PRESENT, NONE PREDOMINANT NO ANAEROBES ISOLATED Performed at [facility], [place] [address], [place], [place] [address]    Report Status 03/03/2017 FINAL  Final         Radiology Studies: No results found.      Scheduled Meds: . enoxaparin (LOVENOX) injection  40 mg Subcutaneous Q24H  . nystatin   Topical BID   Continuous Infusions: . sodium chloride 100 mL/hr at 03/04/17 0430     LOS: 0 days        Aline August, MD [facility] Pager [phone]  If 7PM-7AM, please contact night-coverage [email or web address] Password [facility] 03/04/2017, 10:26 AM

## 2017-03-05 ENCOUNTER — Other Ambulatory Visit: Payer: Self-pay

## 2017-03-05 ENCOUNTER — Ambulatory Visit: Payer: Medicare Other

## 2017-03-05 ENCOUNTER — Inpatient Hospital Stay (HOSPITAL_COMMUNITY): Admission: RE | Admit: 2017-03-05 | Payer: Medicare Other | Source: Ambulatory Visit

## 2017-03-05 ENCOUNTER — Observation Stay (HOSPITAL_COMMUNITY): Payer: Medicare Other

## 2017-03-05 ENCOUNTER — Encounter (HOSPITAL_COMMUNITY): Payer: Self-pay | Admitting: Interventional Radiology

## 2017-03-05 ENCOUNTER — Inpatient Hospital Stay: Payer: Medicare Other

## 2017-03-05 DIAGNOSIS — E860 Dehydration: Secondary | ICD-10-CM | POA: Diagnosis not present

## 2017-03-05 DIAGNOSIS — K9413 Enterostomy malfunction: Secondary | ICD-10-CM | POA: Diagnosis not present

## 2017-03-05 DIAGNOSIS — C155 Malignant neoplasm of lower third of esophagus: Secondary | ICD-10-CM | POA: Diagnosis not present

## 2017-03-05 HISTORY — PX: IR REPLC DUODEN/JEJUNO TUBE PERCUT W/FLUORO: IMG2334

## 2017-03-05 LAB — GLUCOSE, CAPILLARY
GLUCOSE-CAPILLARY: 113 mg/dL — AB (ref 65–99)
Glucose-Capillary: 67 mg/dL (ref 65–99)
Glucose-Capillary: 59 mg/dL — ABNORMAL LOW (ref 65–99)
Glucose-Capillary: 90 mg/dL (ref 65–99)

## 2017-03-05 LAB — BASIC METABOLIC PANEL
Anion gap: 7 (ref 5–15)
BUN: 12 mg/dL (ref 6–20)
CHLORIDE: 104 mmol/L (ref 101–111)
CO2: 21 mmol/L — AB (ref 22–32)
CREATININE: 0.6 mg/dL — AB (ref 0.61–1.24)
Calcium: 7.6 mg/dL — ABNORMAL LOW (ref 8.9–10.3)
GFR calc Af Amer: >60 mL/min (ref >60)
GFR calc non Af Amer: >60 mL/min (ref >60)
GLUCOSE: 71 mg/dL (ref 65–99)
POTASSIUM: 4 mmol/L (ref 3.5–5.1)
SODIUM: 132 mmol/L — AB (ref 135–145)

## 2017-03-05 LAB — CBC WITH DIFFERENTIAL/PLATELET
BASOS PCT: 4 %
Basophils Absolute: 0.1 10*3/uL (ref 0.0–0.1)
EOS ABS: 0 10*3/uL (ref 0.0–0.7)
EOS PCT: 1 %
HEMATOCRIT: 29.9 % — AB (ref 39.0–52.0)
HEMOGLOBIN: 10.1 g/dL — AB (ref 13.0–17.0)
LYMPHS PCT: 27 %
Lymphs Abs: 0.4 10*3/uL — ABNORMAL LOW (ref 0.7–4.0)
MCH: 29.4 pg (ref 26.0–34.0)
MCHC: 33.8 g/dL (ref 30.0–36.0)
MCV: 86.9 fL (ref 78.0–100.0)
MONOS PCT: 8 %
Monocytes Absolute: 0.1 10*3/uL (ref 0.1–1.0)
NEUTROS ABS: 1 10*3/uL — AB (ref 1.7–7.7)
NEUTROS PCT: 60 %
Platelets: 70 10*3/uL — ABNORMAL LOW (ref 150–400)
RBC: 3.44 MIL/uL — ABNORMAL LOW (ref 4.22–5.81)
RDW: 15.9 % — ABNORMAL HIGH (ref 11.5–15.5)
WBC: 1.6 10*3/uL — ABNORMAL LOW (ref 4.0–10.5)

## 2017-03-05 LAB — MAGNESIUM: MAGNESIUM: 1.6 mg/dL — AB (ref 1.7–2.4)

## 2017-03-05 IMAGING — XA IR REPLC DUODEN/JEJUNO TUBE PERCUT W/FLUORO
1 series · 7 of 7 positions shown · non-contrast
Comparison: none

INDICATION: Leaking jejunostomy, accidentally removed

[Series 300: line placements · 7 of 7 slices shown]
[im 1/7]
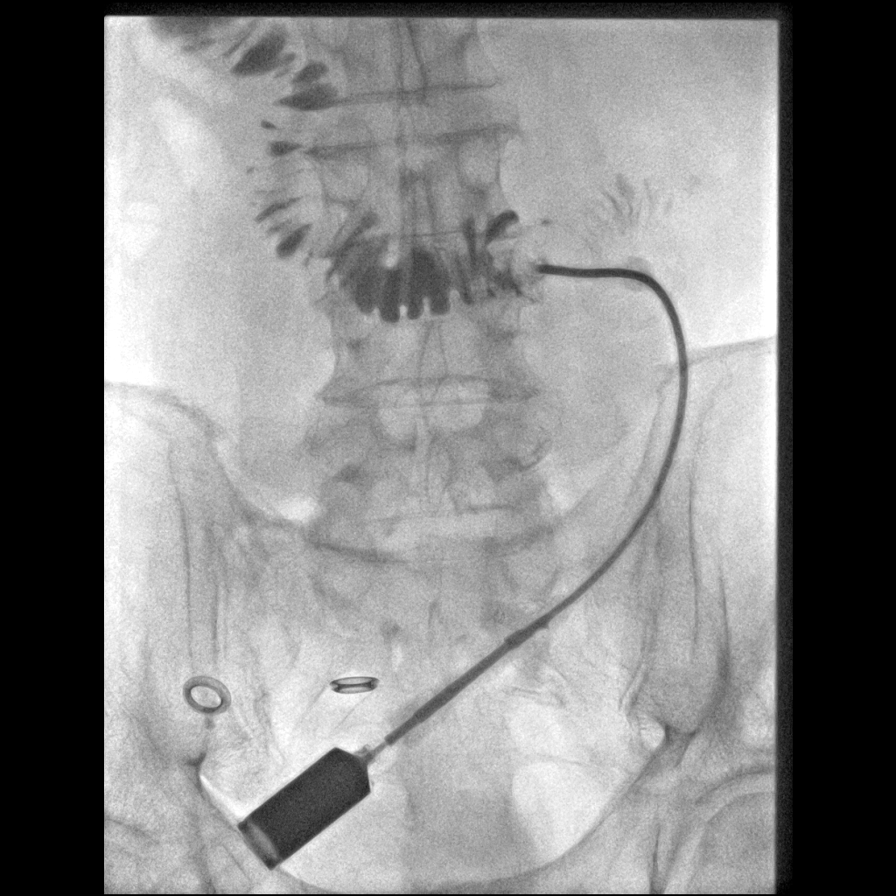
[im 2/7]
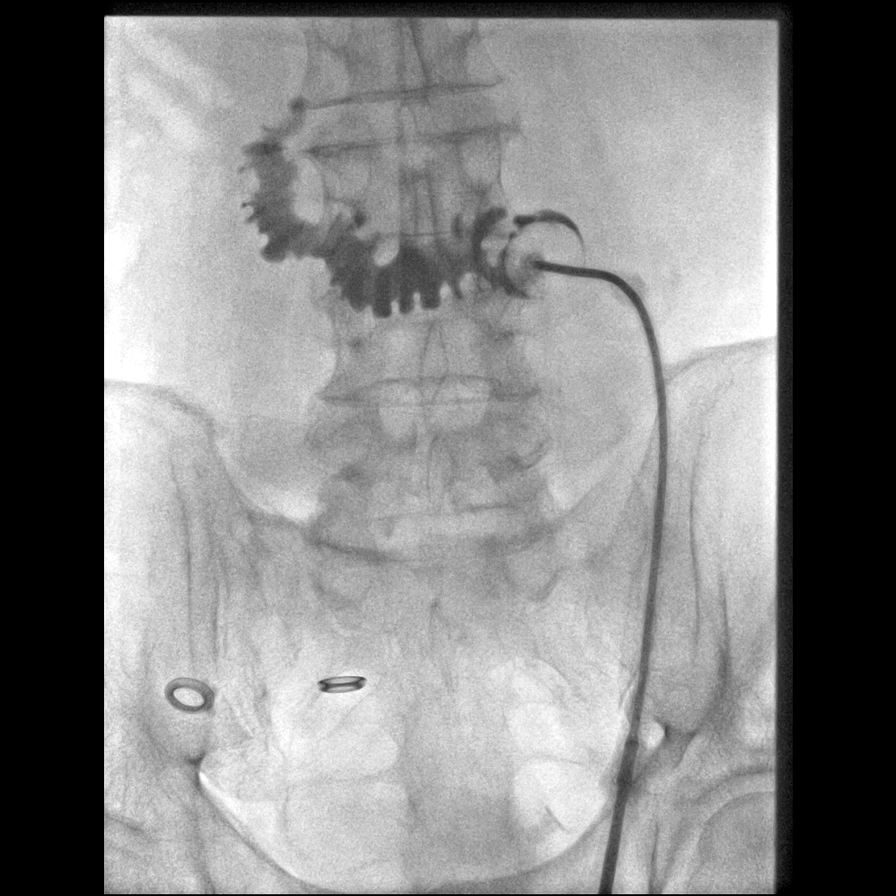
[im 3/7]
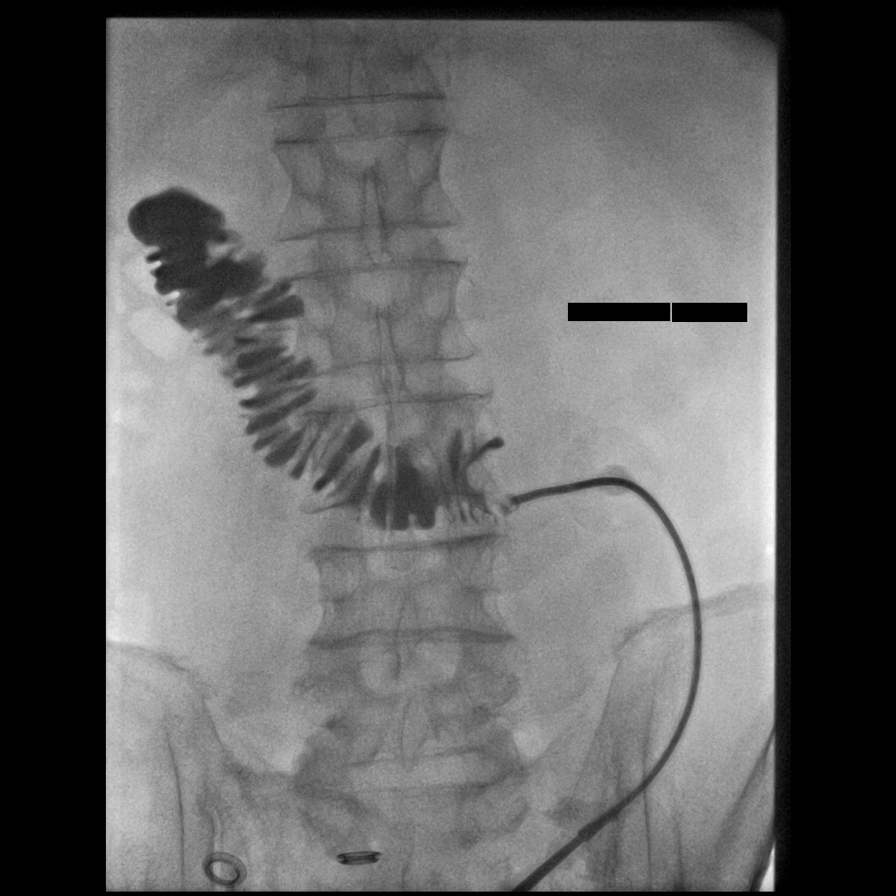
[im 4/7]
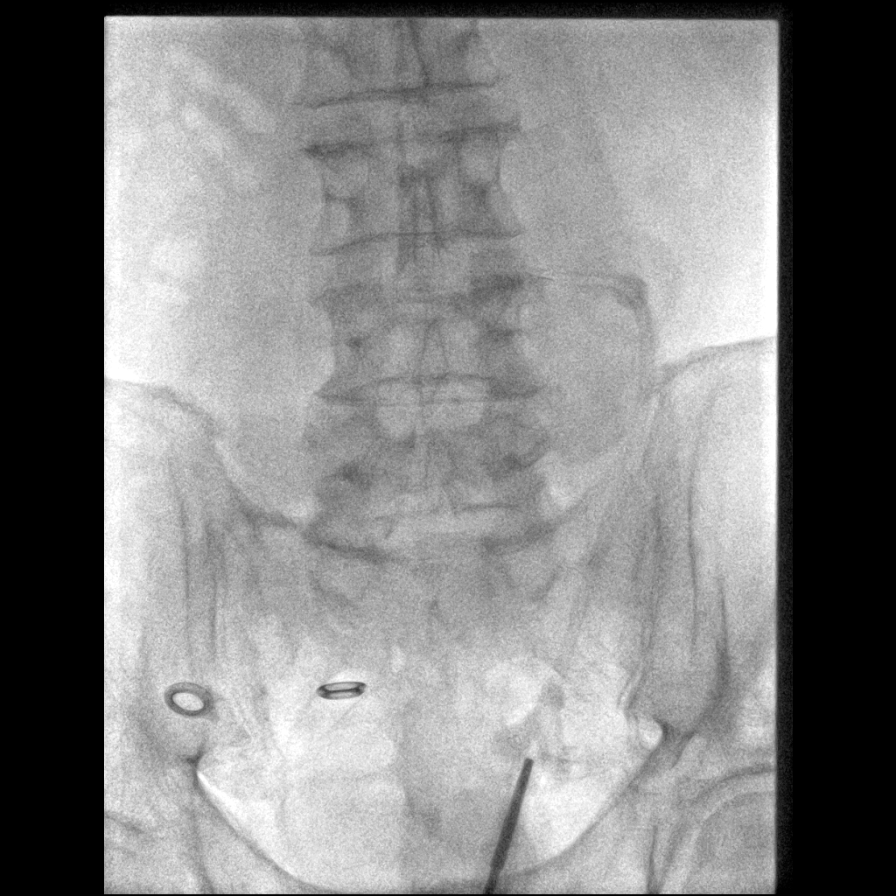
[im 5/7]
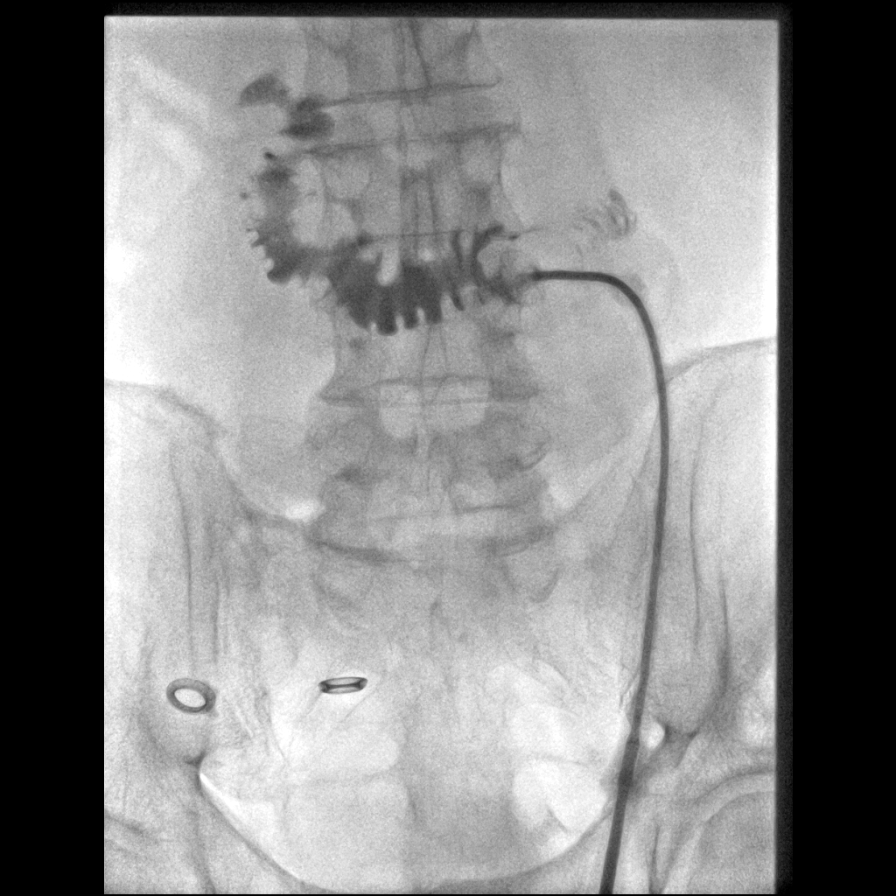
[im 6/7]
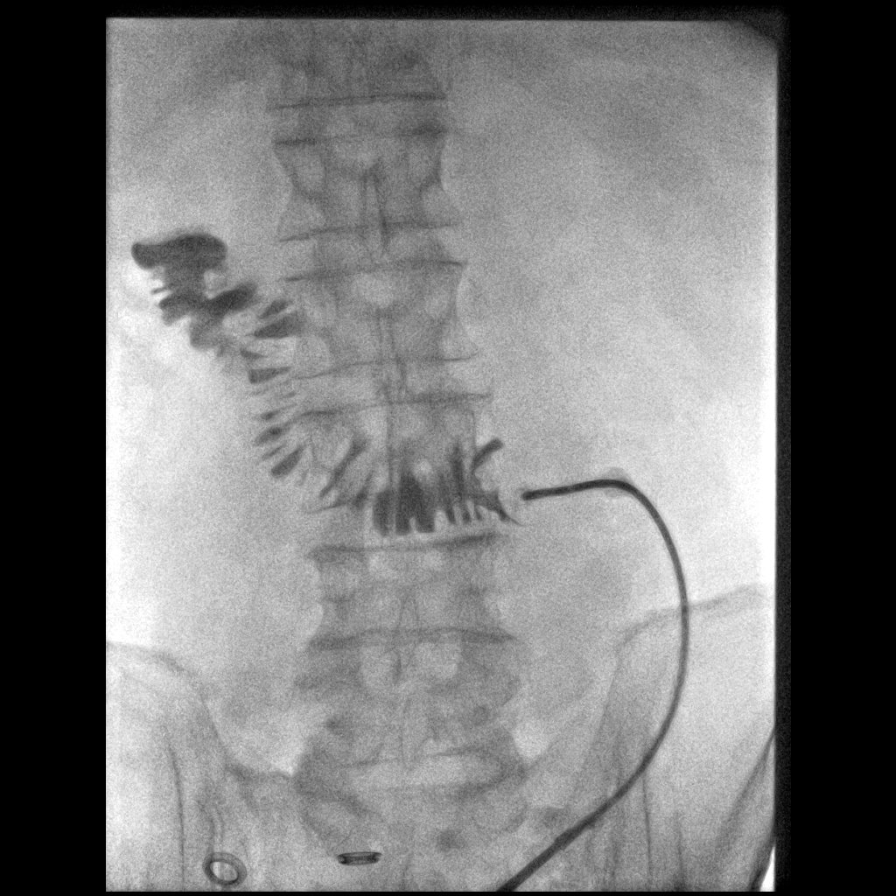
[im 7/7]
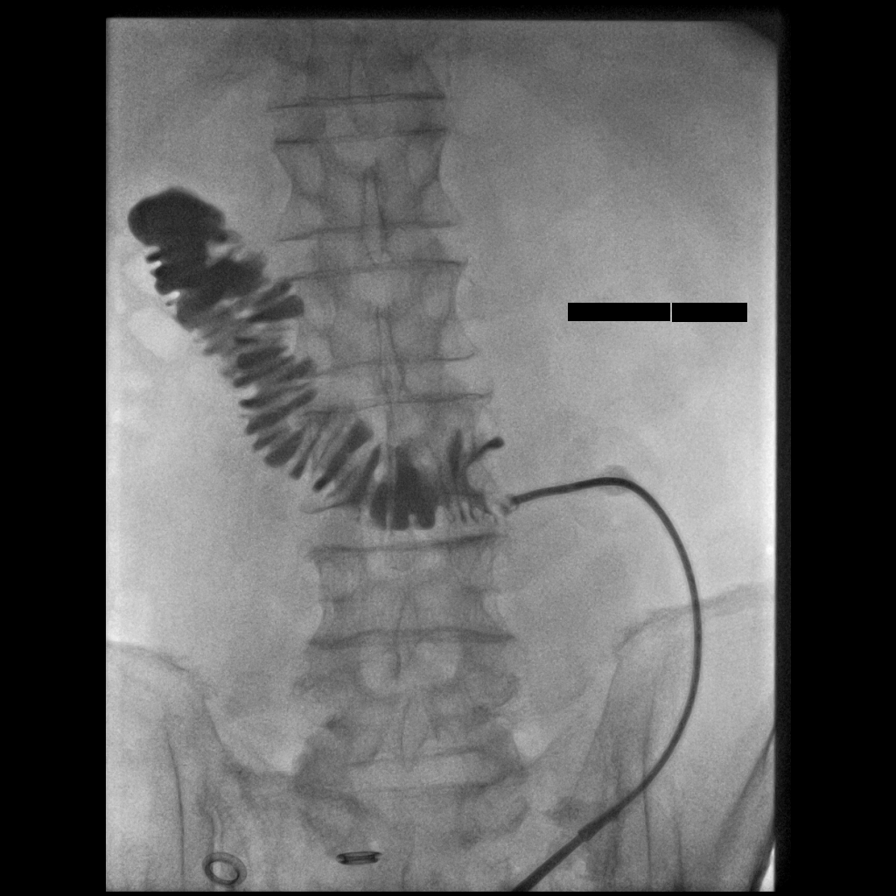

[7 of 7 positions shown; findings below may reference images not displayed]

EXAM:
FLUOROSCOPIC REPLACEMENT OF THE 16 FRENCH JEJUNOSTOMY

MEDICATIONS:
NONE.

ANESTHESIA/SEDATION:
NONE.

CONTRAST:  10 CC - administered into the gastric lumen.

FLUOROSCOPY TIME:  Fluoroscopy Time:  18 seconds (3 mGy).

COMPLICATIONS:
None immediate.

PROCEDURE:
Informed written consent was obtained from the patient after a
thorough discussion of the procedural risks, benefits and
alternatives. All questions were addressed. Maximal Sterile Barrier
Technique was utilized including caps, mask, sterile gowns, sterile
gloves, sterile drape, hand hygiene and skin antiseptic. A timeout
was performed prior to the initiation of the procedure.

the existing left abdominal jejunostomy percutaneous tract was
cannulated with a 16 french balloon retention catheter. balloon was
inflated with 5 cc saline. contrast injection confirms position in
the small bowel. catheter secured externally. no significant leakage
demonstrated. feeding tube ready for use.
IMPRESSION: FLUOROSCOPIC REPLACEMENT OF THE 16 FRENCH JEJUNOSTOMY.

## 2017-03-05 MED ORDER — LOPERAMIDE HCL 2 MG PO CAPS: 2.0000 mg | ORAL_CAPSULE | ORAL | Status: DC | PRN

## 2017-03-05 MED ORDER — DEXTROSE 50 % IV SOLN
INTRAVENOUS | Status: AC
  Filled 2017-03-05: qty 50

## 2017-03-05 MED ORDER — IOPAMIDOL (ISOVUE-300) INJECTION 61%
INTRAVENOUS | Status: AC
  Administered 2017-03-05: 20 mL
  Filled 2017-03-05: qty 50

## 2017-03-05 MED ORDER — VITAL 1.5 CAL PO LIQD
1000.0000 mL | ORAL | Status: DC
  Filled 2017-03-05: qty 1000

## 2017-03-05 MED ORDER — MAGNESIUM SULFATE 2 GM/50ML IV SOLN
2.0000 g | Freq: Once | INTRAVENOUS | Status: AC
  Administered 2017-03-05: 2 g via INTRAVENOUS
  Filled 2017-03-05: qty 50

## 2017-03-05 MED ORDER — DEXTROSE 50 % IV SOLN: 25.0000 mL | Freq: Once | INTRAVENOUS | Status: DC

## 2017-03-05 MED ORDER — PRO-STAT SUGAR FREE PO LIQD: 30.0000 mL | Freq: Every day | ORAL | Status: DC

## 2017-03-05 MED ORDER — PRO-STAT SUGAR FREE PO LIQD: 30.0000 mL | Freq: Every day | ORAL | 0 refills | Status: DC

## 2017-03-05 MED ORDER — DEXTROSE-NACL 5-0.9 % IV SOLN
INTRAVENOUS | Status: DC
  Administered 2017-03-05: 08:00:00 via INTRAVENOUS

## 2017-03-05 MED ORDER — IOPAMIDOL (ISOVUE-300) INJECTION 61%
50.0000 mL | Freq: Once | INTRAVENOUS | Status: AC | PRN
  Administered 2017-03-05: 20 mL

## 2017-03-05 NOTE — Progress Notes (Signed)
IR to replace J tube  Can use per their recommendations  Can discharge once complete

## 2017-03-05 NOTE — Progress Notes (Addendum)
Patient has contant leaking from J tube site. Skin is excoriated and painful. Area cleansed, dried, applied a barrier ring and a urostomy pouch to catch the drainage tonight until IR can replace J tube in AM. Urostomy pouch attached with an adapter to a bedside drainage bag. Maysie Parkhill P Nat Lowenthal

## 2017-03-05 NOTE — Discharge Summary (Signed)
Physician Discharge Summary  Casey Acevedo XBJ:[id number] DOB: [birth year] DOA: 03/03/2017  PCP: Idelle Crouch, MD  Admit date: 03/03/2017 Discharge date: 03/05/2017  Admitted From: Home Disposition: Home  Recommendations for Outpatient Follow-up:  1. Follow up with PCP in 1 week with repeat CBC/BMP 2. Follow-up with oncology/Dr. Janese Banks in a week 3. Follow-up with Dr. Servando Snare in 1-2 weeks 4. 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 He had presented on 03/03/2017 with weakness and persistent J-tube leakage. He was started on intravenous fluids and general surgery was consulted.  General surgery deferred the management to IR.  J-tube came out during the hospitalization. Patient underwent new J-tube placement by IR today.  Patient wants to be discharged home.  J-tube feeding will be started at home as per dietary recommendations.  Discharge Diagnoses:  Active Problems:   Dehydration   Malignant neoplasm of lower third of esophagus (HCC)   S/P jejunostomy (HCC)   Dehydration -Probably secondary to jejunostomy tube leak -Status post treatment with IV fluids  Persistent jejunostomy tube leak -Recent CAT scan of the abdomen on 02/25/2017 did not show any acute abnormalities -Patient was recently treated with antibiotics for skin cellulitis. Currently does not need any antibiotics -General surgery input appreciated. General surgery deferred the management  to IR. -Dressing changes as per wound care consult recommendations - J-tube came out last night.  Patient had J-tube placement by IR today.  Patient wants to be discharged home.  J-tube feeding will be started at home as per dietary recommendations.  Follow-up with IR regarding problems with J-tube.  Esophageal cancer currently undergoing chemotherapy and radiation - Spoke to oncologist/Dr. Janese Banks on phone on 03/03/2017 and discussed the management plan.  Patient will need outpatient oncology follow-up and follow-up with  Dr. Servando Snare as an outpatient.  Pancytopenia -Probably secondary to cancer and chemotherapy -Monitor - outpatient followup  Hypomagnesemia -Replaced.   Discharge Instructions  Discharge Instructions    Call MD for:  difficulty breathing, headache or visual disturbances   Complete by:  As directed    Call MD for:  extreme fatigue   Complete by:  As directed    Call MD for:  hives   Complete by:  As directed    Call MD for:  persistant dizziness or light-headedness   Complete by:  As directed    Call MD for:  persistant nausea and vomiting   Complete by:  As directed    Call MD for:  redness, tenderness, or signs of infection (pain, swelling, redness, odor or green/yellow discharge around incision site)   Complete by:  As directed    Call MD for:  severe uncontrolled pain   Complete by:  As directed    Call MD for:  temperature >100.4   Complete by:  As directed    Diet - low sodium heart healthy   Complete by:  As directed    Discharge instructions   Complete by:  As directed    J-tube feeding as per dietary recommendations   Increase activity slowly   Complete by:  As directed  Allergies as of 03/05/2017   No Known Allergies     Medication List    TAKE these medications   dexamethasone 4 MG tablet Commonly known as:  DECADRON 8 mg by Per J Tube route daily. Start the day after chemo for two days   feeding supplement (PRO-STAT SUGAR FREE  64) Liqd Place 30 mLs into feeding tube daily.   feeding supplement (VITAL 1.5 CAL) Liqd Give vital 1.5 via continuous pump at 43ml/hr for 22 hours.  Flush with 254ml of water before starting tube feeding and after stopping tube feeding.  Provide additional 248ml of water 2 other times during waking hours via J tube.   hydrocortisone cream-nystatin cream-zinc oxide Apply 1 application 4 (four) times daily topically.   loperamide 2 MG capsule Commonly known as:  IMODIUM Take 1 capsule (2 mg total) by mouth as needed for diarrhea or loose stools (take two tablests after the first loose stool, then 1 tablet after each loose stool). What changed:    how to take this  additional instructions   morphine 20 MG/ML concentrated solution Commonly known as:  ROXANOL Take 0.25 mLs (5 mg total) by mouth every 4 (four) hours as needed for severe pain.   nystatin powder Commonly known as:  MYCOSTATIN/NYSTOP Apply topically 2 (two) times daily. Apply to excoriated skin around J Tube. Cover with gauze   ondansetron 4 MG disintegrating tablet Commonly known as:  ZOFRAN-ODT TAKE 1 TABLET BY MOUTH EVERY 8 HOURS AS NEEDED FOR NAUSEA AND VOMITING   prochlorperazine 10 MG tablet Commonly known as:  COMPAZINE TAKE 1 TABLET (10 MG TOTAL) BY MOUTH EVERY 6 (SIX) HOURS AS NEEDED (NAUSEA OR VOMITING).      Follow-up Information    Idelle Crouch, MD. Schedule an appointment as soon as possible for a visit in 1 week(s).   Specialty:  Internal Medicine Contact information: [place] [address] [phone]        Sindy Guadeloupe, MD. Schedule an appointment as soon as possible for a visit in 1 week(s).   Specialty:  Oncology Contact information: [place] [address] [phone]        Grace Isaac, MD. Schedule an appointment as soon as possible for a visit in 2 week(s).   Specialty:  Cardiothoracic Surgery Contact  information: [address] [place] [address] [phone]          No Known Allergies  Consultations: Gen. Surgery; IR Spoke to oncologist/Dr. Janese Banks on phone on 03/03/2017       Procedures/Studies: Ct Abdomen Pelvis W Contrast  Result Date: 02/25/2017 CLINICAL DATA:  Abdominal pain, fever.  Esophageal cancer EXAM: CT ABDOMEN AND PELVIS WITH CONTRAST TECHNIQUE: Multidetector CT imaging of the abdomen and pelvis was performed using the standard protocol following bolus administration of intravenous contrast. CONTRAST:  135mL ISOVUE-300 IOPAMIDOL (ISOVUE-300) INJECTION 61% COMPARISON:  PET CT 01/03/2017 FINDINGS: Lower chest: Distal esophageal mass is again noted as seen on prior PET CT. Right base atelectasis. No effusions. Hepatobiliary: Small hypodensity in the left hepatic dome measures 10 mm, most compatible with small cysts. No other focal hepatic abnormality. Prior cholecystectomy. Pancreas: No focal abnormality or ductal dilatation. Spleen: Small calcifications throughout the spleen compatible with old granulomatous disease. Adrenals/Urinary Tract: No adrenal abnormality. No focal renal abnormality. No stones or hydronephrosis. Urinary bladder is unremarkable. Stomach/Bowel: Jejunostomy tube is in place. Stomach, large and small bowel grossly unremarkable. Vascular/Lymphatic:  Aortic and iliac calcifications. No aneurysm or adenopathy. Reproductive: Mild prostate prominence. Other: No free fluid or free air. Musculoskeletal: No acute bony abnormality. IMPRESSION: Distal esophageal mass again noted, stable since prior PET CT. Right base atelectasis. No acute findings in the abdomen or pelvis. Electronically Signed   By: Rolm Baptise M.D.   On: 02/25/2017 19:34   Ir Replc Duoden/jejuno Tube Percut W/fluoro  Result Date: 03/05/2017 INDICATION: Leaking jejunostomy, accidentally removed EXAM: FLUOROSCOPIC REPLACEMENT OF THE 16 FRENCH JEJUNOSTOMY MEDICATIONS: NONE.  ANESTHESIA/SEDATION: NONE. CONTRAST:  10 CC - administered into the gastric lumen. FLUOROSCOPY TIME:  Fluoroscopy Time:  18 seconds (3 mGy). COMPLICATIONS: None immediate. PROCEDURE: Informed written consent was obtained from the patient after a thorough discussion of the procedural risks, benefits and alternatives. All questions were addressed. Maximal Sterile Barrier Technique was utilized including caps, mask, sterile gowns, sterile gloves, sterile drape, hand hygiene and skin antiseptic. A timeout was performed prior to the initiation of the procedure. the existing left abdominal jejunostomy percutaneous tract was cannulated with a 16 french balloon retention catheter. balloon was inflated with 5 cc saline. contrast injection confirms position in the small bowel. catheter secured externally. no significant leakage demonstrated. feeding tube ready for use. IMPRESSION: FLUOROSCOPIC REPLACEMENT OF THE 16 FRENCH JEJUNOSTOMY. Electronically Signed   By: Jerilynn Mages.  Shick M.D.   On: 03/05/2017 11:21    J-tube placement by IR on 03/05/2017   Subjective: Patient seen and examined at bedside.  No overnight fever, nausea, vomiting  Discharge Exam: Vitals:   03/04/17 2054 03/05/17 0406  BP: (!) 151/79 (!) 153/86  Pulse: 100 98  Resp:    Temp: 97.9 F (36.6 C) 98.9 F (37.2 C)  SpO2: 95% 96%   Vitals:   03/04/17 0444 03/04/17 1403 03/04/17 2054 03/05/17 0406  BP: 136/72 (!) 144/84 (!) 151/79 (!) 153/86  Pulse: 90 100 100 98  Resp: 14 15    Temp: 97.7 F (36.5 C) (!) 97.5 F (36.4 C) 97.9 F (36.6 C) 98.9 F (37.2 C)  TempSrc: Oral Oral Oral Oral  SpO2: 97% 95% 95% 96%  Weight:   82.3 kg (181 lb 7 oz) 82 kg (180 lb 10.8 oz)  Height:         General exam: Appears calm and comfortable  Respiratory system: Bilateral decreased breath sound at bases Cardiovascular system: S1 & S2 heard, rate controlled  Gastrointestinal system: Abdomen is nondistended, soft and nontender. Normal bowel sounds heard.  Midline dressing present;mild surrounding skin erythema Extremities: No cyanosis, clubbing, edema       The results of significant diagnostics from this hospitalization (including imaging, microbiology, ancillary and laboratory) are listed below for reference.     Microbiology: Recent Results (from the past 240 hour(s))  Blood Culture (routine x 2)     Status: None   Collection Time: 02/25/17  9:40 PM  Result Value Ref Range Status   Specimen Description BLOOD LEFT HAND  Final   Special Requests   Final    BOTTLES DRAWN AEROBIC AND ANAEROBIC Blood Culture adequate volume   Culture NO GROWTH 5 DAYS  Final   Report Status 03/02/2017 FINAL  Final  Blood Culture (routine x 2)     Status: None   Collection Time: 02/25/17  9:40 PM  Result Value Ref Range Status   Specimen Description BLOOD LEFT WRIST  Final   Special Requests   Final    BOTTLES DRAWN AEROBIC AND ANAEROBIC Blood Culture results may not be optimal due  to an excessive volume of blood received in culture bottles   Culture NO GROWTH 5 DAYS  Final   Report Status 03/02/2017 FINAL  Final  Aerobic/Anaerobic Culture (surgical/deep wound)     Status: Abnormal   Collection Time: 02/25/17 10:20 PM  Result Value Ref Range Status   Specimen Description   Final    PEG SITE Performed at [facility], [address], [place], [place] [address]    Special Requests   Final    Immunocompromised Performed at [facility], [place], [place] [address]    Gram Stain   Final    NO WBC SEEN MODERATE GRAM NEGATIVE RODS FEW GRAM POSITIVE RODS FEW GRAM POSITIVE COCCI IN PAIRS    Culture (A)  Final    MULTIPLE ORGANISMS PRESENT, NONE PREDOMINANT NO ANAEROBES ISOLATED Performed at [facility], [place] [address], [place], [place] [address]    Report Status 03/03/2017 FINAL  Final     Labs: BNP (last 3 results) No results for input(s): BNP in the last 8760 hours. Basic Metabolic  Panel: Recent Labs  Lab 03/01/17 0840 03/03/17 1425 03/04/17 0338 03/05/17 0356  NA 135 132* 133* 132*  K 3.8 3.9 4.0 4.0  CL 105 102 105 104  CO2 23 23 23  21*  GLUCOSE 112* 105* 82 71  BUN 27* 24* 19 12  CREATININE 0.74 0.76 0.62 0.60*  CALCIUM 7.8* 7.4* 7.3* 7.6*  MG  --   --  1.7 1.6*   Liver Function Tests: Recent Labs  Lab 03/01/17 0840 03/03/17 1425 03/04/17 0338  AST 27 26 20   ALT 46 51 46  ALKPHOS 110 91 78  BILITOT 0.9 1.4* 1.3*  PROT 5.4* 4.9* 4.5*  ALBUMIN 2.8* 2.6* 2.3*   No results for input(s): LIPASE, AMYLASE in the last 168 hours. No results for input(s): AMMONIA in the last 168 hours. CBC: Recent Labs  Lab 03/01/17 0840 03/03/17 1425 03/04/17 0338 03/05/17 0356  WBC 10.4 3.6* 3.1* 1.6*  NEUTROABS 9.4*  --   --  1.0*  HGB 11.7* 11.2* 9.7* 10.1*  HCT 34.8* 32.2* 28.4* 29.9*  MCV 88.8 87.7 87.9 86.9  PLT 170 88* 75* 70*   Cardiac Enzymes: No results for input(s): CKTOTAL, CKMB, CKMBINDEX, TROPONINI in the last 168 hours. BNP: Invalid input(s): POCBNP CBG: Recent Labs  Lab 03/04/17 2335 03/05/17 0403 03/05/17 0713 03/05/17 0817 03/05/17 1218  GLUCAP 69 67 59* 113* 90   D-Dimer No results for input(s): DDIMER in the last 72 hours. Hgb A1c No results for input(s): HGBA1C in the last 72 hours. Lipid Profile No results for input(s): CHOL, HDL, LDLCALC, TRIG, CHOLHDL, LDLDIRECT in the last 72 hours. Thyroid function studies No results for input(s): TSH, T4TOTAL, T3FREE, THYROIDAB in the last 72 hours.  Invalid input(s): FREET3 Anemia work up No results for input(s): VITAMINB12, FOLATE, FERRITIN, TIBC, IRON, RETICCTPCT in the last 72 hours. Urinalysis    Component Value Date/Time   COLORURINE YELLOW 03/03/2017 [place] 03/03/2017 1358   LABSPEC 1.015 03/03/2017 1358   PHURINE 6.0 03/03/2017 1358   GLUCOSEU NEGATIVE 03/03/2017 1358   HGBUR NEGATIVE 03/03/2017 [place] 03/03/2017 [place] 03/03/2017 [place] 03/03/2017 1358   NITRITE NEGATIVE 03/03/2017 1358   LEUKOCYTESUR NEGATIVE 03/03/2017 1358   Sepsis Labs Invalid input(s): PROCALCITONIN,  WBC,  LACTICIDVEN Microbiology Recent Results (from the past 240 hour(s))  Blood Culture (  routine x 2)     Status: None   Collection Time: 02/25/17  9:40 PM  Result Value Ref Range Status   Specimen Description BLOOD LEFT HAND  Final   Special Requests   Final    BOTTLES DRAWN AEROBIC AND ANAEROBIC Blood Culture adequate volume   Culture NO GROWTH 5 DAYS  Final   Report Status 03/02/2017 FINAL  Final  Blood Culture (routine x 2)     Status: None   Collection Time: 02/25/17  9:40 PM  Result Value Ref Range Status   Specimen Description BLOOD LEFT WRIST  Final   Special Requests   Final    BOTTLES DRAWN AEROBIC AND ANAEROBIC Blood Culture results may not be optimal due to an excessive volume of blood received in culture bottles   Culture NO GROWTH 5 DAYS  Final   Report Status 03/02/2017 FINAL  Final  Aerobic/Anaerobic Culture (surgical/deep wound)     Status: Abnormal   Collection Time: 02/25/17 10:20 PM  Result Value Ref Range Status   Specimen Description   Final    PEG SITE Performed at [facility], [address] [place], [place] [address]    Special Requests   Final    Immunocompromised Performed at [facility], [place], [place] [address]    Gram Stain   Final    NO WBC SEEN MODERATE GRAM NEGATIVE RODS FEW GRAM POSITIVE RODS FEW GRAM POSITIVE COCCI IN PAIRS    Culture (A)  Final    MULTIPLE ORGANISMS PRESENT, NONE PREDOMINANT NO ANAEROBES ISOLATED Performed at [facility], [place] [address], [place], [place] [address]    Report Status 03/03/2017 FINAL  Final     Time coordinating discharge: 30 minutes  SIGNED:   Aline August, MD  [facility] 03/05/2017, 1:53 PM Pager: [phone]  If 7PM-7AM, please contact  night-coverage [email or web address] Password [email or web address]

## 2017-03-05 NOTE — Progress Notes (Signed)
Patient ID: Casey Acevedo, male   DOB: 02-26-1941, 76 y.o.   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 He had presented on 03/03/2017 with weakness and persistent J-tube leakage. He was started on intravenous fluids and general surgery was consulted.  Assessment & Plan:   Active Problems:   Dehydration   Malignant neoplasm of lower third of esophagus (HCC)   S/P jejunostomy (HCC)    Dehydration -Probably secondary to jejunostomy tube leak -Change IV fluids to D5 normal saline  Persistent jejunostomy tube leak -Recent CAT scan of the abdomen on 02/25/2017 did not show any acute abnormalities -Patient was recently treated with antibiotics for skin cellulitis.  Currently does not need any antibiotics -General surgery input appreciated. General surgery has deferred the management to IR. IR consult was already placed on admission. IR evaluation pending.  Patient was scheduled to have outpatient IR procedure for the J-tube on 03/05/2017 - Wound care consulted - J-tube came out last night; patient is planned for some kind of intervention by IR today  Esophageal cancer currently undergoing chemotherapy and radiation -Patient was sent to the ED by oncology.   spoke to oncologist/Dr. Janese Banks on phone on 03/03/2017 and discussed the management plan. She had suggested surgical evaluation regarding J-tube drainage. Patient will need outpatient oncology follow-up and follow-up with  Dr. Servando Snare as an outpatient.  Pancytopenia -Probably secondary to cancer and  chemotherapy -Monitor - Repeat a.m. labs  Hypomagnesemia -Replace. Monitor a.m. Labs  Generalized deconditioning -PT eval  DVT prophylaxis: Lovenox Code Status:  Full Family Communication: None at bedside Disposition Plan: Home in 1-2 days  Consultants: Gen. Surgery; IR Spoke to oncologist/Dr. Janese Banks on phone on 03/03/2017  Procedures: None  Antimicrobials: None    Subjective: Patient seen and examined at bedside. He does not feel any significant change since admission. he still feels very weak. No overnight fever or vomiting. His J-tube came out last night  Objective: Vitals:   03/04/17 0444 03/04/17 1403 03/04/17 2054 03/05/17 0406  BP: 136/72 (!) 144/84 (!) 151/79 (!) 153/86  Pulse: 90 100 100 98  Resp: 14 15    Temp: 97.7 F (36.5 C) (!) 97.5 F (36.4 C) 97.9 F (36.6 C) 98.9 F (37.2 C)  TempSrc: Oral Oral Oral Oral  SpO2: 97% 95% 95% 96%  Weight:   82.3 kg (181 lb 7 oz) 82 kg (180 lb 10.8 oz)  Height:        Intake/Output Summary (Last 24 hours) at 03/05/2017 1016 Last data filed at 03/05/2017 0800 Gross per 24 hour  Intake 3028.33 ml  Output 2225 ml  Net 803.33 ml   Filed Weights   03/03/17 1829 03/04/17 2054 03/05/17 0406  Weight: 83.1 kg (183 lb 3.2 oz) 82.3 kg (181 lb 7 oz) 82 kg (180 lb 10.8 oz)    Examination:  General exam: Appears calm and comfortable  Respiratory system: Bilateral decreased breath sound at bases Cardiovascular system: S1 & S2 heard, rate controlled  Gastrointestinal system: Abdomen is nondistended, soft and nontender. Normal bowel sounds heard. Midline dressing present; mild surrounding skin erythema Extremities: No cyanosis, clubbing, edema   Data  Reviewed: I have personally reviewed following labs and imaging studies  CBC: Recent Labs  Lab 03/01/17 0840 03/03/17 1425 03/04/17 0338 03/05/17 0356  WBC 10.4 3.6* 3.1* 1.6*  NEUTROABS 9.4*  --   --  1.0*  HGB 11.7* 11.2* 9.7* 10.1*  HCT 34.8* 32.2* 28.4* 29.9*   MCV 88.8 87.7 87.9 86.9  PLT 170 88* 75* 70*   Basic Metabolic Panel: Recent Labs  Lab 03/01/17 0840 03/03/17 1425 03/04/17 0338 03/05/17 0356  NA 135 132* 133* 132*  K 3.8 3.9 4.0 4.0  CL 105 102 105 104  CO2 23 23 23  21*  GLUCOSE 112* 105* 82 71  BUN 27* 24* 19 12  CREATININE 0.74 0.76 0.62 0.60*  CALCIUM 7.8* 7.4* 7.3* 7.6*  MG  --   --  1.7 1.6*   GFR: Estimated Creatinine Clearance: 78.6 mL/min (A) (by C-G formula based on SCr of 0.6 mg/dL (L)). Liver Function Tests: Recent Labs  Lab 03/01/17 0840 03/03/17 1425 03/04/17 0338  AST 27 26 20   ALT 46 51 46  ALKPHOS 110 91 78  BILITOT 0.9 1.4* 1.3*  PROT 5.4* 4.9* 4.5*  ALBUMIN 2.8* 2.6* 2.3*   No results for input(s): LIPASE, AMYLASE in the last 168 hours. No results for input(s): AMMONIA in the last 168 hours. Coagulation Profile: No results for input(s): INR, PROTIME in the last 168 hours. Cardiac Enzymes: No results for input(s): CKTOTAL, CKMB, CKMBINDEX, TROPONINI in the last 168 hours. BNP (last 3 results) No results for input(s): PROBNP in the last 8760 hours. HbA1C: No results for input(s): HGBA1C in the last 72 hours. CBG: Recent Labs  Lab 03/04/17 2101 03/04/17 2335 03/05/17 0403 03/05/17 0713 03/05/17 0817  GLUCAP 68 69 67 59* 113*   Lipid Profile: No results for input(s): CHOL, HDL, LDLCALC, TRIG, CHOLHDL, LDLDIRECT in the last 72 hours. Thyroid Function Tests: No results for input(s): TSH, T4TOTAL, FREET4, T3FREE, THYROIDAB in the last 72 hours. Anemia Panel: No results for input(s): VITAMINB12, FOLATE, FERRITIN, TIBC, IRON, RETICCTPCT in the last 72 hours. Sepsis Labs: No results for input(s): PROCALCITON, LATICACIDVEN in the last 168 hours.  Recent Results (from the past 240 hour(s))  Blood Culture (routine x 2)     Status: None   Collection Time: 02/25/17  9:40 PM  Result Value Ref Range Status   Specimen Description BLOOD LEFT HAND  Final   Special Requests   Final    BOTTLES  DRAWN AEROBIC AND ANAEROBIC Blood Culture adequate volume   Culture NO GROWTH 5 DAYS  Final   Report Status 03/02/2017 FINAL  Final  Blood Culture (routine x 2)     Status: None   Collection Time: 02/25/17  9:40 PM  Result Value Ref Range Status   Specimen Description BLOOD LEFT WRIST  Final   Special Requests   Final    BOTTLES DRAWN AEROBIC AND ANAEROBIC Blood Culture results may not be optimal due to an excessive volume of blood received in culture bottles   Culture NO GROWTH 5 DAYS  Final   Report Status 03/02/2017 FINAL  Final  Aerobic/Anaerobic Culture (surgical/deep wound)     Status: Abnormal   Collection Time: 02/25/17 10:20 PM  Result Value Ref Range Status   Specimen Description   Final    PEG SITE Performed at [facility], [address], [place], [place] [address]    Special Requests   Final    Immunocompromised Performed at [facility], [place],  [place], [place] [address]    Gram Stain   Final    NO WBC SEEN MODERATE GRAM NEGATIVE RODS FEW GRAM POSITIVE RODS FEW GRAM POSITIVE COCCI IN PAIRS    Culture (A)  Final    MULTIPLE ORGANISMS PRESENT, NONE PREDOMINANT NO ANAEROBES ISOLATED Performed at [facility], [place] [address] [place], [place] [address]    Report Status 03/03/2017 FINAL  Final         Radiology Studies: No results found.      Scheduled Meds: . dextrose  25 mL Intravenous Once  . dextrose      . enoxaparin (LOVENOX) injection  40 mg Subcutaneous Q24H  . nystatin   Topical BID   Continuous Infusions: . dextrose 5 % and 0.9% NaCl 75 mL/hr at 03/05/17 0804  . feeding supplement (JEVITY 1.2 CAL)    . feeding supplement (VITAL 1.5 CAL)    . magnesium sulfate 1 - 4 g bolus IVPB       LOS: 0 days        Casey August, MD [facility] Pager [phone]  If 7PM-7AM, please contact night-coverage [email or web address] Password [email or web address] 03/05/2017, 10:16 AM

## 2017-03-05 NOTE — Progress Notes (Signed)
Hypoglycemic Event  CBG: 59  Treatment: D50 IV 25 mL  Symptoms: Shaky  Follow-up CBG: Time: 1610 CBG Result 113  Possible Reasons for Event: Other: NPO  Comments/MD notified:Dr Valene Bors, Darlina Sicilian

## 2017-03-05 NOTE — Care Management Obs Status (Signed)
[place] NOTIFICATION   Patient Details  Name: Casey Acevedo MRN: [id number] Date of Birth: 1940/08/12   Medicare Observation Status Notification Given:  Yes    Guadalupe Maple, RN 03/05/2017, 11:37 AM

## 2017-03-05 NOTE — Progress Notes (Signed)
Nutrition Brief Note  RD received call from RN that pt's J-tube is ready to be used and TF to be restarted. Pt to discharge this afternoon.  Pt aware to continue feeds over 18 hours at home.  Will continue feeds at this time at goal rate of 80 ml/hr to ensure that tube is working correctly. Pt was tolerating this rate well at home despite previous tube leaking.  Clayton Bibles, MS, RD, [place] Dietitian Pager: [phone] After Hours Pager: [phone]

## 2017-03-07 ENCOUNTER — Other Ambulatory Visit: Payer: Self-pay | Admitting: *Deleted

## 2017-03-07 ENCOUNTER — Ambulatory Visit
Admission: RE | Admit: 2017-03-07 | Discharge: 2017-03-07 | Disposition: A | Discharge status: Home / Self Care | Payer: Medicare Other | Source: Ambulatory Visit | Attending: Radiation Oncology | Admitting: Radiation Oncology

## 2017-03-07 DIAGNOSIS — C155 Malignant neoplasm of lower third of esophagus: Secondary | ICD-10-CM | POA: Diagnosis not present

## 2017-03-08 ENCOUNTER — Other Ambulatory Visit: Payer: Self-pay

## 2017-03-08 ENCOUNTER — Other Ambulatory Visit: Payer: Self-pay | Admitting: *Deleted

## 2017-03-08 ENCOUNTER — Encounter: Payer: Self-pay | Admitting: Cardiothoracic Surgery

## 2017-03-08 ENCOUNTER — Ambulatory Visit (INDEPENDENT_AMBULATORY_CARE_PROVIDER_SITE_OTHER): Payer: Medicare Other | Admitting: Cardiothoracic Surgery

## 2017-03-08 VITALS — BP 140/82 | HR 115 | Resp 20 | Ht 69.0 in | Wt 180.0 lb

## 2017-03-08 DIAGNOSIS — C155 Malignant neoplasm of lower third of esophagus: Secondary | ICD-10-CM | POA: Diagnosis not present

## 2017-03-08 NOTE — Progress Notes (Signed)
[address]       ,[place] [address]             [phone]                    Lino D Gensel [place] Medical Record #[id number] Date of Birth: 1940/12/05  Referring: Idelle Crouch, MD Primary Care: Idelle Crouch, MD  Chief Complaint: Advanced stage esophageal cancer  History of Present Illness:    Casey Acevedo 76 y.o. male previously seen for  evaluation of  diagnosis of esophageal cancer.  The patient had  3 month history of dysphagia starting in the late summer .  He underwent endoscopy at the end of September with negative biopsy for malignancy .   December 26 2016 a repeat endoscopy with dilatation was performed .  The patient notes severe chest pain following the dilatation   Repeat scans showed no evidence of perforation.  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  Treated with PICC and TPN.    Patient denies any lifelong problem with reflux, he does note that his 19 year old brother is alive with history of Barrett's Previous surgery includes only laparoscopic cholecystectomy in 2009  Patient has very few in the way of long-term medical problems, notes he takes no prescription medications, has had no previous cardiac history. He smoked briefly while in college but none for more than 50 years.  Denies any alcohol intake for at least the last 25 years.   Patient has continued to have problems with vomiting.  Since last seen he has been hospitalized both at [facility] and at [facility] for several days each time.  He has had trouble with his severe skin excoriation around the jejunostomy tube.  Last week the tube was replaced, he has had no further leaking around it and the skin appears to be improving with topical creams.   Patient completed course of chemotherapy December 20, he completed radiation December 24    Current Activity/ Functional Status:  Patient is independent with mobility/ambulation,  transfers, ADL's, IADL's.   Zubrod Score: At the time of surgery this patient's most appropriate activity status/level should be described as: []     0    Normal activity, no symptoms []     1    Restricted in physical strenuous activity but ambulatory, able to do out light work [x]     2    Ambulatory and capable of self care, unable to do work activities, up and about               >50 % of waking hours                              []     3    Only limited self care, in bed greater than 50% of waking hours []     4    Completely disabled, no self care, confined to bed or chair []     5    Moribund   Past Medical History:  Diagnosis Date  . Cancer (HCC)    esophageal  . Dysphagia   . GERD (gastroesophageal reflux disease)     Past Surgical History:  Procedure Laterality Date  . ABDOMINAL HYSTERECTOMY    . CHOLECYSTECTOMY    . ESOPHAGOGASTRODUODENOSCOPY (EGD) WITH PROPOFOL N/A 12/07/2016   Procedure: ESOPHAGOGASTRODUODENOSCOPY (EGD) WITH PROPOFOL;  Surgeon:  Jonathon Bellows, MD;  Location: [facility] ENDOSCOPY;  Service: Gastroenterology;  Laterality: N/A;  . ESOPHAGOGASTRODUODENOSCOPY (EGD) WITH PROPOFOL N/A 12/26/2016   Procedure: ESOPHAGOGASTRODUODENOSCOPY (EGD) WITH PROPOFOL WITH DILATION;  Surgeon: Jonathon Bellows, MD;  Location: [facility] ENDOSCOPY;  Service: Gastroenterology;  Laterality: N/A;  . EYE SURGERY    . GASTROJEJUNOSTOMY N/A 01/16/2017   Procedure: LAPROSCOPIC ASSIST FEEDING JEJUNOSTOMY TUBE;  Surgeon: Johnathan Hausen, MD;  Location: [facility] ORS;  Service: General;  Laterality: N/A;  . IR FLUORO GUIDE PORT INSERTION RIGHT  01/10/2017  . IR REPLC DUODEN/JEJUNO TUBE PERCUT W/FLUORO  03/05/2017  . PICC LINE INSERTION Right     Family History  Problem Relation Age of Onset  . Heart failure Father   . Prostate cancer Neg Hx   . Bladder Cancer Neg Hx   . Kidney cancer Neg Hx    Patient spotted at age 41 with complications after gallbladder surgery, mother died at age 81 with a perforated  gastric ulcer and history of breast cancer, he has 1 brother at age 53 with alive with heart trouble and diabetes and also known Barrett's esophagus.  One brother died in the [place] in 22 at age 5 of myocardial infarction.  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  No Known Allergies  Current Outpatient Medications  Medication Sig Dispense Refill  . Amino Acids-Protein Hydrolys (FEEDING SUPPLEMENT, PRO-STAT SUGAR FREE 64,) LIQD Place 30 mLs into feeding tube daily. 900 mL 0  . dexamethasone (DECADRON) 4 MG tablet 8 mg by Per J Tube route daily. Start the day after chemo for two days     . hydrocortisone cream-nystatin cream-zinc oxide Apply 1 application 4 (four) times daily topically. 4 oz 3  . loperamide (IMODIUM) 2 MG capsule Take 1 capsule (2 mg total) by mouth as needed for diarrhea or loose stools (take two tablests after the first loose stool, then 1 tablet after each loose stool).    . morphine (ROXANOL) 20 MG/ML concentrated solution Take 0.25 mLs (5 mg total) by mouth every 4 (four) hours as needed for severe pain. 30 mL 0  . Nutritional Supplements (FEEDING SUPPLEMENT, VITAL 1.5 CAL,) LIQD Give vital 1.5 via continuous pump at 87ml/hr for 22 hours.  Flush with 265ml of water before starting tube feeding and after stopping tube feeding.  Provide additional 274ml of water 2 other times during waking hours via J tube. 1540 mL 12  . nystatin (MYCOSTATIN/NYSTOP) powder Apply topically 2 (two) times daily. Apply to excoriated skin around J Tube. Cover with gauze 15 g 0  .  ondansetron (ZOFRAN-ODT) 4 MG disintegrating tablet TAKE 1 TABLET BY MOUTH EVERY 8 HOURS AS NEEDED FOR NAUSEA AND VOMITING  0  . prochlorperazine (COMPAZINE) 10 MG tablet TAKE 1 TABLET (10 MG TOTAL) BY MOUTH EVERY 6 (SIX) HOURS AS NEEDED (NAUSEA OR VOMITING).  1   No current facility-administered medications for this visit.    Facility-Administered Medications Ordered in Other Visits  Medication Dose Route Frequency Provider Last Rate Last Dose  . 0.9 %  sodium chloride infusion   Intravenous Once Sindy Guadeloupe, MD      . ondansetron (ZOFRAN) 8 mg, dexamethasone (DECADRON) 10 mg in sodium chloride 0.9 % 50 mL IVPB   Intravenous Once Sindy Guadeloupe, MD        Pertinent items are noted in HPI.   Review of Systems:  Review of Systems  Constitutional: Positive for chills, malaise/fatigue and weight loss. Negative for diaphoresis and fever.  HENT: Negative.   Eyes: Negative.   Respiratory: Negative.   Cardiovascular: Negative.   Gastrointestinal: Positive for abdominal pain, heartburn, nausea and vomiting. Negative for blood in stool, constipation, diarrhea and melena.  Genitourinary: Negative.   Musculoskeletal: Positive for myalgias. Negative for back pain, falls, joint pain and neck pain.  Skin: Positive for rash. Negative for itching.  Neurological: Positive for dizziness and weakness. Negative for tingling, tremors, sensory change, speech change, focal weakness, seizures, loss of consciousness and headaches.  Endo/Heme/Allergies: Negative.   Psychiatric/Behavioral: Negative.       Physical Exam: BP 140/82 (BP Location: Right Arm, Patient Position: Sitting, Cuff Size: Large)   Pulse (!) 115   Resp 20   Ht 5\' 9"  (1.753 m)   Wt 180 lb (81.6 kg)   SpO2 95% Comment: on RA  BMI 26.58 kg/m   Wt Readings from Last 3 Encounters:  03/08/17 180 lb (81.6 kg)  03/05/17 180 lb 10.8 oz (82 kg)  03/01/17 182 lb (82.6 kg)   PHYSICAL EXAMINATION: General appearance: alert, cooperative,  appears older than stated age and fatigued Head: Normocephalic, without obvious abnormality, atraumatic Neck: no adenopathy, no carotid bruit, no JVD, supple, symmetrical, trachea midline and thyroid not enlarged, symmetric, no tenderness/mass/nodules Lymph nodes: Cervical, supraclavicular, and axillary nodes normal. Resp: clear to auscultation bilaterally Back: symmetric, no curvature. ROM normal. No CVA tenderness. Cardio: regular rate and rhythm, S1, S2 normal, no murmur, click, rub or gallop GI: , Surrounding excoriation of the skin appears to be improving midline incision is healed feeding tube in place feeding tube in place Extremities: extremities normal, atraumatic, no cyanosis or edema and Homans sign is negative, no sign of DVT Neurologic: Grossly normal   Diagnostic Studies & Laboratory data:     Recent Radiology Findings:   Nm Pet Image Initial (pi) Skull Base To Thigh  Result Date: 01/03/2017 CLINICAL DATA:  Initial treatment strategy for esophageal cancer. EXAM: NUCLEAR MEDICINE PET SKULL BASE TO THIGH TECHNIQUE: Twelve point for mCi F-18 FDG was injected intravenously. Full-ring PET imaging was performed from the skull base to thigh after the radiotracer. CT data was obtained and used for attenuation correction and anatomic localization. FASTING BLOOD GLUCOSE:  Value: 127 mg/dl COMPARISON:  CT chest abdomen pelvis 12/26/2016. FINDINGS: NECK: No hypermetabolic lymph nodes in the neck. CT images show no acute findings. CHEST: No hypermetabolic mediastinal, hilar or axillary lymph nodes. Distal esophageal mass measures approximately 2.5 x 2.7 cm with an SUV max of 8.0. No adjacent hypermetabolic adenopathy. No hypermetabolic pulmonary nodules. Atherosclerotic calcification of the arterial vasculature, including coronary arteries. Right PICC tip terminates at the SVC RA junction. Heart is mildly enlarged. No pericardial or pleural effusion. Esophagus is dilated throughout its course, to  the level of the above-described esophageal mass. ABDOMEN/PELVIS: No abnormal hypermetabolism in the liver, adrenal glands, spleen or pancreas. No hypermetabolic lymph nodes. Subcentimeter low-attenuation lesion in the dome of the liver is too small to characterize. Cholecystectomy. Adrenal glands, kidneys, spleen, pancreas, stomach and bowel are grossly unremarkable. Atherosclerotic calcification  of the arterial vasculature without aneurysm. Prostate is mildly enlarged. No free fluid. SKELETON: No abnormal osseous hypermetabolism. IMPRESSION: 1. Hypermetabolic distal esophageal mass with associated esophageal dilatation. No hypermetabolic adenopathy or distant metastatic disease. 2. Aortic atherosclerosis (ICD10-170.0). Coronary artery calcification. Electronically Signed   By: Lorin Picket M.D.   On: 01/03/2017 14:44   Ct Chest Wo Contrast/ Ct Abdomen Wo Contrast  Result Date: 12/26/2016 CLINICAL DATA:  Esophageal cancer. Gastroesophageal reflux disease. Dysphagia. Endoscopy from earlier today demonstrating esophageal stenosis at approximately 40 cm from the incisors. Rule out perforation of esophagus. EXAM: CT CHEST, ABDOMEN WITH CONTRAST TECHNIQUE: Multidetector CT imaging of the chest, abdomen was performed following the standard protocol during bolus administration of intravenous contrast. CONTRAST:  75 cc of Isovue-300 COMPARISON:  Esophagram of earlier today.  Prior CTs of 12/11/2016. FINDINGS: CT CHEST FINDINGS Cardiovascular: Aortic and branch vessel atherosclerosis. Normal heart size, without pericardial effusion. Multivessel coronary artery atherosclerosis. A right-sided PICC line terminates at the high right atrium. Mediastinum/Nodes: No supraclavicular adenopathy. No mediastinal or definite hilar adenopathy, given limitations of unenhanced CT. Soft tissue fullness at the distal esophagus including on image 50/series 2. This is similar. The more proximal esophagus is dilated and contrast filled  from today's esophagram. No extraluminal contrast identified. No pneumomediastinum. Lungs/Pleura: No pleural fluid. Right upper lobe subpleural calcified granuloma on image 65/series 4. 5 mm lingular nodule on image 97/series 4 is is similar to on the prior. Minimal subpleural left lower lobe nodularity at 3 mm on image 127/series 4. Not readily apparent on the prior, favored to represent a subpleural lymph node. Musculoskeletal: No acute osseous abnormality. CT ABDOMEN  FINDINGS Hepatobiliary: High left hepatic lobe subcentimeter low-density lesion is likely a cyst. Cholecystectomy, without biliary ductal dilatation. Pancreas: Pancreatic atrophy, without duct dilatation or dominant mass. Spleen: Old granulomatous disease in the spleen. Adrenals/Urinary Tract: Normal adrenal glands. No renal calculi or hydronephrosis. Stomach/Bowel: Normal appearance of the stomach. Normal abdominal bowel loops. Vascular/Lymphatic: Aortic and branch vessel atherosclerosis. Gastrohepatic ligament adenopathy is again identified, including at 1.3 cm on image 53/series 2. Other:  No ascites.  No abdominal peritoneal or omental metastasis. Musculoskeletal: No acute osseous abnormality. IMPRESSION: 1. Soft tissue fullness in the distal esophagus, likely representing the primary site of malignancy. This is at least partially obstructive, with contrast from today's esophagram identified within a dilated more superior esophagus. 2. Isolated gastrohepatic ligament adenopathy, highly suspicious for metastatic disease. 3. No evidence of esophageal perforation. 4. Nonspecific lingular nodule, similar. 5. Coronary artery atherosclerosis. Aortic Atherosclerosis (ICD10-I70.0). Electronically Signed   By: Abigail Miyamoto M.D.   On: 12/26/2016 14:58   Ct Chest W Contrast Ct Abdomen Pelvis W Contrast  Result Date: 12/11/2016 CLINICAL DATA:  Patient with progressive difficulty swallowing. EXAM: CT CHEST, ABDOMEN, AND PELVIS WITH CONTRAST TECHNIQUE:  Multidetector CT imaging of the chest, abdomen and pelvis was performed following the standard protocol during bolus administration of intravenous contrast. CONTRAST:  122mL ISOVUE-300 IOPAMIDOL (ISOVUE-300) INJECTION 61% COMPARISON:  CT abdomen pelvis 05/11/2006 FINDINGS: CT CHEST FINDINGS Cardiovascular: Normal heart size. Coronary arterial vascular calcifications. Thoracic aortic vascular calcifications. Right upper extremity PICC line tip terminates in the superior vena cava. Mediastinum/Nodes: No enlarged axillary, mediastinal or hilar lymphadenopathy. There is wall thickening and narrowing of the distal esophagus (image 50; series 2). Lungs/Pleura: There is a 7 mm subpleural nodule in the lingula (image 96; series 4). Calcified granuloma within the medial right upper lobe (image 63; series 4). Dependent atelectasis/ scarring within the lower  lobes bilaterally. No pleural effusion or pneumothorax. Musculoskeletal: Thoracic spine degenerative changes. No aggressive or acute appearing osseous lesions. CT ABDOMEN PELVIS FINDINGS Hepatobiliary: The liver is normal in size and contour. Subcentimeter too small to characterize low-attenuation lesion left hepatic lobe (image 50 ; series 2). Fatty deposition adjacent to the falciform ligament. Status post cholecystectomy. No intrahepatic or extrahepatic biliary ductal dilatation. Pancreas: Mildly atrophic. Spleen: Calcified granulomas in the spleen. Adrenals/Urinary Tract: The adrenal glands are normal. Kidneys enhance symmetrically with contrast. No hydronephrosis. Urinary bladder is unremarkable. Stomach/Bowel: Colon is decompressed, limiting evaluation. Suggestion of mild colonic wall thickening. Normal morphology of the stomach. There is a 1.3 cm soft tissue nodule adjacent to the GE junction (image 53; series 2). Vascular/Lymphatic: Normal caliber abdominal aorta. Peripheral calcified atherosclerotic plaque. No retroperitoneal lymphadenopathy. Reproductive:  Prostate unremarkable. Other: Bilateral fat containing inguinal hernias. Musculoskeletal: Lumbar spine degenerative changes. No aggressive or acute appearing osseous lesions. IMPRESSION: There is a 1.3 cm soft tissue nodule adjacent to the GE junction. This is favored to represent an enlarged lymph node, potentially reactive or metastatic in etiology. The colon is decompressed however there is suggestion of wall thickening, raising the possibility of colitis. There is a 7 mm nodule within the left upper lobe. Non-contrast chest CT at 6-12 months is recommended. If the nodule is stable at time of repeat CT, then future CT at 18-24 months (from today's scan) is considered optional for low-risk patients, but is recommended for high-risk patients. This recommendation follows the consensus statement: Guidelines for Management of Incidental Pulmonary Nodules Detected on CT Images: From the Fleischner Society 2017; Radiology 2017; 284:228-243. Narrowing and wall thickening of the distal esophagus, compatible with known distal esophageal stricture. Electronically Signed   By: Lovey Newcomer M.D.   On: 12/11/2016 20:02   Dg Esophagus  Result Date: 12/06/2016 CLINICAL DATA:  Difficulty swallowing, constant vomiting EXAM: ESOPHOGRAM/BARIUM SWALLOW TECHNIQUE: Single contrast examination was performed using  thick barium. FLUOROSCOPY TIME:  Fluoroscopy Time:  0.5 minute Radiation Exposure Index (if provided by the fluoroscopic device): 5.7 mGy Number of Acquired Spot Images: 0 COMPARISON:  None. FINDINGS: There was normal pharyngeal anatomy and motility. Patient is extremely nauseous. Mild esophageal dilatation with a high-grade stricture of the distal esophagus with irregular margins. IMPRESSION: 1. 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 Recommend upper endoscopy. Electronically Signed   By: Kathreen Devoid    On: 12/06/2016 11:53   Dg Esophagus W/water Sol Cm  Result Date: 12/26/2016 CLINICAL DATA:  Recent esophageal instrumentation. Pain. Evaluate for esophageal rupture. EXAM: ESOPHOGRAM/BARIUM SWALLOW TECHNIQUE: Single contrast examination was performed using water-soluble contrast. FLUOROSCOPY TIME:  1 minutes and 6 seconds COMPARISON:  None. FINDINGS: The patient ingested water-soluble contrast and imaging was obtained. There is an obstruction of the distal esophagus, just above the gastroesophageal junction. A fluid level exists in the esophagus. Contrast never traversed beyond the distal esophagus. The patient vomited. There is no evidence of extravasation to suggest leak or rupture. IMPRESSION: No evidence of contrast extravasation to suggest esophageal injury or perforation There is abrupt obstruction of the distal esophagus just above the gastroesophageal junction. Electronically Signed   By: Marybelle Killings M.D.   On: 12/26/2016 14:46     I have independently reviewed the above radiology studies  and reviewed the findings with the patient.   Recent Lab Findings: Lab Results  Component Value Date   WBC 1.6 (  L) 03/05/2017   HGB 10.1 (L) 03/05/2017   HCT 29.9 (L) 03/05/2017   PLT 70 (L) 03/05/2017   GLUCOSE 71 03/05/2017   TRIG 92 12/11/2016   ALT 46 03/04/2017   AST 20 03/04/2017   NA 132 (L) 03/05/2017   K 4.0 03/05/2017   CL 104 03/05/2017   CREATININE 0.60 (L) 03/05/2017   BUN 12 03/05/2017   CO2 21 (L) 03/05/2017   INR 1.04 01/10/2017   Path 12/26/2016  Collected: 12/26/16 1152  Resulting lab: [id number]  Value: Surgical Pathology  CASE: [id number]  PATIENT: Casey Acevedo  Surgical Pathology Report      SPECIMEN SUBMITTED:  A. 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 ESOPHAGUS STRICTURE; COLD BIOPSY:  -  SQUAMOUS CELL CARCINOMA.   Comment:  These findings were communicated to Dr. Vicente Males on 12/27/2016.   GROSS DESCRIPTION:   A. Labeled: esophageal stricture C BX   Tissue fragment(s): 3   Size: 0.1-0.4 cm   Description: pink fragments   Entirely submitted in 1 cassette(s).     Final Diagnosis performed by Quay Burow, MD. Electronically signed  12/27/2016 2:05:42PM     The electronic signature indicates that the named Attending Pathologist  has evaluated the specimen   Technical component performed at [facility], [address], [place],  [place] [address]  Lab: [phone] Dir: Darrick Penna. Evette Doffing, MD   Professional component performed at [facility], [facility],  [place], [place], [place] [address]  Lab: [phone] Dir: Dellia Nims. Rubinas, MD    ENDO:12/26/2016 One severe stenosis was found 38 to 40 cm from the incisors. This measured 6 mm (inner diameter) x 2 cm (in length) and was traversed after downsizing scope and dilating. A guide wire was placed, then the scope was withdrawn. Using the wire as a guide, dilation with an 10-19-08 mm pyloric balloon dilator was performed under fluoroscopic guidance. The dilation site was examined following endoscope reinsertion and showed mild improvement in luminal narrowing. We were unable to pass the adult scope , we subsequently further dilated the stricture with a CRE dilator serially from 10-12 mm and subsequently the adult scope was able to pass through. The stricture still appeared to be very tight and the scope was not able to easily glide past. Biopsies were taken with a cold forceps for histology.     Assessment / Plan:   Patient continues to have a difficult time tolerating chemotherapy and radiation.  The current feeding tube seems to have improved the situation with less leakage around the tube irritating the skin.  Patient has now completed chemotherapy and radiation and hopefully over the next 4-6  weeks his overall condition will improve so we can consider surgical resection Plan to see him back January 24.  He has a follow-up PET scan ordered for February 1.    Grace Isaac MD      [address] [place] [address] Office [phone]   Beeper [phone]  03/08/2017 9:06 AM

## 2017-03-09 ENCOUNTER — Inpatient Hospital Stay: Payer: Medicare Other

## 2017-03-09 ENCOUNTER — Telehealth: Payer: Self-pay

## 2017-03-09 ENCOUNTER — Ambulatory Visit: Payer: Medicare Other

## 2017-03-09 DIAGNOSIS — Z95828 Presence of other vascular implants and grafts: Secondary | ICD-10-CM

## 2017-03-09 DIAGNOSIS — E860 Dehydration: Secondary | ICD-10-CM

## 2017-03-09 DIAGNOSIS — R112 Nausea with vomiting, unspecified: Secondary | ICD-10-CM

## 2017-03-09 DIAGNOSIS — C155 Malignant neoplasm of lower third of esophagus: Secondary | ICD-10-CM

## 2017-03-09 DIAGNOSIS — Z5111 Encounter for antineoplastic chemotherapy: Secondary | ICD-10-CM | POA: Diagnosis not present

## 2017-03-09 MED ORDER — SODIUM CHLORIDE 0.9 % IV SOLN
Freq: Once | INTRAVENOUS | Status: AC
  Administered 2017-03-09: 10:00:00 via INTRAVENOUS
  Filled 2017-03-09: qty 1000

## 2017-03-09 MED ORDER — DEXAMETHASONE SODIUM PHOSPHATE 10 MG/ML IJ SOLN
10.0000 mg | Freq: Once | INTRAMUSCULAR | Status: AC
  Administered 2017-03-09: 10 mg via INTRAVENOUS
  Filled 2017-03-09: qty 1

## 2017-03-09 MED ORDER — HEPARIN SOD (PORK) LOCK FLUSH 100 UNIT/ML IV SOLN
500.0000 [IU] | Freq: Once | INTRAVENOUS | Status: AC
  Administered 2017-03-09: 500 [IU] via INTRAVENOUS
  Filled 2017-03-09: qty 5

## 2017-03-09 MED ORDER — ONDANSETRON HCL 40 MG/20ML IJ SOLN
Freq: Once | INTRAMUSCULAR | Status: DC
Start: 2017-03-09 — End: 2017-03-09

## 2017-03-09 MED ORDER — SODIUM CHLORIDE 0.9% FLUSH
10.0000 mL | INTRAVENOUS | Status: DC | PRN
  Administered 2017-03-09: 10 mL via INTRAVENOUS
  Filled 2017-03-09: qty 10

## 2017-03-09 MED ORDER — ONDANSETRON HCL 4 MG/2ML IJ SOLN
8.0000 mg | Freq: Once | INTRAMUSCULAR | Status: AC
  Administered 2017-03-09: 8 mg via INTRAVENOUS
  Filled 2017-03-09: qty 4

## 2017-03-09 NOTE — Telephone Encounter (Signed)
Nutrition Follow-up:   Called for nutrition follow-up for patient.  Noted patient recently admitted to [facility] and J-tube replaced.  Patient reports tube is working better.  Reports that he is tolerating vital 1.5 at 81ml/hr for 24 hours as he was encouraged to start at lower rate (getting in about 5 cartons of nutrition).  Reports that he continues to give 279ml of water QID.  Reports had a hard day yesterday with nausea and vomiting.  He has not been able to eat anything orally for about 1 week due to nausea.  Reports today had ice chips and water during IV fluids and had nausea and vomiting during that.  Reports no diarrhea for the last 2 weeks.  Had normal bowel movement today.   Patient reports feels little bit better after getting fluids today  Medications: reviewed  Labs: reviewed  Anthropometrics:   Stable weight of 180 lb  Estimated Energy Needs  Kcals: 2050-2460 calories/d Protein: 102-123 g/d Fluid: 2.4 L/d  NUTRITION DIAGNOSIS: Inadequate oral intake continues   MALNUTRITION DIAGNOSIS: continue to monitor   INTERVENTION:   Recommend increasing vital 1.5 to 57ml/hr for 24 hours to better meet nutritional needs (6 cartons).  Need to consider adding promod 72ml if patient unable to take nutrition orally to better meet protein needs and no diarrhea recently.   Discussed with patient if could drink at least 1 ensure enlive or boost plus daily would better meet nutritional needs with current tube feeding regimen.  Tube feeding and oral supplement (1 daily) will provide 2395-3202 calories, 110-116 g/d.     MONITORING, EVALUATION, GOAL: weight trends, intake, TF   NEXT VISIT: phone call in 1 week  Marigene Erler B. Zenia Resides, [place], [place] Registered Dietitian [phone] (pager)

## 2017-03-15 ENCOUNTER — Encounter (HOSPITAL_COMMUNITY): Payer: Self-pay | Admitting: Radiology

## 2017-03-15 ENCOUNTER — Ambulatory Visit (HOSPITAL_COMMUNITY)
Admission: RE | Admit: 2017-03-15 | Discharge: 2017-03-15 | Disposition: A | Discharge status: Home / Self Care | Payer: Medicare Other | Source: Ambulatory Visit | Attending: Radiology | Admitting: Radiology

## 2017-03-15 ENCOUNTER — Other Ambulatory Visit (HOSPITAL_COMMUNITY): Payer: Self-pay | Admitting: Radiology

## 2017-03-15 ENCOUNTER — Telehealth: Payer: Self-pay

## 2017-03-15 DIAGNOSIS — K9419 Other complications of enterostomy: Secondary | ICD-10-CM | POA: Insufficient documentation

## 2017-03-15 DIAGNOSIS — R633 Feeding difficulties, unspecified: Secondary | ICD-10-CM

## 2017-03-15 HISTORY — PX: IR REPLACE G-TUBE SIMPLE WO FLUORO: IMG2323

## 2017-03-15 NOTE — Procedures (Signed)
Patient's pre-existing 6 French jejunostomy tube was upsized to new 60 French jejunostomy tube without immediate complications.  Balloon inflated with 6 cc normal saline.

## 2017-03-15 NOTE — Telephone Encounter (Signed)
Nutrition  Called for nutrition follow-up.  Left message on patient mobile number to call back.  Casey Simkin B. Zenia Resides, [place], [place] Registered Dietitian [phone] (pager)

## 2017-03-19 ENCOUNTER — Telehealth: Payer: Self-pay

## 2017-03-19 NOTE — Telephone Encounter (Addendum)
Nutrition Follow-up:  Patient returned RD's phone call.  Report that he is not doing well.  Reports that on 1/3 had to have J-tube replaced by IR at [facility] due previous tube placed on 12/24 at [facility].  Noted current tube 18 french J-tube, upsized from 85 [place].  Original tube 16 french red robinson catheter.    Patient reports no leaking around tube at present and currently working well.  Patient reports that he is currently getting in 4 cartons of vital 1.5 daily due to concerns of increased rate of tube feeding and feeding during the night may cause tube to malfunction.  Reports that he is giving 187ml water 4 times per day, was previously giving 240ml 4 times per day, unclear why dropped to 173ml QID.     Reports that he is able to drink about 8-10 oz of water daily and gets 1 ensure/boost down per day.  Has tried bites of yogurt, soup, ice cream, applesauce and reports these aren't working for him. Reports, "they just don't sit well." Asked if nauseated or vomited and reports no.  Reports does not really like the ensure/boost but getting it down.  Is not taking nausea medication currently.  Reports that he feels weak and feels like he needs more nutrition.    Anthropometrics:   No new weight   Estimated Energy Needs  Kcals: 2050-2460 calories/d Protein: 102-123 g/d Fluid: 2.4 L/d  NUTRITION DIAGNOSIS: Inadequate oral intake continues   MALNUTRITION DIAGNOSIS: continue to monitor   INTERVENTION:   Recommend vital 1.5, 5 cartons per day (23ml/hr for 24 hours) at this time to meet estimated nutrition needs with promod 60ml 2 times per day to meet protein needs. Tube feeding regimen will provide 2327 calories, 113 g of protein and 955ml free water (formula only).    Samples given to patient to try and written instructions and patient will pick up in clinic tomorrow at appointment as RD at another clinic on Tuesday.   If patient able to tolerate will order promod from  [facility].  Other option would be for vital 1.5, 6 cartons (44ml/hr for 24 hours) with ensure/boost plus daily to meet nutritional needs.  Reviewed water flush and provided written instructions.  Patient to drink 8 oz (247ml) daily orally (if unable to drink will need to give via tube).  Water flush with prostat will provide additional 328ml daily.  Can continue 139ml water flush 14ml QID at this time.  Will provide 1955ml water (including free water from formula).  Written instructions provided and patient to pick up on Tuesday. Encouraged patient to continue to drink ensure plus/boost plus at least 1-2 per day for added nutrition.  Will provide first complimentary case of ensure plus Tuesday at clinic visit.     MONITORING, EVALUATION, GOAL: weight trends, intake, TF   NEXT VISIT: phone call in 2 days  Caidence Kaseman B. Zenia Resides, [place], [place] Registered Dietitian [phone] (pager)

## 2017-03-19 NOTE — Telephone Encounter (Signed)
Nutrition  Called patient this am for nutrition follow-up.  Left message on voice mail (cell number).  Patient has previously requested I call his cell number.  Asked patient to call RD back for nutrition follow-up on tube feeding.  Eulas Schweitzer B. Zenia Resides, [place], [place] Registered Dietitian [phone] (pager)

## 2017-03-20 ENCOUNTER — Inpatient Hospital Stay: Payer: Medicare Other

## 2017-03-20 ENCOUNTER — Inpatient Hospital Stay: Payer: Medicare Other | Attending: Oncology

## 2017-03-20 ENCOUNTER — Encounter: Payer: Self-pay | Admitting: Oncology

## 2017-03-20 ENCOUNTER — Inpatient Hospital Stay (HOSPITAL_BASED_OUTPATIENT_CLINIC_OR_DEPARTMENT_OTHER): Payer: Medicare Other | Admitting: Oncology

## 2017-03-20 VITALS — BP 123/84 | HR 119 | Temp 97.4°F | Resp 18 | Wt 170.0 lb

## 2017-03-20 DIAGNOSIS — E860 Dehydration: Secondary | ICD-10-CM | POA: Diagnosis not present

## 2017-03-20 DIAGNOSIS — Z934 Other artificial openings of gastrointestinal tract status: Secondary | ICD-10-CM | POA: Diagnosis not present

## 2017-03-20 DIAGNOSIS — R1310 Dysphagia, unspecified: Secondary | ICD-10-CM | POA: Diagnosis not present

## 2017-03-20 DIAGNOSIS — Z923 Personal history of irradiation: Secondary | ICD-10-CM | POA: Diagnosis not present

## 2017-03-20 DIAGNOSIS — C155 Malignant neoplasm of lower third of esophagus: Secondary | ICD-10-CM | POA: Diagnosis not present

## 2017-03-20 DIAGNOSIS — Z87891 Personal history of nicotine dependence: Secondary | ICD-10-CM | POA: Diagnosis not present

## 2017-03-20 DIAGNOSIS — E46 Unspecified protein-calorie malnutrition: Secondary | ICD-10-CM | POA: Insufficient documentation

## 2017-03-20 DIAGNOSIS — Z95828 Presence of other vascular implants and grafts: Secondary | ICD-10-CM

## 2017-03-20 DIAGNOSIS — Z9221 Personal history of antineoplastic chemotherapy: Secondary | ICD-10-CM | POA: Insufficient documentation

## 2017-03-20 LAB — COMPREHENSIVE METABOLIC PANEL
ALBUMIN: 3.3 g/dL — AB (ref 3.5–5.0)
ALK PHOS: 125 U/L (ref 38–126)
ALT: 27 U/L (ref 17–63)
AST: 20 U/L (ref 15–41)
Anion gap: 10 (ref 5–15)
BUN: 13 mg/dL (ref 6–20)
CALCIUM: 8.8 mg/dL — AB (ref 8.9–10.3)
CO2: 24 mmol/L (ref 22–32)
CREATININE: 0.83 mg/dL (ref 0.61–1.24)
Chloride: 100 mmol/L — ABNORMAL LOW (ref 101–111)
GFR calc Af Amer: >60 mL/min (ref >60)
GFR calc non Af Amer: >60 mL/min (ref >60)
GLUCOSE: 109 mg/dL — AB (ref 65–99)
Potassium: 4.4 mmol/L (ref 3.5–5.1)
Sodium: 134 mmol/L — ABNORMAL LOW (ref 135–145)
Total Bilirubin: 1.2 mg/dL (ref 0.3–1.2)
Total Protein: 7 g/dL (ref 6.5–8.1)

## 2017-03-20 LAB — CBC WITH DIFFERENTIAL/PLATELET
BASOS PCT: 0 %
Basophils Absolute: 0 10*3/uL (ref 0–0.1)
EOS ABS: 0.1 10*3/uL (ref 0–0.7)
Eosinophils Relative: 2 %
HEMATOCRIT: 36 % — AB (ref 40.0–52.0)
Hemoglobin: 12.1 g/dL — ABNORMAL LOW (ref 13.0–18.0)
Lymphocytes Relative: 29 %
Lymphs Abs: 2.5 10*3/uL (ref 1.0–3.6)
MCH: 29.8 pg (ref 26.0–34.0)
MCHC: 33.5 g/dL (ref 32.0–36.0)
MCV: 88.9 fL (ref 80.0–100.0)
MONO ABS: 0.8 10*3/uL (ref 0.2–1.0)
MONOS PCT: 9 %
Neutro Abs: 5.2 10*3/uL (ref 1.4–6.5)
Neutrophils Relative %: 60 %
Platelets: 204 10*3/uL (ref 150–440)
RBC: 4.05 MIL/uL — ABNORMAL LOW (ref 4.40–5.90)
RDW: 18.9 % — AB (ref 11.5–14.5)
WBC: 8.7 10*3/uL (ref 3.8–10.6)

## 2017-03-20 MED ORDER — SODIUM CHLORIDE 0.9 % IV SOLN: 10.0000 mg | Freq: Once | INTRAVENOUS | Status: DC

## 2017-03-20 MED ORDER — SODIUM CHLORIDE 0.9% FLUSH
10.0000 mL | INTRAVENOUS | Status: DC | PRN
  Administered 2017-03-20: 10 mL via INTRAVENOUS
  Filled 2017-03-20: qty 10

## 2017-03-20 MED ORDER — DEXAMETHASONE SODIUM PHOSPHATE 10 MG/ML IJ SOLN
10.0000 mg | Freq: Once | INTRAMUSCULAR | Status: AC
  Administered 2017-03-20: 10 mg via INTRAVENOUS
  Filled 2017-03-20: qty 1

## 2017-03-20 MED ORDER — SODIUM CHLORIDE 0.9 % IV SOLN
Freq: Once | INTRAVENOUS | Status: AC
  Administered 2017-03-20: 11:00:00 via INTRAVENOUS
  Filled 2017-03-20: qty 1000

## 2017-03-20 MED ORDER — HEPARIN SOD (PORK) LOCK FLUSH 100 UNIT/ML IV SOLN
500.0000 [IU] | Freq: Once | INTRAVENOUS | Status: AC
  Administered 2017-03-20: 500 [IU] via INTRAVENOUS
  Filled 2017-03-20: qty 5

## 2017-03-20 NOTE — Progress Notes (Signed)
Hematology/Oncology Consult note [facility]  Telephone:([phone] [phone]  Patient Care Team: Idelle Crouch, MD as PCP - General (Internal Medicine) Clent Jacks, RN as Registered Nurse Grace Isaac, MD as Consulting Physician (Cardiothoracic Surgery) Sindy Guadeloupe, MD as Consulting Physician (Oncology) Noreene Filbert, MD as Referring Physician (Radiation Oncology)   Name of the patient: Casey Acevedo  [id number]  02/16/1941   Date of visit: 03/20/17  Diagnosis-non metastatic esophageal cancer [id number] [facility]   Chief complaint/ Reason for visit-routine f/u of esophageal cancer  J tube issues and dysphagia  Heme/Onc history:1. Patient is a 77 year old gentleman with no significant past medical history and takes no medications he has a remote history of smokingduring his college days and quit smoking thereafter. No history of any alcohol intake. He was recently admitted to the hospital on 12/06/2016 with symptoms of progressive dysphagia which he states has been going on since the starting of September. His dysphagia got to the point that he began to have pain even on swallowing icecream. He underwent EGD at that time which showed but high 2 cm long stricture 2 cm above the GE junction. Biopsies at that time were negative for malignancy. Given that he was unable to eat he was started on TPN at that time and plan was to get a repeat EGD with possible biopsy  2. Patient underwent repeat EGD on 12/26/2016 where he was again noted to have a lower esophageal stricture which reduced the lumen to less than 3-4 mm. There was no evidence of Barrett's esophagus. 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 Shortly after the stricture was dilated the stricture did close up significantly. Postprocedure patient complained of abdominal pain and  there was a concern for perforation and hence a repeat CT abdomen was obtained which did not show any evidence of perforation  3. CT abdomen pelvis as well as CT chest with contrast on 10/01/2018showed: 7 mm sub pleural nodule in the lingula. No evidence of axillary mediastinal or hilar adenopathy.1.3 cm soft tissue nodule adjacent to the GE junctionwhich is favored to represent an enlarged lymph node potentially active or metastatic in etiology. Narrowing involving thickening of the distal esophagus compatible with known distal esophageal stricture  4. He lives with his wife and is independent of his ADLs and IADLs. Besides dysphagia patient reports no loss of appetite or unintentional weight loss over the last few months. He denies any pain  5.[place] been deemed to be a potential surgical candidate in the future. Plan for now is to proceed with concurrent chemoradiation followed by EUS upon completion of treatment given difficulty with the  second EGD  6. Chemo/RT started on 01/19/17 and completed on 03/01/17   Interval history-patient has been able to swallow water and food like yogurt but has not been able to eat solid food patient had another episode of leakage around his J-tube and had a second replacement done about a week ago.  His J-tube feeds are not at goal as yet and he has lost about 10 pounds over the last 10 days.  He feels weak.  Denies any nausea  ECOG PS- 1 Pain scale- 0   Review of systems- Review of Systems  Constitutional: Positive for malaise/fatigue. Negative for chills, fever and weight loss.  HENT: Negative for congestion, ear  discharge and nosebleeds.   Eyes: Negative for blurred vision.  Respiratory: Negative for cough, hemoptysis, sputum production, shortness of breath and wheezing.   Cardiovascular: Negative for chest pain, palpitations, orthopnea and claudication.  Gastrointestinal:  Negative for abdominal pain, blood in stool, constipation, diarrhea, heartburn, melena, nausea and vomiting.  Genitourinary: Negative for dysuria, flank pain, frequency, hematuria and urgency.  Musculoskeletal: Negative for back pain, joint pain and myalgias.  Skin: Negative for rash.  Neurological: Negative for dizziness, tingling, focal weakness, seizures, weakness and headaches.  Endo/Heme/Allergies: Does not bruise/bleed easily.  Psychiatric/Behavioral: Negative for depression and suicidal ideas. The patient does not have insomnia.      No Known Allergies   Past Medical History:  Diagnosis Date  . Cancer (HCC)    esophageal  . Dysphagia   . GERD (gastroesophageal reflux disease)      Past Surgical History:  Procedure Laterality Date  . ABDOMINAL HYSTERECTOMY    . CHOLECYSTECTOMY    . ESOPHAGOGASTRODUODENOSCOPY (EGD) WITH PROPOFOL N/A 12/07/2016   Procedure: ESOPHAGOGASTRODUODENOSCOPY (EGD) WITH PROPOFOL;  Surgeon: Jonathon Bellows, MD;  Location: [facility] ENDOSCOPY;  Service: Gastroenterology;  Laterality: N/A;  . ESOPHAGOGASTRODUODENOSCOPY (EGD) WITH PROPOFOL N/A 12/26/2016   Procedure: ESOPHAGOGASTRODUODENOSCOPY (EGD) WITH PROPOFOL WITH DILATION;  Surgeon: Jonathon Bellows, MD;  Location: [facility] ENDOSCOPY;  Service: Gastroenterology;  Laterality: N/A;  . EYE SURGERY    . GASTROJEJUNOSTOMY N/A 01/16/2017   Procedure: LAPROSCOPIC ASSIST FEEDING JEJUNOSTOMY TUBE;  Surgeon: Johnathan Hausen, MD;  Location: [facility] ORS;  Service: General;  Laterality: N/A;  . IR FLUORO GUIDE PORT INSERTION RIGHT  01/10/2017  . IR REPLACE G-TUBE SIMPLE WO FLUORO  03/15/2017  . IR REPLC DUODEN/JEJUNO TUBE PERCUT W/FLUORO  03/05/2017  . PICC LINE INSERTION Right     Social History   Socioeconomic History  . Marital status: Married    Spouse name: Not on file  . Number of children: Not on file  . Years of education: Not on file  . Highest education level: Not on file  Social Needs  . Financial resource strain: Not  on file  . Food insecurity - worry: Not on file  . Food insecurity - inability: Not on file  . Transportation needs - medical: Not on file  . Transportation needs - non-medical: Not on file  Occupational History  . Not on file  Tobacco Use  . Smoking status: Never Smoker  . Smokeless tobacco: Never Used  Substance and Sexual Activity  . Alcohol use: No  . Drug use: No  . Sexual activity: Not Currently  Other Topics Concern  . Not on file  Social History Narrative   Independent at baseline. Ambulatory    Family History  Problem Relation Age of Onset  . Heart failure Father   . Prostate cancer Neg Hx   . Bladder Cancer Neg Hx   . Kidney cancer Neg Hx      Current Outpatient Medications:  .  Amino Acids-Protein Hydrolys (FEEDING SUPPLEMENT, PRO-STAT SUGAR FREE 64,) LIQD, Place 30 mLs into feeding tube daily., Disp: 900 mL, Rfl: 0 .  dexamethasone (DECADRON) 4 MG tablet, 8 mg by Per J Tube route daily. Start the day after chemo for two days , Disp: , Rfl:  .  hydrocortisone cream-nystatin cream-zinc oxide, Apply 1 application 4 (four) times daily topically., Disp: 4 oz, Rfl: 3 .  loperamide (IMODIUM) 2 MG capsule, Take 1 capsule (2 mg total) by mouth as needed for diarrhea or loose stools (take two tablests after the  first loose stool, then 1 tablet after each loose stool)., Disp: , Rfl:  .  morphine (ROXANOL) 20 MG/ML concentrated solution, Take 0.25 mLs (5 mg total) by mouth every 4 (four) hours as needed for severe pain., Disp: 30 mL, Rfl: 0 .  Nutritional Supplements (FEEDING SUPPLEMENT, VITAL 1.5 CAL,) LIQD, Give vital 1.5 via continuous pump at 82ml/hr for 22 hours.  Flush with 23ml of water before starting tube feeding and after stopping tube feeding.  Provide additional 225ml of water 2 other times during waking hours via J tube., Disp: 1540 mL, Rfl: 12 .  ondansetron (ZOFRAN-ODT) 4 MG disintegrating tablet, TAKE 1 TABLET BY MOUTH EVERY 8 HOURS AS NEEDED FOR NAUSEA AND  VOMITING, Disp: , Rfl: 0 .  prochlorperazine (COMPAZINE) 10 MG tablet, TAKE 1 TABLET (10 MG TOTAL) BY MOUTH EVERY 6 (SIX) HOURS AS NEEDED (NAUSEA OR VOMITING)., Disp: , Rfl: 1 No current facility-administered medications for this visit.   Facility-Administered Medications Ordered in Other Visits:  .  0.9 %  sodium chloride infusion, , Intravenous, Once, Sindy Guadeloupe, MD .  heparin lock flush 100 unit/mL, 500 Units, Intravenous, Once, Sindy Guadeloupe, MD .  ondansetron (ZOFRAN) 8 mg, dexamethasone (DECADRON) 10 mg in sodium chloride 0.9 % 50 mL IVPB, , Intravenous, Once, Sindy Guadeloupe, MD .  sodium chloride flush (NS) 0.9 % injection 10 mL, 10 mL, Intravenous, PRN, Sindy Guadeloupe, MD, 10 mL at 03/20/17 1120  Physical exam:  Vitals:   03/20/17 1028 03/20/17 1029  BP:  123/84  Pulse:  (!) 119  Resp:  18  Temp:  (!) 97.4 F (36.3 C)  TempSrc:  Tympanic  Weight: 180 lb (81.6 kg) 170 lb (77.1 kg)   Physical Exam  Constitutional: He is oriented to person, place, and time and well-developed, well-nourished, and in no distress.  HENT:  Head: Normocephalic and atraumatic.  Eyes: EOM are normal. Pupils are equal, round, and reactive to light.  Neck: Normal range of motion.  Cardiovascular: Regular rhythm and normal heart sounds.  tachycardic  Pulmonary/Chest: Effort normal and breath sounds normal.  Abdominal: Soft. Bowel sounds are normal.  J-tube in place and there is no leakage noted around the J-tube.  The skin around G-tube which was previously excoriated is healing well  Neurological: He is alert and oriented to person, place, and time.  Skin: Skin is warm and dry.     CMP Latest Ref Rng & Units 03/20/2017  Glucose 65 - 99 mg/dL 109(H)  BUN 6 - 20 mg/dL 13  Creatinine 0.61 - 1.24 mg/dL 0.83  Sodium 135 - 145 mmol/L 134(L)  Potassium 3.5 - 5.1 mmol/L 4.4  Chloride 101 - 111 mmol/L 100(L)  CO2 22 - 32 mmol/L 24  Calcium 8.9 - 10.3 mg/dL 8.8(L)  Total Protein 6.5 - 8.1 g/dL 7.0    Total Bilirubin 0.3 - 1.2 mg/dL 1.2  Alkaline Phos 38 - 126 U/L 125  AST 15 - 41 U/L 20  ALT 17 - 63 U/L 27   CBC Latest Ref Rng & Units 03/20/2017  WBC 3.8 - 10.6 K/uL 8.7  Hemoglobin 13.0 - 18.0 g/dL 12.1(L)  Hematocrit 40.0 - 52.0 % 36.0(L)  Platelets 150 - 440 K/uL 204    No images are attached to the encounter.  Ct Abdomen Pelvis W Contrast  Result Date: 02/25/2017 CLINICAL DATA:  Abdominal pain, fever.  Esophageal cancer EXAM: CT ABDOMEN AND PELVIS WITH CONTRAST TECHNIQUE: Multidetector CT imaging of the abdomen and  pelvis was performed using the standard protocol following bolus administration of intravenous contrast. CONTRAST:  137mL ISOVUE-300 IOPAMIDOL (ISOVUE-300) INJECTION 61% COMPARISON:  PET CT 01/03/2017 FINDINGS: Lower chest: Distal esophageal mass is again noted as seen on prior PET CT. Right base atelectasis. No effusions. Hepatobiliary: Small hypodensity in the left hepatic dome measures 10 mm, most compatible with small cysts. No other focal hepatic abnormality. Prior cholecystectomy. Pancreas: No focal abnormality or ductal dilatation. Spleen: Small calcifications throughout the spleen compatible with old granulomatous disease. Adrenals/Urinary Tract: No adrenal abnormality. No focal renal abnormality. No stones or hydronephrosis. Urinary bladder is unremarkable. Stomach/Bowel: Jejunostomy tube is in place. Stomach, large and small bowel grossly unremarkable. Vascular/Lymphatic: Aortic and iliac calcifications. No aneurysm or adenopathy. Reproductive: Mild prostate prominence. Other: No free fluid or free air. Musculoskeletal: No acute bony abnormality. IMPRESSION: Distal esophageal mass again noted, stable since prior PET CT. Right base atelectasis. No acute findings in the abdomen or pelvis. Electronically Signed   By: Rolm Baptise M.D.   On: 02/25/2017 19:34   Ir Replace G-tube Simple Wo Fluoro  Result Date: 03/15/2017 INDICATION: Patient with history of pre-existing 65  French jejunostomy tube; now with leakage at entry site; request received for tube change. EXAM: JEJUNOSTOMY TUBE EXCHANGE MEDICATIONS: None ANESTHESIA/SEDATION: None CONTRAST:  None FLUOROSCOPY TIME:  None COMPLICATIONS: None immediate. PROCEDURE: Informed consent was obtained from the patient after a thorough discussion of the procedural risks, benefits and alternatives. All questions were addressed. The pre-existing 71 French jejunostomy tube was removed and a new 2 French balloon retention jejunostomy tube was inserted without immediate complications. Balloon was inflated with 6 cc normal saline. Tube secured to skin site. IMPRESSION: Successful upsizing and exchange of existing 16 French jejunostomy tube with new 18 French jejunostomy tube. Read by: Rowe Robert, PA-C Electronically Signed   By: Aletta Edouard M.D.   On: 03/15/2017 15:18   Ir Replc Duoden/jejuno Tube Percut W/fluoro  Result Date: 03/05/2017 INDICATION: Leaking jejunostomy, accidentally removed EXAM: FLUOROSCOPIC REPLACEMENT OF THE 16 FRENCH JEJUNOSTOMY MEDICATIONS: NONE. ANESTHESIA/SEDATION: NONE. CONTRAST:  10 CC - administered into the gastric lumen. FLUOROSCOPY TIME:  Fluoroscopy Time:  18 seconds (3 mGy). COMPLICATIONS: None immediate. PROCEDURE: Informed written consent was obtained from the patient after a thorough discussion of the procedural risks, benefits and alternatives. All questions were addressed. Maximal Sterile Barrier Technique was utilized including caps, mask, sterile gowns, sterile gloves, sterile drape, hand hygiene and skin antiseptic. A timeout was performed prior to the initiation of the procedure. the existing left abdominal jejunostomy percutaneous tract was cannulated with a 16 french balloon retention catheter. balloon was inflated with 5 cc saline. contrast injection confirms position in the small bowel. catheter secured externally. no significant leakage demonstrated. feeding tube ready for use. IMPRESSION:  FLUOROSCOPIC REPLACEMENT OF THE 16 FRENCH JEJUNOSTOMY. Electronically Signed   By: Jerilynn Mages.  Shick M.D.   On: 03/05/2017 11:21     Assessment and plan- Patient is a 77 y.o. 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  He is scheduled for a repeat PET/CT scan on 04/13/2017.  If PET/CT scan does not show any evidence of metastatic disease, it remains to be seen if he would be a surgical candidate given his ongoing weakness, difficulty with G-tube feeds and inability to take oral feeds optimally.  If he is not deemed to be a surgical candidate then I would  favor monitoring him periodically with scans every 3-6 months.    I also discussed with Dr. Ardis Hughs the utility of doing a repeat EUS at this time.  He states that if an EUS is done very close to completion of radiation, he may not be able to get an accurate T stage.  Also if he does not have any evidence of metastatic disease on the PET scan, I do not think that EUS would change management at this time.  I will therefore hold off on getting an EUS and touch base with Dr. Servando Snare regarding this as well  With regards to poor oral intake and nutrition-I will get a barium swallow at this time to get an assessment of his pre-existing stricture.  If the stricture has improved significantly with chemoradiation; I would encourage the patient to take more oral feeds to reduce his dependence on J-tube feeds with the hope to get him off J-tube feeds in the future.  However, currently he is getting his entire nutrition through J-tube.  If patient is unable to swallow a few weeks from now and if he is eventually not deemed to be a surgical candidate-it may be worthwhile to get a G-tube placed instead which is easier to manage as compared to a J-tube  Patient will proceed with 1 L of IV fluids today along with 10 mg IV Decadron.  I will see  him back after his PET/CT scan with a CBC and CMP to discuss further management   Total face to face encounter time for this patient visit was 30 min. >50% of the time was  spent in counseling and coordination of care.       Visit Diagnosis 1. Malignant neoplasm of lower third of esophagus (HCC)   2. Protein-calorie malnutrition, unspecified severity ([place])   3. Jejunostomy tube in situ ([place])   4. Dehydration      Dr. Randa Evens, MD, MPH [facility] Pager- [phone] 03/20/2017 11:50 AM

## 2017-03-21 ENCOUNTER — Telehealth: Payer: Self-pay

## 2017-03-21 NOTE — Telephone Encounter (Signed)
Nutrition Follow-up:  Called patient for nutrition follow-up regarding new tube feeding regimen.  Patient received supplies in clinic on Tuesday.  Patient reports drinking about 8 oz maybe little more daily Reports was able to get 2 ensure plus down yesterday by mouth.   Only able to get 3.5-4 cans of vital 1.5 in per day as patient reports if rate goes higher than 28ml/hr tube will leak. Also reports if lays in bed even propped up on 3 pills tube will leak.  Reports bowel movements are normal to loose and having 1 per day.    Noted Dr. Janese Banks plan to have barium swallow study to evaluate stricture.     Medications: reviewed  Labs: reviewed  Anthropometrics:   Weight decreased to 170 lb from 180 lb  5% weight loss in 12 days, significant   Estimated Energy Needs  Kcals: 2050-2460 calories/d Protein: 102-123 g/d Fluid: 2.4 L/d  NUTRITION DIAGNOSIS: Inadequate oral intake continues   MALNUTRITION DIAGNOSIS: Patient now meets criteria for moderate malnutrition in acute illness as evidenced by 5% weight loss in 12 days and unable to take goal rate of tube feeding over the last 12 days due to tube malfunction   INTERVENTION:   Patient reports that he will try and stay upright for 16 hours (8am to midnight) to get more tube feeding, goal rate would be 83ml/hr for 16 hours to get 5 cartons of vital to better meet needs.   Reviewed pro-stat administration instructions and patient will plan to give prostat 69ml BID today.  May need to increase prostat if unable to deliver goal rate of tube feeding.   Reviewed water flush and encouraged patient to let RD know if consuming more fluids orally as we will need to adjust water flush  Encouraged patient to continue with drinking ensure plus-boost plus at least 1-2 daily, more if he can do it.  Continue to encourage patient to try more foods orally.      MONITORING, EVALUATION, GOAL: weight trends, intake, TF   NEXT VISIT: phone call in 1-2  days  Ernestine Rohman B. Zenia Resides, [place], [place] Registered Dietitian [phone] (pager)

## 2017-03-29 ENCOUNTER — Ambulatory Visit
Admission: RE | Admit: 2017-03-29 | Discharge: 2017-03-29 | Disposition: A | Discharge status: Home / Self Care | Payer: Medicare Other | Source: Ambulatory Visit | Attending: Oncology | Admitting: Oncology

## 2017-03-29 ENCOUNTER — Telehealth: Payer: Self-pay

## 2017-03-29 DIAGNOSIS — C155 Malignant neoplasm of lower third of esophagus: Secondary | ICD-10-CM | POA: Diagnosis present

## 2017-03-29 DIAGNOSIS — K222 Esophageal obstruction: Secondary | ICD-10-CM | POA: Diagnosis not present

## 2017-03-29 IMAGING — RF DG ESOPHAGUS
7 series · 12 of 12 positions shown · non-contrast
Comparison: PET-CT study [DATE] and barium swallow
examination [DATE].

CLINICAL DATA: History of esophageal malignancy status post
chemotherapy and radiation therapy. Patient has been unable to eat
solid food since [DATE] and only small amounts of thin liquids
passed. The patient has vomiting if eating or drinking more than a
small amount of thin liquid. Patient uses a G-tube for nurse mint
and medication administration.

EXAM:
ESOPHOGRAM/BARIUM SWALLOW
TECHNIQUE: Single contrast examination was performed using thin barium or water
soluble.
FLUOROSCOPY TIME:  Fluoroscopy Time:  30 seconds
Radiation Exposure Index (if provided by the fluoroscopic device):
873 micro Gy per meters square
Number of Acquired Spot Images: 5+ 2 video loops

[Series 1: fluoro_barium 2fps_bw · 0.17mm/px · 4 of 20 frames shown (1 of 7)]
[frame 4/20]
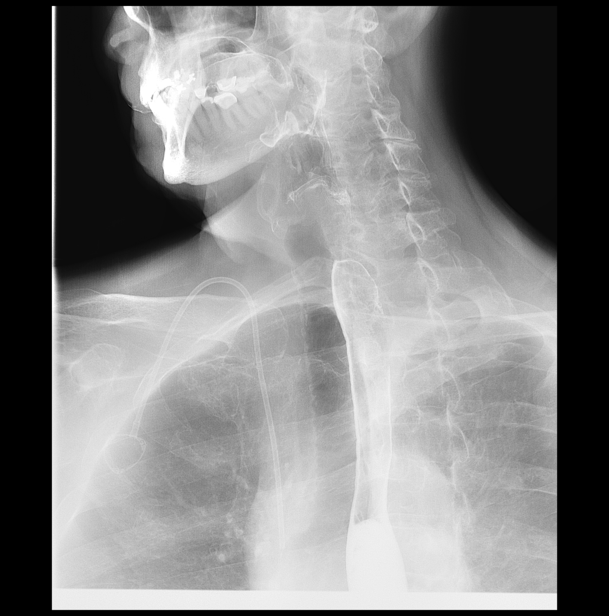
[frame 11/20]
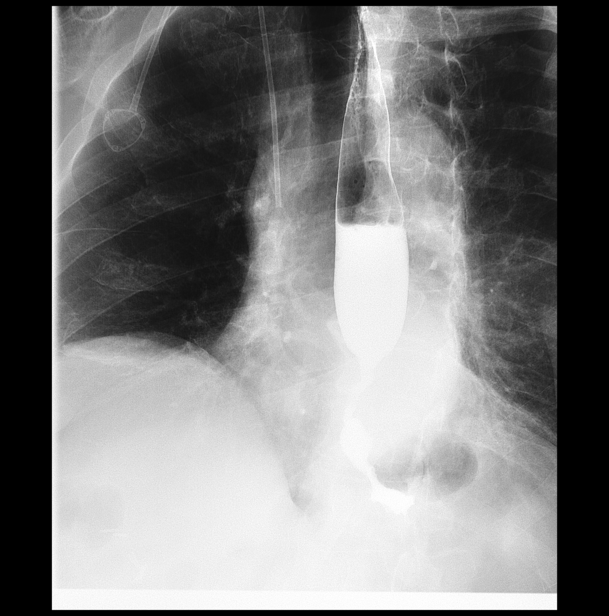
[frame 15/20]
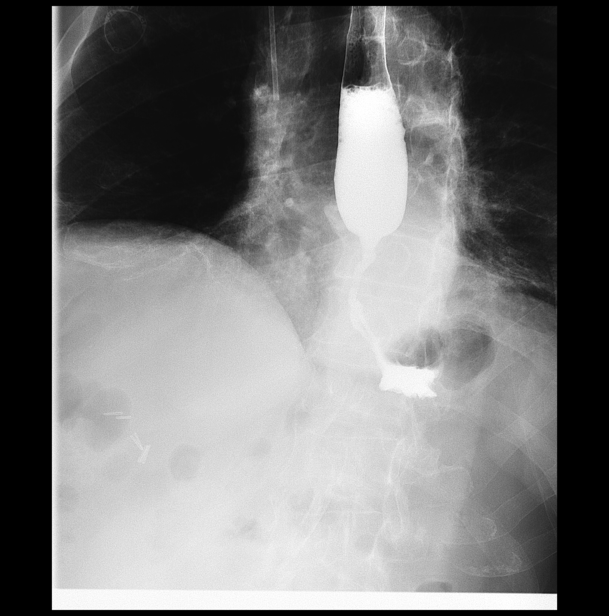
[frame 18/20]
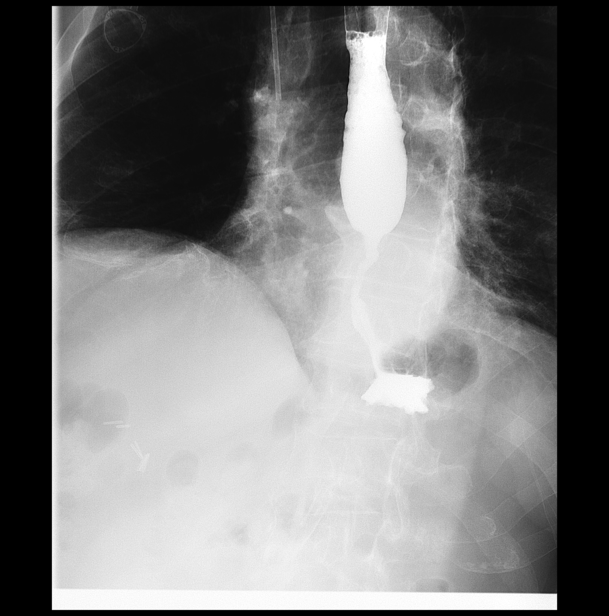

[Series 2: fluoro_barium 2fps_bw · 0.17mm/px · 1 of 1 slices shown (2 of 7)]
[im 1/1]
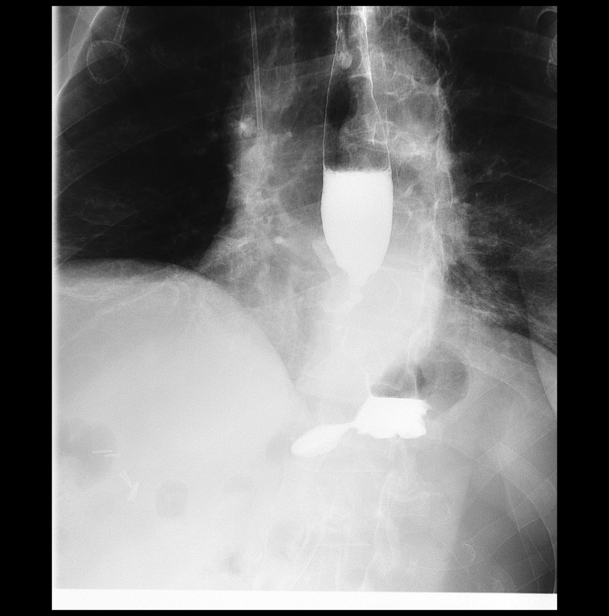

[Series 3: fluoro_barium 2fps_bw · 0.17mm/px · 1 of 1 slices shown (3 of 7)]
[im 1/1]
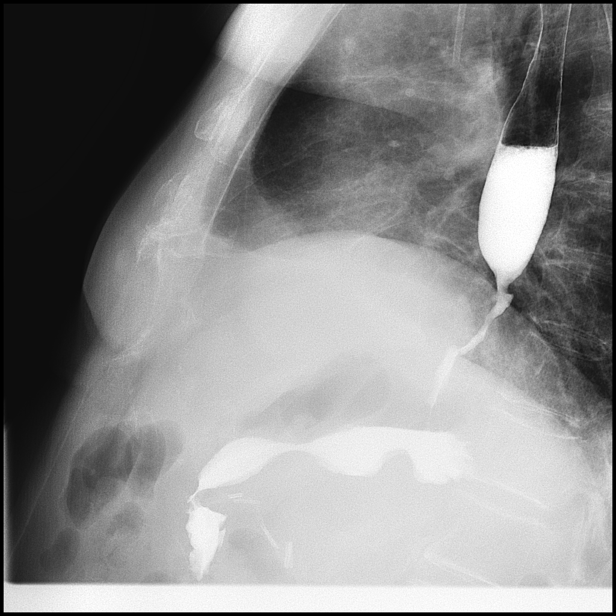

[Series 4: fluoro_barium 2fps_bw · 0.18mm/px · 1 of 1 slices shown (4 of 7)]
[im 1/1]
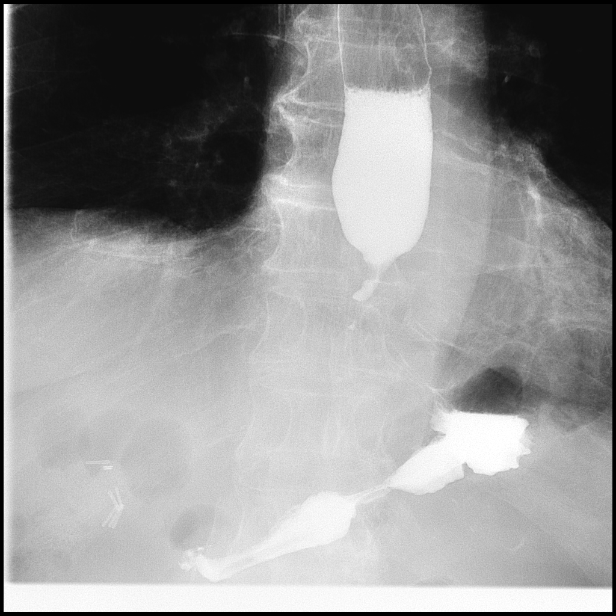

[Series 5: fluoro_barium 2fps_bw · 0.18mm/px · 1 of 1 slices shown (5 of 7)]
[im 1/1]
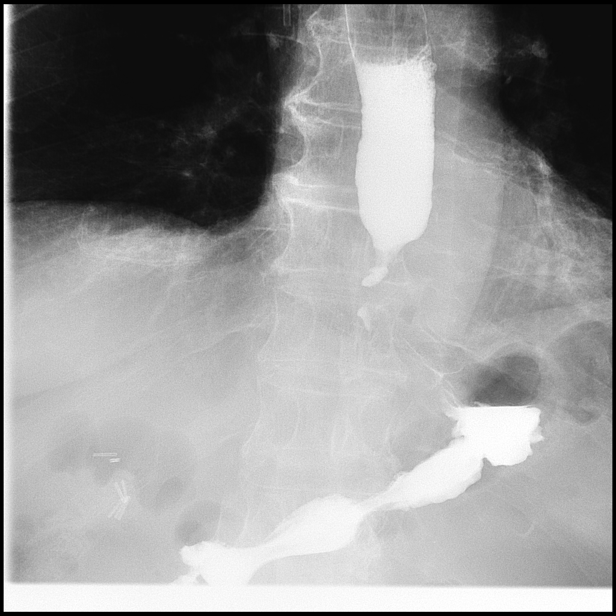

[Series 6: fluoro_barium 2fps_bw · 0.18mm/px · 3 of 15 frames shown (6 of 7)]
[frame 3/15]
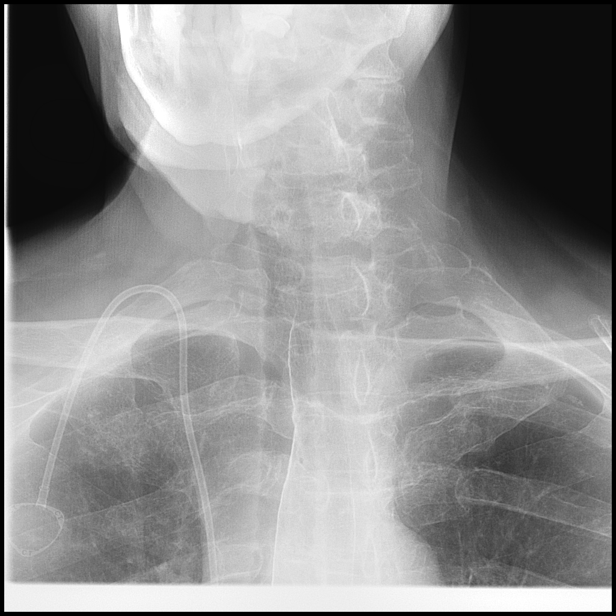
[frame 8/15]
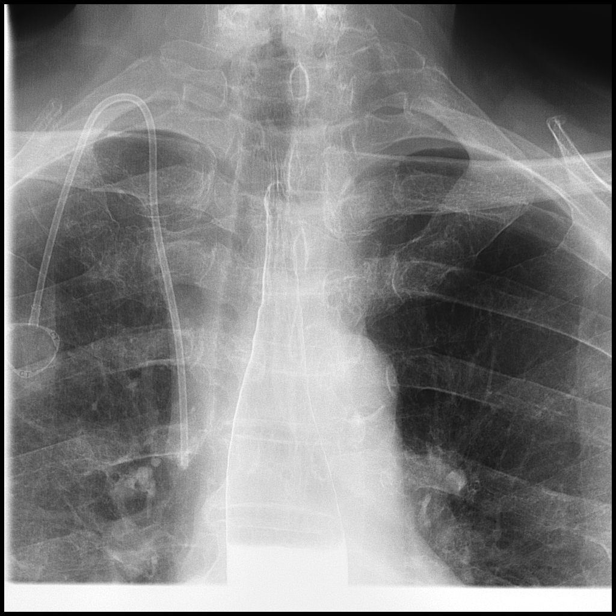
[frame 13/15]
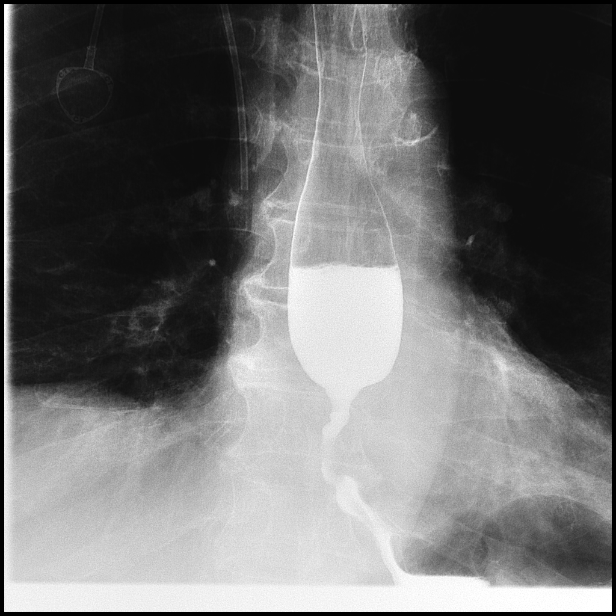

[Series 7: fluoro_barium 2fps_bw · 0.18mm/px · 1 of 1 slices shown (7 of 7)]
[im 1/1]
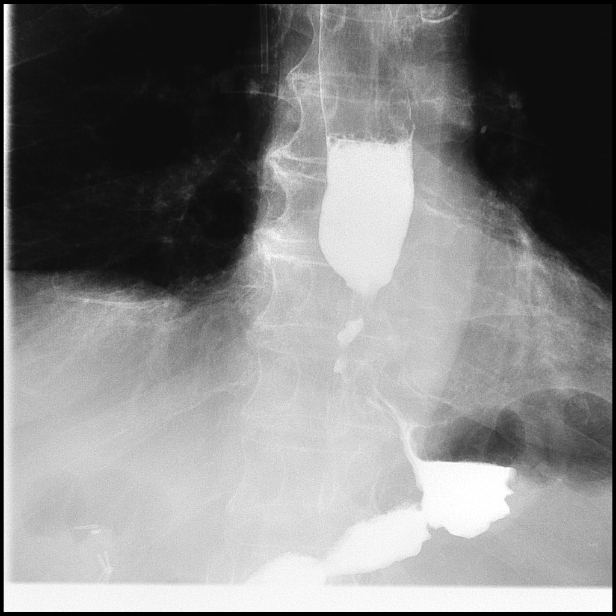

[12 of 12 positions shown; findings below may reference images not displayed]

FINDINGS: The patient ingested a small amount of thin barium. The hypopharynx
distended well. The cervical esophagus also distended well. The
proximal and middle thirds of the esophagus distended well.
Approximately 7 cm above the GE junction abrupt long segment
tapering of the caliber of the distal esophagus to less than 5 mm
was observed. This only transiently relax but never exceeded
approximately 3 mm in diameter. The mid esophagus just proximal to
the transition point was mildly dilated.
IMPRESSION: High-grade long segment stricture of the distal esophagus which only
gradually allows passage of liquid barium. More proximally no
abnormality in the esophagus is seen.

## 2017-03-29 NOTE — Telephone Encounter (Signed)
Nutrition Follow-up:  Spoke with patient via phone for nutrition follow-up.    Patient had barium swallow today.  Reports he was told cancer has not shrunk.  Patient will see Dr. Janese Banks tomorrow.    Reports he is able to drink about 8 oz of water and 2 ensure plus per day.    Reports that he was not able to tolerate the prostat 51mlBID due to diarrhea. Reports he has stopped the prostat and diarrhea has resolved.    Report that he is getting 4.5 cartons of vital 1.5 in daily via tube feeding (8am to midnight). Reports flushing tube with 164ml of water when starts tube 8am, at noon, 3pm and midnight.  If gives more water at one time, leaks out of tube.    No other nutrition impact symptoms mentioned  Medications: reviewed  Labs: reviewed  Anthropometrics:   No new weight   Estimated Energy Needs  Kcals: 2050-2460 calories/d Protein: 102-123 g/d Fluid: 2.4 L/d  NUTRITION DIAGNOSIS: Inadequate oral intake continues   MALNUTRITION DIAGNOSIS: continue to montior   INTERVENTION:   Patient unable to tolerate prostat/promod supplement due to diarrhea. Recommend patient continue vital 1.5 4.5 cartons per day via pump.   Flush tube with water 163ml 5 times per day vs 4 times per day  Continue to drink 2 ensure plus per day for additional calories and 8 oz of water. Patient receiving 2255 calories, 98 g of protein and 2063ml (free water with formula and ensure plus, oral water and water flushes)     MONITORING, EVALUATION, GOAL: weight trends, intake, TF   NEXT VISIT: phone follow-up  Nou Chard B. Zenia Resides, [place], [place] Registered Dietitian [phone] (pager)

## 2017-03-30 ENCOUNTER — Inpatient Hospital Stay (HOSPITAL_BASED_OUTPATIENT_CLINIC_OR_DEPARTMENT_OTHER): Payer: Medicare Other | Admitting: Oncology

## 2017-03-30 ENCOUNTER — Inpatient Hospital Stay: Payer: Medicare Other

## 2017-03-30 VITALS — BP 120/78 | HR 108 | Temp 98.3°F | Resp 18 | Wt 171.8 lb

## 2017-03-30 DIAGNOSIS — R1310 Dysphagia, unspecified: Secondary | ICD-10-CM

## 2017-03-30 DIAGNOSIS — C155 Malignant neoplasm of lower third of esophagus: Secondary | ICD-10-CM

## 2017-03-30 DIAGNOSIS — E860 Dehydration: Secondary | ICD-10-CM | POA: Diagnosis not present

## 2017-03-30 LAB — COMPREHENSIVE METABOLIC PANEL
ALT: 39 U/L (ref 17–63)
AST: 24 U/L (ref 15–41)
Albumin: 3.3 g/dL — ABNORMAL LOW (ref 3.5–5.0)
Alkaline Phosphatase: 128 U/L — ABNORMAL HIGH (ref 38–126)
Anion gap: 8 (ref 5–15)
BUN: 17 mg/dL (ref 6–20)
CHLORIDE: 100 mmol/L — AB (ref 101–111)
CO2: 25 mmol/L (ref 22–32)
Calcium: 8.5 mg/dL — ABNORMAL LOW (ref 8.9–10.3)
Creatinine, Ser: 0.81 mg/dL (ref 0.61–1.24)
GFR calc Af Amer: >60 mL/min (ref >60)
Glucose, Bld: 126 mg/dL — ABNORMAL HIGH (ref 65–99)
POTASSIUM: 4.3 mmol/L (ref 3.5–5.1)
SODIUM: 133 mmol/L — AB (ref 135–145)
Total Bilirubin: 0.7 mg/dL (ref 0.3–1.2)
Total Protein: 6.8 g/dL (ref 6.5–8.1)

## 2017-03-30 LAB — CBC WITH DIFFERENTIAL/PLATELET
BASOS ABS: 0 10*3/uL (ref 0–0.1)
BASOS PCT: 0 %
Eosinophils Absolute: 0.3 10*3/uL (ref 0–0.7)
Eosinophils Relative: 3 %
HEMATOCRIT: 36.9 % — AB (ref 40.0–52.0)
HEMOGLOBIN: 12.2 g/dL — AB (ref 13.0–18.0)
Lymphocytes Relative: 27 %
Lymphs Abs: 2.1 10*3/uL (ref 1.0–3.6)
MCH: 29.9 pg (ref 26.0–34.0)
MCHC: 33 g/dL (ref 32.0–36.0)
MCV: 90.7 fL (ref 80.0–100.0)
MONOS PCT: 14 %
Monocytes Absolute: 1.1 10*3/uL — ABNORMAL HIGH (ref 0.2–1.0)
NEUTROS ABS: 4.4 10*3/uL (ref 1.4–6.5)
NEUTROS PCT: 56 %
Platelets: 281 10*3/uL (ref 150–440)
RBC: 4.07 MIL/uL — ABNORMAL LOW (ref 4.40–5.90)
RDW: 19.6 % — ABNORMAL HIGH (ref 11.5–14.5)
WBC: 7.8 10*3/uL (ref 3.8–10.6)

## 2017-04-02 ENCOUNTER — Encounter: Payer: Self-pay | Admitting: Oncology

## 2017-04-02 NOTE — Progress Notes (Signed)
Hematology/Oncology Consult note [facility]  [phone] [phone]  Patient Care Team: Idelle Crouch, MD as PCP - General (Internal Medicine) Clent Jacks, RN as Registered Nurse Grace Isaac, MD as Consulting Physician (Cardiothoracic Surgery) Sindy Guadeloupe, MD as Consulting Physician (Oncology) Noreene Filbert, MD as Referring Physician (Radiation Oncology)   Name of the patient: Casey Acevedo  [id number]  12/06/1940   Date of visit: 04/02/17  Diagnosis-non metastatic esophageal cancer [id number] [facility]   Chief complaint/ Reason for visit-routine f/u of esophageal cancer  J tube issues and dysphagia  Heme/Onc history:1. Patient is a 77 year old gentleman with no significant past medical history and takes no medications he has a remote history of smokingduring his college days and quit smoking thereafter. No history of any alcohol intake. He was recently admitted to the hospital on 12/06/2016 with symptoms of progressive dysphagia which he states has been going on since the starting of September. His dysphagia got to the point that he began to have pain even on swallowing icecream. He underwent EGD at that time which showed but high 2 cm long stricture 2 cm above the GE junction. Biopsies at that time were negative for malignancy. Given that he was unable to eat he was started on TPN at that time and plan was to get a repeat EGD with possible biopsy  2. Patient underwent repeat EGD on 12/26/2016 where he was again noted to have a lower esophageal stricture which reduced the lumen to less than 3-4 mm. There was no evidence of Barrett's esophagus. 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 Shortly after the stricture was dilated the stricture did close up significantly. Postprocedure patient complained of abdominal pain and  there was a concern for perforation and hence a repeat CT abdomen was obtained which did not show any evidence of perforation  3. CT abdomen pelvis as well as CT chest with contrast on 10/01/2018showed: 7 mm sub pleural nodule in the lingula. No evidence of axillary mediastinal or hilar adenopathy.1.3 cm soft tissue nodule adjacent to the GE junctionwhich is favored to represent an enlarged lymph node potentially active or metastatic in etiology. Narrowing involving thickening of the distal esophagus compatible with known distal esophageal stricture  4. He lives with his wife and is independent of his ADLs and IADLs. Besides dysphagia patient reports no loss of appetite or unintentional weight loss over the last few months. He denies any pain  5.[place] been deemed to be a potential surgical candidate in the future. Plan for now is to proceed with concurrent chemoradiation followed by EUS upon completion of treatment given difficulty with the  second EGD  6. Chemo/RT started on 01/19/17 and completed on 03/01/17    Interval history- Patient is only able to swallow some liquids but unable to eat solid food. J tube is functioning well and there is no leakage  ECOG PS- 1 Pain scale- 0 Opioid associated constipation- no  Review of systems- Review of Systems  Constitutional: Positive for malaise/fatigue. Negative for chills, fever and weight loss.  HENT: Negative for congestion, ear discharge and nosebleeds.   Eyes: Negative for blurred vision.  Respiratory: Negative for cough, hemoptysis, sputum production, shortness of breath and wheezing.   Cardiovascular: Negative for chest pain, palpitations, orthopnea and claudication.  Gastrointestinal: Negative for abdominal pain,  blood in stool, constipation, diarrhea, heartburn, melena, nausea and vomiting.  Genitourinary: Negative for dysuria, flank pain, frequency,  hematuria and urgency.  Musculoskeletal: Negative for back pain, joint pain and myalgias.  Skin: Negative for rash.  Neurological: Negative for dizziness, tingling, focal weakness, seizures, weakness and headaches.  Endo/Heme/Allergies: Does not bruise/bleed easily.  Psychiatric/Behavioral: Negative for depression and suicidal ideas. The patient does not have insomnia.       No Known Allergies   Past Medical History:  Diagnosis Date  . Cancer (HCC)    esophageal  . Dysphagia   . GERD (gastroesophageal reflux disease)      Past Surgical History:  Procedure Laterality Date  . ABDOMINAL HYSTERECTOMY    . CHOLECYSTECTOMY    . ESOPHAGOGASTRODUODENOSCOPY (EGD) WITH PROPOFOL N/A 12/07/2016   Procedure: ESOPHAGOGASTRODUODENOSCOPY (EGD) WITH PROPOFOL;  Surgeon: Jonathon Bellows, MD;  Location: [facility] ENDOSCOPY;  Service: Gastroenterology;  Laterality: N/A;  . ESOPHAGOGASTRODUODENOSCOPY (EGD) WITH PROPOFOL N/A 12/26/2016   Procedure: ESOPHAGOGASTRODUODENOSCOPY (EGD) WITH PROPOFOL WITH DILATION;  Surgeon: Jonathon Bellows, MD;  Location: [facility] ENDOSCOPY;  Service: Gastroenterology;  Laterality: N/A;  . EYE SURGERY    . GASTROJEJUNOSTOMY N/A 01/16/2017   Procedure: LAPROSCOPIC ASSIST FEEDING JEJUNOSTOMY TUBE;  Surgeon: Johnathan Hausen, MD;  Location: [facility] ORS;  Service: General;  Laterality: N/A;  . IR FLUORO GUIDE PORT INSERTION RIGHT  01/10/2017  . IR REPLACE G-TUBE SIMPLE WO FLUORO  03/15/2017  . IR REPLC DUODEN/JEJUNO TUBE PERCUT W/FLUORO  03/05/2017  . PICC LINE INSERTION Right     Social History   Socioeconomic History  . Marital status: Married    Spouse name: Not on file  . Number of children: Not on file  . Years of education: Not on file  . Highest education level: Not on file  Social Needs  . Financial resource strain: Not on file  . Food insecurity - worry: Not on file  . Food insecurity - inability: Not on file  . Transportation needs - medical: Not on file  . Transportation needs -  non-medical: Not on file  Occupational History  . Not on file  Tobacco Use  . Smoking status: Never Smoker  . Smokeless tobacco: Never Used  Substance and Sexual Activity  . Alcohol use: No  . Drug use: No  . Sexual activity: Not Currently  Other Topics Concern  . Not on file  Social History Narrative   Independent at baseline. Ambulatory    Family History  Problem Relation Age of Onset  . Heart failure Father   . Prostate cancer Neg Hx   . Bladder Cancer Neg Hx   . Kidney cancer Neg Hx      Current Outpatient Medications:  .  Amino Acids-Protein Hydrolys (FEEDING SUPPLEMENT, PRO-STAT SUGAR FREE 64,) LIQD, Place 30 mLs into feeding tube daily., Disp: 900 mL, Rfl: 0 .  dexamethasone (DECADRON) 4 MG tablet, 8 mg by Per J Tube route daily. Start the day after chemo for two days , Disp: , Rfl:  .  hydrocortisone cream-nystatin cream-zinc oxide, Apply 1 application 4 (four) times daily topically., Disp: 4 oz, Rfl: 3 .  loperamide (IMODIUM) 2 MG capsule, Take 1 capsule (2 mg total) by mouth as needed for diarrhea or loose stools (take two tablests after the first loose stool, then 1 tablet after each loose stool)., Disp: , Rfl:  .  morphine (ROXANOL) 20 MG/ML concentrated solution, Take 0.25 mLs (5 mg total) by mouth every 4 (four) hours as needed for severe pain.,  Disp: 30 mL, Rfl: 0 .  Nutritional Supplements (FEEDING SUPPLEMENT, VITAL 1.5 CAL,) LIQD, Give vital 1.5 via continuous pump at 37ml/hr for 22 hours.  Flush with 284ml of water before starting tube feeding and after stopping tube feeding.  Provide additional 266ml of water 2 other times during waking hours via J tube., Disp: 1540 mL, Rfl: 12 .  ondansetron (ZOFRAN-ODT) 4 MG disintegrating tablet, TAKE 1 TABLET BY MOUTH EVERY 8 HOURS AS NEEDED FOR NAUSEA AND VOMITING, Disp: , Rfl: 0 .  prochlorperazine (COMPAZINE) 10 MG tablet, TAKE 1 TABLET (10 MG TOTAL) BY MOUTH EVERY 6 (SIX) HOURS AS NEEDED (NAUSEA OR VOMITING)., Disp: , Rfl:  1 No current facility-administered medications for this visit.   Facility-Administered Medications Ordered in Other Visits:  .  0.9 %  sodium chloride infusion, , Intravenous, Once, Sindy Guadeloupe, MD .  ondansetron (ZOFRAN) 8 mg, dexamethasone (DECADRON) 10 mg in sodium chloride 0.9 % 50 mL IVPB, , Intravenous, Once, Sindy Guadeloupe, MD  Physical exam:  Vitals:   03/30/17 1426  BP: 120/78  Pulse: (!) 108  Resp: 18  Temp: 98.3 F (36.8 C)  TempSrc: Tympanic  Weight: 171 lb 12.8 oz (77.9 kg)   Physical Exam  Constitutional: He is oriented to person, place, and time and well-developed, well-nourished, and in no distress.  HENT:  Head: Normocephalic and atraumatic.  Eyes: EOM are normal. Pupils are equal, round, and reactive to light.  Neck: Normal range of motion.  Cardiovascular: Normal rate, regular rhythm and normal heart sounds.  Pulmonary/Chest: Effort normal and breath sounds normal.  Abdominal: Soft. Bowel sounds are normal.  J tube in place. It is in position and there is no leakage around the tube  Neurological: He is alert and oriented to person, place, and time.  Skin: Skin is warm and dry.     CMP Latest Ref Rng & Units 03/30/2017  Glucose 65 - 99 mg/dL 126(H)  BUN 6 - 20 mg/dL 17  Creatinine 0.61 - 1.24 mg/dL 0.81  Sodium 135 - 145 mmol/L 133(L)  Potassium 3.5 - 5.1 mmol/L 4.3  Chloride 101 - 111 mmol/L 100(L)  CO2 22 - 32 mmol/L 25  Calcium 8.9 - 10.3 mg/dL 8.5(L)  Total Protein 6.5 - 8.1 g/dL 6.8  Total Bilirubin 0.3 - 1.2 mg/dL 0.7  Alkaline Phos 38 - 126 U/L 128(H)  AST 15 - 41 U/L 24  ALT 17 - 63 U/L 39   CBC Latest Ref Rng & Units 03/30/2017  WBC 3.8 - 10.6 K/uL 7.8  Hemoglobin 13.0 - 18.0 g/dL 12.2(L)  Hematocrit 40.0 - 52.0 % 36.9(L)  Platelets 150 - 440 K/uL 281    No images are attached to the encounter.  Dg Esophagus  Result Date: 03/29/2017 CLINICAL DATA:  History of esophageal malignancy status post chemotherapy and radiation therapy.  Patient has been unable to eat solid food since October 2018 and only small amounts of thin liquids passed. The patient has vomiting if eating or drinking more than a small amount of thin liquid. Patient uses a G-tube for nurse mint and medication administration. EXAM: ESOPHOGRAM/BARIUM SWALLOW TECHNIQUE: Single contrast examination was performed using thin barium or water soluble. FLUOROSCOPY TIME:  Fluoroscopy Time:  30 seconds Radiation Exposure Index (if provided by the fluoroscopic device): 873 micro Gy per meters square Number of Acquired Spot Images: 5+ 2 video loops COMPARISON:  PET-CT study of October 24th 2018 and barium swallow examination of December 26, 2016. FINDINGS: The patient  ingested a small amount of thin barium. The hypopharynx distended well. The cervical esophagus also distended well. The proximal and middle thirds of the esophagus distended well. Approximately 7 cm above the GE junction abrupt long segment tapering of the caliber of the distal esophagus to less than 5 mm was observed. This only transiently relax but never exceeded approximately 3 mm in diameter. The mid esophagus just proximal to the transition point was mildly dilated. 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 More proximally no abnormality in the esophagus is seen. Electronically Signed   By: David  Martinique M.D.   On: 03/29/2017 10:14   Ir Replace G-tube Simple Wo Fluoro  Result Date: 03/15/2017 INDICATION: Patient with history of pre-existing 8 French jejunostomy tube; now with leakage at entry site; request received for tube change. EXAM: JEJUNOSTOMY TUBE EXCHANGE MEDICATIONS: None ANESTHESIA/SEDATION: None CONTRAST:  None FLUOROSCOPY TIME:  None COMPLICATIONS: None immediate. PROCEDURE: Informed consent was obtained from the patient after a thorough discussion of the procedural risks, benefits and alternatives. All questions were  addressed. The pre-existing 46 French jejunostomy tube was removed and a new 16 French balloon retention jejunostomy tube was inserted without immediate complications. Balloon was inflated with 6 cc normal saline. Tube secured to skin site. IMPRESSION: Successful upsizing and exchange of existing 16 French jejunostomy tube with new 18 French jejunostomy tube. Read by: Rowe Robert, PA-C Electronically Signed   By: Aletta Edouard M.D.   On: 03/15/2017 15:18   Ir Replc Duoden/jejuno Tube Percut W/fluoro  Result Date: 03/05/2017 INDICATION: Leaking jejunostomy, accidentally removed EXAM: FLUOROSCOPIC REPLACEMENT OF THE 16 FRENCH JEJUNOSTOMY MEDICATIONS: NONE. ANESTHESIA/SEDATION: NONE. CONTRAST:  10 CC - administered into the gastric lumen. FLUOROSCOPY TIME:  Fluoroscopy Time:  18 seconds (3 mGy). COMPLICATIONS: None immediate. PROCEDURE: Informed written consent was obtained from the patient after a thorough discussion of the procedural risks, benefits and alternatives. All questions were addressed. Maximal Sterile Barrier Technique was utilized including caps, mask, sterile gowns, sterile gloves, sterile drape, hand hygiene and skin antiseptic. A timeout was performed prior to the initiation of the procedure. the existing left abdominal jejunostomy percutaneous tract was cannulated with a 16 french balloon retention catheter. balloon was inflated with 5 cc saline. contrast injection confirms position in the small bowel. catheter secured externally. no significant leakage demonstrated. feeding tube ready for use. IMPRESSION: FLUOROSCOPIC REPLACEMENT OF THE 16 FRENCH JEJUNOSTOMY. Electronically Signed   By: Jerilynn Mages.  Shick M.D.   On: 03/05/2017 11:21     Assessment and plan- Patient is a 77 y.o. male non metastatic esophageal cancer [id number] SCC status post 6 cycles of carbotaxol  Patient has completed chemo/RT. However barium swallow still shows a persistent stricture and there is hardly any opening despite  completion of treatment. At this point I will await PET/Ct scan to see if there is persistent uptake at the site of stricture. If PET/Ct does not show any evidence of distant metastatic disease, options at this time would be:  1. Proceed with definitive surgery if he is still a surgical candidate  2. If he is not deemed to be a surgical candidate, I will discuss his case at tumor board after PET. I would favor watchful monitoring with scans every 3 montsh andtreat at progression. We could potentially switch his feeding to PEG tube instead of J tube in that case  I will discuss his case with Dr. Servando Snare after PET  I will see him  back after PET scan and appointment with thoracic surgery     Visit Diagnosis 1. Dysphagia, unspecified type   2. Malignant neoplasm of lower third of esophagus (HCC)      Dr. Randa Evens, MD, MPH [facility] Pager- [phone] 04/02/2017 7:55 AM

## 2017-04-05 ENCOUNTER — Ambulatory Visit (INDEPENDENT_AMBULATORY_CARE_PROVIDER_SITE_OTHER): Payer: Medicare Other | Admitting: Cardiothoracic Surgery

## 2017-04-05 VITALS — BP 111/75 | HR 110 | Resp 20 | Ht 69.0 in | Wt 173.0 lb

## 2017-04-05 DIAGNOSIS — C155 Malignant neoplasm of lower third of esophagus: Secondary | ICD-10-CM

## 2017-04-05 NOTE — Progress Notes (Signed)
[address]       [place] [address]             [phone]                    Joandry D Monje [place] Medical Record #[id number] Date of Birth: 02-28-1941  Referring: Berton Lan, MD Primary Care: Idelle Crouch, MD  Chief Complaint: Advanced stage esophageal cancer  History of Present Illness:    ULUS HAZEN 77 y.o. male has been followed for diagnosis of esophageal cancer.  The patient had  3 month history of dysphagia starting in the late summer .  He underwent endoscopy at the end of September with negative biopsy for malignancy .   December 26 2016 a repeat endoscopy with dilatation was performed .  The patient notes severe chest pain following the dilatation   Repeat scans showed no evidence of perforation.  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  Treated with PICC and TPN.    Patient denies any lifelong problem with reflux, he does note that his 81 year old brother is alive with history of Barrett's Previous surgery includes only laparoscopic cholecystectomy in 2009  Patient has very few in the way of long-term medical problems, notes he takes no prescription medications, has had no previous cardiac history. He smoked briefly while in college but none for more than 50 years.  Denies any alcohol intake for at least the last 25 years.  He is better able to maintain his feeding jejunostomy tube now but is no longer leaking around the tube and skin changes have much improved, is  Patient completed course of chemotherapy December 20, he completed radiation December 24  Patient had a recent follow-up Gastrografin swallow last week with continued narrowing in distal esophagus, the patient is able to only take liquids currently  Current Activity/ Functional Status:  Patient is independent with mobility/ambulation, transfers, ADL's, IADL's.   Zubrod Score: At the time of surgery this patient's most appropriate  activity status/level should be described as: []     0    Normal activity, no symptoms []     1    Restricted in physical strenuous activity but ambulatory, able to do out light work [x]     2    Ambulatory and capable of self care, unable to do work activities, up and about               >50 % of waking hours                              []     3    Only limited self care, in bed greater than 50% of waking hours []     4    Completely disabled, no self care, confined to bed or chair []     5    Moribund   Past Medical History:  Diagnosis Date  . Cancer (HCC)    esophageal  . Dysphagia   . GERD (gastroesophageal reflux disease)     Past Surgical History:  Procedure Laterality Date  . ABDOMINAL HYSTERECTOMY    . CHOLECYSTECTOMY    . ESOPHAGOGASTRODUODENOSCOPY (EGD) WITH PROPOFOL N/A 12/07/2016   Procedure: ESOPHAGOGASTRODUODENOSCOPY (EGD) WITH PROPOFOL;  Surgeon: Jonathon Bellows, MD;  Location: [facility] ENDOSCOPY;  Service: Gastroenterology;  Laterality: N/A;  . ESOPHAGOGASTRODUODENOSCOPY (EGD) WITH PROPOFOL N/A 12/26/2016   Procedure:  ESOPHAGOGASTRODUODENOSCOPY (EGD) WITH PROPOFOL WITH DILATION;  Surgeon: Jonathon Bellows, MD;  Location: [facility] ENDOSCOPY;  Service: Gastroenterology;  Laterality: N/A;  . EYE SURGERY    . GASTROJEJUNOSTOMY N/A 01/16/2017   Procedure: LAPROSCOPIC ASSIST FEEDING JEJUNOSTOMY TUBE;  Surgeon: Johnathan Hausen, MD;  Location: [facility] ORS;  Service: General;  Laterality: N/A;  . IR FLUORO GUIDE PORT INSERTION RIGHT  01/10/2017  . IR REPLACE G-TUBE SIMPLE WO FLUORO  03/15/2017  . IR REPLC DUODEN/JEJUNO TUBE PERCUT W/FLUORO  03/05/2017  . PICC LINE INSERTION Right     Family History  Problem Relation Age of Onset  . Heart failure Father   . Prostate cancer Neg Hx   . Bladder Cancer Neg Hx   . Kidney cancer Neg Hx    Patient spotted at age 70 with complications after gallbladder surgery, mother died at age 79 with a perforated gastric ulcer and history of breast cancer, he has 1  brother at age 45 with alive with heart trouble and diabetes and also known Barrett's esophagus.  One brother died in the [place] in 67 at age 67 of myocardial infarction.  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  No Known Allergies  Current Outpatient Medications  Medication Sig Dispense Refill  . Amino Acids-Protein Hydrolys (FEEDING SUPPLEMENT, PRO-STAT SUGAR FREE 64,) LIQD Place 30 mLs into feeding tube daily. (Patient not taking: Reported on 04/05/2017) 900 mL 0  . dexamethasone (DECADRON) 4 MG tablet 8 mg by Per J Tube route daily. Start the day after chemo for two days     . hydrocortisone cream-nystatin cream-zinc oxide Apply 1 application 4 (four) times daily topically. (Patient not taking: Reported on 04/05/2017) 4 oz 3  . loperamide (IMODIUM) 2 MG capsule Take 1 capsule (2 mg total) by mouth as needed for diarrhea or loose stools (take two tablests after the first loose stool, then 1 tablet after each loose stool). (Patient not taking: Reported on 04/05/2017)    . morphine (ROXANOL) 20 MG/ML concentrated solution Take 0.25 mLs (5 mg total) by mouth every 4 (four) hours as needed for severe pain. (Patient not taking: Reported on 04/05/2017) 30 mL 0  . Nutritional Supplements (FEEDING SUPPLEMENT, VITAL 1.5 CAL,) LIQD Give vital 1.5 via continuous pump at 44ml/hr for 22 hours.  Flush with 262ml of water before starting tube feeding and after stopping tube feeding.  Provide additional 275ml of water 2 other times during waking hours via J tube. (Patient not taking: Reported on  04/05/2017) 1540 mL 12  . ondansetron (ZOFRAN-ODT) 4 MG disintegrating tablet TAKE 1 TABLET BY MOUTH EVERY 8 HOURS AS NEEDED FOR NAUSEA AND VOMITING  0  . prochlorperazine (COMPAZINE) 10 MG tablet TAKE 1 TABLET (10 MG TOTAL) BY MOUTH EVERY 6 (SIX) HOURS AS NEEDED (NAUSEA OR VOMITING).  1   No current facility-administered medications for this visit.    Facility-Administered Medications Ordered in Other Visits  Medication Dose Route Frequency Provider Last Rate Last Dose  . 0.9 %  sodium chloride infusion   Intravenous  Once Sindy Guadeloupe, MD      . ondansetron [facility]) 8 mg, dexamethasone (DECADRON) 10 mg in sodium chloride 0.9 % 50 mL IVPB   Intravenous Once Sindy Guadeloupe, MD        Pertinent items are noted in HPI.   Review of Systems:  Review of Systems  Constitutional: Positive for malaise/fatigue and weight loss. Negative for chills, diaphoresis and fever.  HENT: Negative.   Eyes: Negative.   Respiratory: Positive for cough.   Cardiovascular: Negative.   Gastrointestinal: Positive for nausea and vomiting. Negative for blood in stool and melena.  Genitourinary: Negative.   Musculoskeletal: Negative.   Skin: Negative.   Neurological: Positive for weakness.  Endo/Heme/Allergies: Negative.   Psychiatric/Behavioral: Negative.       Physical Exam: BP 111/75   Pulse (!) 110   Resp 20   Ht 5\' 9"  (1.753 m)   Wt 173 lb (78.5 kg)   SpO2 95%   BMI 25.55 kg/m   Wt Readings from Last 3 Encounters:  04/05/17 173 lb (78.5 kg)  03/30/17 171 lb 12.8 oz (77.9 kg)  03/20/17 170 lb (77.1 kg)   PHYSICAL EXAMINATION: General appearance: alert, cooperative, appears older than stated age, cachectic and fatigued Head: Normocephalic, without obvious abnormality, atraumatic Neck: no adenopathy, no carotid bruit, no JVD, supple, symmetrical, trachea midline and thyroid not enlarged, symmetric, no tenderness/mass/nodules Lymph nodes: Cervical, supraclavicular, and axillary nodes  normal. Resp: clear to auscultation bilaterally Back: symmetric, no curvature. ROM normal. No CVA tenderness. Cardio: regular rate and rhythm, S1, S2 normal, no murmur, click, rub or gallop GI: Feeding jejunostomy is intact without leakage in the skin around it is much improved Extremities: extremities normal, atraumatic, no cyanosis or edema and Homans sign is negative, no sign of DVT Neurologic: Grossly normal  Diagnostic Studies & Laboratory data:     Recent Radiology Findings:   Dg Esophagus  Result Date: 03/29/2017 CLINICAL DATA:  History of esophageal malignancy status post chemotherapy and radiation therapy. Patient has been unable to eat solid food since October 2018 and only small amounts of thin liquids passed. The patient has vomiting if eating or drinking more than a small amount of thin liquid. Patient uses a G-tube for nurse mint and medication administration. EXAM: ESOPHOGRAM/BARIUM SWALLOW TECHNIQUE: Single contrast examination was performed using thin barium or water soluble. FLUOROSCOPY TIME:  Fluoroscopy Time:  30 seconds Radiation Exposure Index (if provided by the fluoroscopic device): 873 micro Gy per meters square Number of Acquired Spot Images: 5+ 2 video loops COMPARISON:  PET-CT study of October 24th 2018 and barium swallow examination of December 26, 2016. FINDINGS: The patient ingested a small amount of thin barium. The hypopharynx distended well. The cervical esophagus also distended well. The proximal and middle thirds of the esophagus distended well. Approximately 7 cm above the GE junction abrupt long segment tapering of the caliber of the distal esophagus to less than 5 mm was observed. This only transiently relax but never exceeded approximately 3 mm in diameter. The mid esophagus just proximal to the transition point was mildly dilated. 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 More  proximally no abnormality in the esophagus is seen. Electronically Signed   By: David  Martinique M.D.   On: 03/29/2017 10:14   I have independently reviewed the above radiology studies  and reviewed the findings with the patient.     Nm Pet Image Initial (pi) Skull  Base To Thigh  Result Date: 01/03/2017 CLINICAL DATA:  Initial treatment strategy for esophageal cancer. EXAM: NUCLEAR MEDICINE PET SKULL BASE TO THIGH TECHNIQUE: Twelve point for mCi F-18 FDG was injected intravenously. Full-ring PET imaging was performed from the skull base to thigh after the radiotracer. CT data was obtained and used for attenuation correction and anatomic localization. FASTING BLOOD GLUCOSE:  Value: 127 mg/dl COMPARISON:  CT chest abdomen pelvis 12/26/2016. FINDINGS: NECK: No hypermetabolic lymph nodes in the neck. CT images show no acute findings. CHEST: No hypermetabolic mediastinal, hilar or axillary lymph nodes. Distal esophageal mass measures approximately 2.5 x 2.7 cm with an SUV max of 8.0. No adjacent hypermetabolic adenopathy. No hypermetabolic pulmonary nodules. Atherosclerotic calcification of the arterial vasculature, including coronary arteries. Right PICC tip terminates at the SVC RA junction. Heart is mildly enlarged. No pericardial or pleural effusion. Esophagus is dilated throughout its course, to the level of the above-described esophageal mass. ABDOMEN/PELVIS: No abnormal hypermetabolism in the liver, adrenal glands, spleen or pancreas. No hypermetabolic lymph nodes. Subcentimeter low-attenuation lesion in the dome of the liver is too small to characterize. Cholecystectomy. Adrenal glands, kidneys, spleen, pancreas, stomach and bowel are grossly unremarkable. Atherosclerotic calcification of the arterial vasculature without aneurysm. Prostate is mildly enlarged. No free fluid. SKELETON: No abnormal osseous hypermetabolism. IMPRESSION: 1. Hypermetabolic distal esophageal mass with associated esophageal  dilatation. No hypermetabolic adenopathy or distant metastatic disease. 2. Aortic atherosclerosis (ICD10-170.0). Coronary artery calcification. Electronically Signed   By: Lorin Picket M.D.   On: 01/03/2017 14:44   Ct Chest Wo Contrast/ Ct Abdomen Wo Contrast  Result Date: 12/26/2016 CLINICAL DATA:  Esophageal cancer. Gastroesophageal reflux disease. Dysphagia. Endoscopy from earlier today demonstrating esophageal stenosis at approximately 40 cm from the incisors. Rule out perforation of esophagus. EXAM: CT CHEST, ABDOMEN WITH CONTRAST TECHNIQUE: Multidetector CT imaging of the chest, abdomen was performed following the standard protocol during bolus administration of intravenous contrast. CONTRAST:  75 cc of Isovue-300 COMPARISON:  Esophagram of earlier today.  Prior CTs of 12/11/2016. FINDINGS: CT CHEST FINDINGS Cardiovascular: Aortic and branch vessel atherosclerosis. Normal heart size, without pericardial effusion. Multivessel coronary artery atherosclerosis. A right-sided PICC line terminates at the high right atrium. Mediastinum/Nodes: No supraclavicular adenopathy. No mediastinal or definite hilar adenopathy, given limitations of unenhanced CT. Soft tissue fullness at the distal esophagus including on image 50/series 2. This is similar. The more proximal esophagus is dilated and contrast filled from today's esophagram. No extraluminal contrast identified. No pneumomediastinum. Lungs/Pleura: No pleural fluid. Right upper lobe subpleural calcified granuloma on image 65/series 4. 5 mm lingular nodule on image 97/series 4 is is similar to on the prior. Minimal subpleural left lower lobe nodularity at 3 mm on image 127/series 4. Not readily apparent on the prior, favored to represent a subpleural lymph node. Musculoskeletal: No acute osseous abnormality. CT ABDOMEN  FINDINGS Hepatobiliary: High left hepatic lobe subcentimeter low-density lesion is likely a cyst. Cholecystectomy, without biliary ductal  dilatation. Pancreas: Pancreatic atrophy, without duct dilatation or dominant mass. Spleen: Old granulomatous disease in the spleen. Adrenals/Urinary Tract: Normal adrenal glands. No renal calculi or hydronephrosis. Stomach/Bowel: Normal appearance of the stomach. Normal abdominal bowel loops. Vascular/Lymphatic: Aortic and branch vessel atherosclerosis. Gastrohepatic ligament adenopathy is again identified, including at 1.3 cm on image 53/series 2. Other:  No ascites.  No abdominal peritoneal or omental metastasis. Musculoskeletal: No acute osseous abnormality. IMPRESSION: 1. Soft tissue fullness in the distal esophagus, likely representing the primary site of malignancy. This is at least  partially obstructive, with contrast from today's esophagram identified within a dilated more superior esophagus. 2. Isolated gastrohepatic ligament adenopathy, highly suspicious for metastatic disease. 3. No evidence of esophageal perforation. 4. Nonspecific lingular nodule, similar. 5. Coronary artery atherosclerosis. Aortic Atherosclerosis (ICD10-I70.0). Electronically Signed   By: Abigail Miyamoto M.D.   On: 12/26/2016 14:58   Ct Chest W Contrast Ct Abdomen Pelvis W Contrast  Result Date: 12/11/2016 CLINICAL DATA:  Patient with progressive difficulty swallowing. EXAM: CT CHEST, ABDOMEN, AND PELVIS WITH CONTRAST TECHNIQUE: Multidetector CT imaging of the chest, abdomen and pelvis was performed following the standard protocol during bolus administration of intravenous contrast. CONTRAST:  141mL ISOVUE-300 IOPAMIDOL (ISOVUE-300) INJECTION 61% COMPARISON:  CT abdomen pelvis 05/11/2006 FINDINGS: CT CHEST FINDINGS Cardiovascular: Normal heart size. Coronary arterial vascular calcifications. Thoracic aortic vascular calcifications. Right upper extremity PICC line tip terminates in the superior vena cava. Mediastinum/Nodes: No enlarged axillary, mediastinal or hilar lymphadenopathy. There is wall thickening and narrowing of the distal  esophagus (image 50; series 2). Lungs/Pleura: There is a 7 mm subpleural nodule in the lingula (image 96; series 4). Calcified granuloma within the medial right upper lobe (image 63; series 4). Dependent atelectasis/ scarring within the lower lobes bilaterally. No pleural effusion or pneumothorax. Musculoskeletal: Thoracic spine degenerative changes. No aggressive or acute appearing osseous lesions. CT ABDOMEN PELVIS FINDINGS Hepatobiliary: The liver is normal in size and contour. Subcentimeter too small to characterize low-attenuation lesion left hepatic lobe (image 50 ; series 2). Fatty deposition adjacent to the falciform ligament. Status post cholecystectomy. No intrahepatic or extrahepatic biliary ductal dilatation. Pancreas: Mildly atrophic. Spleen: Calcified granulomas in the spleen. Adrenals/Urinary Tract: The adrenal glands are normal. Kidneys enhance symmetrically with contrast. No hydronephrosis. Urinary bladder is unremarkable. Stomach/Bowel: Colon is decompressed, limiting evaluation. Suggestion of mild colonic wall thickening. Normal morphology of the stomach. There is a 1.3 cm soft tissue nodule adjacent to the GE junction (image 53; series 2). Vascular/Lymphatic: Normal caliber abdominal aorta. Peripheral calcified atherosclerotic plaque. No retroperitoneal lymphadenopathy. Reproductive: Prostate unremarkable. Other: Bilateral fat containing inguinal hernias. Musculoskeletal: Lumbar spine degenerative changes. No aggressive or acute appearing osseous lesions. IMPRESSION: There is a 1.3 cm soft tissue nodule adjacent to the GE junction. This is favored to represent an enlarged lymph node, potentially reactive or metastatic in etiology. The colon is decompressed however there is suggestion of wall thickening, raising the possibility of colitis. There is a 7 mm nodule within the left upper lobe. Non-contrast chest CT at 6-12 months is recommended. If the nodule is stable at time of repeat CT, then  future CT at 18-24 months (from today's scan) is considered optional for low-risk patients, but is recommended for high-risk patients. This recommendation follows the consensus statement: Guidelines for Management of Incidental Pulmonary Nodules Detected on CT Images: From the Fleischner Society 2017; Radiology 2017; 284:228-243. Narrowing and wall thickening of the distal esophagus, compatible with known distal esophageal stricture. Electronically Signed   By: Lovey Newcomer M.D.   On: 12/11/2016 20:02   Dg Esophagus  Result Date: 12/06/2016 CLINICAL DATA:  Difficulty swallowing, constant vomiting EXAM: ESOPHOGRAM/BARIUM SWALLOW TECHNIQUE: Single contrast examination was performed using  thick barium. FLUOROSCOPY TIME:  Fluoroscopy Time:  0.5 minute Radiation Exposure Index (if provided by the fluoroscopic device): 5.7 mGy Number of Acquired Spot Images: 0 COMPARISON:  None. FINDINGS: There was normal pharyngeal anatomy and motility. Patient is extremely nauseous. Mild esophageal dilatation with a high-grade stricture of the distal esophagus with irregular margins. IMPRESSION: 1. High-grade stricture  of the distal esophagus with irregular margins. 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 Recommend upper endoscopy. Electronically Signed   By: Kathreen Devoid   On: 12/06/2016 11:53   Dg Esophagus W/water Sol Cm  Result Date: 12/26/2016 CLINICAL DATA:  Recent esophageal instrumentation. Pain. Evaluate for esophageal rupture. EXAM: ESOPHOGRAM/BARIUM SWALLOW TECHNIQUE: Single contrast examination was performed using water-soluble contrast. FLUOROSCOPY TIME:  1 minutes and 6 seconds COMPARISON:  None. FINDINGS: The patient ingested water-soluble contrast and imaging was obtained. There is an obstruction of the distal esophagus, just above the gastroesophageal junction. A fluid level exists in the esophagus. Contrast never traversed beyond the distal esophagus. The  patient vomited. There is no evidence of extravasation to suggest leak or rupture. IMPRESSION: No evidence of contrast extravasation to suggest esophageal injury or perforation There is abrupt obstruction of the distal esophagus just above the gastroesophageal junction. Electronically Signed   By: Marybelle Killings M.D.   On: 12/26/2016 14:46     I have independently reviewed the above radiology studies  and reviewed the findings with the patient.   Recent Lab Findings: Lab Results  Component Value Date   WBC 7.8 03/30/2017   HGB 12.2 (L) 03/30/2017   HCT 36.9 (L) 03/30/2017   PLT 281 03/30/2017   GLUCOSE 126 (H) 03/30/2017   TRIG 92 12/11/2016   ALT 39 03/30/2017   AST 24 03/30/2017   NA 133 (L) 03/30/2017   K 4.3 03/30/2017   CL 100 (L) 03/30/2017   CREATININE 0.81 03/30/2017   BUN 17 03/30/2017   CO2 25 03/30/2017   INR 1.04 01/10/2017   Path 12/26/2016  Collected: 12/26/16 1152  Resulting lab: [id number]  Value: Surgical Pathology  CASE: [id number]  PATIENT: Eliseo Gum  Surgical Pathology Report      SPECIMEN SUBMITTED:  A. 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 ESOPHAGUS STRICTURE; COLD BIOPSY:  - SQUAMOUS CELL CARCINOMA.   Comment:  These findings were communicated to Dr. Vicente Males on 12/27/2016.   GROSS DESCRIPTION:   A. Labeled: esophageal stricture C BX   Tissue fragment(s): 3   Size: 0.1-0.4 cm   Description: pink fragments   Entirely submitted in 1 cassette(s).     Final Diagnosis performed by Quay Burow, MD. Electronically signed  12/27/2016 2:05:42PM     The electronic signature indicates that the named Attending Pathologist  has evaluated the specimen   Technical component performed at [facility], [address] [place],  [place] [address]  Lab: [phone] Dir: Darrick Penna. Evette Doffing, MD    Professional component performed at [facility], [facility],  [place], [place], [place] [address]  Lab: [phone] Dir: Dellia Nims. Rubinas, MD    ENDO:12/26/2016 One severe stenosis was found 38 to 40 cm from the incisors. This measured 6 mm (inner diameter) x 2 cm (in length) and was traversed after downsizing scope and dilating. A guide wire was placed, then the scope was withdrawn. Using the wire as a guide, dilation with an 10-19-08 mm pyloric balloon dilator was performed under fluoroscopic guidance. The dilation site was examined following endoscope reinsertion and showed mild improvement in luminal narrowing. We were unable to pass the adult scope , we subsequently further dilated the stricture with a CRE dilator serially from 10-12 mm and subsequently the adult scope  was able to pass through. The stricture still appeared to be very tight and the scope was not able to easily glide past. Biopsies were taken with a cold forceps for histology.     Assessment / Plan:   Patient continues to have a difficult time tolerating chemotherapy and radiation.   Patient has approximately 3 weeks following the completion of his radiation therapy Still have evidence on Gastrografin swallow of distal esophageal stricture, dependent on jejunostomy tube for tube feedings and nutrition Patient's to have a follow-up PET scan in early February, I will see him back soon after this on February 7 to review the scans and make further recommendations about if and when to proceed with esophagectomy   Grace Isaac MD      [address] [place] [address] Office [phone]   Beeper [phone]  04/05/2017 3:31 PM

## 2017-04-11 ENCOUNTER — Ambulatory Visit
Admission: RE | Admit: 2017-04-11 | Discharge: 2017-04-11 | Disposition: A | Discharge status: Home / Self Care | Payer: Medicare Other | Source: Ambulatory Visit | Attending: Radiation Oncology | Admitting: Radiation Oncology

## 2017-04-11 ENCOUNTER — Encounter: Payer: Self-pay | Admitting: Radiation Oncology

## 2017-04-11 ENCOUNTER — Other Ambulatory Visit: Payer: Self-pay

## 2017-04-11 VITALS — BP 121/77 | HR 112 | Temp 96.8°F | Resp 18 | Wt 173.3 lb

## 2017-04-11 DIAGNOSIS — Z923 Personal history of irradiation: Secondary | ICD-10-CM | POA: Diagnosis not present

## 2017-04-11 DIAGNOSIS — Z934 Other artificial openings of gastrointestinal tract status: Secondary | ICD-10-CM | POA: Insufficient documentation

## 2017-04-11 DIAGNOSIS — C155 Malignant neoplasm of lower third of esophagus: Secondary | ICD-10-CM | POA: Insufficient documentation

## 2017-04-11 DIAGNOSIS — Z9221 Personal history of antineoplastic chemotherapy: Secondary | ICD-10-CM | POA: Insufficient documentation

## 2017-04-11 NOTE — Progress Notes (Signed)
Radiation Oncology Follow up Note  Name: Casey Acevedo   Date:   04/11/2017 MRN:  [id number] DOB: [birth year]    77 year old male now 1 month out having completed combined modality treatment with chemotherapy and radiation therapy for squamous cell carcinoma of the esophagus.  REFERRING PROVIDER: Idelle Crouch, MD  HPI: Patient is a 77 year old male now about 1 month having completed concurrent chemotherapy and radiation therapy for distal esophageal squamous cell carcinoma.  He is seen today in routine follow-up is doing fair.  He continues not to have ability to eat although he is swallowing his saliva.  He does have a jejunostomy tube placed.  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  He has been seen by thoracic surgeon and is scheduled for reevaluation after his PET/CT scan next week.  COMPLICATIONS OF TREATMENT: none  FOLLOW UP COMPLIANCE: keeps appointments   PHYSICAL EXAM:  BP 121/77   Pulse (!) 112   Temp (!) 96.8 F (36 C)   Resp 18   Wt 173 lb 4.5 oz (78.6 kg)   BMI 25.59 kg/m  Thin appearing male in NAD. Well-developed well-nourished patient in NAD. HEENT reveals PERLA, EOMI, discs not visualized.  Oral cavity is clear. No oral mucosal lesions are identified. Neck is clear without evidence of cervical or supraclavicular adenopathy. Lungs are clear to A&P. Cardiac examination is essentially unremarkable with regular rate and rhythm without murmur rub or thrill. Abdomen is benign with no organomegaly or masses noted. Motor sensory and DTR levels are equal and symmetric in the upper and lower extremities. Cranial nerves II through XII are grossly intact. Proprioception is intact. No peripheral adenopathy or edema is identified. No motor or sensory levels are noted. Crude visual fields are within normal range.  RADIOLOGY RESULTS: Barium swallow was reviewed  and compatible with the above-stated findings  PLAN: At the present time patient will have a PET CT scan which I will review independently.  He will follow-up with medical oncology and thoracic surgeon for possibility of esophagectomy.  I have asked to see him back in 3 months for follow-up.  We will be interested in his final pathology.  Patient and wife know to call with any concerns.  I would like to take this opportunity to thank you for allowing me to participate in the care of your patient.Noreene Filbert, MD

## 2017-04-12 ENCOUNTER — Telehealth: Payer: Self-pay

## 2017-04-12 NOTE — Telephone Encounter (Signed)
Nutrition Follow-up:  Spoke with patient via phone for nutrition follow-up.  Patient reports planning PET scan tomorrow before finding out future plan of care.  Barium Swallow showed persistent stricture despite completion of treatment. Taking 2 ensure plus per day and 2, 8 oz water per day.    Patient reports J-tube is not leaking and doing better.  Reports getting in 4 cartons of vital 1.5 daily via tube feeding pump.  Reports that he is continuing with water flush between feedings.  Reports that he feels better and is not as shaky. Able to walk around house and to doctor appointments.  Reports normal/loose bowel movements but no concern regarding stool.     Medications: reviewed  Labs: reviewed  Anthropometrics:   Weight on 1/30 173 lb 4.5 oz increased but decreased from 180lb at start of therapy.   Estimated Energy Needs  Kcals: 2050-2460 calories/d Protein: 102-123 g/d Fluid: 2.4 L/d  NUTRITION DIAGNOSIS: Inadequate oral intake continues   MALNUTRITION DIAGNOSIS: moderate malnutrition improving with tube feeding and gaining weight   INTERVENTION:   Recommend patient continue with vital 1.5, 4.5 cartons per day via continuous pump. Continue water flush of 179ml 4 times per day with increase in oral water intake of 2, 8 oz cups per day. Continue to drink 2 ensure plus per day for additional calories and free water.  Will provide patient with 3rd complimentary case on Feb 8th at next office visit.   Tube feeding plus ensure plus and water providing 2255 calories, 98 g of protein and 2037ml free water (formula, ensure plus, water flush and oral water)    MONITORING, EVALUATION, GOAL: weight, TF tolerance and intake   NEXT VISIT: phone follow-up  Durward Matranga B. Zenia Resides, [place], [place] Registered Dietitian [phone] (pager)

## 2017-04-13 ENCOUNTER — Encounter
Admission: RE | Admit: 2017-04-13 | Discharge: 2017-04-13 | Disposition: A | Discharge status: Home / Self Care | Payer: Medicare Other | Source: Ambulatory Visit | Attending: Oncology | Admitting: Oncology

## 2017-04-13 DIAGNOSIS — C155 Malignant neoplasm of lower third of esophagus: Secondary | ICD-10-CM | POA: Insufficient documentation

## 2017-04-13 LAB — GLUCOSE, CAPILLARY: GLUCOSE-CAPILLARY: 97 mg/dL (ref 65–99)

## 2017-04-13 IMAGING — PT NM PET TUM IMG RESTAG (PS) SKULL BASE T - THIGH
1 of 10 series · 1 of 25 positions shown · non-contrast
Comparison: [DATE]

CLINICAL DATA: Subsequent treatment strategy for esophageal cancer.

EXAM:
NUCLEAR MEDICINE PET SKULL BASE TO THIGH
TECHNIQUE: 13 mCi F-18 FDG was injected intravenously. Full-ring PET imaging
was performed from the skull base to thigh after the radiotracer. CT
data was obtained and used for attenuation correction and anatomic
localization.
FASTING BLOOD GLUCOSE:  Value: 97 mg/dl

[Series 3: ct wb 5.0 b30f · axial · 5.0mm · 0.98mm/px · 1 of 329 slices shown]
[im 329/329  brain]
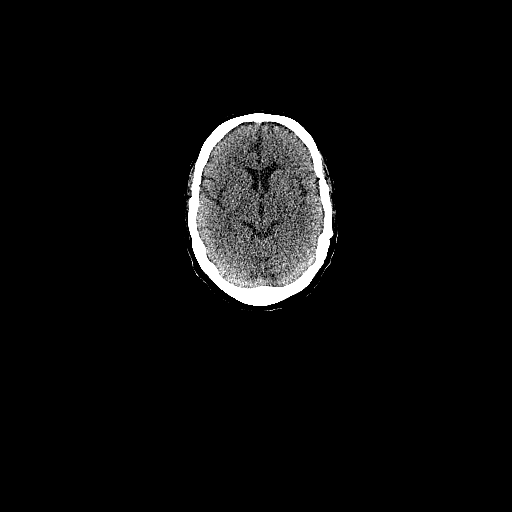

[1 of 25 positions shown; findings below may reference images not displayed]

FINDINGS: NECK: No hypermetabolic lymph nodes in the neck.

CHEST: Interval decrease and hypermetabolism in the distal esophagus
with SUV max = 4.1 today compared to 8.0 previously. Esophageal
dilatation has decreased in the interval. Soft tissue fullness in
the distal esophagus has also decreased.

No hypermetabolic mediastinal or hilar nodes. No suspicious
pulmonary nodules on the CT scan.

Right Port-A-Cath tip is positioned in the distal SVC. Coronary
artery calcification is evident. Atherosclerotic calcification is
noted in the wall of the thoracic aorta.

ABDOMEN/PELVIS: No abnormal hypermetabolic activity within the
liver, pancreas, adrenal glands, or spleen. No hypermetabolic lymph
nodes in the abdomen or pelvis.

Tiny hyper scratches that tiny hypoattenuating lesion in the dome of
the left liver is stable. Gallbladder surgically absent. There is
abdominal aortic atherosclerosis without aneurysm. PEG tube tip
noted in the stomach.

SKELETON: No focal hypermetabolic activity to suggest skeletal
metastasis.
IMPRESSION: 1. Interval decrease in soft tissue fullness and hypermetabolism
associated with the distal esophageal neoplasm.
2. No evidence for interval development of hypermetabolic metastatic
disease in the neck, chest, abdomen, or pelvis.
3. Coronary artery and Aortic Atherosclerois ([KB]-170.0)

## 2017-04-13 MED ORDER — FLUDEOXYGLUCOSE F - 18 (FDG) INJECTION
13.0100 | Freq: Once | INTRAVENOUS | Status: AC | PRN
  Administered 2017-04-13: 13.01 via INTRAVENOUS

## 2017-04-19 ENCOUNTER — Ambulatory Visit (INDEPENDENT_AMBULATORY_CARE_PROVIDER_SITE_OTHER): Payer: Medicare Other | Admitting: Cardiothoracic Surgery

## 2017-04-19 ENCOUNTER — Inpatient Hospital Stay: Payer: Medicare Other | Attending: Oncology

## 2017-04-19 VITALS — BP 124/83 | HR 108 | Resp 20 | Ht 69.0 in | Wt 174.6 lb

## 2017-04-19 DIAGNOSIS — Z9221 Personal history of antineoplastic chemotherapy: Secondary | ICD-10-CM | POA: Insufficient documentation

## 2017-04-19 DIAGNOSIS — Z923 Personal history of irradiation: Secondary | ICD-10-CM | POA: Insufficient documentation

## 2017-04-19 DIAGNOSIS — C155 Malignant neoplasm of lower third of esophagus: Secondary | ICD-10-CM

## 2017-04-20 ENCOUNTER — Inpatient Hospital Stay: Payer: Medicare Other

## 2017-04-20 ENCOUNTER — Encounter: Payer: Self-pay | Admitting: Oncology

## 2017-04-20 ENCOUNTER — Inpatient Hospital Stay (HOSPITAL_BASED_OUTPATIENT_CLINIC_OR_DEPARTMENT_OTHER): Payer: Medicare Other | Admitting: Oncology

## 2017-04-20 VITALS — BP 111/77 | HR 103 | Temp 97.2°F | Resp 18 | Ht 69.0 in | Wt 175.6 lb

## 2017-04-20 DIAGNOSIS — Z934 Other artificial openings of gastrointestinal tract status: Secondary | ICD-10-CM | POA: Diagnosis not present

## 2017-04-20 DIAGNOSIS — Z9221 Personal history of antineoplastic chemotherapy: Secondary | ICD-10-CM | POA: Diagnosis not present

## 2017-04-20 DIAGNOSIS — C155 Malignant neoplasm of lower third of esophagus: Secondary | ICD-10-CM

## 2017-04-20 DIAGNOSIS — Z87891 Personal history of nicotine dependence: Secondary | ICD-10-CM | POA: Diagnosis not present

## 2017-04-20 DIAGNOSIS — Z923 Personal history of irradiation: Secondary | ICD-10-CM | POA: Diagnosis not present

## 2017-04-20 LAB — COMPREHENSIVE METABOLIC PANEL
ALBUMIN: 3.5 g/dL (ref 3.5–5.0)
ALT: 26 U/L (ref 17–63)
AST: 19 U/L (ref 15–41)
Alkaline Phosphatase: 102 U/L (ref 38–126)
Anion gap: 8 (ref 5–15)
BUN: 21 mg/dL — AB (ref 6–20)
CHLORIDE: 104 mmol/L (ref 101–111)
CO2: 24 mmol/L (ref 22–32)
CREATININE: 0.82 mg/dL (ref 0.61–1.24)
Calcium: 8.8 mg/dL — ABNORMAL LOW (ref 8.9–10.3)
GFR calc Af Amer: >60 mL/min (ref >60)
GLUCOSE: 102 mg/dL — AB (ref 65–99)
Potassium: 3.8 mmol/L (ref 3.5–5.1)
Sodium: 136 mmol/L (ref 135–145)
Total Bilirubin: 0.6 mg/dL (ref 0.3–1.2)
Total Protein: 7.2 g/dL (ref 6.5–8.1)

## 2017-04-20 LAB — CBC WITH DIFFERENTIAL/PLATELET
Basophils Absolute: 0 10*3/uL (ref 0–0.1)
Basophils Relative: 0 %
EOS PCT: 2 %
Eosinophils Absolute: 0.1 10*3/uL (ref 0–0.7)
HCT: 39.7 % — ABNORMAL LOW (ref 40.0–52.0)
Hemoglobin: 13.1 g/dL (ref 13.0–18.0)
LYMPHS ABS: 1.7 10*3/uL (ref 1.0–3.6)
LYMPHS PCT: 27 %
MCH: 29.6 pg (ref 26.0–34.0)
MCHC: 32.9 g/dL (ref 32.0–36.0)
MCV: 90 fL (ref 80.0–100.0)
MONO ABS: 1.1 10*3/uL — AB (ref 0.2–1.0)
MONOS PCT: 17 %
Neutro Abs: 3.5 10*3/uL (ref 1.4–6.5)
Neutrophils Relative %: 54 %
Platelets: 278 10*3/uL (ref 150–440)
RBC: 4.41 MIL/uL (ref 4.40–5.90)
RDW: 18.6 % — AB (ref 11.5–14.5)
WBC: 6.5 10*3/uL (ref 3.8–10.6)

## 2017-04-20 NOTE — Progress Notes (Signed)
Pt doing good with his tube feedings,gerhardt told him not to take any food in.he is taking nutritional drinks 2-3 a day orally. He takes his time and it takes 1-2 hours to get it sown.

## 2017-04-23 NOTE — Progress Notes (Signed)
Hematology/Oncology Consult note [facility]  Telephone:([phone] [phone]  Patient Care Team: Idelle Crouch, MD as PCP - General (Internal Medicine) Clent Jacks, RN as Registered Nurse Grace Isaac, MD as Consulting Physician (Cardiothoracic Surgery) Sindy Guadeloupe, MD as Consulting Physician (Oncology) Noreene Filbert, MD as Referring Physician (Radiation Oncology)   Name of the patient: Casey Acevedo  [id number]  1940-07-18   Date of visit: 04/23/17   Diagnosis-non metastatic esophageal cancer [id number] [facility]   Chief complaint/ Reason for visit-routine f/u of esophageal cancer   Heme/Onc history:1. Patient is a 77 year old gentleman with no significant past medical history and takes no medications he has a remote history of smokingduring his college days and quit smoking thereafter. No history of any alcohol intake. He was recently admitted to the hospital on 12/06/2016 with symptoms of progressive dysphagia which he states has been going on since the starting of September. His dysphagia got to the point that he began to have pain even on swallowing icecream. He underwent EGD at that time which showed but high 2 cm long stricture 2 cm above the GE junction. Biopsies at that time were negative for malignancy. Given that he was unable to eat he was started on TPN at that time and plan was to get a repeat EGD with possible biopsy  2. Patient underwent repeat EGD on 12/26/2016 where he was again noted to have a lower esophageal stricture which reduced the lumen to less than 3-4 mm. There was no evidence of Barrett's esophagus. 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 Shortly after the stricture was dilated the stricture did close up significantly. Postprocedure patient complained of abdominal pain and there was a concern for  perforation and hence a repeat CT abdomen was obtained which did not show any evidence of perforation  3. CT abdomen pelvis as well as CT chest with contrast on 10/01/2018showed: 7 mm sub pleural nodule in the lingula. No evidence of axillary mediastinal or hilar adenopathy.1.3 cm soft tissue nodule adjacent to the GE junctionwhich is favored to represent an enlarged lymph node potentially active or metastatic in etiology. Narrowing involving thickening of the distal esophagus compatible with known distal esophageal stricture  4. He lives with his wife and is independent of his ADLs and IADLs. Besides dysphagia patient reports no loss of appetite or unintentional weight loss over the last few months. He denies any pain  5.[place] been deemed to be a potential surgical candidate in the future. Plan for now is to proceed with concurrent chemoradiation followed by EUS upon completion of treatment given difficulty with the  second EGD  6. Chemo/RT started on 11/9/18and completed on 03/01/17   Interval history- overall patient is feeling better.  Reports that his energy levels are improving and trying to walk 1 mile every day.  He is still unable to take any oral foods because of his persistent stricture and is mainly dependent on his J-tube feeds at this time.  Denies any problems with his J-tube  ECOG PS- 1 Pain scale- 0 Opioid associated constipation- no  Review of systems- Review of Systems  Constitutional: Negative for chills, fever, malaise/fatigue and weight loss.  HENT: Negative for congestion, ear discharge and nosebleeds.   Eyes: Negative for blurred vision.  Respiratory: Negative for cough, hemoptysis, sputum production,  shortness of breath and wheezing.   Cardiovascular: Negative for chest pain, palpitations, orthopnea and claudication.  Gastrointestinal: Negative for abdominal pain, blood in stool,  constipation, diarrhea, heartburn, melena, nausea and vomiting.  Genitourinary: Negative for dysuria, flank pain, frequency, hematuria and urgency.  Musculoskeletal: Negative for back pain, joint pain and myalgias.  Skin: Negative for rash.  Neurological: Negative for dizziness, tingling, focal weakness, seizures, weakness and headaches.  Endo/Heme/Allergies: Does not bruise/bleed easily.  Psychiatric/Behavioral: Negative for depression and suicidal ideas. The patient does not have insomnia.       No Known Allergies   Past Medical History:  Diagnosis Date  . Cancer (HCC)    esophageal  . Dysphagia   . GERD (gastroesophageal reflux disease)      Past Surgical History:  Procedure Laterality Date  . ABDOMINAL HYSTERECTOMY    . CHOLECYSTECTOMY    . ESOPHAGOGASTRODUODENOSCOPY (EGD) WITH PROPOFOL N/A 12/07/2016   Procedure: ESOPHAGOGASTRODUODENOSCOPY (EGD) WITH PROPOFOL;  Surgeon: Jonathon Bellows, MD;  Location: [facility] ENDOSCOPY;  Service: Gastroenterology;  Laterality: N/A;  . ESOPHAGOGASTRODUODENOSCOPY (EGD) WITH PROPOFOL N/A 12/26/2016   Procedure: ESOPHAGOGASTRODUODENOSCOPY (EGD) WITH PROPOFOL WITH DILATION;  Surgeon: Jonathon Bellows, MD;  Location: [facility] ENDOSCOPY;  Service: Gastroenterology;  Laterality: N/A;  . EYE SURGERY    . GASTROJEJUNOSTOMY N/A 01/16/2017   Procedure: LAPROSCOPIC ASSIST FEEDING JEJUNOSTOMY TUBE;  Surgeon: Johnathan Hausen, MD;  Location: [facility] ORS;  Service: General;  Laterality: N/A;  . IR FLUORO GUIDE PORT INSERTION RIGHT  01/10/2017  . IR REPLACE G-TUBE SIMPLE WO FLUORO  03/15/2017  . IR REPLC DUODEN/JEJUNO TUBE PERCUT W/FLUORO  03/05/2017  . PICC LINE INSERTION Right     Social History   Socioeconomic History  . Marital status: Married    Spouse name: Not on file  . Number of children: Not on file  . Years of education: Not on file  . Highest education level: Not on file  Social Needs  . Financial resource strain: Not on file  . Food insecurity - worry: Not on  file  . Food insecurity - inability: Not on file  . Transportation needs - medical: Not on file  . Transportation needs - non-medical: Not on file  Occupational History  . Not on file  Tobacco Use  . Smoking status: Never Smoker  . Smokeless tobacco: Never Used  Substance and Sexual Activity  . Alcohol use: No  . Drug use: No  . Sexual activity: Not Currently  Other Topics Concern  . Not on file  Social History Narrative   Independent at baseline. Ambulatory    Family History  Problem Relation Age of Onset  . Heart failure Father   . Prostate cancer Neg Hx   . Bladder Cancer Neg Hx   . Kidney cancer Neg Hx      Current Outpatient Medications:  .  Amino Acids-Protein Hydrolys (FEEDING SUPPLEMENT, PRO-STAT SUGAR FREE 64,) LIQD, Place 30 mLs into feeding tube daily., Disp: 900 mL, Rfl: 0 .  Nutritional Supplements (FEEDING SUPPLEMENT, VITAL 1.5 CAL,) LIQD, Give vital 1.5 via continuous pump at 75ml/hr for 22 hours.  Flush with 277ml of water before starting tube feeding and after stopping tube feeding.  Provide additional 269ml of water 2 other times during waking hours via J tube., Disp: 1540 mL, Rfl: 12 .  dexamethasone (DECADRON) 4 MG tablet, 8 mg by Per J Tube route daily. Start the day after chemo for two days , Disp: , Rfl:  .  hydrocortisone cream-nystatin cream-zinc oxide, Apply  1 application 4 (four) times daily topically. (Patient not taking: Reported on 04/05/2017), Disp: 4 oz, Rfl: 3 .  loperamide (IMODIUM) 2 MG capsule, Take 1 capsule (2 mg total) by mouth as needed for diarrhea or loose stools (take two tablests after the first loose stool, then 1 tablet after each loose stool). (Patient not taking: Reported on 04/05/2017), Disp: , Rfl:  .  morphine (ROXANOL) 20 MG/ML concentrated solution, Take 0.25 mLs (5 mg total) by mouth every 4 (four) hours as needed for severe pain. (Patient not taking: Reported on 04/05/2017), Disp: 30 mL, Rfl: 0 .  ondansetron (ZOFRAN-ODT) 4 MG  disintegrating tablet, TAKE 1 TABLET BY MOUTH EVERY 8 HOURS AS NEEDED FOR NAUSEA AND VOMITING, Disp: , Rfl: 0 .  prochlorperazine (COMPAZINE) 10 MG tablet, TAKE 1 TABLET (10 MG TOTAL) BY MOUTH EVERY 6 (SIX) HOURS AS NEEDED (NAUSEA OR VOMITING)., Disp: , Rfl: 1 No current facility-administered medications for this visit.   Facility-Administered Medications Ordered in Other Visits:  .  0.9 %  sodium chloride infusion, , Intravenous, Once, Sindy Guadeloupe, MD .  ondansetron (ZOFRAN) 8 mg, dexamethasone (DECADRON) 10 mg in sodium chloride 0.9 % 50 mL IVPB, , Intravenous, Once, Sindy Guadeloupe, MD  Physical exam:  Vitals:   04/20/17 1433  BP: 111/77  Pulse: (!) 103  Resp: 18  Temp: (!) 97.2 F (36.2 C)  TempSrc: Tympanic  Weight: 175 lb 9.6 oz (79.7 kg)  Height: 5\' 9"  (1.753 m)   Physical Exam  Constitutional: He is oriented to person, place, and time and well-developed, well-nourished, and in no distress.  HENT:  Head: Normocephalic and atraumatic.  Eyes: EOM are normal. Pupils are equal, round, and reactive to light.  Neck: Normal range of motion.  Cardiovascular: Normal rate, regular rhythm and normal heart sounds.  Pulmonary/Chest: Effort normal and breath sounds normal.  Abdominal: Soft. Bowel sounds are normal.  J-tube in place.  No surrounding areas of excoriation  Neurological: He is alert and oriented to person, place, and time.  Skin: Skin is warm and dry.     CMP Latest Ref Rng & Units 04/20/2017  Glucose 65 - 99 mg/dL 102(H)  BUN 6 - 20 mg/dL 21(H)  Creatinine 0.61 - 1.24 mg/dL 0.82  Sodium 135 - 145 mmol/L 136  Potassium 3.5 - 5.1 mmol/L 3.8  Chloride 101 - 111 mmol/L 104  CO2 22 - 32 mmol/L 24  Calcium 8.9 - 10.3 mg/dL 8.8(L)  Total Protein 6.5 - 8.1 g/dL 7.2  Total Bilirubin 0.3 - 1.2 mg/dL 0.6  Alkaline Phos 38 - 126 U/L 102  AST 15 - 41 U/L 19  ALT 17 - 63 U/L 26   CBC Latest Ref Rng & Units 04/20/2017  WBC 3.8 - 10.6 K/uL 6.5  Hemoglobin 13.0 - 18.0 g/dL 13.1   Hematocrit 40.0 - 52.0 % 39.7(L)  Platelets 150 - 440 K/uL 278    No images are attached to the encounter.  Dg Esophagus  Result Date: 03/29/2017 CLINICAL DATA:  History of esophageal malignancy status post chemotherapy and radiation therapy. Patient has been unable to eat solid food since October 2018 and only small amounts of thin liquids passed. The patient has vomiting if eating or drinking more than a small amount of thin liquid. Patient uses a G-tube for nurse mint and medication administration. EXAM: ESOPHOGRAM/BARIUM SWALLOW TECHNIQUE: Single contrast examination was performed using thin barium or water soluble. FLUOROSCOPY TIME:  Fluoroscopy Time:  30 seconds Radiation Exposure Index (  if provided by the fluoroscopic device): 873 micro Gy per meters square Number of Acquired Spot Images: 5+ 2 video loops COMPARISON:  PET-CT study of October 24th 2018 and barium swallow examination of December 26, 2016. FINDINGS: The patient ingested a small amount of thin barium. The hypopharynx distended well. The cervical esophagus also distended well. The proximal and middle thirds of the esophagus distended well. Approximately 7 cm above the GE junction abrupt long segment tapering of the caliber of the distal esophagus to less than 5 mm was observed. This only transiently relax but never exceeded approximately 3 mm in diameter. The mid esophagus just proximal to the transition point was mildly dilated. 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 More proximally no abnormality in the esophagus is seen. Electronically Signed   By: David  Martinique M.D.   On: 03/29/2017 10:14   Nm Pet Image Restag (ps) Skull Base To Thigh  Result Date: 04/13/2017 CLINICAL DATA:  Subsequent treatment strategy for esophageal cancer. EXAM: NUCLEAR MEDICINE PET SKULL BASE TO THIGH TECHNIQUE: 13 mCi F-18 FDG was injected intravenously. Full-ring PET imaging  was performed from the skull base to thigh after the radiotracer. CT data was obtained and used for attenuation correction and anatomic localization. FASTING BLOOD GLUCOSE:  Value: 97 mg/dl COMPARISON:  01/03/2017 FINDINGS: NECK: No hypermetabolic lymph nodes in the neck. CHEST: Interval decrease and hypermetabolism in the distal esophagus with SUV max = 4.1 today compared to 8.0 previously. Esophageal dilatation has decreased in the interval. Soft tissue fullness in the distal esophagus has also decreased. No hypermetabolic mediastinal or hilar nodes. No suspicious pulmonary nodules on the CT scan. Right Port-A-Cath tip is positioned in the distal SVC. Coronary artery calcification is evident. Atherosclerotic calcification is noted in the wall of the thoracic aorta. ABDOMEN/PELVIS: No abnormal hypermetabolic activity within the liver, pancreas, adrenal glands, or spleen. No hypermetabolic lymph nodes in the abdomen or pelvis. Tiny hyper scratches that tiny hypoattenuating lesion in the dome of the left liver is stable. Gallbladder surgically absent. There is abdominal aortic atherosclerosis without aneurysm. PEG tube tip noted in the stomach. SKELETON: No focal hypermetabolic activity to suggest skeletal metastasis. IMPRESSION: 1. Interval decrease in soft tissue fullness and hypermetabolism associated with the distal esophageal neoplasm. 2. No evidence for interval development of hypermetabolic metastatic disease in the neck, chest, abdomen, or pelvis. 3. Coronary artery and Aortic Atherosclerois (ICD10-170.0) Electronically Signed   By: Misty Stanley M.D.   On: 04/13/2017 12:03     Assessment and plan- Patient is a 77 y.o. male non metastatic esophageal cancer [id number] SCCstatus post 6 cycles of carbotaxol  I have personally reviewed PET/CT scan images independently.  I also discussed his case at the tumor board and I have discussed the findings of PET/CT scan with the patient in detail.  There has been  interval response to treatment and decrease in the level of hypermetabolic some at the site of primary tumor.  There is no evidence of distant metastatic disease.  No evidence of hypermetabolic metastatic disease in lymph nodes as well.  Patient however continues to have a persistent distal esophageal stricture as noted on the barium swallow.  Patient will be seeing Dr. Servando Snare soon following which decision needs to be made if he would be a candidate for surgery.  Patient will also be seeing cardiology to obtain cardiac clearance in anticipation of surgery.  If plan is to proceed with surgery which  he would likely know in about 6 weeks time, there would be no role for adjuvant chemotherapy after surgery even if there is evidence of residual disease.  Surgery will take care of his esophageal stricture and he may be able to eat normally after surgery  If there is no plan for definitive esophageal surgery in the future-given the presence of persistent stricture-options would be to repeat endoscopy in a few months to see if the stricture opens up more and he is able to take oral intake.  If stricture persists he would need continued dependence on his J-tube versus switching him to a PEG tube at that time  Patient did have some pancytopenia from chemotherapy which has now resolved.  I will see him back in 4-5 weeks time after his surgical assessment   Visit Diagnosis 1. Malignant neoplasm of lower third of esophagus (HCC)   2. S/P jejunostomy ([place])      Dr. Randa Evens, MD, MPH [facility] Pager- [phone] 04/23/2017 2:53 PM

## 2017-04-26 ENCOUNTER — Encounter: Payer: Self-pay | Admitting: Cardiothoracic Surgery

## 2017-04-26 NOTE — Progress Notes (Signed)
[name] Medical Record #[id number] Date of Birth: 1940-05-24  Referring: Sindy Guadeloupe, MD Primary Care: Idelle Crouch, MD  Chief Complaint: Advanced stage esophageal cancer  History of Present Illness:    Casey Acevedo 77 y.o. male has been followed for diagnosis of esophageal cancer.  The patient had  3 month history of dysphagia starting in the late summer .  He underwent endoscopy at the end of September with negative biopsy for malignancy .   December 26 2016 a repeat endoscopy with dilatation was performed .  The patient notes severe chest pain following the dilatation   Repeat scans showed no evidence of perforation.  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  Treated with PICC and TPN.    Patient denies any lifelong problem with reflux, he does note that his 41 year old brother is alive with history of Barrett's Previous surgery includes only laparoscopic cholecystectomy in 2009  Patient has very few in the way of long-term medical problems, notes he takes no prescription medications, has had no previous cardiac history. He smoked briefly while in college but none for more than 50 years.  Denies any alcohol intake for at least the last 25 years.  He is better able to maintain his feeding jejunostomy tube now but is no longer leaking around the tube and skin changes have much improved, is  Patient completed course of chemotherapy December 20, he completed radiation December 24  Patient comes in today to further discuss possible surgical resection following radiation and chemotherapy.  Since she was last seen he feels much better and stronger.  He has been gaining weight slowly.  A follow-up PET scan was recently done and the patient comes in today to review   Wt Readings from Last 3 Encounters:  04/20/17 175 lb 9.6 oz  (79.7 kg)  04/19/17 174 lb 9.6 oz (79.2 kg)  04/11/17 173 lb 4.5 oz (78.6 kg)   Current Activity/ Functional Status:  Patient is independent with mobility/ambulation, transfers, ADL's, IADL's.   Zubrod Score: At the time of surgery this patient's most appropriate activity status/level should be described as: []     0    Normal activity, no symptoms []     1    Restricted in physical strenuous activity but ambulatory, able to do out light work [x]     2    Ambulatory and capable of self care, unable to do work activities, up and about               >50 % of waking hours                              []     3    Only limited self care, in bed greater than 50% of waking hours []     4    Completely disabled, no self care, confined to bed or chair []     5    Moribund   Past Medical History:  Diagnosis Date  . Cancer (HCC)    esophageal  . Dysphagia   . GERD (gastroesophageal reflux disease)  Past Surgical History:  Procedure Laterality Date  . ABDOMINAL HYSTERECTOMY    . CHOLECYSTECTOMY    . ESOPHAGOGASTRODUODENOSCOPY (EGD) WITH PROPOFOL N/A 12/07/2016   Procedure: ESOPHAGOGASTRODUODENOSCOPY (EGD) WITH PROPOFOL;  Surgeon: Jonathon Bellows, MD;  Location: [facility] ENDOSCOPY;  Service: Gastroenterology;  Laterality: N/A;  . ESOPHAGOGASTRODUODENOSCOPY (EGD) WITH PROPOFOL N/A 12/26/2016   Procedure: ESOPHAGOGASTRODUODENOSCOPY (EGD) WITH PROPOFOL WITH DILATION;  Surgeon: Jonathon Bellows, MD;  Location: [facility] ENDOSCOPY;  Service: Gastroenterology;  Laterality: N/A;  . EYE SURGERY    . GASTROJEJUNOSTOMY N/A 01/16/2017   Procedure: LAPROSCOPIC ASSIST FEEDING JEJUNOSTOMY TUBE;  Surgeon: Johnathan Hausen, MD;  Location: [facility] ORS;  Service: General;  Laterality: N/A;  . IR FLUORO GUIDE PORT INSERTION RIGHT  01/10/2017  . IR REPLACE G-TUBE SIMPLE WO FLUORO  03/15/2017  . IR REPLC DUODEN/JEJUNO TUBE PERCUT W/FLUORO  03/05/2017  . PICC LINE INSERTION Right     Family History  Problem Relation Age of Onset  .  Heart failure Father   . Prostate cancer Neg Hx   . Bladder Cancer Neg Hx   . Kidney cancer Neg Hx    Patient spotted at age 89 with complications after gallbladder surgery, mother died at age 22 with a perforated gastric ulcer and history of breast cancer, he has 1 brother at age 33 with alive with heart trouble and diabetes and also known Barrett's esophagus.  One brother died in the [place] in 38 at age 26 of myocardial infarction.  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  No Known Allergies  Current Outpatient Medications  Medication Sig Dispense Refill  . Amino Acids-Protein Hydrolys (FEEDING SUPPLEMENT, PRO-STAT SUGAR FREE 64,) LIQD Place 30 mLs into feeding tube daily. 900 mL 0  . dexamethasone (DECADRON) 4 MG tablet 8 mg by Per J Tube route daily. Start the day after chemo for two days     . hydrocortisone cream-nystatin cream-zinc oxide Apply 1 application 4 (four) times daily topically. (Patient not taking: Reported on 04/05/2017) 4 oz 3  . loperamide (IMODIUM) 2 MG capsule Take 1 capsule (2 mg total) by mouth as needed for diarrhea or loose stools (take two tablests after the first loose stool, then 1 tablet after each loose stool). (Patient not taking: Reported on 04/05/2017)    . morphine (ROXANOL) 20 MG/ML concentrated solution Take 0.25 mLs (5 mg total) by mouth every 4 (four) hours as needed for severe pain. (Patient not taking: Reported on 04/05/2017) 30 mL 0  . Nutritional Supplements (FEEDING SUPPLEMENT, VITAL 1.5 CAL,) LIQD Give vital 1.5 via continuous pump at  62ml/hr for 22 hours.  Flush with 29ml of water before starting tube feeding and after stopping tube feeding.  Provide additional 242ml of water 2 other times during waking hours via J tube. 1540 mL 12  . ondansetron (ZOFRAN-ODT) 4 MG disintegrating tablet TAKE 1 TABLET BY MOUTH EVERY 8 HOURS AS NEEDED FOR NAUSEA AND VOMITING  0  . prochlorperazine (COMPAZINE) 10 MG tablet TAKE 1 TABLET (10 MG TOTAL) BY MOUTH EVERY 6 (SIX) HOURS  AS NEEDED (NAUSEA OR VOMITING).  1   No current facility-administered medications for this visit.    Facility-Administered Medications Ordered in Other Visits  Medication Dose Route Frequency Provider Last Rate Last Dose  . 0.9 %  sodium chloride infusion   Intravenous Once Sindy Guadeloupe, MD      . ondansetron (ZOFRAN) 8 mg, dexamethasone (DECADRON) 10 mg in sodium chloride 0.9 % 50 mL IVPB   Intravenous Once Sindy Guadeloupe, MD        Pertinent items are noted in HPI.   Review of Systems:   Review of Systems  Constitutional: Positive for malaise/fatigue and weight loss. Negative for chills, diaphoresis and fever.  HENT: Negative.   Eyes: Negative.   Respiratory: Negative.   Cardiovascular: Negative for chest pain, palpitations, orthopnea, claudication and PND.  Gastrointestinal: Positive for abdominal pain, constipation, diarrhea, heartburn and nausea. Negative for blood in stool and melena.  Genitourinary: Negative.   Musculoskeletal: Negative.   Skin: Negative.   Neurological: Positive for weakness.  Endo/Heme/Allergies: Negative.   Psychiatric/Behavioral: Negative.     Physical Exam: BP 124/83   Pulse (!) 108   Resp 20   Ht 5\' 9"  (1.753 m)   Wt 174 lb 9.6 oz (79.2 kg)   SpO2 93% Comment: RA  BMI 25.78 kg/m   Wt Readings from Last 3 Encounters:  04/20/17 175 lb 9.6 oz (79.7 kg)  04/19/17 174 lb 9.6 oz (79.2 kg)  04/11/17 173 lb 4.5 oz (78.6 kg)   PHYSICAL EXAMINATION: General appearance: alert, cooperative, appears older than stated age,  cachectic and fatigued Head: Normocephalic, without obvious abnormality, atraumatic Neck: no adenopathy, no carotid bruit, no JVD, supple, symmetrical, trachea midline and thyroid not enlarged, symmetric, no tenderness/mass/nodules Lymph nodes: Cervical, supraclavicular, and axillary nodes normal. Resp: clear to auscultation bilaterally Back: symmetric, no curvature. ROM normal. No CVA tenderness. Cardio: regular rate and rhythm, S1, S2 normal, no murmur, click, rub or gallop GI: Feeding jejunostomy is intact without leakage in the skin around it is much improved Extremities: extremities normal, atraumatic, no cyanosis or edema and Homans sign is negative, no sign of DVT Neurologic: Grossly normal  Diagnostic Studies & Laboratory data:     Recent Radiology Findings:  Dg Esophagus  Result Date: 03/29/2017 CLINICAL DATA:  History of esophageal malignancy status post chemotherapy and radiation therapy. Patient has been unable to eat solid food since October 2018 and only small amounts of thin liquids passed. The patient has vomiting if eating or drinking more than a small amount of thin liquid. Patient uses a G-tube for nurse mint and medication administration. EXAM: ESOPHOGRAM/BARIUM SWALLOW TECHNIQUE: Single contrast examination was performed using thin barium or water soluble. FLUOROSCOPY TIME:  Fluoroscopy Time:  30 seconds Radiation Exposure Index (if provided by the fluoroscopic device): 873 micro Gy per meters square Number of Acquired Spot Images: 5+ 2 video loops COMPARISON:  PET-CT study of October 24th 2018 and barium swallow examination of December 26, 2016. FINDINGS: The patient ingested a small amount of thin barium. The hypopharynx distended well. The cervical esophagus also distended well. The proximal and middle thirds of the esophagus distended well. Approximately 7 cm above the GE junction abrupt long segment tapering of the caliber of the distal esophagus to less than 5 mm was  observed. This only transiently relax but never exceeded approximately 3 mm in diameter. The mid esophagus just proximal to the transition point was mildly dilated. 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 More proximally no abnormality in the esophagus is seen. Electronically Signed   By: David  Martinique M.D.   On: 03/29/2017 10:14   Nm Pet Image Restag (ps) Skull Base To Thigh  Result Date: 04/13/2017 CLINICAL DATA:  Subsequent treatment strategy for esophageal cancer. EXAM: NUCLEAR MEDICINE PET SKULL BASE TO THIGH TECHNIQUE: 13 mCi F-18 FDG was injected intravenously. Full-ring PET imaging was performed from the skull base to thigh after the radiotracer. CT data was obtained and used for attenuation correction and anatomic localization. FASTING BLOOD GLUCOSE:  Value: 97 mg/dl COMPARISON:  01/03/2017 FINDINGS: NECK: No hypermetabolic lymph nodes in the neck. CHEST: Interval decrease and hypermetabolism in the distal esophagus with SUV max = 4.1 today compared to 8.0 previously. Esophageal dilatation has decreased in the interval. Soft tissue fullness in the distal esophagus has also decreased. No hypermetabolic mediastinal or hilar nodes. No suspicious pulmonary nodules on the CT scan. Right Port-A-Cath tip is positioned in the distal SVC. Coronary artery calcification is evident. Atherosclerotic calcification is noted in the wall of the thoracic aorta. ABDOMEN/PELVIS: No abnormal hypermetabolic activity within the liver, pancreas, adrenal glands, or spleen. No hypermetabolic lymph nodes in the abdomen or pelvis. Tiny hyper scratches that tiny hypoattenuating lesion in the dome of the left liver is stable. Gallbladder surgically absent. There is abdominal aortic atherosclerosis without aneurysm. PEG tube tip noted in the stomach. SKELETON: No focal hypermetabolic activity to suggest skeletal metastasis. IMPRESSION: 1. Interval  decrease in soft tissue fullness and hypermetabolism associated with the distal esophageal neoplasm. 2. No evidence for interval development of hypermetabolic metastatic disease in the neck, chest, abdomen, or pelvis. 3. Coronary artery and Aortic Atherosclerois (ICD10-170.0) Electronically Signed   By: Misty Stanley M.D.   On: 04/13/2017 12:03   I have independently reviewed the above radiology studies  and reviewed the findings with the patient.     Nm Pet Image Initial (pi) Skull Base To Thigh  Result Date: 01/03/2017 CLINICAL DATA:  Initial treatment strategy for esophageal cancer. EXAM: NUCLEAR MEDICINE PET SKULL BASE TO THIGH TECHNIQUE: Twelve point for mCi F-18 FDG was injected intravenously. Full-ring PET imaging was performed from the skull base to thigh after the radiotracer. CT data was obtained and used for attenuation correction and anatomic localization. FASTING BLOOD GLUCOSE:  Value: 127 mg/dl COMPARISON:  CT chest abdomen pelvis 12/26/2016. FINDINGS: NECK: No hypermetabolic lymph nodes in the neck. CT images show no acute findings. CHEST: No hypermetabolic mediastinal, hilar or axillary lymph nodes. Distal esophageal mass measures approximately 2.5 x 2.7 cm with an SUV max of 8.0. No adjacent hypermetabolic adenopathy. No hypermetabolic pulmonary nodules. Atherosclerotic calcification of the arterial vasculature, including coronary arteries. Right PICC tip terminates at the SVC RA junction. Heart is mildly enlarged. No pericardial or pleural effusion. Esophagus is dilated throughout its course, to the level of the above-described esophageal mass. ABDOMEN/PELVIS: No abnormal hypermetabolism in the liver, adrenal glands, spleen or pancreas. No hypermetabolic lymph nodes. Subcentimeter low-attenuation lesion in the dome of the liver is too small to characterize. Cholecystectomy. Adrenal glands, kidneys, spleen, pancreas, stomach and bowel are grossly unremarkable. Atherosclerotic calcification  of the arterial vasculature without aneurysm. Prostate is mildly enlarged. No free fluid. SKELETON: No abnormal osseous hypermetabolism. IMPRESSION: 1. Hypermetabolic distal esophageal mass with associated esophageal dilatation. No hypermetabolic adenopathy or distant metastatic disease. 2. Aortic atherosclerosis (ICD10-170.0). Coronary artery calcification. Electronically Signed   By: Lorin Picket M.D.   On:  01/03/2017 14:44   Ct Chest Wo Contrast/ Ct Abdomen Wo Contrast  Result Date: 12/26/2016 CLINICAL DATA:  Esophageal cancer. Gastroesophageal reflux disease. Dysphagia. Endoscopy from earlier today demonstrating esophageal stenosis at approximately 40 cm from the incisors. Rule out perforation of esophagus. EXAM: CT CHEST, ABDOMEN WITH CONTRAST TECHNIQUE: Multidetector CT imaging of the chest, abdomen was performed following the standard protocol during bolus administration of intravenous contrast. CONTRAST:  75 cc of Isovue-300 COMPARISON:  Esophagram of earlier today.  Prior CTs of 12/11/2016. FINDINGS: CT CHEST FINDINGS Cardiovascular: Aortic and branch vessel atherosclerosis. Normal heart size, without pericardial effusion. Multivessel coronary artery atherosclerosis. A right-sided PICC line terminates at the high right atrium. Mediastinum/Nodes: No supraclavicular adenopathy. No mediastinal or definite hilar adenopathy, given limitations of unenhanced CT. Soft tissue fullness at the distal esophagus including on image 50/series 2. This is similar. The more proximal esophagus is dilated and contrast filled from today's esophagram. No extraluminal contrast identified. No pneumomediastinum. Lungs/Pleura: No pleural fluid. Right upper lobe subpleural calcified granuloma on image 65/series 4. 5 mm lingular nodule on image 97/series 4 is is similar to on the prior. Minimal subpleural left lower lobe nodularity at 3 mm on image 127/series 4. Not readily apparent on the prior, favored to represent a  subpleural lymph node. Musculoskeletal: No acute osseous abnormality. CT ABDOMEN  FINDINGS Hepatobiliary: High left hepatic lobe subcentimeter low-density lesion is likely a cyst. Cholecystectomy, without biliary ductal dilatation. Pancreas: Pancreatic atrophy, without duct dilatation or dominant mass. Spleen: Old granulomatous disease in the spleen. Adrenals/Urinary Tract: Normal adrenal glands. No renal calculi or hydronephrosis. Stomach/Bowel: Normal appearance of the stomach. Normal abdominal bowel loops. Vascular/Lymphatic: Aortic and branch vessel atherosclerosis. Gastrohepatic ligament adenopathy is again identified, including at 1.3 cm on image 53/series 2. Other:  No ascites.  No abdominal peritoneal or omental metastasis. Musculoskeletal: No acute osseous abnormality. IMPRESSION: 1. Soft tissue fullness in the distal esophagus, likely representing the primary site of malignancy. This is at least partially obstructive, with contrast from today's esophagram identified within a dilated more superior esophagus. 2. Isolated gastrohepatic ligament adenopathy, highly suspicious for metastatic disease. 3. No evidence of esophageal perforation. 4. Nonspecific lingular nodule, similar. 5. Coronary artery atherosclerosis. Aortic Atherosclerosis (ICD10-I70.0). Electronically Signed   By: Abigail Miyamoto M.D.   On: 12/26/2016 14:58   Ct Chest W Contrast Ct Abdomen Pelvis W Contrast  Result Date: 12/11/2016 CLINICAL DATA:  Patient with progressive difficulty swallowing. EXAM: CT CHEST, ABDOMEN, AND PELVIS WITH CONTRAST TECHNIQUE: Multidetector CT imaging of the chest, abdomen and pelvis was performed following the standard protocol during bolus administration of intravenous contrast. CONTRAST:  173mL ISOVUE-300 IOPAMIDOL (ISOVUE-300) INJECTION 61% COMPARISON:  CT abdomen pelvis 05/11/2006 FINDINGS: CT CHEST FINDINGS Cardiovascular: Normal heart size. Coronary arterial vascular calcifications. Thoracic aortic vascular  calcifications. Right upper extremity PICC line tip terminates in the superior vena cava. Mediastinum/Nodes: No enlarged axillary, mediastinal or hilar lymphadenopathy. There is wall thickening and narrowing of the distal esophagus (image 50; series 2). Lungs/Pleura: There is a 7 mm subpleural nodule in the lingula (image 96; series 4). Calcified granuloma within the medial right upper lobe (image 63; series 4). Dependent atelectasis/ scarring within the lower lobes bilaterally. No pleural effusion or pneumothorax. Musculoskeletal: Thoracic spine degenerative changes. No aggressive or acute appearing osseous lesions. CT ABDOMEN PELVIS FINDINGS Hepatobiliary: The liver is normal in size and contour. Subcentimeter too small to characterize low-attenuation lesion left hepatic lobe (image 50 ; series 2). Fatty deposition adjacent to the falciform ligament.  Status post cholecystectomy. No intrahepatic or extrahepatic biliary ductal dilatation. Pancreas: Mildly atrophic. Spleen: Calcified granulomas in the spleen. Adrenals/Urinary Tract: The adrenal glands are normal. Kidneys enhance symmetrically with contrast. No hydronephrosis. Urinary bladder is unremarkable. Stomach/Bowel: Colon is decompressed, limiting evaluation. Suggestion of mild colonic wall thickening. Normal morphology of the stomach. There is a 1.3 cm soft tissue nodule adjacent to the GE junction (image 53; series 2). Vascular/Lymphatic: Normal caliber abdominal aorta. Peripheral calcified atherosclerotic plaque. No retroperitoneal lymphadenopathy. Reproductive: Prostate unremarkable. Other: Bilateral fat containing inguinal hernias. Musculoskeletal: Lumbar spine degenerative changes. No aggressive or acute appearing osseous lesions. IMPRESSION: There is a 1.3 cm soft tissue nodule adjacent to the GE junction. This is favored to represent an enlarged lymph node, potentially reactive or metastatic in etiology. The colon is decompressed however there is  suggestion of wall thickening, raising the possibility of colitis. There is a 7 mm nodule within the left upper lobe. Non-contrast chest CT at 6-12 months is recommended. If the nodule is stable at time of repeat CT, then future CT at 18-24 months (from today's scan) is considered optional for low-risk patients, but is recommended for high-risk patients. This recommendation follows the consensus statement: Guidelines for Management of Incidental Pulmonary Nodules Detected on CT Images: From the Fleischner Society 2017; Radiology 2017; 284:228-243. Narrowing and wall thickening of the distal esophagus, compatible with known distal esophageal stricture. Electronically Signed   By: Lovey Newcomer M.D.   On: 12/11/2016 20:02   Dg Esophagus  Result Date: 12/06/2016 CLINICAL DATA:  Difficulty swallowing, constant vomiting EXAM: ESOPHOGRAM/BARIUM SWALLOW TECHNIQUE: Single contrast examination was performed using  thick barium. FLUOROSCOPY TIME:  Fluoroscopy Time:  0.5 minute Radiation Exposure Index (if provided by the fluoroscopic device): 5.7 mGy Number of Acquired Spot Images: 0 COMPARISON:  None. FINDINGS: There was normal pharyngeal anatomy and motility. Patient is extremely nauseous. Mild esophageal dilatation with a high-grade stricture of the distal esophagus with irregular margins. IMPRESSION: 1. 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 Recommend upper endoscopy. Electronically Signed   By: Kathreen Devoid   On: 12/06/2016 11:53   Dg Esophagus W/water Sol Cm  Result Date: 12/26/2016 CLINICAL DATA:  Recent esophageal instrumentation. Pain. Evaluate for esophageal rupture. EXAM: ESOPHOGRAM/BARIUM SWALLOW TECHNIQUE: Single contrast examination was performed using water-soluble contrast. FLUOROSCOPY TIME:  1 minutes and 6 seconds COMPARISON:  None. FINDINGS: The patient ingested water-soluble  contrast and imaging was obtained. There is an obstruction of the distal esophagus, just above the gastroesophageal junction. A fluid level exists in the esophagus. Contrast never traversed beyond the distal esophagus. The patient vomited. There is no evidence of extravasation to suggest leak or rupture. IMPRESSION: No evidence of contrast extravasation to suggest esophageal injury or perforation There is abrupt obstruction of the distal esophagus just above the gastroesophageal junction. Electronically Signed   By: Marybelle Killings M.D.   On: 12/26/2016 14:46     I have independently reviewed the above radiology studies  and reviewed the findings with the patient.   Recent Lab Findings: Lab Results  Component Value Date   WBC 6.5 04/20/2017   HGB 13.1 04/20/2017   HCT 39.7 (L) 04/20/2017   PLT 278 04/20/2017   GLUCOSE 102 (H) 04/20/2017   TRIG 92 12/11/2016   ALT 26 04/20/2017   AST 19 04/20/2017   NA 136 04/20/2017   K 3.8 04/20/2017   CL 104 04/20/2017  CREATININE 0.82 04/20/2017   BUN 21 (H) 04/20/2017   CO2 24 04/20/2017   INR 1.04 01/10/2017   Path 12/26/2016  Collected: 12/26/16 1152  Resulting lab: [facility]  Value: Surgical Pathology  CASE: [id number]  PATIENT: Casey Acevedo  Surgical Pathology Report      SPECIMEN SUBMITTED:  A. 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 ESOPHAGUS STRICTURE; COLD BIOPSY:  - SQUAMOUS CELL CARCINOMA.   Comment:  These findings were communicated to Dr. Vicente Males on 12/27/2016.   GROSS DESCRIPTION:   A. Labeled: esophageal stricture C BX   Tissue fragment(s): 3   Size: 0.1-0.4 cm   Description: pink fragments   Entirely submitted in 1 cassette(s).     Final Diagnosis performed by Quay Burow, MD. Electronically signed  12/27/2016 2:05:42PM     The electronic  signature indicates that the named Attending Pathologist  has evaluated the specimen   Technical component performed at [facility], [address], [place],  [place] [address]  Lab: [phone] Dir: Darrick Penna. Evette Doffing, MD   Professional component performed at [facility],  [address], [place], [place] [address]  Lab: [phone] Dir: Dellia Nims. Rubinas, MD    ENDO:12/26/2016 One severe stenosis was found 38 to 40 cm from the incisors. This measured 6 mm (inner diameter) x 2 cm (in length) and was traversed after downsizing scope and dilating. A guide wire was placed, then the scope was withdrawn. Using the wire as a guide, dilation with an 10-19-08 mm pyloric balloon dilator was performed under fluoroscopic guidance. The dilation site was examined following endoscope reinsertion and showed mild improvement in luminal narrowing. We were unable to pass the adult scope , we subsequently further dilated the stricture with a CRE dilator serially from 10-12 mm and subsequently the adult scope was able to pass through. The stricture still appeared to be very tight and the scope was not able to easily glide past. Biopsies were taken with a cold forceps for histology.     Assessment / Plan:   Patient continues to have a difficult time tolerating chemotherapy and radiation.   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  There is no  evidence for interval development of hypermetabolic metastatic disease in the neck, chest, abdomen, or pelvis.  This was reviewed with the patient currently there is no oncologic impediment to consider surgical resection.  Patient has no  evidence on recent PET scan of distant metastatic disease.  I discussed this with him and recommended he return to see me in approximately 3 weeks.  If he continues to gain strength and weight will consider proceeding with esophagectomy.  . 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

## 2017-05-10 ENCOUNTER — Other Ambulatory Visit: Payer: Self-pay

## 2017-05-10 ENCOUNTER — Ambulatory Visit (INDEPENDENT_AMBULATORY_CARE_PROVIDER_SITE_OTHER): Payer: Medicare Other | Admitting: Cardiothoracic Surgery

## 2017-05-10 ENCOUNTER — Encounter: Payer: Self-pay | Admitting: Cardiothoracic Surgery

## 2017-05-10 VITALS — BP 111/72 | HR 104 | Resp 18 | Ht 69.0 in | Wt 180.0 lb

## 2017-05-10 DIAGNOSIS — C155 Malignant neoplasm of lower third of esophagus: Secondary | ICD-10-CM | POA: Diagnosis not present

## 2017-05-10 NOTE — Progress Notes (Signed)
[address]       [place] [address]             [phone]                    Endrit D Grivas [place] Medical Record #[id number] Date of Birth: 06-Oct-1940  Referring: Berton Lan, MD Primary Care: Idelle Crouch, MD  Chief Complaint: Advanced stage esophageal cancer  History of Present Illness:    CHESKY HEYER 77 y.o. male has been followed for diagnosis of esophageal cancer.  The patient had  3 month history of dysphagia starting in the late summer .  He underwent endoscopy at the end of September with negative biopsy for malignancy .   December 26 2016 a repeat endoscopy with dilatation was performed .  The patient notes severe chest pain following the dilatation   Repeat scans showed no evidence of perforation.  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  Treated with PICC and TPN.    Patient denies any lifelong problem with reflux, he does note that his 84 year old brother is alive with history of Barrett's Previous surgery includes only laparoscopic cholecystectomy in 2009  Patient has very few in the way of long-term medical problems, notes he takes no prescription medications, has had no previous cardiac history. He smoked briefly while in college but none for more than 50 years.  Denies any alcohol intake for at least the last 25 years.  He is better able to maintain his feeding jejunostomy tube now but is no longer leaking around the tube and skin changes have much improved, is  Patient completed course of chemotherapy December 20, he completed radiation December 24  Patient comes in today to further discuss possible surgical resection following radiation and chemotherapy.  Follow-up PET scan was done 3 weeks ago He continues to gain weight and strength since seen 3 weeks ago.  Wt Readings from Last 3 Encounters:  05/10/17 180 lb (81.6 kg)  04/20/17 175 lb 9.6 oz (79.7 kg)  04/19/17 174 lb 9.6 oz (79.2 kg)    Current Activity/ Functional Status:  Patient is independent with mobility/ambulation, transfers, ADL's, IADL's.   Zubrod Score: At the time of surgery this patient's most appropriate activity status/level should be described as: []     0    Normal activity, no symptoms []     1    Restricted in physical strenuous activity but ambulatory, able to do out light work [x]     2    Ambulatory and capable of self care, unable to do work activities, up and about               >50 % of waking hours                              []     3    Only limited self care, in bed greater than 50% of waking hours []     4    Completely disabled, no self care, confined to bed or chair []     5    Moribund   Past Medical History:  Diagnosis Date  . Cancer (HCC)    esophageal  . Dysphagia   . GERD (gastroesophageal reflux disease)     Past Surgical History:  Procedure Laterality Date  . ABDOMINAL HYSTERECTOMY    . CHOLECYSTECTOMY    .  ESOPHAGOGASTRODUODENOSCOPY (EGD) WITH PROPOFOL N/A 12/07/2016   Procedure: ESOPHAGOGASTRODUODENOSCOPY (EGD) WITH PROPOFOL;  Surgeon: Jonathon Bellows, MD;  Location: [facility] ENDOSCOPY;  Service: Gastroenterology;  Laterality: N/A;  . ESOPHAGOGASTRODUODENOSCOPY (EGD) WITH PROPOFOL N/A 12/26/2016   Procedure: ESOPHAGOGASTRODUODENOSCOPY (EGD) WITH PROPOFOL WITH DILATION;  Surgeon: Jonathon Bellows, MD;  Location: [facility] ENDOSCOPY;  Service: Gastroenterology;  Laterality: N/A;  . EYE SURGERY    . GASTROJEJUNOSTOMY N/A 01/16/2017   Procedure: LAPROSCOPIC ASSIST FEEDING JEJUNOSTOMY TUBE;  Surgeon: Johnathan Hausen, MD;  Location: [facility] ORS;  Service: General;  Laterality: N/A;  . IR FLUORO GUIDE PORT INSERTION RIGHT  01/10/2017  . IR REPLACE G-TUBE SIMPLE WO FLUORO  03/15/2017  . IR REPLC DUODEN/JEJUNO TUBE PERCUT W/FLUORO  03/05/2017  . PICC LINE INSERTION Right     Family History  Problem Relation Age of Onset  . Heart failure Father   . Prostate cancer Neg Hx   . Bladder Cancer Neg Hx   .  Kidney cancer Neg Hx    Patient spotted at age 77 with complications after gallbladder surgery, mother died at age 7 with a perforated gastric ulcer and history of breast cancer, he has 1 brother at age 51 with alive with heart trouble and diabetes and also known Barrett's esophagus.  One brother died in the [place] in 85 at age 31 of myocardial infarction.  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  No Known Allergies  Current Outpatient Medications  Medication Sig Dispense Refill  . Amino Acids-Protein Hydrolys (FEEDING SUPPLEMENT, PRO-STAT SUGAR FREE 64,) LIQD Place 30 mLs into feeding tube daily. 900 mL 0  . hydrocortisone cream-nystatin cream-zinc oxide Apply 1 application 4 (four) times daily topically. 4 oz 3  . loperamide (IMODIUM) 2 MG capsule Take 1 capsule (2 mg total) by mouth as needed for diarrhea or loose stools (take two tablests after the first loose stool, then 1 tablet after each loose stool).    . Nutritional Supplements (FEEDING SUPPLEMENT, VITAL 1.5 CAL,) LIQD Give vital 1.5 via continuous pump at 67ml/hr for 22 hours.  Flush with 248ml of water before starting tube feeding and after stopping tube feeding.  Provide additional 243ml of water 2 other times during waking hours via J tube. 1540 mL 12   No current facility-administered medications for this visit.    Facility-Administered Medications Ordered in Other Visits  Medication Dose Route Frequency Provider Last Rate Last Dose  . 0.9 %  sodium chloride infusion   Intravenous Once Sindy Guadeloupe, MD        . ondansetron (ZOFRAN) 8 mg, dexamethasone (DECADRON) 10 mg in sodium chloride 0.9 % 50 mL IVPB   Intravenous Once Sindy Guadeloupe, MD        Pertinent items are noted in HPI.   Review of Systems: Review of systems is reviewed with the patient again and unchanged we notes she has gained weight  Review of Systems  Constitutional: Positive for malaise/fatigue and weight loss. Negative for chills, diaphoresis and fever.  HENT: Negative.   Eyes: Negative.  Respiratory: Negative.   Cardiovascular: Negative for chest pain, palpitations, orthopnea, claudication and PND.  Gastrointestinal: Positive for abdominal pain, constipation, diarrhea, heartburn and nausea. Negative for blood in stool and melena.  Genitourinary: Negative.   Musculoskeletal: Negative.   Skin: Negative.   Neurological: Positive for weakness.  Endo/Heme/Allergies: Negative.   Psychiatric/Behavioral: Negative.     Physical Exam: BP 111/72 (BP Location: Right Arm, Patient Position: Sitting, Cuff Size: Large)   Pulse (!) 104   Resp 18   Ht 5\' 9"  (1.753 m)   Wt 180 lb (81.6 kg)   SpO2 95% Comment: RA  BMI 26.58 kg/m   Wt Readings from Last 3 Encounters:  05/10/17 180 lb (81.6 kg)  04/20/17 175 lb 9.6 oz (79.7 kg)  04/19/17 174 lb 9.6 oz (79.2 kg)   PHYSICAL EXAMINATION: General appearance: alert and cooperative Head: Normocephalic, without obvious abnormality, atraumatic Neck: no adenopathy, no carotid bruit, no JVD, supple, symmetrical, trachea midline and thyroid not enlarged, symmetric, no tenderness/mass/nodules Lymph nodes: Cervical, supraclavicular, and axillary nodes normal. Resp: clear to auscultation bilaterally Back: symmetric, no curvature. ROM normal. No CVA tenderness. Cardio: regular rate and rhythm, S1, S2 normal, no murmur, click, rub or gallop GI: soft, non-tender; bowel sounds normal; no masses,  no organomegaly Extremities: extremities normal, atraumatic, no cyanosis or edema Neurologic:  Grossly normal Jejunostomy feeding tube is in place the surrounding irritation is much improved  Diagnostic Studies & Laboratory data:     Recent Radiology Findings:  Dg Esophagus  Result Date: 03/29/2017 CLINICAL DATA:  History of esophageal malignancy status post chemotherapy and radiation therapy. Patient has been unable to eat solid food since October 2018 and only small amounts of thin liquids passed. The patient has vomiting if eating or drinking more than a small amount of thin liquid. Patient uses a G-tube for nurse mint and medication administration. EXAM: ESOPHOGRAM/BARIUM SWALLOW TECHNIQUE: Single contrast examination was performed using thin barium or water soluble. FLUOROSCOPY TIME:  Fluoroscopy Time:  30 seconds Radiation Exposure Index (if provided by the fluoroscopic device): 873 micro Gy per meters square Number of Acquired Spot Images: 5+ 2 video loops COMPARISON:  PET-CT study of October 24th 2018 and barium swallow examination of December 26, 2016. FINDINGS: The patient ingested a small amount of thin barium. The hypopharynx distended well. The cervical esophagus also distended well. The proximal and middle thirds of the esophagus distended well. Approximately 7 cm above the GE junction abrupt long segment tapering of the caliber of the distal esophagus to less than 5 mm was observed. This only transiently relax but never exceeded approximately 3 mm in diameter. The mid esophagus just proximal to the transition point was mildly dilated. 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 More proximally no abnormality in the esophagus is seen. Electronically Signed   By: David  Martinique M.D.   On: 03/29/2017 10:14   Nm Pet Image Restag (ps) Skull Base To Thigh  Result Date: 04/13/2017 CLINICAL DATA:  Subsequent treatment strategy for esophageal cancer. EXAM: NUCLEAR MEDICINE PET SKULL BASE TO THIGH TECHNIQUE: 13 mCi  F-18 FDG was injected intravenously. Full-ring PET imaging was performed from the skull base to thigh after the radiotracer. CT data was obtained and used for attenuation correction and anatomic localization. FASTING BLOOD GLUCOSE:  Value: 97 mg/dl COMPARISON:  01/03/2017 FINDINGS: NECK: No hypermetabolic lymph nodes in the neck. CHEST: Interval decrease and hypermetabolism in the distal esophagus with SUV max =  4.1 today compared to 8.0 previously. Esophageal dilatation has decreased in the interval. Soft tissue fullness in the distal esophagus has also decreased. No hypermetabolic mediastinal or hilar nodes. No suspicious pulmonary nodules on the CT scan. Right Port-A-Cath tip is positioned in the distal SVC. Coronary artery calcification is evident. Atherosclerotic calcification is noted in the wall of the thoracic aorta. ABDOMEN/PELVIS: No abnormal hypermetabolic activity within the liver, pancreas, adrenal glands, or spleen. No hypermetabolic lymph nodes in the abdomen or pelvis. Tiny hyper scratches that tiny hypoattenuating lesion in the dome of the left liver is stable. Gallbladder surgically absent. There is abdominal aortic atherosclerosis without aneurysm. PEG tube tip noted in the stomach. SKELETON: No focal hypermetabolic activity to suggest skeletal metastasis. IMPRESSION: 1. Interval decrease in soft tissue fullness and hypermetabolism associated with the distal esophageal neoplasm. 2. No evidence for interval development of hypermetabolic metastatic disease in the neck, chest, abdomen, or pelvis. 3. Coronary artery and Aortic Atherosclerois (ICD10-170.0) Electronically Signed   By: Misty Stanley M.D.   On: 04/13/2017 12:03   I have independently reviewed the above radiology studies  and reviewed the findings with the patient.     Nm Pet Image Initial (pi) Skull Base To Thigh  Result Date: 01/03/2017 CLINICAL DATA:  Initial treatment strategy for esophageal cancer. EXAM: NUCLEAR MEDICINE PET  SKULL BASE TO THIGH TECHNIQUE: Twelve point for mCi F-18 FDG was injected intravenously. Full-ring PET imaging was performed from the skull base to thigh after the radiotracer. CT data was obtained and used for attenuation correction and anatomic localization. FASTING BLOOD GLUCOSE:  Value: 127 mg/dl COMPARISON:  CT chest abdomen pelvis 12/26/2016. FINDINGS: NECK: No hypermetabolic lymph nodes in the neck. CT images show no acute findings. CHEST: No hypermetabolic mediastinal, hilar or axillary lymph nodes. Distal esophageal mass measures approximately 2.5 x 2.7 cm with an SUV max of 8.0. No adjacent hypermetabolic adenopathy. No hypermetabolic pulmonary nodules. Atherosclerotic calcification of the arterial vasculature, including coronary arteries. Right PICC tip terminates at the SVC RA junction. Heart is mildly enlarged. No pericardial or pleural effusion. Esophagus is dilated throughout its course, to the level of the above-described esophageal mass. ABDOMEN/PELVIS: No abnormal hypermetabolism in the liver, adrenal glands, spleen or pancreas. No hypermetabolic lymph nodes. Subcentimeter low-attenuation lesion in the dome of the liver is too small to characterize. Cholecystectomy. Adrenal glands, kidneys, spleen, pancreas, stomach and bowel are grossly unremarkable. Atherosclerotic calcification of the arterial vasculature without aneurysm. Prostate is mildly enlarged. No free fluid. SKELETON: No abnormal osseous hypermetabolism. IMPRESSION: 1. Hypermetabolic distal esophageal mass with associated esophageal dilatation. No hypermetabolic adenopathy or distant metastatic disease. 2. Aortic atherosclerosis (ICD10-170.0). Coronary artery calcification. Electronically Signed   By: Lorin Picket M.D.   On: 01/03/2017 14:44   Ct Chest Wo Contrast/ Ct Abdomen Wo Contrast  Result Date: 12/26/2016 CLINICAL DATA:  Esophageal cancer. Gastroesophageal reflux disease. Dysphagia. Endoscopy from earlier today  demonstrating esophageal stenosis at approximately 40 cm from the incisors. Rule out perforation of esophagus. EXAM: CT CHEST, ABDOMEN WITH CONTRAST TECHNIQUE: Multidetector CT imaging of the chest, abdomen was performed following the standard protocol during bolus administration of intravenous contrast. CONTRAST:  75 cc of Isovue-300 COMPARISON:  Esophagram of earlier today.  Prior CTs of 12/11/2016. FINDINGS: CT CHEST FINDINGS Cardiovascular: Aortic and branch vessel atherosclerosis. Normal heart size, without pericardial effusion. Multivessel coronary artery atherosclerosis. A right-sided PICC line terminates at the high right atrium. Mediastinum/Nodes: No supraclavicular adenopathy. No mediastinal or definite hilar adenopathy, given limitations of  unenhanced CT. Soft tissue fullness at the distal esophagus including on image 50/series 2. This is similar. The more proximal esophagus is dilated and contrast filled from today's esophagram. No extraluminal contrast identified. No pneumomediastinum. Lungs/Pleura: No pleural fluid. Right upper lobe subpleural calcified granuloma on image 65/series 4. 5 mm lingular nodule on image 97/series 4 is is similar to on the prior. Minimal subpleural left lower lobe nodularity at 3 mm on image 127/series 4. Not readily apparent on the prior, favored to represent a subpleural lymph node. Musculoskeletal: No acute osseous abnormality. CT ABDOMEN  FINDINGS Hepatobiliary: High left hepatic lobe subcentimeter low-density lesion is likely a cyst. Cholecystectomy, without biliary ductal dilatation. Pancreas: Pancreatic atrophy, without duct dilatation or dominant mass. Spleen: Old granulomatous disease in the spleen. Adrenals/Urinary Tract: Normal adrenal glands. No renal calculi or hydronephrosis. Stomach/Bowel: Normal appearance of the stomach. Normal abdominal bowel loops. Vascular/Lymphatic: Aortic and branch vessel atherosclerosis. Gastrohepatic ligament adenopathy is again  identified, including at 1.3 cm on image 53/series 2. Other:  No ascites.  No abdominal peritoneal or omental metastasis. Musculoskeletal: No acute osseous abnormality. IMPRESSION: 1. Soft tissue fullness in the distal esophagus, likely representing the primary site of malignancy. This is at least partially obstructive, with contrast from today's esophagram identified within a dilated more superior esophagus. 2. Isolated gastrohepatic ligament adenopathy, highly suspicious for metastatic disease. 3. No evidence of esophageal perforation. 4. Nonspecific lingular nodule, similar. 5. Coronary artery atherosclerosis. Aortic Atherosclerosis (ICD10-I70.0). Electronically Signed   By: Abigail Miyamoto M.D.   On: 12/26/2016 14:58   Ct Chest W Contrast Ct Abdomen Pelvis W Contrast  Result Date: 12/11/2016 CLINICAL DATA:  Patient with progressive difficulty swallowing. EXAM: CT CHEST, ABDOMEN, AND PELVIS WITH CONTRAST TECHNIQUE: Multidetector CT imaging of the chest, abdomen and pelvis was performed following the standard protocol during bolus administration of intravenous contrast. CONTRAST:  164mL ISOVUE-300 IOPAMIDOL (ISOVUE-300) INJECTION 61% COMPARISON:  CT abdomen pelvis 05/11/2006 FINDINGS: CT CHEST FINDINGS Cardiovascular: Normal heart size. Coronary arterial vascular calcifications. Thoracic aortic vascular calcifications. Right upper extremity PICC line tip terminates in the superior vena cava. Mediastinum/Nodes: No enlarged axillary, mediastinal or hilar lymphadenopathy. There is wall thickening and narrowing of the distal esophagus (image 50; series 2). Lungs/Pleura: There is a 7 mm subpleural nodule in the lingula (image 96; series 4). Calcified granuloma within the medial right upper lobe (image 63; series 4). Dependent atelectasis/ scarring within the lower lobes bilaterally. No pleural effusion or pneumothorax. Musculoskeletal: Thoracic spine degenerative changes. No aggressive or acute appearing osseous  lesions. CT ABDOMEN PELVIS FINDINGS Hepatobiliary: The liver is normal in size and contour. Subcentimeter too small to characterize low-attenuation lesion left hepatic lobe (image 50 ; series 2). Fatty deposition adjacent to the falciform ligament. Status post cholecystectomy. No intrahepatic or extrahepatic biliary ductal dilatation. Pancreas: Mildly atrophic. Spleen: Calcified granulomas in the spleen. Adrenals/Urinary Tract: The adrenal glands are normal. Kidneys enhance symmetrically with contrast. No hydronephrosis. Urinary bladder is unremarkable. Stomach/Bowel: Colon is decompressed, limiting evaluation. Suggestion of mild colonic wall thickening. Normal morphology of the stomach. There is a 1.3 cm soft tissue nodule adjacent to the GE junction (image 53; series 2). Vascular/Lymphatic: Normal caliber abdominal aorta. Peripheral calcified atherosclerotic plaque. No retroperitoneal lymphadenopathy. Reproductive: Prostate unremarkable. Other: Bilateral fat containing inguinal hernias. Musculoskeletal: Lumbar spine degenerative changes. No aggressive or acute appearing osseous lesions. IMPRESSION: There is a 1.3 cm soft tissue nodule adjacent to the GE junction. This is favored to represent an enlarged lymph node, potentially reactive or  metastatic in etiology. The colon is decompressed however there is suggestion of wall thickening, raising the possibility of colitis. There is a 7 mm nodule within the left upper lobe. Non-contrast chest CT at 6-12 months is recommended. If the nodule is stable at time of repeat CT, then future CT at 18-24 months (from today's scan) is considered optional for low-risk patients, but is recommended for high-risk patients. This recommendation follows the consensus statement: Guidelines for Management of Incidental Pulmonary Nodules Detected on CT Images: From the Fleischner Society 2017; Radiology 2017; 284:228-243. Narrowing and wall thickening of the distal esophagus, compatible  with known distal esophageal stricture. Electronically Signed   By: Lovey Newcomer M.D.   On: 12/11/2016 20:02   Dg Esophagus  Result Date: 12/06/2016 CLINICAL DATA:  Difficulty swallowing, constant vomiting EXAM: ESOPHOGRAM/BARIUM SWALLOW TECHNIQUE: Single contrast examination was performed using  thick barium. FLUOROSCOPY TIME:  Fluoroscopy Time:  0.5 minute Radiation Exposure Index (if provided by the fluoroscopic device): 5.7 mGy Number of Acquired Spot Images: 0 COMPARISON:  None. FINDINGS: There was normal pharyngeal anatomy and motility. Patient is extremely nauseous. Mild esophageal dilatation with a high-grade stricture of the distal esophagus with irregular margins. IMPRESSION: 1. 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 Recommend upper endoscopy. Electronically Signed   By: Kathreen Devoid   On: 12/06/2016 11:53   Dg Esophagus W/water Sol Cm  Result Date: 12/26/2016 CLINICAL DATA:  Recent esophageal instrumentation. Pain. Evaluate for esophageal rupture. EXAM: ESOPHOGRAM/BARIUM SWALLOW TECHNIQUE: Single contrast examination was performed using water-soluble contrast. FLUOROSCOPY TIME:  1 minutes and 6 seconds COMPARISON:  None. FINDINGS: The patient ingested water-soluble contrast and imaging was obtained. There is an obstruction of the distal esophagus, just above the gastroesophageal junction. A fluid level exists in the esophagus. Contrast never traversed beyond the distal esophagus. The patient vomited. There is no evidence of extravasation to suggest leak or rupture. IMPRESSION: No evidence of contrast extravasation to suggest esophageal injury or perforation There is abrupt obstruction of the distal esophagus just above the gastroesophageal junction. Electronically Signed   By: Marybelle Killings M.D.   On: 12/26/2016 14:46     I have independently reviewed the above radiology  studies  and reviewed the findings with the patient.   Recent Lab Findings: Lab Results  Component Value Date   WBC 6.5 04/20/2017   HGB 13.1 04/20/2017   HCT 39.7 (L) 04/20/2017   PLT 278 04/20/2017   GLUCOSE 102 (H) 04/20/2017   TRIG 92 12/11/2016   ALT 26 04/20/2017   AST 19 04/20/2017   NA 136 04/20/2017   K 3.8 04/20/2017   CL 104 04/20/2017   CREATININE 0.82 04/20/2017   BUN 21 (H) 04/20/2017   CO2 24 04/20/2017   INR 1.04 01/10/2017   Path 12/26/2016  Collected: 12/26/16 1152  Resulting lab: [id number]  Value: Surgical Pathology  CASE: [id number]  PATIENT: Eliseo Gum  Surgical Pathology Report      SPECIMEN SUBMITTED:  A. 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 ESOPHAGUS STRICTURE; COLD BIOPSY:  - SQUAMOUS CELL CARCINOMA.   Comment:  These findings were communicated to Dr. Vicente Males on 12/27/2016.   GROSS DESCRIPTION:   A. Labeled: esophageal stricture C BX   Tissue fragment(s): 3  Size: 0.1-0.4 cm   Description: pink fragments   Entirely submitted in 1 cassette(s).     Final Diagnosis performed by Quay Burow, MD. Electronically signed  12/27/2016 2:05:42PM     The electronic signature indicates that the named Attending Pathologist  has evaluated the specimen   Technical component performed at [facility], [address] [place],  [place] [address]  Lab: [phone] Dir: Darrick Penna. Evette Doffing, MD   Professional component performed at [facility], [facility],  [place], [place], [place] [address]  Lab: [phone] Dir: Dellia Nims. Rubinas, MD    ENDO:12/26/2016 One severe stenosis was found 38 to 40 cm from the incisors. This measured 6 mm (inner diameter) x 2 cm (in length) and was traversed after downsizing scope and dilating. A guide wire was placed, then the  scope was withdrawn. Using the wire as a guide, dilation with an 10-19-08 mm pyloric balloon dilator was performed under fluoroscopic guidance. The dilation site was examined following endoscope reinsertion and showed mild improvement in luminal narrowing. We were unable to pass the adult scope , we subsequently further dilated the stricture with a CRE dilator serially from 10-12 mm and subsequently the adult scope was able to pass through. The stricture still appeared to be very tight and the scope was not able to easily glide past. Biopsies were taken with a cold forceps for histology.     Assessment / Plan:   Patient continues to have a difficult time tolerating chemotherapy and radiation.   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  There is no  evidence for interval development of hypermetabolic metastatic disease in the neck, chest, abdomen, or pelvis.   Patient has a appointment with cardiology March 20 for preop clearance, will see if he could be seen sooner Soon his cardiac clearance is complete we will make arrangements for esophagectomy.   Grace Isaac MD      [address] [place] [address] Office [phone]   Beeper [phone]  05/10/2017 3:51 PM

## 2017-05-17 ENCOUNTER — Telehealth: Payer: Self-pay

## 2017-05-17 NOTE — Telephone Encounter (Signed)
Nutrition Follow-up:  Spoke with patient via phone for nutrition follow-up.    Patient reports that he is doing well with vital 1.5, 4.5 cartons per day via continuous pump.  Patient reports that he is giving water flushes and does not feel dehydrated.  Reports that he continues to drink 2 ensure plus daily orally.  Reports that he feels 100% better than 1 month ago.  Tube is functioning well.  Reports that he is walking about 20 minutes 2 times per day and going with wife out and about.  Reports that he is sleeping fairly good at night.    Reports that he is planning to meet with cardiology (surgery clearance) and Dr. Janese Banks for follow up.  Reports that Dr. Servando Snare is planning surgery maybe mid April.     Anthropometrics:   Noted weight per chart back up to 180 lb, starting weight   Estimated Energy Needs  Kcals: 2050-2460 calories/d Protein: 102-123 g/d Fluid: 2.4 L/d  NUTRITION DIAGNOSIS: Inadequate oral intake continues   MALNUTRITION DIAGNOSIS: Moderate malnutrition resolved with tube feeding and weight gain back to beginning weight   INTERVENTION:   Recommend patient continue with vital 1.5, 4.5 cartons per day via continuous pump.  Continue water flush of 179ml 4 times per day with increase in oral water intake of 2, 8 oz cups per day.   Continue to drink 2 ensure plus per day for additional calories and free water.  Will provide patient with additional complimentary case  of ensure plus on March 8 MD visit.   Tube feeding plus ensure plus and water providing 2297 calories, 98 g of protein and 2118ml water (formula, ensure plus, water flush).      MONITORING, EVALUATION, GOAL: weight trends, TF tolerance, po intake   NEXT VISIT: phone follow-up in 1 month. Patient to contact RD with surgery date.  Alias Villagran B. Zenia Resides, [place], [place] Registered Dietitian [phone] (pager)

## 2017-05-18 ENCOUNTER — Encounter: Payer: Self-pay | Admitting: Oncology

## 2017-05-18 ENCOUNTER — Inpatient Hospital Stay: Payer: Medicare Other

## 2017-05-18 ENCOUNTER — Ambulatory Visit (INDEPENDENT_AMBULATORY_CARE_PROVIDER_SITE_OTHER): Payer: Medicare Other | Admitting: Internal Medicine

## 2017-05-18 ENCOUNTER — Encounter: Payer: Self-pay | Admitting: Internal Medicine

## 2017-05-18 ENCOUNTER — Ambulatory Visit: Payer: Medicare Other | Admitting: Internal Medicine

## 2017-05-18 ENCOUNTER — Inpatient Hospital Stay: Payer: Medicare Other | Attending: Oncology | Admitting: Oncology

## 2017-05-18 VITALS — BP 116/78 | HR 99 | Ht 69.0 in | Wt 177.2 lb

## 2017-05-18 VITALS — BP 117/78 | HR 96 | Temp 96.4°F | Resp 18 | Wt 179.0 lb

## 2017-05-18 DIAGNOSIS — Z95828 Presence of other vascular implants and grafts: Secondary | ICD-10-CM

## 2017-05-18 DIAGNOSIS — C155 Malignant neoplasm of lower third of esophagus: Secondary | ICD-10-CM

## 2017-05-18 DIAGNOSIS — I2584 Coronary atherosclerosis due to calcified coronary lesion: Secondary | ICD-10-CM | POA: Diagnosis not present

## 2017-05-18 DIAGNOSIS — K222 Esophageal obstruction: Secondary | ICD-10-CM

## 2017-05-18 DIAGNOSIS — I2510 Atherosclerotic heart disease of native coronary artery without angina pectoris: Secondary | ICD-10-CM | POA: Diagnosis not present

## 2017-05-18 DIAGNOSIS — Z923 Personal history of irradiation: Secondary | ICD-10-CM

## 2017-05-18 DIAGNOSIS — Z9221 Personal history of antineoplastic chemotherapy: Secondary | ICD-10-CM | POA: Diagnosis not present

## 2017-05-18 DIAGNOSIS — Z87891 Personal history of nicotine dependence: Secondary | ICD-10-CM | POA: Diagnosis not present

## 2017-05-18 DIAGNOSIS — Z01810 Encounter for preprocedural cardiovascular examination: Secondary | ICD-10-CM

## 2017-05-18 MED ORDER — HEPARIN SOD (PORK) LOCK FLUSH 100 UNIT/ML IV SOLN
500.0000 [IU] | INTRAVENOUS | Status: AC | PRN
  Administered 2017-05-18: 500 [IU]

## 2017-05-18 MED ORDER — SODIUM CHLORIDE 0.9% FLUSH
10.0000 mL | INTRAVENOUS | Status: AC | PRN
  Administered 2017-05-18: 10 mL
  Filled 2017-05-18: qty 10

## 2017-05-18 NOTE — Progress Notes (Signed)
New Outpatient Visit Date: 05/18/2017  Referring Provider: Lanelle Bal, M.D. [address] [place] [address]  Chief Complaint: Preop risk assessment  HPI:  Mr. Malina is a 77 y.o. male who is being seen today for preoperative cardiovascular risk assessment at the request of Dr. Servando Snare. He has a history of GERD and recently diagnosed esophageal cancer. Mr. Rieth first noted dysphagia in the late summer of 2018. EGD in 11/2016 showed no evidence of malignancy. He underwent repeat endoscopy with esophageal dilation 12/2016. He noted severe chest pain following the procedure, though no perforation was identified on imaging. 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 He was initially treated with parenteral nutrition, though he is now receiving nutrition through a feeding jejunostomy tube. He has been undergoing chemotherapy and radiation with plans for esophagectomy in the near future.  Mr. Nabozny reports feeling well other than some fatigue that he attributes to not being able to eat and drink like normal. He continues to be very active around his house and does not have any symptoms when doing strenuous activity. Specifically, he denies chest pain, shortness of breath, palpitations, lightheadedness, orthopnea, and edema. He denies a history of prior cardiac disease. He believes that he may have had an echo around 5 years ago, but does not recall any details other than that he thinks it was normal.  -------------------------------------------------------------------------------------------------- Cardiovascular History & Procedures: Cardiovascular Problems:  Coronary artery calcification  Risk Factors:  Age greater than 58 and male gender  Cath/PCI:  None  CV Surgery:  None  EP Procedures and Devices:  None  Non-Invasive Evaluation(s):  None  Recent CV Pertinent Labs: Lab Results  Component Value Date   TRIG 92 12/11/2016   INR 1.04 01/10/2017   K 3.8 04/20/2017   MG 1.6 (L) 03/05/2017   BUN 21 (H) 04/20/2017   CREATININE 0.82 04/20/2017    --------------------------------------------------------------------------------------------------  Past Medical History:  Diagnosis Date  . Cancer (HCC)    esophageal  . Dysphagia   . GERD (gastroesophageal reflux disease)     Past Surgical History:  Procedure Laterality Date  . ABDOMINAL HYSTERECTOMY    . CHOLECYSTECTOMY    . ESOPHAGOGASTRODUODENOSCOPY (EGD) WITH PROPOFOL N/A 12/07/2016   Procedure: ESOPHAGOGASTRODUODENOSCOPY (EGD) WITH PROPOFOL;  Surgeon: Jonathon Bellows, MD;  Location[facility] ENDOSCOPY;  Service: Gastroenterology;  Laterality: N/A;  . ESOPHAGOGASTRODUODENOSCOPY (EGD) WITH PROPOFOL N/A 12/26/2016   Procedure: ESOPHAGOGASTRODUODENOSCOPY (EGD) WITH PROPOFOL WITH DILATION;  Surgeon: Jonathon Bellows, MD;  Location: [facility] ENDOSCOPY;  Service: Gastroenterology;  Laterality: N/A;  . EYE SURGERY    . GASTROJEJUNOSTOMY N/A 01/16/2017   Procedure: LAPROSCOPIC ASSIST FEEDING JEJUNOSTOMY TUBE;  Surgeon: Johnathan Hausen, MD;  Location: [facility] ORS;  Service: General;  Laterality: N/A;  . IR FLUORO GUIDE PORT INSERTION RIGHT  01/10/2017  . IR REPLACE G-TUBE SIMPLE WO FLUORO  03/15/2017  . IR REPLC DUODEN/JEJUNO TUBE PERCUT W/FLUORO  03/05/2017  . PICC LINE INSERTION Right     Current Meds  Medication Sig  . Amino Acids-Protein Hydrolys (FEEDING SUPPLEMENT, PRO-STAT SUGAR FREE 64,) LIQD Place 30 mLs into feeding tube daily.  . hydrocortisone cream-nystatin cream-zinc oxide Apply 1 application 4 (four) times daily topically. (Patient not taking: Reported on 05/18/2017)  . loperamide (IMODIUM) 2 MG capsule Take 1 capsule (2 mg total) by mouth as needed for diarrhea or loose stools (take two tablests after the first loose stool, then 1 tablet after each loose stool).  . Nutritional Supplements (  FEEDING SUPPLEMENT, VITAL 1.5 CAL,) LIQD Give vital 1.5 via  continuous pump at 53ml/hr for 22 hours.  Flush with 243ml of water before starting tube feeding and after stopping tube feeding.  Provide additional 2100ml of water 2 other times during waking hours via J tube.    Allergies: Patient has no known allergies.  Social History   Socioeconomic History  . Marital status: Married    Spouse name: Not on file  . Number of children: Not on file  . Years of education: Not on file  . Highest education level: Not on file  Social Needs  . Financial resource strain: Not on file  . Food insecurity - worry: Not on file  . Food insecurity - inability: Not on file  . Transportation needs - medical: Not on file  . Transportation needs - non-medical: Not on file  Occupational History  . Not on file  Tobacco Use  . Smoking status: Never Smoker  . Smokeless tobacco: Never Used  Substance and Sexual Activity  . Alcohol use: No  . Drug use: No  . Sexual activity: Not Currently  Other Topics Concern  . Not on file  Social History Narrative   Independent at baseline. Ambulatory    Family History  Problem Relation Age of Onset  . Heart disease Father   . Diabetes Mother   . Hypertension Mother   . Heart attack Brother 23  . Prostate cancer Neg Hx   . Bladder Cancer Neg Hx   . Kidney cancer Neg Hx     Review of Systems: A 12-system review of systems was performed and was negative except as noted in the HPI.  --------------------------------------------------------------------------------------------------  Physical Exam: BP 116/78   Pulse 99   Ht 5\' 9"  (1.753 m)   Wt 177 lb 3.2 oz (80.4 kg)   BMI 26.17 kg/m   General:  NAD HEENT: No conjunctival pallor or scleral icterus. Moist mucous membranes. OP clear. Neck: Supple without lymphadenopathy, thyromegaly, JVD, or HJR. No carotid bruit. Lungs: Normal work of breathing. Clear to auscultation bilaterally without wheezes or crackles. Heart: Regular rate and rhythm without murmurs, rubs,  or gallops. Non-displaced PMI. Abd: Bowel sounds present. Soft, NT/ND without hepatosplenomegaly Ext: No lower extremity edema. Radial, PT, and DP pulses are 2+ bilaterally Skin: Warm and dry without rash. Neuro: CNIII-XII intact. Strength and fine-touch sensation intact in upper and lower extremities bilaterally. Psych: Normal mood and affect.  I personally observed Mr. Raczkowski climb 4 flights of stairs without difficulty. He did not report angina or significant shortness of breath.  EKG:  NSR with single PVC. Otherwise, no significant abnormalities.  Lab Results  Component Value Date   WBC 6.5 04/20/2017   HGB 13.1 04/20/2017   HCT 39.7 (L) 04/20/2017   MCV 90.0 04/20/2017   PLT 278 04/20/2017    Lab Results  Component Value Date   NA 136 04/20/2017   K 3.8 04/20/2017   CL 104 04/20/2017   CO2 24 04/20/2017   BUN 21 (H) 04/20/2017   CREATININE 0.82 04/20/2017   GLUCOSE 102 (H) 04/20/2017   ALT 26 04/20/2017    Lab Results  Component Value Date   TRIG 92 12/11/2016    --------------------------------------------------------------------------------------------------  ASSESSMENT AND PLAN: Esophageal cancer Mr. Labonte has completed neoadjuvant chemotherapy and radiation. He is planning to undergo esophagectomy with Dr. Servando Snare. Mr. Frankie does not have any symptoms to suggest unstable cardiac disease. His EKG shows an isolated PVC but is otherwise normal.  He is able to perform more than 4 METS of activity without limitations. Based on the NSQIP surgical risk calculator, the patient's risk for perioperative cardiac complications is 1-5%. I believe that he is optimized from a cardiac standpoint and do not recommend additional testing at this time, as it is unlike to further mitigate his perioperative risk.  Coronary artery calcification Incidentally noted on chest CT. Outside lipid panel in 06/2016 was notable for an LDL of 118. Statin therapy could be considered for primary  prevention when Mr. Grabel follows up with his PCP.  Follow-up: Return to clinic as needed.  Nelva Bush, MD 05/18/2017 7:55 PM

## 2017-05-18 NOTE — Patient Instructions (Addendum)
Medication Instructions:  Your physician recommends that you continue on your current medications as directed. Please refer to the Current Medication list given to you today.  -- If you need a refill on your cardiac medications before your next appointment, please call your pharmacy. --  Labwork: None ordered  Testing/Procedures: None ordered  Follow-Up: Your physician wants you to follow-up as needed with Dr. End.    Thank you for choosing [facility]!!    Any Other Special Instructions Will Be Listed Below (If Applicable).         

## 2017-05-21 NOTE — Progress Notes (Signed)
Hematology/Oncology Consult note [facility]  Telephone:([phone] [phone]  Patient Care Team: Idelle Crouch, MD as PCP - General (Internal Medicine) End, Harrell Gave, MD as PCP - Cardiology (Cardiology) Clent Jacks, RN as Registered Nurse Grace Isaac, MD as Consulting Physician (Cardiothoracic Surgery) Sindy Guadeloupe, MD as Consulting Physician (Oncology) Noreene Filbert, MD as Referring Physician (Radiation Oncology)   Name of the patient: Casey Acevedo  [id number]  [birth year]   Date of visit: 05/21/17  Diagnosis-non metastatic esophageal cancer [id number] [facility]   Chief complaint/ Reason for visit-routine f/u of esophageal cancer   Heme/Onc history:1. Patient is a 77 year old gentleman with no significant past medical history and takes no medications he has a remote history of smokingduring his college days and quit smoking thereafter. No history of any alcohol intake. He was recently admitted to the hospital on 12/06/2016 with symptoms of progressive dysphagia which he states has been going on since the starting of September. His dysphagia got to the point that he began to have pain even on swallowing icecream. He underwent EGD at that time which showed but high 2 cm long stricture 2 cm above the GE junction. Biopsies at that time were negative for malignancy. Given that he was unable to eat he was started on TPN at that time and plan was to get a repeat EGD with possible biopsy  2. Patient underwent repeat EGD on 12/26/2016 where he was again noted to have a lower esophageal stricture which reduced the lumen to less than 3-4 mm. There was no evidence of Barrett's esophagus. 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 Shortly after the stricture was dilated the stricture did close up significantly. Postprocedure patient  complained of abdominal pain and there was a concern for perforation and hence a repeat CT abdomen was obtained which did not show any evidence of perforation  3. CT abdomen pelvis as well as CT chest with contrast on 10/01/2018showed: 7 mm sub pleural nodule in the lingula. No evidence of axillary mediastinal or hilar adenopathy.1.3 cm soft tissue nodule adjacent to the GE junctionwhich is favored to represent an enlarged lymph node potentially active or metastatic in etiology. Narrowing involving thickening of the distal esophagus compatible with known distal esophageal stricture  4. He lives with his wife and is independent of his ADLs and IADLs. Besides dysphagia patient reports no loss of appetite or unintentional weight loss over the last few months. He denies any pain  5.[place] been deemed to be a potential surgical candidate in the future. Plan for now is to proceed with concurrent chemoradiation followed by EUS upon completion of treatment given difficulty with the  second EGD  6. Chemo/RT started on 11/9/18and completed on 03/01/17  Interval history- denies any problems with his feeding tube. His swallowing has however not improved and he is only able to take sips of water or other liquids slowly. Unable to eat solid food. Energy levels have improved and he is working on his exercice and stamina in preparation for his surgery. Weight is stable  ECOG PS- 0 Pain scale- 0   Review of systems- Review of Systems  Constitutional: Negative for chills, fever, malaise/fatigue and weight loss.  HENT: Negative for congestion, ear discharge and nosebleeds.   Eyes: Negative for blurred vision.  Respiratory: Negative for cough, hemoptysis,  sputum production, shortness of breath and wheezing.   Cardiovascular: Negative for chest pain, palpitations, orthopnea and claudication.  Gastrointestinal: Negative for abdominal  pain, blood in stool, constipation, diarrhea, heartburn, melena, nausea and vomiting.  Genitourinary: Negative for dysuria, flank pain, frequency, hematuria and urgency.  Musculoskeletal: Negative for back pain, joint pain and myalgias.  Skin: Negative for rash.  Neurological: Negative for dizziness, tingling, focal weakness, seizures, weakness and headaches.  Endo/Heme/Allergies: Does not bruise/bleed easily.  Psychiatric/Behavioral: Negative for depression and suicidal ideas. The patient does not have insomnia.      No Known Allergies   Past Medical History:  Diagnosis Date  . Cancer (HCC)    esophageal  . Dysphagia   . GERD (gastroesophageal reflux disease)      Past Surgical History:  Procedure Laterality Date  . ABDOMINAL HYSTERECTOMY    . CHOLECYSTECTOMY    . ESOPHAGOGASTRODUODENOSCOPY (EGD) WITH PROPOFOL N/A 12/07/2016   Procedure: ESOPHAGOGASTRODUODENOSCOPY (EGD) WITH PROPOFOL;  Surgeon: Jonathon Bellows, MD;  Location: [facility] ENDOSCOPY;  Service: Gastroenterology;  Laterality: N/A;  . ESOPHAGOGASTRODUODENOSCOPY (EGD) WITH PROPOFOL N/A 12/26/2016   Procedure: ESOPHAGOGASTRODUODENOSCOPY (EGD) WITH PROPOFOL WITH DILATION;  Surgeon: Jonathon Bellows, MD;  Location[facility] ENDOSCOPY;  Service: Gastroenterology;  Laterality: N/A;  . EYE SURGERY    . GASTROJEJUNOSTOMY N/A 01/16/2017   Procedure: LAPROSCOPIC ASSIST FEEDING JEJUNOSTOMY TUBE;  Surgeon: Johnathan Hausen, MD;  Location: [facility] ORS;  Service: General;  Laterality: N/A;  . IR FLUORO GUIDE PORT INSERTION RIGHT  01/10/2017  . IR REPLACE G-TUBE SIMPLE WO FLUORO  03/15/2017  . IR REPLC DUODEN/JEJUNO TUBE PERCUT W/FLUORO  03/05/2017  . PICC LINE INSERTION Right     Social History   Socioeconomic History  . Marital status: Married    Spouse name: Not on file  . Number of children: Not on file  . Years of education: Not on file  . Highest education level: Not on file  Social Needs  . Financial resource strain: Not on file  . Food  insecurity - worry: Not on file  . Food insecurity - inability: Not on file  . Transportation needs - medical: Not on file  . Transportation needs - non-medical: Not on file  Occupational History  . Not on file  Tobacco Use  . Smoking status: Never Smoker  . Smokeless tobacco: Never Used  Substance and Sexual Activity  . Alcohol use: No  . Drug use: No  . Sexual activity: Not Currently  Other Topics Concern  . Not on file  Social History Narrative   Independent at baseline. Ambulatory    Family History  Problem Relation Age of Onset  . Heart disease Father   . Diabetes Mother   . Hypertension Mother   . Heart attack Brother 23  . Prostate cancer Neg Hx   . Bladder Cancer Neg Hx   . Kidney cancer Neg Hx      Current Outpatient Medications:  .  Amino Acids-Protein Hydrolys (FEEDING SUPPLEMENT, PRO-STAT SUGAR FREE 64,) LIQD, Place 30 mLs into feeding tube daily., Disp: 900 mL, Rfl: 0 .  loperamide (IMODIUM) 2 MG capsule, Take 1 capsule (2 mg total) by mouth as needed for diarrhea or loose stools (take two tablests after the first loose stool, then 1 tablet after each loose stool)., Disp: , Rfl:  .  Nutritional Supplements (FEEDING SUPPLEMENT, VITAL 1.5 CAL,) LIQD, Give vital 1.5 via continuous pump at 3ml/hr for 22 hours.  Flush with 229ml of water before starting tube feeding and after  stopping tube feeding.  Provide additional 242ml of water 2 other times during waking hours via J tube., Disp: 1540 mL, Rfl: 12 .  hydrocortisone cream-nystatin cream-zinc oxide, Apply 1 application 4 (four) times daily topically. (Patient not taking: Reported on 05/18/2017), Disp: 4 oz, Rfl: 3 No current facility-administered medications for this visit.   Facility-Administered Medications Ordered in Other Visits:  .  0.9 %  sodium chloride infusion, , Intravenous, Once, Sindy Guadeloupe, MD .  ondansetron (ZOFRAN) 8 mg, dexamethasone (DECADRON) 10 mg in sodium chloride 0.9 % 50 mL IVPB, ,  Intravenous, Once, Sindy Guadeloupe, MD  Physical exam:  Vitals:   05/18/17 1445  BP: 117/78  Pulse: 96  Resp: 18  Temp: (!) 96.4 F (35.8 C)  TempSrc: Tympanic  Weight: 179 lb (81.2 kg)   Physical Exam  Constitutional: He is oriented to person, place, and time and well-developed, well-nourished, and in no distress.  HENT:  Head: Normocephalic and atraumatic.  Eyes: EOM are normal. Pupils are equal, round, and reactive to light.  Neck: Normal range of motion.  Cardiovascular: Normal rate, regular rhythm and normal heart sounds.  Pulmonary/Chest: Effort normal and breath sounds normal.  Abdominal: Soft. Bowel sounds are normal.  J tube in place  Neurological: He is alert and oriented to person, place, and time.  Skin: Skin is warm and dry.     CMP Latest Ref Rng & Units 04/20/2017  Glucose 65 - 99 mg/dL 102(H)  BUN 6 - 20 mg/dL 21(H)  Creatinine 0.61 - 1.24 mg/dL 0.82  Sodium 135 - 145 mmol/L 136  Potassium 3.5 - 5.1 mmol/L 3.8  Chloride 101 - 111 mmol/L 104  CO2 22 - 32 mmol/L 24  Calcium 8.9 - 10.3 mg/dL 8.8(L)  Total Protein 6.5 - 8.1 g/dL 7.2  Total Bilirubin 0.3 - 1.2 mg/dL 0.6  Alkaline Phos 38 - 126 U/L 102  AST 15 - 41 U/L 19  ALT 17 - 63 U/L 26   CBC Latest Ref Rng & Units 04/20/2017  WBC 3.8 - 10.6 K/uL 6.5  Hemoglobin 13.0 - 18.0 g/dL 13.1  Hematocrit 40.0 - 52.0 % 39.7(L)  Platelets 150 - 440 K/uL 278     Assessment and plan- Patient is a 77 y.o. male non metastatic esophageal cancer [id number] SCCstatus post 6 cycles of carbotaxol  Patient has completed neoadjuvant chemo/RT. Hypermetabolism in the region of esophageal lesion has decreased but stricture persists. He will be seeing Dr. Servando Snare in a few weeks and will likely proceed with definitive surgery. He has been medically cleared by cardiology for his surgery. I will see him back in 3 months with cbc/ cmp after his surgery   Visit Diagnosis 1. Malignant neoplasm of lower third of esophagus (HCC)   2.  Esophageal stricture      Dr. Randa Evens, MD, MPH [facility] at [facility] Pager- [phone] 05/21/2017 8:18 AM

## 2017-05-30 ENCOUNTER — Ambulatory Visit: Payer: Medicare Other | Admitting: Internal Medicine

## 2017-06-05 DIAGNOSIS — C158 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: Secondary | ICD-10-CM | POA: Insufficient documentation

## 2017-06-05 HISTORY — DX: 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: C15.8

## 2017-06-07 ENCOUNTER — Ambulatory Visit (INDEPENDENT_AMBULATORY_CARE_PROVIDER_SITE_OTHER): Payer: Medicare Other | Admitting: Cardiothoracic Surgery

## 2017-06-07 ENCOUNTER — Encounter: Payer: Self-pay | Admitting: Cardiothoracic Surgery

## 2017-06-07 ENCOUNTER — Other Ambulatory Visit: Payer: Self-pay

## 2017-06-07 VITALS — BP 109/68 | HR 88 | Resp 18 | Ht 69.0 in | Wt 182.8 lb

## 2017-06-07 DIAGNOSIS — C155 Malignant neoplasm of lower third of esophagus: Secondary | ICD-10-CM

## 2017-06-07 NOTE — Progress Notes (Signed)
[address]       [place] [address]             [phone]                    Nash D Soucek [place] Medical Record #[id number] Date of Birth: 1940-07-15  Referring: Sindy Guadeloupe, MD Primary Care: Idelle Crouch, MD  Chief Complaint: Advanced stage esophageal cancer  History of Present Illness:    Casey Acevedo 77 y.o. male has been followed for diagnosis of esophageal cancer.  The patient had  3 month history of dysphagia starting in the late summer .  He underwent endoscopy at the end of September with negative biopsy for malignancy .   December 26 2016 a repeat endoscopy with dilatation was performed .  The patient notes severe chest pain following the dilatation   Repeat scans showed no evidence of perforation.  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  Treated with PICC and TPN.    Patient denies any lifelong problem with reflux, he does note that his 11 year old brother is alive with history of Barrett's Previous surgery includes only laparoscopic cholecystectomy in 2009  Patient has very few in the way of long-term medical problems, notes he takes no prescription medications, has had no previous cardiac history. He smoked briefly while in college but none for more than 50 years.  Denies any alcohol intake for at least the last 25 years.  He is better able to maintain his feeding jejunostomy tube now but is no longer leaking around the tube and skin changes have much improved, is  Patient completed course of chemotherapy December 20, he completed radiation December 24  Patient comes in today to further discuss possible surgical resection following radiation and chemotherapy.  Follow-up PET scan was done February 2019   he continues to gain weight and strength since seen 3 weeks ago.  Patient walks up to several miles a day without difficulty.  Recently was seen by cardiology for clearance prior to considering  esophageal resection  Wt Readings from Last 3 Encounters:  05/18/17 179 lb (81.2 kg)  05/18/17 177 lb 3.2 oz (80.4 kg)  05/10/17 180 lb (81.6 kg)   Current Activity/ Functional Status:  Patient is independent with mobility/ambulation, transfers, ADL's, IADL's.   Zubrod Score: At the time of surgery this patient's most appropriate activity status/level should be described as: []     0    Normal activity, no symptoms []     1    Restricted in physical strenuous activity but ambulatory, able to do out light work [x]     2    Ambulatory and capable of self care, unable to do work activities, up and about               >50 % of waking hours                              []     3    Only limited self care, in bed greater than 50% of waking hours []     4    Completely disabled, no self care, confined to bed or chair []     5    Moribund   Past Medical History:  Diagnosis Date  . Cancer (HCC)    esophageal  . Dysphagia   . GERD (gastroesophageal  reflux disease)     Past Surgical History:  Procedure Laterality Date  . ABDOMINAL HYSTERECTOMY    . CHOLECYSTECTOMY    . ESOPHAGOGASTRODUODENOSCOPY (EGD) WITH PROPOFOL N/A 12/07/2016   Procedure: ESOPHAGOGASTRODUODENOSCOPY (EGD) WITH PROPOFOL;  Surgeon: Jonathon Bellows, MD;  Location: [facility] ENDOSCOPY;  Service: Gastroenterology;  Laterality: N/A;  . ESOPHAGOGASTRODUODENOSCOPY (EGD) WITH PROPOFOL N/A 12/26/2016   Procedure: ESOPHAGOGASTRODUODENOSCOPY (EGD) WITH PROPOFOL WITH DILATION;  Surgeon: Jonathon Bellows, MD;  Location: [facility] ENDOSCOPY;  Service: Gastroenterology;  Laterality: N/A;  . EYE SURGERY    . GASTROJEJUNOSTOMY N/A 01/16/2017   Procedure: LAPROSCOPIC ASSIST FEEDING JEJUNOSTOMY TUBE;  Surgeon: Johnathan Hausen, MD;  Location: [facility] ORS;  Service: General;  Laterality: N/A;  . IR FLUORO GUIDE PORT INSERTION RIGHT  01/10/2017  . IR REPLACE G-TUBE SIMPLE WO FLUORO  03/15/2017  . IR REPLC DUODEN/JEJUNO TUBE PERCUT W/FLUORO  03/05/2017  . PICC LINE  INSERTION Right     Family History  Problem Relation Age of Onset  . Heart disease Father   . Diabetes Mother   . Hypertension Mother   . Heart attack Brother 23  . Prostate cancer Neg Hx   . Bladder Cancer Neg Hx   . Kidney cancer Neg Hx    Patient spotted at age 41 with complications after gallbladder surgery, mother died at age 14 with a perforated gastric ulcer and history of breast cancer, he has 1 brother at age 62 with alive with heart trouble and diabetes and also known Barrett's esophagus.  One brother died in the [place] in 45 at age 36 of myocardial infarction.  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  No Known Allergies  Current Outpatient Medications  Medication Sig Dispense Refill  . Amino Acids-Protein Hydrolys (FEEDING SUPPLEMENT, PRO-STAT SUGAR FREE 64,) LIQD Place 30 mLs into feeding tube daily. 900 mL 0  . hydrocortisone cream-nystatin cream-zinc oxide Apply 1 application 4 (four) times daily topically. (Patient not taking: Reported on 05/18/2017) 4 oz 3  . loperamide (IMODIUM) 2 MG capsule Take 1 capsule (2 mg total) by mouth as needed for diarrhea or loose stools (take two tablests after the first loose stool, then 1 tablet after each loose stool).    . Nutritional Supplements (FEEDING SUPPLEMENT, VITAL 1.5 CAL,) LIQD Give vital 1.5 via continuous pump at 59ml/hr for 22 hours.  Flush with 251ml of water before starting tube feeding and after stopping tube feeding.  Provide additional 272ml of water 2 other times during waking hours via J tube. 1540 mL 12    No current facility-administered medications for this visit.    Facility-Administered Medications Ordered in Other Visits  Medication Dose Route Frequency Provider Last Rate Last Dose  . 0.9 %  sodium chloride infusion   Intravenous Once Sindy Guadeloupe, MD      . ondansetron (ZOFRAN) 8 mg, dexamethasone (DECADRON) 10 mg in sodium chloride 0.9 % 50 mL IVPB   Intravenous Once Sindy Guadeloupe, MD        Pertinent items are noted in HPI.  Review of Systems: Review of systems is reviewed with the patient again and unchanged we notes she has gained weight  Review of Systems  Constitutional: Positive for malaise/fatigue and weight loss. Negative for chills, diaphoresis and fever.  HENT: Negative.   Eyes: Negative.   Respiratory: Negative.   Cardiovascular: Negative for chest pain, palpitations, orthopnea, claudication and PND.  Gastrointestinal: Positive for abdominal pain, constipation, diarrhea, heartburn and nausea. Negative for blood in stool and melena.  Genitourinary: Negative.   Musculoskeletal: Negative.   Skin: Negative.   Neurological: Positive for weakness.  Endo/Heme/Allergies: Negative.   Psychiatric/Behavioral: Negative.     Physical Exam: There were no vitals taken for this visit.  Wt Readings from Last 3 Encounters:  05/18/17 179 lb (81.2 kg)  05/18/17 177 lb 3.2 oz (80.4 kg)  05/10/17 180 lb (81.6 kg)   PHYSICAL EXAMINATION: General appearance: alert and cooperative Head: Normocephalic, without obvious abnormality, atraumatic Neck: no adenopathy, no carotid bruit, no JVD, supple, symmetrical, trachea midline and thyroid not enlarged, symmetric, no tenderness/mass/nodules Lymph nodes: Cervical, supraclavicular, and axillary nodes normal. Resp: clear to auscultation bilaterally Back: symmetric, no curvature. ROM normal. No CVA tenderness. Cardio: regular rate and rhythm, S1, S2 normal, no murmur, click, rub or gallop GI: soft, non-tender; bowel sounds normal; no  masses,  no organomegaly Extremities: extremities normal, atraumatic, no cyanosis or edema Neurologic: Grossly normal Jejunostomy feeding tube is in place the surrounding irritation is much improved  Diagnostic Studies & Laboratory data:     Recent Radiology Findings:   No results found. Dg Esophagus  Result Date: 03/29/2017 CLINICAL DATA:  History of esophageal malignancy status post chemotherapy and radiation therapy. Patient has been unable to eat solid food since October 2018 and only small amounts of thin liquids passed. The patient has vomiting if eating or drinking more than a small amount of thin liquid. Patient uses a G-tube for nurse mint and medication administration. EXAM: ESOPHOGRAM/BARIUM SWALLOW TECHNIQUE: Single contrast examination was performed using thin barium or water soluble. FLUOROSCOPY TIME:  Fluoroscopy Time:  30 seconds Radiation Exposure Index (if provided by the fluoroscopic device): 873 micro Gy per meters square Number of Acquired Spot Images: 5+ 2 video loops COMPARISON:  PET-CT study of October 24th 2018 and barium swallow examination of December 26, 2016. FINDINGS: The patient ingested a small amount of thin barium. The hypopharynx distended well. The cervical esophagus also distended well. The proximal and middle thirds of the esophagus distended well. Approximately 7 cm above the GE junction abrupt long segment tapering of the caliber of the distal esophagus to less than 5 mm was observed. This only transiently relax but never exceeded approximately 3 mm in diameter. The mid esophagus just proximal to the transition point was mildly dilated. 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 More proximally no abnormality in the esophagus is seen. Electronically Signed   By: David  Martinique M.D.   On: 03/29/2017 10:14   Nm Pet Image Restag (ps) Skull Base To Thigh  Result Date: 04/13/2017 CLINICAL  DATA:  Subsequent treatment strategy for esophageal cancer. EXAM: NUCLEAR MEDICINE PET SKULL BASE TO THIGH TECHNIQUE: 13 mCi F-18 FDG was injected intravenously. Full-ring PET imaging was performed from the skull base to thigh after the radiotracer. CT data was obtained and used for attenuation correction and anatomic localization. FASTING BLOOD GLUCOSE:  Value: 97 mg/dl COMPARISON:  01/03/2017 FINDINGS: NECK: No hypermetabolic lymph nodes in the neck. CHEST:  Interval decrease and hypermetabolism in the distal esophagus with SUV max = 4.1 today compared to 8.0 previously. Esophageal dilatation has decreased in the interval. Soft tissue fullness in the distal esophagus has also decreased. No hypermetabolic mediastinal or hilar nodes. No suspicious pulmonary nodules on the CT scan. Right Port-A-Cath tip is positioned in the distal SVC. Coronary artery calcification is evident. Atherosclerotic calcification is noted in the wall of the thoracic aorta. ABDOMEN/PELVIS: No abnormal hypermetabolic activity within the liver, pancreas, adrenal glands, or spleen. No hypermetabolic lymph nodes in the abdomen or pelvis. Tiny hyper scratches that tiny hypoattenuating lesion in the dome of the left liver is stable. Gallbladder surgically absent. There is abdominal aortic atherosclerosis without aneurysm. PEG tube tip noted in the stomach. SKELETON: No focal hypermetabolic activity to suggest skeletal metastasis. IMPRESSION: 1. Interval decrease in soft tissue fullness and hypermetabolism associated with the distal esophageal neoplasm. 2. No evidence for interval development of hypermetabolic metastatic disease in the neck, chest, abdomen, or pelvis. 3. Coronary artery and Aortic Atherosclerois (ICD10-170.0) Electronically Signed   By: Misty Stanley M.D.   On: 04/13/2017 12:03   I have independently reviewed the above radiology studies  and reviewed the findings with the patient.     Nm Pet Image Initial (pi) Skull Base To  Thigh  Result Date: 01/03/2017 CLINICAL DATA:  Initial treatment strategy for esophageal cancer. EXAM: NUCLEAR MEDICINE PET SKULL BASE TO THIGH TECHNIQUE: Twelve point for mCi F-18 FDG was injected intravenously. Full-ring PET imaging was performed from the skull base to thigh after the radiotracer. CT data was obtained and used for attenuation correction and anatomic localization. FASTING BLOOD GLUCOSE:  Value: 127 mg/dl COMPARISON:  CT chest abdomen pelvis 12/26/2016. FINDINGS: NECK: No hypermetabolic lymph nodes in the neck. CT images show no acute findings. CHEST: No hypermetabolic mediastinal, hilar or axillary lymph nodes. Distal esophageal mass measures approximately 2.5 x 2.7 cm with an SUV max of 8.0. No adjacent hypermetabolic adenopathy. No hypermetabolic pulmonary nodules. Atherosclerotic calcification of the arterial vasculature, including coronary arteries. Right PICC tip terminates at the SVC RA junction. Heart is mildly enlarged. No pericardial or pleural effusion. Esophagus is dilated throughout its course, to the level of the above-described esophageal mass. ABDOMEN/PELVIS: No abnormal hypermetabolism in the liver, adrenal glands, spleen or pancreas. No hypermetabolic lymph nodes. Subcentimeter low-attenuation lesion in the dome of the liver is too small to characterize. Cholecystectomy. Adrenal glands, kidneys, spleen, pancreas, stomach and bowel are grossly unremarkable. Atherosclerotic calcification of the arterial vasculature without aneurysm. Prostate is mildly enlarged. No free fluid. SKELETON: No abnormal osseous hypermetabolism. IMPRESSION: 1. Hypermetabolic distal esophageal mass with associated esophageal dilatation. No hypermetabolic adenopathy or distant metastatic disease. 2. Aortic atherosclerosis (ICD10-170.0). Coronary artery calcification. Electronically Signed   By: Lorin Picket M.D.   On: 01/03/2017 14:44   Ct Chest Wo Contrast/ Ct Abdomen Wo Contrast  Result Date:  12/26/2016 CLINICAL DATA:  Esophageal cancer. Gastroesophageal reflux disease. Dysphagia. Endoscopy from earlier today demonstrating esophageal stenosis at approximately 40 cm from the incisors. Rule out perforation of esophagus. EXAM: CT CHEST, ABDOMEN WITH CONTRAST TECHNIQUE: Multidetector CT imaging of the chest, abdomen was performed following the standard protocol during bolus administration of intravenous contrast. CONTRAST:  75 cc of Isovue-300 COMPARISON:  Esophagram of earlier today.  Prior CTs of 12/11/2016. FINDINGS: CT CHEST FINDINGS Cardiovascular: Aortic and branch vessel atherosclerosis. Normal heart size, without pericardial effusion. Multivessel coronary artery atherosclerosis. A right-sided PICC line terminates at the high right atrium. Mediastinum/Nodes:  No supraclavicular adenopathy. No mediastinal or definite hilar adenopathy, given limitations of unenhanced CT. Soft tissue fullness at the distal esophagus including on image 50/series 2. This is similar. The more proximal esophagus is dilated and contrast filled from today's esophagram. No extraluminal contrast identified. No pneumomediastinum. Lungs/Pleura: No pleural fluid. Right upper lobe subpleural calcified granuloma on image 65/series 4. 5 mm lingular nodule on image 97/series 4 is is similar to on the prior. Minimal subpleural left lower lobe nodularity at 3 mm on image 127/series 4. Not readily apparent on the prior, favored to represent a subpleural lymph node. Musculoskeletal: No acute osseous abnormality. CT ABDOMEN  FINDINGS Hepatobiliary: High left hepatic lobe subcentimeter low-density lesion is likely a cyst. Cholecystectomy, without biliary ductal dilatation. Pancreas: Pancreatic atrophy, without duct dilatation or dominant mass. Spleen: Old granulomatous disease in the spleen. Adrenals/Urinary Tract: Normal adrenal glands. No renal calculi or hydronephrosis. Stomach/Bowel: Normal appearance of the stomach. Normal abdominal  bowel loops. Vascular/Lymphatic: Aortic and branch vessel atherosclerosis. Gastrohepatic ligament adenopathy is again identified, including at 1.3 cm on image 53/series 2. Other:  No ascites.  No abdominal peritoneal or omental metastasis. Musculoskeletal: No acute osseous abnormality. IMPRESSION: 1. Soft tissue fullness in the distal esophagus, likely representing the primary site of malignancy. This is at least partially obstructive, with contrast from today's esophagram identified within a dilated more superior esophagus. 2. Isolated gastrohepatic ligament adenopathy, highly suspicious for metastatic disease. 3. No evidence of esophageal perforation. 4. Nonspecific lingular nodule, similar. 5. Coronary artery atherosclerosis. Aortic Atherosclerosis (ICD10-I70.0). Electronically Signed   By: Abigail Miyamoto M.D.   On: 12/26/2016 14:58   Ct Chest W Contrast Ct Abdomen Pelvis W Contrast  Result Date: 12/11/2016 CLINICAL DATA:  Patient with progressive difficulty swallowing. EXAM: CT CHEST, ABDOMEN, AND PELVIS WITH CONTRAST TECHNIQUE: Multidetector CT imaging of the chest, abdomen and pelvis was performed following the standard protocol during bolus administration of intravenous contrast. CONTRAST:  157mL ISOVUE-300 IOPAMIDOL (ISOVUE-300) INJECTION 61% COMPARISON:  CT abdomen pelvis 05/11/2006 FINDINGS: CT CHEST FINDINGS Cardiovascular: Normal heart size. Coronary arterial vascular calcifications. Thoracic aortic vascular calcifications. Right upper extremity PICC line tip terminates in the superior vena cava. Mediastinum/Nodes: No enlarged axillary, mediastinal or hilar lymphadenopathy. There is wall thickening and narrowing of the distal esophagus (image 50; series 2). Lungs/Pleura: There is a 7 mm subpleural nodule in the lingula (image 96; series 4). Calcified granuloma within the medial right upper lobe (image 63; series 4). Dependent atelectasis/ scarring within the lower lobes bilaterally. No pleural effusion  or pneumothorax. Musculoskeletal: Thoracic spine degenerative changes. No aggressive or acute appearing osseous lesions. CT ABDOMEN PELVIS FINDINGS Hepatobiliary: The liver is normal in size and contour. Subcentimeter too small to characterize low-attenuation lesion left hepatic lobe (image 50 ; series 2). Fatty deposition adjacent to the falciform ligament. Status post cholecystectomy. No intrahepatic or extrahepatic biliary ductal dilatation. Pancreas: Mildly atrophic. Spleen: Calcified granulomas in the spleen. Adrenals/Urinary Tract: The adrenal glands are normal. Kidneys enhance symmetrically with contrast. No hydronephrosis. Urinary bladder is unremarkable. Stomach/Bowel: Colon is decompressed, limiting evaluation. Suggestion of mild colonic wall thickening. Normal morphology of the stomach. There is a 1.3 cm soft tissue nodule adjacent to the GE junction (image 53; series 2). Vascular/Lymphatic: Normal caliber abdominal aorta. Peripheral calcified atherosclerotic plaque. No retroperitoneal lymphadenopathy. Reproductive: Prostate unremarkable. Other: Bilateral fat containing inguinal hernias. Musculoskeletal: Lumbar spine degenerative changes. No aggressive or acute appearing osseous lesions. IMPRESSION: There is a 1.3 cm soft tissue nodule adjacent to the GE junction.  This is favored to represent an enlarged lymph node, potentially reactive or metastatic in etiology. The colon is decompressed however there is suggestion of wall thickening, raising the possibility of colitis. There is a 7 mm nodule within the left upper lobe. Non-contrast chest CT at 6-12 months is recommended. If the nodule is stable at time of repeat CT, then future CT at 18-24 months (from today's scan) is considered optional for low-risk patients, but is recommended for high-risk patients. This recommendation follows the consensus statement: Guidelines for Management of Incidental Pulmonary Nodules Detected on CT Images: From the  Fleischner Society 2017; Radiology 2017; 284:228-243. Narrowing and wall thickening of the distal esophagus, compatible with known distal esophageal stricture. Electronically Signed   By: Lovey Newcomer M.D.   On: 12/11/2016 20:02   Dg Esophagus  Result Date: 12/06/2016 CLINICAL DATA:  Difficulty swallowing, constant vomiting EXAM: ESOPHOGRAM/BARIUM SWALLOW TECHNIQUE: Single contrast examination was performed using  thick barium. FLUOROSCOPY TIME:  Fluoroscopy Time:  0.5 minute Radiation Exposure Index (if provided by the fluoroscopic device): 5.7 mGy Number of Acquired Spot Images: 0 COMPARISON:  None. FINDINGS: There was normal pharyngeal anatomy and motility. Patient is extremely nauseous. Mild esophageal dilatation with a high-grade stricture of the distal esophagus with irregular margins. IMPRESSION: 1. 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 Recommend upper endoscopy. Electronically Signed   By: Kathreen Devoid   On: 12/06/2016 11:53   Dg Esophagus W/water Sol Cm  Result Date: 12/26/2016 CLINICAL DATA:  Recent esophageal instrumentation. Pain. Evaluate for esophageal rupture. EXAM: ESOPHOGRAM/BARIUM SWALLOW TECHNIQUE: Single contrast examination was performed using water-soluble contrast. FLUOROSCOPY TIME:  1 minutes and 6 seconds COMPARISON:  None. FINDINGS: The patient ingested water-soluble contrast and imaging was obtained. There is an obstruction of the distal esophagus, just above the gastroesophageal junction. A fluid level exists in the esophagus. Contrast never traversed beyond the distal esophagus. The patient vomited. There is no evidence of extravasation to suggest leak or rupture. IMPRESSION: No evidence of contrast extravasation to suggest esophageal injury or perforation There is abrupt obstruction of the distal esophagus just above the gastroesophageal junction.  Electronically Signed   By: Marybelle Killings M.D.   On: 12/26/2016 14:46     I have independently reviewed the above radiology studies  and reviewed the findings with the patient.   Recent Lab Findings: Lab Results  Component Value Date   WBC 6.5 04/20/2017   HGB 13.1 04/20/2017   HCT 39.7 (L) 04/20/2017   PLT 278 04/20/2017   GLUCOSE 102 (H) 04/20/2017   TRIG 92 12/11/2016   ALT 26 04/20/2017   AST 19 04/20/2017   NA 136 04/20/2017   K 3.8 04/20/2017   CL 104 04/20/2017   CREATININE 0.82 04/20/2017   BUN 21 (H) 04/20/2017   CO2 24 04/20/2017   INR 1.04 01/10/2017   Path 12/26/2016  Collected: 12/26/16 1152  Resulting lab: [email or web address]  Value: Surgical Pathology  CASE: [id number]  PATIENT: Casey Acevedo  Surgical Pathology Report      SPECIMEN SUBMITTED:  A. 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 ESOPHAGUS STRICTURE; COLD BIOPSY:  - SQUAMOUS CELL CARCINOMA.   Comment:  These findings were communicated to Dr. Vicente Males on 12/27/2016.   GROSS DESCRIPTION:   A.  Labeled: esophageal stricture C BX   Tissue fragment(s): 3   Size: 0.1-0.4 cm   Description: pink fragments   Entirely submitted in 1 cassette(s).     Final Diagnosis performed by Quay Burow, MD. Electronically signed  12/27/2016 2:05:42PM     The electronic signature indicates that the named Attending Pathologist  has evaluated the specimen   Technical component performed at [facility], [address] [place],  [place] [address]  Lab: [phone] Dir: Darrick Penna. Evette Doffing, MD   Professional component performed at [facility], [facility],  [address], [place], [place] [address]  Lab: [phone] Dir: Dellia Nims. Rubinas, MD    ENDO:12/26/2016 One severe stenosis was found 38 to 40 cm from the incisors. This measured 6 mm  (inner diameter) x 2 cm (in length) and was traversed after downsizing scope and dilating. A guide wire was placed, then the scope was withdrawn. Using the wire as a guide, dilation with an 10-19-08 mm pyloric balloon dilator was performed under fluoroscopic guidance. The dilation site was examined following endoscope reinsertion and showed mild improvement in luminal narrowing. We were unable to pass the adult scope , we subsequently further dilated the stricture with a CRE dilator serially from 10-12 mm and subsequently the adult scope was able to pass through. The stricture still appeared to be very tight and the scope was not able to easily glide past. Biopsies were taken with a cold forceps for histology.     Assessment / Plan:   Patient continues to gain weight and improve since last seen.  He still has difficulty swallowing and uses his feeding jejunostomy tube.  We discussed the pros and cons of proceeding with esophageal resection.  The patient will obtain a follow-up CT scan can of the chest and abdomen and if there is no evidence of distant metastatic disease we will plan to proceed with esophagectomy Monday, April 8.   I had a detailed discussion with Casey Acevedo regarding the magnitude of the surgical esophagectomy procedure as well as the risks, the expected benefits, and alternatives.  I quoted Casey Acevedo 5% perioperative mortality rate and a complication rate as high as 40%.  We specifically discussed complications, which include, but were not limited to: recurrent nerve injury with possible permanent hoarseness, anastomotic leak, airway and great vessel injury, conduit ischemia, thoracic duct leak, the inability to complete the operation via a transhiatal approach requiring a right thoracotomy,  bleeding, need for blood transfusion and the potential need for ventilator support.  Casey Acevedo has had questions answered is well informed and willing to proceed.  Casey Isaac  MD      [address] [place] [address] Office [phone]   Beeper [phone]  06/07/2017 3:46 PM

## 2017-06-07 NOTE — Progress Notes (Signed)
Preparation for your surgery: Bowel Prep  You will need to undergo a bowel prep starting 2 days prior to your surgery.  You will start clear liquids mid-day April 6th and all day April 7th.  Nothing to eat to drink after 4/7 at midnight.  Clear liquid diet:  This includes broths (vegetable, chicken or beef), juices (grape, apple, or cranberry), Jell-O, popsicles, coffee or tea (without milk or cream) and Gatorade. Hard candy and gum are OK. Avoid alcohol and all solid food. You should take in at LEAST 10 cups of fluid this day. In addition to the clear liquid diet (that you will remain on the entire day).  You will also need to drink ONE 10 ounce bottle of Magnesium Citrate on April 7th. It is best if you can drink it mid-morning, around 10 am (you can buy this over the counter at your neighborhood pharmacy).

## 2017-06-08 ENCOUNTER — Other Ambulatory Visit: Payer: Self-pay | Admitting: *Deleted

## 2017-06-08 DIAGNOSIS — C159 Malignant neoplasm of esophagus, unspecified: Secondary | ICD-10-CM

## 2017-06-11 ENCOUNTER — Ambulatory Visit
Admission: RE | Admit: 2017-06-11 | Discharge: 2017-06-11 | Disposition: A | Discharge status: Home / Self Care | Payer: Medicare Other | Source: Ambulatory Visit | Attending: Cardiothoracic Surgery | Admitting: Cardiothoracic Surgery

## 2017-06-11 DIAGNOSIS — C159 Malignant neoplasm of esophagus, unspecified: Secondary | ICD-10-CM | POA: Diagnosis not present

## 2017-06-11 LAB — POCT I-STAT CREATININE: Creatinine, Ser: 0.8 mg/dL (ref 0.61–1.24)

## 2017-06-11 IMAGING — CT CT ABDOMEN W/ CM
4 of 5 series · 11 of 46 positions shown, 16 images · IV contrast (iopamidol)
Comparison: PET-CT [DATE], [DATE]

CLINICAL DATA: Esophageal cancer. Squamous cell carcinoma status
post chemo therapy and radiation therapy in [DATE]

EXAM:
CT CHEST, ABDOMEN,WITH CONTRAST
TECHNIQUE: Multidetector CT imaging of the chest, abdomen was performed
following the standard protocol during bolus administration of
intravenous contrast.
CONTRAST:  100mL [6U] IOPAMIDOL ([6U]) INJECTION 61%

[Series 2: chest/abd w · axial · 0.79mm/px · z∈[-294,-39]mm · 5 of 104 slices shown]
[im 11/104  soft-tissue]
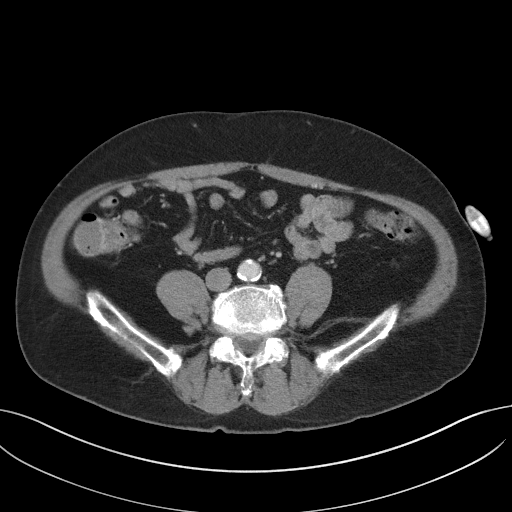
[im 21/104  soft-tissue]
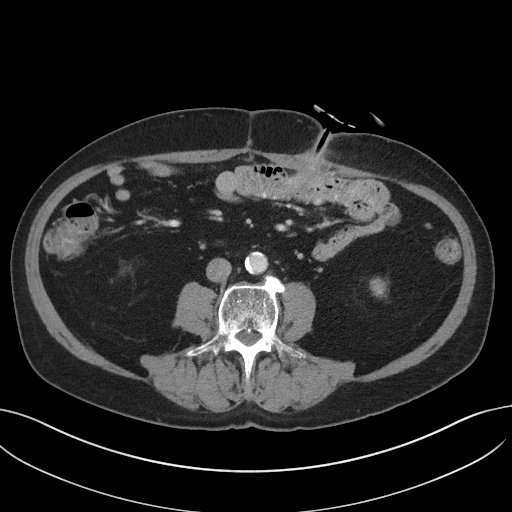
[im 31/104  soft-tissue]
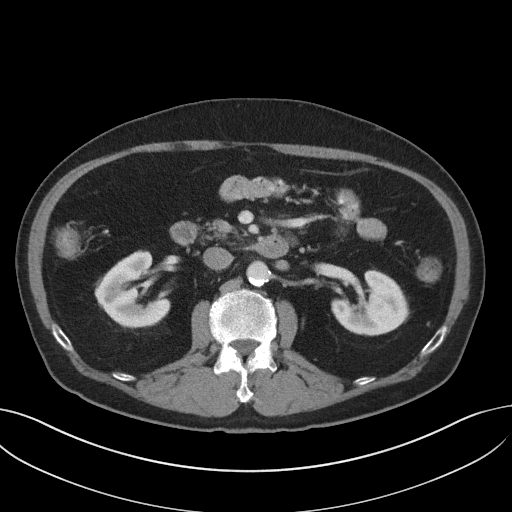
[im 42/104  soft-tissue]
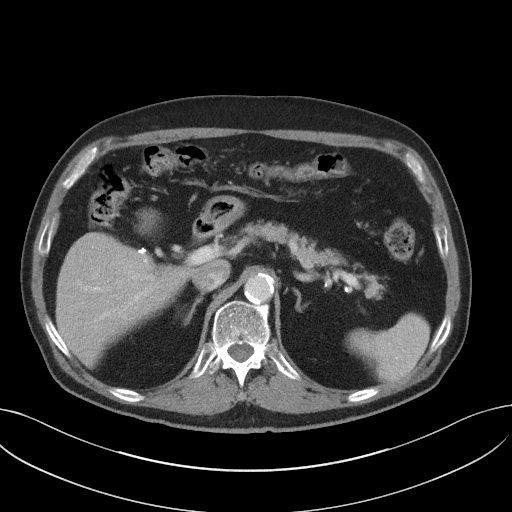
[im 62/104  soft-tissue]
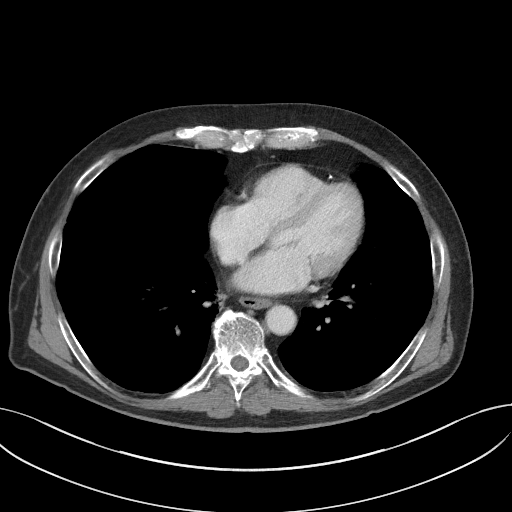

[Series 5: coronals · coronal · 0.77mm/px · 3 of 125 slices shown, 4 images]
[im 42/125  soft-tissue]
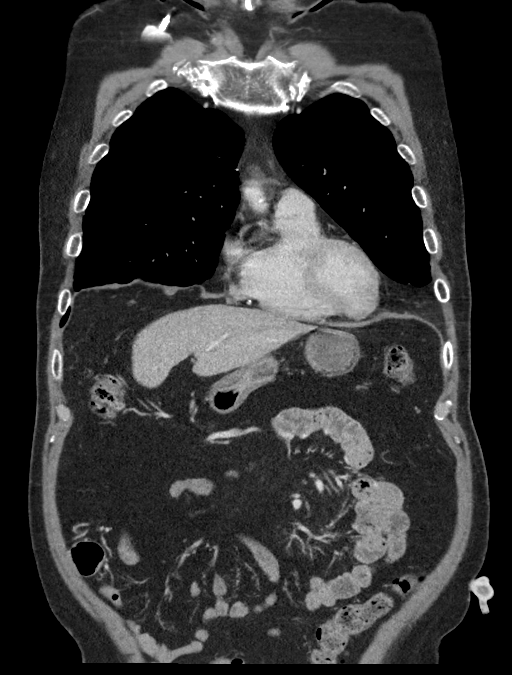
[im 56/125  soft-tissue]
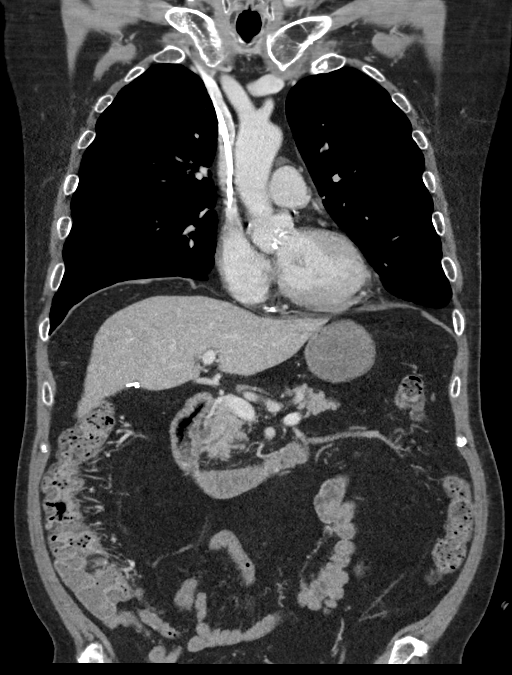
[im 56/125  bone]
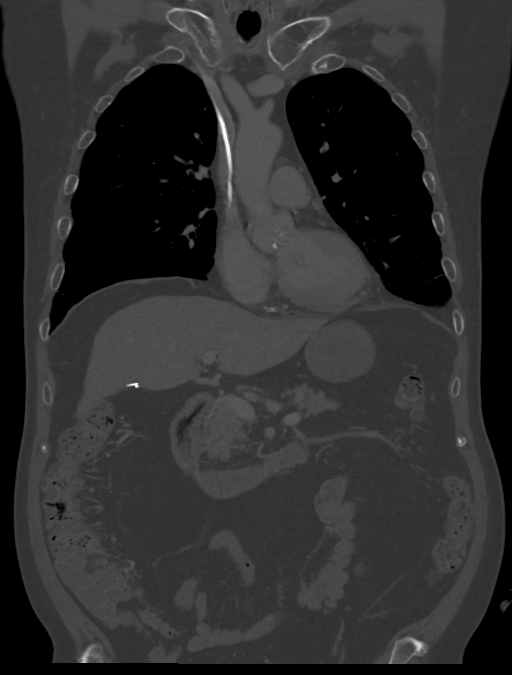
[im 69/125  soft-tissue]
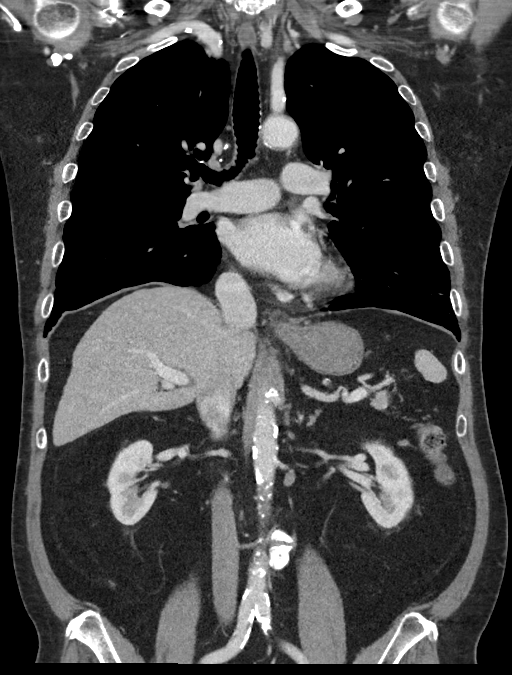

[Series 6: sagittal · sagittal · 0.61mm/px · 1 of 176 slices shown, 2 images]
[im 59/176  soft-tissue]
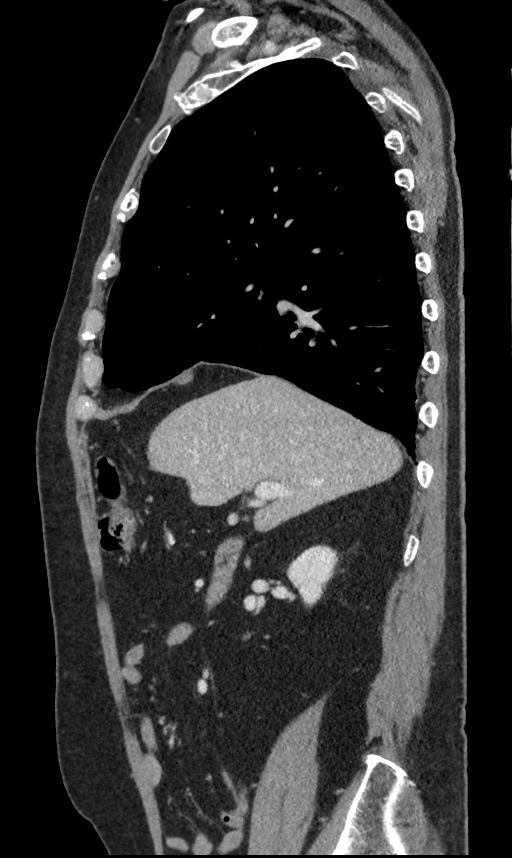
[im 59/176  bone]
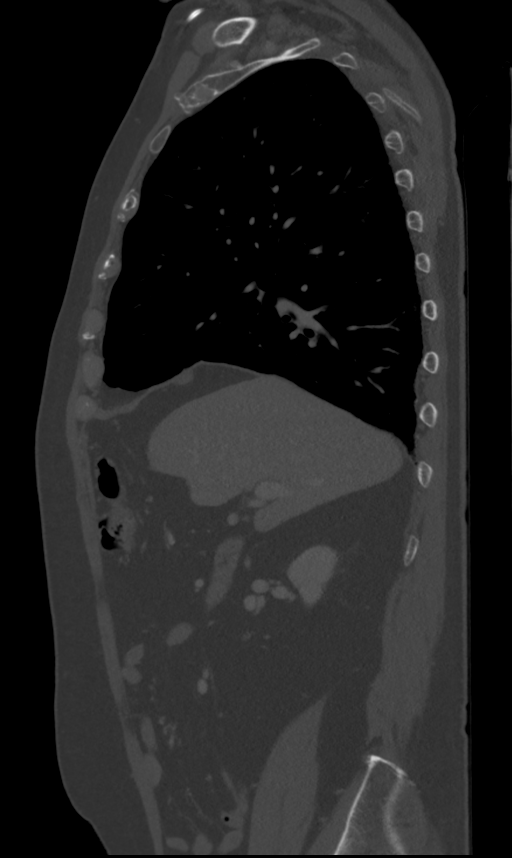

[Series 7: delay · axial · delayed · 0.79mm/px · z∈[-218,-158]mm · 2 of 36 slices shown, 5 images]
[im 12/36  soft-tissue]
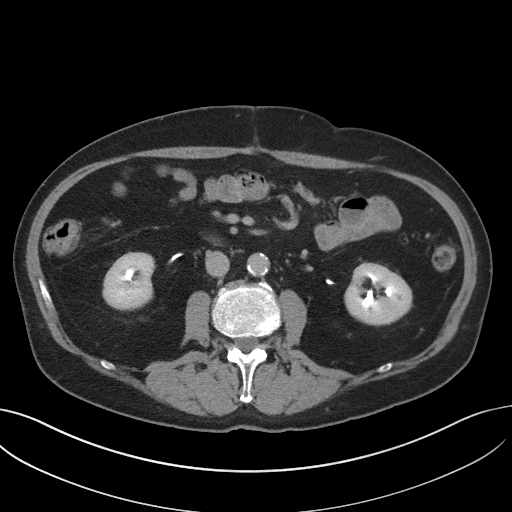
[im 12/36  lung]
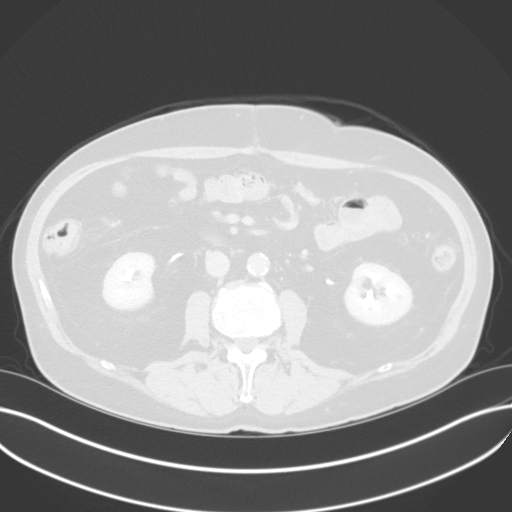
[im 12/36  bone]
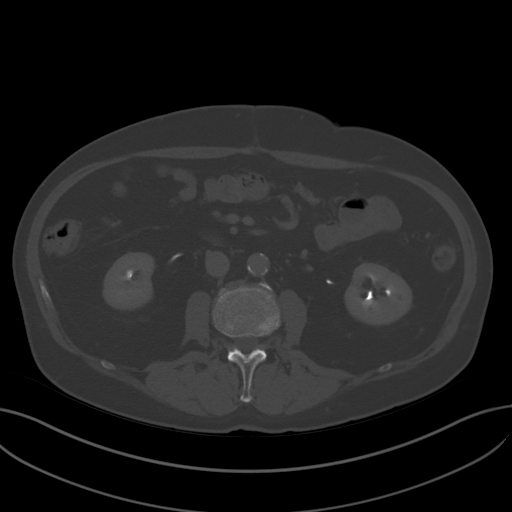
[im 24/36  soft-tissue]
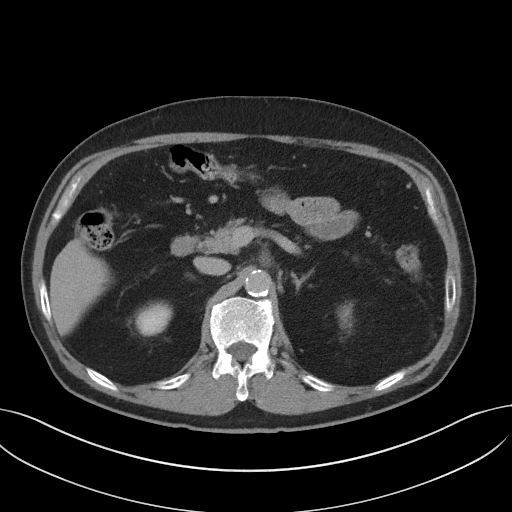
[im 24/36  lung]
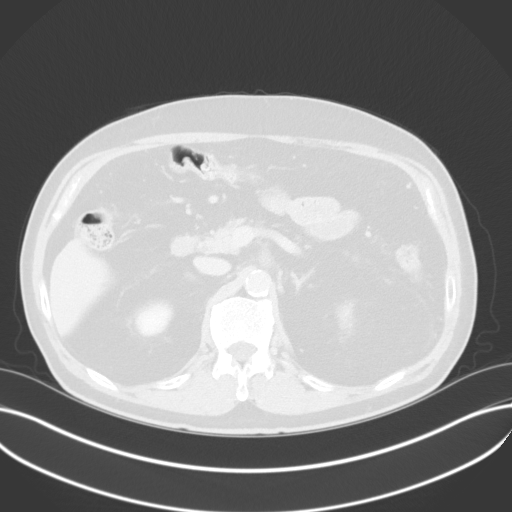

[11 of 46 positions shown; findings below may reference images not displayed]

FINDINGS: CT CHEST FINDINGS

Cardiovascular: Coronary artery calcification and aortic
atherosclerotic calcification.

Mediastinum/Nodes: No axillary supraclavicular adenopathy. Port in
the anterior chest wall with tip in distal SVC.

Esophagus is normal contour without evidence obstruction, thickening
or mass. No paraesophageal lymph nodes identified.

Lungs/Pleura: No suspicious pulmonary nodularity. Mild branching
reticulonodular pattern in the RIGHT upper lobe suggest mild
infection or radiation change.

Musculoskeletal: No aggressive osseous lesion.

CT ABDOMEN FINDINGS

Hepatobiliary: Stable cyst in the LEFT hepatic lobe.
Postcholecystectomy.

Pancreas: Normal pancreatic parenchymal intensity. No ductal
dilatation or inflammation.

Spleen: Normal spleen.

Adrenals/urinary tract: Adrenal glands and kidneys are normal.

Stomach/Bowel: Stomach, duodenum small-bowel normal. Percutaneous
jejunostomy tube in place with retention bulb within the jejunum.
Limited view of the colon is unremarkable.

Vascular/Lymphatic: Aortic calcification. No upper abdominal
lymphadenopathy. No gastrohepatic ligament adenopathy.

Musculoskeletal: No aggressive osseous lesion
IMPRESSION: 1. No evidence local recurrence of esophageal carcinoma.
2. No evidence of metastatic adenopathy, pulmonary metastasis or
liver metastasis.
3. Percutaneous jejunostomy tube in proper position.
4. Mild reticulonodular pattern within the LEFT lung favored post
infectious or related to radiation.

## 2017-06-11 MED ORDER — IOPAMIDOL (ISOVUE-300) INJECTION 61%
100.0000 mL | Freq: Once | INTRAVENOUS | Status: AC | PRN
  Administered 2017-06-11: 100 mL via INTRAVENOUS

## 2017-06-13 NOTE — Pre-Procedure Instructions (Signed)
Casey Acevedo  06/13/2017      [address] [place], [place] [address] Phone: [phone] Fax: [phone]    Your procedure is scheduled on 06/18/2017.  Report to [facility] Admitting at Casey.M.  Call this number if you have problems the morning of surgery:  [phone]   Remember:  Do not eat food or drink liquids after midnight.  Take these medicines the morning of surgery with A SIP OF WATER: NONE    Do not wear jewelry.  Do not wear lotions, powders, or colognes, or deodorant.  Men may shave face and neck.  Do not bring valuables to the hospital.  [facility] is not responsible for any belongings or valuables.  Hearing aids, eyeglasses, contacts, dentures or bridgework may not be worn into surgery.  Leave your suitcase in the car.  After surgery it may be brought to your room.  For patients admitted to the hospital, discharge time will be determined by your treatment team.  Patients discharged the day of surgery will not be allowed to drive home.   Name and phone number of your driver:    Special instructions:   [facility]- Preparing For Surgery  Before surgery, you can play an important role. Because skin is not sterile, your skin needs to be as free of germs as possible. You can reduce the number of germs on your skin by washing with CHG (chlorahexidine gluconate) Soap before surgery.  CHG is an antiseptic cleaner which kills germs and bonds with the skin to continue killing germs even after washing.  Please do not use if you have an allergy to CHG or antibacterial soaps. If your skin becomes reddened/irritated stop using the CHG.  Do not shave (including legs and underarms) for at least 48 hours prior to first CHG shower. It is OK to shave your face.  Please follow these instructions carefully.   1. Shower the NIGHT BEFORE SURGERY and the MORNING OF SURGERY with CHG.   2. If you chose to wash  your hair, wash your hair first as usual with your normal shampoo.  3. After you shampoo, rinse your hair and body thoroughly to remove the shampoo.  4. Use CHG as you would any other liquid soap. You can apply CHG directly to the skin and wash gently with a scrungie or a clean washcloth.   5. Apply the CHG Soap to your body ONLY FROM THE NECK DOWN.  Do not use on open wounds or open sores. Avoid contact with your eyes, ears, mouth and genitals (private parts). Wash Face and genitals (private parts)  with your normal soap.  6. Wash thoroughly, paying special attention to the area where your surgery will be performed.  7. Thoroughly rinse your body with warm water from the neck down.  8. DO NOT shower/wash with your normal soap after using and rinsing off the CHG Soap.  9. Pat yourself dry with a CLEAN TOWEL.  10. Wear CLEAN PAJAMAS to bed the night before surgery, wear comfortable clothes the morning of surgery  11. Place CLEAN SHEETS on your bed the night of your first shower and DO NOT SLEEP WITH PETS.    Day of Surgery: Shower as stated above. Do not apply any deodorants/lotions. Please wear clean clothes to the hospital/surgery center.      Please read over the following fact sheets that you were given.

## 2017-06-14 ENCOUNTER — Encounter (HOSPITAL_COMMUNITY)
Admission: RE | Admit: 2017-06-14 | Discharge: 2017-06-14 | Disposition: A | Discharge status: Home / Self Care | Payer: Medicare Other | Source: Ambulatory Visit | Attending: Cardiothoracic Surgery | Admitting: Cardiothoracic Surgery

## 2017-06-14 ENCOUNTER — Other Ambulatory Visit: Payer: Self-pay

## 2017-06-14 ENCOUNTER — Encounter (HOSPITAL_COMMUNITY): Payer: Self-pay

## 2017-06-14 DIAGNOSIS — C159 Malignant neoplasm of esophagus, unspecified: Secondary | ICD-10-CM

## 2017-06-14 DIAGNOSIS — Z01812 Encounter for preprocedural laboratory examination: Secondary | ICD-10-CM | POA: Insufficient documentation

## 2017-06-14 LAB — CBC
HCT: 45.1 % (ref 39.0–52.0)
Hemoglobin: 14.9 g/dL (ref 13.0–17.0)
MCH: 28.2 pg (ref 26.0–34.0)
MCHC: 33 g/dL (ref 30.0–36.0)
MCV: 85.3 fL (ref 78.0–100.0)
Platelets: 209 10*3/uL (ref 150–400)
RBC: 5.29 MIL/uL (ref 4.22–5.81)
RDW: 14.6 % (ref 11.5–15.5)
WBC: 7.1 10*3/uL (ref 4.0–10.5)

## 2017-06-14 LAB — COMPREHENSIVE METABOLIC PANEL
ALT: 25 U/L (ref 17–63)
AST: 16 U/L (ref 15–41)
Albumin: 3.9 g/dL (ref 3.5–5.0)
Alkaline Phosphatase: 90 U/L (ref 38–126)
Anion gap: 11 (ref 5–15)
BUN: 21 mg/dL — ABNORMAL HIGH (ref 6–20)
CO2: 19 mmol/L — ABNORMAL LOW (ref 22–32)
Calcium: 8.9 mg/dL (ref 8.9–10.3)
Chloride: 107 mmol/L (ref 101–111)
Creatinine, Ser: 0.82 mg/dL (ref 0.61–1.24)
GFR calc Af Amer: >60 mL/min (ref >60)
GFR calc non Af Amer: >60 mL/min (ref >60)
Glucose, Bld: 89 mg/dL (ref 65–99)
Potassium: 4.3 mmol/L (ref 3.5–5.1)
Sodium: 137 mmol/L (ref 135–145)
Total Bilirubin: 0.7 mg/dL (ref 0.3–1.2)
Total Protein: 7.2 g/dL (ref 6.5–8.1)

## 2017-06-14 LAB — BLOOD GAS, ARTERIAL
Acid-base deficit: 0.7 mmol/L (ref 0.0–2.0)
Bicarbonate: 23 mmol/L (ref 20.0–28.0)
Drawn by: 421801
FIO2: 21
O2 Saturation: 97 %
Patient temperature: 98.6
pCO2 arterial: 34.7 mmHg (ref 32.0–48.0)
pH, Arterial: 7.436 (ref 7.350–7.450)
pO2, Arterial: 95.8 mmHg (ref 83.0–108.0)

## 2017-06-14 LAB — URINALYSIS, ROUTINE W REFLEX MICROSCOPIC
Bilirubin Urine: NEGATIVE
Glucose, UA: NEGATIVE mg/dL
Hgb urine dipstick: NEGATIVE
Ketones, ur: 5 mg/dL — AB
Leukocytes, UA: NEGATIVE
Nitrite: NEGATIVE
Protein, ur: NEGATIVE mg/dL
Specific Gravity, Urine: 1.027 (ref 1.005–1.030)
pH: 6 (ref 5.0–8.0)

## 2017-06-14 LAB — APTT: aPTT: 31 seconds (ref 24–36)

## 2017-06-14 LAB — SURGICAL PCR SCREEN
MRSA, PCR: NEGATIVE
Staphylococcus aureus: NEGATIVE

## 2017-06-14 LAB — PROTIME-INR
INR: 1
Prothrombin Time: 13.1 seconds (ref 11.4–15.2)

## 2017-06-14 LAB — ABO/RH: ABO/RH(D): O POS

## 2017-06-14 NOTE — Progress Notes (Addendum)
PCP - Dr. Doy Hutching Cardiologist - Dr. Saunders Revel  Chest x-ray - get DOS EKG - 05/18/2017 Stress Test - patient denies ECHO - patient denies Cardiac Cath - patient denies  Sleep Study - patient denies  Anesthesia review: n/a  Patient denies shortness of breath, fever, cough and chest pain at PAT appointment   Patient verbalized understanding of instructions that were given to them at the PAT appointment. Patient was also instructed that they will need to review over the PAT instructions again at home before surgery.  Patient expressed concern regarding his tube feeding while he is admitted in the hospital post surgery.  Patient states that while he was hospitalized in December they did not give him any supplements through his tube and he lost weight and was very weak after discharge.  He states he fears this will happen again.  I have educated the patient to speak with Dr. Servando Snare as well as his RN to make sure this is something that does not happen again and he is receiving the proper nutrition while he is admitted.

## 2017-06-17 NOTE — Anesthesia Preprocedure Evaluation (Addendum)
Anesthesia Evaluation  Patient identified by MRN, date of birth, ID band Patient awake    Reviewed: Allergy & Precautions, NPO status , Patient's Chart, lab work & pertinent test results  Airway Mallampati: IV  TM Distance: >3 FB Neck ROM: Full    Dental no notable dental hx.    Pulmonary neg pulmonary ROS,    Pulmonary exam normal breath sounds clear to auscultation       Cardiovascular Normal cardiovascular exam Rhythm:Regular Rate:Normal  ECG: SR, rate 99  Cardiologist - Dr. Saunders Revel   Neuro/Psych negative neurological ROS  negative psych ROS   GI/Hepatic Neg liver ROS, Dysphagia   Endo/Other  negative endocrine ROS  Renal/GU negative Renal ROS     Musculoskeletal negative musculoskeletal ROS (+)   Abdominal   Peds  Hematology negative hematology ROS (+)   Anesthesia Other Findings ESOPHAGEAL CANCER  Reproductive/Obstetrics                            Anesthesia Physical Anesthesia Plan  ASA: III  Anesthesia Plan: General   Post-op Pain Management:    Induction: Intravenous  PONV Risk Score and Plan: 2 and Dexamethasone, Ondansetron, Treatment may vary due to age or medical condition and Midazolam  Airway Management Planned: Oral ETT  Additional Equipment: Arterial line, CVP and Ultrasound Guidance Line Placement  Intra-op Plan:   Post-operative Plan: Possible Post-op intubation/ventilation  Informed Consent: I have reviewed the patients History and Physical, chart, labs and discussed the procedure including the risks, benefits and alternatives for the proposed anesthesia with the patient or authorized representative who has indicated his/her understanding and acceptance.   Dental advisory given  Plan Discussed with: CRNA  Anesthesia Plan Comments:        Anesthesia Quick Evaluation

## 2017-06-18 ENCOUNTER — Encounter (HOSPITAL_COMMUNITY): Payer: Self-pay | Admitting: *Deleted

## 2017-06-18 ENCOUNTER — Inpatient Hospital Stay (HOSPITAL_COMMUNITY): Payer: Medicare Other | Admitting: Certified Registered Nurse Anesthetist

## 2017-06-18 ENCOUNTER — Other Ambulatory Visit: Payer: Self-pay

## 2017-06-18 ENCOUNTER — Encounter (HOSPITAL_COMMUNITY)
Admission: RE | Disposition: A | Discharge status: Home Health | Payer: Self-pay | Source: Ambulatory Visit | Attending: Cardiothoracic Surgery

## 2017-06-18 ENCOUNTER — Inpatient Hospital Stay (HOSPITAL_COMMUNITY): Payer: Medicare Other

## 2017-06-18 ENCOUNTER — Inpatient Hospital Stay (HOSPITAL_COMMUNITY)
Admission: RE | Admit: 2017-06-18 | Discharge: 2017-06-29 | DRG: 327 | Disposition: A | Discharge status: Home Health | Payer: Medicare Other | Source: Ambulatory Visit | Attending: Cardiothoracic Surgery | Admitting: Cardiothoracic Surgery

## 2017-06-18 DIAGNOSIS — R000 Tachycardia, unspecified: Secondary | ICD-10-CM | POA: Diagnosis not present

## 2017-06-18 DIAGNOSIS — Z8249 Family history of ischemic heart disease and other diseases of the circulatory system: Secondary | ICD-10-CM

## 2017-06-18 DIAGNOSIS — J9811 Atelectasis: Secondary | ICD-10-CM | POA: Diagnosis not present

## 2017-06-18 DIAGNOSIS — R3915 Urgency of urination: Secondary | ICD-10-CM | POA: Diagnosis not present

## 2017-06-18 DIAGNOSIS — C155 Malignant neoplasm of lower third of esophagus: Principal | ICD-10-CM | POA: Diagnosis present

## 2017-06-18 DIAGNOSIS — Z9221 Personal history of antineoplastic chemotherapy: Secondary | ICD-10-CM | POA: Diagnosis not present

## 2017-06-18 DIAGNOSIS — Z934 Other artificial openings of gastrointestinal tract status: Secondary | ICD-10-CM

## 2017-06-18 DIAGNOSIS — R350 Frequency of micturition: Secondary | ICD-10-CM | POA: Diagnosis not present

## 2017-06-18 DIAGNOSIS — Z833 Family history of diabetes mellitus: Secondary | ICD-10-CM

## 2017-06-18 DIAGNOSIS — Z09 Encounter for follow-up examination after completed treatment for conditions other than malignant neoplasm: Secondary | ICD-10-CM

## 2017-06-18 DIAGNOSIS — I493 Ventricular premature depolarization: Secondary | ICD-10-CM | POA: Diagnosis not present

## 2017-06-18 DIAGNOSIS — Z9889 Other specified postprocedural states: Secondary | ICD-10-CM

## 2017-06-18 DIAGNOSIS — Z923 Personal history of irradiation: Secondary | ICD-10-CM | POA: Diagnosis not present

## 2017-06-18 DIAGNOSIS — C159 Malignant neoplasm of esophagus, unspecified: Secondary | ICD-10-CM

## 2017-06-18 DIAGNOSIS — D62 Acute posthemorrhagic anemia: Secondary | ICD-10-CM | POA: Diagnosis not present

## 2017-06-18 DIAGNOSIS — Z9049 Acquired absence of other specified parts of digestive tract: Secondary | ICD-10-CM

## 2017-06-18 DIAGNOSIS — J90 Pleural effusion, not elsewhere classified: Secondary | ICD-10-CM | POA: Diagnosis not present

## 2017-06-18 DIAGNOSIS — R197 Diarrhea, unspecified: Secondary | ICD-10-CM | POA: Diagnosis not present

## 2017-06-18 DIAGNOSIS — Z9689 Presence of other specified functional implants: Secondary | ICD-10-CM

## 2017-06-18 HISTORY — PX: COMPLETE ESOPHAGECTOMY: SHX5286

## 2017-06-18 HISTORY — PX: VIDEO BRONCHOSCOPY: SHX5072

## 2017-06-18 HISTORY — PX: PYLOROMYOTOMY: SHX5274

## 2017-06-18 LAB — POCT I-STAT 7, (LYTES, BLD GAS, ICA,H+H)
Acid-base deficit: 5 mmol/L — ABNORMAL HIGH (ref 0.0–2.0)
Acid-base deficit: 2 mmol/L (ref 0.0–2.0)
Bicarbonate: 21.2 mmol/L (ref 20.0–28.0)
Bicarbonate: 23.4 mmol/L (ref 20.0–28.0)
Bicarbonate: 25.8 mmol/L (ref 20.0–28.0)
Calcium, Ion: 1.06 mmol/L — ABNORMAL LOW (ref 1.15–1.40)
Calcium, Ion: 1.09 mmol/L — ABNORMAL LOW (ref 1.15–1.40)
Calcium, Ion: 1.06 mmol/L — ABNORMAL LOW (ref 1.15–1.40)
HCT: 37 % — ABNORMAL LOW (ref 39.0–52.0)
HCT: 37 % — ABNORMAL LOW (ref 39.0–52.0)
HCT: 39 % (ref 39.0–52.0)
Hemoglobin: 12.6 g/dL — ABNORMAL LOW (ref 13.0–17.0)
Hemoglobin: 12.6 g/dL — ABNORMAL LOW (ref 13.0–17.0)
Hemoglobin: 13.3 g/dL (ref 13.0–17.0)
O2 Saturation: 99 %
O2 Saturation: 98 %
O2 Saturation: 100 %
Potassium: 3.7 mmol/L (ref 3.5–5.1)
Potassium: 4.3 mmol/L (ref 3.5–5.1)
Potassium: 3.8 mmol/L (ref 3.5–5.1)
Sodium: 143 mmol/L (ref 135–145)
Sodium: 140 mmol/L (ref 135–145)
Sodium: 143 mmol/L (ref 135–145)
TCO2: 23 mmol/L (ref 22–32)
TCO2: 25 mmol/L (ref 22–32)
TCO2: 27 mmol/L (ref 22–32)
pCO2 arterial: 45.4 mmHg (ref 32.0–48.0)
pCO2 arterial: 39.3 mmHg (ref 32.0–48.0)
pCO2 arterial: 44.7 mmHg (ref 32.0–48.0)
pH, Arterial: 7.279 — ABNORMAL LOW (ref 7.350–7.450)
pH, Arterial: 7.383 (ref 7.350–7.450)
pH, Arterial: 7.37 (ref 7.350–7.450)
pO2, Arterial: 185 mmHg — ABNORMAL HIGH (ref 83.0–108.0)
pO2, Arterial: 99 mmHg (ref 83.0–108.0)
pO2, Arterial: 214 mmHg — ABNORMAL HIGH (ref 83.0–108.0)

## 2017-06-18 LAB — CBC
HCT: 39.5 % (ref 39.0–52.0)
Hemoglobin: 12.6 g/dL — ABNORMAL LOW (ref 13.0–17.0)
MCH: 27.4 pg (ref 26.0–34.0)
MCHC: 31.9 g/dL (ref 30.0–36.0)
MCV: 85.9 fL (ref 78.0–100.0)
PLATELETS: 208 10*3/uL (ref 150–400)
RBC: 4.6 MIL/uL (ref 4.22–5.81)
RDW: 14.7 % (ref 11.5–15.5)
WBC: 16 10*3/uL — ABNORMAL HIGH (ref 4.0–10.5)

## 2017-06-18 LAB — BASIC METABOLIC PANEL
Anion gap: 14 (ref 5–15)
BUN: 16 mg/dL (ref 6–20)
CALCIUM: 8.3 mg/dL — AB (ref 8.9–10.3)
CO2: 20 mmol/L — ABNORMAL LOW (ref 22–32)
CREATININE: 1.21 mg/dL (ref 0.61–1.24)
Chloride: 105 mmol/L (ref 101–111)
GFR calc Af Amer: >60 mL/min (ref >60)
GFR, EST NON AFRICAN AMERICAN: 56 mL/min — AB (ref >60)
GLUCOSE: 192 mg/dL — AB (ref 65–99)
POTASSIUM: 4.1 mmol/L (ref 3.5–5.1)
SODIUM: 139 mmol/L (ref 135–145)

## 2017-06-18 LAB — POCT I-STAT 3, ART BLOOD GAS (G3+)
Acid-base deficit: 2 mmol/L (ref 0.0–2.0)
Acid-base deficit: 3 mmol/L — ABNORMAL HIGH (ref 0.0–2.0)
Bicarbonate: 22.5 mmol/L (ref 20.0–28.0)
Bicarbonate: 21.6 mmol/L (ref 20.0–28.0)
O2 Saturation: 97 %
O2 Saturation: 96 %
Patient temperature: 97.5
TCO2: 24 mmol/L (ref 22–32)
TCO2: 23 mmol/L (ref 22–32)
pCO2 arterial: 37.4 mmHg (ref 32.0–48.0)
pCO2 arterial: 37.9 mmHg (ref 32.0–48.0)
pH, Arterial: 7.384 (ref 7.350–7.450)
pH, Arterial: 7.364 (ref 7.350–7.450)
pO2, Arterial: 92 mmHg (ref 83.0–108.0)
pO2, Arterial: 82 mmHg — ABNORMAL LOW (ref 83.0–108.0)

## 2017-06-18 LAB — GLUCOSE, CAPILLARY
Glucose-Capillary: 218 mg/dL — ABNORMAL HIGH (ref 65–99)
Glucose-Capillary: 231 mg/dL — ABNORMAL HIGH (ref 65–99)
Glucose-Capillary: 212 mg/dL — ABNORMAL HIGH (ref 65–99)

## 2017-06-18 LAB — PREPARE RBC (CROSSMATCH)

## 2017-06-18 IMAGING — DX DG CHEST 1V PORT
1 series · 1 of 1 positions shown · non-contrast
Comparison: [DATE]

CLINICAL DATA: Status post esophagectomy

EXAM:
PORTABLE CHEST 1 VIEW

[chest ap]
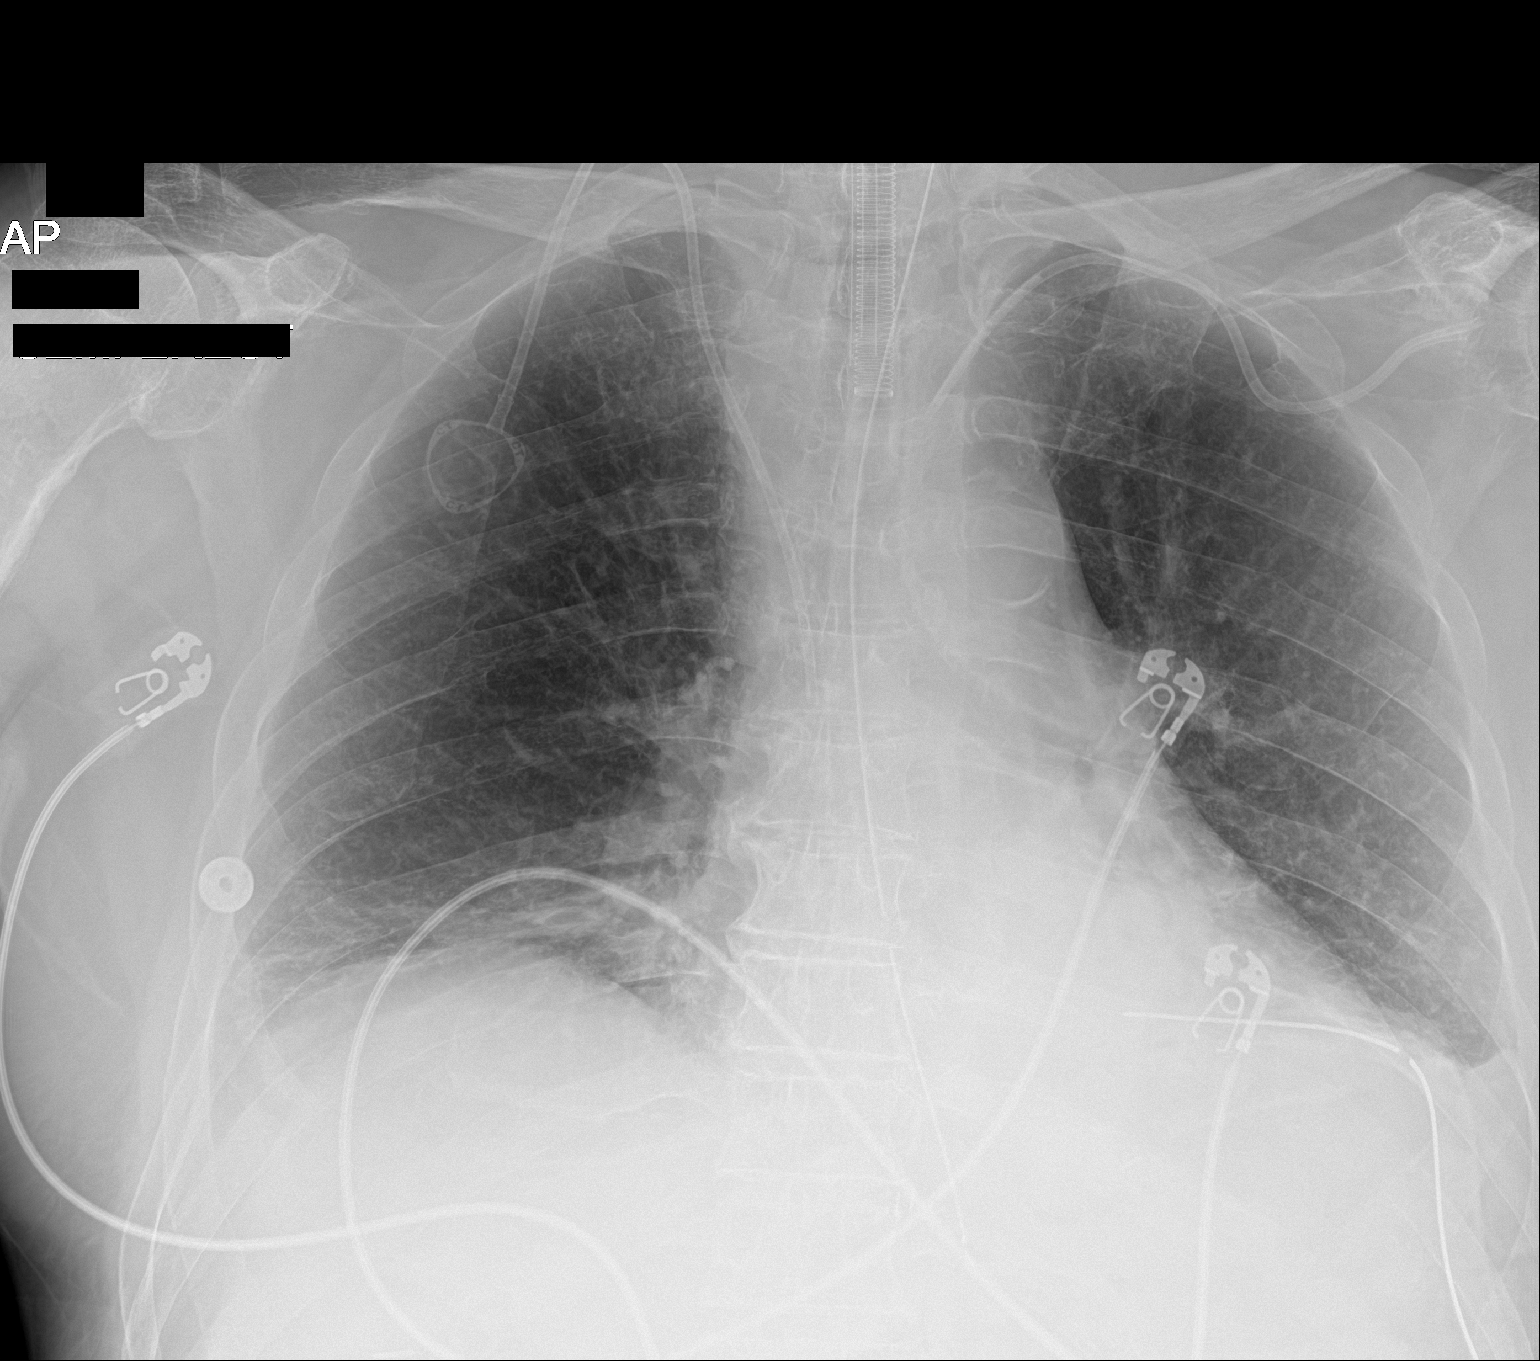

[1 of 1 positions shown; findings below may reference images not displayed]

FINDINGS: Right chest wall port is again seen. Endotracheal tube and
nasogastric catheter are noted. The nasogastric catheter tip is at
the level of the diaphragm. Bibasilar atelectatic changes are noted.
Left thoracostomy tube is seen without evidence of pneumothorax.
Minimal pleural effusions are noted bilaterally. Left central venous
line is noted in the left innominate vein. No pneumothorax is seen.
IMPRESSION: Postsurgical changes consistent with the given clinical history.

## 2017-06-18 IMAGING — CR DG CHEST 2V
2 series · 2 of 2 positions shown · non-contrast
Comparison: [DATE] chest CT.

CLINICAL DATA: 76 y/o M; preop for esophageal surgery. Esophageal
cancer.

EXAM:
CHEST - 2 VIEW

[w chest pa]
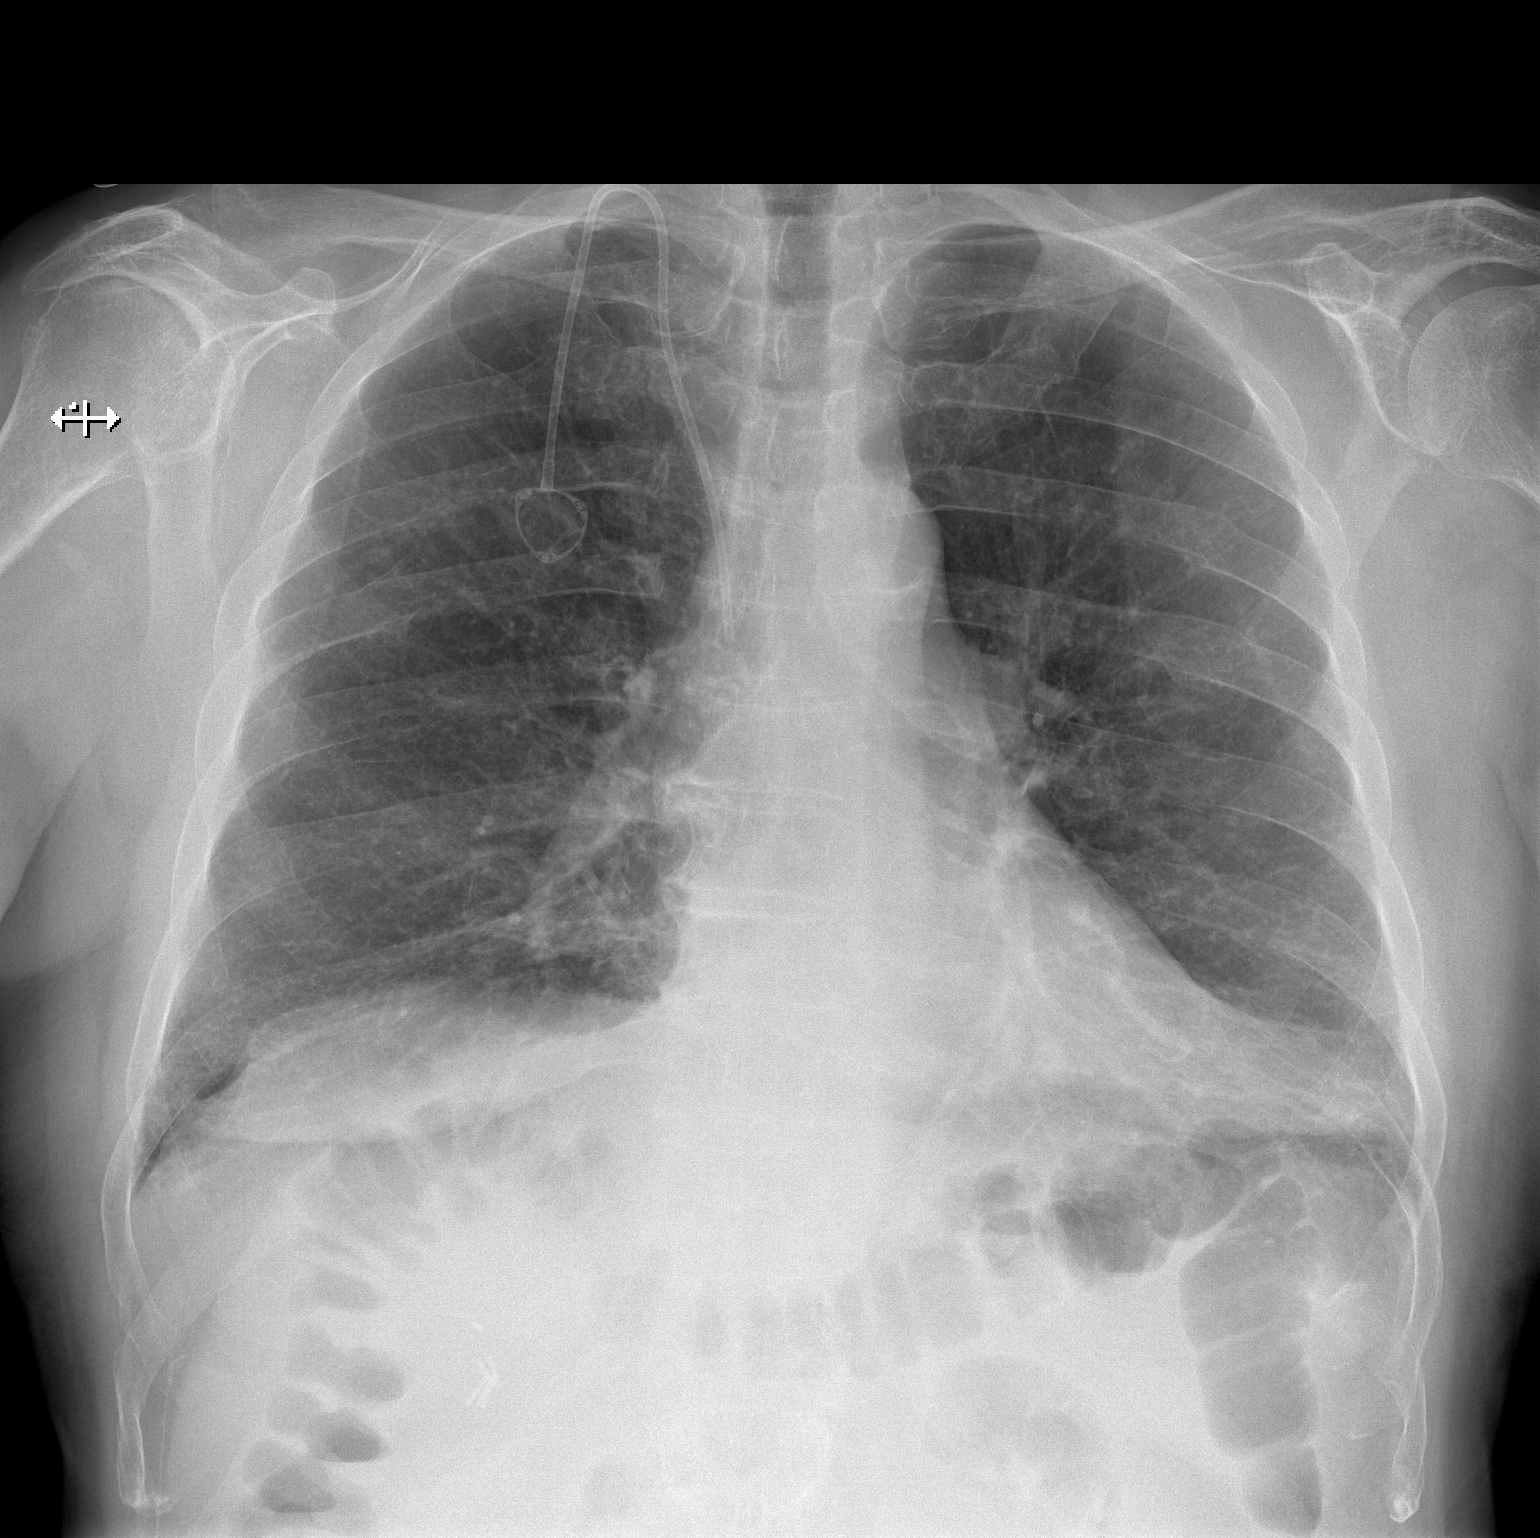

[w chest lat]
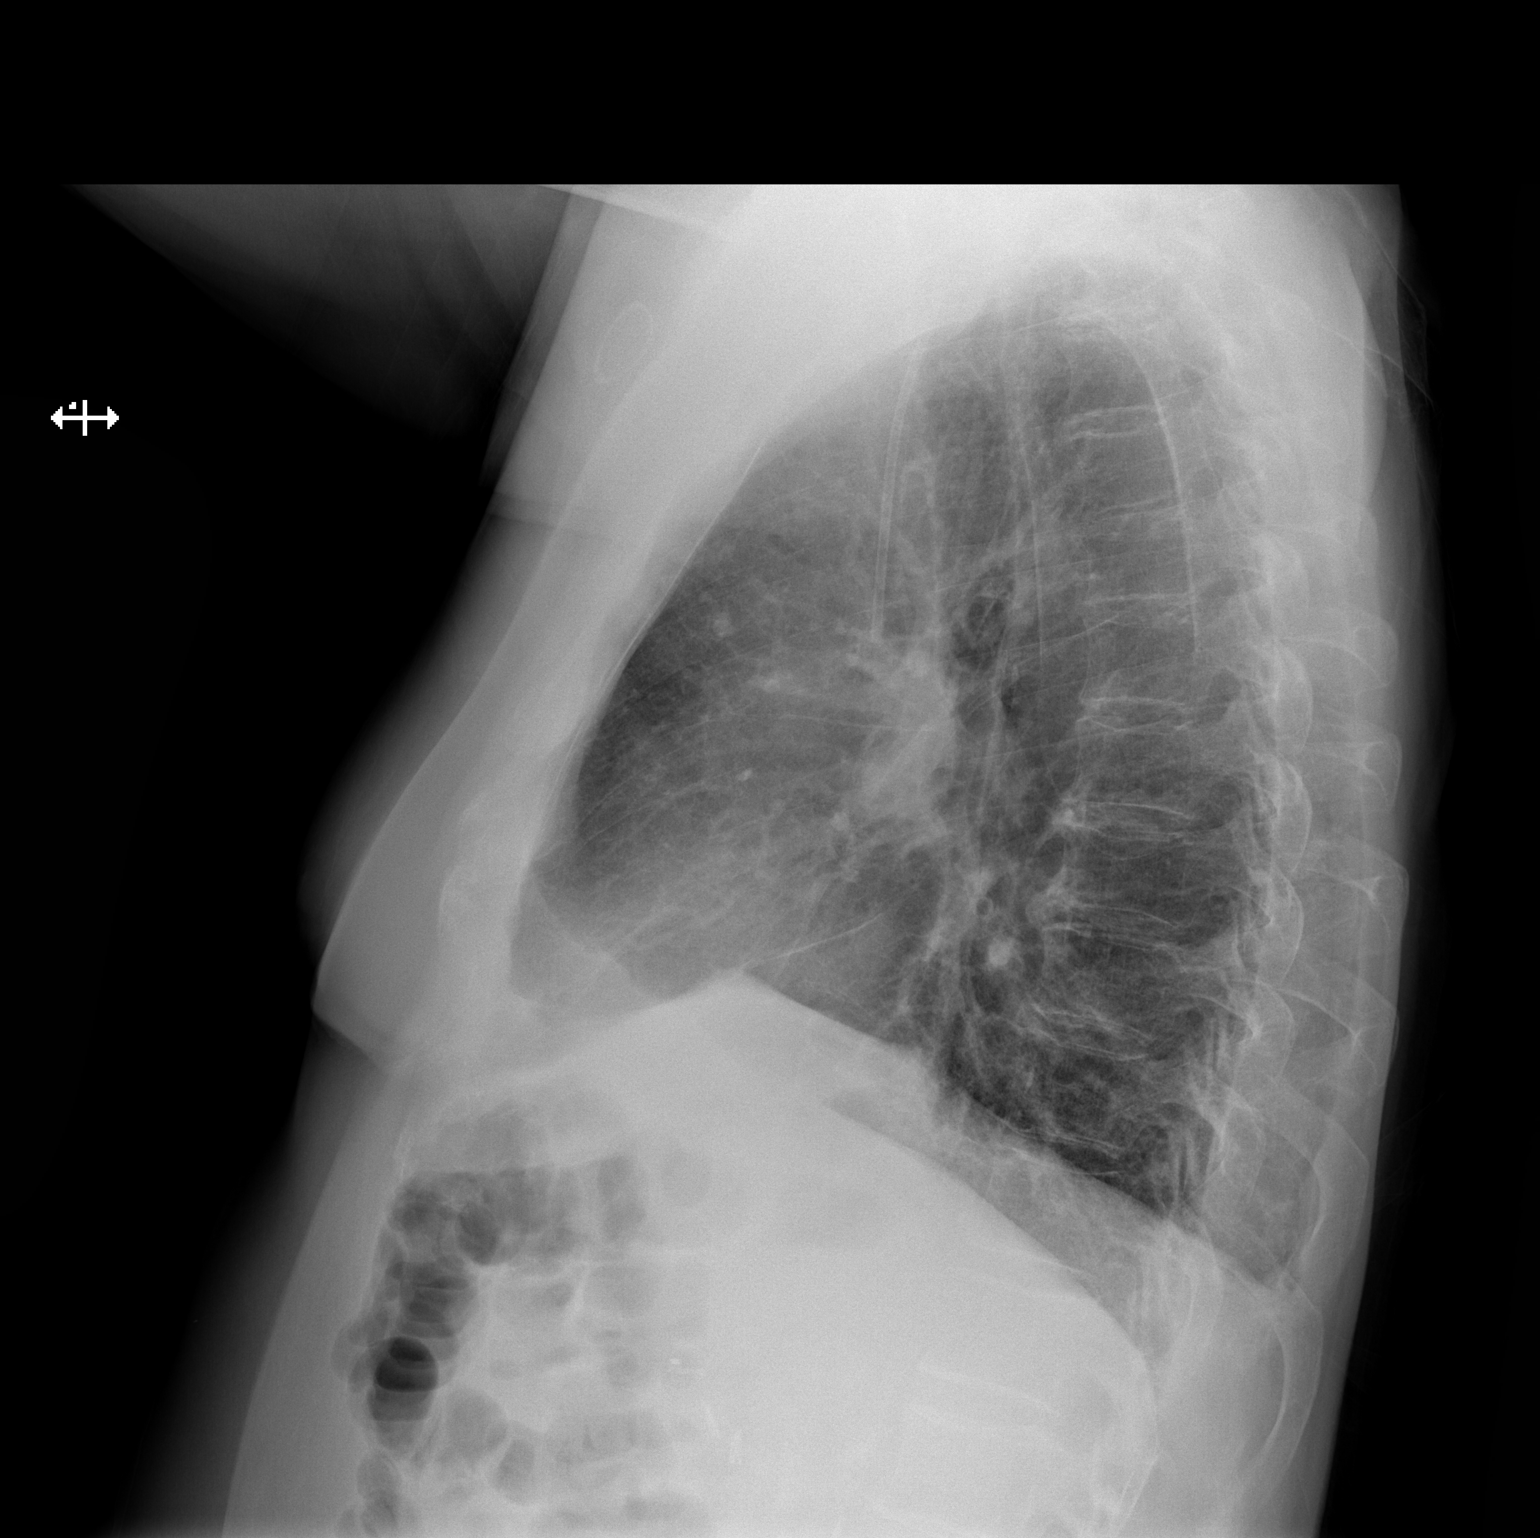

[2 of 2 positions shown; findings below may reference images not displayed]

FINDINGS: Stable normal cardiac silhouette given projection and technique.
Aortic atherosclerosis with calcification. Clear lungs. No pleural
effusion or pneumothorax. No acute osseous abnormality is evident.
Right upper quadrant cholecystectomy clips. Right port catheter tip
projects over mid SVC.
IMPRESSION: No acute pulmonary process identified.

By: ANNA LORRAINE M.D.

## 2017-06-18 SURGERY — ESOPHAGECTOMY, TOTAL
Anesthesia: General | Site: Chest

## 2017-06-18 MED ORDER — ORAL CARE MOUTH RINSE
15.0000 mL | Freq: Two times a day (BID) | OROMUCOSAL | Status: DC
  Administered 2017-06-18 – 2017-06-28 (×17): 15 mL via OROMUCOSAL

## 2017-06-18 MED ORDER — SODIUM CHLORIDE 0.9 % IV SOLN
2.0000 g | Freq: Four times a day (QID) | INTRAVENOUS | Status: AC
  Administered 2017-06-18 – 2017-06-19 (×3): 2 g via INTRAVENOUS
  Filled 2017-06-18 (×3): qty 2

## 2017-06-18 MED ORDER — ONDANSETRON HCL 4 MG/2ML IJ SOLN
4.0000 mg | Freq: Four times a day (QID) | INTRAMUSCULAR | Status: DC | PRN
  Administered 2017-06-21 (×2): 4 mg via INTRAVENOUS
  Filled 2017-06-18 (×2): qty 2

## 2017-06-18 MED ORDER — SUCCINYLCHOLINE CHLORIDE 20 MG/ML IJ SOLN
INTRAMUSCULAR | Status: DC | PRN
  Administered 2017-06-18: 100 mg via INTRAVENOUS

## 2017-06-18 MED ORDER — FENTANYL CITRATE (PF) 100 MCG/2ML IJ SOLN
INTRAMUSCULAR | Status: DC | PRN
  Administered 2017-06-18 (×2): 25 ug via INTRAVENOUS
  Administered 2017-06-18 (×3): 50 ug via INTRAVENOUS
  Administered 2017-06-18 (×2): 25 ug via INTRAVENOUS

## 2017-06-18 MED ORDER — METOCLOPRAMIDE HCL 5 MG/ML IJ SOLN
10.0000 mg | Freq: Four times a day (QID) | INTRAMUSCULAR | Status: DC
  Administered 2017-06-18 – 2017-06-23 (×19): 10 mg via INTRAVENOUS
  Filled 2017-06-18 (×19): qty 2

## 2017-06-18 MED ORDER — SODIUM BICARBONATE 8.4 % IV SOLN
INTRAVENOUS | Status: DC | PRN
  Administered 2017-06-18 (×2): 50 meq via INTRAVENOUS

## 2017-06-18 MED ORDER — MIDAZOLAM HCL 2 MG/2ML IJ SOLN
INTRAMUSCULAR | Status: AC
  Filled 2017-06-18: qty 2

## 2017-06-18 MED ORDER — FENTANYL CITRATE (PF) 250 MCG/5ML IJ SOLN
INTRAMUSCULAR | Status: AC
  Filled 2017-06-18: qty 5

## 2017-06-18 MED ORDER — 0.9 % SODIUM CHLORIDE (POUR BTL) OPTIME
TOPICAL | Status: DC | PRN
  Administered 2017-06-18: 1000 mL

## 2017-06-18 MED ORDER — LACTATED RINGERS IV SOLN
INTRAVENOUS | Status: DC | PRN
  Administered 2017-06-18: 07:00:00 via INTRAVENOUS

## 2017-06-18 MED ORDER — FENTANYL 40 MCG/ML IV SOLN
INTRAVENOUS | Status: DC
  Administered 2017-06-18: 1000 ug via INTRAVENOUS
  Administered 2017-06-19: 40 ug via INTRAVENOUS
  Administered 2017-06-19: 110 ug via INTRAVENOUS
  Administered 2017-06-19: 90 ug via INTRAVENOUS
  Administered 2017-06-19: 120 ug via INTRAVENOUS
  Administered 2017-06-19 (×2): 70 ug via INTRAVENOUS
  Administered 2017-06-20: 80 ug via INTRAVENOUS
  Administered 2017-06-20: 110 ug via INTRAVENOUS
  Administered 2017-06-20: 1000 ug via INTRAVENOUS
  Administered 2017-06-20: 110 ug via INTRAVENOUS
  Administered 2017-06-20: 60 ug via INTRAVENOUS
  Administered 2017-06-20: 100 ug via INTRAVENOUS
  Administered 2017-06-21: 30 ug via INTRAVENOUS
  Administered 2017-06-21: 90 ug via INTRAVENOUS
  Administered 2017-06-21: 40 ug via INTRAVENOUS
  Administered 2017-06-22: 10 ug via INTRAVENOUS
  Administered 2017-06-22: 80 ug via INTRAVENOUS
  Administered 2017-06-23: 10 ug via INTRAVENOUS
  Administered 2017-06-23: 3 ug via INTRAVENOUS
  Administered 2017-06-23: 10 ug via INTRAVENOUS
  Administered 2017-06-23 – 2017-06-24 (×2): 40 ug via INTRAVENOUS
  Administered 2017-06-24: 0 ug via INTRAVENOUS
  Administered 2017-06-24: 80 ug via INTRAVENOUS
  Administered 2017-06-24: 0 ug via INTRAVENOUS
  Administered 2017-06-24: 100 ug via INTRAVENOUS
  Administered 2017-06-24: 20 ug via INTRAVENOUS
  Administered 2017-06-25: 30 ug via INTRAVENOUS
  Administered 2017-06-25: 60 ug via INTRAVENOUS
  Administered 2017-06-25: 50 ug via INTRAVENOUS
  Administered 2017-06-25: 90 ug via INTRAVENOUS
  Administered 2017-06-25 (×2): 30 ug via INTRAVENOUS
  Administered 2017-06-26: 40 ug via INTRAVENOUS
  Administered 2017-06-26: 21:00:00 via INTRAVENOUS
  Administered 2017-06-26: 50 ug via INTRAVENOUS
  Administered 2017-06-26: 60 ug via INTRAVENOUS
  Administered 2017-06-26: 20 ug via INTRAVENOUS
  Administered 2017-06-26: 40 ug via INTRAVENOUS
  Administered 2017-06-27: 30 ug via INTRAVENOUS
  Administered 2017-06-27: 60 ug via INTRAVENOUS
  Filled 2017-06-18 (×4): qty 25
  Filled 2017-06-18: qty 1000

## 2017-06-18 MED ORDER — MIDAZOLAM HCL 5 MG/5ML IJ SOLN
INTRAMUSCULAR | Status: DC | PRN
  Administered 2017-06-18 (×2): 1 mg via INTRAVENOUS

## 2017-06-18 MED ORDER — INSULIN ASPART 100 UNIT/ML ~~LOC~~ SOLN
0.0000 [IU] | SUBCUTANEOUS | Status: DC
  Administered 2017-06-18 (×2): 8 [IU] via SUBCUTANEOUS
  Administered 2017-06-19 (×2): 2 [IU] via SUBCUTANEOUS
  Administered 2017-06-19: 8 [IU] via SUBCUTANEOUS
  Administered 2017-06-19 – 2017-06-26 (×14): 2 [IU] via SUBCUTANEOUS
  Administered 2017-06-27: 4 [IU] via SUBCUTANEOUS
  Administered 2017-06-27 – 2017-06-28 (×3): 2 [IU] via SUBCUTANEOUS

## 2017-06-18 MED ORDER — ALBUTEROL SULFATE (2.5 MG/3ML) 0.083% IN NEBU: 2.5000 mg | INHALATION_SOLUTION | RESPIRATORY_TRACT | Status: AC | PRN

## 2017-06-18 MED ORDER — PANTOPRAZOLE SODIUM 40 MG IV SOLR
40.0000 mg | Freq: Two times a day (BID) | INTRAVENOUS | Status: DC
  Administered 2017-06-18 – 2017-06-25 (×15): 40 mg via INTRAVENOUS
  Filled 2017-06-18 (×15): qty 40

## 2017-06-18 MED ORDER — SODIUM CHLORIDE 0.9 % IV SOLN
2.0000 g | INTRAVENOUS | Status: DC
  Filled 2017-06-18 (×2): qty 2

## 2017-06-18 MED ORDER — PROPOFOL 10 MG/ML IV BOLUS
INTRAVENOUS | Status: AC
  Filled 2017-06-18: qty 40

## 2017-06-18 MED ORDER — DIPHENHYDRAMINE HCL 50 MG/ML IJ SOLN: 12.5000 mg | Freq: Four times a day (QID) | INTRAMUSCULAR | Status: DC | PRN

## 2017-06-18 MED ORDER — ALBUMIN HUMAN 5 % IV SOLN
INTRAVENOUS | Status: DC | PRN
  Administered 2017-06-18 (×3): via INTRAVENOUS

## 2017-06-18 MED ORDER — DIPHENHYDRAMINE HCL 12.5 MG/5ML PO ELIX
12.5000 mg | ORAL_SOLUTION | Freq: Four times a day (QID) | ORAL | Status: DC | PRN
  Filled 2017-06-18: qty 5

## 2017-06-18 MED ORDER — PROPOFOL 10 MG/ML IV BOLUS
INTRAVENOUS | Status: DC | PRN
  Administered 2017-06-18: 30 mg via INTRAVENOUS
  Administered 2017-06-18: 110 mg via INTRAVENOUS

## 2017-06-18 MED ORDER — DEXMEDETOMIDINE HCL IN NACL 400 MCG/100ML IV SOLN
INTRAVENOUS | Status: DC | PRN
  Administered 2017-06-18: .5 ug/kg/h via INTRAVENOUS

## 2017-06-18 MED ORDER — CHLORHEXIDINE GLUCONATE 0.12 % MT SOLN
15.0000 mL | OROMUCOSAL | Status: AC
  Administered 2017-06-18: 15 mL via OROMUCOSAL
  Filled 2017-06-18: qty 15

## 2017-06-18 MED ORDER — PHENYLEPHRINE HCL 10 MG/ML IJ SOLN
INTRAVENOUS | Status: DC | PRN
  Administered 2017-06-18: 50 ug/min via INTRAVENOUS

## 2017-06-18 MED ORDER — POTASSIUM CHLORIDE 10 MEQ/50ML IV SOLN: 10.0000 meq | INTRAVENOUS | Status: DC | PRN

## 2017-06-18 MED ORDER — DEXTROSE-NACL 5-0.45 % IV SOLN
INTRAVENOUS | Status: DC
  Administered 2017-06-18: 20:00:00 via INTRAVENOUS
  Administered 2017-06-18: 500 mL via INTRAVENOUS
  Administered 2017-06-19 – 2017-06-20 (×2): via INTRAVENOUS
  Administered 2017-06-20 (×2): 75 mL/h via INTRAVENOUS
  Administered 2017-06-22 – 2017-06-24 (×4): via INTRAVENOUS

## 2017-06-18 MED ORDER — SODIUM CHLORIDE 0.9% FLUSH
9.0000 mL | INTRAVENOUS | Status: DC | PRN
  Administered 2017-06-24: 9 mL via INTRAVENOUS
  Filled 2017-06-18: qty 9

## 2017-06-18 MED ORDER — DEXAMETHASONE SODIUM PHOSPHATE 10 MG/ML IJ SOLN
INTRAMUSCULAR | Status: DC | PRN
  Administered 2017-06-18: 8 mg via INTRAVENOUS

## 2017-06-18 MED ORDER — LACTATED RINGERS IV SOLN
INTRAVENOUS | Status: DC | PRN
  Administered 2017-06-18: 08:00:00 via INTRAVENOUS

## 2017-06-18 MED ORDER — DEXMEDETOMIDINE HCL IN NACL 400 MCG/100ML IV SOLN
0.0000 ug/kg/h | INTRAVENOUS | Status: DC
  Filled 2017-06-18: qty 100

## 2017-06-18 MED ORDER — SODIUM CHLORIDE 0.9 % IV SOLN
2.0000 g | INTRAVENOUS | Status: DC
  Filled 2017-06-18: qty 2

## 2017-06-18 MED ORDER — ROCURONIUM BROMIDE 100 MG/10ML IV SOLN
INTRAVENOUS | Status: DC | PRN
  Administered 2017-06-18: 50 mg via INTRAVENOUS
  Administered 2017-06-18: 15 mg via INTRAVENOUS
  Administered 2017-06-18: 20 mg via INTRAVENOUS

## 2017-06-18 MED ORDER — NALOXONE HCL 0.4 MG/ML IJ SOLN: 0.4000 mg | INTRAMUSCULAR | Status: DC | PRN

## 2017-06-18 MED ORDER — SODIUM CHLORIDE 0.9 % IV SOLN
2.0000 g | INTRAVENOUS | Status: AC
  Administered 2017-06-18 (×3): 2 g via INTRAVENOUS
  Filled 2017-06-18: qty 2

## 2017-06-18 MED ORDER — EPHEDRINE SULFATE 50 MG/ML IJ SOLN
INTRAMUSCULAR | Status: DC | PRN
  Administered 2017-06-18: 10 mg via INTRAVENOUS

## 2017-06-18 MED ORDER — PHENYLEPHRINE HCL 10 MG/ML IJ SOLN
INTRAMUSCULAR | Status: DC | PRN
  Administered 2017-06-18 (×6): 80 ug via INTRAVENOUS

## 2017-06-18 MED ORDER — MORPHINE SULFATE (PF) 2 MG/ML IV SOLN: 1.0000 mg | INTRAVENOUS | Status: DC | PRN

## 2017-06-18 MED ORDER — SODIUM CHLORIDE 0.9 % IV SOLN
2.0000 g | INTRAVENOUS | Status: AC
  Filled 2017-06-18: qty 2

## 2017-06-18 MED ORDER — ONDANSETRON HCL 4 MG/2ML IJ SOLN: 4.0000 mg | INTRAMUSCULAR | Status: DC | PRN

## 2017-06-18 SURGICAL SUPPLY — 145 items
ADH SKN CLS APL DERMABOND .7 (GAUZE/BANDAGES/DRESSINGS) ×2
ADH SKN CLS LQ APL DERMABOND (GAUZE/BANDAGES/DRESSINGS) ×2
BLADE SURG 10 STRL SS (BLADE) ×1 IMPLANT
BRUSH CYTOL CELLEBRITY 1.5X140 (MISCELLANEOUS) IMPLANT
CANISTER SUCT 3000ML PPV (MISCELLANEOUS) ×7 IMPLANT
CATH FOLEY 2WAY SLVR 18FR 30CC (CATHETERS) ×3 IMPLANT
CATH ROBINSON RED A/P 18FR (CATHETERS) ×3 IMPLANT
CATH THORACIC 28FR (CATHETERS) ×1 IMPLANT
CLIP FOGARTY SPRING 6M (CLIP) ×3 IMPLANT
CLIP VESOCCLUDE LG 6/CT (CLIP) IMPLANT
CLIP VESOCCLUDE MED 24/CT (CLIP) ×3 IMPLANT
CLIP VESOCCLUDE SM WIDE 24/CT (CLIP) ×3 IMPLANT
CONT SPEC 4OZ CLIKSEAL STRL BL (MISCELLANEOUS) ×6 IMPLANT
COUNTER NEEDLE 20 DBL MAG RED (NEEDLE) ×1 IMPLANT
COVER BACK TABLE 60X90IN (DRAPES) ×3 IMPLANT
COVER PROBE W GEL 5X96 (DRAPES) ×4 IMPLANT
COVER SURGICAL LIGHT HANDLE (MISCELLANEOUS) ×2 IMPLANT
DERMABOND ADHESIVE PROPEN (GAUZE/BANDAGES/DRESSINGS) ×1
DERMABOND ADVANCED (GAUZE/BANDAGES/DRESSINGS) ×1
DERMABOND ADVANCED .7 DNX12 (GAUZE/BANDAGES/DRESSINGS) ×4 IMPLANT
DERMABOND ADVANCED .7 DNX6 (GAUZE/BANDAGES/DRESSINGS) IMPLANT
DRAIN CHANNEL 28F RND 3/8 FF (WOUND CARE) ×2 IMPLANT
DRAIN PENROSE 1/2X12 LTX STRL (WOUND CARE) ×1 IMPLANT
DRAIN PENROSE 1/2X36 STERILE (WOUND CARE) ×3 IMPLANT
DRAIN PENROSE 1/4X12 LTX STRL (WOUND CARE) ×1 IMPLANT
DRAIN SUMP SARATOGA 24F (WOUND CARE) ×3 IMPLANT
DRAPE CAMERA VIDEO/LASER (DRAPES) ×2 IMPLANT
DRAPE CV SPLIT W-CLR ANES SCRN (DRAPES) ×3 IMPLANT
DRAPE INCISE IOBAN 66X45 STRL (DRAPES) ×5 IMPLANT
DRAPE PERI GROIN 82X75IN TIB (DRAPES) ×3 IMPLANT
DRAPE SLUSH/WARMER DISC (DRAPES) IMPLANT
DRAPE WARM FLUID 44X44 (DRAPE) ×1 IMPLANT
DRSG AQUACEL AG ADV 3.5X14 (GAUZE/BANDAGES/DRESSINGS) ×3 IMPLANT
DRSG PAD ABDOMINAL 8X10 ST (GAUZE/BANDAGES/DRESSINGS) ×1 IMPLANT
ELECT BLADE 4.0 EZ CLEAN MEGAD (MISCELLANEOUS) ×3
ELECT BLADE 6.5 EXT (BLADE) ×1 IMPLANT
ELECT NDL TIP 2.8 STRL (NEEDLE) IMPLANT
ELECT NEEDLE TIP 2.8 STRL (NEEDLE) ×3 IMPLANT
ELECT REM PT RETURN 9FT ADLT (ELECTROSURGICAL) ×6
ELECTRODE BLDE 4.0 EZ CLN MEGD (MISCELLANEOUS) ×2 IMPLANT
ELECTRODE REM PT RTRN 9FT ADLT (ELECTROSURGICAL) ×4 IMPLANT
FORCEPS BIOP RJ4 1.8 (CUTTING FORCEPS) IMPLANT
GAUZE SPONGE 4X4 12PLY STRL (GAUZE/BANDAGES/DRESSINGS) ×3 IMPLANT
GAUZE SPONGE 4X4 12PLY STRL LF (GAUZE/BANDAGES/DRESSINGS) ×1 IMPLANT
GLOVE BIO SURGEON STRL SZ 6 (GLOVE) ×2 IMPLANT
GLOVE BIO SURGEON STRL SZ 6.5 (GLOVE) ×12 IMPLANT
GLOVE BIOGEL PI IND STRL 6.5 (GLOVE) IMPLANT
GLOVE BIOGEL PI INDICATOR 6.5 (GLOVE) ×3
GLOVE SURG SS PI 6.5 STRL IVOR (GLOVE) ×1 IMPLANT
GLOVE SURG SS PI 7.5 STRL IVOR (GLOVE) ×1 IMPLANT
GOWN STRL REUS W/ TWL LRG LVL3 (GOWN DISPOSABLE) ×6 IMPLANT
GOWN STRL REUS W/TWL LRG LVL3 (GOWN DISPOSABLE) ×12
HANDLE STAPLE ENDO GIA SHORT (STAPLE) ×1
HANDLE SUCTION POOLE (INSTRUMENTS) IMPLANT
HEMOSTAT SURGICEL 2X14 (HEMOSTASIS) IMPLANT
INSERT FOGARTY 61MM (MISCELLANEOUS) IMPLANT
KIT BASIN OR (CUSTOM PROCEDURE TRAY) ×3 IMPLANT
KIT CLEAN ENDO COMPLIANCE (KITS) ×3 IMPLANT
KIT SUCTION CATH 14FR (SUCTIONS) ×3 IMPLANT
KIT TURNOVER KIT B (KITS) ×2 IMPLANT
LOOP VESSEL MAXI BLUE (MISCELLANEOUS) ×1 IMPLANT
LOOP VESSEL MINI RED (MISCELLANEOUS) ×1 IMPLANT
MARKER SKIN DUAL TIP RULER LAB (MISCELLANEOUS) IMPLANT
NS IRRIG 1000ML POUR BTL (IV SOLUTION) ×9 IMPLANT
OIL SILICONE PENTAX (PARTS (SERVICE/REPAIRS)) ×3 IMPLANT
PACK CHEST (CUSTOM PROCEDURE TRAY) ×3 IMPLANT
PAD ARMBOARD 7.5X6 YLW CONV (MISCELLANEOUS) ×6 IMPLANT
PAD SHARPS MAGNETIC DISPOSAL (MISCELLANEOUS) ×2 IMPLANT
PENCIL BUTTON HOLSTER BLD 10FT (ELECTRODE) ×1 IMPLANT
PLUG CATH AND CAP STER (CATHETERS) ×3 IMPLANT
PROBE NERVBE PRASS .33 (MISCELLANEOUS) ×3 IMPLANT
PROBE PENCIL 8 MHZ STRL DISP (MISCELLANEOUS) ×3 IMPLANT
RELOAD EGIA 45 MED/THCK PURPLE (STAPLE) IMPLANT
RELOAD EGIA TRIS TAN 45 CVD (STAPLE) IMPLANT
RELOAD ENDO GIA 30 3.5 (STAPLE) IMPLANT
RELOAD ENDO GIA 60 3.5 (STAPLE) ×1 IMPLANT
RELOAD LINEAR CUT PROX 55 BLUE (ENDOMECHANICALS) ×9 IMPLANT
RELOAD STAPLE 35X2.5 WHT THIN (STAPLE) IMPLANT
RELOAD STAPLE 45 TAN MED CVD (STAPLE) IMPLANT
RELOAD STAPLE 55 3.8 BLU REG (ENDOMECHANICALS) ×6 IMPLANT
RETAINER VISCERA MED (MISCELLANEOUS) ×3 IMPLANT
RETRACTOR WND ALEXIS 25 LRG (MISCELLANEOUS) ×2 IMPLANT
RTRCTR WOUND ALEXIS 25CM LRG (MISCELLANEOUS) ×3
SEALER TISSUE X1 CVD JAW (INSTRUMENTS) ×3 IMPLANT
SHEARS HARMONIC ACE PLUS 36CM (ENDOMECHANICALS) ×3 IMPLANT
SPONGE DRAIN TRACH 4X4 STRL 2S (GAUZE/BANDAGES/DRESSINGS) ×2 IMPLANT
SPONGE LAP 18X18 X RAY DECT (DISPOSABLE) ×9 IMPLANT
STAPLE RELOAD 2.5MM WHITE (STAPLE) ×12 IMPLANT
STAPLER ENDO GIA 12 SHRT THIN (STAPLE) ×2 IMPLANT
STAPLER ENDO GIA 12MM SHORT (STAPLE) ×2 IMPLANT
STAPLER PROXIMATE 55 BLUE (STAPLE) ×5 IMPLANT
STAPLER VASCULAR ECHELON 35 (CUTTER) ×1 IMPLANT
STAPLER VISISTAT 35W (STAPLE) IMPLANT
SUCTION POOLE HANDLE (INSTRUMENTS)
SUCTION POOLE TIP (SUCTIONS) IMPLANT
SUT ETHILON 3 0 FSL (SUTURE) ×3 IMPLANT
SUT ETHILON 3 0 PS 1 (SUTURE) ×2 IMPLANT
SUT PDS AB 4-0 SH 27 (SUTURE) IMPLANT
SUT PROLENE 0 CT 1 CR/8 (SUTURE) IMPLANT
SUT PROLENE 1 XLH (SUTURE) ×4 IMPLANT
SUT PROLENE 2 0 MH 48 (SUTURE) IMPLANT
SUT PROLENE 2 TP 1 (SUTURE) ×6 IMPLANT
SUT PROLENE 3 0 RB 1 (SUTURE) IMPLANT
SUT PROLENE 4 0 RB 1 (SUTURE) ×3
SUT PROLENE 4-0 RB1 .5 CRCL 36 (SUTURE) IMPLANT
SUT SILK  1 MH (SUTURE) ×1
SUT SILK 1 MH (SUTURE) ×2 IMPLANT
SUT SILK 1 TIES 10X30 (SUTURE) ×2 IMPLANT
SUT SILK 2 0 SH CR/8 (SUTURE) ×3 IMPLANT
SUT SILK 2 0SH CR/8 30 (SUTURE) ×4 IMPLANT
SUT SILK 3 0 SH CR/8 (SUTURE) IMPLANT
SUT SILK 3 0SH CR/8 30 (SUTURE) ×1 IMPLANT
SUT SILK 4 0 SH CR/8 (SUTURE) ×3 IMPLANT
SUT VIC AB 1 CTX 27 (SUTURE) IMPLANT
SUT VIC AB 2-0 CT1 18 (SUTURE) ×1 IMPLANT
SUT VIC AB 2-0 CTX 36 (SUTURE) ×3 IMPLANT
SUT VIC AB 3-0 MH 27 (SUTURE) IMPLANT
SUT VIC AB 3-0 SH 18 (SUTURE) IMPLANT
SUT VIC AB 3-0 X1 27 (SUTURE) ×7 IMPLANT
SUT VIC AB 4-0 SH 18 (SUTURE) ×9 IMPLANT
SUT VICRYL 2 TP 1 (SUTURE) IMPLANT
SYR 10ML LL (SYRINGE) IMPLANT
SYR 20CC LL (SYRINGE) IMPLANT
SYR 20ML ECCENTRIC (SYRINGE) ×3 IMPLANT
SYR 30ML SLIP (SYRINGE) ×3 IMPLANT
SYR 5ML LUER SLIP (SYRINGE) ×3 IMPLANT
SYR BULB IRRIGATION 50ML (SYRINGE) ×1 IMPLANT
SYR TOOMEY 50ML (SYRINGE) ×3 IMPLANT
SYSTEM SAHARA CHEST DRAIN RE-I (WOUND CARE) ×3 IMPLANT
TAPE CLOTH SURG 4X10 WHT LF (GAUZE/BANDAGES/DRESSINGS) ×1 IMPLANT
TAPE UMBILICAL 1/8 X36 TWILL (MISCELLANEOUS) ×3 IMPLANT
TAPE UMBILICAL COTTON 1/8X30 (MISCELLANEOUS) ×1 IMPLANT
TOWEL GREEN STERILE (TOWEL DISPOSABLE) ×3 IMPLANT
TOWEL GREEN STERILE FF (TOWEL DISPOSABLE) ×3 IMPLANT
TRAP SPECIMEN MUCOUS 40CC (MISCELLANEOUS) IMPLANT
TRAY FOLEY W/METER SILVER 16FR (SET/KITS/TRAYS/PACK) ×3 IMPLANT
TUBE CONNECTING 12X1/4 (SUCTIONS) IMPLANT
TUBE CONNECTING 20X1/4 (TUBING) ×4 IMPLANT
TUBE ENDOTRAC EMG 7X10.2 (MISCELLANEOUS) IMPLANT
TUBE ENDOTRAC EMG 8X11.3 (MISCELLANEOUS) ×1 IMPLANT
TUBE ENDOTRACH  EMG 6MMTUBE EN (MISCELLANEOUS)
TUBE ENDOTRACH EMG 6MMTUBE EN (MISCELLANEOUS) IMPLANT
VALVE DISPOSABLE (MISCELLANEOUS) ×3 IMPLANT
WATER STERILE IRR 1000ML POUR (IV SOLUTION) ×4 IMPLANT
YANKAUER SUCT BULB TIP NO VENT (SUCTIONS) ×1 IMPLANT

## 2017-06-18 NOTE — Progress Notes (Signed)
Patient ID: Casey Acevedo, male   DOB: [birth year], 77 y.o.   MRN: [id number] TCTS Evening Rounds:  Hemodynamically stable in sinus rhythm. sats 99% on [place].  Sleepy but comfortable  CT output low. No air leak  Urine output ok.

## 2017-06-18 NOTE — Anesthesia Procedure Notes (Signed)
Arterial Line Insertion Start/End4/10/2017 6:50 AM, 06/18/2017 6:54 AM Performed by: Josie Dixon, CRNA, CRNA  Patient location: OR. Preanesthetic checklist: patient identified, IV checked, site marked, risks and benefits discussed, surgical consent, monitors and equipment checked and pre-op evaluation Lidocaine 1% used for infiltration and patient sedated Left, radial was placed Catheter size: 20 G Hand hygiene performed , maximum sterile barriers used  and Seldinger technique used Allen's test indicative of satisfactory collateral circulation Attempts: 1 Procedure performed without using ultrasound guided technique. Ultrasound Notes:anatomy identified, needle tip was noted to be adjacent to the nerve/plexus identified and no ultrasound evidence of intravascular and/or intraneural injection Following insertion, dressing applied and Biopatch. Post procedure assessment: normal  Patient tolerated the procedure well with no immediate complications.

## 2017-06-18 NOTE — Brief Op Note (Addendum)
      [address]       [place] [address]             [phone]     06/18/2017  6:05 PM  PATIENT:  Casey Acevedo  77 y.o. male  PRE-OPERATIVE DIAGNOSIS:  ESOPHAGEAL CANCER  POST-OPERATIVE DIAGNOSIS:  ESOPHAGEAL CANCER  PROCEDURE:  Procedure(s) with comments:  TRANSHIATAL ESOPHAGECTOMY COMPLETE   PYLOROMYOTOMY  VIDEO BRONCHOSCOPY (N/A)  SURGEON:  Surgeon(s) and Role:    * Grace Isaac, MD - Primary  PHYSICIAN ASSISTANT: [name] PA-C  ANESTHESIA:   general  EBL:  250 mL   BLOOD ADMINISTERED:none  DRAINS: 28 Straight Chest Tube Left Chest   LOCAL MEDICATIONS USED:  NONE  SPECIMEN:  Source of Specimen:  Esophagus, Portion of the Stomach  DISPOSITION OF SPECIMEN:  PATHOLOGY  COUNTS:  YES   DICTATION: .Dragon Dictation  PLAN OF CARE: Admit to inpatient   PATIENT DISPOSITION:  ICU - intubated and hemodynamically stable.   Delay start of Pharmacological VTE agent (>24hrs) due to surgical blood loss or risk of bleeding: yes

## 2017-06-18 NOTE — Procedures (Signed)
Extubation Procedure Note  Patient Details:   Name: Casey Acevedo DOB: 1940/07/25 MRN: [id number]   Airway Documentation:     Evaluation  O2 sats: stable throughout Complications: No apparent complications Patient did tolerate procedure well. Bilateral Breath Sounds: Diminished, Clear   Yes  4l/min [place] Incentive spirometer instructed  Revonda Standard 06/18/2017, 5:56 PM

## 2017-06-18 NOTE — Anesthesia Procedure Notes (Signed)
Central Venous Catheter Insertion Performed by: anesthesiologist Start/End4/10/2017 7:33 AM, 06/18/2017 7:53 AM Patient location: Pre-op. Preanesthetic checklist: patient identified, IV checked, site marked, risks and benefits discussed, surgical consent, monitors and equipment checked, pre-op evaluation, timeout performed and anesthesia consent Lidocaine 1% used for infiltration and patient sedated Hand hygiene performed  and maximum sterile barriers used  Catheter size: 8 Fr Total catheter length 16. Central line was placed.Double lumen Procedure performed without using ultrasound guided technique. Attempts: 1 Following insertion, dressing applied and line sutured. Post procedure assessment: blood return through all ports  Patient tolerated the procedure well with no immediate complications. Additional procedure comments: Left IJ was removed due to location being in the surgical site.  Right IJ attempted, but very small by ultrasound: unable to pass the wire.  Discussed with Dr. Servando Snare who recommended left subclavian.  Left IJ IJ removed.Marland Kitchen

## 2017-06-18 NOTE — Progress Notes (Signed)
Patient ID: Casey Acevedo, male   DOB: 06/21/1940, 77 y.o.   MRN: [id number] EVENING ROUNDS NOTE :     [address]       [place] [address]             [phone]                 Day of Surgery Procedure(s) (LRB): cervical ESOPHAGECTOMY COMPLETE (N/A) VIDEO BRONCHOSCOPY (N/A) PYLOROMYOTOMY (N/A)  Total Length of Stay:  LOS: 0 days  BP 113/69   Pulse 69   Temp (!) 97.5 F (36.4 C) (Oral)   Resp 13   Wt 180 lb (81.6 kg)   SpO2 99%   BMI 26.58 kg/m   .Intake/Output      04/07 0701 - 04/08 0700 04/08 0701 - 04/09 0700   I.V. (mL/kg)  3227.6 (39.6)   IV Piggyback  1050   Total Intake(mL/kg)  4277.6 (52.4)   Urine (mL/kg/hr)  405 (0.4)   Emesis/NG output  0   Blood  250   Chest Tube  0   Total Output  655   Net  +3622.6          . cefOXitin    . cefOXitin    . dexmedetomidine (PRECEDEX) IV infusion Stopped (06/18/17 1650)  . dextrose 5 % and 0.45% NaCl 100 mL/hr at 06/18/17 1700  . potassium chloride       Lab Results  Component Value Date   WBC 16.0 (H) 06/18/2017   HGB 12.6 (L) 06/18/2017   HCT 39.5 06/18/2017   PLT 208 06/18/2017   GLUCOSE 192 (H) 06/18/2017   TRIG 92 12/11/2016   ALT 25 06/14/2017   AST 16 06/14/2017   NA 139 06/18/2017   K 4.1 06/18/2017   CL 105 06/18/2017   CREATININE 1.21 06/18/2017   BUN 16 06/18/2017   CO2 20 (L) 06/18/2017   INR 1.00 06/14/2017   Stable, now extubated  Voice intact  Grace Isaac MD  Beeper [phone] Office [phone] 06/18/2017 6:09 PM

## 2017-06-18 NOTE — Anesthesia Procedure Notes (Addendum)
Procedure Name: Intubation Date/Time: 06/18/2017 7:35 AM Performed by: Josie Dixon, CRNA Pre-anesthesia Checklist: Patient identified, Emergency Drugs available, Suction available and Patient being monitored Patient Re-evaluated:Patient Re-evaluated prior to induction Oxygen Delivery Method: Circle system utilized Preoxygenation: Pre-oxygenation with 100% oxygen Induction Type: IV induction and Rapid sequence Laryngoscope Size: Glidescope and 4 Grade View: Grade I Tube type: Oral (NIMs tube) Tube size: 8.0 mm Number of attempts: 1 Airway Equipment and Method: Stylet Placement Confirmation: ETT inserted through vocal cords under direct vision,  positive ETCO2 and breath sounds checked- equal and bilateral Secured at: 22 cm Tube secured with: Tape Dental Injury: Teeth and Oropharynx as per pre-operative assessment

## 2017-06-18 NOTE — Anesthesia Procedure Notes (Signed)
Central Venous Catheter Insertion Performed by: Duane Boston, MD, anesthesiologist Start/End4/10/2017 6:44 AM, 06/18/2017 6:54 AM Patient location: Pre-op. Preanesthetic checklist: patient identified, IV checked, site marked, risks and benefits discussed, surgical consent, monitors and equipment checked, pre-op evaluation, timeout performed and anesthesia consent Position: Trendelenburg Lidocaine 1% used for infiltration and patient sedated Hand hygiene performed , maximum sterile barriers used  and Seldinger technique used Catheter size: 8 Fr Total catheter length 16. Central line was placed.Double lumen Procedure performed using ultrasound guided technique. Ultrasound Notes:image(s) printed for medical record Attempts: 1 Following insertion, dressing applied, line sutured and Biopatch. Post procedure assessment: blood return through all ports, free fluid flow and no air  Patient tolerated the procedure well with no immediate complications.

## 2017-06-18 NOTE — Transfer of Care (Signed)
Immediate Anesthesia Transfer of Care Note  Patient: Casey Acevedo  Procedure(s) Performed: cervical ESOPHAGECTOMY COMPLETE (N/A Abdomen) VIDEO BRONCHOSCOPY (N/A Chest) PYLOROMYOTOMY (N/A Abdomen)  Patient Location: ICU  Anesthesia Type:General  Level of Consciousness: sedated and Patient remains intubated per anesthesia plan  Airway & Oxygen Therapy: Patient remains intubated per anesthesia plan, placed on ventilator by RT.   Post-op Assessment: Report given to RN, Post -op Vital signs reviewed and stable and Patient moving all extremities  Post vital signs: Reviewed and stable  Last Vitals:  Vitals Value Taken Time  BP 136/71 06/18/2017  2:35 PM  Temp    Pulse 77 06/18/2017  2:48 PM  Resp 16 06/18/2017  2:48 PM  SpO2 98 % 06/18/2017  2:48 PM  Vitals shown include unvalidated device data.  Last Pain:  Vitals:   06/18/17 0543  TempSrc: Oral      Patients Stated Pain Goal: 5 (17/91/50 5697)  Complications: No apparent anesthesia complications

## 2017-06-18 NOTE — Progress Notes (Signed)
Nutrition  Patient on schedule for nutrition phone follow-up today.  Noted patient admission for esophagectomy.  Spoke with inpatient RD regarding patient nutritional status, regimen outpatient.    [name] B. Zenia Resides, [place], [place] Registered Dietitian [phone] (pager)

## 2017-06-18 NOTE — H&P (Signed)
[address]       [place] [address]             [phone]                    Quince D Mirarchi Adams Medical Record #[id number] Date of Birth: [birth year]  Referring: Sindy Guadeloupe, MD Primary Care: Idelle Crouch, MD   Chief Complaint: Advanced stage esophageal cancer  History of Present Illness:    KRISHNA DANCEL 77 y.o. male has been followed for diagnosis of esophageal cancer.  The patient had  3 month history of dysphagia starting in the late summer 2018  .  He underwent endoscopy at the end of September with negative biopsy for malignancy .   December 26 2016 a repeat endoscopy with dilatation was performed .  The patient notes severe chest pain following the dilatation   Repeat scans showed no evidence of perforation.  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  Treated with PICC and TPN.    Patient denies any lifelong problem with reflux, he does note that his 18 year old brother is alive with history of Barrett's Previous surgery includes only laparoscopic cholecystectomy in 2009  Patient has very few in the way of long-term medical problems, notes he takes no prescription medications, has had no previous cardiac history. He smoked briefly while in college but none for more than 50 years.  Denies any alcohol intake for at least the last 25 years.  He is better able to maintain his feeding jejunostomy tube now but is no longer leaking around the tube and skin changes have much improved, is  Patient completed course of chemotherapy December 20, he completed radiation December 24    Follow-up PET scan was done February 2019 and ct chest abdomen last week   He  continues to gain weight and strength over past month .  Patient walks up to several miles a day without difficulty.  Recently was seen by cardiology for clearance prior to  esophageal resection.   Wt Readings from Last 3 Encounters:  06/18/17 180 lb (81.6  kg)  06/14/17 180 lb (81.6 kg)  06/07/17 182 lb 12.8 oz (82.9 kg)   Current Activity/ Functional Status:  Patient is independent with mobility/ambulation, transfers, ADL's, IADL's.   Zubrod Score: At the time of surgery this patient's most appropriate activity status/level should be described as: []     0    Normal activity, no symptoms [x]     1    Restricted in physical strenuous activity but ambulatory, able to do out light work []     2    Ambulatory and capable of self care, unable to do work activities, up and about               >50 % of waking hours                              []     3    Only limited self care, in bed greater than 50% of waking hours []     4    Completely disabled, no self care, confined to bed or chair []     5    Moribund   Past Medical History:  Diagnosis Date  . Cancer ([place])    esophageal  . Dysphagia     Past Surgical History:  Procedure Laterality Date  . CHOLECYSTECTOMY    . ESOPHAGOGASTRODUODENOSCOPY (EGD) WITH PROPOFOL N/A 12/07/2016   Procedure: ESOPHAGOGASTRODUODENOSCOPY (EGD) WITH PROPOFOL;  Surgeon: Jonathon Bellows, MD;  Location: [facility] ENDOSCOPY;  Service: Gastroenterology;  Laterality: N/A;  . ESOPHAGOGASTRODUODENOSCOPY (EGD) WITH PROPOFOL N/A 12/26/2016   Procedure: ESOPHAGOGASTRODUODENOSCOPY (EGD) WITH PROPOFOL WITH DILATION;  Surgeon: Jonathon Bellows, MD;  Location: [facility] ENDOSCOPY;  Service: Gastroenterology;  Laterality: N/A;  . EYE SURGERY    . GASTROJEJUNOSTOMY N/A 01/16/2017   Procedure: LAPROSCOPIC ASSIST FEEDING JEJUNOSTOMY TUBE;  Surgeon: Johnathan Hausen, MD;  Location: [facility] ORS;  Service: General;  Laterality: N/A;  . IR FLUORO GUIDE PORT INSERTION RIGHT  01/10/2017  . IR REPLACE G-TUBE SIMPLE WO FLUORO  03/15/2017  . IR REPLC DUODEN/JEJUNO TUBE PERCUT W/FLUORO  03/05/2017  . PICC LINE INSERTION Right     Family History  Problem Relation Age of Onset  . Heart disease Father   . Diabetes Mother   . Hypertension Mother   . Heart attack  Brother 23  . Prostate cancer Neg Hx   . Bladder Cancer Neg Hx   . Kidney cancer Neg Hx    Patient spotted at age 38 with complications after gallbladder surgery, mother died at age 50 with a perforated gastric ulcer and history of breast cancer, he has 1 brother at age 36 with alive with heart trouble and diabetes and also known Barrett's esophagus.  One brother died in the [place] in 50 at age 66 of myocardial infarction.  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  No Known Allergies  Current Facility-Administered Medications  Medication Dose Route Frequency Provider Last Rate Last Dose  . cefOXitin (MEFOXIN) 2 g in sodium chloride 0.9 % 100 mL IVPB  2 g Intravenous On Call to OR Grace Isaac, MD       Facility-Administered Medications Ordered in Other Encounters  Medication Dose Route Frequency Provider Last Rate Last Dose  . 0.9 %  sodium chloride infusion   Intravenous Once Sindy Guadeloupe, MD      . fentaNYL (SUBLIMAZE) injection    Anesthesia Intra-op Josie Dixon, CRNA   25 mcg at 06/18/17 0655  . lactated ringers infusion    Continuous PRN Cho, [place], CRNA      . midazolam (VERSED) 5 MG/5ML injection    Anesthesia Intra-op Josie Dixon, CRNA   1 mg at 06/18/17 7169  . ondansetron (ZOFRAN) 8 mg, dexamethasone (DECADRON) 10 mg in sodium chloride 0.9 % 50 mL IVPB   Intravenous Once Sindy Guadeloupe, MD        Pertinent items are noted in HPI.   Review of Systems: Review of systems is reviewed with the patient again and unchanged we notes she has gained weight   Review of Systems  Constitutional: Positive for malaise/fatigue and weight loss. Negative for chills, diaphoresis and fever.  HENT: Negative.   Eyes: Negative.   Respiratory: Negative.   Cardiovascular: Negative for chest pain, palpitations, orthopnea, claudication and PND.  Gastrointestinal: Positive for abdominal pain, constipation,  diarrhea, heartburn and nausea. Negative for blood in stool and melena.  Genitourinary: Negative.   Musculoskeletal: Negative.   Skin: Negative.   Neurological: Positive for weakness.  Endo/Heme/Allergies: Negative.   Psychiatric/Behavioral: Negative.     Physical Exam: BP (!) 157/81   Pulse 87   Temp 97.8 F (36.6 C) (Oral)   Resp 18   Wt 180 lb (81.6 kg)   SpO2 96%   BMI 26.58 kg/m   Wt Readings from Last 3 Encounters:  06/18/17 180 lb (81.6 kg)  06/14/17 180 lb (81.6 kg)  06/07/17 182 lb 12.8 oz (82.9 kg)   PHYSICAL EXAMINATION: General appearance: alert and cooperative Head: Normocephalic, without obvious abnormality, atraumatic Neck: no adenopathy, no carotid bruit, no JVD, supple, symmetrical, trachea midline and thyroid not enlarged, symmetric, no tenderness/mass/nodules Lymph nodes: Cervical, supraclavicular, and axillary nodes normal. Resp: clear to auscultation bilaterally Back: symmetric, no curvature. ROM normal. No CVA tenderness. Cardio: regular rate and rhythm, S1, S2 normal, no murmur, click, rub or gallop GI: soft, non-tender; bowel sounds normal; no masses,  no organomegaly Extremities: extremities normal, atraumatic, no cyanosis or edema Neurologic: Grossly normal Jejunostomy feeding tube is in place the surrounding irritation is much improved  Diagnostic Studies & Laboratory data:     Recent Radiology Findings:   Dg Chest 2 View  Result Date: 06/18/2017 CLINICAL DATA:  77 y/o M; preop for esophageal surgery. Esophageal cancer. EXAM: CHEST - 2 VIEW COMPARISON:  06/11/2017 chest CT. FINDINGS: Stable normal cardiac  silhouette given projection and technique. Aortic atherosclerosis with calcification. Clear lungs. No pleural effusion or pneumothorax. No acute osseous abnormality is evident. Right upper quadrant cholecystectomy clips. Right port catheter tip projects over mid SVC. IMPRESSION: No acute pulmonary process identified. Electronically Signed   By: Kristine Garbe M.D.   On: 06/18/2017 06:32   Ct Chest W Contrast/ Ct Abdomen W Contrast  Result Date: 06/11/2017 CLINICAL DATA:  Esophageal cancer. 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 CONTRAST:  17mL ISOVUE-300 IOPAMIDOL (ISOVUE-300) INJECTION 61% COMPARISON:  PET-CT 04/13/2017, 01/03/2017 FINDINGS: CT CHEST FINDINGS Cardiovascular: Coronary artery calcification and aortic atherosclerotic calcification. Mediastinum/Nodes: No axillary supraclavicular adenopathy. Port in the anterior chest wall with tip in distal SVC. Esophagus is normal contour without evidence obstruction, thickening or mass. No paraesophageal lymph nodes identified. Lungs/Pleura: No suspicious pulmonary nodularity. Mild branching reticulonodular pattern in the RIGHT upper lobe suggest mild infection or radiation change. Musculoskeletal: No aggressive osseous lesion. CT ABDOMEN FINDINGS Hepatobiliary: Stable cyst in the LEFT hepatic lobe. Postcholecystectomy. Pancreas: Normal pancreatic parenchymal intensity. No ductal dilatation or inflammation. Spleen: Normal spleen. Adrenals/urinary tract: Adrenal glands and kidneys are normal. Stomach/Bowel: Stomach, duodenum small-bowel normal. Percutaneous jejunostomy tube in place with retention bulb within the jejunum. Limited view of the colon is unremarkable. Vascular/Lymphatic: Aortic calcification. No upper  abdominal lymphadenopathy. No gastrohepatic ligament adenopathy. Musculoskeletal: No aggressive osseous lesion IMPRESSION: 1. No evidence local recurrence of esophageal carcinoma. 2. No evidence of metastatic adenopathy, pulmonary metastasis or liver metastasis. 3. Percutaneous jejunostomy tube in proper position. 4. Mild reticulonodular pattern within the LEFT lung favored post infectious or related to radiation. Electronically Signed   By: Suzy Bouchard M.D.   On: 06/11/2017 13:51   Dg Esophagus  Result Date: 03/29/2017 CLINICAL DATA:  History of esophageal malignancy status post chemotherapy and radiation  therapy. Patient has been unable to eat solid food since October 2018 and only small amounts of thin liquids passed. The patient has vomiting if eating or drinking more than a small amount of thin liquid. Patient uses a G-tube for nurse mint and medication administration. EXAM: ESOPHOGRAM/BARIUM SWALLOW TECHNIQUE: Single contrast examination was performed using thin barium or water soluble. FLUOROSCOPY TIME:  Fluoroscopy Time:  30 seconds Radiation Exposure Index (if provided by the fluoroscopic device): 873 micro Gy per meters square Number of Acquired Spot Images: 5+ 2 video loops COMPARISON:  PET-CT study of October 24th 2018 and barium swallow examination of December 26, 2016. FINDINGS: The patient ingested a small amount of thin barium. The hypopharynx distended well. The cervical esophagus also distended well. The proximal and middle thirds of the esophagus distended well. Approximately 7 cm above the GE junction abrupt long segment tapering of the caliber of the distal esophagus to less than 5 mm was observed. This only transiently relax but never exceeded approximately 3 mm in diameter. The mid esophagus just proximal to the transition point was mildly dilated. 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 More proximally  no abnormality in the esophagus is seen. Electronically Signed   By: David  Martinique M.D.   On: 03/29/2017 10:14   Nm Pet Image Restag (ps) Skull Base To Thigh  Result Date: 04/13/2017 CLINICAL DATA:  Subsequent treatment strategy for esophageal cancer. EXAM: NUCLEAR MEDICINE PET SKULL BASE TO THIGH TECHNIQUE: 13 mCi F-18 FDG was injected intravenously. Full-ring PET imaging was performed from the skull base to thigh after the radiotracer. CT data was obtained and used for attenuation correction and anatomic localization. FASTING BLOOD GLUCOSE:  Value: 97 mg/dl COMPARISON:  01/03/2017 FINDINGS: NECK: No hypermetabolic lymph nodes in the neck. CHEST: Interval decrease and hypermetabolism in the distal esophagus with SUV max = 4.1 today compared to 8.0 previously. Esophageal dilatation has decreased in the interval. Soft tissue fullness in the distal esophagus has also decreased. No hypermetabolic mediastinal or hilar nodes. No suspicious pulmonary nodules on the CT scan. Right Port-A-Cath tip is positioned in the distal SVC. Coronary artery calcification is evident. Atherosclerotic calcification is noted in the wall of the thoracic aorta. ABDOMEN/PELVIS: No abnormal hypermetabolic activity within the liver, pancreas, adrenal glands, or spleen. No hypermetabolic lymph nodes in the abdomen or pelvis. Tiny hyper scratches that tiny hypoattenuating lesion in the dome of the left liver is stable. Gallbladder surgically absent. There is abdominal aortic atherosclerosis without aneurysm. PEG tube tip noted in the stomach. SKELETON: No focal hypermetabolic activity to suggest skeletal metastasis. IMPRESSION: 1. Interval decrease in soft tissue fullness and hypermetabolism associated with the distal esophageal neoplasm. 2. No evidence for interval development of hypermetabolic metastatic disease in the neck, chest, abdomen, or pelvis. 3. Coronary artery and Aortic Atherosclerois (ICD10-170.0) Electronically Signed   By: Misty Stanley M.D.   On: 04/13/2017 12:03   I have independently reviewed the above radiology studies  and reviewed the findings with the patient.     Nm Pet Image Initial (pi) Skull Base To Thigh  Result Date: 01/03/2017 CLINICAL DATA:  Initial treatment strategy for esophageal cancer. EXAM: NUCLEAR MEDICINE PET SKULL BASE TO THIGH TECHNIQUE: Twelve point for mCi F-18 FDG was injected intravenously. Full-ring PET imaging was performed from the skull base to thigh after the radiotracer. CT data was obtained and used for attenuation correction and anatomic localization. FASTING BLOOD GLUCOSE:  Value: 127 mg/dl COMPARISON:  CT chest abdomen pelvis 12/26/2016. FINDINGS: NECK: No hypermetabolic lymph nodes in the neck. CT images show no acute findings. CHEST: No hypermetabolic mediastinal, hilar or axillary lymph nodes. Distal esophageal mass measures approximately 2.5 x 2.7 cm with an SUV max of 8.0. No adjacent hypermetabolic adenopathy. No hypermetabolic pulmonary nodules. Atherosclerotic calcification of the arterial vasculature, including coronary arteries. Right PICC tip terminates at the SVC RA junction. Heart is mildly enlarged. No pericardial or pleural effusion. Esophagus is dilated throughout its course, to the level of the above-described esophageal mass. ABDOMEN/PELVIS: No abnormal hypermetabolism in the liver, adrenal glands, spleen or pancreas. No hypermetabolic lymph nodes. Subcentimeter low-attenuation lesion in the dome of the liver is too small to characterize. Cholecystectomy. Adrenal glands, kidneys, spleen, pancreas, stomach and bowel are grossly unremarkable. Atherosclerotic calcification of the arterial vasculature without aneurysm. Prostate is mildly enlarged. No free fluid. SKELETON: No abnormal osseous hypermetabolism. IMPRESSION: 1. Hypermetabolic distal esophageal mass with associated esophageal dilatation. No hypermetabolic adenopathy or distant metastatic disease. 2. Aortic  atherosclerosis (ICD10-170.0). Coronary artery calcification. Electronically Signed   By: Lorin Picket M.D.   On: 01/03/2017 14:44   Ct Chest Wo Contrast/ Ct Abdomen Wo Contrast  Result Date: 12/26/2016 CLINICAL DATA:  Esophageal cancer. Gastroesophageal reflux disease. Dysphagia. Endoscopy from earlier today demonstrating esophageal stenosis at approximately 40 cm from the incisors. Rule out perforation of esophagus. EXAM: CT CHEST, ABDOMEN WITH CONTRAST TECHNIQUE: Multidetector CT imaging of the chest, abdomen was performed following the standard protocol during bolus administration of intravenous contrast. CONTRAST:  75 cc of Isovue-300 COMPARISON:  Esophagram of earlier today.  Prior CTs of 12/11/2016. FINDINGS: CT CHEST FINDINGS Cardiovascular: Aortic and branch vessel atherosclerosis. Normal heart size, without pericardial effusion. Multivessel coronary artery atherosclerosis. A right-sided PICC line terminates at the high right atrium. Mediastinum/Nodes: No supraclavicular adenopathy. No mediastinal or definite hilar adenopathy, given limitations of unenhanced CT. Soft tissue fullness at the distal esophagus including on image 50/series 2. This is similar. The more proximal esophagus is dilated and contrast filled from today's esophagram. No extraluminal contrast identified. No pneumomediastinum. Lungs/Pleura: No pleural fluid. Right upper lobe subpleural calcified granuloma on image 65/series 4. 5 mm lingular nodule on image 97/series 4 is is similar to on the prior. Minimal subpleural left lower lobe nodularity at 3 mm on image 127/series 4. Not readily apparent on the prior, favored to represent a subpleural lymph node. Musculoskeletal: No acute osseous abnormality. CT ABDOMEN  FINDINGS Hepatobiliary: High left hepatic lobe subcentimeter low-density lesion is likely a cyst. Cholecystectomy, without biliary ductal dilatation. Pancreas: Pancreatic atrophy, without duct dilatation or dominant mass.  Spleen: Old granulomatous disease in the spleen. Adrenals/Urinary Tract: Normal adrenal glands. No renal calculi or hydronephrosis. Stomach/Bowel: Normal appearance of the stomach. Normal abdominal bowel loops. Vascular/Lymphatic: Aortic and branch vessel atherosclerosis. Gastrohepatic ligament adenopathy is again identified, including at 1.3 cm on image 53/series 2. Other:  No ascites.  No abdominal peritoneal or omental metastasis. Musculoskeletal: No acute osseous abnormality. IMPRESSION: 1. Soft tissue fullness in the distal esophagus, likely representing the primary site of malignancy. This is at least partially obstructive, with contrast from today's esophagram identified within a dilated more superior esophagus. 2. Isolated gastrohepatic ligament adenopathy, highly suspicious for metastatic disease. 3. No evidence of esophageal perforation. 4. Nonspecific lingular nodule, similar. 5. Coronary artery atherosclerosis. Aortic Atherosclerosis (ICD10-I70.0). Electronically Signed   By: Abigail Miyamoto M.D.   On: 12/26/2016 14:58   Ct Chest W Contrast Ct Abdomen  Pelvis W Contrast  Result Date: 12/11/2016 CLINICAL DATA:  Patient with progressive difficulty swallowing. EXAM: CT CHEST, ABDOMEN, AND PELVIS WITH CONTRAST TECHNIQUE: Multidetector CT imaging of the chest, abdomen and pelvis was performed following the standard protocol during bolus administration of intravenous contrast. CONTRAST:  173mL ISOVUE-300 IOPAMIDOL (ISOVUE-300) INJECTION 61% COMPARISON:  CT abdomen pelvis 05/11/2006 FINDINGS: CT CHEST FINDINGS Cardiovascular: Normal heart size. Coronary arterial vascular calcifications. Thoracic aortic vascular calcifications. Right upper extremity PICC line tip terminates in the superior vena cava. Mediastinum/Nodes: No enlarged axillary, mediastinal or hilar lymphadenopathy. There is wall thickening and narrowing of the distal esophagus (image 50; series 2). Lungs/Pleura: There is a 7 mm subpleural nodule in  the lingula (image 96; series 4). Calcified granuloma within the medial right upper lobe (image 63; series 4). Dependent atelectasis/ scarring within the lower lobes bilaterally. No pleural effusion or pneumothorax. Musculoskeletal: Thoracic spine degenerative changes. No aggressive or acute appearing osseous lesions. CT ABDOMEN PELVIS FINDINGS Hepatobiliary: The liver is normal in size and contour. Subcentimeter too small to characterize low-attenuation lesion left hepatic lobe (image 50 ; series 2). Fatty deposition adjacent to the falciform ligament. Status post cholecystectomy. No intrahepatic or extrahepatic biliary ductal dilatation. Pancreas: Mildly atrophic. Spleen: Calcified granulomas in the spleen. Adrenals/Urinary Tract: The adrenal glands are normal. Kidneys enhance symmetrically with contrast. No hydronephrosis. Urinary bladder is unremarkable. Stomach/Bowel: Colon is decompressed, limiting evaluation. Suggestion of mild colonic wall thickening. Normal morphology of the stomach. There is a 1.3 cm soft tissue nodule adjacent to the GE junction (image 53; series 2). Vascular/Lymphatic: Normal caliber abdominal aorta. Peripheral calcified atherosclerotic plaque. No retroperitoneal lymphadenopathy. Reproductive: Prostate unremarkable. Other: Bilateral fat containing inguinal hernias. Musculoskeletal: Lumbar spine degenerative changes. No aggressive or acute appearing osseous lesions. IMPRESSION: There is a 1.3 cm soft tissue nodule adjacent to the GE junction. This is favored to represent an enlarged lymph node, potentially reactive or metastatic in etiology. The colon is decompressed however there is suggestion of wall thickening, raising the possibility of colitis. There is a 7 mm nodule within the left upper lobe. Non-contrast chest CT at 6-12 months is recommended. If the nodule is stable at time of repeat CT, then future CT at 18-24 months (from today's scan) is considered optional for low-risk  patients, but is recommended for high-risk patients. This recommendation follows the consensus statement: Guidelines for Management of Incidental Pulmonary Nodules Detected on CT Images: From the Fleischner Society 2017; Radiology 2017; 284:228-243. Narrowing and wall thickening of the distal esophagus, compatible with known distal esophageal stricture. Electronically Signed   By: Lovey Newcomer M.D.   On: 12/11/2016 20:02   Dg Esophagus  Result Date: 12/06/2016 CLINICAL DATA:  Difficulty swallowing, constant vomiting EXAM: ESOPHOGRAM/BARIUM SWALLOW TECHNIQUE: Single contrast examination was performed using  thick barium. FLUOROSCOPY TIME:  Fluoroscopy Time:  0.5 minute Radiation Exposure Index (if provided by the fluoroscopic device): 5.7 mGy Number of Acquired Spot Images: 0 COMPARISON:  None. FINDINGS: There was normal pharyngeal anatomy and motility. Patient is extremely nauseous. Mild esophageal dilatation with a high-grade stricture of the distal esophagus with irregular margins. IMPRESSION: 1. 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 Recommend upper endoscopy. Electronically Signed   By: Kathreen Devoid   On: 12/06/2016 11:53   Dg Esophagus W/water Sol Cm  Result Date: 12/26/2016 CLINICAL DATA:  Recent esophageal instrumentation. Pain. Evaluate for esophageal rupture. EXAM: ESOPHOGRAM/BARIUM SWALLOW TECHNIQUE: Single contrast  examination was performed using water-soluble contrast. FLUOROSCOPY TIME:  1 minutes and 6 seconds COMPARISON:  None. FINDINGS: The patient ingested water-soluble contrast and imaging was obtained. There is an obstruction of the distal esophagus, just above the gastroesophageal junction. A fluid level exists in the esophagus. Contrast never traversed beyond the distal esophagus. The patient vomited. There is no evidence of extravasation to suggest leak or rupture.  IMPRESSION: No evidence of contrast extravasation to suggest esophageal injury or perforation There is abrupt obstruction of the distal esophagus just above the gastroesophageal junction. Electronically Signed   By: Marybelle Killings M.D.   On: 12/26/2016 14:46     I have independently reviewed the above radiology studies  and reviewed the findings with the patient.   Recent Lab Findings: Lab Results  Component Value Date   WBC 7.1 06/14/2017   HGB 14.9 06/14/2017   HCT 45.1 06/14/2017   PLT 209 06/14/2017   GLUCOSE 89 06/14/2017   TRIG 92 12/11/2016   ALT 25 06/14/2017   AST 16 06/14/2017   NA 137 06/14/2017   K 4.3 06/14/2017   CL 107 06/14/2017   CREATININE 0.82 06/14/2017   BUN 21 (H) 06/14/2017   CO2 19 (L) 06/14/2017   INR 1.00 06/14/2017   Path 12/26/2016  Collected: 12/26/16 1152  Resulting lab: [id number]  Value: Surgical Pathology  CASE: [id number]  PATIENT: Eliseo Gum  Surgical Pathology Report      SPECIMEN SUBMITTED:  A. 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 ESOPHAGUS STRICTURE; COLD BIOPSY:  - SQUAMOUS CELL CARCINOMA.   Comment:  These findings were communicated to Dr. Vicente Males on 12/27/2016.   GROSS DESCRIPTION:   A. Labeled: esophageal stricture C BX   Tissue fragment(s): 3   Size: 0.1-0.4 cm   Description: pink fragments   Entirely submitted in 1 cassette(s).     Final Diagnosis performed by Quay Burow, MD. Electronically signed  12/27/2016 2:05:42PM     The electronic signature indicates that the named Attending Pathologist  has evaluated the specimen   Technical component performed at [facility], [address] [place],  [place] [address]  Lab: [phone] Dir: Darrick Penna. Evette Doffing, MD   Professional component performed at [facility], [facility],  [place], [place], [place] [address]  Lab: [phone] Dir: Dellia Nims. Rubinas, MD    ENDO:12/26/2016 One severe stenosis was found 38 to 40 cm from the incisors. This measured 6 mm (inner diameter) x 2 cm (in length) and was traversed after downsizing scope and dilating. A guide wire was placed, then the scope was withdrawn. Using the wire as a guide, dilation with an 10-19-08 mm pyloric balloon dilator was performed under fluoroscopic guidance. The dilation site was examined following endoscope reinsertion and showed mild improvement in luminal narrowing. We were unable to pass the adult scope , we subsequently further dilated the stricture with a CRE dilator serially from 10-12 mm and subsequently the adult scope was able to pass through. The stricture still appeared to be very tight and the scope was not able to easily glide past. Biopsies were taken with a cold forceps for histology.     Assessment / Plan:   Patient continues to gain weight and improve since last seen.  He still has difficulty swallowing and uses his feeding jejunostomy tube.  We discussed the  pros and cons of proceeding with esophageal resection.  The patient will obtain a follow-up CT scan can of the chest and abdomen and if there is no evidence of distant metastatic disease we will plan to proceed with esophagectomy Monday, April 8.   I had a detailed discussion with [name] regarding the magnitude of the surgical esophagectomy procedure as well as the risks, the expected benefits, and alternatives.  I quoted [name] 5% perioperative mortality rate and a complication rate as high as 40%.  We specifically discussed complications, which include, but were not limited to: recurrent nerve injury with possible permanent hoarseness, anastomotic leak, airway and great vessel injury, conduit ischemia, thoracic duct leak, the inability to complete the operation via a transhiatal approach requiring a right thoracotomy,   bleeding, need for blood transfusion and the potential need for ventilator support.  [name] has had questions answered is well informed and willing to proceed.   Grace Isaac MD      [address] [place] [address] Office [phone]   Beeper [phone]  06/18/2017 7:22 AM

## 2017-06-19 ENCOUNTER — Inpatient Hospital Stay (HOSPITAL_COMMUNITY): Payer: Medicare Other

## 2017-06-19 ENCOUNTER — Encounter (HOSPITAL_COMMUNITY): Payer: Self-pay | Admitting: Cardiothoracic Surgery

## 2017-06-19 LAB — CBC
HEMATOCRIT: 38.7 % — AB (ref 39.0–52.0)
HEMOGLOBIN: 12.6 g/dL — AB (ref 13.0–17.0)
MCH: 27.7 pg (ref 26.0–34.0)
MCHC: 32.6 g/dL (ref 30.0–36.0)
MCV: 85.1 fL (ref 78.0–100.0)
Platelets: 189 10*3/uL (ref 150–400)
RBC: 4.55 MIL/uL (ref 4.22–5.81)
RDW: 14.8 % (ref 11.5–15.5)
WBC: 14.2 10*3/uL — AB (ref 4.0–10.5)

## 2017-06-19 LAB — BASIC METABOLIC PANEL
Anion gap: 9 (ref 5–15)
BUN: 16 mg/dL (ref 6–20)
CO2: 21 mmol/L — ABNORMAL LOW (ref 22–32)
Calcium: 8.5 mg/dL — ABNORMAL LOW (ref 8.9–10.3)
Chloride: 107 mmol/L (ref 101–111)
Creatinine, Ser: 0.94 mg/dL (ref 0.61–1.24)
Glucose, Bld: 135 mg/dL — ABNORMAL HIGH (ref 65–99)
Potassium: 4 mmol/L (ref 3.5–5.1)
SODIUM: 137 mmol/L (ref 135–145)

## 2017-06-19 LAB — POCT I-STAT 3, ART BLOOD GAS (G3+)
Acid-base deficit: 2 mmol/L (ref 0.0–2.0)
Bicarbonate: 23.2 mmol/L (ref 20.0–28.0)
O2 Saturation: 97 %
TCO2: 24 mmol/L (ref 22–32)
pCO2 arterial: 39.1 mmHg (ref 32.0–48.0)
pH, Arterial: 7.381 (ref 7.350–7.450)
pO2, Arterial: 96 mmHg (ref 83.0–108.0)

## 2017-06-19 LAB — BLOOD GAS, ARTERIAL
Acid-base deficit: 1.1 mmol/L (ref 0.0–2.0)
BICARBONATE: 22.5 mmol/L (ref 20.0–28.0)
DRAWN BY: 51702
O2 Content: 4 L/min
O2 Saturation: 94.5 %
PATIENT TEMPERATURE: 98.6
PH ART: 7.443 (ref 7.350–7.450)
pCO2 arterial: 33.4 mmHg (ref 32.0–48.0)
pO2, Arterial: 71.2 mmHg — ABNORMAL LOW (ref 83.0–108.0)

## 2017-06-19 LAB — GLUCOSE, CAPILLARY
Glucose-Capillary: 111 mg/dL — ABNORMAL HIGH (ref 65–99)
Glucose-Capillary: 112 mg/dL — ABNORMAL HIGH (ref 65–99)
Glucose-Capillary: 124 mg/dL — ABNORMAL HIGH (ref 65–99)
Glucose-Capillary: 126 mg/dL — ABNORMAL HIGH (ref 65–99)
Glucose-Capillary: 131 mg/dL — ABNORMAL HIGH (ref 65–99)
Glucose-Capillary: 138 mg/dL — ABNORMAL HIGH (ref 65–99)

## 2017-06-19 IMAGING — DX DG CHEST 1V PORT
1 series · 1 of 1 positions shown · non-contrast
Comparison: [DATE].  CT [BD].

CLINICAL DATA: Status post esophageal check to me.  Chest tube.

EXAM:
PORTABLE CHEST 1 VIEW

[chest ap]
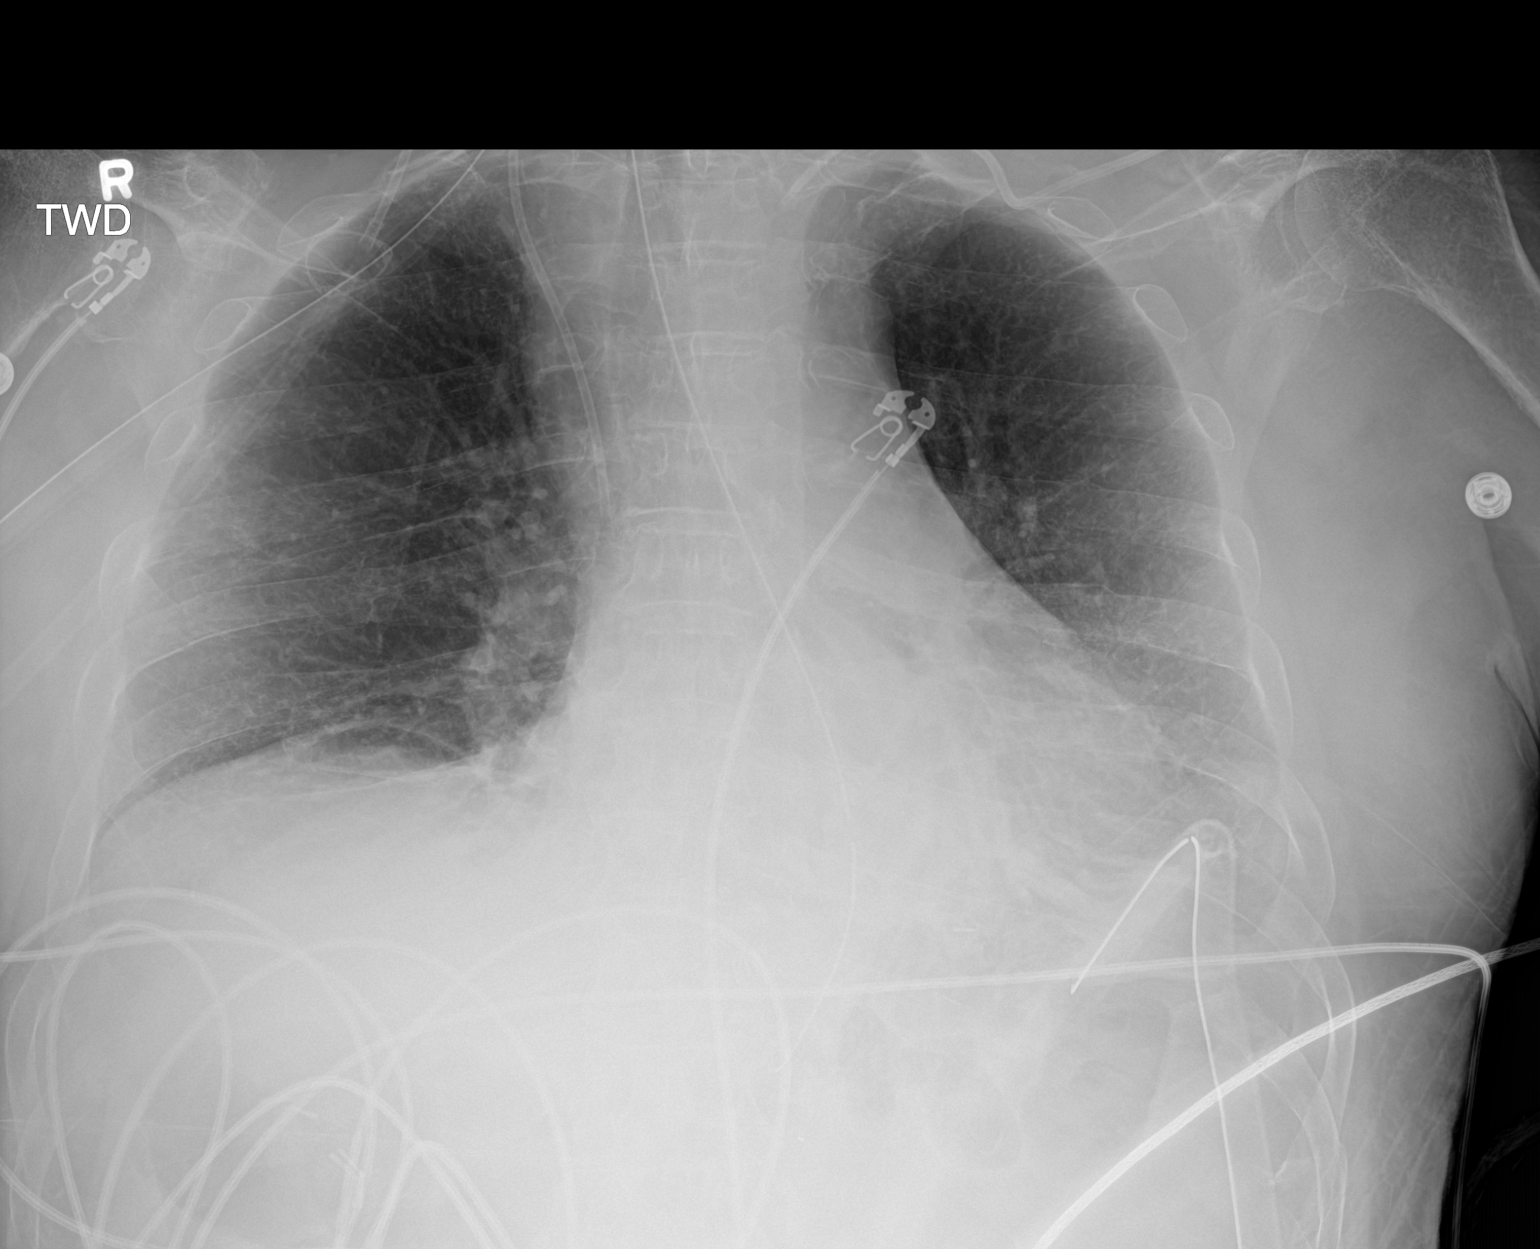

[1 of 1 positions shown; findings below may reference images not displayed]

FINDINGS: Interim extubation. NG tube tip noted at the level of the
hemidiaphragm unchanged in position. Left chest tube unchanged in
position. Left subclavian line and right chest port in unchanged
position. Heart size stable. Mild bibasilar atelectasis. Tiny left
pleural effusion. No pneumothorax.
IMPRESSION: 1. Interim extubation. Remaining lines and tubes including left
chest tube in stable position. No pneumothorax. NG tube tip noted at
the level of the hemidiaphragm in unchanged position.

2. Low lung volumes with mild bibasilar atelectasis. Small left
pleural effusion.

## 2017-06-19 MED ORDER — SODIUM CHLORIDE 0.9% FLUSH: 10.0000 mL | INTRAVENOUS | Status: DC | PRN

## 2017-06-19 MED ORDER — CHLORHEXIDINE GLUCONATE CLOTH 2 % EX PADS
6.0000 | MEDICATED_PAD | Freq: Every day | CUTANEOUS | Status: DC
  Administered 2017-06-19 – 2017-06-20 (×2): 6 via TOPICAL

## 2017-06-19 NOTE — Progress Notes (Signed)
Initial Nutrition Assessment  INTERVENTION:   Recommend initiating enteral nutrition via J-tube Vital 1.5 @ 60 ml/hr (1440 ml/day)  Provides: 2160 kcal, 97 grams protein, and 1100 ml free water.   Monitor for diet advancement  NUTRITION DIAGNOSIS:   Increased nutrient needs related to cancer and cancer related treatments as evidenced by estimated needs.  GOAL:   Patient will meet greater than or equal to 90% of their needs  MONITOR:   Diet advancement, TF tolerance  REASON FOR ASSESSMENT:   (Referral from outpatient cancer center RD)    ASSESSMENT:   Pt with PMH of esophageal cancer dx summer 2018. Pt started on TPN 9/18 as inpatient at [facility] as pt met criteria for severe malnutrition. Pt had J-tube placed and started on TF 01/2017. Pt followed by RD at cancer center.    4/8 complete esophagectomy NG for suction -  No output CT: 410 ml x 24 hr  Spoke with pt, daughter and wife. They confirm report by outpatient RD. Pt had regained weight and was tolerating his TF at home.  Per MD notes will hopefull restart TF soon and gastrografin swallow next Monday.   Medications reviewed and include: SSI, reglan 10 mg every 6 hrs D5 1/2 NS @ 100 ml/hr Labs reviewed  Home TF regimen:  vital 1.5, 4.5 cartons per day via continuous pump.  Continue water flush of 168m 4 times per  day with increase in oral water intake of 2, 8 oz cups per day.  TF ran over 16 hrs.   Continue to drink 2 ensure plus per day for additional calories and free water.       Per RD pt was not tolerating Osmolite TF due to diarrhea and was switched to Vital TF. Pt also did not tolerate Prostat PTA.  Pt had lost weight from his usual 180 lb down to 171 lb but has regained all of his weight with adequate nutrition support.    NUTRITION - FOCUSED PHYSICAL EXAM:    Most Recent Value  Orbital Region  No depletion  Upper Arm Region  No depletion  Thoracic and Lumbar Region  No depletion  Buccal Region  No  depletion  Temple Region  No depletion  Clavicle Bone Region  No depletion  Clavicle and Acromion Bone Region  No depletion  Scapular Bone Region  No depletion  Dorsal Hand  No depletion  Patellar Region  No depletion  Anterior Thigh Region  No depletion  Edema (RD Assessment)  None  Hair  Reviewed  Eyes  Reviewed  Mouth  Reviewed  Skin  Reviewed  Nails  Reviewed       Diet Order:  Diet NPO time specified  EDUCATION NEEDS:   No education needs have been identified at this time  Skin:  Skin Assessment: Reviewed RN Assessment(incisions)  Last BM:  unknown  Height:   Ht Readings from Last 1 Encounters:  06/14/17 '5\' 9"'  (1.753 m)    Weight:   Wt Readings from Last 1 Encounters:  06/18/17 180 lb (81.6 kg)    Ideal Body Weight:  72.7 kg  BMI:  Body mass index is 26.58 kg/m.  Estimated Nutritional Needs:   Kcal:  2000-2300  Protein:  97-112 grams  Fluid:  > 2L/day  HClimax LDN, CPyotePager [phone] Hours Pager

## 2017-06-19 NOTE — Progress Notes (Addendum)
TCTS DAILY ICU PROGRESS NOTE                   [address]            ,[place] [address]          [phone]   1 Day Post-Op Procedure(s) (LRB): cervical ESOPHAGECTOMY COMPLETE (N/A) VIDEO BRONCHOSCOPY (N/A) PYLOROMYOTOMY (N/A)  Total Length of Stay:  LOS: 1 day   Subjective:  Doing okay.  He states he was more comfortable in bed and would like to go back to bed.  He had some nausea which is better.  Denies vomiting.  Complaints of pain worse around his neck incision and his central line.    Objective: Vital signs in last 24 hours: Temp:  [97.2 F (36.2 C)-98 F (36.7 C)] 98 F (36.7 C) (04/09 0340) Pulse Rate:  [62-104] 84 (04/09 0700) Cardiac Rhythm: Normal sinus rhythm (04/09 0400) Resp:  [13-27] 14 (04/09 0700) BP: (97-136)/(57-93) 112/57 (04/09 0700) SpO2:  [95 %-99 %] 99 % (04/09 0700) Arterial Line BP: (59-165)/(41-82) 123/41 (04/09 0700) FiO2 (%):  [40 %-50 %] 40 % (04/08 1652)  Filed Weights   06/18/17 0543  Weight: 180 lb (81.6 kg)    Weight change:    Intake/Output from previous day: 04/08 0701 - 04/09 0700 In: 5777.6 [I.V.:4527.6; IV Piggyback:1250] Out: 1745 [Urine:1085; Blood:250; Chest Tube:410]  Current Meds: Scheduled Meds: . fentaNYL   Intravenous Q4H  . insulin aspart  0-24 Units Subcutaneous Q4H  . mouth rinse  15 mL Mouth Rinse BID  . metoCLOPramide (REGLAN) injection  10 mg Intravenous Q6H  . pantoprazole (PROTONIX) IV  40 mg Intravenous Q12H   Continuous Infusions: . cefOXitin    . dexmedetomidine (PRECEDEX) IV infusion Stopped (06/18/17 1650)  . dextrose 5 % and 0.45% NaCl 100 mL/hr at 06/19/17 0639  . potassium chloride     PRN Meds:.albuterol, diphenhydrAMINE **OR** diphenhydrAMINE, naloxone **AND** sodium chloride flush, ondansetron (ZOFRAN) IV, potassium chloride  General appearance: alert, cooperative and no distress Heart: regular rate and rhythm Lungs: clear to auscultation bilaterally Abdomen: mild  distention, no BS appreciated Extremities: extremities normal, atraumatic, no cyanosis or edema Wound: neck incision is clean, dressing is saturated with clear liquid, aquacel in place on abdomen  Lab Results: CBC: Recent Labs    06/18/17 1452 06/19/17 0410  WBC 16.0* 14.2*  HGB 12.6* 12.6*  HCT 39.5 38.7*  PLT 208 189   BMET:  Recent Labs    06/18/17 1452 06/19/17 0410  NA 139 137  K 4.1 4.0  CL 105 107  CO2 20* 21*  GLUCOSE 192* 135*  BUN 16 16  CREATININE 1.21 0.94  CALCIUM 8.3* 8.5*    CMET: Lab Results  Component Value Date   WBC 14.2 (H) 06/19/2017   HGB 12.6 (L) 06/19/2017   HCT 38.7 (L) 06/19/2017   PLT 189 06/19/2017   GLUCOSE 135 (H) 06/19/2017   TRIG 92 12/11/2016   ALT 25 06/14/2017   AST 16 06/14/2017   NA 137 06/19/2017   K 4.0 06/19/2017   CL 107 06/19/2017   CREATININE 0.94 06/19/2017   BUN 16 06/19/2017   CO2 21 (L) 06/19/2017   INR 1.00 06/14/2017      PT/INR: No results for input(s): LABPROT, INR in the last 72 hours. Radiology: Dg Chest Port 1 View  Result Date: 06/18/2017 CLINICAL DATA:  Status post esophagectomy EXAM: PORTABLE CHEST 1 VIEW COMPARISON:  06/18/17 FINDINGS: Right chest  wall port is again seen. Endotracheal tube and nasogastric catheter are noted. The nasogastric catheter tip is at the level of the diaphragm. Bibasilar atelectatic changes are noted. Left thoracostomy tube is seen without evidence of pneumothorax. Minimal pleural effusions are noted bilaterally. Left central venous line is noted in the left innominate vein. No pneumothorax is seen. IMPRESSION: Postsurgical changes consistent with the given clinical history. Electronically Signed   By: Inez Catalina M.D.   On: 06/18/2017 15:48     Assessment/Plan: S/P Procedure(s) (LRB): cervical ESOPHAGECTOMY COMPLETE (N/A) VIDEO BRONCHOSCOPY (N/A) PYLOROMYOTOMY (N/A)  1. S/P Esophagectomy- continue NG tube to intermittent suction, CT output is relatively low will leave in  place for now.. Patient will remain NPO, plan for Gastrograffin swallow next Monday 2. CV- NSR with PVCs, BP controlled 3. GI- stomach distended, no BS yet.. Continue reglan, can hopefully start tube feedings in the next 24-48 hours, continue J tube care 4. Continue IV Fluids 5. Pain control- patient states pain medication isn't working very well, may benefit from a few doses of Toradol, will defer to Dr. Servando Snare, creatinine is WNL 6. dispo- patient stable, continue current care   Ellwood Handler 06/19/2017 7:32 AM   Will avoid toradol as some evidence may compromise anaostomatic healing  Patient appears comfortable D/c aline , continue foley another day to monitor uop- cr stable   I have seen and examined [name] and agree with the above assessment  and plan.  Grace Isaac MD Beeper [phone] Office [phone] 06/19/2017 8:20 AM

## 2017-06-19 NOTE — Progress Notes (Signed)
TCTS BRIEF SICU PROGRESS NOTE  1 Day Post-Op  S/P Procedure(s) (LRB): cervical ESOPHAGECTOMY COMPLETE (N/A) VIDEO BRONCHOSCOPY (N/A) PYLOROMYOTOMY (N/A)   Stable day Feels sore Breathing comfortably NSR - sinus tach w/ stable BP UOP adequate  Plan: Continue current plan.  Discussed at length with patient's daughter at bedside  Rexene Alberts, MD 06/19/2017 6:42 PM

## 2017-06-19 NOTE — Care Management Note (Signed)
Case Management Note Marvetta Gibbons RN, BSN Unit 4E-Case Manager-- [place] coverage [phone]  Patient Details  Name: Casey Acevedo MRN: [id number] Date of Birth: [birth year]  Subjective/Objective:   Pt admitted s/p cervical ESOPHAGECTOMY COMPLETE VIDEO BRONCHOSCOPY, and PYLOROMYOTOMY                 Action/Plan: PTA pt lived at home with wife- CM to follow for potential transition of care needs as pt progresses post op.   Expected Discharge Date:                  Expected Discharge Plan:  [facility]  In-House Referral:     Discharge planning Services  CM Consult  Post Acute Care Choice:    Choice offered to:     DME Arranged:    DME Agency:     HH Arranged:    [place] Agency:     Status of Service:  In process, will continue to follow  If discussed at Long Length of Stay Meetings, dates discussed:    Discharge Disposition:   Additional Comments:  Dawayne Patricia, RN 06/19/2017, 10:45 AM

## 2017-06-19 NOTE — Op Note (Signed)
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  SURGICAL PROCEDURES:  Video bronchoscopy, transhiatal total esophagectomy with cervical esophagogastrostomy, pyloromyotomy.  SURGEON:  Lanelle Bal, MD.  FIRST ASSISTANT:  Ellwood Handler, PA.  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  At the time he was diagnosed, he was unable to take any oral nutrition.  A feeding jejunostomy tube was surgically placed and the patient underwent a course of chemotherapy and radiation therapy, completing this in late December 2018.  Because of the patient's otherwise weakness in January and February, surgery was delayed until he was in a better anabolic state, he continued to have trouble swallowing. Followup PET scan done in February showed significant response to his previous treatment.  Followup CT scan done the week prior to surgery showed no evidence of metastatic disease.  With the patient in much better condition, esophagectomy was discussed and recommended to the patient.  Risks and options were discussed in detail.  He agreed and signed informed consent.  DESCRIPTION OF PROCEDURE:  With central line in the left subclavian vein and arterial line in place, the patient underwent general endotracheal anesthesia with a NIMS tube.  Appropriate time-out was performed and a fiberoptic bronchoscopy was performed through the endotracheal tube to the  subsegmental level of both the right and left tracheobronchial tree without evidence of tracheal or bronchial obstructions.  The scope was removed.  The left neck, chest, and abdomen were prepped with Betadine and draped in usual sterile manner.  The appropriate time-out was performed and then we proceeded first with upper midline abdominal incision, taking care not to disrupt the patient's previously placed surgical feeding jejunostomy tube.  The abdomen was explored.  There were no obvious areas of metastatic deposits.  One small area in the omentum was biopsied and it was negative for tumor.  We then freed the left lobe of the liver retracting it laterally to the right, divided the short gastric vessels with the Lig X cautery device with the greater curve of stomach freed.  The esophageal hiatus was then opened. Esophagus was encircled with a Penrose drain and with the Harmonic scalpel, the begin of transhiatal resection of the esophagus was started.  With the distal third of the esophagus freed, we then entered the lesser sac, easily identified the coronary vein and the left gastric artery.  These were encircled with vessel loop and a bulldog was placed on the left gastric artery.  With the artery divided, there was easily palpable pulse in the right gastroepiploic vessel.  The duodenum was kocherized.  We then continued with transhiatal partially and partially under direct vision with the Harmonic scalpel dividing esophageal attachments.  We then moved to  the left neck where an incision was made along with sternocleidomastoid muscle and carried down through the platysma.  Muscle, jugular vein, and carotid artery were retracted laterally and dissection was carried down posterior to the pretracheal fascia.  Using no metal retractors in the neck, we gently retracted the thyroid medially using the NIMS stimulator, confirmed the identity of the and location of the recurrent nerve.  The  esophagus was encircled. Through the neck incision, a Penrose was placed.  With this identified, we then proceeded with completing the transhiatal esophagectomy, freeing the upper portion of the esophagus.  With esophagus freed, the NG tube was retracted into the cervical esophagus.  The esophagus was divided with a GIA stapler and delivered to the abdomen.  We then divided with a vascular stapler the coronary vein and left gastric artery.  We then turned our attention to the pylorus where a pyloromyotomy was performed and closed with interrupted 3-0 silk sutures.  Using a Foley catheter and a laparoscopic camera bag, we prepared to transfer the gastric tube into the chest.  With the vessels divided and then the pyloromyotomy performed, we then stapled along the lesser curve of the stomach with serial firing of a 55-mm GIA stapler.  This staple line was then oversewn with 4-0 Prolene suture.  Specimen was sent to Pathology for frozen section.  The gastric tube that was created was then placed in the telescope bag and secured and passed up through the posterior mediastinum to the cervical incision.  The frozen section was reported as negative both proximal and distal margins.  We then tacked the cervical esophagus to the anterior surface of the gastric tube away from the very tip of the gastric tube with interrupted 2-0 silk sutures.  The staple end of the esophagus was opened.  A small opening with electrocautery unit was made in the stomach.  The mucosal edges were reapproximated with interrupted Vicryl sutures.  A 30-mm Endo-GIA stapler was then used to create the posterior wall of the anastomosis. The NG tube was then passed through the anastomosis into the upper abdomen.  The anterior wall of the anastomosis was then closed with interrupted Vicryl 4-0 sutures and oversewn with interrupted 3-0 silk sutures.  A Penrose drain was placed in the depths of the incision in the vicinity of  the anastomosis and brought out through a separate stab wound in the left neck.  Wound was then irrigated.  At the completion of this stage, the NIMS device was used again to check for continuity of the recurrent nerve, which was intact.  The incisions were then closed in the deeper muscle layers with interrupted 3-0 Vicryl sutures, 4-0 subcuticular stitch in skin edges.  Dermabond was applied.  A left chest tube was placed.  We then closed the esophageal hiatus, tacking it to the stomach to prevent herniation into the chest.  The feeding jejunostomy which had previously been placed was intact.  The fascia was then closed with running 0 Prolene suture.  Subcutaneous tissue was closed with a running 2-0 Vicryl and a 3-0 subcuticular stitch was applied.  Dressings were applied.  Sponge and needle count was reported as correct at the completion of procedure.  The patient tolerated the procedure without obvious complication.  Estimated blood loss was 250 mL.  The patient was left intubated and transferred to the Surgical Intensive Care Unit for further postop care.     Lanelle Bal, MD     EG/MEDQ  D:  06/18/2017  T:  06/19/2017  Job:  [id number]

## 2017-06-20 ENCOUNTER — Inpatient Hospital Stay (HOSPITAL_COMMUNITY): Payer: Medicare Other

## 2017-06-20 LAB — GLUCOSE, CAPILLARY
Glucose-Capillary: 118 mg/dL — ABNORMAL HIGH (ref 65–99)
Glucose-Capillary: 127 mg/dL — ABNORMAL HIGH (ref 65–99)
Glucose-Capillary: 112 mg/dL — ABNORMAL HIGH (ref 65–99)
Glucose-Capillary: 127 mg/dL — ABNORMAL HIGH (ref 65–99)

## 2017-06-20 LAB — BASIC METABOLIC PANEL
Anion gap: 9 (ref 5–15)
BUN: 14 mg/dL (ref 6–20)
CO2: 24 mmol/L (ref 22–32)
Calcium: 8.1 mg/dL — ABNORMAL LOW (ref 8.9–10.3)
Chloride: 104 mmol/L (ref 101–111)
Creatinine, Ser: 0.99 mg/dL (ref 0.61–1.24)
GFR calc Af Amer: >60 mL/min (ref >60)
GFR calc non Af Amer: >60 mL/min (ref >60)
Glucose, Bld: 164 mg/dL — ABNORMAL HIGH (ref 65–99)
Potassium: 3.9 mmol/L (ref 3.5–5.1)
Sodium: 137 mmol/L (ref 135–145)

## 2017-06-20 LAB — CBC
HCT: 37.3 % — ABNORMAL LOW (ref 39.0–52.0)
Hemoglobin: 12.3 g/dL — ABNORMAL LOW (ref 13.0–17.0)
MCH: 28.7 pg (ref 26.0–34.0)
MCHC: 33 g/dL (ref 30.0–36.0)
MCV: 86.9 fL (ref 78.0–100.0)
Platelets: 159 10*3/uL (ref 150–400)
RBC: 4.29 MIL/uL (ref 4.22–5.81)
RDW: 15.7 % — ABNORMAL HIGH (ref 11.5–15.5)
WBC: 13.6 10*3/uL — ABNORMAL HIGH (ref 4.0–10.5)

## 2017-06-20 IMAGING — DX DG CHEST 1V PORT
1 series · 1 of 1 positions shown · non-contrast
Comparison: [DATE]

CLINICAL DATA: Status post esophagectomy

EXAM:
PORTABLE CHEST 1 VIEW

[chest ap]
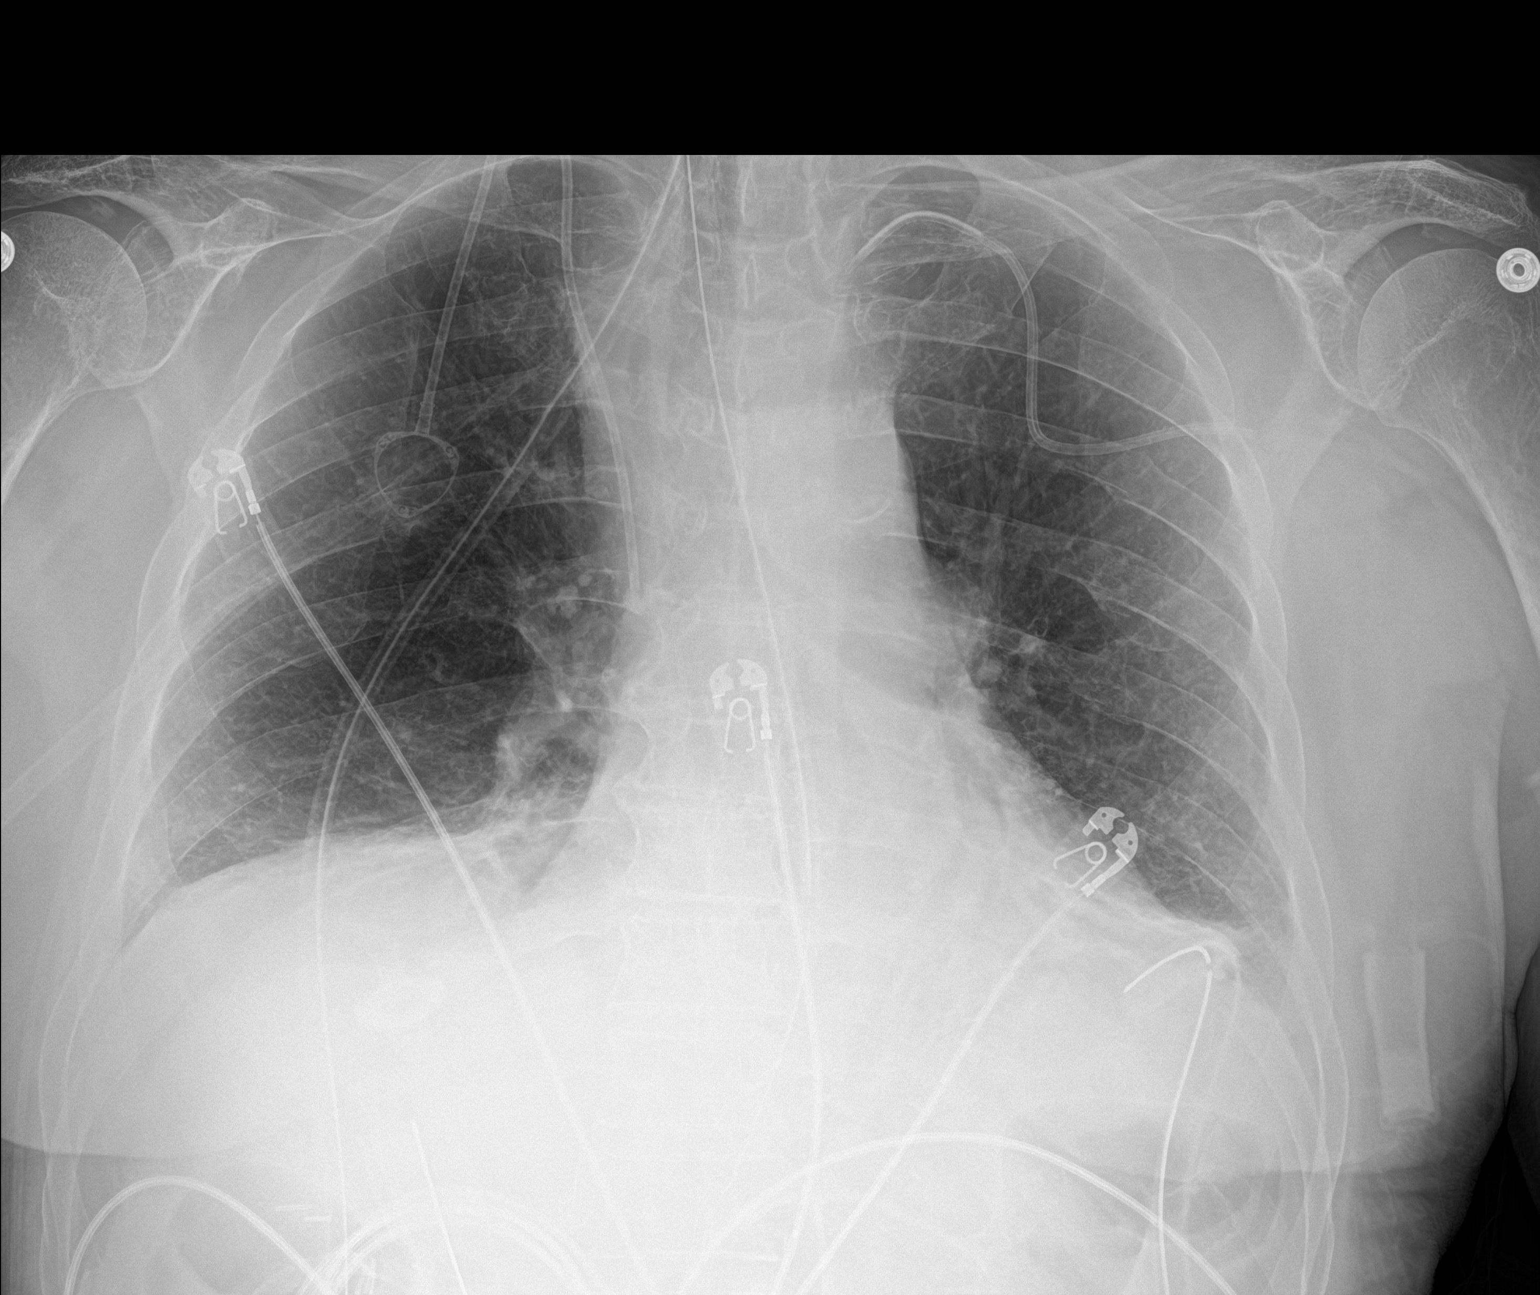

[1 of 1 positions shown; findings below may reference images not displayed]

FINDINGS: Nasogastric catheter is again seen and stable. Left-sided central
venous line and right-sided chest wall port are again noted and
stable. Left chest tube is noted inferiorly. Mild bibasilar
atelectasis is noted. No pneumothorax is seen.
IMPRESSION: Mild bibasilar atelectasis.

No pneumothorax noted.

## 2017-06-20 MED ORDER — VITAL 1.5 CAL PO LIQD
1000.0000 mL | ORAL | Status: DC
  Administered 2017-06-20: 1000 mL
  Filled 2017-06-20: qty 1000

## 2017-06-20 MED ORDER — VITAL HIGH PROTEIN PO LIQD: 1000.0000 mL | ORAL | Status: DC

## 2017-06-20 NOTE — Progress Notes (Addendum)
TCTS DAILY ICU PROGRESS NOTE                   [address]            [place] [address]          [phone]   2 Days Post-Op Procedure(s) (LRB): cervical ESOPHAGECTOMY COMPLETE (N/A) VIDEO BRONCHOSCOPY (N/A) PYLOROMYOTOMY (N/A)  Total Length of Stay:  LOS: 2 days   Subjective:  No new complaints.  Up in chair and wants to get back into bed.  He denies nausea/vomiting.  Objective: Vital signs in last 24 hours: Temp:  [98 F (36.7 C)-98.9 F (37.2 C)] 98.8 F (37.1 C) (04/10 0400) Pulse Rate:  [84-107] 97 (04/10 0000) Cardiac Rhythm: Normal sinus rhythm;Sinus tachycardia (04/10 0000) Resp:  [12-22] 19 (04/10 0439) BP: (107-126)/(57-90) 107/63 (04/10 0000) SpO2:  [94 %-98 %] 95 % (04/10 0439) Arterial Line BP: (54-150)/(46-53) 142/46 (04/09 1200) FiO2 (%):  [95 %] 95 % (04/10 0439)  Filed Weights   06/18/17 0543  Weight: 180 lb (81.6 kg)    Weight change:    Intake/Output from previous day: 04/09 0701 - 04/10 0700 In: 2285 [I.V.:2225; NG/GT:60] Out: 1860 [Urine:765; Emesis/NG output:145; Drains:610; Chest Tube:340]  Current Meds: Scheduled Meds: . Chlorhexidine Gluconate Cloth  6 each Topical Daily  . fentaNYL   Intravenous Q4H  . insulin aspart  0-24 Units Subcutaneous Q4H  . mouth rinse  15 mL Mouth Rinse BID  . metoCLOPramide (REGLAN) injection  10 mg Intravenous Q6H  . pantoprazole (PROTONIX) IV  40 mg Intravenous Q12H   Continuous Infusions: . dexmedetomidine (PRECEDEX) IV infusion Stopped (06/18/17 1650)  . dextrose 5 % and 0.45% NaCl 75 mL/hr at 06/20/17 0606  . potassium chloride     PRN Meds:.albuterol, diphenhydrAMINE **OR** diphenhydrAMINE, naloxone **AND** sodium chloride flush, ondansetron (ZOFRAN) IV, potassium chloride, sodium chloride flush  General appearance: alert, cooperative and no distress Heart: regular rate and rhythm and tachy Lungs: clear to auscultation bilaterally Abdomen: soft, non-tender; bowel sounds  normal; no masses,  no organomegaly Extremities: edema trace Wound: clean, neck with serous drainage present, aquacel remains in place on abdomen  Lab Results: CBC: Recent Labs    06/19/17 0410 06/20/17 0520  WBC 14.2* 13.6*  HGB 12.6* 12.3*  HCT 38.7* 37.3*  PLT 189 159   BMET:  Recent Labs    06/19/17 0410 06/20/17 0520  NA 137 137  K 4.0 3.9  CL 107 104  CO2 21* 24  GLUCOSE 135* 164*  BUN 16 14  CREATININE 0.94 0.99  CALCIUM 8.5* 8.1*    CMET: Lab Results  Component Value Date   WBC 13.6 (H) 06/20/2017   HGB 12.3 (L) 06/20/2017   HCT 37.3 (L) 06/20/2017   PLT 159 06/20/2017   GLUCOSE 164 (H) 06/20/2017   TRIG 92 12/11/2016   ALT 25 06/14/2017   AST 16 06/14/2017   NA 137 06/20/2017   K 3.9 06/20/2017   CL 104 06/20/2017   CREATININE 0.99 06/20/2017   BUN 14 06/20/2017   CO2 24 06/20/2017   INR 1.00 06/14/2017      PT/INR: No results for input(s): LABPROT, INR in the last 72 hours. Radiology: Dg Chest Port 1 View  Result Date: 06/19/2017 CLINICAL DATA:  Status post esophageal check to me.  Chest tube. EXAM: PORTABLE CHEST 1 VIEW COMPARISON:  06/18/2017.  CT 2019. FINDINGS: Interim extubation. NG tube tip noted at the level of the hemidiaphragm unchanged  in position. Left chest tube unchanged in position. Left subclavian line and right chest port in unchanged position. Heart size stable. Mild bibasilar atelectasis. Tiny left pleural effusion. No pneumothorax. IMPRESSION: 1. Interim extubation. Remaining lines and tubes including left chest tube in stable position. No pneumothorax. NG tube tip noted at the level of the hemidiaphragm in unchanged position. 2. Low lung volumes with mild bibasilar atelectasis. Small left pleural effusion. Electronically Signed   By: Marcello Moores  Register   On: 06/19/2017 08:00     Assessment/Plan: S/P Procedure(s) (LRB): cervical ESOPHAGECTOMY COMPLETE (N/A) VIDEO BRONCHOSCOPY (N/A) PYLOROMYOTOMY (N/A)  1. S/P Esophagectomy- NG  Tube remains in place on intermittent suction, continues to drain will leave in place, CT output 800 total since surgery, leave in place today 2. CV- Sinus Tach, occasional PVC, BP is WNL- monitor likely due to mild dehydrations, fluids are running at 100 ml per hour, if necessary can start low dose BB 3. GI- stomach distention is mildly improved, no BS yet on exam, continue reglan, will discuss when it is appropriate to start trickle tube feedings with Dr. Servando Snare, continue J Tube Care 4. Renal- creatinine remains stable, good U/O... Will d/c foley catheter today 5. Dispo-patient stable, watch HR, continue NG tube and chest tubes, d/c foley catheter, will discuss with Dr. Servando Snare when it is appropriate to start trickle tube feedings.   Junie Panning Barrett 06/20/2017 7:36 AM   Walked around the unit this am, will start trickle tf via j tube but not increase today  I have seen and examined [name] and agree with the above assessment  and plan.  Grace Isaac MD Beeper [phone] Office [phone] 06/20/2017 8:59 AM

## 2017-06-20 NOTE — Progress Notes (Signed)
Patient ID: Casey Acevedo, male   DOB: [birth year], 77 y.o.   MRN: [id number] EVENING ROUNDS NOTE :     [address]       ,[place] [address]             [phone]                 2 Days Post-Op Procedure(s) (LRB): cervical ESOPHAGECTOMY COMPLETE (N/A) VIDEO BRONCHOSCOPY (N/A) PYLOROMYOTOMY (N/A)  Total Length of Stay:  LOS: 2 days  BP 140/83   Pulse (!) 113   Temp 97.9 F (36.6 C) (Oral)   Resp 20   Wt 180 lb (81.6 kg)   SpO2 97%   BMI 26.58 kg/m   .Intake/Output      04/09 0701 - 04/10 0700 04/10 0701 - 04/11 0700   I.V. (mL/kg) 2225 (27.3) 50 (0.6)   NG/GT 60    IV Piggyback     Total Intake(mL/kg) 2285 (28) 50 (0.6)   Urine (mL/kg/hr) 765 (0.4) 170 (0.2)   Emesis/NG output 145    Drains 610 20   Blood     Chest Tube 340 80   Total Output 1860 270   Net +425 -220          . dexmedetomidine (PRECEDEX) IV infusion Stopped (06/18/17 1650)  . dextrose 5 % and 0.45% NaCl 75 mL/hr (06/20/17 1810)  . feeding supplement (VITAL 1.5 CAL) 1,000 mL (06/20/17 1539)  . potassium chloride       Lab Results  Component Value Date   WBC 13.6 (H) 06/20/2017   HGB 12.3 (L) 06/20/2017   HCT 37.3 (L) 06/20/2017   PLT 159 06/20/2017   GLUCOSE 164 (H) 06/20/2017   TRIG 92 12/11/2016   ALT 25 06/14/2017   AST 16 06/14/2017   NA 137 06/20/2017   K 3.9 06/20/2017   CL 104 06/20/2017   CREATININE 0.99 06/20/2017   BUN 14 06/20/2017   CO2 24 06/20/2017   INR 1.00 06/14/2017   Stable day, ambulated, path still pending Central line pulled back some will d/c now  and use peripheral  Started on low dose tube feeding  Grace Isaac MD  Beeper [phone] Office [phone] 06/20/2017 6:15 PM

## 2017-06-20 NOTE — Progress Notes (Signed)
Nutrition Follow-up  INTERVENTION:   Started trickle TF of Vital 1.5 @ 10 ml/hr  (provides: 360 kcal and 16 grams protein)  Recommend goal:  Vital 1.5 @ 60 ml/hr (1440 ml/day)  Provides: 2160 kcal, 97 grams protein, and 1100 ml free water.   Monitor for diet advancement  NUTRITION DIAGNOSIS:   Increased nutrient needs related to cancer and cancer related treatments as evidenced by estimated needs. Ongoing.   GOAL:   Patient will meet greater than or equal to 90% of their needs Progressing.   MONITOR:   Diet advancement, TF tolerance  REASON FOR ASSESSMENT:   Consult Enteral/tube feeding initiation and management  ASSESSMENT:   Pt with PMH of esophageal cancer dx summer 2018. Pt started on TPN 9/18 as inpatient at [facility] as pt met criteria for severe malnutrition. Pt had J-tube placed and started on TF 01/2017. Pt followed by RD at cancer center.    4/10 start trickle TF via J-tube 4/8 complete esophagectomy NG for suction -  145 ml  Neck drain: 610 ml out CT: 340 ml x 24 hr  Spoke with pt who reports less pain and feels great today. He is planning to walk the unit this afternoon.   Medications reviewed and include: SSI, reglan 10 mg every 6 hrs D5 1/2 NS @ 100 ml/hr Labs reviewed  Home TF regimen:  vital 1.5, 4.5 cartons per day via continuous pump.  Continue water flush of 175m 4 times per  day with increase in oral water intake of 2, 8 oz cups per day.  TF ran over 16 hrs.   Continue to drink 2 ensure plus per day for additional calories and free water.       Per RD pt was not tolerating Osmolite TF due to diarrhea and was switched to Vital TF. Pt also did not tolerate Prostat PTA.  Pt had lost weight from his usual 180 lb down to 171 lb but has regained all of his weight with adequate nutrition support.     Diet Order:  Diet NPO time specified  EDUCATION NEEDS:   No education needs have been identified at this time  Skin:  Skin Assessment: Reviewed RN  Assessment(incisions)  Last BM:  unknown  Height:   Ht Readings from Last 1 Encounters:  06/14/17 5' 9" (1.753 m)    Weight:   Wt Readings from Last 1 Encounters:  06/18/17 180 lb (81.6 kg)    Ideal Body Weight:  72.7 kg  BMI:  Body mass index is 26.58 kg/m.  Estimated Nutritional Needs:   Kcal:  2000-2300  Protein:  97-112 grams  Fluid:  > 2L/day  [place] LDN, CParajePager [phone] Hours Pager

## 2017-06-21 ENCOUNTER — Inpatient Hospital Stay (HOSPITAL_COMMUNITY): Payer: Medicare Other

## 2017-06-21 ENCOUNTER — Other Ambulatory Visit: Payer: Self-pay

## 2017-06-21 LAB — CBC
HCT: 35.5 % — ABNORMAL LOW (ref 39.0–52.0)
Hemoglobin: 11.4 g/dL — ABNORMAL LOW (ref 13.0–17.0)
MCH: 28 pg (ref 26.0–34.0)
MCHC: 32.1 g/dL (ref 30.0–36.0)
MCV: 87.2 fL (ref 78.0–100.0)
Platelets: 145 10*3/uL — ABNORMAL LOW (ref 150–400)
RBC: 4.07 MIL/uL — ABNORMAL LOW (ref 4.22–5.81)
RDW: 15.6 % — ABNORMAL HIGH (ref 11.5–15.5)
WBC: 8.7 10*3/uL (ref 4.0–10.5)

## 2017-06-21 LAB — BASIC METABOLIC PANEL
Anion gap: 6 (ref 5–15)
BUN: 9 mg/dL (ref 6–20)
CO2: 25 mmol/L (ref 22–32)
Calcium: 7.8 mg/dL — ABNORMAL LOW (ref 8.9–10.3)
Chloride: 106 mmol/L (ref 101–111)
Creatinine, Ser: 0.86 mg/dL (ref 0.61–1.24)
GFR calc Af Amer: >60 mL/min (ref >60)
GFR calc non Af Amer: >60 mL/min (ref >60)
Glucose, Bld: 111 mg/dL — ABNORMAL HIGH (ref 65–99)
Potassium: 3.6 mmol/L (ref 3.5–5.1)
Sodium: 137 mmol/L (ref 135–145)

## 2017-06-21 LAB — GLUCOSE, CAPILLARY
Glucose-Capillary: 119 mg/dL — ABNORMAL HIGH (ref 65–99)
Glucose-Capillary: 124 mg/dL — ABNORMAL HIGH (ref 65–99)
Glucose-Capillary: 106 mg/dL — ABNORMAL HIGH (ref 65–99)
Glucose-Capillary: 134 mg/dL — ABNORMAL HIGH (ref 65–99)
Glucose-Capillary: 112 mg/dL — ABNORMAL HIGH (ref 65–99)
Glucose-Capillary: 109 mg/dL — ABNORMAL HIGH (ref 65–99)

## 2017-06-21 IMAGING — DX DG CHEST 1V PORT
1 series · 1 of 1 positions shown · non-contrast
Comparison: Yesterday

CLINICAL DATA: Chest tube.  Status post esophagectomy for cancer.

EXAM:
PORTABLE CHEST 1 VIEW

[chest ap]
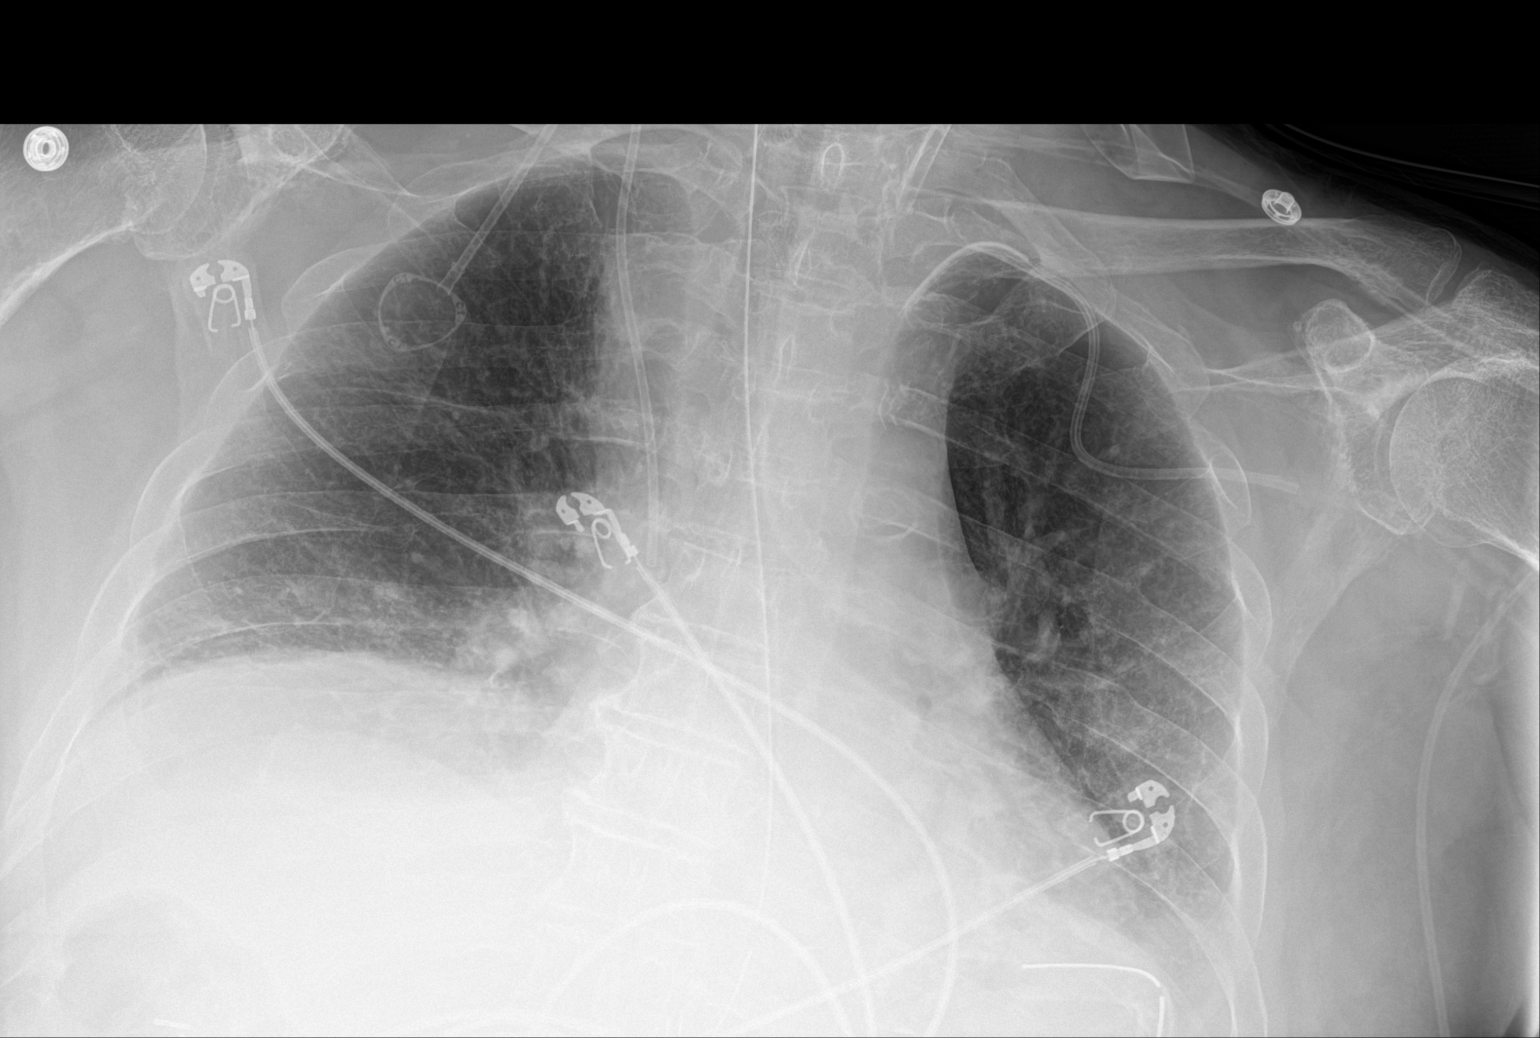

[1 of 1 positions shown; findings below may reference images not displayed]

FINDINGS: Surgical drain at the left thoracic inlet. There is an orogastric
tube with tip not visualized. Drain at the left base. Porta catheter
on the right with tip at the SVC. Left-sided central line with tip
likely at the brachiocephalic vein rather than IMV.

Low volume chest with atelectatic type opacities at the bases. No
evidence of pneumothorax.
IMPRESSION: 1. Stable low volume chest with atelectasis.
2. No visible pneumothorax.
3. Stable hardware positioning as described.

## 2017-06-21 MED ORDER — ENOXAPARIN SODIUM 40 MG/0.4ML ~~LOC~~ SOLN
40.0000 mg | SUBCUTANEOUS | Status: DC
  Administered 2017-06-21 – 2017-06-29 (×9): 40 mg via SUBCUTANEOUS
  Filled 2017-06-21 (×9): qty 0.4

## 2017-06-21 MED ORDER — VITAL 1.5 CAL PO LIQD
1000.0000 mL | ORAL | Status: DC
  Filled 2017-06-21 (×3): qty 1000

## 2017-06-21 MED ORDER — GUAIFENESIN-DM 100-10 MG/5ML PO SYRP: 5.0000 mL | ORAL_SOLUTION | ORAL | Status: DC | PRN

## 2017-06-21 MED ORDER — POTASSIUM CHLORIDE 10 MEQ/100ML IV SOLN
10.0000 meq | INTRAVENOUS | Status: AC
  Administered 2017-06-21 (×3): 10 meq via INTRAVENOUS
  Filled 2017-06-21 (×3): qty 100

## 2017-06-21 NOTE — Anesthesia Postprocedure Evaluation (Signed)
Anesthesia Post Note  Patient: Casey Acevedo  Procedure(s) Performed: cervical ESOPHAGECTOMY COMPLETE (N/A Abdomen) VIDEO BRONCHOSCOPY (N/A Chest) PYLOROMYOTOMY (N/A Abdomen)     Patient location during evaluation: ICU Anesthesia Type: General Level of consciousness: awake Pain management: pain level controlled Vital Signs Assessment: post-procedure vital signs reviewed and stable Respiratory status: spontaneous breathing, nonlabored ventilation, respiratory function stable and patient connected to nasal cannula oxygen Cardiovascular status: blood pressure returned to baseline and stable Postop Assessment: no apparent nausea or vomiting Anesthetic complications: no    Last Vitals:  Vitals:   06/21/17 1200 06/21/17 1201  BP: 110/68   Pulse: (!) 101   Resp: 16   Temp:  36.4 C  SpO2: 96%     Last Pain:  Vitals:   06/21/17 1201  TempSrc: Oral  PainSc:                  Ailey Wessling P Deziyah Arvin

## 2017-06-21 NOTE — Progress Notes (Signed)
CT surgery p.m. Rounds  Maintaining sinus rhythm and ambulating in hallway Tube feeds via J-tube have been initiated No nausea Continue current care

## 2017-06-21 NOTE — Progress Notes (Signed)
Patient ID: ARIEN BENINCASA, male   DOB: 1940-06-24, 77 y.o.   MRN: [id number] TCTS DAILY ICU PROGRESS NOTE                   [address]            [place] [address]          [phone]   3 Days Post-Op Procedure(s) (LRB): cervical ESOPHAGECTOMY COMPLETE (N/A) VIDEO BRONCHOSCOPY (N/A) PYLOROMYOTOMY (N/A)  Total Length of Stay:  LOS: 3 days   Subjective: Patient notes mild nausea no vomiting, tolerating low-dose tube feedings, ambulating around the unit  Objective: Vital signs in last 24 hours: Temp:  [97.5 F (36.4 C)-98.9 F (37.2 C)] 98.4 F (36.9 C) (04/11 0742) Pulse Rate:  [85-115] 93 (04/11 0500) Cardiac Rhythm: Normal sinus rhythm (04/10 2000) Resp:  [10-22] 13 (04/11 0500) BP: (106-154)/(58-90) 114/68 (04/11 0500) SpO2:  [94 %-99 %] 94 % (04/11 0500)  Filed Weights   06/18/17 0543  Weight: 180 lb (81.6 kg)    Weight change:    Hemodynamic parameters for last 24 hours:    Intake/Output from previous day: 04/10 0701 - 04/11 0700 In: 1184.5 [I.V.:1111; NG/GT:73.5] Out: 480 [Urine:305; Drains:20; Chest Tube:155]  Intake/Output this shift: No intake/output data recorded.  Current Meds: Scheduled Meds: . Chlorhexidine Gluconate Cloth  6 each Topical Daily  . fentaNYL   Intravenous Q4H  . insulin aspart  0-24 Units Subcutaneous Q4H  . mouth rinse  15 mL Mouth Rinse BID  . metoCLOPramide (REGLAN) injection  10 mg Intravenous Q6H  . pantoprazole (PROTONIX) IV  40 mg Intravenous Q12H   Continuous Infusions: . dexmedetomidine (PRECEDEX) IV infusion Stopped (06/18/17 1650)  . dextrose 5 % and 0.45% NaCl 75 mL/hr (06/20/17 1810)  . feeding supplement (VITAL 1.5 CAL) 1,000 mL (06/20/17 1539)  . potassium chloride     PRN Meds:.diphenhydrAMINE **OR** diphenhydrAMINE, naloxone **AND** sodium chloride flush, ondansetron (ZOFRAN) IV, potassium chloride, sodium chloride flush  General appearance: alert and cooperative Neurologic:  intact Heart: regular rate and rhythm, S1, S2 normal, no murmur, click, rub or gallop Lungs: diminished breath sounds bibasilar Abdomen: soft, non-tender; bowel sounds normal; no masses,  no organomegaly Extremities: extremities normal, atraumatic, no cyanosis or edema and Homans sign is negative, no sign of DVT Wound: Dressings intact serous drainage from neck incision/drain significantly decreased  Lab Results: CBC: Recent Labs    06/20/17 0520 06/21/17 0521  WBC 13.6* 8.7  HGB 12.3* 11.4*  HCT 37.3* 35.5*  PLT 159 145*   BMET:  Recent Labs    06/20/17 0520 06/21/17 0521  NA 137 137  K 3.9 3.6  CL 104 106  CO2 24 25  GLUCOSE 164* 111*  BUN 14 9  CREATININE 0.99 0.86  CALCIUM 8.1* 7.8*    CMET: Lab Results  Component Value Date   WBC 8.7 06/21/2017   HGB 11.4 (L) 06/21/2017   HCT 35.5 (L) 06/21/2017   PLT 145 (L) 06/21/2017   GLUCOSE 111 (H) 06/21/2017   TRIG 92 12/11/2016   ALT 25 06/14/2017   AST 16 06/14/2017   NA 137 06/21/2017   K 3.6 06/21/2017   CL 106 06/21/2017   CREATININE 0.86 06/21/2017   BUN 9 06/21/2017   CO2 25 06/21/2017   INR 1.00 06/14/2017      PT/INR: No results for input(s): LABPROT, INR in the last 72 hours. Radiology: No results found.   Assessment/Plan: S/P Procedure(s) (LRB): cervical  ESOPHAGECTOMY COMPLETE (N/A) VIDEO BRONCHOSCOPY (N/A) PYLOROMYOTOMY (N/A) Mobilize Hematocrit hemoglobin stable Renal function stable Central line still and was to be removed last night, will DC this morning Chest tube to waterseal Slowly increase tube feedings, possibly remove NG tube tomorrow   Grace Isaac 06/21/2017 7:52 AM

## 2017-06-22 ENCOUNTER — Inpatient Hospital Stay (HOSPITAL_COMMUNITY): Payer: Medicare Other

## 2017-06-22 LAB — BPAM RBC
Blood Product Expiration Date: 201905022359
Blood Product Expiration Date: 201905022359
ISSUE DATE / TIME: 201904080751
ISSUE DATE / TIME: 201904080751
Unit Type and Rh: 5100
Unit Type and Rh: 5100

## 2017-06-22 LAB — BASIC METABOLIC PANEL
Anion gap: 7 (ref 5–15)
BUN: 9 mg/dL (ref 6–20)
CO2: 21 mmol/L — ABNORMAL LOW (ref 22–32)
Calcium: 7.7 mg/dL — ABNORMAL LOW (ref 8.9–10.3)
Chloride: 106 mmol/L (ref 101–111)
Creatinine, Ser: 0.75 mg/dL (ref 0.61–1.24)
GFR calc Af Amer: >60 mL/min (ref >60)
GFR calc non Af Amer: >60 mL/min (ref >60)
Glucose, Bld: 116 mg/dL — ABNORMAL HIGH (ref 65–99)
Potassium: 3.8 mmol/L (ref 3.5–5.1)
Sodium: 134 mmol/L — ABNORMAL LOW (ref 135–145)

## 2017-06-22 LAB — GLUCOSE, CAPILLARY
Glucose-Capillary: 102 mg/dL — ABNORMAL HIGH (ref 65–99)
Glucose-Capillary: 105 mg/dL — ABNORMAL HIGH (ref 65–99)
Glucose-Capillary: 107 mg/dL — ABNORMAL HIGH (ref 65–99)
Glucose-Capillary: 111 mg/dL — ABNORMAL HIGH (ref 65–99)
Glucose-Capillary: 115 mg/dL — ABNORMAL HIGH (ref 65–99)
Glucose-Capillary: 119 mg/dL — ABNORMAL HIGH (ref 65–99)
Glucose-Capillary: 125 mg/dL — ABNORMAL HIGH (ref 65–99)
Glucose-Capillary: 94 mg/dL (ref 65–99)

## 2017-06-22 LAB — TYPE AND SCREEN
ABO/RH(D): O POS
Antibody Screen: NEGATIVE
Unit division: 0
Unit division: 0

## 2017-06-22 LAB — CBC
HCT: 34.7 % — ABNORMAL LOW (ref 39.0–52.0)
Hemoglobin: 11.1 g/dL — ABNORMAL LOW (ref 13.0–17.0)
MCH: 27.6 pg (ref 26.0–34.0)
MCHC: 32 g/dL (ref 30.0–36.0)
MCV: 86.3 fL (ref 78.0–100.0)
Platelets: 148 10*3/uL — ABNORMAL LOW (ref 150–400)
RBC: 4.02 MIL/uL — ABNORMAL LOW (ref 4.22–5.81)
RDW: 15.2 % (ref 11.5–15.5)
WBC: 5.4 10*3/uL (ref 4.0–10.5)

## 2017-06-22 IMAGING — DX DG CHEST 1V PORT
1 series · 1 of 1 positions shown · non-contrast
Comparison: [DATE] and earlier.

CLINICAL DATA: 76-year-old male postoperative day 4 status post
total esophagectomy with Cervical esophagogastric ostomy for
treatment of carcinoma of the distal esophagus.

EXAM:
PORTABLE CHEST 1 VIEW

[chest ap]
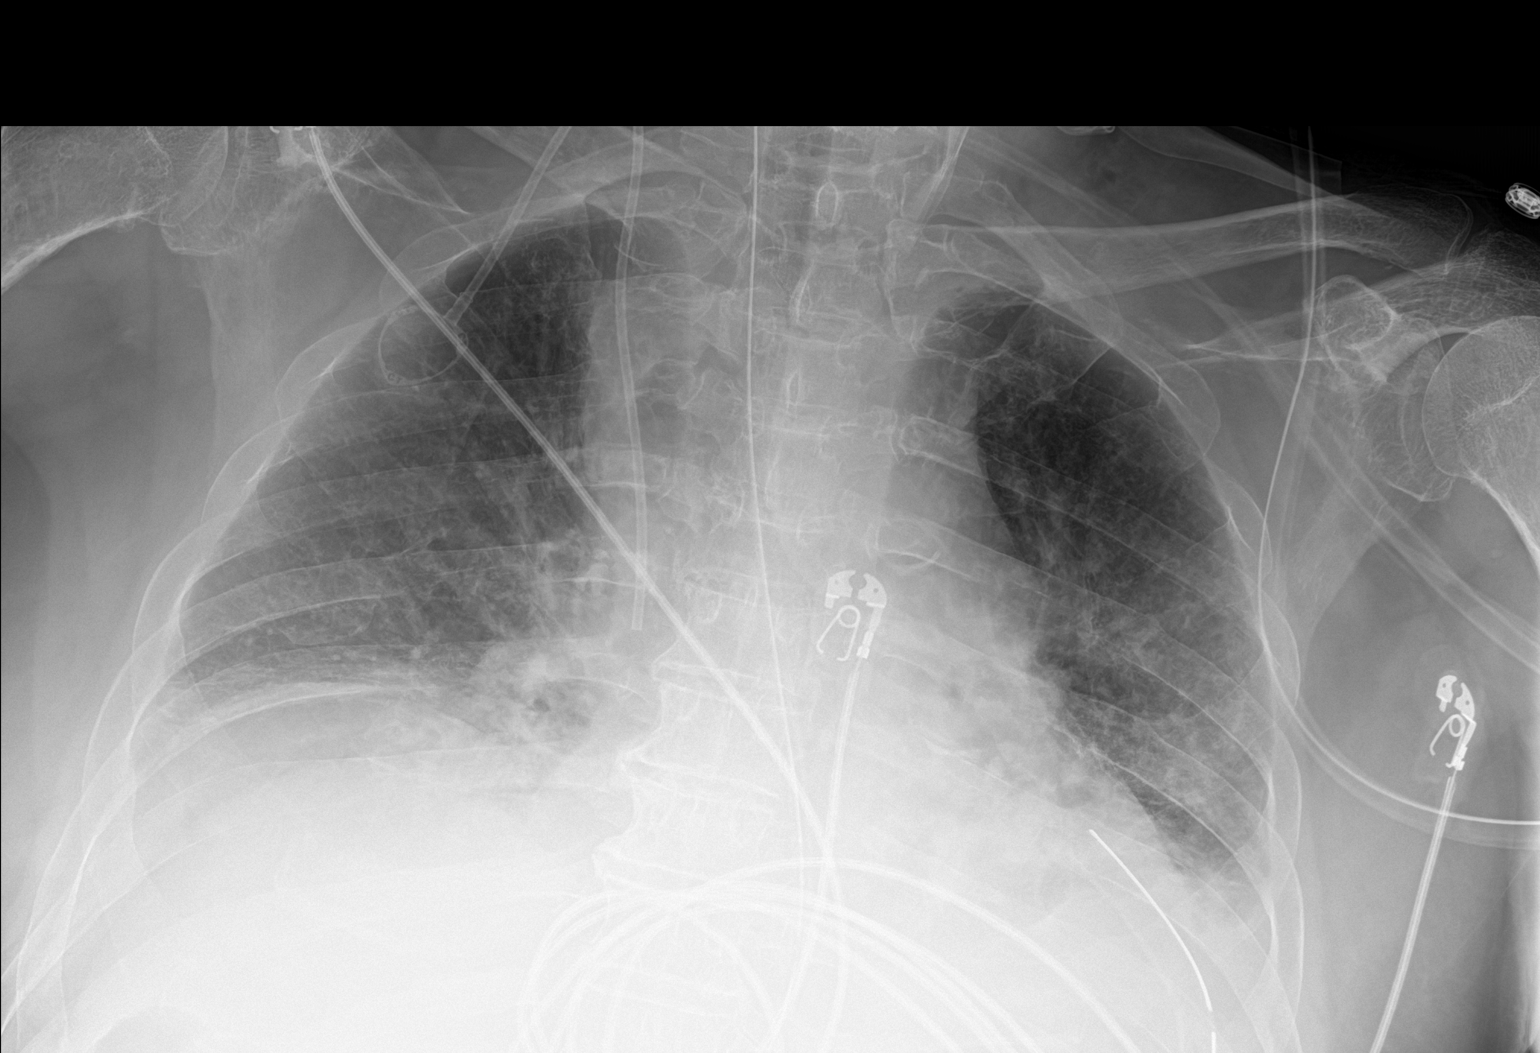

[1 of 1 positions shown; findings below may reference images not displayed]

FINDINGS: Portable AP semi upright view at [PN] hours. Stable left
supraclavicular surgical drain. An enteric tube coursing from the
pharynx into the abdomen appears stable. Left chest tube remains in
place. Right chest power port is stable. Left subclavian central
line has been removed.

Stable low lung volumes. Stable cardiac size and mediastinal
contours. No pneumothorax or pulmonary edema. Patchy bibasilar
opacity most resembles atelectasis. No definite pleural effusion.
Paucity of bowel gas in the visible upper abdomen.
IMPRESSION: 1. Left subclavian central line removed. Otherwise stable lines and
tubes.
2. Low lung volumes with bibasilar opacity, likely atelectasis. No
pneumothorax identified.

## 2017-06-22 MED ORDER — VITAL 1.5 CAL PO LIQD
1000.0000 mL | ORAL | Status: DC
Start: 2017-06-22 — End: 2017-06-24
  Administered 2017-06-22 – 2017-06-24 (×3): 1000 mL
  Filled 2017-06-22 (×5): qty 1000

## 2017-06-22 NOTE — Progress Notes (Signed)
Patient ID: Casey Acevedo, male   DOB: Jan 18, 1941, 77 y.o.   MRN: [id number] TCTS Evening Rounds:  Hemodynamically stable sats 99% Urine output ok Tolerating tube feeds.

## 2017-06-22 NOTE — Progress Notes (Addendum)
TCTS DAILY ICU PROGRESS NOTE                   [address]            [place] [address]          [phone]   4 Days Post-Op Procedure(s) (LRB): cervical ESOPHAGECTOMY COMPLETE (N/A) VIDEO BRONCHOSCOPY (N/A) PYLOROMYOTOMY (N/A)  Total Length of Stay:  LOS: 4 days   Subjective:  No new complaints.  Doing okay, denies N/V, abdominal cramping  Objective: Vital signs in last 24 hours: Temp:  [97.6 F (36.4 C)-98.4 F (36.9 C)] 98.1 F (36.7 C) (04/12 0728) Pulse Rate:  [79-102] 93 (04/12 0700) Cardiac Rhythm: Normal sinus rhythm (04/12 0400) Resp:  [10-20] 19 (04/12 0700) BP: (110-140)/(63-75) 133/74 (04/12 0700) SpO2:  [94 %-97 %] 96 % (04/12 0700) Weight:  [185 lb 6.5 oz (84.1 kg)] 185 lb 6.5 oz (84.1 kg) (04/11 1200)  Filed Weights   06/18/17 0543 06/21/17 0500 06/21/17 1200  Weight: 180 lb (81.6 kg) 185 lb 6.5 oz (84.1 kg) 185 lb 6.5 oz (84.1 kg)    Weight change: 0 lb (0 kg)   Intake/Output from previous day: 04/11 0701 - 04/12 0700 In: 1883.5 [I.V.:1305.8; NG/GT:477.7; IV Piggyback:100] Out: 1480 [Urine:750; Emesis/NG output:600; Chest Tube:130]  Current Meds: Scheduled Meds: . enoxaparin (LOVENOX) injection  40 mg Subcutaneous Q24H  . feeding supplement (VITAL 1.5 CAL)  1,000 mL Per Tube Q24H  . fentaNYL   Intravenous Q4H  . insulin aspart  0-24 Units Subcutaneous Q4H  . mouth rinse  15 mL Mouth Rinse BID  . metoCLOPramide (REGLAN) injection  10 mg Intravenous Q6H  . pantoprazole (PROTONIX) IV  40 mg Intravenous Q12H   Continuous Infusions: . dextrose 5 % and 0.45% NaCl 50 mL/hr at 06/22/17 0700  . potassium chloride     PRN Meds:.diphenhydrAMINE **OR** diphenhydrAMINE, guaiFENesin-dextromethorphan, naloxone **AND** sodium chloride flush, ondansetron (ZOFRAN) IV, potassium chloride, sodium chloride flush  General appearance: alert, cooperative and no distress Heart: regular rate and rhythm Lungs: clear to auscultation  bilaterally Abdomen: soft, hypoactive BS Extremities: extremities normal, atraumatic, no cyanosis or edema Wound: clean and dry  Lab Results: CBC: Recent Labs    06/21/17 0521 06/22/17 0306  WBC 8.7 5.4  HGB 11.4* 11.1*  HCT 35.5* 34.7*  PLT 145* 148*   BMET:  Recent Labs    06/21/17 0521 06/22/17 0306  NA 137 134*  K 3.6 3.8  CL 106 106  CO2 25 21*  GLUCOSE 111* 116*  BUN 9 9  CREATININE 0.86 0.75  CALCIUM 7.8* 7.7*    CMET: Lab Results  Component Value Date   WBC 5.4 06/22/2017   HGB 11.1 (L) 06/22/2017   HCT 34.7 (L) 06/22/2017   PLT 148 (L) 06/22/2017   GLUCOSE 116 (H) 06/22/2017   TRIG 92 12/11/2016   ALT 25 06/14/2017   AST 16 06/14/2017   NA 134 (L) 06/22/2017   K 3.8 06/22/2017   CL 106 06/22/2017   CREATININE 0.75 06/22/2017   BUN 9 06/22/2017   CO2 21 (L) 06/22/2017   INR 1.00 06/14/2017      PT/INR: No results for input(s): LABPROT, INR in the last 72 hours. Radiology: No results found.   Assessment/Plan: S/P Procedure(s) (LRB): cervical ESOPHAGECTOMY COMPLETE (N/A) VIDEO BRONCHOSCOPY (N/A) PYLOROMYOTOMY (N/A)  1. S/p Esophagectomy- chest tube still draining, will leave in place today, will remove NG tube as output has decreased 2. GI- will  increase tube feeds 40 cc/hr on tube feeds, continue reglan, BS remain hypoactive 3. CV- hemodynamically stable, NSR with occasional PVC, tachycardia has improved 4. Expected post operative blood loss anemia, stable 5. CBGs controlled 6. 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  Grace Isaac MD Beeper [phone] Office [phone] 06/22/2017 7:49 AM

## 2017-06-23 ENCOUNTER — Inpatient Hospital Stay (HOSPITAL_COMMUNITY): Payer: Medicare Other

## 2017-06-23 LAB — BASIC METABOLIC PANEL
Anion gap: 7 (ref 5–15)
BUN: 7 mg/dL (ref 6–20)
CO2: 25 mmol/L (ref 22–32)
Calcium: 7.9 mg/dL — ABNORMAL LOW (ref 8.9–10.3)
Chloride: 104 mmol/L (ref 101–111)
Creatinine, Ser: 0.73 mg/dL (ref 0.61–1.24)
GFR calc Af Amer: >60 mL/min (ref >60)
GFR calc non Af Amer: >60 mL/min (ref >60)
Glucose, Bld: 115 mg/dL — ABNORMAL HIGH (ref 65–99)
Potassium: 3.5 mmol/L (ref 3.5–5.1)
Sodium: 136 mmol/L (ref 135–145)

## 2017-06-23 LAB — GLUCOSE, CAPILLARY
Glucose-Capillary: 96 mg/dL (ref 65–99)
Glucose-Capillary: 110 mg/dL — ABNORMAL HIGH (ref 65–99)
Glucose-Capillary: 126 mg/dL — ABNORMAL HIGH (ref 65–99)
Glucose-Capillary: 112 mg/dL — ABNORMAL HIGH (ref 65–99)
Glucose-Capillary: 116 mg/dL — ABNORMAL HIGH (ref 65–99)
Glucose-Capillary: 122 mg/dL — ABNORMAL HIGH (ref 65–99)

## 2017-06-23 LAB — CBC
HCT: 34.6 % — ABNORMAL LOW (ref 39.0–52.0)
Hemoglobin: 11.2 g/dL — ABNORMAL LOW (ref 13.0–17.0)
MCH: 27.4 pg (ref 26.0–34.0)
MCHC: 32.4 g/dL (ref 30.0–36.0)
MCV: 84.6 fL (ref 78.0–100.0)
Platelets: 185 10*3/uL (ref 150–400)
RBC: 4.09 MIL/uL — ABNORMAL LOW (ref 4.22–5.81)
RDW: 14.7 % (ref 11.5–15.5)
WBC: 4.3 10*3/uL (ref 4.0–10.5)

## 2017-06-23 IMAGING — DX DG CHEST 1V PORT
1 series · 1 of 1 positions shown · non-contrast
Comparison: [DATE]

CLINICAL DATA: Chest tube in place.

EXAM:
PORTABLE CHEST 1 VIEW

[chest ap]
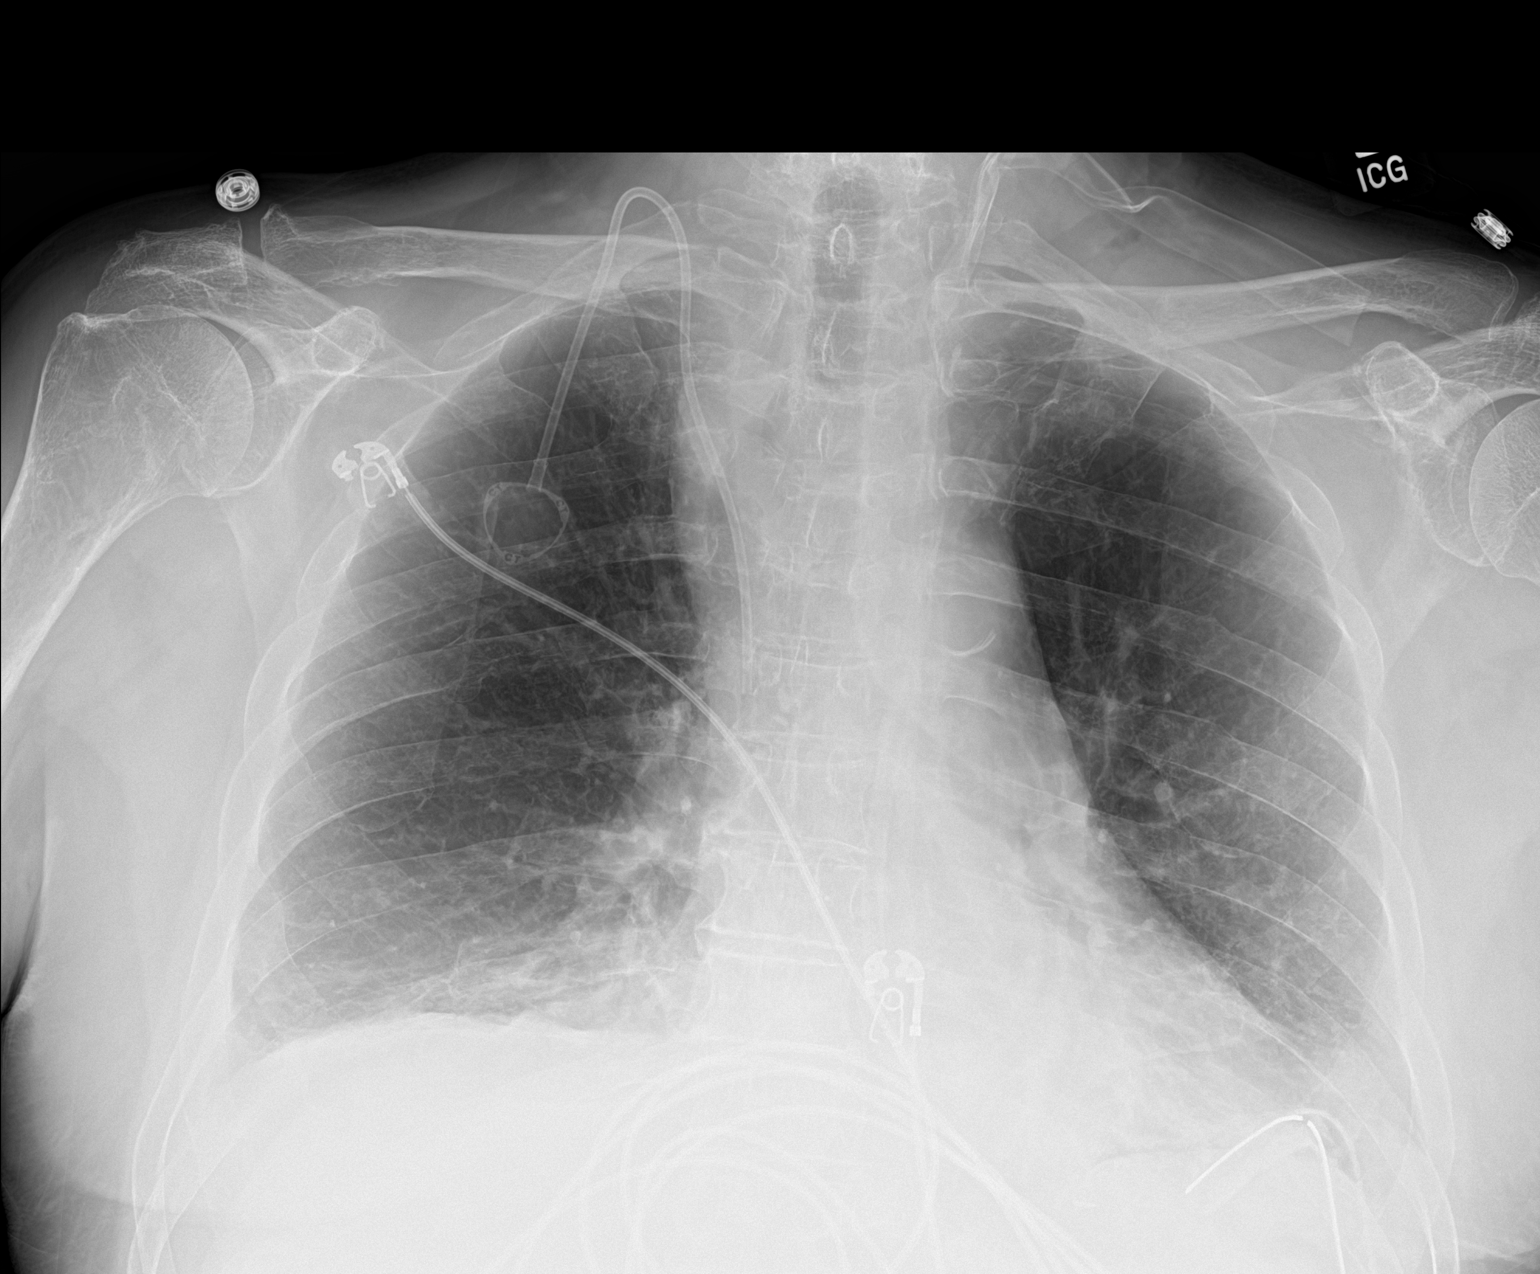

[1 of 1 positions shown; findings below may reference images not displayed]

FINDINGS: A left-sided chest tube remains. No significant pneumothorax
identified. Mild bibasilar atelectasis remains. The
cardiomediastinal silhouette is stable. No other changes.
IMPRESSION: 1. The NG tube is been removed. A left chest tube remains without
significant pneumothorax. Stable right Port-A-Cath.
2. Mild bibasilar opacities most consistent with atelectasis.

## 2017-06-23 MED ORDER — POTASSIUM CHLORIDE 10 MEQ/100ML IV SOLN
10.0000 meq | INTRAVENOUS | Status: AC
  Administered 2017-06-23 (×3): 10 meq via INTRAVENOUS
  Filled 2017-06-23 (×3): qty 100

## 2017-06-23 MED ORDER — LOPERAMIDE HCL 1 MG/5ML PO LIQD
4.0000 mg | Freq: Once | ORAL | Status: AC
  Administered 2017-06-23: 4 mg
  Filled 2017-06-23 (×2): qty 20

## 2017-06-23 MED ORDER — LOPERAMIDE HCL 1 MG/5ML PO LIQD
2.0000 mg | ORAL | Status: DC | PRN
  Administered 2017-06-25 – 2017-06-28 (×4): 2 mg
  Filled 2017-06-23 (×6): qty 10

## 2017-06-23 NOTE — Progress Notes (Signed)
Patient ID: Casey Acevedo, male   DOB: 1940/03/24, 77 y.o.   MRN: [id number] TCTS Evening Rounds:  Hemodynamically stable. sats 98% Chest tube 150 cc out since early this am.

## 2017-06-23 NOTE — Progress Notes (Signed)
5 Days Post-Op Procedure(s) (LRB): cervical ESOPHAGECTOMY COMPLETE (N/A) VIDEO BRONCHOSCOPY (N/A) PYLOROMYOTOMY (N/A) Subjective: Complains of frequent loose stools which he has had before on tube feeds. Also reports urinary urgency and frequency today which is new.  Objective: Vital signs in last 24 hours: Temp:  [97.8 F (36.6 C)-98.5 F (36.9 C)] 98 F (36.7 C) (04/13 0729) Pulse Rate:  [72-107] 82 (04/13 1000) Cardiac Rhythm: Normal sinus rhythm (04/13 0750) Resp:  [11-27] 13 (04/13 1000) BP: (111-148)/(59-86) 148/76 (04/13 1000) SpO2:  [90 %-100 %] 96 % (04/13 1000) Weight:  [84.4 kg (186 lb 1.6 oz)] 84.4 kg (186 lb 1.6 oz) (04/13 0500)  Hemodynamic parameters for last 24 hours:    Intake/Output from previous day: 04/12 0701 - 04/13 0700 In: 2205 [I.V.:1191.7; NG/GT:813.3; IV Piggyback:200] Out: 1536 [Urine:1025; Stool:1; Chest Tube:510] Intake/Output this shift: Total I/O In: 295 [I.V.:108.3; NG/GT:86.7; IV Piggyback:100] Out: 40 [Chest Tube:40]  General appearance: alert and cooperative Heart: regular rate and rhythm, S1, S2 normal, no murmur, click, rub or gallop Lungs: clear to auscultation bilaterally Abdomen: soft, non-tender; bowel sounds normal; no masses,  no organomegaly Wound: incision healing well chest tube output serous.  Lab Results: Recent Labs    06/22/17 0306 06/23/17 0204  WBC 5.4 4.3  HGB 11.1* 11.2*  HCT 34.7* 34.6*  PLT 148* 185   BMET:  Recent Labs    06/22/17 0306 06/23/17 0204  NA 134* 136  K 3.8 3.5  CL 106 104  CO2 21* 25  GLUCOSE 116* 115*  BUN 9 7  CREATININE 0.75 0.73  CALCIUM 7.7* 7.9*    PT/INR: No results for input(s): LABPROT, INR in the last 72 hours. ABG    Component Value Date/Time   PHART 7.443 06/19/2017 0407   HCO3 22.5 06/19/2017 0407   TCO2 23 06/18/2017 1849   ACIDBASEDEF 1.1 06/19/2017 0407   O2SAT 94.5 06/19/2017 0407   CBG (last 3)  Recent Labs    06/22/17 2321 06/23/17 0333 06/23/17 0726   GLUCAP 105* 96 110*   CXR: ok  Assessment/Plan: S/P Procedure(s) (LRB): cervical ESOPHAGECTOMY COMPLETE (N/A) VIDEO BRONCHOSCOPY (N/A) PYLOROMYOTOMY (N/A)  Hemodynamically stable in sinus rhythm  Tube feeds at 40 with goal of 60. Will start Imodium to get loose stools under control before advancing to goal.  Check urine culture.  Continue chest tube today since it put out 220 cc since this am.  IS, ambulation.  Plan esophagram Monday.   LOS: 5 days    Gaye Pollack 06/23/2017

## 2017-06-23 NOTE — Progress Notes (Signed)
Waste 4 mls of fentanyl with Charity fundraiser at [employer].

## 2017-06-24 LAB — GLUCOSE, CAPILLARY
Glucose-Capillary: 102 mg/dL — ABNORMAL HIGH (ref 65–99)
Glucose-Capillary: 103 mg/dL — ABNORMAL HIGH (ref 65–99)
Glucose-Capillary: 96 mg/dL (ref 65–99)
Glucose-Capillary: 144 mg/dL — ABNORMAL HIGH (ref 65–99)

## 2017-06-24 LAB — URINE CULTURE: Special Requests: NORMAL

## 2017-06-24 MED ORDER — VITAL 1.5 CAL PO LIQD: 1000.0000 mL | ORAL | Status: DC

## 2017-06-24 MED ORDER — VITAL 1.5 CAL PO LIQD
1000.0000 mL | ORAL | Status: DC
  Administered 2017-06-24 – 2017-06-28 (×4): 1000 mL
  Filled 2017-06-24 (×5): qty 1000

## 2017-06-24 NOTE — Progress Notes (Signed)
6 Days Post-Op Procedure(s) (LRB): cervical ESOPHAGECTOMY COMPLETE (N/A) VIDEO BRONCHOSCOPY (N/A) PYLOROMYOTOMY (N/A) Subjective: No complaints. Loose stools resolved with Imodium.  Objective: Vital signs in last 24 hours: Temp:  [98 F (36.7 C)-98.4 F (36.9 C)] 98.1 F (36.7 C) (04/14 0735) Pulse Rate:  [76-94] 78 (04/14 0900) Cardiac Rhythm: Normal sinus rhythm (04/14 0800) Resp:  [12-21] 12 (04/14 0900) BP: (107-147)/(64-97) 122/64 (04/14 0900) SpO2:  [95 %-99 %] 97 % (04/14 0900) Weight:  [83.5 kg (184 lb 1.4 oz)] 83.5 kg (184 lb 1.4 oz) (04/14 0600)  Hemodynamic parameters for last 24 hours:    Intake/Output from previous day: 04/13 0701 - 04/14 0700 In: 2275 [I.V.:1208.3; NG/GT:966.7; IV Piggyback:100] Out: 1970 [Urine:1730; Chest Tube:240] Intake/Output this shift: Total I/O In: 205 [I.V.:165; NG/GT:40] Out: 110 [Chest Tube:110]  General appearance: alert and cooperative Heart: regular rate and rhythm, S1, S2 normal, no murmur, click, rub or gallop Lungs: clear to auscultation bilaterally Abdomen: soft, non-tender; bowel sounds normal; no masses,  no organomegaly Wound: incisions ok chest tube output serous.  Lab Results: Recent Labs    06/22/17 0306 06/23/17 0204  WBC 5.4 4.3  HGB 11.1* 11.2*  HCT 34.7* 34.6*  PLT 148* 185   BMET:  Recent Labs    06/22/17 0306 06/23/17 0204  NA 134* 136  K 3.8 3.5  CL 106 104  CO2 21* 25  GLUCOSE 116* 115*  BUN 9 7  CREATININE 0.75 0.73  CALCIUM 7.7* 7.9*    PT/INR: No results for input(s): LABPROT, INR in the last 72 hours. ABG    Component Value Date/Time   PHART 7.443 06/19/2017 0407   HCO3 22.5 06/19/2017 0407   TCO2 23 06/18/2017 1849   ACIDBASEDEF 1.1 06/19/2017 0407   O2SAT 94.5 06/19/2017 0407   CBG (last 3)  Recent Labs    06/23/17 2328 06/24/17 0406 06/24/17 0732  GLUCAP 122* 102* 103*    Assessment/Plan: S/P Procedure(s) (LRB): cervical ESOPHAGECTOMY COMPLETE (N/A) VIDEO  BRONCHOSCOPY (N/A) PYLOROMYOTOMY (N/A)  Hemodynamically stable in sinus rhythm  Tube feeds at 40 with goal of 60. Will advance to goal since loose stools resolved.  Urine culture pending  Continue chest tube today since it put out 240 cc for 24 hrs and 110  since this am.  IS, ambulation.  Plan esophagram Monday.      LOS: 6 days    Gaye Pollack 06/24/2017

## 2017-06-24 NOTE — Progress Notes (Signed)
Patient ID: Casey Acevedo, male   DOB: [birth year], 77 y.o.   MRN: [id number] T[facility] evening rounds:  Hemodynamically stable No problems today. Esophagram tomorrow.

## 2017-06-25 ENCOUNTER — Inpatient Hospital Stay (HOSPITAL_COMMUNITY): Payer: Medicare Other

## 2017-06-25 ENCOUNTER — Telehealth: Payer: Self-pay

## 2017-06-25 LAB — GLUCOSE, CAPILLARY
Glucose-Capillary: 112 mg/dL — ABNORMAL HIGH (ref 65–99)
Glucose-Capillary: 119 mg/dL — ABNORMAL HIGH (ref 65–99)
Glucose-Capillary: 122 mg/dL — ABNORMAL HIGH (ref 65–99)
Glucose-Capillary: 122 mg/dL — ABNORMAL HIGH (ref 65–99)
Glucose-Capillary: 123 mg/dL — ABNORMAL HIGH (ref 65–99)
Glucose-Capillary: 145 mg/dL — ABNORMAL HIGH (ref 65–99)

## 2017-06-25 LAB — CBC
HCT: 35.5 % — ABNORMAL LOW (ref 39.0–52.0)
Hemoglobin: 11.5 g/dL — ABNORMAL LOW (ref 13.0–17.0)
MCH: 27.4 pg (ref 26.0–34.0)
MCHC: 32.4 g/dL (ref 30.0–36.0)
MCV: 84.5 fL (ref 78.0–100.0)
Platelets: 224 K/uL (ref 150–400)
RBC: 4.2 MIL/uL — ABNORMAL LOW (ref 4.22–5.81)
RDW: 15.1 % (ref 11.5–15.5)
WBC: 5.7 K/uL (ref 4.0–10.5)

## 2017-06-25 LAB — BASIC METABOLIC PANEL WITH GFR
Anion gap: 9 (ref 5–15)
BUN: 8 mg/dL (ref 6–20)
CO2: 24 mmol/L (ref 22–32)
Calcium: 8.2 mg/dL — ABNORMAL LOW (ref 8.9–10.3)
Chloride: 104 mmol/L (ref 101–111)
Creatinine, Ser: 0.75 mg/dL (ref 0.61–1.24)
GFR calc Af Amer: >60 mL/min
GFR calc non Af Amer: >60 mL/min
Glucose, Bld: 124 mg/dL — ABNORMAL HIGH (ref 65–99)
Potassium: 3.7 mmol/L (ref 3.5–5.1)
Sodium: 137 mmol/L (ref 135–145)

## 2017-06-25 IMAGING — RF DG ESOPHAGUS
11 series · 15 of 24 positions shown · non-contrast
Comparison: CT scan of the chest dated [DATE]

CLINICAL DATA: Esophageal carcinoma. Status post complete
esophagectomy with cervical esophagogastrostomy and pyloromyotomy.

EXAM:
ESOPHOGRAM/BARIUM SWALLOW
TECHNIQUE: Single contrast examination was performed using water-soluble
contrast.
FLUOROSCOPY TIME:  Fluoroscopy Time:  2 minutes 0 seconds

[Series 1: cp_standard · 0.55mm/px · 2 of 84 frames shown (1 of 4)]
[frame 13/84]
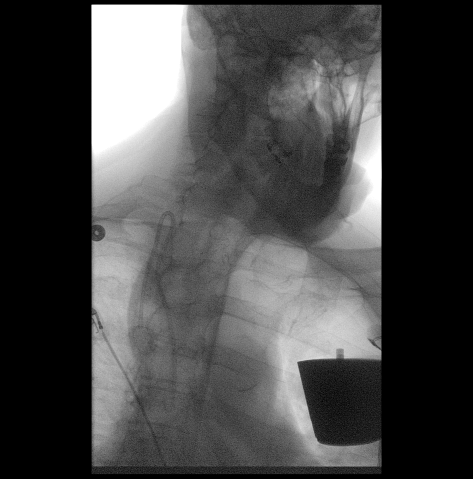
[frame 77/84]
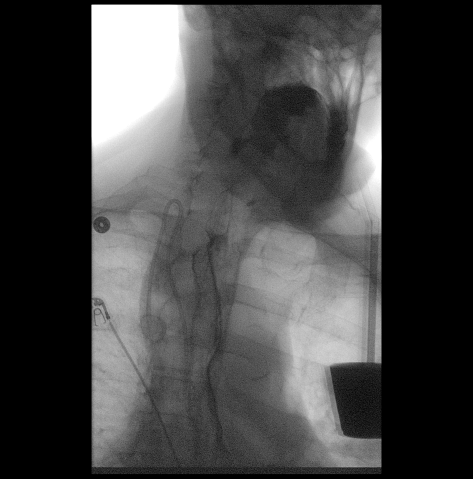

[Series 2: fluoro_barium 2fps_bw · 0.18mm/px · 1 of 2 frames shown (1 of 7)]
[frame 2/2]
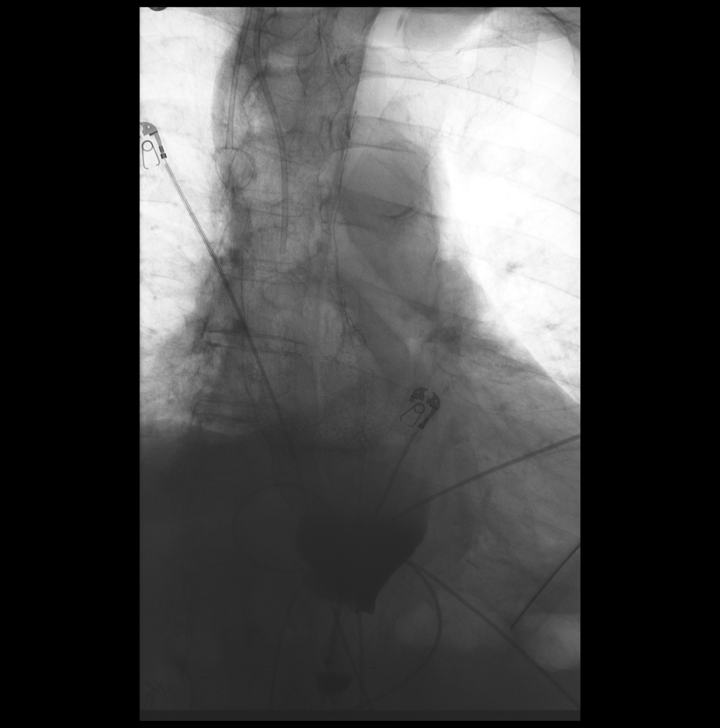

[Series 4: fluoro_barium 2fps_bw · 0.18mm/px · 1 of 2 frames shown (2 of 7)]
[frame 1/2]
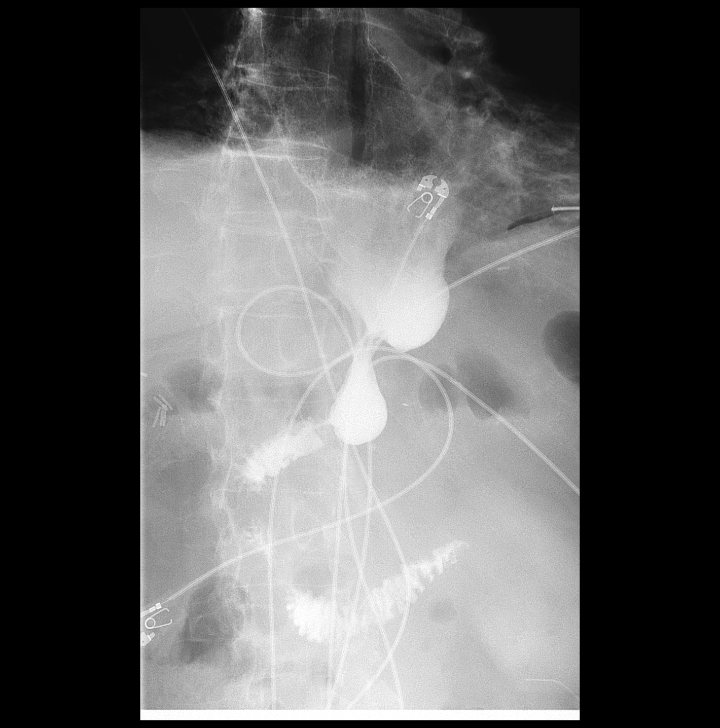

[Series 5: cp_standard · 0.52mm/px · 2 of 80 frames shown (2 of 4)]
[frame 13/80]
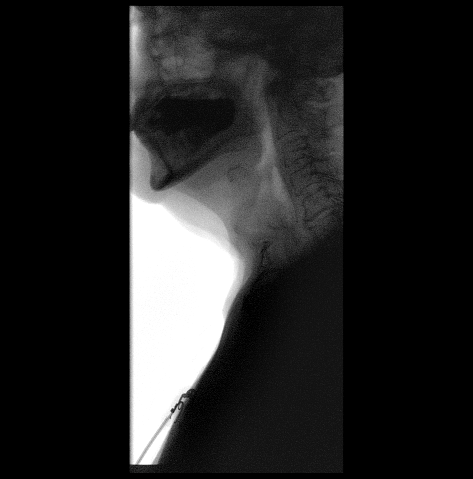
[frame 14/80]
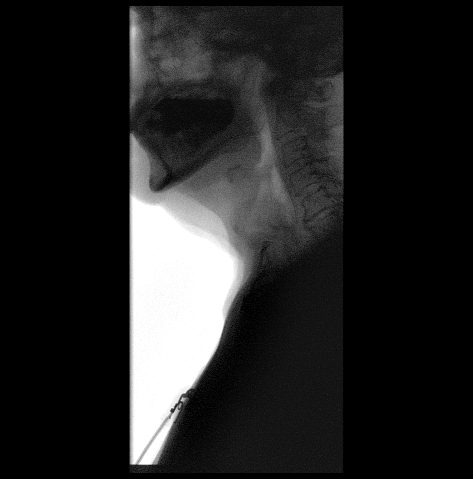

[Series 6: cp_standard · 0.53mm/px · 2 of 128 frames shown (3 of 4)]
[frame 20/128]
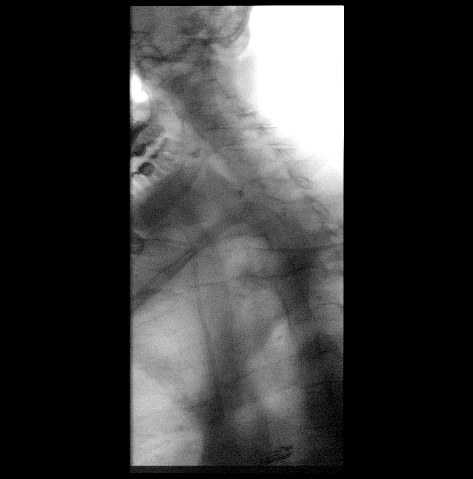
[frame 109/128]
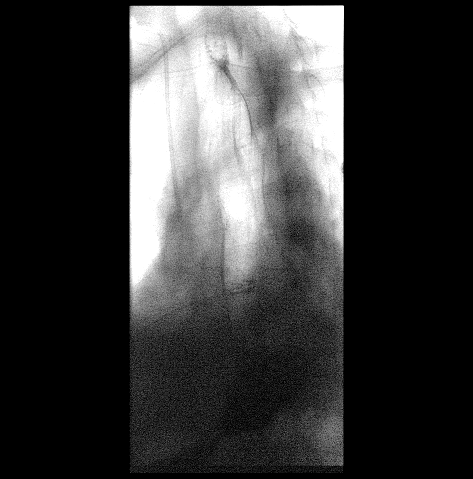

[Series 7: fluoro_barium 2fps_bw · 0.17mm/px · 1 of 2 frames shown (3 of 7)]
[frame 1/2]
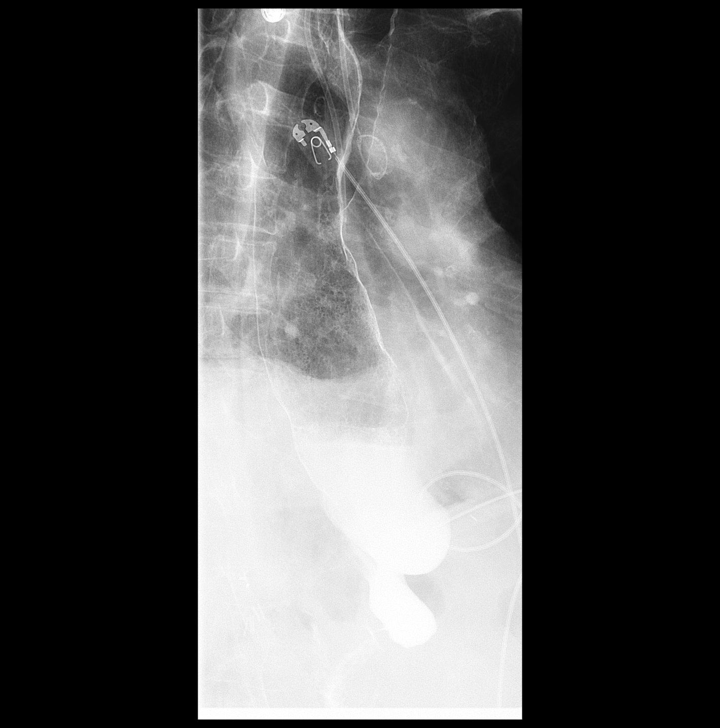

[Series 8: cp_standard · 0.52mm/px · 2 of 75 frames shown (4 of 4)]
[frame 38/75]
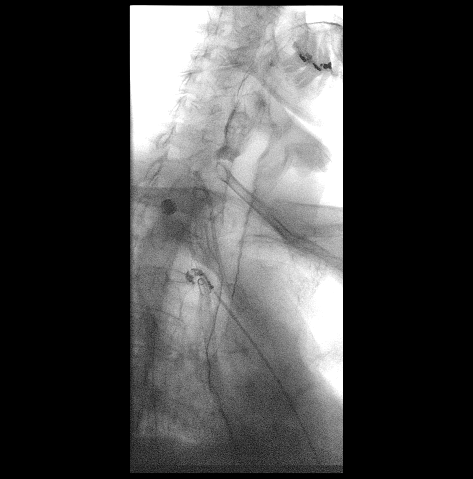
[frame 41/75]
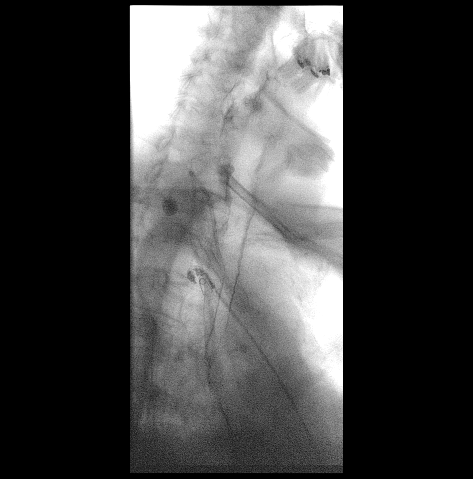

[Series 9: fluoro_barium 2fps_bw · 0.17mm/px · 1 of 2 frames shown (4 of 7)]
[frame 1/2]
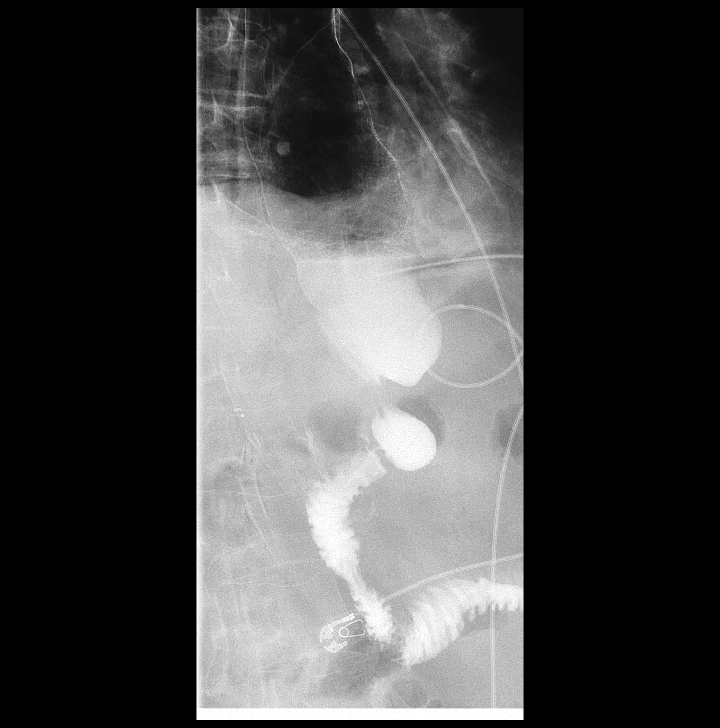

[Series 10: fluoro_barium 2fps_bw · 0.19mm/px · 1 of 2 frames shown (5 of 7)]
[frame 2/2]
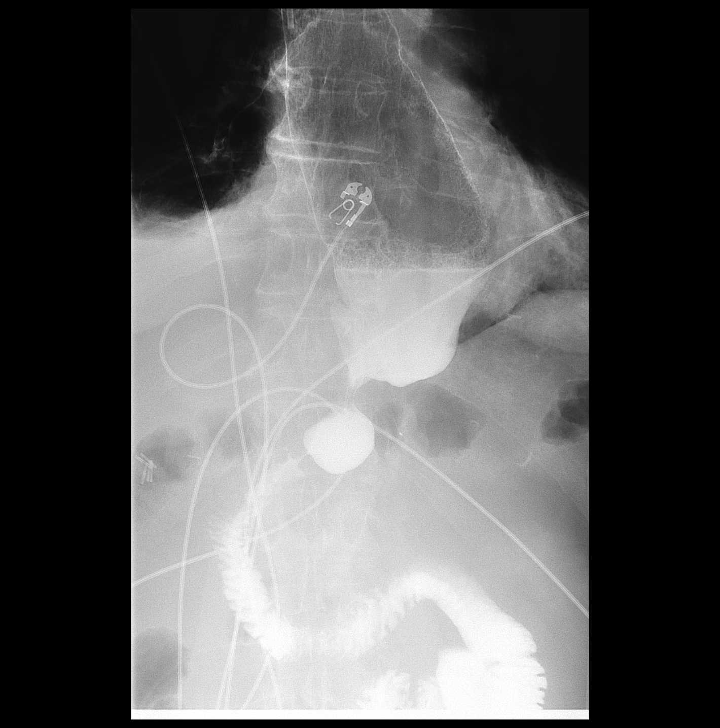

[Series 11: fluoro_barium 2fps_bw · 0.19mm/px · 1 of 2 frames shown (6 of 7)]
[frame 1/2]
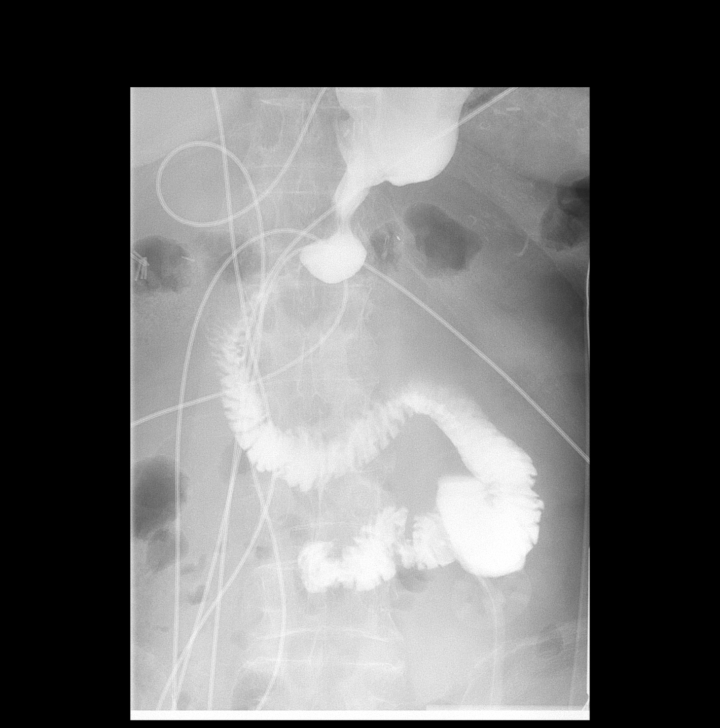

[Series 12: fluoro_barium 2fps_bw · 0.19mm/px · 1 of 2 frames shown (7 of 7)]
[frame 2/2]
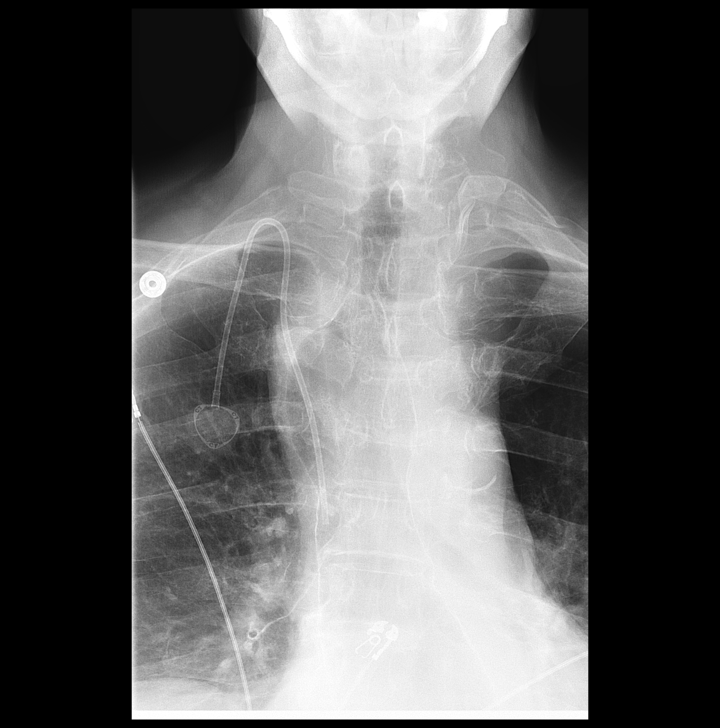

[15 of 24 positions shown; findings below may reference images not displayed]

FINDINGS: The water-soluble esophagram demonstrates no evidence of leakage at
the cervical anastomosis. Contrast flows freely into the duodenum
and proximal small bowel.

The patient did have 2 episodes of symptomatic aspiration of water
soluble contrast. However, the patient took large sips of contrast
at each of these times and with smaller sips there was no evidence
of laryngeal penetration or aspiration.
IMPRESSION: 1. No evidence of leak after cervical esophagogastroscopy.
2. Two episodes of symptomatic aspiration of water soluble contrast
with large sips. No aspiration with smaller sips.

## 2017-06-25 IMAGING — DX DG CHEST 1V PORT
1 series · 1 of 1 positions shown · non-contrast
Comparison: One-view chest x-ray [DATE]

CLINICAL DATA: Esophagectomy.

EXAM:
PORTABLE CHEST 1 VIEW

[chest ap]
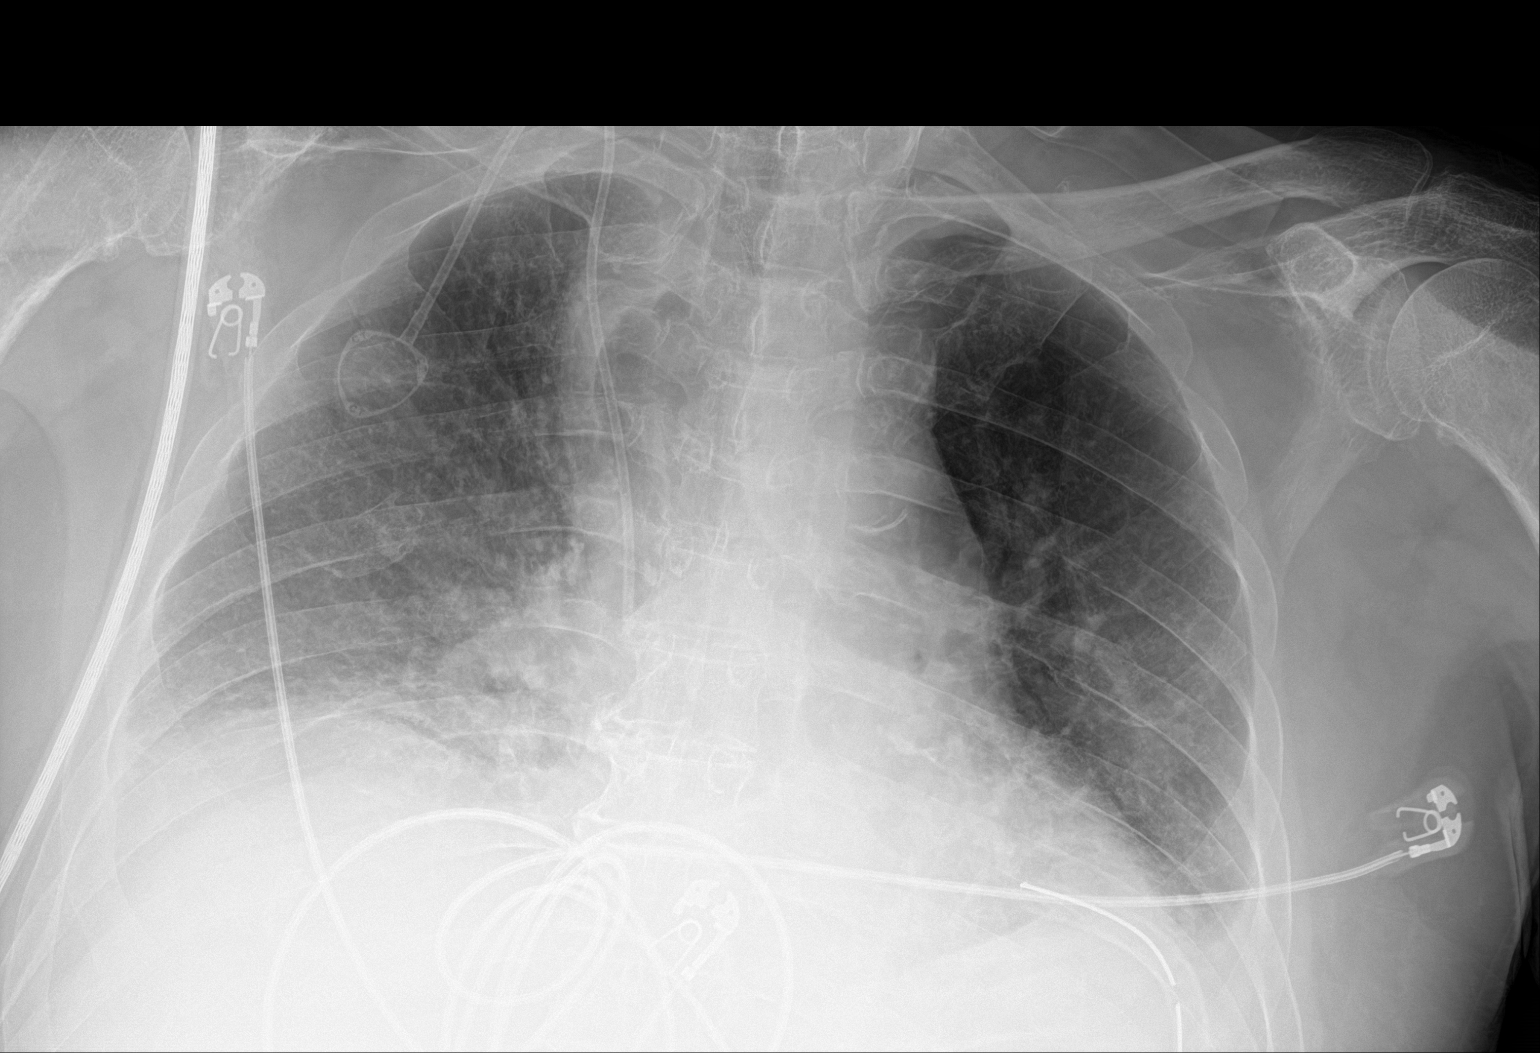

[1 of 1 positions shown; findings below may reference images not displayed]

FINDINGS: Heart size is exaggerated by low lung volumes. A right IJ
Port-A-Cath terminates at the cavoatrial junction. The drain at the
thoracic inlet is again noted. A left basilar chest tube is in place
without pneumothorax. Lung volumes have decreased since the prior
exam. Bilateral pleural effusions are noted. Bibasilar airspace
disease likely reflects atelectasis.
IMPRESSION: 1. Decreased lung volumes since the prior study.
2. Increasing interstitial edema and bibasilar airspace disease,
likely atelectasis.
3. Small bilateral pleural effusions.

## 2017-06-25 MED ORDER — IOPAMIDOL (ISOVUE-300) INJECTION 61%
INTRAVENOUS | Status: AC
  Filled 2017-06-25: qty 150

## 2017-06-25 MED ORDER — FAMOTIDINE IN NACL 20-0.9 MG/50ML-% IV SOLN
20.0000 mg | Freq: Two times a day (BID) | INTRAVENOUS | Status: DC
  Administered 2017-06-25 – 2017-06-27 (×4): 20 mg via INTRAVENOUS
  Filled 2017-06-25 (×4): qty 50

## 2017-06-25 MED ORDER — FUROSEMIDE 10 MG/ML IJ SOLN
20.0000 mg | Freq: Once | INTRAMUSCULAR | Status: AC
  Administered 2017-06-25: 20 mg via INTRAVENOUS
  Filled 2017-06-25: qty 2

## 2017-06-25 MED ORDER — IOPAMIDOL (ISOVUE-300) INJECTION 61%: 150.0000 mL | Freq: Once | INTRAVENOUS | Status: DC | PRN

## 2017-06-25 NOTE — Telephone Encounter (Signed)
Nutrition  Noted hospital admission and s/p surgery.    Called patient mobile number and spoke with patient this pm.  Reports that he is doing well, tolerating tube feeding while inpatient.  Reports that he is hoping to be able to try some ice chips, water soon. Inpatient RD following.   Will plan to follow-up with patient once discharged.     Mohini Heathcock B. Zenia Resides, [place], [place] Registered Dietitian [phone] (pager)

## 2017-06-25 NOTE — Progress Notes (Signed)
      [address]       [place] [address]             [phone]      BP 128/67   Pulse 80   Temp 97.9 F (36.6 C) (Oral)   Resp 20   Ht 5\' 9"  (1.753 m)   Wt 181 lb 4.8 oz (82.2 kg)   SpO2 98%   BMI 26.77 kg/m   ESOPHOGRAM/BARIUM SWALLOW  TECHNIQUE: Single contrast examination was performed using water-soluble contrast.  FLUOROSCOPY TIME:  Fluoroscopy Time:  2 minutes 0 seconds  COMPARISON:  CT scan of the chest dated 06/11/2017  FINDINGS: The water-soluble esophagram demonstrates no evidence of leakage at the cervical anastomosis. Contrast flows freely into the duodenum and proximal small bowel.  The patient did have 2 episodes of symptomatic aspiration of water soluble contrast. However, the patient took large sips of contrast at each of these times and with smaller sips there was no evidence of laryngeal penetration or aspiration.  IMPRESSION: 1. No evidence of leak after cervical esophagogastroscopy. 2. Two episodes of symptomatic aspiration of water soluble contrast with large sips. No aspiration with smaller sips.   Electronically Signed   By: Lorriane Shire M.D.   On: 06/25/2017 13:44  On clear liquids  Remo Lipps C. Roxan Hockey, MD [facility] [phone]

## 2017-06-25 NOTE — Progress Notes (Addendum)
TCTS DAILY ICU PROGRESS NOTE                   [address]            [place] [address]          [phone]   7 Days Post-Op Procedure(s) (LRB): cervical ESOPHAGECTOMY COMPLETE (N/A) VIDEO BRONCHOSCOPY (N/A) PYLOROMYOTOMY (N/A)  Total Length of Stay:  LOS: 7 days   Subjective:  No new complaints.  Diarrhea has stopped with Immodium.  He denies N/V  Objective: Vital signs in last 24 hours: Temp:  [98.1 F (36.7 C)-98.3 F (36.8 C)] 98.3 F (36.8 C) (04/15 0400) Pulse Rate:  [78-101] 86 (04/15 0700) Cardiac Rhythm: Normal sinus rhythm (04/14 1930) Resp:  [12-24] 18 (04/15 0700) BP: (86-143)/(50-79) 118/66 (04/15 0700) SpO2:  [94 %-99 %] 95 % (04/15 0700) FiO2 (%):  [95 %] 95 % (04/15 0011) Weight:  [181 lb 4.8 oz (82.2 kg)] 181 lb 4.8 oz (82.2 kg) (04/15 0600)  Filed Weights   06/23/17 0500 06/24/17 0600 06/25/17 0600  Weight: 186 lb 1.6 oz (84.4 kg) 184 lb 1.4 oz (83.5 kg) 181 lb 4.8 oz (82.2 kg)    Weight change: -2 lb 12.6 oz (-1.263 kg)   Intake/Output from previous day: 04/14 0701 - 04/15 0700 In: 2432.5 [I.V.:1162.5; NG/GT:1270] Out: 2940 [Urine:2100; Drains:450; Chest Tube:390]  Current Meds: Scheduled Meds: . enoxaparin (LOVENOX) injection  40 mg Subcutaneous Q24H  . fentaNYL   Intravenous Q4H  . insulin aspart  0-24 Units Subcutaneous Q4H  . iopamidol      . mouth rinse  15 mL Mouth Rinse BID  . pantoprazole (PROTONIX) IV  40 mg Intravenous Q12H   Continuous Infusions: . dextrose 5 % and 0.45% NaCl 50 mL/hr at 06/24/17 1819  . feeding supplement (VITAL 1.5 CAL) 60 mL/hr at 06/24/17 1800  . potassium chloride     PRN Meds:.diphenhydrAMINE **OR** diphenhydrAMINE, guaiFENesin-dextromethorphan, loperamide, naloxone **AND** sodium chloride flush, ondansetron (ZOFRAN) IV, potassium chloride, sodium chloride flush  General appearance: alert, cooperative and no distress Heart: regular rate and rhythm Lungs: clear to auscultation  bilaterally Abdomen: soft, non-tender; bowel sounds normal; no masses,  no organomegaly Extremities: extremities normal, atraumatic, no cyanosis or edema Wound: Abd wound clean and dry, neck wound clean and dry, Penrose with yellow drainage  Lab Results: CBC: Recent Labs    06/23/17 0204 06/25/17 0302  WBC 4.3 5.7  HGB 11.2* 11.5*  HCT 34.6* 35.5*  PLT 185 224   BMET:  Recent Labs    06/23/17 0204 06/25/17 0302  NA 136 137  K 3.5 3.7  CL 104 104  CO2 25 24  GLUCOSE 115* 124*  BUN 7 8  CREATININE 0.73 0.75  CALCIUM 7.9* 8.2*    CMET: Lab Results  Component Value Date   WBC 5.7 06/25/2017   HGB 11.5 (L) 06/25/2017   HCT 35.5 (L) 06/25/2017   PLT 224 06/25/2017   GLUCOSE 124 (H) 06/25/2017   TRIG 92 12/11/2016   ALT 25 06/14/2017   AST 16 06/14/2017   NA 137 06/25/2017   K 3.7 06/25/2017   CL 104 06/25/2017   CREATININE 0.75 06/25/2017   BUN 8 06/25/2017   CO2 24 06/25/2017   INR 1.00 06/14/2017      PT/INR: No results for input(s): LABPROT, INR in the last 72 hours. Radiology: No results found.   Assessment/Plan: S/P Procedure(s) (LRB): cervical ESOPHAGECTOMY COMPLETE (N/A) VIDEO BRONCHOSCOPY (N/A) PYLOROMYOTOMY (  N/A)  1. S/p Esophagectomy- for swallow study today 2. CV- hemodynamically stable in NSR 3. Pulm- off oxygen, persistent atelectasis on CXR,  CT output slowing down, serous now, will wait to see swallow study results prior to removing 4. GI- diarrhea resolved with immodium, tube feeds at goal, tolerating without issues 5. GU- urinalysis remains pending 6. Dispo- patient stable, labs stable, for gastrograffin study today, will review once complete, continue current care     Ellwood Handler 06/25/2017 7:32 AM   Looks good with min penrose, chest tube drainage If swallow study ok start cl liq diet  patient examined and medical record reviewed,agree with above note. Tharon Aquas Trigt III 06/25/2017

## 2017-06-26 ENCOUNTER — Inpatient Hospital Stay (HOSPITAL_COMMUNITY): Payer: Medicare Other

## 2017-06-26 LAB — COMPREHENSIVE METABOLIC PANEL
ALT: 22 U/L (ref 17–63)
AST: 12 U/L — ABNORMAL LOW (ref 15–41)
Albumin: 2.8 g/dL — ABNORMAL LOW (ref 3.5–5.0)
Alkaline Phosphatase: 132 U/L — ABNORMAL HIGH (ref 38–126)
Anion gap: 8 (ref 5–15)
BUN: 14 mg/dL (ref 6–20)
CO2: 27 mmol/L (ref 22–32)
Calcium: 8.4 mg/dL — ABNORMAL LOW (ref 8.9–10.3)
Chloride: 102 mmol/L (ref 101–111)
Creatinine, Ser: 0.81 mg/dL (ref 0.61–1.24)
GFR calc Af Amer: >60 mL/min (ref >60)
GFR calc non Af Amer: >60 mL/min (ref >60)
Glucose, Bld: 80 mg/dL (ref 65–99)
Potassium: 3.9 mmol/L (ref 3.5–5.1)
Sodium: 137 mmol/L (ref 135–145)
Total Bilirubin: 0.6 mg/dL (ref 0.3–1.2)
Total Protein: 5.9 g/dL — ABNORMAL LOW (ref 6.5–8.1)

## 2017-06-26 LAB — CBC
HCT: 38 % — ABNORMAL LOW (ref 39.0–52.0)
Hemoglobin: 12.2 g/dL — ABNORMAL LOW (ref 13.0–17.0)
MCH: 27.1 pg (ref 26.0–34.0)
MCHC: 32.1 g/dL (ref 30.0–36.0)
MCV: 84.4 fL (ref 78.0–100.0)
Platelets: 251 10*3/uL (ref 150–400)
RBC: 4.5 MIL/uL (ref 4.22–5.81)
RDW: 15.3 % (ref 11.5–15.5)
WBC: 8 10*3/uL (ref 4.0–10.5)

## 2017-06-26 LAB — GLUCOSE, CAPILLARY
Glucose-Capillary: 106 mg/dL — ABNORMAL HIGH (ref 65–99)
Glucose-Capillary: 87 mg/dL (ref 65–99)
Glucose-Capillary: 111 mg/dL — ABNORMAL HIGH (ref 65–99)
Glucose-Capillary: 104 mg/dL — ABNORMAL HIGH (ref 65–99)
Glucose-Capillary: 116 mg/dL — ABNORMAL HIGH (ref 65–99)
Glucose-Capillary: 92 mg/dL (ref 65–99)

## 2017-06-26 IMAGING — DX DG CHEST 2V
2 series · 2 of 2 positions shown · non-contrast
Comparison: [DATE]

CLINICAL DATA: Followup left-sided chest tube.

EXAM:
CHEST - 2 VIEW

[chest pa]
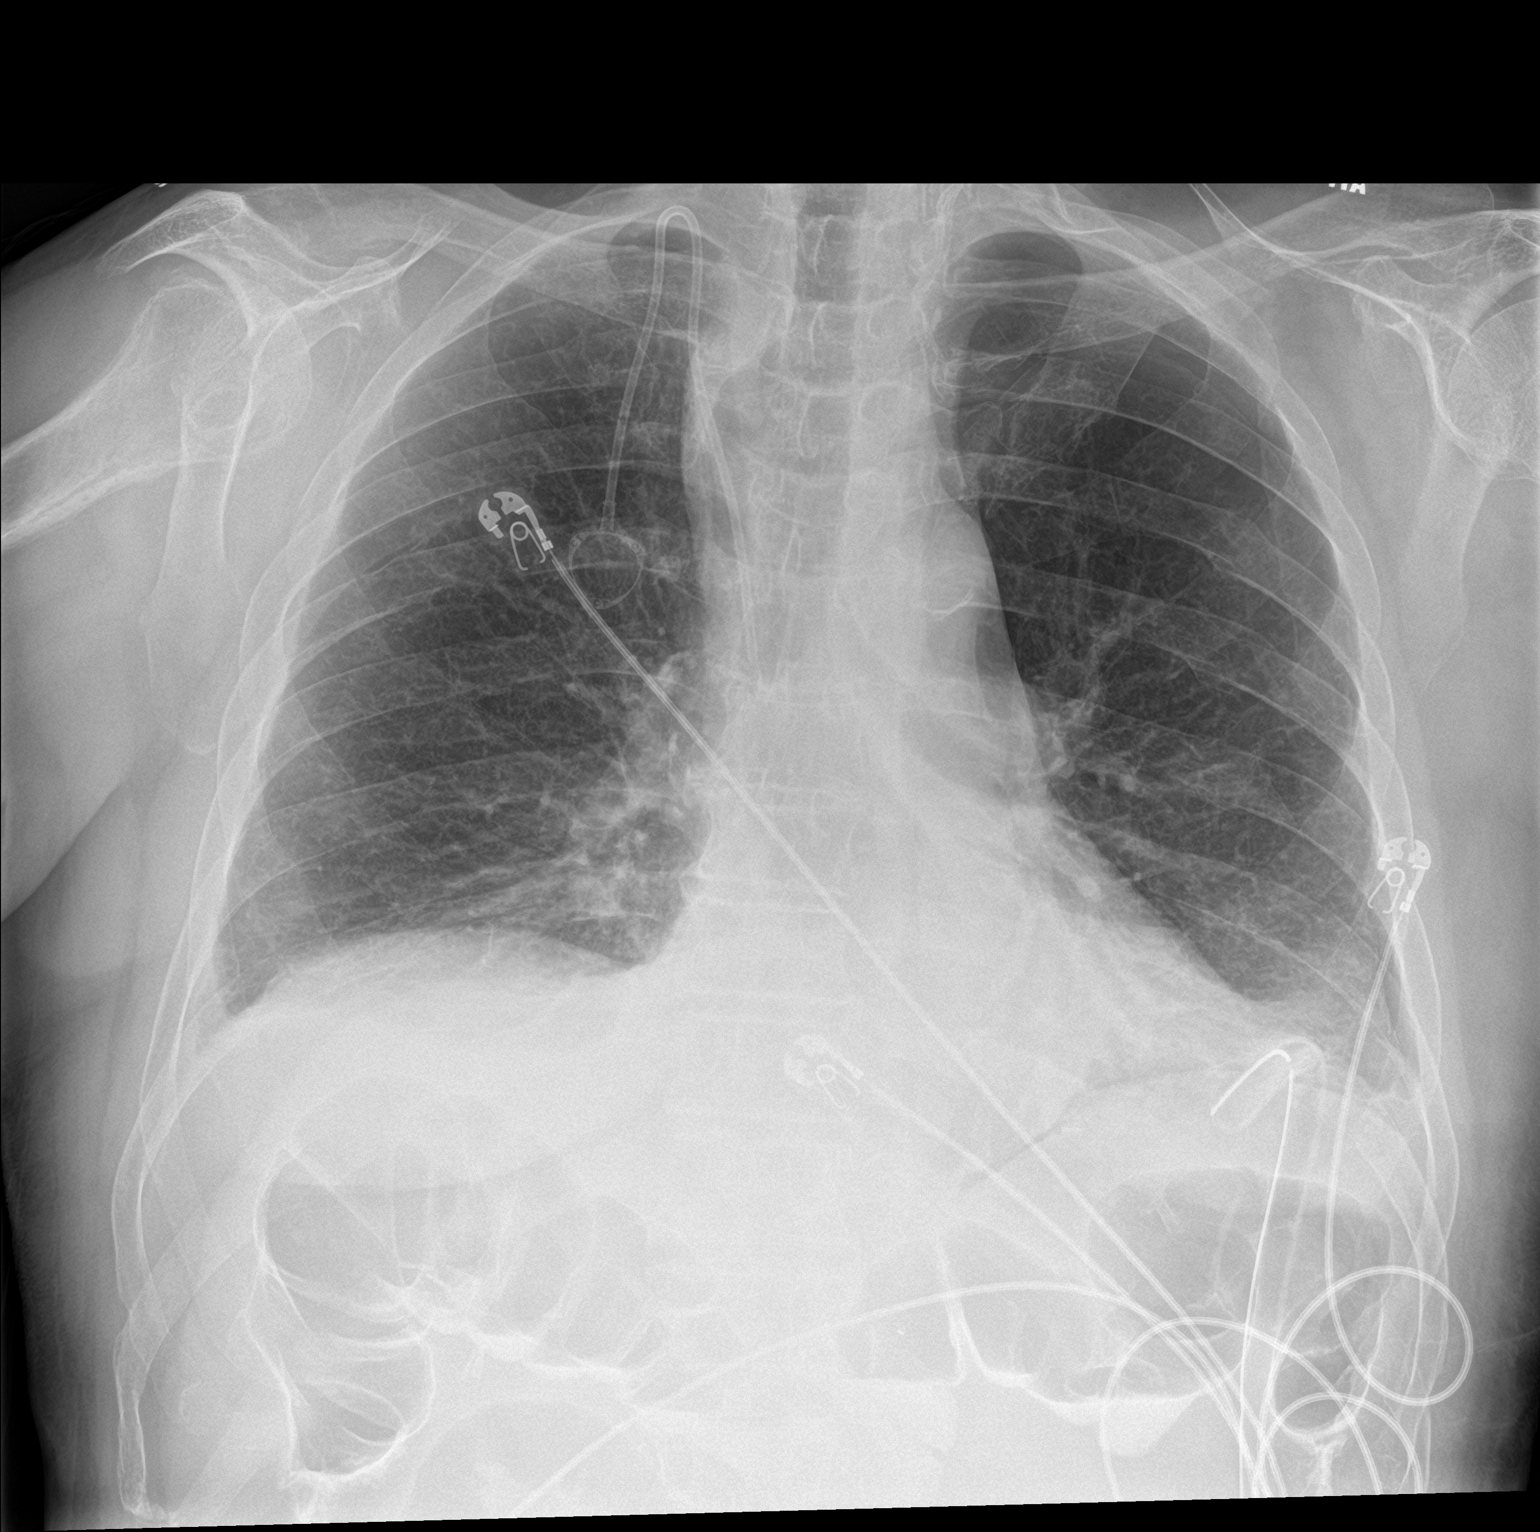

[chest lat]
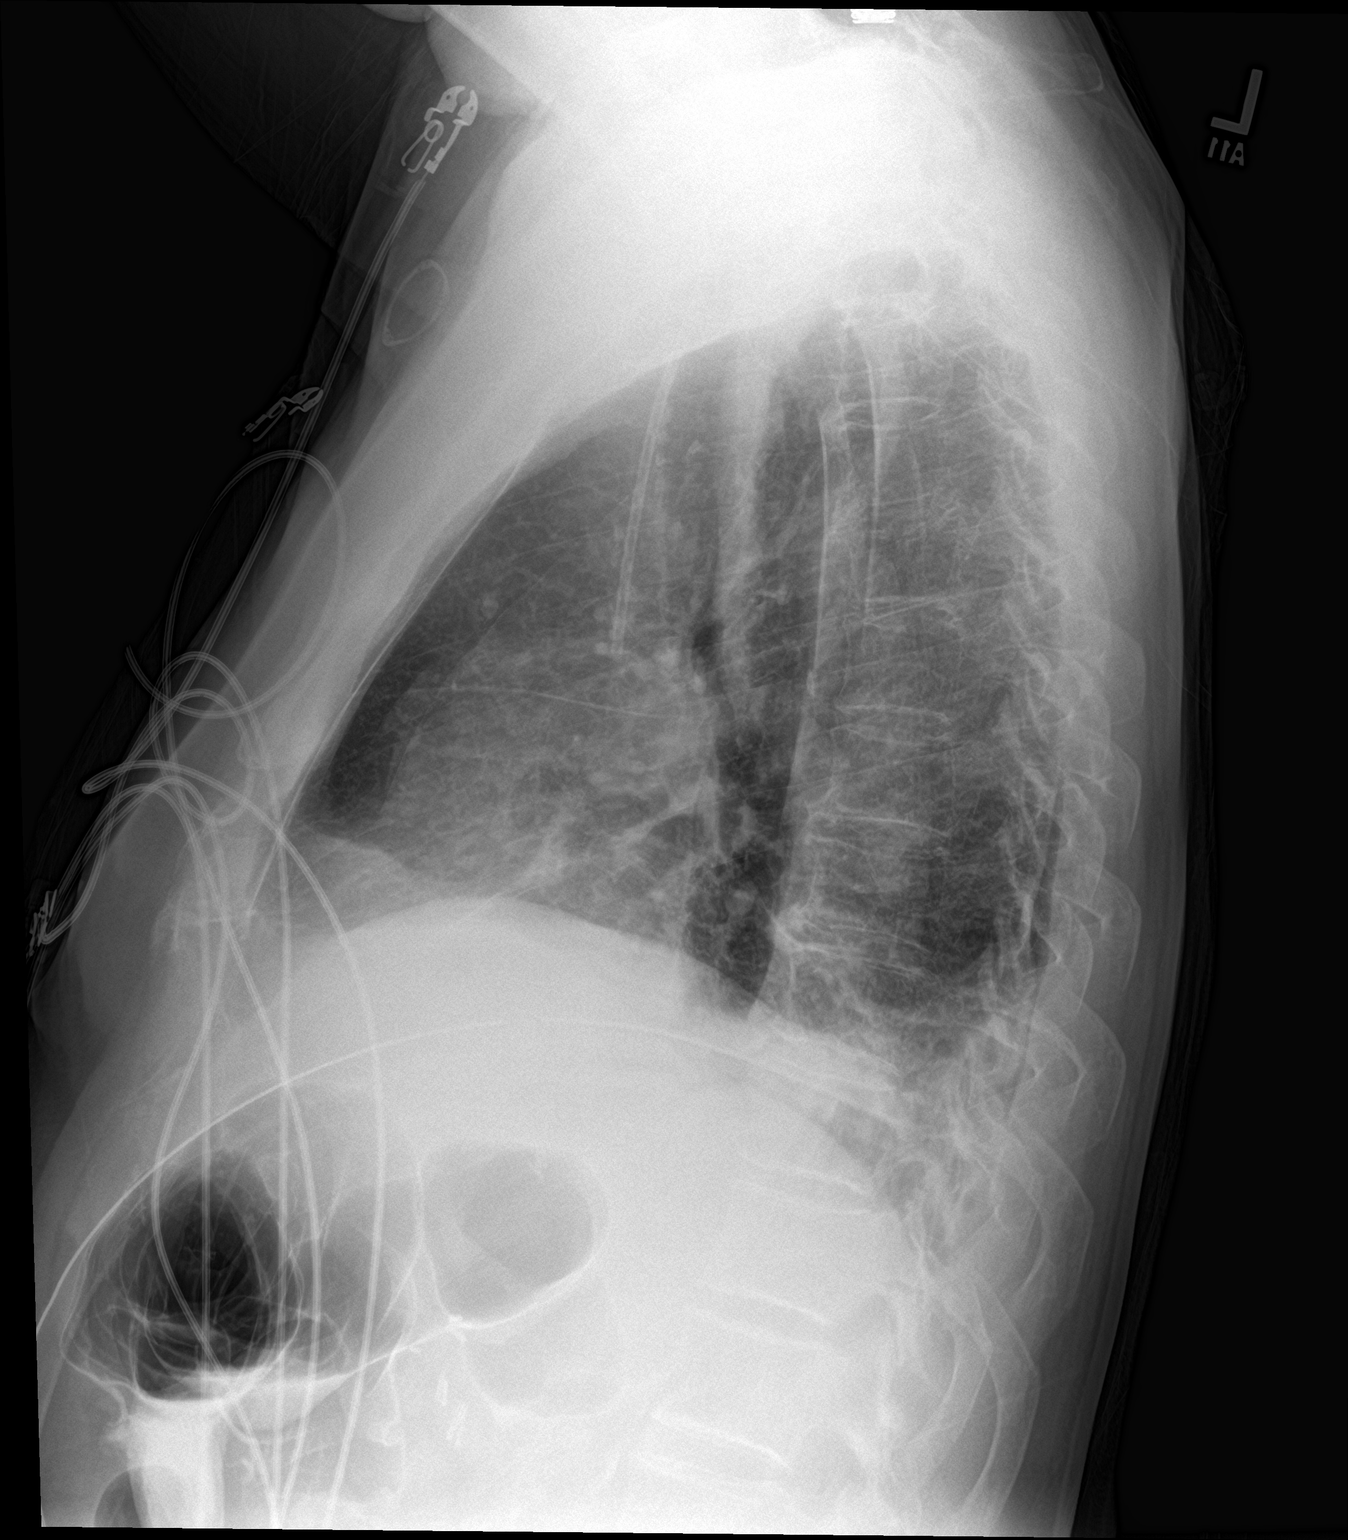

[2 of 2 positions shown; findings below may reference images not displayed]

FINDINGS: Left chest tube remains in place at the left base. There is been
enlargement of the left pneumothorax, now approximately 5-10%.
Atelectasis in the left lower lobe persists. Right chest shows some
atelectasis in the lower lung. Power port on the right is unchanged.
IMPRESSION: Enlargement of left pneumothorax, now 5-10%.

## 2017-06-26 MED ORDER — FUROSEMIDE 10 MG/ML IJ SOLN
20.0000 mg | Freq: Once | INTRAMUSCULAR | Status: AC
  Administered 2017-06-26: 20 mg via INTRAVENOUS
  Filled 2017-06-26: qty 2

## 2017-06-26 MED ORDER — POTASSIUM CHLORIDE 10 MEQ/100ML IV SOLN
10.0000 meq | INTRAVENOUS | Status: AC
  Administered 2017-06-26 (×2): 10 meq via INTRAVENOUS
  Filled 2017-06-26 (×2): qty 100

## 2017-06-26 MED ORDER — ENSURE ENLIVE PO LIQD
237.0000 mL | Freq: Three times a day (TID) | ORAL | Status: DC
  Administered 2017-06-26 – 2017-06-28 (×6): 237 mL via ORAL

## 2017-06-26 MED ORDER — OXYCODONE HCL 5 MG/5ML PO SOLN
5.0000 mg | ORAL | Status: DC | PRN
  Administered 2017-06-29: 10 mg via ORAL
  Filled 2017-06-26: qty 10

## 2017-06-26 MED ORDER — POTASSIUM CHLORIDE 10 MEQ/50ML IV SOLN
10.0000 meq | INTRAVENOUS | Status: DC
Start: 2017-06-26 — End: 2017-06-26

## 2017-06-26 MED ORDER — METOCLOPRAMIDE HCL 5 MG/ML IJ SOLN
10.0000 mg | Freq: Four times a day (QID) | INTRAMUSCULAR | Status: AC
  Administered 2017-06-26 – 2017-06-27 (×4): 10 mg via INTRAVENOUS
  Filled 2017-06-26 (×4): qty 2

## 2017-06-26 NOTE — Progress Notes (Signed)
Report called to receiving RN, all patients belongings gathered and transferred with patient.  Patient family at bedside and aware of transfer.

## 2017-06-26 NOTE — Progress Notes (Signed)
Nutrition Follow-up  INTERVENTION:   - Continue Vital 1.5 @ 60 ml/hr (1440 ml/day)  TF provides 2160 kcal, 97 grams protein, and 1100 ml free water  - Continue to monitor for diet advancement  - Per MD, Ensure Enlive po TID, each supplement provides 350 kcal and 20 grams of protein  NUTRITION DIAGNOSIS:   Increased nutrient needs related to cancer and cancer related treatments as evidenced by estimated needs.  Ongoing  GOAL:   Patient will meet greater than or equal to 90% of their needs  Met with TF  MONITOR:   Diet advancement, TF tolerance  REASON FOR ASSESSMENT:   Consult Enteral/tube feeding initiation and management  ASSESSMENT:   Pt with PMH of esophageal cancer dx summer 2018. Pt started on TPN 9/18 as inpatient at [facility] as pt met criteria for severe malnutrition. Pt had J-tube placed and started on TF 01/2017. Pt followed by RD at cancer center.   06/18/17 - complete esophagectomy 06/20/17 - start trickle TF via J-tube 06/24/17 - TF advanced to goal of 60 ml/hr 06/25/17 - esophagram/barium swallow, diet advanced to clear liquids  Spoke with pt who reports he is tolerating TF well. Pt states that he occasionally has diarrhea but that it resolves with Imodium. Pt was advanced to CLD and had broth, jello, and juice today. Per MD note, pt has requested Ensure Enlive TID. Pt receiving Ensure Enlive from RN at time of visit. Will continue to monitor for diet advancement and adjust TF as appropriate.  Updated [place] RD.  I/O's: +7.5 L since admission UOP: 800 ml x 24 hours CT output: 450 ml yesterday  Medications reviewed and include: sliding scale Novolog, 10 mg Reglan QID, D5 @ 10 ml/hr, 20 mg IV Pepcid BID, potassium chloride as needed  Labs reviewed: hemoglobin 12.2 (L), HCT 38.0 (L) CBG's: 104, 111, 87, 122, 106 x 24 hours  Home TF regimen: - Vital 1.5, 4.5 cartons per day via continuous pump - Water flush of 136m 4 times per day with increase in oral  water intake of 2, 8 oz cups per day - TF ran over 16 hrs - Continue to drink 2 ensure plus per day for additional calories and free water.   Diet Order:  Diet clear liquid Room service appropriate? Yes; Fluid consistency: Thin; Fluid restriction: 1200 mL Fluid  EDUCATION NEEDS:   No education needs have been identified at this time  Skin:  Skin Assessment: Reviewed RN Assessment(incisions)  Last BM:  06/26/17 small type 7  Height:   Ht Readings from Last 1 Encounters:  06/21/17 _0  (1.753 m)    Weight:   Wt Readings from Last 1 Encounters:  06/26/17 174 lb 9.7 oz (79.2 kg)    Ideal Body Weight:  72.7 kg  BMI:  Body mass index is 25.78 kg/m.  Estimated Nutritional Needs:   Kcal:  2000-2300  Protein:  97-112 grams  Fluid:  > 2L/day    KGaynell Face MS, RD, LDN Pager: [phone]/After Hours: [phone]

## 2017-06-26 NOTE — Progress Notes (Addendum)
TCTS DAILY ICU PROGRESS NOTE                   [address]            [place] [address]          [phone]   8 Days Post-Op Procedure(s) (LRB): cervical ESOPHAGECTOMY COMPLETE (N/A) VIDEO BRONCHOSCOPY (N/A) PYLOROMYOTOMY (N/A)  Total Length of Stay:  LOS: 8 days   Subjective:  Doing okay.  Some mild nausea this morning, which resolved on its own  Objective: Vital signs in last 24 hours: Temp:  [97.9 F (36.6 C)-98.2 F (36.8 C)] 98.1 F (36.7 C) (04/16 0339) Pulse Rate:  [80-104] 98 (04/16 0340) Cardiac Rhythm: Normal sinus rhythm (04/15 2000) Resp:  [13-21] 17 (04/16 0400) BP: (112-128)/(62-80) 113/62 (04/16 0340) SpO2:  [95 %-98 %] 97 % (04/16 0400) FiO2 (%):  [95 %] 95 % (04/15 1952) Weight:  [174 lb 9.7 oz (79.2 kg)] 174 lb 9.7 oz (79.2 kg) (04/16 0500)  Filed Weights   06/24/17 0600 06/25/17 0600 06/26/17 0500  Weight: 184 lb 1.4 oz (83.5 kg) 181 lb 4.8 oz (82.2 kg) 174 lb 9.7 oz (79.2 kg)    Weight change: -6 lb 11.1 oz (-3.037 kg)   Intake/Output from previous day: 04/15 0701 - 04/16 0700 In: 4650 [I.V.:220; NG/GT:1380; IV Piggyback:50] Out: 1020 [Urine:800; Chest Tube:220]  Current Meds: Scheduled Meds: . enoxaparin (LOVENOX) injection  40 mg Subcutaneous Q24H  . fentaNYL   Intravenous Q4H  . insulin aspart  0-24 Units Subcutaneous Q4H  . mouth rinse  15 mL Mouth Rinse BID  . metoCLOPramide (REGLAN) injection  10 mg Intravenous Q6H   Continuous Infusions: . dextrose 5 % and 0.45% NaCl 10 mL/hr at 06/25/17 1039  . famotidine (PEPCID) IV Stopped (06/25/17 2253)  . feeding supplement (VITAL 1.5 CAL) 60 mL/hr at 06/25/17 1000  . potassium chloride     PRN Meds:.diphenhydrAMINE **OR** diphenhydrAMINE, guaiFENesin-dextromethorphan, iopamidol, loperamide, naloxone **AND** sodium chloride flush, ondansetron (ZOFRAN) IV, oxyCODONE, potassium chloride, sodium chloride flush    Lab Results: CBC: Recent Labs    06/25/17 0302  06/26/17 0306  WBC 5.7 8.0  HGB 11.5* 12.2*  HCT 35.5* 38.0*  PLT 224 251   BMET:  Recent Labs    06/25/17 0302 06/26/17 0306  NA 137 137  K 3.7 3.9  CL 104 102  CO2 24 27  GLUCOSE 124* 80  BUN 8 14  CREATININE 0.75 0.81  CALCIUM 8.2* 8.4*    CMET: Lab Results  Component Value Date   WBC 8.0 06/26/2017   HGB 12.2 (L) 06/26/2017   HCT 38.0 (L) 06/26/2017   PLT 251 06/26/2017   GLUCOSE 80 06/26/2017   TRIG 92 12/11/2016   ALT 22 06/26/2017   AST 12 (L) 06/26/2017   NA 137 06/26/2017   K 3.9 06/26/2017   CL 102 06/26/2017   CREATININE 0.81 06/26/2017   BUN 14 06/26/2017   CO2 27 06/26/2017   INR 1.00 06/14/2017      PT/INR: No results for input(s): LABPROT, INR in the last 72 hours. Radiology: Dg Esophagus W/water Sol Cm  Result Date: 06/25/2017 CLINICAL DATA:  Esophageal carcinoma. Status post complete esophagectomy with cervical esophagogastrostomy and pyloromyotomy. EXAM: ESOPHOGRAM/BARIUM SWALLOW TECHNIQUE: Single contrast examination was performed using water-soluble contrast. FLUOROSCOPY TIME:  Fluoroscopy Time:  2 minutes 0 seconds COMPARISON:  CT scan of the chest dated 06/11/2017 FINDINGS: The water-soluble esophagram demonstrates no evidence of leakage  at the cervical anastomosis. Contrast flows freely into the duodenum and proximal small bowel. The patient did have 2 episodes of symptomatic aspiration of water soluble contrast. However, the patient took large sips of contrast at each of these times and with smaller sips there was no evidence of laryngeal penetration or aspiration. IMPRESSION: 1. No evidence of leak after cervical esophagogastroscopy. 2. Two episodes of symptomatic aspiration of water soluble contrast with large sips. No aspiration with smaller sips. Electronically Signed   By: Lorriane Shire M.D.   On: 06/25/2017 13:44     Assessment/Plan: S/P Procedure(s) (LRB): cervical ESOPHAGECTOMY COMPLETE (N/A) VIDEO BRONCHOSCOPY (N/A) PYLOROMYOTOMY  (N/A)  1. S/P Esophagectomy- swallow study yesterday free from leak, continue clear liquid diet.. Patient instructed to take it slow with small sips as he showed evidence of aspiration during swallow yesterday with large intake 2. Chest tube- 450 cc serous output yesterday, will leave in place today 3. Penrose- minimal yellow drainage, will watch if remains low can possibly start to advance tomorrow 4. GI- tube feedings at goal, no further diarrhea with use of immodium, will start reglan to help keep bowels moving and will help with nausea 5. CV- hemodynamically stable 6. Dispo- will try to stop PCA today, ordered liquid pain medication, keep chest tubes in place with 450 cc output yesterday, may be able to start advancing neck penrose drain tomorrow if output remains low, patient is medically stable, will transfer to [place] 06/26/2017 7:35 AM   Patient examined and gatrograffin swallow images reviewed. Being seen by SLT- ok to start cl liq with s\mall sips, pt wants ensure Leave chest tube today Ok to tx to 2 C  patient examined and medical record reviewed,agree with above note. Tharon Aquas Trigt III 06/26/2017

## 2017-06-26 NOTE — Discharge Summary (Signed)
Physician Discharge Summary  Patient ID: Casey Acevedo MRN: [id number] DOB/AGE: [birth year] 77 y.o.  Admit date: 06/18/2017 Discharge date: 06/29/2017  Admission Diagnoses:  Patient Active Problem List   Diagnosis Date Noted  . Coronary artery calcification 05/18/2017  . Cellulitis of abdominal wall   . Sepsis ([place]) 02/25/2017  . Jejunostomy site infection ([place]) 02/25/2017  . Dehydration 02/20/2017  . Jejunostomy tube site pain ([place]) 01/29/2017  . S/P jejunostomy ([place]) 01/16/2017  . 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 Preop cardiovascular exam 01/04/2017  . Protein-calorie malnutrition, severe 12/07/2016  . 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 Dysphagia 12/06/2016  . GERD (gastroesophageal reflux disease) 06/01/2016   Discharge Diagnoses:   Patient Active Problem List   Diagnosis Date Noted  . H/O esophagectomy 06/18/2017  . Coronary artery calcification 05/18/2017  . Cellulitis of abdominal wall   . Sepsis ([place]) 02/25/2017  . Jejunostomy site infection ([place]) 02/25/2017  . Dehydration 02/20/2017  . Jejunostomy tube site pain ([place]) 01/29/2017  . S/P jejunostomy ([place]) 01/16/2017  . 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 Preop cardiovascular exam 01/04/2017  . Protein-calorie malnutrition, severe 12/07/2016  . 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 Dysphagia 12/06/2016  . GERD (gastroesophageal reflux disease) 06/01/2016   Discharged Condition: good  History of Present Illness:  Casey Acevedo is 77 yo white male with history of Esophageal Cancer.  He developed complaints of dysphagia in later summer 2018.  He underwent Endoscopy at the end of September with negative biopsy for malignancy.  Repeat Endoscopy was performed in October 2018 with dilatation.  He developed severe chest pain post procedure.  Repeat scan showed no signs of perforation.  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  He  was treated with Chemotherapy and radiation which completed in December of this year.  The patient was unable to to tolerate much oral intake.  He was obtaining most nutrition from a feeding jejunostomy tube.  Once patient has recovered from treatment and has gained some weight back.  Repeat PET Scan scan performed in February which was negative for evidence of metastasis.  Repeat CT scan of the chest and abdomen which did not show evidence of recurrent esophageal carcinoma.  It was felt the patient would now be able to tolerate an Esophagectomy.  The risks and benefits of the procedure were explained to the patient and he was agreeable to proceed.  Hospital Course:   Casey Acevedo presented to [facility] on 06/18/2017.  He was taken to the operating room and underwent Video Bronchoscopy, transhiatal total esophagectomy with cervical esophagogastrostomy and pylorotomy.  He tolerated the procedure without difficulty and was taken to the SICU in stable condition.  He was extubated the evening of surgery.  His arterial line was removed on POD #1.  His NG tube output decreased and his NG tube was removed on POD #2.  He was started on trickle tube feedings on POD #3, which have been increased to a goal of 60 ml/hr.  He has tolerated the increase without difficulty.  Gastrograffin swallow study was performed on 06/25/2017 and showed no evidence of anastomotic leak.  He was started on a clear liquid diet which will be advanced as tolerated.  He is working with SLP as he showed evidence of aspiration during his swallow study when he intook large amounts of contrast.  He is ambulating around the SICU and was medically stable for transfer to the  telemetry unit on 06/26/2017.  He continues to make progress.  His chest tube output decreased and his chest tubes were removed on 06/27/2017.  His penrose drain in his neck was advanced and ultimately removed on 06/29/2017.  His diet has been advanced to a Bariatric soft diet.   He remains on tube feeds on a nightly basis.  He is ambulating without difficulty.  He has had some issues with diarrhea throughout his stay.  He is paying attention and notices that is occurs after ingestion of Ensure.  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  He is medically stable for discharge home today.  Significant Diagnostic Studies: Barium Swallow  1. No evidence of leak after cervical esophagogastroscopy. 2. Two episodes of symptomatic aspiration of water soluble contrast with large sips. No aspiration with smaller sips.  Pathology: 1. Lymph node, biopsy, omental - FIBROADIPOSE TISSUE - NO MALIGNANCY IDENTIFIED 2. 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  Discharge Exam: Blood pressure 119/76, pulse 88, temperature 97.9 F (36.6 C), temperature source Oral, resp. rate 17, height 5\' 9"  (1.753 m), weight 177 lb (80.3 kg), SpO2 98 %.   General appearance: alert, cooperative and no distress Resp: clear to auscultation bilaterally Cardio: regular rate and rhythm Extremities: extremities normal, atraumatic, no cyanosis or edema Neurologic: Grossly normal   Disposition: Discharge disposition: 01-Home or Self Care  Discharge Medications:   Allergies as of 06/29/2017   No Known Allergies     Medication List    STOP taking these medications   feeding supplement (PRO-STAT SUGAR FREE 64) Liqd     TAKE these medications   feeding supplement (VITAL 1.5 CAL) Liqd Give vital 1.5 via  continuous pump at 55ml/hr for 22 hours.  Flush with 27ml of water before starting tube feeding and after stopping tube feeding.  Provide additional 239ml of water 2 other times during waking hours via J tube. What changed:    how much to take  how to take this  when to take this  additional instructions   hydrocortisone cream-nystatin cream-zinc oxide Apply 1 application 4 (four) times daily topically.   loperamide 2 MG capsule Commonly known as:  IMODIUM Take 1 capsule (2 mg total) by mouth as needed for diarrhea or loose stools (take two tablests after the first loose stool, then 1 tablet after each loose stool).   oxyCODONE 5 MG/5ML solution Commonly known as:  ROXICODONE Take 5 mLs (5 mg total) by mouth every 4 (four) hours as needed for severe pain.      Follow-up Information    Grace Isaac, MD Follow up on 07/19/2017.   Specialty:  Cardiothoracic Surgery Why:  Appointment is at 2:30 Contact information: [address] [place] [address] [phone]           Signed: Ellwood Handler 06/29/2017, 9:32 AM

## 2017-06-26 NOTE — Evaluation (Signed)
Clinical/Bedside Swallow Evaluation Patient Details  Name: Casey Acevedo MRN: [id number] Date of Birth: 09/06/1940  Today's Date: 06/26/2017 Time: SLP Start Time (ACUTE ONLY): 0754 SLP Stop Time (ACUTE ONLY): 0815 SLP Time Calculation (min) (ACUTE ONLY): 21 min  Past Medical History:  Past Medical History:  Diagnosis Date  . Cancer ([place])    esophageal  . Dysphagia    Past Surgical History:  Past Surgical History:  Procedure Laterality Date  . CHOLECYSTECTOMY    . COMPLETE ESOPHAGECTOMY N/A 06/18/2017   Procedure: cervical ESOPHAGECTOMY COMPLETE;  Surgeon: Grace Isaac, MD;  Location[facility] OR;  Service: Thoracic;  Laterality: N/A;  NEED NIMS ET TUBE  . ESOPHAGOGASTRODUODENOSCOPY (EGD) WITH PROPOFOL N/A 12/07/2016   Procedure: ESOPHAGOGASTRODUODENOSCOPY (EGD) WITH PROPOFOL;  Surgeon: Jonathon Bellows, MD;  Location: [facility] ENDOSCOPY;  Service: Gastroenterology;  Laterality: N/A;  . ESOPHAGOGASTRODUODENOSCOPY (EGD) WITH PROPOFOL N/A 12/26/2016   Procedure: ESOPHAGOGASTRODUODENOSCOPY (EGD) WITH PROPOFOL WITH DILATION;  Surgeon: Jonathon Bellows, MD;  Location: [facility] ENDOSCOPY;  Service: Gastroenterology;  Laterality: N/A;  . EYE SURGERY    . GASTROJEJUNOSTOMY N/A 01/16/2017   Procedure: LAPROSCOPIC ASSIST FEEDING JEJUNOSTOMY TUBE;  Surgeon: Johnathan Hausen, MD;  Location: [facility] ORS;  Service: General;  Laterality: N/A;  . IR FLUORO GUIDE PORT INSERTION RIGHT  01/10/2017  . IR REPLACE G-TUBE SIMPLE WO FLUORO  03/15/2017  . IR REPLC DUODEN/JEJUNO TUBE PERCUT W/FLUORO  03/05/2017  . PICC LINE INSERTION Right   . PYLOROMYOTOMY N/A 06/18/2017   Procedure: Edwena Blow;  Surgeon: Grace Isaac, MD;  Location: [facility] OR;  Service: Thoracic;  Laterality: N/A;  . VIDEO BRONCHOSCOPY N/A 06/18/2017   Procedure: VIDEO BRONCHOSCOPY;  Surgeon: Grace Isaac, MD;  Location: [facility] OR;  Service: Thoracic;  Laterality: N/A;   HPI:  Casey Acevedo y.o.admitted with advanced stage esophageal cancer. Pt underwent  chemo and completed radiation March 05 2017. On 06/18/08 pt underwent    Assessment / Plan / Recommendation Clinical Impression  Oral motor exam normal. Pt mildly reclined in chair and did not want to sit in more upright position due to pain it places on abdomen. No oral or pharyngeal impairments exhibited; minimal throat clear x1. Pt educated on aspiration precautions and importance of precautions such as small sips, intermittent throat clears, smaller and more frequent meals. Overall his awareness appears decreased re: significance of his surgery. Initially denying that he aspirated during yesterday's esophagram. When educating re: strategies he is polite and respectful but down plays the situation and stated "people are making a much bigger deal with this than it really is." Explained clinical reasoning for recommendations. Recommend continue thin (clear liquids), all precautions aforementioned and ST will plan to check on him Thursday.   SLP Visit Diagnosis: Dysphagia, unspecified (R13.10)    Aspiration Risk  Moderate aspiration risk;Severe aspiration risk    Diet Recommendation Thin liquid(clears per MD)   Liquid Administration via: No straw;Cup Medication Administration: Via alternative means(IV) Supervision: Patient able to self feed;Intermittent supervision to cue for compensatory strategies Compensations: Slow rate;Small sips/bites;Clear throat intermittently Postural Changes: Seated upright at 90 degrees;Remain upright for at least 30 minutes after po intake    Other  Recommendations Oral Care Recommendations: Oral care BID   Follow up Recommendations Other (comment)(TBD)      Frequency and Duration min 2x/week  2 weeks       Prognosis Prognosis for Safe Diet Advancement: Good      Swallow Study   General HPI: Casey Acevedo y.o.admitted with advanced  stage esophageal cancer. Pt underwent chemo and completed radiation March 05 2017. On 06/18/08 pt underwent  Type of  Study: Bedside Swallow Evaluation Previous Swallow Assessment: (none) Diet Prior to this Study: Thin liquids(clear liquid) Temperature Spikes Noted: No Respiratory Status: Room air History of Recent Intubation: Yes Length of Intubations (days): (during surgery 4/8) Date extubated: 06/18/17 Behavior/Cognition: Alert;Cooperative;Requires cueing Oral Cavity Assessment: Within Functional Limits Oral Care Completed by SLP: No Oral Cavity - Dentition: Adequate natural dentition Vision: Functional for self-feeding Self-Feeding Abilities: Able to feed self Patient Positioning: (mildly reclined in chair) Baseline Vocal Quality: Normal Volitional Cough: Strong Volitional Swallow: Able to elicit    Oral/Motor/Sensory Function Overall Oral Motor/Sensory Function: Within functional limits   Ice Chips Ice chips: Not tested   Thin Liquid Thin Liquid: Within functional limits Presentation: Cup    Nectar Thick Nectar Thick Liquid: Not tested   Honey Thick Honey Thick Liquid: Not tested   Puree Puree: Not tested   Solid   GO   Solid: Not tested        Houston Siren 06/26/2017,8:40 AM  Orbie Pyo Colvin Caroli.Ed [employer] [phone]

## 2017-06-27 ENCOUNTER — Encounter: Payer: Self-pay | Admitting: Oncology

## 2017-06-27 ENCOUNTER — Inpatient Hospital Stay (HOSPITAL_COMMUNITY): Payer: Medicare Other

## 2017-06-27 LAB — CBC
HCT: 37.1 % — ABNORMAL LOW (ref 39.0–52.0)
Hemoglobin: 12 g/dL — ABNORMAL LOW (ref 13.0–17.0)
MCH: 27.3 pg (ref 26.0–34.0)
MCHC: 32.3 g/dL (ref 30.0–36.0)
MCV: 84.3 fL (ref 78.0–100.0)
Platelets: 272 10*3/uL (ref 150–400)
RBC: 4.4 MIL/uL (ref 4.22–5.81)
RDW: 15 % (ref 11.5–15.5)
WBC: 7.4 10*3/uL (ref 4.0–10.5)

## 2017-06-27 LAB — BASIC METABOLIC PANEL
Anion gap: 8 (ref 5–15)
BUN: 14 mg/dL (ref 6–20)
CO2: 28 mmol/L (ref 22–32)
Calcium: 8.5 mg/dL — ABNORMAL LOW (ref 8.9–10.3)
Chloride: 102 mmol/L (ref 101–111)
Creatinine, Ser: 0.87 mg/dL (ref 0.61–1.24)
GFR calc Af Amer: >60 mL/min (ref >60)
GFR calc non Af Amer: >60 mL/min (ref >60)
Glucose, Bld: 108 mg/dL — ABNORMAL HIGH (ref 65–99)
Potassium: 4.3 mmol/L (ref 3.5–5.1)
Sodium: 138 mmol/L (ref 135–145)

## 2017-06-27 LAB — GLUCOSE, CAPILLARY
Glucose-Capillary: 116 mg/dL — ABNORMAL HIGH (ref 65–99)
Glucose-Capillary: 128 mg/dL — ABNORMAL HIGH (ref 65–99)
Glucose-Capillary: 101 mg/dL — ABNORMAL HIGH (ref 65–99)
Glucose-Capillary: 90 mg/dL (ref 65–99)
Glucose-Capillary: 96 mg/dL (ref 65–99)
Glucose-Capillary: 108 mg/dL — ABNORMAL HIGH (ref 65–99)
Glucose-Capillary: 166 mg/dL — ABNORMAL HIGH (ref 65–99)

## 2017-06-27 IMAGING — DX DG CHEST 1V PORT
1 series · 1 of 1 positions shown · non-contrast
Comparison: [DATE]

CLINICAL DATA: History of esophagectomy

EXAM:
PORTABLE CHEST 1 VIEW

[chest ap]
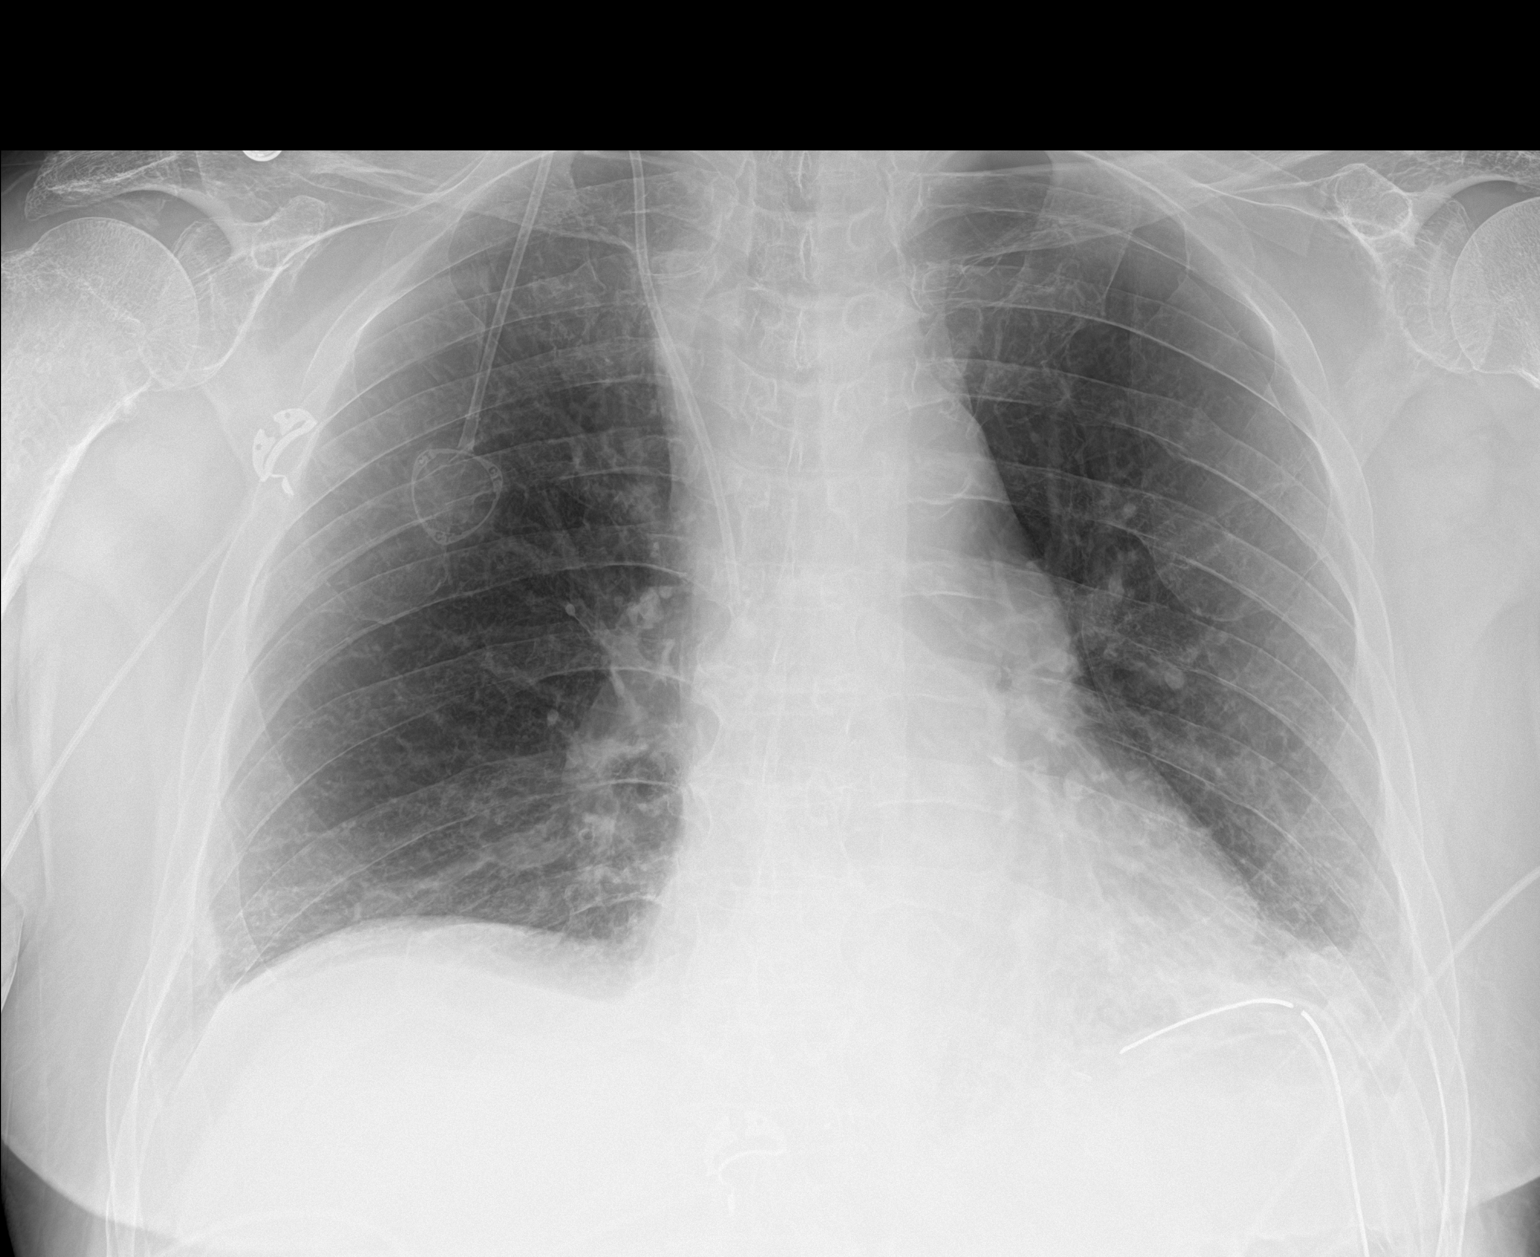

[1 of 1 positions shown; findings below may reference images not displayed]

FINDINGS: Right Port-A-Cath remains in place, unchanged. Left basilar chest
tube in place. Stable approximately 10% left pneumothorax. Bibasilar
atelectasis, left greater than right, stable. Heart is normal size.
IMPRESSION: Approximately 10% left pneumothorax, not significantly changed. Left
basilar chest tube in stable position.

Bibasilar atelectasis.

## 2017-06-27 MED ORDER — FAMOTIDINE 40 MG/5ML PO SUSR
20.0000 mg | Freq: Two times a day (BID) | ORAL | Status: DC
  Administered 2017-06-27 – 2017-06-29 (×4): 20 mg via ORAL
  Filled 2017-06-27 (×4): qty 2.5

## 2017-06-27 MED ORDER — FENTANYL 40 MCG/ML IV SOLN
INTRAVENOUS | Status: DC
  Administered 2017-06-27: 30 ug via INTRAVENOUS

## 2017-06-27 NOTE — Progress Notes (Signed)
Wasted fentanyl 20 ml and witness by [employer]. Removed chest tube and ambulated without complication. Pt had more than three times diarrhea. Pt wanted to talk Dr. Servando Snare regarding this in the evening, If he doesn't come today, will call PA for it. HS [employer]

## 2017-06-27 NOTE — Progress Notes (Signed)
Nutrition Follow-up  DOCUMENTATION CODES:   Not applicable  INTERVENTION:   - Continue Vital 1.5 @ 60 ml/hr via j-tube  TF provides 2160 kcal, 97 grams protein, and 1100 ml free water  - Continue to monitor for diet advancement  - Continue Ensure Enlive po TID, each supplement provides 350 kcal and 20 grams of protein  NUTRITION DIAGNOSIS:   Increased nutrient needs related to cancer and cancer related treatments as evidenced by estimated needs.  Ongoing  GOAL:   Patient will meet greater than or equal to 90% of their needs  Met with TF  MONITOR:   Diet advancement, TF tolerance  REASON FOR ASSESSMENT:   Consult Enteral/tube feeding initiation and management  ASSESSMENT:   Pt with PMH of esophageal cancer dx summer 2018. Pt started on TPN 9/18 as inpatient at [facility] as pt met criteria for severe malnutrition. Pt had J-tube placed and started on TF 01/2017. Pt followed by RD at cancer center.  06/18/17 - complete esophagectomy 06/20/17 - start trickle TF via J-tube 06/24/17 - TF advanced to goal of 60 ml/hr 06/25/17 - esophagram/barium swallow, diet advanced to clear liquids  Reviewed I/O's: -151 ml x 24 houts and +6.9 L since admission. No chest tube output over the past 24 hours per doc flowsheets.   Case discussed with RN; pt was advanced to full liquid diet this morning and tolerating well. He consumed a small amount of cream soup this morning and has been consuming his Ensure supplements. Pt is hopeful for continued diet advancement.   Vital 1.5 @ 60 ml/hr continues to infuse via j-tube.  TF provides 2160 kcal, 97 grams protein, and 1100 ml free water, meeting 100% of nutritional needs. Per discussion with RN, pt continues to tolerate TF well.  Labs reviewed: CBGS: 101-128 (inpatient orders for glycemic control are 0-24 units insulin aspart TID with meals).   Diet Order:  Diet bariatric full liquid Room service appropriate? Yes; Fluid consistency:  Thin  EDUCATION NEEDS:   No education needs have been identified at this time  Skin:  Skin Assessment: Skin Integrity Issues: Skin Integrity Issues:: Incisions Incisions: lt neck, lt chest  Last BM:  06/26/17  Height:   Ht Readings from Last 1 Encounters:  06/21/17 _0  (1.753 m)    Weight:   Wt Readings from Last 1 Encounters:  06/27/17 178 lb 5.6 oz (80.9 kg)    Ideal Body Weight:  72.7 kg  BMI:  Body mass index is 26.34 kg/m.  Estimated Nutritional Needs:   Kcal:  2000-2300  Protein:  97-112 grams  Fluid:  > 2L/day    Casey Acevedo A. Jimmye Norman, RD, LDN, CDE Pager: [phone] After hours Pager: [phone]

## 2017-06-28 ENCOUNTER — Inpatient Hospital Stay (HOSPITAL_COMMUNITY): Payer: Medicare Other

## 2017-06-28 ENCOUNTER — Encounter: Payer: Self-pay | Admitting: *Deleted

## 2017-06-28 LAB — CBC
HCT: 36 % — ABNORMAL LOW (ref 39.0–52.0)
Hemoglobin: 11.8 g/dL — ABNORMAL LOW (ref 13.0–17.0)
MCH: 27.6 pg (ref 26.0–34.0)
MCHC: 32.8 g/dL (ref 30.0–36.0)
MCV: 84.1 fL (ref 78.0–100.0)
Platelets: 310 10*3/uL (ref 150–400)
RBC: 4.28 MIL/uL (ref 4.22–5.81)
RDW: 15.1 % (ref 11.5–15.5)
WBC: 7.2 10*3/uL (ref 4.0–10.5)

## 2017-06-28 LAB — BASIC METABOLIC PANEL
Anion gap: 8 (ref 5–15)
BUN: 15 mg/dL (ref 6–20)
CO2: 25 mmol/L (ref 22–32)
Calcium: 8.3 mg/dL — ABNORMAL LOW (ref 8.9–10.3)
Chloride: 103 mmol/L (ref 101–111)
Creatinine, Ser: 0.79 mg/dL (ref 0.61–1.24)
GFR calc Af Amer: >60 mL/min (ref >60)
GFR calc non Af Amer: >60 mL/min (ref >60)
Glucose, Bld: 113 mg/dL — ABNORMAL HIGH (ref 65–99)
Potassium: 3.9 mmol/L (ref 3.5–5.1)
Sodium: 136 mmol/L (ref 135–145)

## 2017-06-28 LAB — GLUCOSE, CAPILLARY
Glucose-Capillary: 120 mg/dL — ABNORMAL HIGH (ref 65–99)
Glucose-Capillary: 130 mg/dL — ABNORMAL HIGH (ref 65–99)
Glucose-Capillary: 119 mg/dL — ABNORMAL HIGH (ref 65–99)
Glucose-Capillary: 114 mg/dL — ABNORMAL HIGH (ref 65–99)
Glucose-Capillary: 108 mg/dL — ABNORMAL HIGH (ref 65–99)
Glucose-Capillary: 116 mg/dL — ABNORMAL HIGH (ref 65–99)

## 2017-06-28 IMAGING — DX DG CHEST 2V
2 series · 2 of 2 positions shown · non-contrast
Comparison: [DATE].

CLINICAL DATA: Chest tube removal.

EXAM:
CHEST - 2 VIEW

[w chest pa]
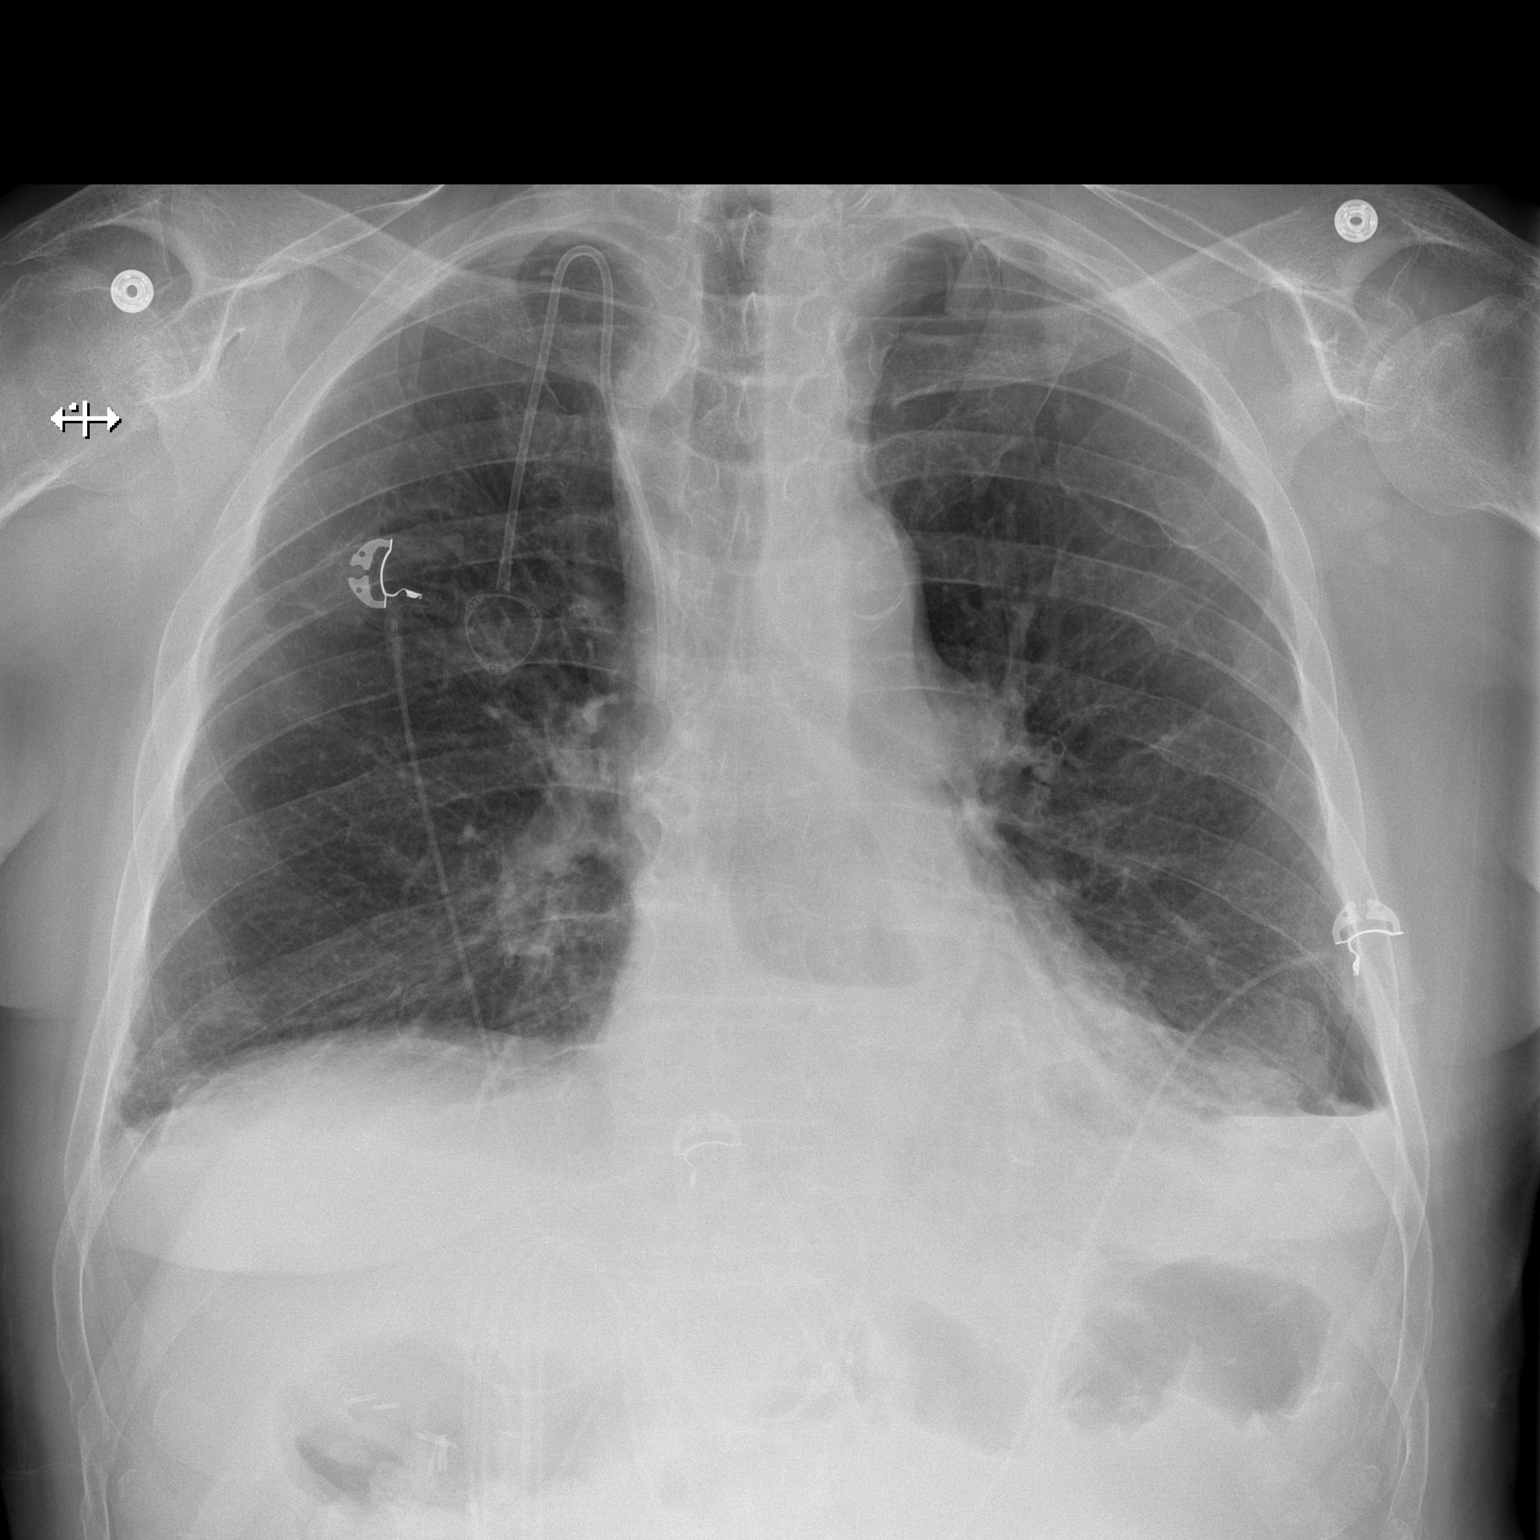

[w chest lat]
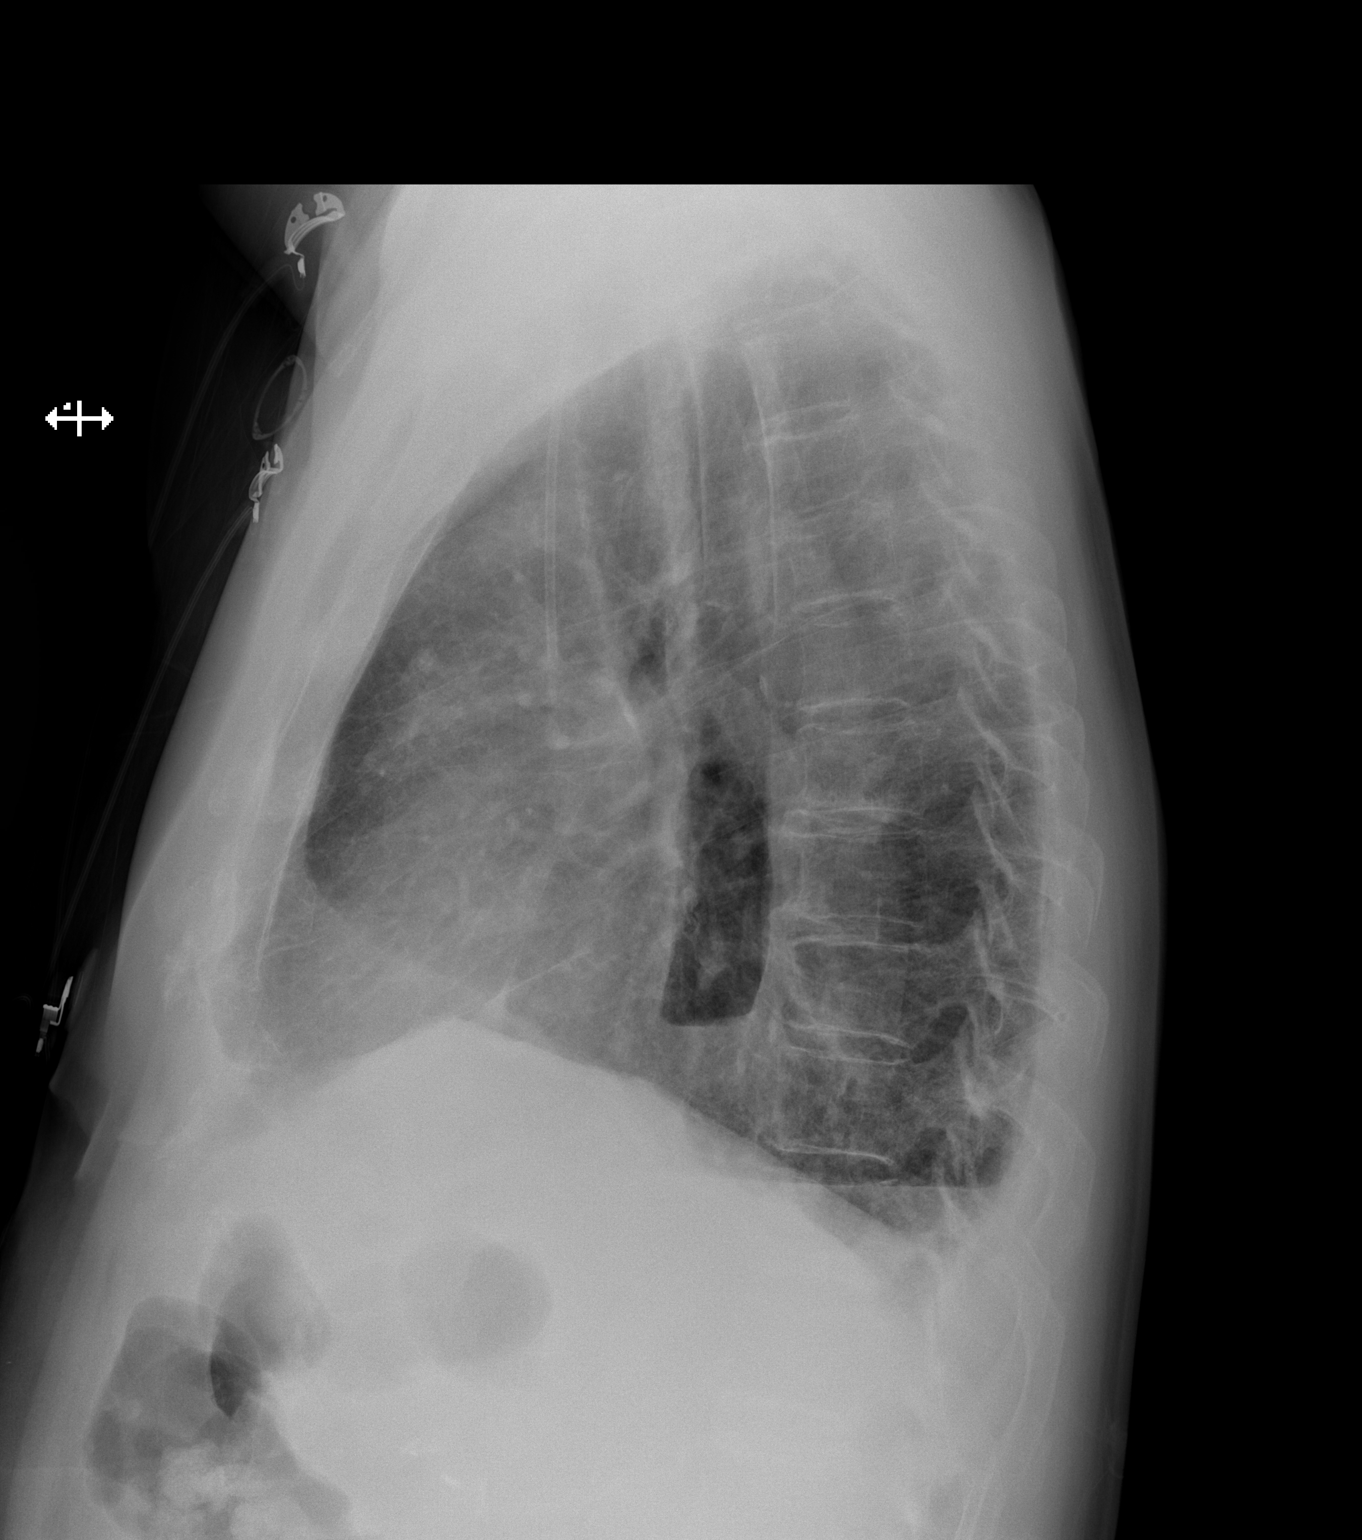

[2 of 2 positions shown; findings below may reference images not displayed]

FINDINGS: PowerPort catheter with tip over the superior vena cava. Interval
removal of left chest tube. Small left
pneumothorax/hydropneumothorax again noted. Similar findings on
prior exam. Heart size normal. Normal pulmonary vascularity. Low
lung volumes with mild bibasilar atelectasis/infiltrates. Esophagus
appears slightly dilated with air-fluid level noted over the lower
esophagus. Surgical clips right upper quadrant.
IMPRESSION: 1. Interval removal of left chest tube. Small
pneumothorax/hydropneumothorax again noted. Similar changes on prior
exam. Low lung volumes with mild basilar atelectasis.

2. Esophagus appears slightly dilated with an air-fluid level noted
over the lower esophagus.

## 2017-06-28 MED ORDER — MIDODRINE HCL 5 MG PO TABS
ORAL_TABLET | ORAL | Status: AC
  Filled 2017-06-28: qty 2

## 2017-06-28 NOTE — Progress Notes (Signed)
Unhooked himself the tube feeding and ambulated along the hallway, tolerated well.

## 2017-06-28 NOTE — Care Management Note (Signed)
Case Management Note Prev Note Created by Marvetta Gibbons RN, BSN Unit 4E-Case Manager-- [place] coverage [phone]  Patient Details  Name: Casey Acevedo MRN: [id number] Date of Birth: 1940/12/31  Subjective/Objective:   Pt admitted s/p cervical ESOPHAGECTOMY COMPLETE VIDEO BRONCHOSCOPY, and PYLOROMYOTOMY                 Action/Plan: PTA pt lived at home with wife- CM to follow for potential transition of care needs as pt progresses post op.   Expected Discharge Date:  06/28/17               Expected Discharge Plan:  [place]  In-House Referral:     Discharge planning Services  CM Consult  Post Acute Care Choice:    Choice offered to:     DME Arranged:    DME Agency:     HH Arranged:    HH Agency:     Status of Service:  In process, will continue to follow  If discussed at Long Length of Stay Meetings, dates discussed:    Discharge Disposition:   Additional Comments: 06/28/2017  Pt now passed swallow study - progressed to soft diet today.  CT's have been removed however pt still has penrose drain.  Pt independently ambulating in room per bedside nurse.  CM will continue to follow for discharge needs Maryclare Labrador, RN 06/28/2017, 1:57 PM

## 2017-06-28 NOTE — Progress Notes (Addendum)
      [address]       [place] [address]             [phone]      10 Days Post-Op Procedure(s) (LRB): cervical ESOPHAGECTOMY COMPLETE (N/A) VIDEO BRONCHOSCOPY (N/A) PYLOROMYOTOMY (N/A)   Subjective:  Casey Acevedo has several complaints.  He states he had diarrhea all day yesterday and had to wait several hours to receive immodium.  He states he is apprehensive to eat today as he does not want to have another day with diarrhea.  We have encouraged him to continue oral intake in small amounts.    Objective: Vital signs in last 24 hours: Temp:  [97.6 F (36.4 C)-98.1 F (36.7 C)] 97.6 F (36.4 C) (04/18 0756) Pulse Rate:  [109] 109 (04/18 0408) Cardiac Rhythm: Normal sinus rhythm (04/18 0756) Resp:  [14-21] 18 (04/18 0756) BP: (103-141)/(64-80) 103/70 (04/18 0756) SpO2:  [94 %-97 %] 97 % (04/18 0756) Weight:  [177 lb (80.3 kg)] 177 lb (80.3 kg) (04/18 0408)  Intake/Output from previous day: 04/17 0701 - 04/18 0700 In: 1180 [P.O.:460; NG/GT:720] Out: -   General appearance: alert, cooperative and no distress Heart: regular rate and rhythm Lungs: clear to auscultation bilaterally Abdomen: soft, non-tender; bowel sounds normal; no masses,  no organomegaly Wound: clean and dry, some drainage from penrose drain in neck  Lab Results: Recent Labs    06/27/17 0230 06/28/17 0323  WBC 7.4 7.2  HGB 12.0* 11.8*  HCT 37.1* 36.0*  PLT 272 310   BMET:  Recent Labs    06/27/17 0230 06/28/17 0323  NA 138 136  K 4.3 3.9  CL 102 103  CO2 28 25  GLUCOSE 108* 113*  BUN 14 15  CREATININE 0.87 0.79  CALCIUM 8.5* 8.3*    PT/INR: No results for input(s): LABPROT, INR in the last 72 hours. ABG    Component Value Date/Time   PHART 7.443 06/19/2017 0407   HCO3 22.5 06/19/2017 0407   TCO2 23 06/18/2017 1849   ACIDBASEDEF 1.1 06/19/2017 0407   O2SAT 94.5 06/19/2017 0407   CBG (last 3)  Recent Labs    06/27/17 2051 06/27/17 2257 06/28/17 0415    GLUCAP 108* 166* 120*    Assessment/Plan: S/P Procedure(s) (LRB): cervical ESOPHAGECTOMY COMPLETE (N/A) VIDEO BRONCHOSCOPY (N/A) PYLOROMYOTOMY (N/A)  1. S/P Esophagectomy- chest tubes have been removed, advancing penrose drain in neck, should be fully out tomorrow 2. Pulm- CXR looks good, minimal residual left pleural effusion, no pneumothorax 3. CV- hemodynamically stable 4. Renal- creatinine has been WNL 5. GI- tube feeds at goal of 60 ml/hr, patient with diarrhea--due to tube feedings and intake of liquid diet.. He has no N/V.Marland Kitchen Will advance diet to bariatric soft which will hopefully help bulk his stools, continue immodium prn  6. CBGs controlled 7. Dispo= patient stable, advance diet, immodium prn for diarrhea, continue advance penrose, possibly ready for d/c in next 24-48 hours   LOS: 10 days    Ellwood Handler 06/28/2017   Some loose stools yesterday Advance to soft diet Poss home one to two days  Will d/c home on night tube feeding I have seen and examined Casey Acevedo and agree with the above assessment  and plan.  Grace Isaac MD [facility] [phone] Office [phone] 06/28/2017 8:11 AM

## 2017-06-28 NOTE — Progress Notes (Signed)
Nutrition Follow-up  DOCUMENTATION CODES:   Not applicable  INTERVENTION:   - Continue Vital 1.5 @ 60 ml/hr via j-tube  TF provides 2160 kcal, 97 grams protein, and 1100 ml free water  - Continue Ensure Enlive poTID, each supplement provides 350 kcal and 20 grams of protein  -Once medically appropriate, transition to home TF regimen: Vital 1.5 4.5 cartons per day via cotinuous pump run over 16 hour period (approximately 67 ml/hr) with 120 ml free water flush 4 times daily. Complete regimen provides 1598 kcals, 72 grams protein, and 1295 ml free water flush, meeting 80% of estimated kcal needs and 74% of estimated protein needs.   NUTRITION DIAGNOSIS:   Increased nutrient needs related to cancer and cancer related treatments as evidenced by estimated needs.  Ongoing  GOAL:   Patient will meet greater than or equal to 90% of their needs  Met with TF  MONITOR:   Diet advancement, TF tolerance  REASON FOR ASSESSMENT:   Consult Enteral/tube feeding initiation and management  ASSESSMENT:   Pt with PMH of esophageal cancer dx summer 2018. Pt started on TPN 9/18 as inpatient at [facility] as pt met criteria for severe malnutrition. Pt had J-tube placed and started on TF 01/2017. Pt followed by RD at cancer center.  06/18/17 -complete esophagectomy 06/20/17 -start trickle TF via J-tube 06/24/17 - TF advanced to goal of 60 ml/hr 06/25/17 - esophagram/barium swallow, diet advanced to clear liquids 06/27/17- advanced to full liquid diet  Reviewed I/O's: +1.2 L x 24 hours, +8.1 L since admission  Case discussed with RN, who reports pt is very irritable this morning and requested that this RD not wake pt up, as he was sleeping at time of visit. Per RN, pt consumed very little of meal tray this AM, but has been consuming Ensure supplements well.   Spoke with SLP, who reports that pt should be able to tolerate a regular diet. CVTS MD wants pt to consume very small, frequent meals and this  is rationale for bariatric diet. Per RN, pt eats, but consumes very little. Pt had complaints about diarrhea today.   Vital 1.5 @ 60 ml/hr continues to infuse via j-tube.  TF provides 2160 kcal, 97 grams protein, and 1100 ml free water, meeting 100% of nutritional needs. Per discussion with RN, pt continues to tolerate TF well.  Per CVTS notes, likely discharge within the next 24-48 hours with plan to d/c home on nocturnal feedings.   Labs reviewed: CBGS: 120-166 (inpatient orders for glycemic control are 0-24 units insulin aspart every 4 hours).   Diet Order:  Diet bariatric advanced Room service appropriate? Yes; Fluid consistency: Thin  EDUCATION NEEDS:   No education needs have been identified at this time  Skin:  Skin Assessment: Skin Integrity Issues: Skin Integrity Issues:: Incisions Incisions: lt neck, lt chest  Last BM:  06/27/17  Height:   Ht Readings from Last 1 Encounters:  06/21/17 '5\' 9"'  (1.753 m)    Weight:   Wt Readings from Last 1 Encounters:  06/28/17 177 lb (80.3 kg)    Ideal Body Weight:  72.7 kg  BMI:  Body mass index is 26.14 kg/m.  Estimated Nutritional Needs:   Kcal:  2000-2300  Protein:  97-112 grams  Fluid:  > 2L/day    Kajuana Shareef A. Jimmye Norman, RD, LDN, CDE Pager: [phone] After hours Pager: [phone]

## 2017-06-29 ENCOUNTER — Inpatient Hospital Stay: Payer: Medicare Other

## 2017-06-29 LAB — GLUCOSE, CAPILLARY
Glucose-Capillary: 107 mg/dL — ABNORMAL HIGH (ref 65–99)
Glucose-Capillary: 96 mg/dL (ref 65–99)

## 2017-06-29 MED ORDER — OXYCODONE HCL 5 MG/5ML PO SOLN: 5.0000 mg | ORAL | 0 refills | Status: DC | PRN

## 2017-06-29 NOTE — Care Management Note (Signed)
Case Management Note Prev Note Created by Marvetta Gibbons RN, BSN Unit 4E-Case Manager-- [place] coverage [phone]  Patient Details  Name: Casey Acevedo MRN: [id number] Date of Birth: 01/02/1941  Subjective/Objective:   Pt admitted s/p cervical ESOPHAGECTOMY COMPLETE VIDEO BRONCHOSCOPY, and PYLOROMYOTOMY                 Action/Plan: PTA pt lived at home with wife- CM to follow for potential transition of care needs as pt progresses post op.   Expected Discharge Date:  06/29/17               Expected Discharge Plan:  [place]  In-House Referral:     Discharge planning Services  CM Consult  Post Acute Care Choice:    Choice offered to:     DME Arranged:    DME Agency:     HH Arranged:    [place] Agency:     Status of Service:  Completed, signed off  If discussed at [employer] of Stay Meetings, dates discussed:    Discharge Disposition:   Additional Comments: 06/29/2017  Pt to discharge home today - wife will transport pt home via private vehicle.  CM spoke with pt - pt informed CM tube feed supplies are supplied by [facility] - ample supply already in the home.  Pt denied needing HH and DME.  Pt stated "I already have everything I need".    06/28/17  Pt now passed swallow study - progressed to soft diet today.  CT's have been removed however pt still has penrose drain.  Pt independently ambulating in room per bedside nurse.  CM will continue to follow for discharge needs Maryclare Labrador, RN 06/29/2017, 9:32 AM

## 2017-06-29 NOTE — Discharge Instructions (Signed)
1.  Run home tube feedings at night only from 7 pm to 7 am--- if feeling too full or nauseated in the morning, may adjust feeding time to 7 pm to 5am     Esophageal Surgery   Patient and Family Education YOUR POST-ESOPHAGECTOMY CHECKLIST    Dear Patient and Family Member(s): The purpose of this handout  is to help answer your questions about esophageal surgery.   Please take the time to look through this information and share with your family.   We suggest you write down any questions you have to ask your doctor and healthcare team.  This booklet also contains information from your surgeon's office, including general information, insurance information, and directions to the office.  It is important that you or your family keep this  The nurses and doctors will refer to this information, especially as you prepare to go home!  Please ask the staff any questions you may have!   YOUR POST-ESOPHAGECTOMY CHECKLIST The purpose of this checklist is to make sure you will be ready to go home after surgery and have some items arranged BEFORE go home , so you have a smooth discharge from the hospital after your surgery.  o Quit smoking and avoid alcohol  o Do you have your home medicine list including pain medicine and understand it and are the prescriptions signed before you go home?  o Do you understand your diet instructions?   o Do you understand how to care for your Feeding tube, flushing and starting tube feeding from pump ?  o Do NOT take any non-steroidal anti-inflammatory drugs (for example, Motrin, ibuprofen, Aleve)  o Arrange for someone to be at home with you for 24/7 for the first week after discharge from the hospital. This person needs to be able to help you get in and out of bed, prepare food, change dressings, and start tube feedings if needed.  o Get a 12-inch wedge or 12-inch bed lifts to raise the head of the bed after you go home. Sleeping with your head elevated will  help prevent reflux.   o Have recommended foods at home for when you are discharged.   DIET INFORMATION Important points to keep in mind You should eat 6-8 small meals each day.  Large meals will not be well tolerated. Avoid very hot or cold beverages and spicy foods.  Protein supplements, high-energy foods, or a soft diet may be ordered.  Drinking fluids between meals rather than with meals may also be helpful.    A clinical dietician will be involved in your care, who will help in planning your meals.  You should sit upright, chew slowly, and eat more than 3 hours before bedtime to reduce reflux.   1.   Your body needs added calories and protein to help heal itself. Start slowly and gradually eat more as you are able. 2.   Eat small, frequent meals at least six times per day. 3.   Everyone tolerates foods differently.  Avoid those foods known to cause you problems. 4.   Keep high-calorie snacks handy, such as cheese, peanut butter, and yogurt. 5.   Increase your protein in foods by adding shredded cheese, dry milk powder, or peanut butter. 6.   Drink only nutritious beverages. Try unsweetened juice, Ensure, Boost or Carnation Instant Breakfast instead of coffee, tea, soda, or water. 7.   Do not lay down immediately after eating. Stay upright for at least 2 hours.  Dumping Syndrome When  food or fluids move too quickly through your digestive system, it is called "dumping syndrome." Symptoms of dumping syndrome include nausea, dizziness or light-headedness, weakness and fatigue, rapid pulse, abdominal cramping, and diarrhea. It is essential to tell your doctor if you have any of these symptoms.  Here are some tips to help avoid dumping syndrome: 1.  Don't drink liquids with your meals.  Wait 30 minutes to 1 hour after eating solid food to drink something. 2.  Limit sweets. Use sugar-free foods and drinks in place of regular sweet foods or drinks.   3.  Lactose (milk sugar) may also cause  diarrhea and cramping. Drink lactose-free or lactose-reduced milk, or take lactase enzyme tablets (like Dairy Ease) when you eat dairy products. 4.  Adding extra fats (like butter, margarine, cheese, gravy, cream, or sour cream) to foods may help slow down the movement of food through your system. 5.  Avoid extremely hot or cold foods. 6.  Avoid carbonated drinks and alcohol. 7.  Eat slowly and chew your food carefully.  DISCHARGE INSTRUCTIONS  Take a few minutes each day to inspect your surgical incision for any signs or symptoms of infection or other complications (increased pain, swelling, fever, drainage, saliva leaking at the incision site). Report any problems to your surgeon immediately. See your doctor right away if you experience any difficulty swallowing.  If you smoked, do not go back to smoking!! Do not be in a room or a car with someone else who is smoking. Smoking and tobacco use can make you not heal as well. [place] offers FREE smoking cessation classes that can help you quit smoking. For information on classes, and to register, call [phone].  Shower every day. Wash the incision gently with a mild unscented soap. Use a clean washcloth each time you wash. Pat your incision dry. Do not use lotions, creams, ointments, powders, or home remedies on or near your incisions. Your incision may be numb or sore for several weeks or months after your surgery.  Avoid heavy activity or anything that tenses your body for 12 weeks after surgery. You may resume your daily activities, work, and sexual relations as soon as you feel able to do so. No driving for the first 3 weeks after going home.  Weigh yourself several times a week. Report any significant weight changes to your doctor (10 pounds in 2 weeks).  Try not to take pain relievers longer than 4 to 7 days. Talk with your doctor if you continue to have pain that requires pain medication after a few days.   To prevent  constipation, take stool softeners at least as long as you take pain medication. If you are sent home with antibiotics, please take all of them even if you feel fine.  Call your doctor if any of the following occur: fever of 101 degrees or higher, increased pain, swelling, redness, draining, or bleeding around your incision; vomiting; excessive weakness; tarry (black) stools; new, unexplained symptoms (they may be adverse reactions to drugs used in treatment); progressive weight loss; or continuous diarrhea.  Call the Surgeons office immediately if your feeding tube becomes clogged or falls out.  Keep follow-up appointments so that your physician can check on your progress and condition.  Bring your medicine you are currently taking with you to your appointment.  The [employer], survival support groups, social workers, chaplains, counselors, and smoking cessation programs may be helpful.      [facility]  [address]        , [place]  [address]  At [facility] we are dedicated to serving our patients.  We are committed to caring for you in the most competent and courteous manner possible.  We hope that the following information will be helpful.  If you have any questions, please do not hesitate to ask.  OFFICE HOURS  Our office hours are 9:00AM to 5:00PM, Monday through Friday.  Our patients are seen by appointment only. If you are having any problems, we encourage you to call during office hours. In case of an emergency, one of our surgeons is on call at all times. If the office is closed, call the main switchboard and hold until the answering service responds. Your call will be returned as soon as possible.  Telephone Numbers Main Number:  [phone] For Appointments: [phone] Fax Number: [phone]  GENERAL INFORMATION There are five physicians in the [facility] practice.  Each doctor has a scheduled office day once a  week, in which to see his patient s. The remainder of the week is left open for surgical cases. Our doctors perform surgery at [facility]. Because of the nature of their surgical cases, it is sometimes necessary to cancel appointments or rearrange appointment times. However, if that happens, we put forth every effort to keep the patient's best interest first on our priority list.   [facility] Percell Miller B. Servando Snare, MD     Ellwood Handler, PA-C Gaye Pollack, MD.      Nicholes Rough, PA-C Len Childs, MD     Jadene Pierini, PA-C Revonda Standard. Roxan Hockey, MD     Lars Pinks, PA-C Valentina Gu. Roxy Manns, MD             Directions to our office:  Traveling [place] [address]: From [address], turn right onto [address], then turn left onto [address]. Turn right onto [address] and then left onto [address].  The [facility] will be on the left.  Traveling [place]: From [address], turn left onto [address], then turn right onto [address]. Turn right onto [address] and then left onto [address].  The [facility] will be on the left.  [place]: From [place], turn right onto [address], then take the [address] exit. Follow [address] northbound. Continue until these streets divide and follow [address] to the right. Continue through downtown [place]. Turn right onto [address] and then right onto [address].  The [facility] will be on the left.  Traveling [place]: From [place], turn left onto [address] [place], then take the [address] exit. Follow [address] northbound. Continue until these streets div ide and follow [address] to the right. Continue through downtown [place]. Turn right onto [address] and then right onto [address]. The [facility] will be on the  left.  Traveling [address]: From [address] [place], take the [address] exit. Follow [address] for about 7 miles and turn left onto [address]. Go one block and turn left onto [facility] and then left onto [address].  The [facility] will be on the left.  Traveling [address] [place]: From [place], take the [address] exit. Follow [address] northbound. Continue until these streets divide and follow [address] to the right. Continue through downtown [place]. Turn right onto [address] and then right onto [address].  The [facility] will be on the left.  Traveling [address]: From [place], take the [address] exit. Follow [address] northbound. Continue until these streets divide and follow [address] to the right. Continue through downtown [place]. Turn right onto [address] and then right onto [address].  The [facility] will be on the left.

## 2017-06-29 NOTE — Progress Notes (Signed)
      [address]       [place] [address]             [phone]      11 Days Post-Op Procedure(s) (LRB): cervical ESOPHAGECTOMY COMPLETE (N/A) VIDEO BRONCHOSCOPY (N/A) PYLOROMYOTOMY (N/A)   Subjective:  No new complaints.  Wants to go home.  States he has everything at home and is ready to go.  Objective: Vital signs in last 24 hours: Temp:  [97.7 F (36.5 C)-98.1 F (36.7 C)] 97.9 F (36.6 C) (04/19 0750) Pulse Rate:  [88-99] 88 (04/19 0750) Cardiac Rhythm: Sinus tachycardia (04/19 0400) Resp:  [14-22] 17 (04/19 0750) BP: (109-132)/(70-79) 119/76 (04/19 0750) SpO2:  [96 %-98 %] 98 % (04/19 0750)  Intake/Output from previous day: 04/18 0701 - 04/19 0700 In: 897 [P.O.:237; NG/GT:600] Out: -   General appearance: alert, cooperative and no distress Heart: regular rate and rhythm Lungs: clear to auscultation bilaterally Abdomen: soft, non-tender; bowel sounds normal; no masses,  no organomegaly Wound: clean and dry  Lab Results: Recent Labs    06/27/17 0230 06/28/17 0323  WBC 7.4 7.2  HGB 12.0* 11.8*  HCT 37.1* 36.0*  PLT 272 310   BMET:  Recent Labs    06/27/17 0230 06/28/17 0323  NA 138 136  K 4.3 3.9  CL 102 103  CO2 28 25  GLUCOSE 108* 113*  BUN 14 15  CREATININE 0.87 0.79  CALCIUM 8.5* 8.3*    PT/INR: No results for input(s): LABPROT, INR in the last 72 hours. ABG    Component Value Date/Time   PHART 7.443 06/19/2017 0407   HCO3 22.5 06/19/2017 0407   TCO2 23 06/18/2017 1849   ACIDBASEDEF 1.1 06/19/2017 0407   O2SAT 94.5 06/19/2017 0407   CBG (last 3)  Recent Labs    06/28/17 1939 06/28/17 2317 06/29/17 0450  GLUCAP 108* 116* 107*    Assessment/Plan: S/P Procedure(s) (LRB): cervical ESOPHAGECTOMY COMPLETE (N/A) VIDEO BRONCHOSCOPY (N/A) PYLOROMYOTOMY (N/A)  1. S/P Esophagectomy- doing well, penrose drain out, chest tubes out, incisions healing well 2. CV- remains hemodynamically stable 3. GI- will run tube  feeds nightly only at 60 ml per hour... No further diarrhea but patient does notice after drinks ensure he gets "explosive diarrhea" encouraged patient to pay attention to what he is eating to look for causes of when he gets diarrhea episodes 4. Dispo- patient stable, will d/c home today   LOS: 11 days    Casey Acevedo 06/29/2017

## 2017-06-29 NOTE — Progress Notes (Signed)
Patient discharged home with wife. Medications and discharge paperwork reviewed with patient and family who verbalized understanding. Personal belongings and paperwork packed, IV removed. Patient says he has all supplies he needs at home. Patient wheeled to front entrance.

## 2017-07-09 ENCOUNTER — Inpatient Hospital Stay: Payer: Medicare Other | Attending: Oncology

## 2017-07-09 DIAGNOSIS — C155 Malignant neoplasm of lower third of esophagus: Secondary | ICD-10-CM | POA: Diagnosis not present

## 2017-07-09 DIAGNOSIS — Z452 Encounter for adjustment and management of vascular access device: Secondary | ICD-10-CM | POA: Insufficient documentation

## 2017-07-09 DIAGNOSIS — Z95828 Presence of other vascular implants and grafts: Secondary | ICD-10-CM

## 2017-07-09 MED ORDER — HEPARIN SOD (PORK) LOCK FLUSH 100 UNIT/ML IV SOLN
500.0000 [IU] | INTRAVENOUS | Status: AC | PRN
  Administered 2017-07-09: 500 [IU]

## 2017-07-09 MED ORDER — SODIUM CHLORIDE 0.9% FLUSH
10.0000 mL | INTRAVENOUS | Status: AC | PRN
  Administered 2017-07-09: 10 mL
  Filled 2017-07-09: qty 10

## 2017-07-09 NOTE — Progress Notes (Signed)
Nutrition Follow-up:  Met with patient and wife following port flush today.  Concerned about nutrition and not sure what to eat post surgery.  Patient reports that he continues to take 4 cartons of vital 1.5 via J-tube.    Has tried drinking ensure but had issues with diarrhea after drinking it.    Has been trying to eat solid foods (cottage cheese, eggs, cheese, yogurt, applesauce) and done well.  Reports no bowel movement since 4/23.  Reports that Dr. Servando Snare wanted him to reduce tube feeding by 2 cartons by 5/9 when he follows up with him.  Patient has tried to drop one carton and feels weak and shaky.  Reports that he is drinking fluids and continuing water flush through tube.    Feels nauseated and has been trying to eat small frequent meals.  Has been separating liquids from solid foods as well.   Medications: reviewed  Labs: none today  Anthropometrics:   Reports stable weight as weighs at home every day.    Estimated Energy Needs  Kcals: 2050-2460 calories/d Protein: 102-123 g/d Fluid: 2.4 L/d  NUTRITION DIAGNOSIS: Inadequate oral intake continues   MALNUTRITION DIAGNOSIS: Moderate malnutrition resolved   INTERVENTION:   Encouraged low fiber, low sugar foods for patient to try.  Sample meal plan given and foods to eat and avoid.   Discussed lower sugar/carbohydrate oral nutrition supplement options for patient to try (premier protein, unjury, glucerna).   Patient will begin incorporating more solid foods into diet and slowly decrease tube feeding.  Patient will call Dr. Everrett Coombe office regarding lack of bowel movement    MONITORING, EVALUATION, GOAL: weight trends, TF, po intake   NEXT VISIT: phone follow-up on May 6  Mariah Harn B. Zenia Resides, [place], [place] Registered Dietitian [phone] (pager)

## 2017-07-10 ENCOUNTER — Ambulatory Visit: Payer: Medicare Other | Admitting: Radiation Oncology

## 2017-07-18 ENCOUNTER — Other Ambulatory Visit: Payer: Self-pay | Admitting: Cardiothoracic Surgery

## 2017-07-18 DIAGNOSIS — K222 Esophageal obstruction: Secondary | ICD-10-CM

## 2017-07-19 ENCOUNTER — Ambulatory Visit
Admission: RE | Admit: 2017-07-19 | Discharge: 2017-07-19 | Disposition: A | Discharge status: Home / Self Care | Payer: Medicare Other | Source: Ambulatory Visit | Attending: Cardiothoracic Surgery | Admitting: Cardiothoracic Surgery

## 2017-07-19 ENCOUNTER — Ambulatory Visit (INDEPENDENT_AMBULATORY_CARE_PROVIDER_SITE_OTHER): Payer: Self-pay | Admitting: Cardiothoracic Surgery

## 2017-07-19 ENCOUNTER — Other Ambulatory Visit: Payer: Self-pay

## 2017-07-19 ENCOUNTER — Telehealth: Payer: Self-pay

## 2017-07-19 VITALS — BP 105/69 | HR 86 | Resp 20 | Ht 69.0 in | Wt 178.0 lb

## 2017-07-19 DIAGNOSIS — K222 Esophageal obstruction: Secondary | ICD-10-CM

## 2017-07-19 DIAGNOSIS — Z9049 Acquired absence of other specified parts of digestive tract: Secondary | ICD-10-CM

## 2017-07-19 DIAGNOSIS — Z9889 Other specified postprocedural states: Secondary | ICD-10-CM

## 2017-07-19 DIAGNOSIS — C155 Malignant neoplasm of lower third of esophagus: Secondary | ICD-10-CM

## 2017-07-19 IMAGING — DX DG CHEST 2V
2 series · 2 of 2 positions shown · non-contrast
Comparison: Chest x-ray dated [DATE].

CLINICAL DATA: History of esophageal cancer with stricture and
complete esophagectomy.

EXAM:
CHEST - 2 VIEW

[dg chest 2 view (1 of 2)]
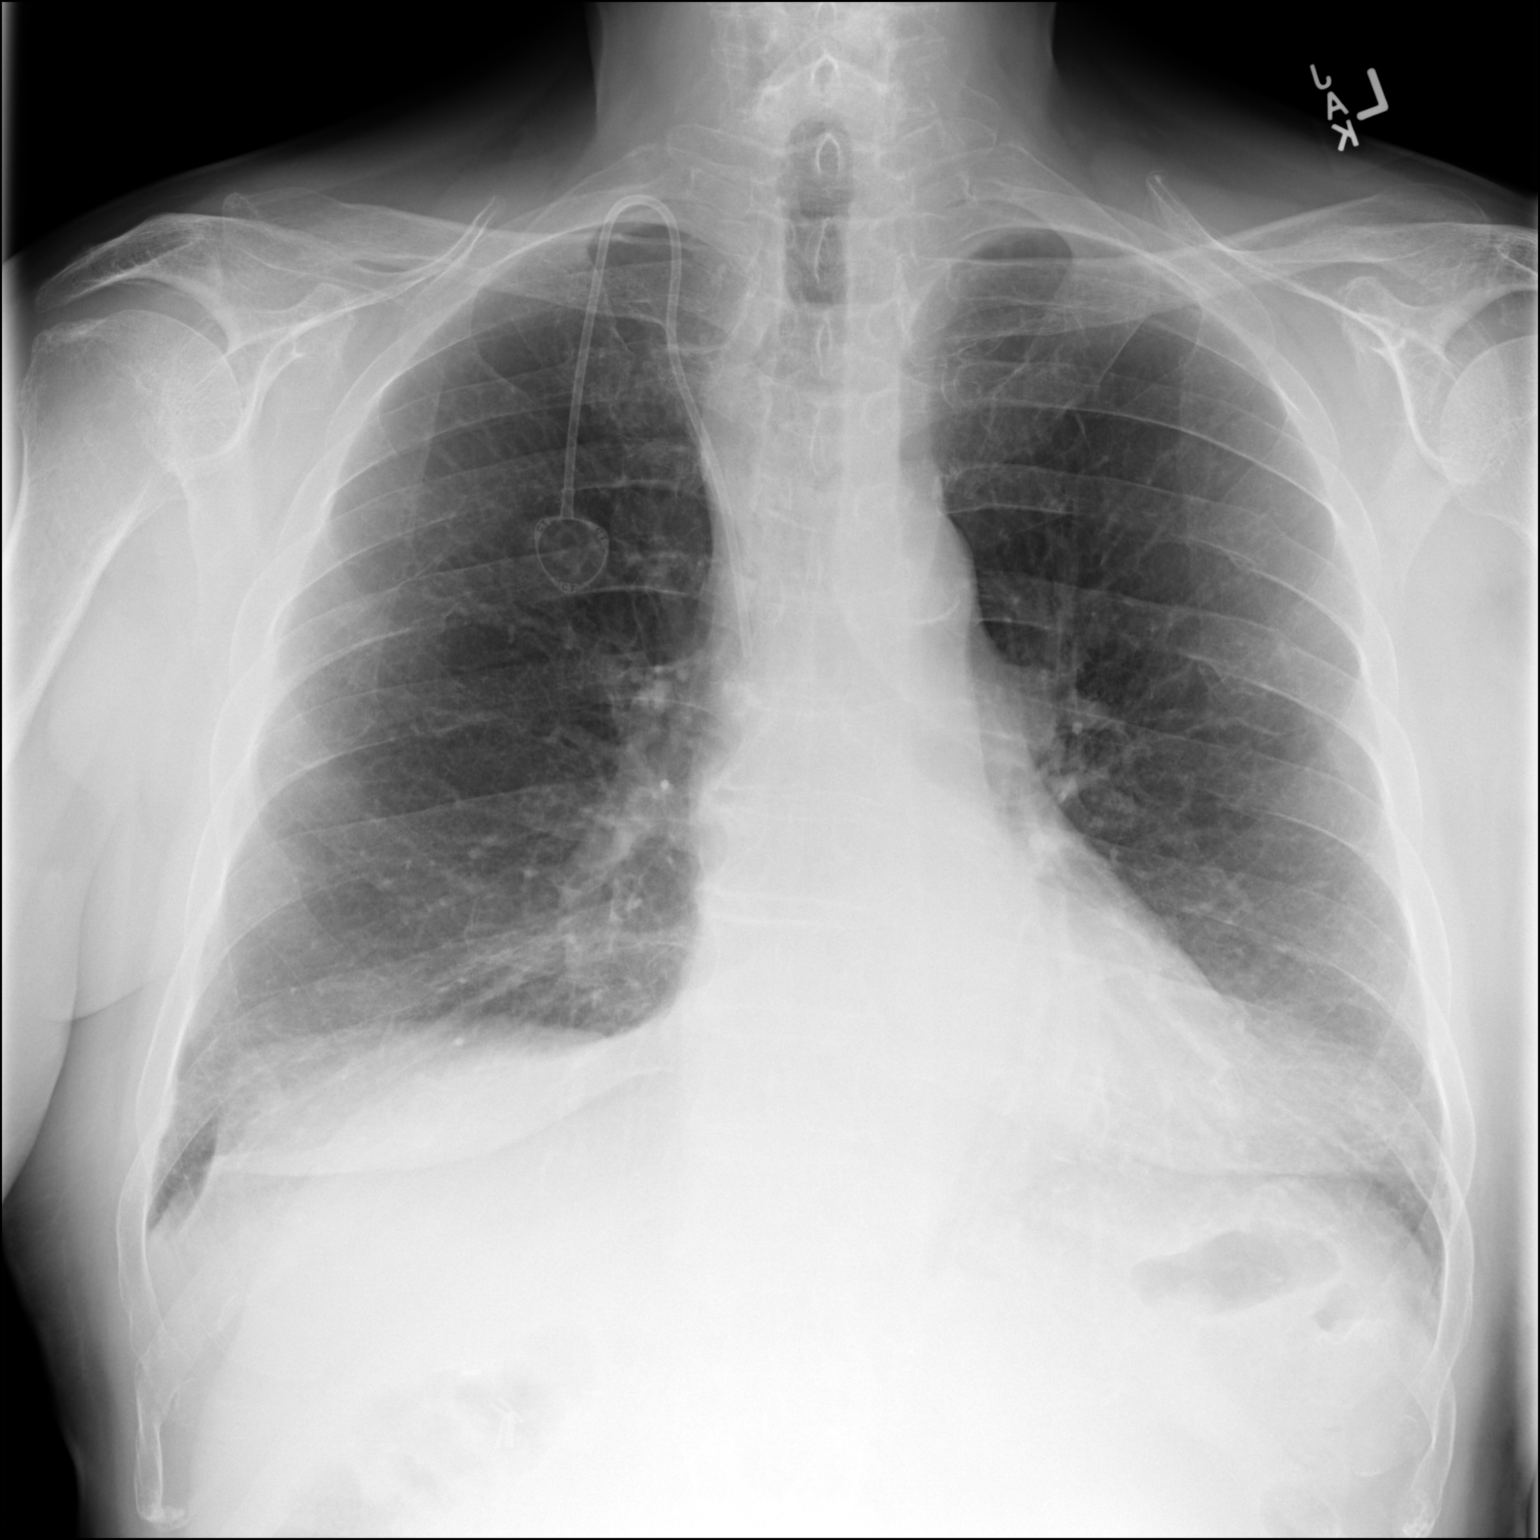

[dg chest 2 view (2 of 2)]
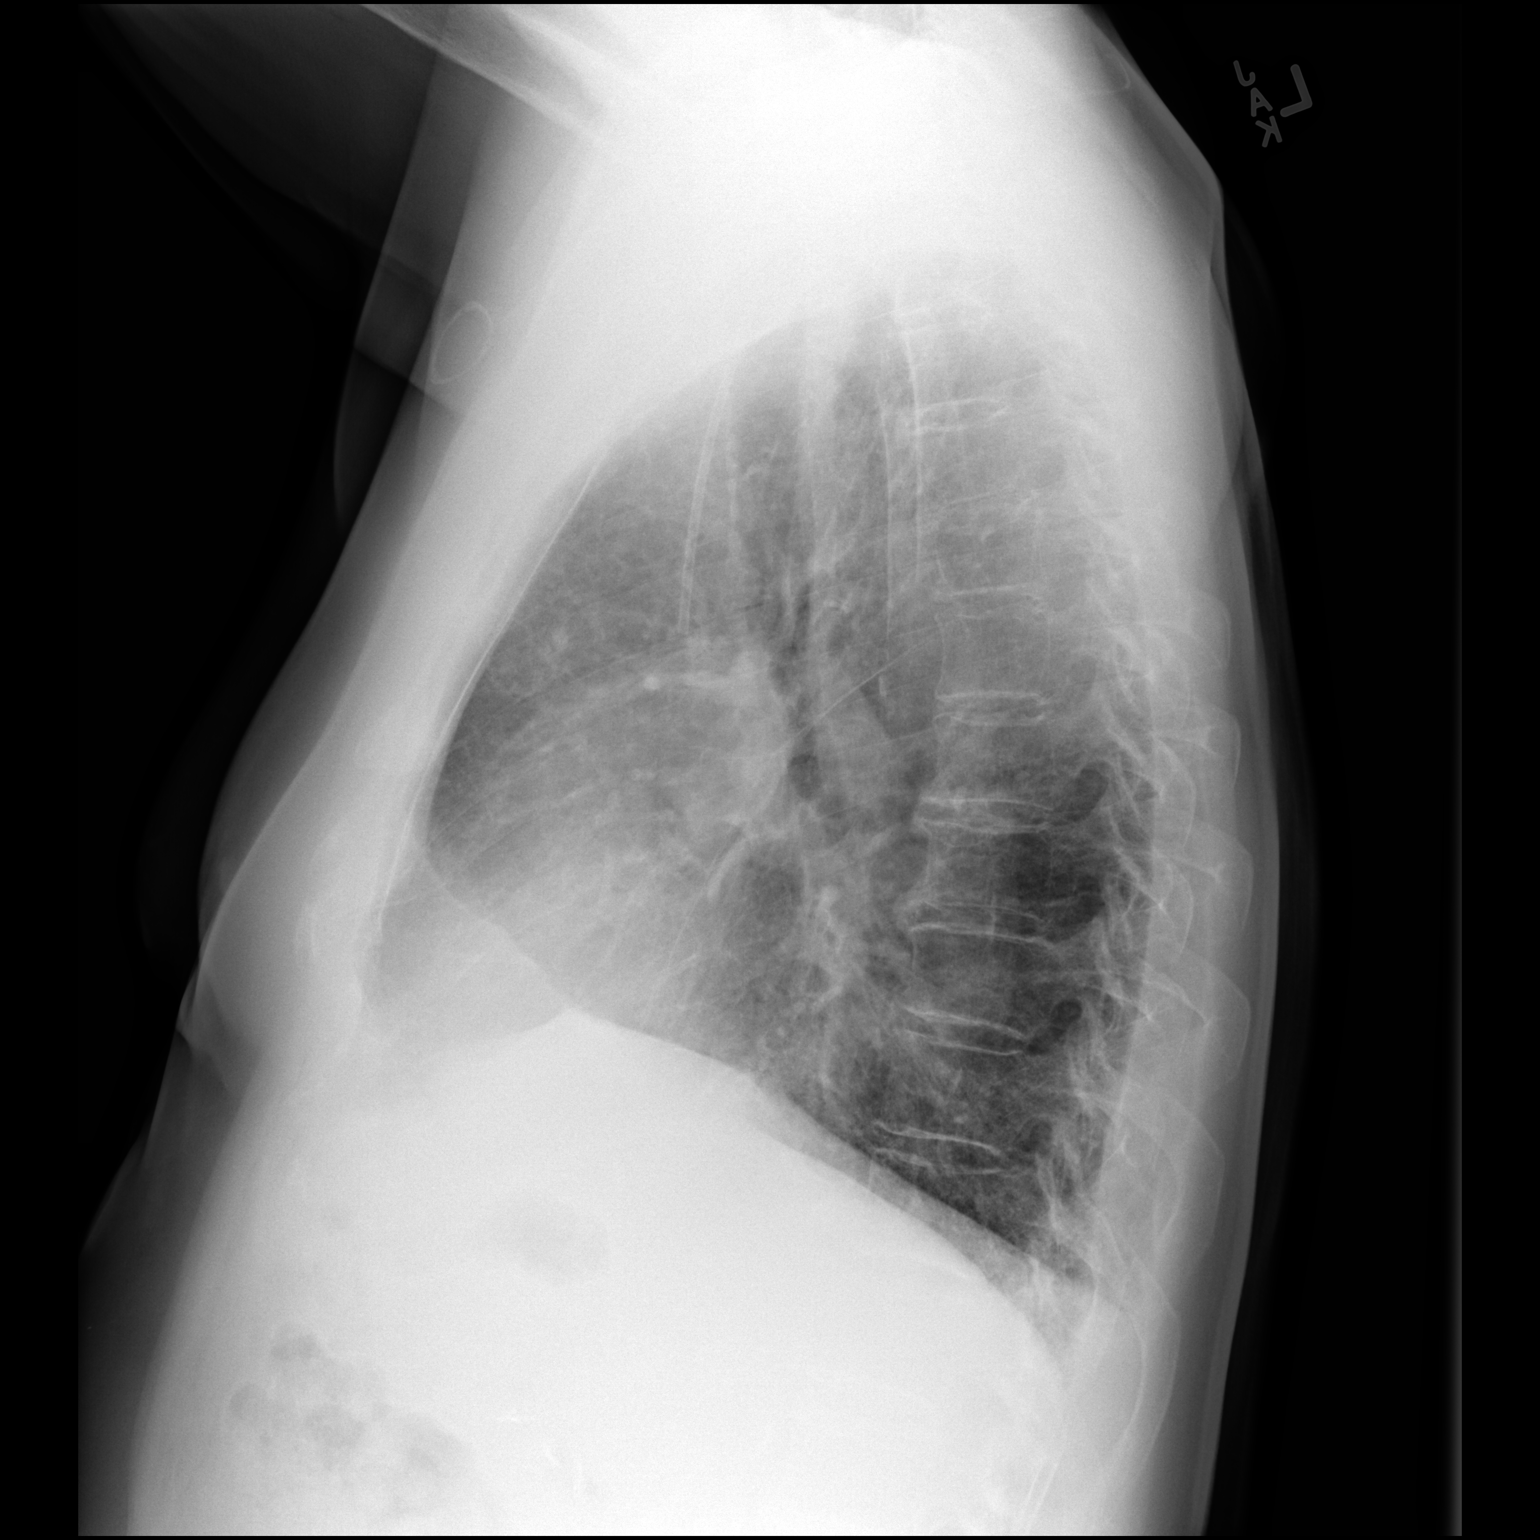

[2 of 2 positions shown; findings below may reference images not displayed]

FINDINGS: Heart size and mediastinal contours are stable. Esophageal air-fluid
level appreciated on the previous exam is not seen on today's study.
RIGHT chest wall Port-A-Cath is stable in position with tip at the
level of the mid/upper SVC.

Lungs are clear. No pleural effusion or pneumothorax seen. Small
pneumothorax demonstrated on the previous study is not seen on
today's exam. No acute or suspicious osseous finding.
IMPRESSION: No active cardiopulmonary disease. No evidence of pneumonia,
pulmonary edema or pneumothorax on today's exam.

## 2017-07-19 MED ORDER — OXYCODONE HCL 5 MG PO TABS: 5.0000 mg | ORAL_TABLET | ORAL | 0 refills | Status: DC | PRN

## 2017-07-19 NOTE — Telephone Encounter (Signed)
Nutrition  Called patient for nutrition follow-up.  Left message on cell phone for patient to call RD back.  Shean Gerding B. Zenia Resides, [place], [place] Registered Dietitian [phone] (pager)

## 2017-07-19 NOTE — Telephone Encounter (Signed)
Nutrition Follow-up:  Patient called RD back for nutrition follow-up.  Reports that he had been out for his morning walk.  Tries to walk every morning between 9-10am.  Reports that his endurance is still lacking but he is trying to build his strength back up.  Reports pulled a few weeds this am as well.  Reports he has to take frequent breaks and not able to do things he wants to do.    Reports that he is eating more solid foods.  Reports ate ham and eggs for breakfast yesterday, pimento cheese sandwich and chicken pot pie for supper yesterday.  Reports that he tried 1/2 of one of the shakes but had diarrhea immediately after (not sure which one he tried).  Reports if he eats a pudding cup he has diarrhea.  Feels like anything sweet/dairy products cause diarrhea (although able to eat cheese, cottage cheese, yogurt without difficulty).    Patient continues with 4 cartons of vital 1.5 via tube at this time.     Estimated Energy Needs  Kcals: 2050-2460 calories/d Protein: 102-123 g/d Fluid: 2.4 L/d  NUTRITION DIAGNOSIS: Inadequate oral intake improving   MALNUTRITION DIAGNOSIS: none at this time   INTERVENTION:  Encouraged patient to continue to consume solid foods that are high in calories and protein. Encouraged small frequent meals Encouraged patient to try lactose free, lower sugar items.   Patient to continue to incorporate more solid foods and slowly decrease tube feeding.   Encouraged patient to continue exercise and activity to build endurance.      MONITORING, EVALUATION, GOAL: weight trends, TF, po intake   NEXT VISIT: June 13 following MD appointment  Vicci Reder B. Zenia Resides, [place], [place] Registered Dietitian [phone] (pager)

## 2017-07-19 NOTE — Progress Notes (Signed)
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 SURGICAL PROCEDURES:  Video bronchoscopy, transhiatal total esophagectomy with cervical esophagogastrostomy, pyloromyotomy. SURGEON:  Lanelle Bal, MD.   History of Present Illness:     Patient returns to the office approximately 4 weeks following transhiatal total esophagectomy.  Overall the patient is doing well he notes that he is able to take a p.o. diet relatively well, he does note low-grade nausea in the mornings, Ensure supplements if given him loose stools and he is quit taking,.  He has noted some bile reflux when laying down.     Wt Readings from Last 3 Encounters:  07/19/17 178 lb (80.7 kg)  06/28/17 177 lb (80.3 kg)  06/14/17 180 lb (81.6 kg)   Past Medical History:  Diagnosis Date  . Cancer ([place])    esophageal  . Dysphagia      Social History   Tobacco Use  Smoking Status Never Smoker  Smokeless Tobacco Never Used    Social History   Substance and Sexual Activity  Alcohol Use No     No Known Allergies  Current Outpatient Medications  Medication Sig Dispense Refill  . loperamide (IMODIUM) 2 MG capsule Take 1 capsule (2 mg total) by mouth as needed for diarrhea or loose stools (take two tablests after the first loose stool, then 1 tablet after each loose stool).    . Nutritional Supplements (FEEDING SUPPLEMENT, VITAL 1.5 CAL,) LIQD Give vital 1.5 via continuous pump at 36ml/hr for 22 hours.  Flush with 226ml of water before starting tube feeding and after stopping  tube feeding.  Provide additional 233ml of water 2 other times during waking hours via J tube. (Patient taking differently: Take 1,000 mLs by mouth continuous. Give vital 1.5 via continuous pump at 41ml/hr for 20 hours.  Flush with 250ml of water before starting tube feeding and after stopping tube feeding.  Provide additional 283ml of water 2 other times during waking hours via J tube.) 1540 mL 12  . oxyCODONE (OXY IR/ROXICODONE) 5 MG immediate release tablet Take 1 tablet (5 mg total) by mouth every 4 (four) hours as needed for severe pain. 30 tablet 0   No current facility-administered medications for this visit.    Facility-Administered Medications Ordered in Other Visits  Medication Dose Route Frequency Provider Last Rate Last Dose  . 0.9 %  sodium chloride infusion   Intravenous Once Sindy Guadeloupe, MD      . ondansetron (ZOFRAN) 8 mg, dexamethasone (DECADRON) 10 mg in sodium chloride 0.9 % 50 mL IVPB   Intravenous Once Sindy Guadeloupe, MD           Physical Exam: BP 105/69   Pulse 86   Resp 20   Ht 5\' 9"  (1.753 m)   Wt 178 lb (80.7 kg)   SpO2 98% Comment: RA  BMI 26.29 kg/m   General appearance: alert and cooperative Neurologic: intact Heart: regular rate and rhythm, S1, S2 normal, no  murmur, click, rub or gallop Lungs: clear to auscultation bilaterally Abdomen: soft, non-tender; bowel sounds normal; no masses,  no organomegaly Extremities: extremities normal, atraumatic, no cyanosis or edema Wound: Left neck incision and abdominal incisions are well-healed Feeding jejunostomy tubes in place without surrounding erythema or evidence of infection  Diagnostic Studies & Laboratory data:     Recent Radiology Findings:   Dg Chest 2 View  Result Date: 07/19/2017 CLINICAL DATA:  History of esophageal cancer with stricture and complete esophagectomy. EXAM: CHEST - 2 VIEW COMPARISON:  Chest x-ray dated 06/28/2017. FINDINGS: Heart size and mediastinal contours are stable. Esophageal  air-fluid level appreciated on the previous exam is not seen on today's study. RIGHT chest wall Port-A-Cath is stable in position with tip at the level of the mid/upper SVC. Lungs are clear. No pleural effusion or pneumothorax seen. Small pneumothorax demonstrated on the previous study is not seen on today's exam. No acute or suspicious osseous finding. IMPRESSION: No active cardiopulmonary disease. No evidence of pneumonia, pulmonary edema or pneumothorax on today's exam. Electronically Signed   By: Franki Cabot M.D.   On: 07/19/2017 14:08    I have independently reviewed the above radiology studies  and reviewed the findings with the patient.   Recent Lab Findings: Lab Results  Component Value Date   WBC 7.2 06/28/2017   HGB 11.8 (L) 06/28/2017   HCT 36.0 (L) 06/28/2017   PLT 310 06/28/2017   GLUCOSE 113 (H) 06/28/2017   TRIG 92 12/11/2016   ALT 22 06/26/2017   AST 12 (L) 06/26/2017   NA 136 06/28/2017   K 3.9 06/28/2017   CL 103 06/28/2017   CREATININE 0.79 06/28/2017   BUN 15 06/28/2017   CO2 25 06/28/2017   INR 1.00 06/14/2017      Assessment / Plan:   Patient is progressing satisfactorily after recent transhiatal total esophagectomy, I have again cautioned him about maneuvers to cut down on file reflux consider Carafate if if elevation of the head of his bed does not improve, and avoiding eating 2 hours before laying down.  He will continue to use tube feedings at nighttime only.   Plan to see him back in approximately 4 weeks    Cancer Staging Malignant neoplasm of lower third of esophagus [facility]) Staging form: Esophagus - Adenocarcinoma, AJCC 8th Edition - Clinical stage from 01/04/2017: Stage Unknown (cTX, cN0, cM0) - Signed by Sindy Guadeloupe, MD on 01/04/2017 - Pathologic stage from 06/22/2017: Stage Unknown (ypTX, pN1, cM0, GX) - Signed by Grace Isaac, MD on 06/22/2017     Grace Isaac MD      [place] [place] [place] [address] Office  [phone]   Beeper [phone]  07/19/2017 6:06 PM

## 2017-07-27 ENCOUNTER — Emergency Department (HOSPITAL_COMMUNITY): Payer: Medicare Other

## 2017-07-27 ENCOUNTER — Emergency Department (HOSPITAL_COMMUNITY)
Admission: EM | Admit: 2017-07-27 | Discharge: 2017-07-28 | Discharge status: Against Medical Advice | Payer: Medicare Other | Attending: Emergency Medicine | Admitting: Emergency Medicine

## 2017-07-27 ENCOUNTER — Encounter: Payer: Self-pay | Admitting: *Deleted

## 2017-07-27 ENCOUNTER — Encounter (HOSPITAL_COMMUNITY): Payer: Self-pay

## 2017-07-27 DIAGNOSIS — R42 Dizziness and giddiness: Secondary | ICD-10-CM | POA: Insufficient documentation

## 2017-07-27 DIAGNOSIS — R109 Unspecified abdominal pain: Secondary | ICD-10-CM | POA: Insufficient documentation

## 2017-07-27 DIAGNOSIS — R112 Nausea with vomiting, unspecified: Secondary | ICD-10-CM | POA: Diagnosis present

## 2017-07-27 LAB — CBC WITH DIFFERENTIAL/PLATELET
Abs Immature Granulocytes: 0 10*3/uL (ref 0.0–0.1)
BASOS ABS: 0.1 10*3/uL (ref 0.0–0.1)
BASOS PCT: 1 %
Eosinophils Absolute: 0 10*3/uL (ref 0.0–0.7)
Eosinophils Relative: 1 %
HCT: 45.1 % (ref 39.0–52.0)
Hemoglobin: 14.6 g/dL (ref 13.0–17.0)
Immature Granulocytes: 0 %
Lymphocytes Relative: 23 %
Lymphs Abs: 1.8 10*3/uL (ref 0.7–4.0)
MCH: 26.7 pg (ref 26.0–34.0)
MCHC: 32.4 g/dL (ref 30.0–36.0)
MCV: 82.4 fL (ref 78.0–100.0)
MONO ABS: 0.7 10*3/uL (ref 0.1–1.0)
Monocytes Relative: 9 %
Neutro Abs: 5.2 10*3/uL (ref 1.7–7.7)
Neutrophils Relative %: 66 %
PLATELETS: 270 10*3/uL (ref 150–400)
RBC: 5.47 MIL/uL (ref 4.22–5.81)
RDW: 16 % — AB (ref 11.5–15.5)
WBC: 7.9 10*3/uL (ref 4.0–10.5)

## 2017-07-27 LAB — COMPREHENSIVE METABOLIC PANEL
ALT: 18 U/L (ref 17–63)
AST: 15 U/L (ref 15–41)
Albumin: 3.9 g/dL (ref 3.5–5.0)
Alkaline Phosphatase: 96 U/L (ref 38–126)
Anion gap: 10 (ref 5–15)
BILIRUBIN TOTAL: 0.7 mg/dL (ref 0.3–1.2)
BUN: 19 mg/dL (ref 6–20)
CO2: 25 mmol/L (ref 22–32)
CREATININE: 0.89 mg/dL (ref 0.61–1.24)
Calcium: 9.3 mg/dL (ref 8.9–10.3)
Chloride: 103 mmol/L (ref 101–111)
Glucose, Bld: 122 mg/dL — ABNORMAL HIGH (ref 65–99)
POTASSIUM: 4.5 mmol/L (ref 3.5–5.1)
Sodium: 138 mmol/L (ref 135–145)
TOTAL PROTEIN: 7.1 g/dL (ref 6.5–8.1)

## 2017-07-27 LAB — RAPID URINE DRUG SCREEN, HOSP PERFORMED
Amphetamines: NOT DETECTED
Barbiturates: NOT DETECTED
Benzodiazepines: NOT DETECTED
Cocaine: NOT DETECTED
OPIATES: NOT DETECTED
Tetrahydrocannabinol: NOT DETECTED

## 2017-07-27 LAB — URINALYSIS, ROUTINE W REFLEX MICROSCOPIC
BILIRUBIN URINE: NEGATIVE
Glucose, UA: NEGATIVE mg/dL
HGB URINE DIPSTICK: NEGATIVE
KETONES UR: NEGATIVE mg/dL
Leukocytes, UA: NEGATIVE
Nitrite: NEGATIVE
Protein, ur: NEGATIVE mg/dL
Specific Gravity, Urine: 1.03 (ref 1.005–1.030)
pH: 5 (ref 5.0–8.0)

## 2017-07-27 LAB — ETHANOL: Alcohol, Ethyl (B): <10 mg/dL (ref <10)

## 2017-07-27 LAB — LIPASE, BLOOD: LIPASE: 31 U/L (ref 11–51)

## 2017-07-27 IMAGING — CT CT ABD-PELV W/ CM
2 of 5 series · 16 of 46 positions shown, 18 images · IV contrast (omnipaque)
Comparison: CT of the abdomen and pelvis performed [DATE]

CLINICAL DATA: Acute onset of vomiting.

EXAM:
CT ABDOMEN AND PELVIS WITH CONTRAST
TECHNIQUE: Multidetector CT imaging of the abdomen and pelvis was performed
using the standard protocol following bolus administration of
intravenous contrast.
CONTRAST:  100mL OMNIPAQUE IOHEXOL 300 MG/ML  SOLN

[Series 3: a/p w/ 5mm · axial · 0.74mm/px · z∈[-881,-416]mm · 13 of 105 slices shown, 15 images]
[im 6/105  soft-tissue]
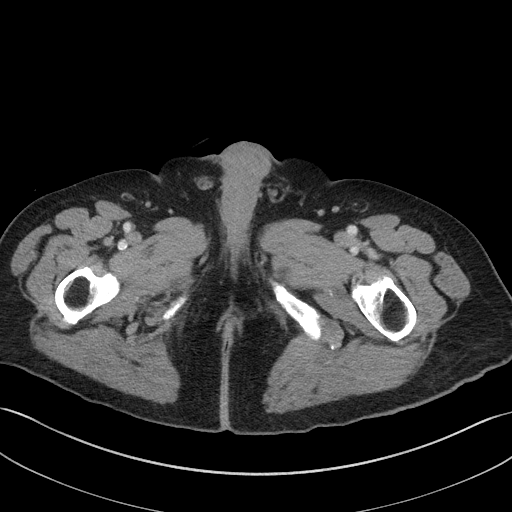
[im 6/105  bone]
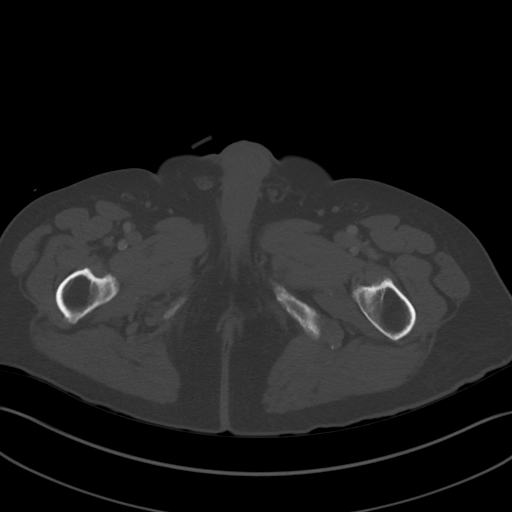
[im 16/105  soft-tissue]
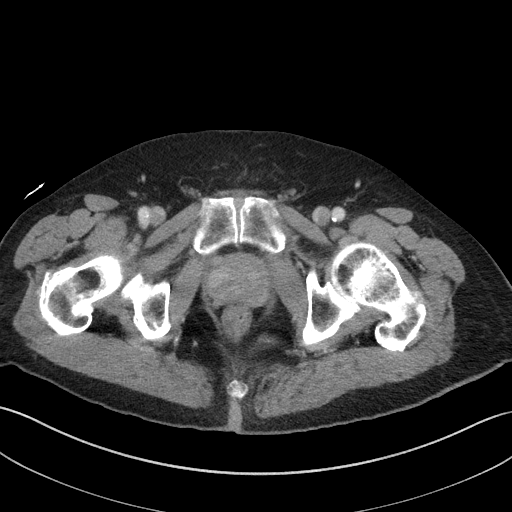
[im 21/105  soft-tissue]
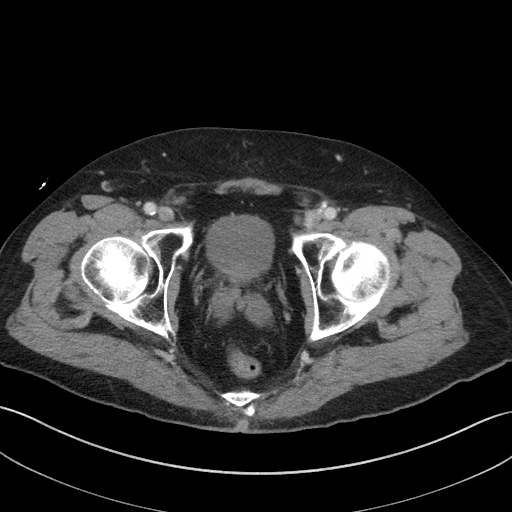
[im 32/105  soft-tissue]
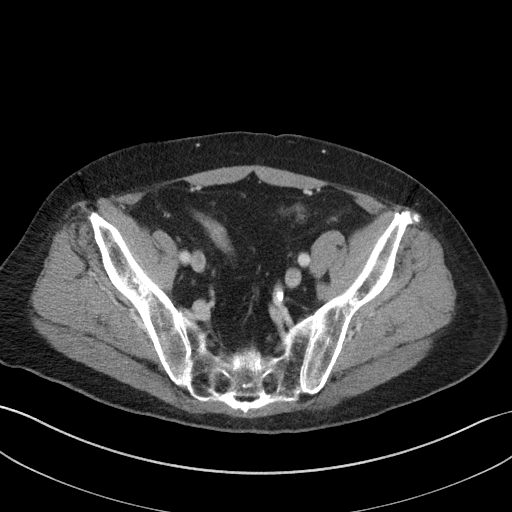
[im 37/105  soft-tissue]
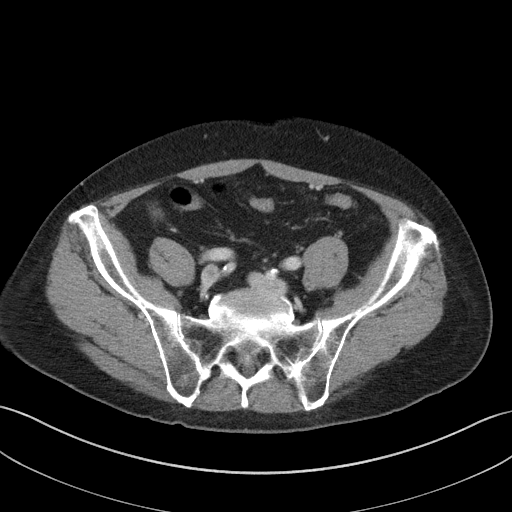
[im 47/105  soft-tissue]
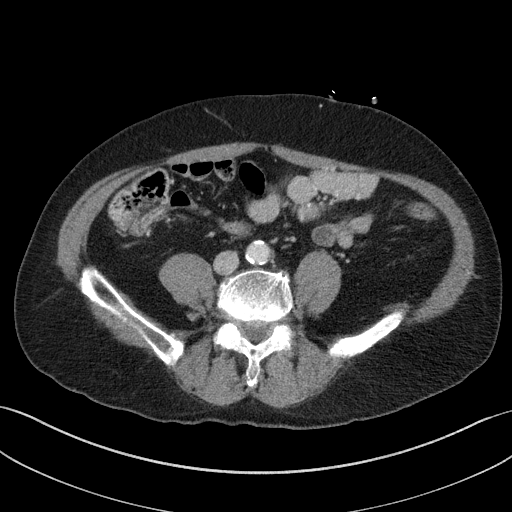
[im 53/105  soft-tissue]
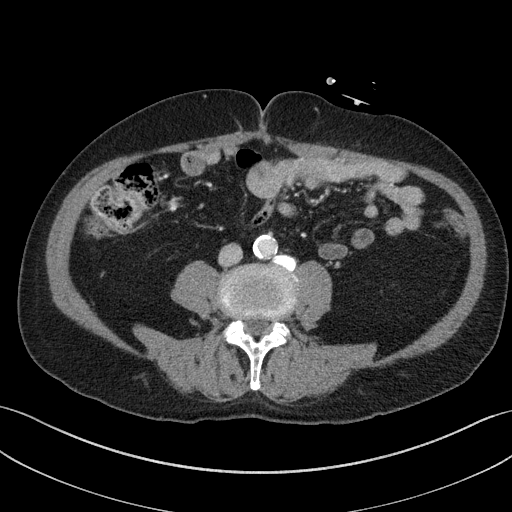
[im 58/105  soft-tissue]
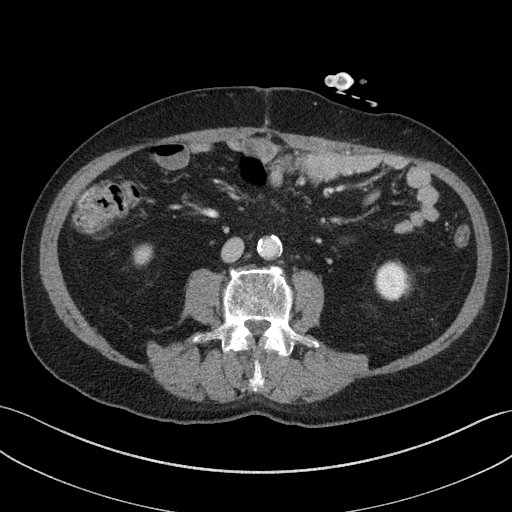
[im 68/105  soft-tissue]
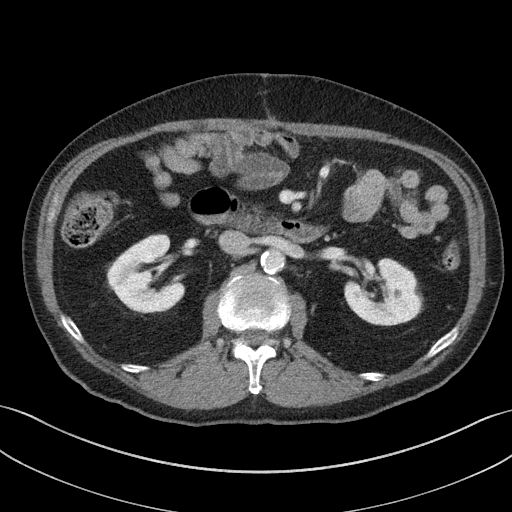
[im 68/105  bone]
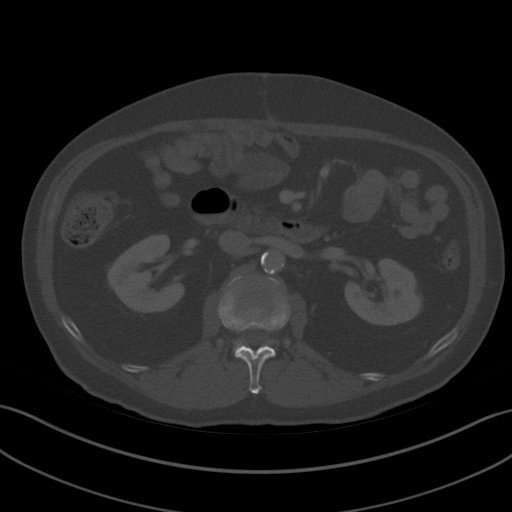
[im 73/105  soft-tissue]
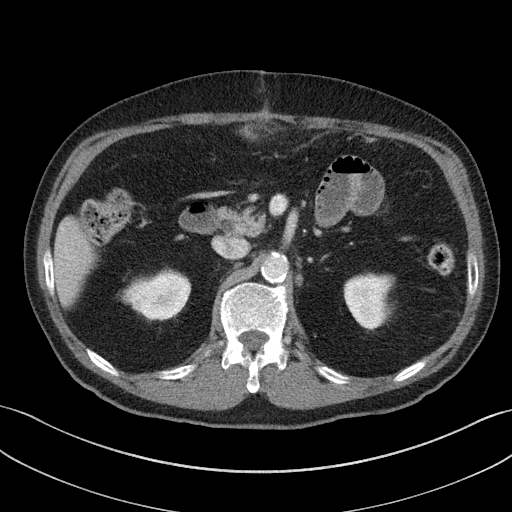
[im 84/105  soft-tissue]
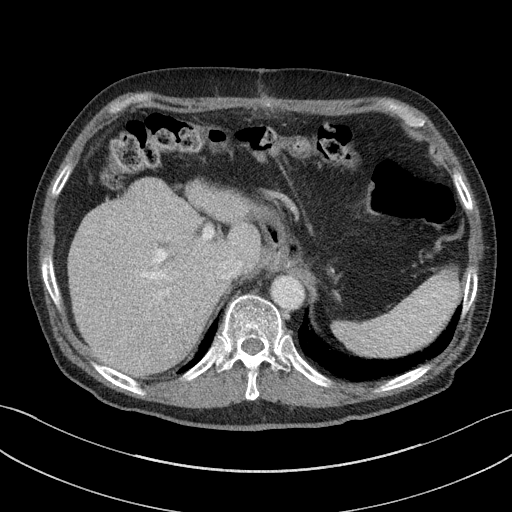
[im 89/105  soft-tissue]
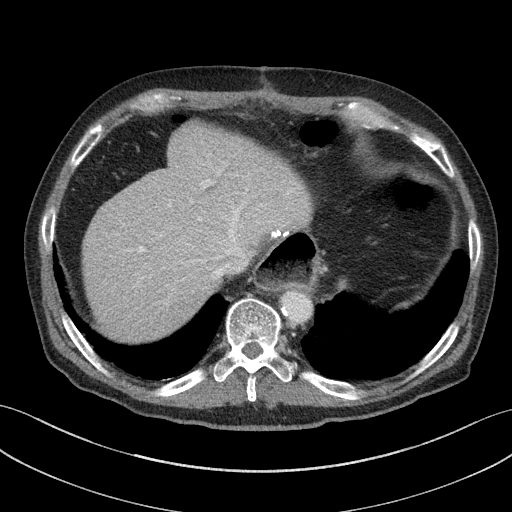
[im 99/105  soft-tissue]
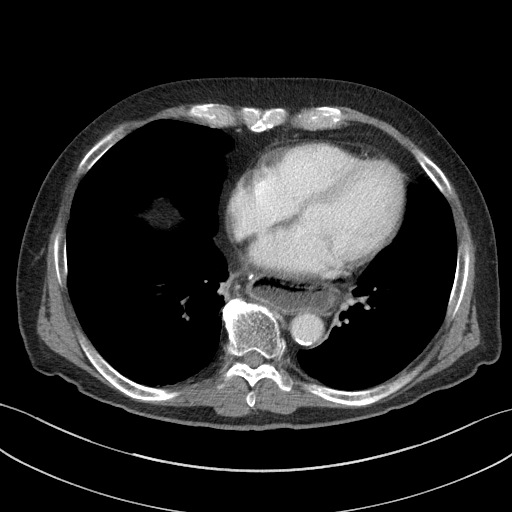

[Series 6: a/p w/ cor · coronal · 0.82mm/px · 3 of 120 slices shown]
[im 40/120  soft-tissue]
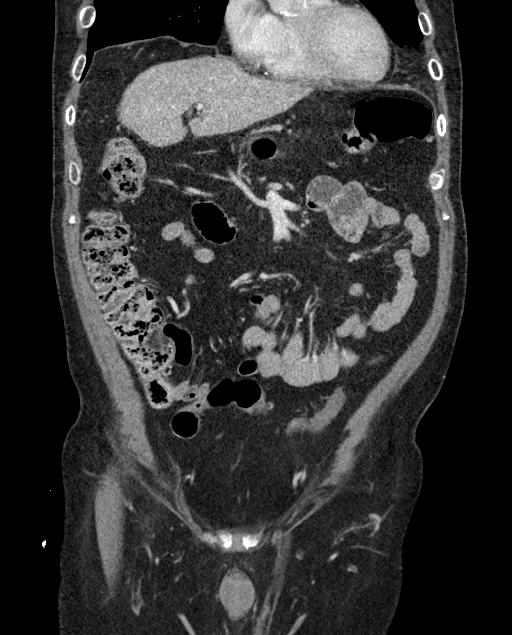
[im 53/120  soft-tissue]
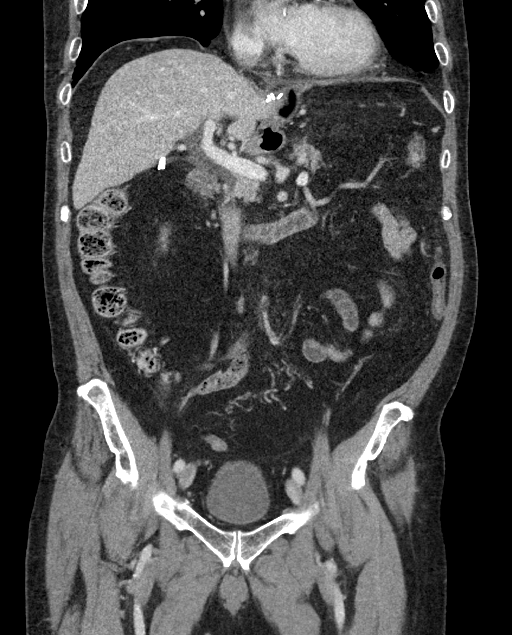
[im 67/120  soft-tissue]
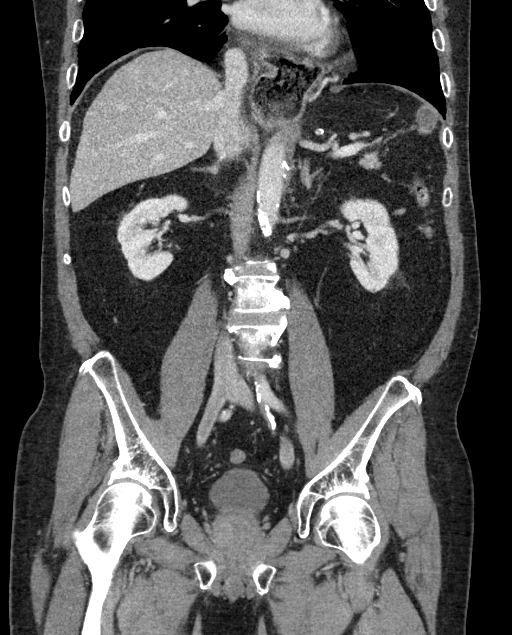

[16 of 46 positions shown; findings below may reference images not displayed]

FINDINGS: Lower chest: Minimal bibasilar atelectasis or scarring is noted.
Scattered coronary artery calcifications are seen. The visualized
portions of the patient's gastric pull-through are grossly
unremarkable in appearance, containing a small amount of solid and
fluid material.

Hepatobiliary: The liver is unremarkable in appearance. The patient
is status post cholecystectomy, with clips noted at the gallbladder
fossa. The common bile duct remains normal in caliber.

Pancreas: The pancreas is within normal limits.

Spleen: A few scattered calcifications are noted within the spleen.
A small splenic cyst is noted at the anterior aspect of the spleen.

Adrenals/Urinary Tract: The adrenal glands are unremarkable.

Nonspecific perinephric stranding is noted bilaterally. There is no
evidence of hydronephrosis. No renal or ureteral stones are
identified.

Stomach/Bowel: The stomach is unremarkable in appearance.

A J-tube is noted at the left mid abdomen. The small bowel is
unremarkable in appearance. The appendix is normal in caliber,
without evidence of appendicitis. The colon is unremarkable in
appearance.

Vascular/Lymphatic: Scattered calcification is seen along the
abdominal aorta and its branches. The abdominal aorta is otherwise
grossly unremarkable. The inferior vena cava is grossly
unremarkable. No retroperitoneal lymphadenopathy is seen. No pelvic
sidewall lymphadenopathy is identified.

Reproductive: The bladder is mildly distended and grossly
unremarkable. The prostate is borderline enlarged.

Other: No additional soft tissue abnormalities are seen.

Musculoskeletal: No acute osseous abnormalities are identified. The
visualized musculature is unremarkable in appearance.
IMPRESSION: 1. No acute abnormality seen to explain the patient's symptoms.
Visualized portions of the gastric pull-through are grossly
unremarkable.
2. Scattered coronary artery calcifications seen.
3. Small splenic cyst noted.

Aortic Atherosclerosis ([1P]-[1P]).

## 2017-07-27 IMAGING — CT CT HEAD W/O CM
4 series · 16 of 47 positions shown, 18 images · non-contrast
Comparison: None.

CLINICAL DATA: Dizziness, nausea.

EXAM:
CT HEAD WITHOUT CONTRAST
TECHNIQUE: Contiguous axial images were obtained from the base of the skull
through the vertex without intravenous contrast.

[Series 3: head without · axial · non-contrast · 0.42mm/px · z∈[-72,+43]mm · 7 of 31 slices shown, 9 images]
[im 4/31  brain]
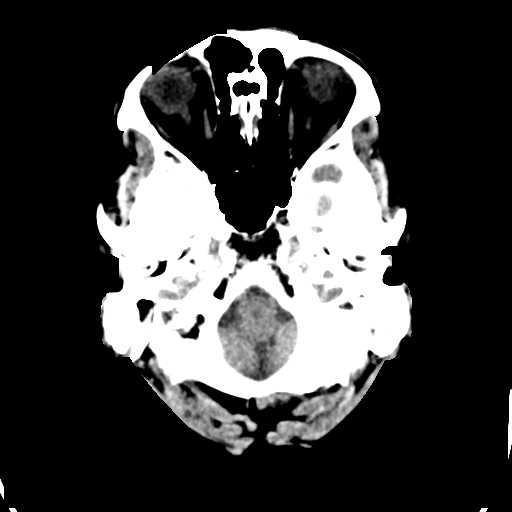
[im 4/31  bone]
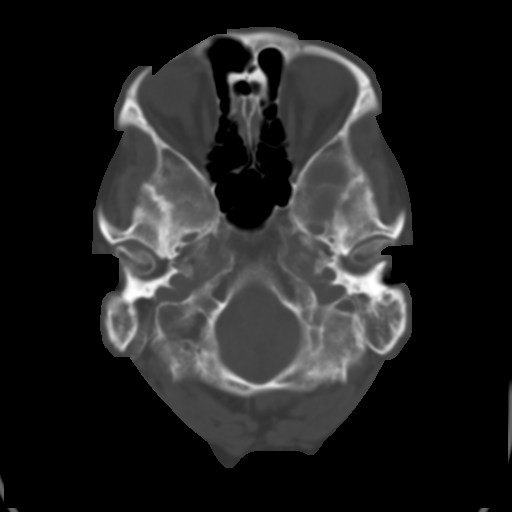
[im 8/31  brain]
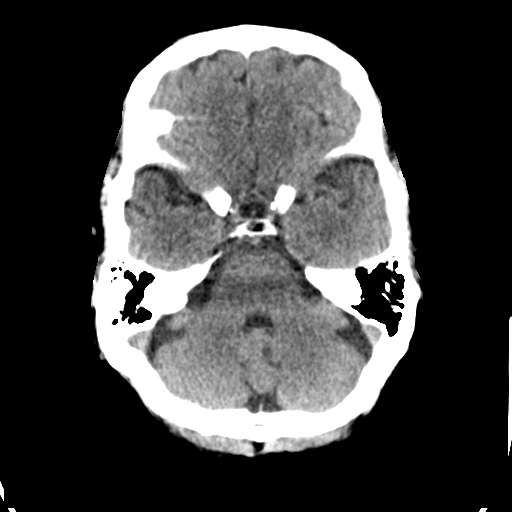
[im 12/31  brain]
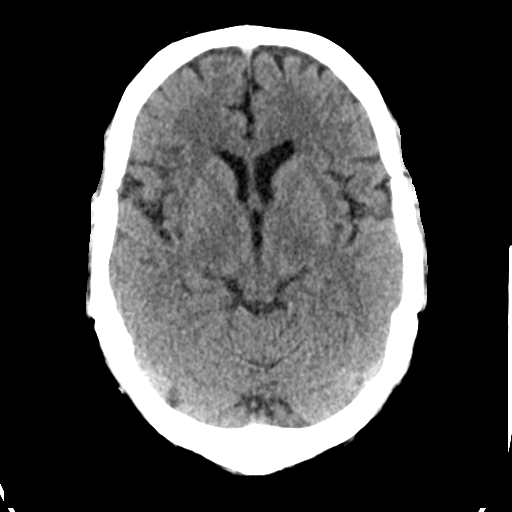
[im 16/31  brain]
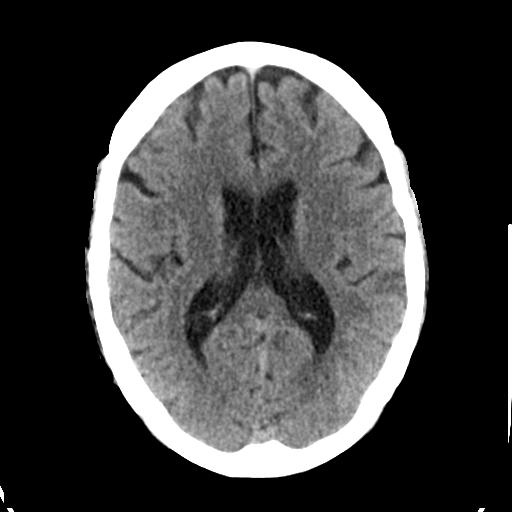
[im 19/31  brain]
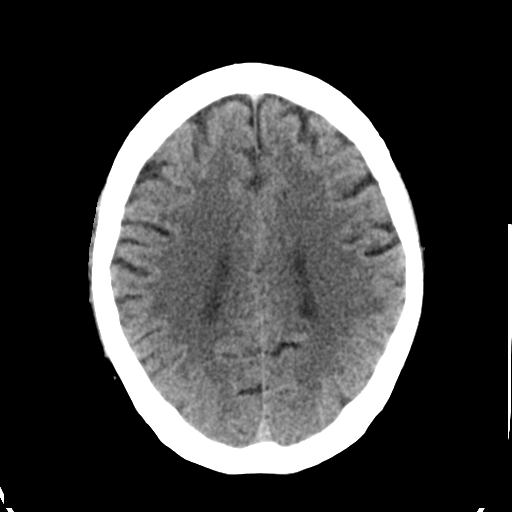
[im 19/31  bone]
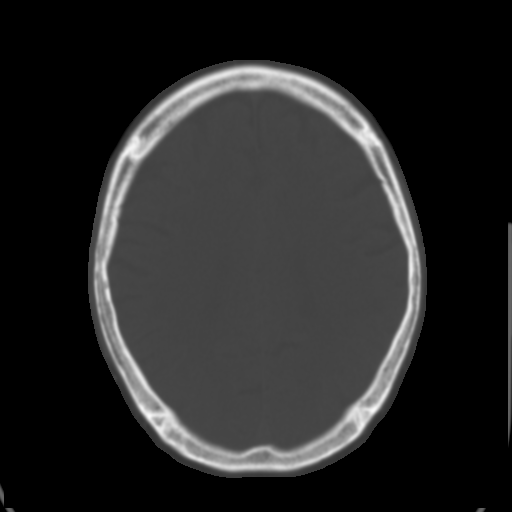
[im 23/31  brain]
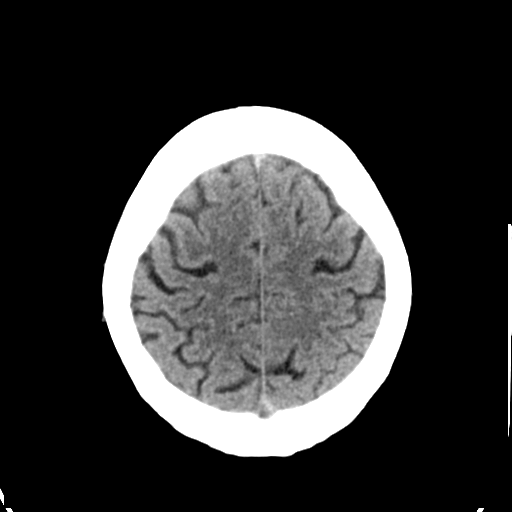
[im 27/31  brain]
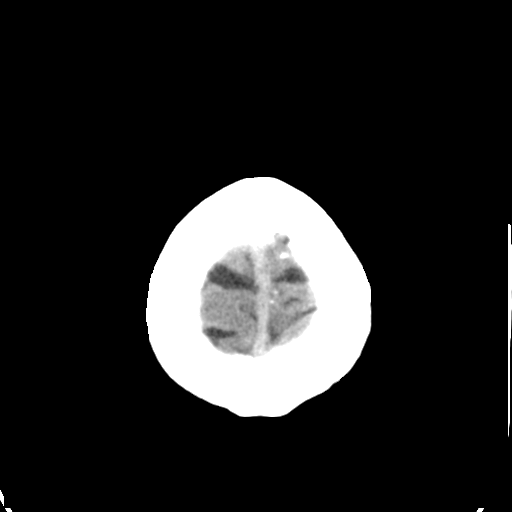

[Series 4: head bone · axial · 0.42mm/px · z∈[-73,-43]mm · 3 of 76 slices shown]
[im 8/76  bone]
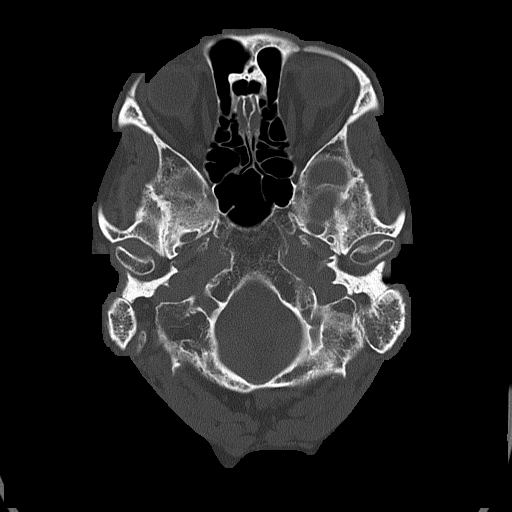
[im 16/76  bone]
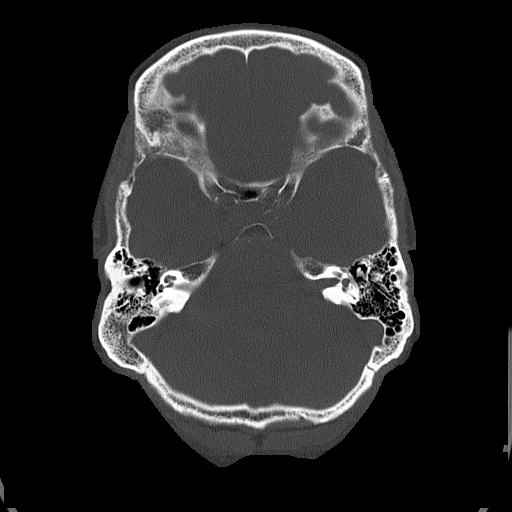
[im 23/76  bone]
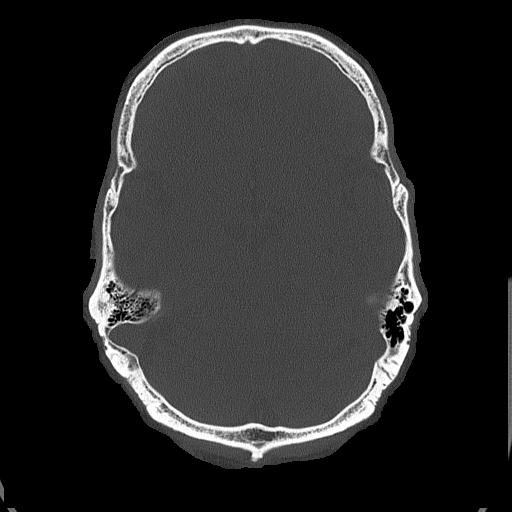

[Series 5: head without cor · coronal · non-contrast · 0.30mm/px · 3 of 68 slices shown]
[im 23/68  brain]
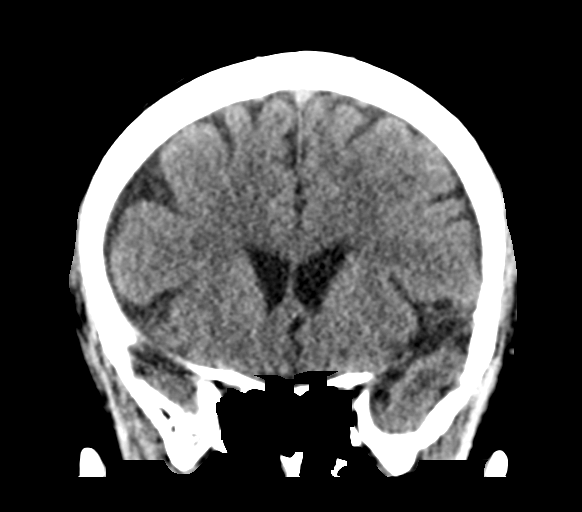
[im 30/68  brain]
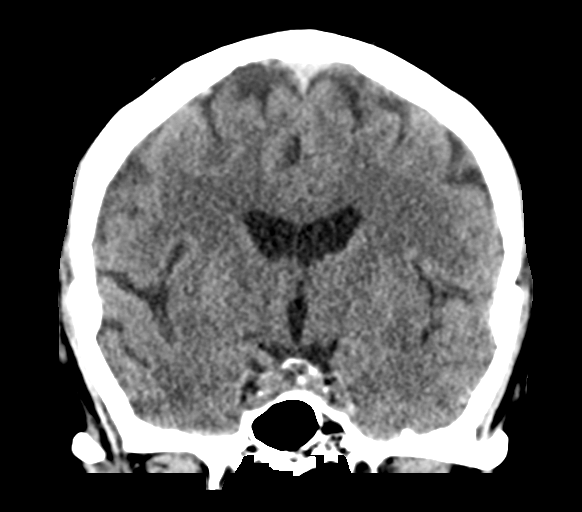
[im 38/68  brain]
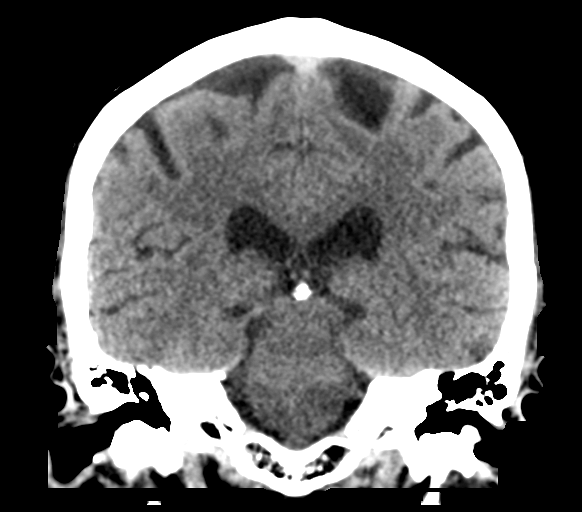

[Series 6: head without sag · sagittal · non-contrast · 0.32mm/px · 3 of 53 slices shown]
[im 18/53  brain]
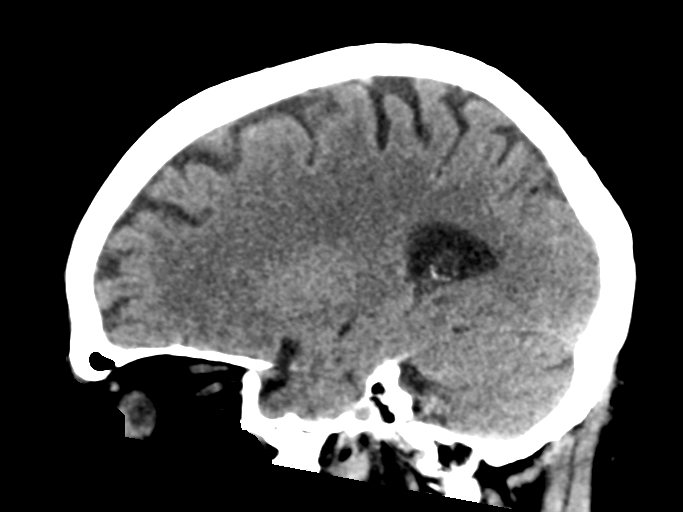
[im 27/53  brain]
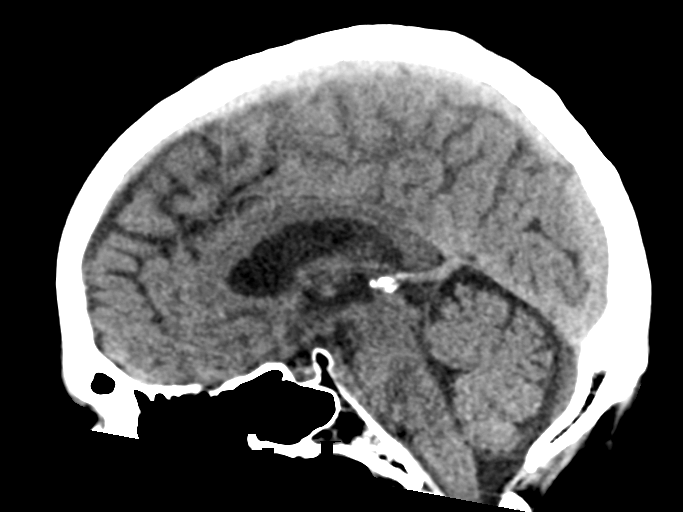
[im 35/53  brain]
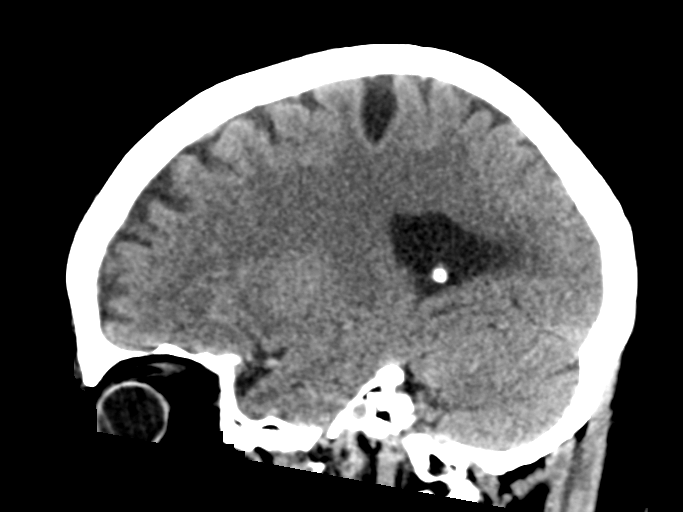

[16 of 47 positions shown; findings below may reference images not displayed]

FINDINGS: Brain: Ventricles are within normal limits in size and
configuration. There is no mass, hemorrhage, edema or other evidence
of acute parenchymal abnormality. No extra-axial hemorrhage.

Vascular: There are chronic calcified atherosclerotic changes of the
large vessels at the skull base. No unexpected hyperdense vessel.

Skull: Normal. Negative for fracture or focal lesion.

Sinuses/Orbits: No acute finding.

Other: None.
IMPRESSION: Negative head CT.  No intracranial mass, hemorrhage or edema.

## 2017-07-27 MED ORDER — IOHEXOL 300 MG/ML  SOLN
100.0000 mL | Freq: Once | INTRAMUSCULAR | Status: AC | PRN
  Administered 2017-07-27: 100 mL via INTRAVENOUS

## 2017-07-27 MED ORDER — ONDANSETRON 4 MG PO TBDP
4.0000 mg | ORAL_TABLET | Freq: Once | ORAL | Status: AC
Start: 2017-07-27 — End: 2017-07-27
  Administered 2017-07-27: 4 mg via ORAL
  Filled 2017-07-27: qty 1

## 2017-07-27 NOTE — ED Provider Notes (Signed)
Patient placed in Quick Look pathway, seen and evaluated   Chief Complaint: Dizziness, nausea  HPI:  77 y.o. male who is status post esophagectomy with jejunostomy in place who presents the emergent department today for dizziness, nausea and vomiting.  Patient states that over the last 3 days he has been having episodes where he feels lightheaded, blurring of his vision as well as associated nausea and emesis.  He states he has occasional diffuse abdominal pain with this.  He is afraid this may be a completion of his surgery he had in April. He states he has not had a BM in 2 days. He is still passing occasional gas.  He denies any fever, chills, chest pain, sob, vertigo, headache, vision loss, facial droop, aphasia, focal weakness, difficulty with gait.   ROS:  Positive ROS: (+) Dizziness, nausea and emesis. Abdominal pain.  Negative ROS: (-) Fever, chills, chest pain, shortness of breath, headache, vision loss, facial droop, aphasia, focal weakness or difficulty with gait  Physical Exam:   Gen: No distress  Neuro: Awake and Alert  Skin: Warm  Neuro: Speech clear. Follows commands. No facial droop. PERRLA. EOM  grossly intact. CN III-XII grossly intact. Grossly moves all extremities 4 without  ataxia. Able and appropriate strength for age to upper and lower extremities  bilaterally including grip strength. Normal finger to nose. No pronator drift.   Focused Exam: Heart RRR, nml S1,S2, no m/r/g; Lungs CTAB; Abd soft, NT, no rebound or guarding. Patient with no surrounding infection around prior abdominal incisional sites. No leakage or signs of surrounding infection around surronding jejunostomy tube; Ext 2+ pedal pulses bilaterally, no edema.  BP 114/79 (BP Location: Right Arm)   Pulse 93   Temp 98.8 F (37.1 C) (Oral)   Resp 16   SpO2 96%   Plan: Based on initial evaluation, labs ARE indicated and radiology studies ARE indicated.  Patient counseled on process, plan, and necessity for  staying for completing the evaluation."  Initiation of care has begun. The patient has been counseled on the process, plan, and necessity for staying for the completion/evaluation, and the remainder of the medical screening examination    Lorelle Gibbs 07/27/17 [place], Sam, MD 08/01/17 [phone]

## 2017-07-27 NOTE — ED Triage Notes (Signed)
Pt c/o dizziness, nausea for the past couple days. Pt states "im severely dehydrated". Pt had surgery in April of this year due to cancer with g tube placement. No neuro deficits noted in triage. Pt a.o

## 2017-07-27 NOTE — Progress Notes (Signed)
Casey Acevedo called the office around 3:30 and left a message saying that her husband has passed out x 2 today, has been vomiting and can't stand up. I called her back. He is s/p Esophagectomy by Dr. Lanelle Bal on 06/18/17. I suggested he go to the [facility] ED and she agreed. I notified Dr. Servando Snare as a courtesy since he is CALL this weekend for our practice.

## 2017-07-28 ENCOUNTER — Other Ambulatory Visit: Payer: Self-pay | Admitting: Cardiothoracic Surgery

## 2017-07-28 MED ORDER — ONDANSETRON HCL 4 MG/5ML PO SOLN: 4.0000 mg | Freq: Three times a day (TID) | ORAL | 0 refills | Status: AC | PRN

## 2017-07-28 NOTE — ED Notes (Signed)
Pt's wife and pt stated they are ready to go home and would like his IV line out.  Writer encourage pt to stay but pt is determined to go home and get back on his feeding tube

## 2017-07-28 NOTE — Progress Notes (Unsigned)
Patient seen in ER last night , ct of abdomen was done, no chest, no evidence of abstruction Was not given any nausesa meds Called back today zofran ordered Resume tube feeding also started  Grace Isaac MD      [address] [place] [address] Office [phone]   [place]

## 2017-08-10 ENCOUNTER — Inpatient Hospital Stay: Payer: Medicare Other | Attending: Oncology

## 2017-08-10 DIAGNOSIS — Z452 Encounter for adjustment and management of vascular access device: Secondary | ICD-10-CM | POA: Diagnosis present

## 2017-08-10 DIAGNOSIS — Z95828 Presence of other vascular implants and grafts: Secondary | ICD-10-CM

## 2017-08-10 DIAGNOSIS — C155 Malignant neoplasm of lower third of esophagus: Secondary | ICD-10-CM | POA: Diagnosis not present

## 2017-08-10 MED ORDER — HEPARIN SOD (PORK) LOCK FLUSH 100 UNIT/ML IV SOLN
500.0000 [IU] | Freq: Once | INTRAVENOUS | Status: AC
  Administered 2017-08-10: 500 [IU] via INTRAVENOUS
  Filled 2017-08-10: qty 5

## 2017-08-10 MED ORDER — SODIUM CHLORIDE 0.9% FLUSH
10.0000 mL | Freq: Once | INTRAVENOUS | Status: AC
  Administered 2017-08-10: 10 mL via INTRAVENOUS
  Filled 2017-08-10: qty 10

## 2017-08-16 ENCOUNTER — Encounter: Payer: Self-pay | Admitting: Cardiothoracic Surgery

## 2017-08-16 ENCOUNTER — Ambulatory Visit (INDEPENDENT_AMBULATORY_CARE_PROVIDER_SITE_OTHER): Payer: Self-pay | Admitting: Cardiothoracic Surgery

## 2017-08-16 ENCOUNTER — Other Ambulatory Visit: Payer: Self-pay

## 2017-08-16 VITALS — BP 119/77 | HR 85 | Resp 18 | Ht 69.0 in | Wt 176.8 lb

## 2017-08-16 DIAGNOSIS — C155 Malignant neoplasm of lower third of esophagus: Secondary | ICD-10-CM

## 2017-08-16 DIAGNOSIS — Z9889 Other specified postprocedural states: Secondary | ICD-10-CM

## 2017-08-16 DIAGNOSIS — Z9049 Acquired absence of other specified parts of digestive tract: Secondary | ICD-10-CM

## 2017-08-16 NOTE — Progress Notes (Signed)
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 SURGICAL PROCEDURES:  Video bronchoscopy, transhiatal total esophagectomy with cervical esophagogastrostomy, pyloromyotomy. SURGEON:  Lanelle Bal, MD.   Cancer Staging Malignant neoplasm of lower third of esophagus [facility]) Staging form: Esophagus - Adenocarcinoma, AJCC 8th Edition - Clinical stage from 01/04/2017: Stage Unknown (cTX, cN0, cM0) - Signed by Sindy Guadeloupe, MD on 01/04/2017 - Pathologic stage from 06/22/2017: Stage Unknown (ypTX, pN1, cM0, GX) - Signed by Grace Isaac, MD on 06/22/2017   History of Present Illness:     Patient returns today feeling much better, in fact looks and feels better than he has had any other time of seen him including preop.  He had a miserable experience in the emergency room several weeks ago after he had a near syncopal episode at home, little was done for him in the emergency room.   He is now taking a p.o. diet without difficulty including solid food.  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  His weight is been stable   Final path: Diagnosis 1. Lymph node, biopsy, omental - FIBROADIPOSE TISSUE - NO MALIGNANCY IDENTIFIED 2. 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 An oncology table was not completed because there was no residual carcinoma identified in esophagus consistent with a complete response (Score 0) to neoadjuvant therapy. Based on the current specimen, this would be staged as ypT0 ypN1. Dr. Saralyn Pilar reviewed the case and agrees with the above diagnosis. DAWN BUTLER MD  Wt Readings from Last 3 Encounters:  08/16/17 176 lb 12.8 oz (80.2 kg)  07/19/17 178 lb (80.7 kg)  06/28/17 177 lb (80.3 kg)    Zubrod Score: At the time of surgery this patient's most appropriate activity status/level should be described as: []     0    Normal activity, no symptoms [x]     1    Restricted in physical strenuous activity but ambulatory, able to do out light work []     2    Ambulatory and capable of self care, unable to do work activities, up and about                 >50 % of waking hours                                                                                   []   3    Only limited self care, in bed greater than 50% of waking hours []     4    Completely disabled, no self care, confined to bed or chair []     5    Moribund  Social History   Tobacco Use  Smoking Status Never Smoker  Smokeless Tobacco Never Used       No Known Allergies  Current Outpatient Medications  Medication Sig Dispense Refill  . loperamide (IMODIUM) 2 MG capsule Take 1 capsule (2 mg total) by mouth as needed for diarrhea or loose stools (take two tablests after the first loose stool, then 1 tablet after each loose stool). (Patient not taking: Reported on 08/16/2017)    . Nutritional Supplements (FEEDING SUPPLEMENT, VITAL 1.5 CAL,) LIQD Give vital 1.5 via continuous pump at 39ml/hr for 22 hours.  Flush with 236ml of water before starting tube feeding and after stopping tube feeding.  Provide additional 257ml of water 2 other times during waking hours via J tube. (Patient not taking: Reported on 08/16/2017) 1540 mL 12     No current facility-administered medications for this visit.    Facility-Administered Medications Ordered in Other Visits  Medication Dose Route Frequency Provider Last Rate Last Dose  . 0.9 %  sodium chloride infusion   Intravenous Once Sindy Guadeloupe, MD      . ondansetron (ZOFRAN) 8 mg, dexamethasone (DECADRON) 10 mg in sodium chloride 0.9 % 50 mL IVPB   Intravenous Once Sindy Guadeloupe, MD           Physical Exam: BP 119/77 (BP Location: Right Arm, Patient Position: Sitting, Cuff Size: Normal)   Pulse 85   Resp 18   Ht 5\' 9"  (1.753 m)   Wt 176 lb 12.8 oz (80.2 kg)   SpO2 95% Comment: RA  BMI 26.11 kg/m   General appearance: alert, cooperative and no distress Neurologic: intact Heart: regular rate and rhythm, S1, S2 normal, no murmur, click, rub or gallop Lungs: clear to auscultation bilaterally Abdomen: soft, non-tender; bowel sounds normal; no masses,  no organomegaly and Feeding tube intact Extremities: extremities normal, atraumatic, no cyanosis or edema and Homans sign is negative, no sign of DVT Wound: Neck incision and abdominal incisions are healing well Wounds:  Diagnostic Studies & Laboratory data:         Recent Radiology Findings: No results found.    I have independently reviewed the above radiology findings and reviewed findings  with the patient.  Recent Labs: Lab Results  Component Value Date   WBC 7.9 07/27/2017   HGB 14.6 07/27/2017   HCT 45.1 07/27/2017   PLT 270 07/27/2017   GLUCOSE 122 (H) 07/27/2017   TRIG 92 12/11/2016   ALT 18 07/27/2017   AST 15 07/27/2017   NA 138 07/27/2017   K 4.5 07/27/2017   CL 103 07/27/2017   CREATININE 0.89 07/27/2017   BUN 19 07/27/2017   CO2 25 07/27/2017   INR 1.00 06/14/2017      Assessment / Plan:      Patient making good progress following transhiatal total esophagectomy now approximately 8 weeks postop Yesterday with was the first day he had stopped using tube feedings and taken has not oral  nutrition completely by mouth, his weight has been stable, if he continues making progress for another week we will consider removing his feeding jejunostomy tube.  He will continue to check his weight and return to the office in 1 week  Grace Isaac 08/16/2017 3:18 PM

## 2017-08-22 ENCOUNTER — Other Ambulatory Visit: Payer: Self-pay

## 2017-08-22 ENCOUNTER — Ambulatory Visit (INDEPENDENT_AMBULATORY_CARE_PROVIDER_SITE_OTHER): Payer: Self-pay | Admitting: Cardiothoracic Surgery

## 2017-08-22 ENCOUNTER — Encounter: Payer: Self-pay | Admitting: Cardiothoracic Surgery

## 2017-08-22 VITALS — BP 110/72 | HR 91 | Resp 18 | Ht 69.0 in | Wt 176.0 lb

## 2017-08-22 DIAGNOSIS — Z09 Encounter for follow-up examination after completed treatment for conditions other than malignant neoplasm: Secondary | ICD-10-CM

## 2017-08-22 DIAGNOSIS — C155 Malignant neoplasm of lower third of esophagus: Secondary | ICD-10-CM

## 2017-08-22 NOTE — Progress Notes (Signed)
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 SURGICAL PROCEDURES:  Video bronchoscopy, transhiatal total esophagectomy with cervical esophagogastrostomy, pyloromyotomy. SURGEON:  Lanelle Bal, MD.   Cancer Staging Malignant neoplasm of lower third of esophagus [facility]) Staging form: Esophagus - Adenocarcinoma, AJCC 8th Edition - Clinical stage from 01/04/2017: Stage Unknown (cTX, cN0, cM0) - Signed by Sindy Guadeloupe, MD on 01/04/2017 - Pathologic stage from 06/22/2017: Stage Unknown (ypTX, pN1, cM0, GX) - Signed by Grace Isaac, MD on 06/22/2017   History of Present Illness:     Patient returns to the office after transhiatal total esophagectomy done on April 8.  The patient is now making good progress postoperatively.  He has not used his jejunostomy feeding tube for several weeks.  He is taking a p.o. diet without difficulty.  He has learned to avoid dairy products and sweets.  He denies any reflux symptoms.  He has been maintaining his weight without jejunal tube feedings.  Final path: Diagnosis 1. Lymph node, biopsy, omental - FIBROADIPOSE TISSUE - NO MALIGNANCY IDENTIFIED 2. 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 An oncology table was not completed because there was no residual carcinoma identified in esophagus consistent with a complete response (Score 0) to neoadjuvant therapy. Based on the current specimen, this would be staged as ypT0 ypN1. Dr. Saralyn Pilar reviewed the case and agrees with the above diagnosis. DAWN BUTLER MD  Wt Readings from Last 3 Encounters:  08/22/17 176 lb (79.8 kg)  08/16/17 176 lb 12.8 oz (80.2 kg)  07/19/17 178 lb (80.7 kg)    Zubrod Score: At the time of surgery this patient's most appropriate activity status/level should be described as: []     0    Normal activity, no symptoms [x]     1    Restricted in physical strenuous activity but ambulatory, able to do out light work []     2    Ambulatory and capable of self care, unable to do work activities, up and about                 >50 % of waking hours                                                                                   []     3  Only limited self care, in bed greater than 50% of waking hours []     4    Completely disabled, no self care, confined to bed or chair []     5    Moribund  Social History   Tobacco Use  Smoking Status Never Smoker  Smokeless Tobacco Never Used       No Known Allergies  Current Outpatient Medications  Medication Sig Dispense Refill  . loperamide (IMODIUM) 2 MG capsule Take 1 capsule (2 mg total) by mouth as needed for diarrhea or loose stools (take two tablests after the first loose stool, then 1 tablet after each loose stool).    . Nutritional Supplements (FEEDING SUPPLEMENT, VITAL 1.5 CAL,) LIQD Give vital 1.5 via continuous pump at 46ml/hr for 22 hours.  Flush with 272ml of water before starting tube feeding and after stopping tube feeding.  Provide additional 22ml of water 2 other times during waking hours via J tube. 1540 mL 12   No current facility-administered medications for this visit.     Facility-Administered Medications Ordered in Other Visits  Medication Dose Route Frequency Provider Last Rate Last Dose  . 0.9 %  sodium chloride infusion   Intravenous Once Sindy Guadeloupe, MD      . ondansetron (ZOFRAN) 8 mg, dexamethasone (DECADRON) 10 mg in sodium chloride 0.9 % 50 mL IVPB   Intravenous Once Sindy Guadeloupe, MD           Physical Exam: BP 110/72 (BP Location: Right Arm, Patient Position: Sitting, Cuff Size: Normal)   Pulse 91   Resp 18   Ht 5\' 9"  (1.753 m)   Wt 176 lb (79.8 kg)   SpO2 95% Comment: RA  BMI 25.99 kg/m  General appearance: alert and cooperative Head: Normocephalic, without obvious abnormality, atraumatic Neck: no adenopathy, no carotid bruit, no JVD, supple, symmetrical, trachea midline and thyroid not enlarged, symmetric, no tenderness/mass/nodules Lymph nodes: Cervical, supraclavicular, and axillary nodes normal. Resp: clear to auscultation bilaterally Cardio: regular rate and rhythm, S1, S2 normal, no murmur, click, rub or gallop GI: soft, non-tender; bowel sounds normal; no masses,  no organomegaly Extremities: extremities normal, atraumatic, no cyanosis or edema and Homans sign is negative, no sign of DVT Neurologic: Grossly normal  Diagnostic Studies & Laboratory data:         Recent Radiology Findings: No results found.   Recent Labs: Lab Results  Component Value Date   WBC 7.9 07/27/2017   HGB 14.6 07/27/2017   HCT 45.1 07/27/2017   PLT 270 07/27/2017   GLUCOSE 122 (H) 07/27/2017   TRIG 92 12/11/2016   ALT 18 07/27/2017   AST 15 07/27/2017   NA 138 07/27/2017   K 4.5 07/27/2017   CL 103 07/27/2017   CREATININE 0.89 07/27/2017   BUN 19 07/27/2017   CO2 25 07/27/2017   INR 1.00 06/14/2017      Assessment / Plan:      Patient doing well following transhiatal total esophagectomy, no residual tumor was noted in the esophagus patient did have one node involved.  His feeding jejunostomy tube which is no longer using was  removed in the office today.  He was instructed in local wound care Has a follow-up appointment in medical oncology tomorrow I will plan to see him back in 2 months    Grace Isaac 08/22/2017 3:39 PM

## 2017-08-23 ENCOUNTER — Inpatient Hospital Stay: Payer: Medicare Other

## 2017-08-23 ENCOUNTER — Inpatient Hospital Stay: Payer: Medicare Other | Attending: Oncology | Admitting: Oncology

## 2017-08-23 ENCOUNTER — Encounter: Payer: Self-pay | Admitting: Oncology

## 2017-08-23 VITALS — BP 119/81 | HR 79 | Temp 97.0°F | Resp 18 | Ht 69.0 in | Wt 179.5 lb

## 2017-08-23 DIAGNOSIS — Z9889 Other specified postprocedural states: Secondary | ICD-10-CM

## 2017-08-23 DIAGNOSIS — Z9049 Acquired absence of other specified parts of digestive tract: Secondary | ICD-10-CM | POA: Diagnosis not present

## 2017-08-23 DIAGNOSIS — C155 Malignant neoplasm of lower third of esophagus: Secondary | ICD-10-CM | POA: Diagnosis not present

## 2017-08-23 DIAGNOSIS — Z87891 Personal history of nicotine dependence: Secondary | ICD-10-CM | POA: Diagnosis not present

## 2017-08-23 DIAGNOSIS — Z923 Personal history of irradiation: Secondary | ICD-10-CM | POA: Insufficient documentation

## 2017-08-23 DIAGNOSIS — Z9221 Personal history of antineoplastic chemotherapy: Secondary | ICD-10-CM | POA: Insufficient documentation

## 2017-08-23 LAB — CBC WITH DIFFERENTIAL/PLATELET
BASOS PCT: 1 %
Basophils Absolute: 0.1 10*3/uL (ref 0–0.1)
EOS ABS: 0.1 10*3/uL (ref 0–0.7)
EOS PCT: 1 %
HEMATOCRIT: 40.2 % (ref 40.0–52.0)
Hemoglobin: 13.4 g/dL (ref 13.0–18.0)
Lymphocytes Relative: 34 %
Lymphs Abs: 1.8 10*3/uL (ref 1.0–3.6)
MCH: 28 pg (ref 26.0–34.0)
MCHC: 33.4 g/dL (ref 32.0–36.0)
MCV: 84 fL (ref 80.0–100.0)
MONO ABS: 0.5 10*3/uL (ref 0.2–1.0)
Monocytes Relative: 11 %
Neutro Abs: 2.7 10*3/uL (ref 1.4–6.5)
Neutrophils Relative %: 53 %
Platelets: 210 10*3/uL (ref 150–440)
RBC: 4.79 MIL/uL (ref 4.40–5.90)
RDW: 18.5 % — AB (ref 11.5–14.5)
WBC: 5.1 10*3/uL (ref 3.8–10.6)

## 2017-08-23 LAB — COMPREHENSIVE METABOLIC PANEL
ALBUMIN: 4 g/dL (ref 3.5–5.0)
ALT: 19 U/L (ref 17–63)
AST: 12 U/L — ABNORMAL LOW (ref 15–41)
Alkaline Phosphatase: 81 U/L (ref 38–126)
Anion gap: 7 (ref 5–15)
BUN: 17 mg/dL (ref 6–20)
CALCIUM: 8.9 mg/dL (ref 8.9–10.3)
CO2: 21 mmol/L — AB (ref 22–32)
CREATININE: 0.92 mg/dL (ref 0.61–1.24)
Chloride: 112 mmol/L — ABNORMAL HIGH (ref 101–111)
GFR calc Af Amer: >60 mL/min (ref >60)
GFR calc non Af Amer: >60 mL/min (ref >60)
GLUCOSE: 104 mg/dL — AB (ref 65–99)
Potassium: 3.7 mmol/L (ref 3.5–5.1)
SODIUM: 140 mmol/L (ref 135–145)
Total Bilirubin: 0.9 mg/dL (ref 0.3–1.2)
Total Protein: 7 g/dL (ref 6.5–8.1)

## 2017-08-23 NOTE — Progress Notes (Signed)
No new changes noted today 

## 2017-08-23 NOTE — Progress Notes (Signed)
Nutrition Follow-up:  Patient with esophageal cancer.  Patient s/p esophagectomy.  Noted MD removed J-tube yesterday in office as patient has been able to maintain weight and hydration without using tube.   Met with patient and wife today following MD visit.  Patient reports that he is eating well 5 meals per day.  Reports that he does have issues with reflux. 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  Reports has been eating eggs, chicken salad, cottage cheese and fruit, meats, planning vegetable soup tonight for dinner.  Has been drinking 1 orgain clean protein shake without difficulty (140 calories and 20 g of protein).    Medications: reviewed  Labs: reviewed  Anthropometrics:   Weight 179 lb 8 oz today in clinic.  Patient typical weight 180 lb   NUTRITION DIAGNOSIS: Inadequate oral intake improved   MALNUTRITION DIAGNOSIS: resolved   INTERVENTION:   Encouraged patient to continue to eat small frequent meals.  Encouraged good sources of protein and low sugar foods as can contribute to dumping syndrome. Patient concerned that he might not be getting enough calories daily.  Encouraged to monitor weight.  Reviewed calories needed per day and how to monitor calorie intake.   Patient can increase intake of shake if additional calories needed.      MONITORING, EVALUATION, GOAL: weight trends, intake   NEXT VISIT: as needed, patient has contact information  Maille Halliwell B. Zenia Resides, [place], [place] Registered Dietitian [phone] (pager)

## 2017-08-25 NOTE — Progress Notes (Signed)
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 ypT0ypN1   Chief complaint/ Reason for visit- discuss pathology results and further management  Heme/Onc history: 1. Patient is a 77 year old gentleman with no significant past medical history and takes no medications he has a remote history of smokingduring his college days and quit smoking thereafter. No history of any alcohol intake. He was recently admitted to the hospital on 12/06/2016 with symptoms of progressive dysphagia which he states has been going on since the starting of September. His dysphagia got to the point that he began to have pain even on swallowing icecream. He underwent EGD at that time which showed but high 2 cm long stricture 2 cm above the GE junction. Biopsies at that time were negative for malignancy. Given that he was unable to eat he was started on TPN at that time and plan was to get a repeat EGD with possible biopsy  2. Patient underwent repeat EGD on 12/26/2016 where he was again noted to have a lower esophageal stricture which reduced the lumen to less than 3-4 mm. There was no evidence of Barrett's esophagus. 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 Shortly after the stricture was dilated the  stricture did close up significantly. Postprocedure patient complained of abdominal pain and there was a concern for perforation and hence a repeat CT abdomen was obtained which did not show any evidence of perforation  3. CT abdomen pelvis as well as CT chest with contrast on 10/01/2018showed: 7 mm sub pleural nodule in the lingula. No evidence of axillary mediastinal or hilar adenopathy.1.3 cm soft tissue nodule adjacent to the GE junctionwhich is favored to represent an enlarged lymph node potentially active or metastatic in etiology. Narrowing involving thickening of the distal esophagus compatible with known distal esophageal stricture  4. He lives with his wife and is independent of his ADLs and IADLs. Besides dysphagia patient reports no loss of appetite or unintentional weight loss over the last few months. He denies any pain  5.[place] has been deemed to be a potential surgical candidate in the future. Plan for now is to proceed with concurrent chemoradiation followed by EUS upon completion of treatment given difficulty with the  second EGD  6. Chemo/RT started on 11/9/18and completed on 03/01/17  7. Patient underwent transhiatal total esophagectomy with cervical esophagogastrostomy, pyloromyotomy in April 2019. Pathology showed no residual carcinoma at primary site. 1/5 LN positive for malignancy. 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 J tube is out. He is able to eat and rink normally. He has been cleared by DR. Gerhardt to perform moderate activities like lawn mowing. He feels great. Denies any complaints today  ECOG PS- 0 Pain scale- 0   Review of  systems- Review of Systems  Constitutional: Negative for chills, fever, malaise/fatigue and weight loss.  HENT: Negative for congestion, ear discharge and  nosebleeds.   Eyes: Negative for blurred vision.  Respiratory: Negative for cough, hemoptysis, sputum production, shortness of breath and wheezing.   Cardiovascular: Negative for chest pain, palpitations, orthopnea and claudication.  Gastrointestinal: Negative for abdominal pain, blood in stool, constipation, diarrhea, heartburn, melena, nausea and vomiting.  Genitourinary: Negative for dysuria, flank pain, frequency, hematuria and urgency.  Musculoskeletal: Negative for back pain, joint pain and myalgias.  Skin: Negative for rash.  Neurological: Negative for dizziness, tingling, focal weakness, seizures, weakness and headaches.  Endo/Heme/Allergies: Does not bruise/bleed easily.  Psychiatric/Behavioral: Negative for depression and suicidal ideas. The patient does not have insomnia.       No Known Allergies   Past Medical History:  Diagnosis Date  . Cancer ([place])    esophageal  . Dysphagia      Past Surgical History:  Procedure Laterality Date  . CHOLECYSTECTOMY    . COMPLETE ESOPHAGECTOMY N/A 06/18/2017   Procedure: cervical ESOPHAGECTOMY COMPLETE;  Surgeon: Grace Isaac, MD;  Location: [facility] OR;  Service: Thoracic;  Laterality: N/A;  NEED NIMS ET TUBE  . ESOPHAGOGASTRODUODENOSCOPY (EGD) WITH PROPOFOL N/A 12/07/2016   Procedure: ESOPHAGOGASTRODUODENOSCOPY (EGD) WITH PROPOFOL;  Surgeon: Jonathon Bellows, MD;  Location: [facility] ENDOSCOPY;  Service: Gastroenterology;  Laterality: N/A;  . ESOPHAGOGASTRODUODENOSCOPY (EGD) WITH PROPOFOL N/A 12/26/2016   Procedure: ESOPHAGOGASTRODUODENOSCOPY (EGD) WITH PROPOFOL WITH DILATION;  Surgeon: Jonathon Bellows, MD;  Location: [facility] ENDOSCOPY;  Service: Gastroenterology;  Laterality: N/A;  . EYE SURGERY    . GASTROJEJUNOSTOMY N/A 01/16/2017   Procedure: LAPROSCOPIC ASSIST FEEDING JEJUNOSTOMY TUBE;  Surgeon: Johnathan Hausen, MD;  Location: [facility] ORS;  Service: General;  Laterality: N/A;  . IR FLUORO GUIDE PORT INSERTION RIGHT  01/10/2017  . IR REPLACE G-TUBE  SIMPLE WO FLUORO  03/15/2017  . IR REPLC DUODEN/JEJUNO TUBE PERCUT W/FLUORO  03/05/2017  . PICC LINE INSERTION Right   . PYLOROMYOTOMY N/A 06/18/2017   Procedure: Edwena Blow;  Surgeon: Grace Isaac, MD;  Location: [place];  Service: Thoracic;  Laterality: N/A;  . VIDEO BRONCHOSCOPY N/A 06/18/2017   Procedure: VIDEO BRONCHOSCOPY;  Surgeon: Grace Isaac, MD;  Location: [facility] OR;  Service: Thoracic;  Laterality: N/A;    Social History   Socioeconomic History  . Marital status: Married    Spouse name: Not on file  . Number of children: Not on file  . Years of education: Not on file  . Highest education level: Not on file  Occupational History  . Not on file  Social Needs  . Financial resource strain: Not on file  . Food insecurity:    Worry: Not on file    Inability: Not on file  . Transportation needs:    Medical: Not on file    Non-medical: Not on file  Tobacco Use  . Smoking status: Never Smoker  . Smokeless tobacco: Never Used  Substance and Sexual Activity  . Alcohol use: No  . Drug use: No  . Sexual activity: Not Currently  Lifestyle  . Physical activity:    Days per week: Not on file    Minutes per session: Not on file  . Stress: Not on file  Relationships  . Social connections:    Talks on phone: Not on file    Gets together: Not on file    Attends religious service: Not on file    Active member of club or  organization: Not on file    Attends meetings of clubs or organizations: Not on file    Relationship status: Not on file  . Intimate partner violence:    Fear of current or ex partner: Not on file    Emotionally abused: Not on file    Physically abused: Not on file    Forced sexual activity: Not on file  Other Topics Concern  . Not on file  Social History Narrative   Independent at baseline. Ambulatory    Family History  Problem Relation Age of Onset  . Heart disease Father   . Diabetes Mother   . Hypertension Mother   . Heart attack Brother 23   . Prostate cancer Neg Hx   . Bladder Cancer Neg Hx   . Kidney cancer Neg Hx      Current Outpatient Medications:  .  loperamide (IMODIUM) 2 MG capsule, Take 1 capsule (2 mg total) by mouth as needed for diarrhea or loose stools (take two tablests after the first loose stool, then 1 tablet after each loose stool). (Patient not taking: Reported on 08/23/2017), Disp: , Rfl:  .  Nutritional Supplements (FEEDING SUPPLEMENT, VITAL 1.5 CAL,) LIQD, Give vital 1.5 via continuous pump at 88ml/hr for 22 hours.  Flush with 227ml of water before starting tube feeding and after stopping tube feeding.  Provide additional 276ml of water 2 other times during waking hours via J tube. (Patient not taking: Reported on 08/23/2017), Disp: 1540 mL, Rfl: 12 No current facility-administered medications for this visit.   Facility-Administered Medications Ordered in Other Visits:  .  0.9 %  sodium chloride infusion, , Intravenous, Once, Sindy Guadeloupe, MD .  ondansetron (ZOFRAN) 8 mg, dexamethasone (DECADRON) 10 mg in sodium chloride 0.9 % 50 mL IVPB, , Intravenous, Once, Sindy Guadeloupe, MD  Physical exam:  Vitals:   08/23/17 1432  BP: 119/81  Pulse: 79  Resp: 18  Temp: (!) 97 F (36.1 C)  TempSrc: Tympanic  SpO2: 96%  Weight: 179 lb 8 oz (81.4 kg)  Height: 5\' 9"  (1.753 m)   Physical Exam  Constitutional: He is oriented to person, place, and time. He appears well-developed and well-nourished.  HENT:  Head: Normocephalic and atraumatic.  Eyes: Pupils are equal, round, and reactive to light. EOM are normal.  Neck: Normal range of motion.  Cardiovascular: Normal rate, regular rhythm and normal heart sounds.  Pulmonary/Chest: Effort normal and breath sounds normal.  Abdominal: Soft. Bowel sounds are normal.  Neurological: He is alert and oriented to person, place, and time.  Skin: Skin is warm and dry.     CMP Latest Ref Rng & Units 08/23/2017  Glucose 65 - 99 mg/dL 104(H)  BUN 6 - 20 mg/dL 17    Creatinine 0.61 - 1.24 mg/dL 0.92  Sodium 135 - 145 mmol/L 140  Potassium 3.5 - 5.1 mmol/L 3.7  Chloride 101 - 111 mmol/L 112(H)  CO2 22 - 32 mmol/L 21(L)  Calcium 8.9 - 10.3 mg/dL 8.9  Total Protein 6.5 - 8.1 g/dL 7.0  Total Bilirubin 0.3 - 1.2 mg/dL 0.9  Alkaline Phos 38 - 126 U/L 81  AST 15 - 41 U/L 12(L)  ALT 17 - 63 U/L 19   CBC Latest Ref Rng & Units 08/23/2017  WBC 3.8 - 10.6 K/uL 5.1  Hemoglobin 13.0 - 18.0 g/dL 13.4  Hematocrit 40.0 - 52.0 % 40.2  Platelets 150 - 440 K/uL 210    No images are attached to the  encounter.  Ct Head Wo Contrast  Result Date: 07/27/2017 CLINICAL DATA:  Dizziness, nausea. EXAM: CT HEAD WITHOUT CONTRAST TECHNIQUE: Contiguous axial images were obtained from the base of the skull through the vertex without intravenous contrast. COMPARISON:  None. FINDINGS: Brain: Ventricles are within normal limits in size and configuration. There is no mass, hemorrhage, edema or other evidence of acute parenchymal abnormality. No extra-axial hemorrhage. Vascular: There are chronic calcified atherosclerotic changes of the large vessels at the skull base. No unexpected hyperdense vessel. Skull: Normal. Negative for fracture or focal lesion. Sinuses/Orbits: No acute finding. Other: None. IMPRESSION: Negative head CT.  No intracranial mass, hemorrhage or edema. Electronically Signed   By: Franki Cabot M.D.   On: 07/27/2017 22:58   Ct Abdomen Pelvis W Contrast  Result Date: 07/27/2017 CLINICAL DATA:  Acute onset of vomiting. EXAM: CT ABDOMEN AND PELVIS WITH CONTRAST TECHNIQUE: Multidetector CT imaging of the abdomen and pelvis was performed using the standard protocol following bolus administration of intravenous contrast. CONTRAST:  134mL OMNIPAQUE IOHEXOL 300 MG/ML  SOLN COMPARISON:  CT of the abdomen and pelvis performed 06/11/2017 FINDINGS: Lower chest: Minimal bibasilar atelectasis or scarring is noted. Scattered coronary artery calcifications are seen. The visualized  portions of the patient's gastric pull-through are grossly unremarkable in appearance, containing a small amount of solid and fluid material. Hepatobiliary: The liver is unremarkable in appearance. The patient is status post cholecystectomy, with clips noted at the gallbladder fossa. The common bile duct remains normal in caliber. Pancreas: The pancreas is within normal limits. Spleen: A few scattered calcifications are noted within the spleen. A small splenic cyst is noted at the anterior aspect of the spleen. Adrenals/Urinary Tract: The adrenal glands are unremarkable. Nonspecific perinephric stranding is noted bilaterally. There is no evidence of hydronephrosis. No renal or ureteral stones are identified. Stomach/Bowel: The stomach is unremarkable in appearance. A J-tube is noted at the left mid abdomen. The small bowel is unremarkable in appearance. The appendix is normal in caliber, without evidence of appendicitis. The colon is unremarkable in appearance. Vascular/Lymphatic: Scattered calcification is seen along the abdominal aorta and its branches. The abdominal aorta is otherwise grossly unremarkable. The inferior vena cava is grossly unremarkable. No retroperitoneal lymphadenopathy is seen. No pelvic sidewall lymphadenopathy is identified. Reproductive: The bladder is mildly distended and grossly unremarkable. The prostate is borderline enlarged. Other: No additional soft tissue abnormalities are seen. Musculoskeletal: No acute osseous abnormalities are identified. The visualized musculature is unremarkable in appearance. IMPRESSION: 1. No acute abnormality seen to explain the patient's symptoms. Visualized portions of the gastric pull-through are grossly unremarkable. 2. Scattered coronary artery calcifications seen. 3. Small splenic cyst noted. Aortic Atherosclerosis (ICD10-I70.0). Electronically Signed   By: Garald Balding M.D.   On: 07/27/2017 23:14     Assessment and plan- Patient is a 77 y.o. 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 ypT0ypN1 here to discuss post operative surveillance  Discussed final pathology with the patient in detail. He responded well to enoadjuvant chemo/RT. He had no residual carcinoma at primary site. 1/5 LN positive for malignancy. No role for adjuvant chemotherapy at this time. I will plan to get repeat ct chest abdomen pelvis in 4 months from now. Check cbc, cmp and CEA at that time. Will decide about port removal at next visit. He will continue to get port flushes at this time   Visit Diagnosis 1. H/O esophagectomy   2. Malignant neoplasm of lower third of  esophagus ([place])      Dr. Randa Evens, MD, MPH [facility] at [facility] [phone] 08/25/2017 10:44 AM

## 2017-09-21 ENCOUNTER — Inpatient Hospital Stay: Payer: Medicare Other | Attending: Oncology

## 2017-09-21 ENCOUNTER — Inpatient Hospital Stay: Payer: Medicare Other | Admitting: Nurse Practitioner

## 2017-09-21 ENCOUNTER — Inpatient Hospital Stay: Payer: Medicare Other

## 2017-09-21 VITALS — BP 120/71 | HR 96 | Temp 97.5°F | Resp 18 | Wt 172.6 lb

## 2017-09-21 DIAGNOSIS — C155 Malignant neoplasm of lower third of esophagus: Secondary | ICD-10-CM | POA: Diagnosis not present

## 2017-09-21 DIAGNOSIS — K222 Esophageal obstruction: Secondary | ICD-10-CM

## 2017-09-21 DIAGNOSIS — Z452 Encounter for adjustment and management of vascular access device: Secondary | ICD-10-CM | POA: Insufficient documentation

## 2017-09-21 MED ORDER — HEPARIN SOD (PORK) LOCK FLUSH 100 UNIT/ML IV SOLN
500.0000 [IU] | Freq: Once | INTRAVENOUS | Status: AC
  Administered 2017-09-21: 500 [IU] via INTRAVENOUS

## 2017-09-21 MED ORDER — SODIUM CHLORIDE 0.9% FLUSH
10.0000 mL | Freq: Once | INTRAVENOUS | Status: AC
  Administered 2017-09-21: 10 mL via INTRAVENOUS
  Filled 2017-09-21: qty 10

## 2017-09-21 NOTE — Progress Notes (Signed)
Survivorship Care Plan visit completed.  Treatment summary reviewed and given to patient.  ASCO answers booklet reviewed and given to patient.  CARE program and Cancer Transitions discussed with patient along with other resources cancer center offers to patients and caregivers.  Patient verbalized understanding.    

## 2017-10-25 ENCOUNTER — Encounter: Payer: Medicare Other | Admitting: Cardiothoracic Surgery

## 2017-11-05 ENCOUNTER — Other Ambulatory Visit: Payer: Self-pay | Admitting: Cardiothoracic Surgery

## 2017-11-05 DIAGNOSIS — C155 Malignant neoplasm of lower third of esophagus: Secondary | ICD-10-CM

## 2017-11-08 ENCOUNTER — Ambulatory Visit
Admission: RE | Admit: 2017-11-08 | Discharge: 2017-11-08 | Disposition: A | Discharge status: Home / Self Care | Payer: Medicare Other | Source: Ambulatory Visit | Attending: Cardiothoracic Surgery | Admitting: Cardiothoracic Surgery

## 2017-11-08 ENCOUNTER — Encounter: Payer: Self-pay | Admitting: Cardiothoracic Surgery

## 2017-11-08 ENCOUNTER — Other Ambulatory Visit: Payer: Self-pay

## 2017-11-08 ENCOUNTER — Ambulatory Visit (INDEPENDENT_AMBULATORY_CARE_PROVIDER_SITE_OTHER): Payer: Medicare Other | Admitting: Cardiothoracic Surgery

## 2017-11-08 VITALS — BP 134/72 | HR 77 | Resp 18 | Ht 69.0 in | Wt 166.4 lb

## 2017-11-08 DIAGNOSIS — Z9889 Other specified postprocedural states: Secondary | ICD-10-CM

## 2017-11-08 DIAGNOSIS — Z9049 Acquired absence of other specified parts of digestive tract: Secondary | ICD-10-CM | POA: Diagnosis not present

## 2017-11-08 DIAGNOSIS — C155 Malignant neoplasm of lower third of esophagus: Secondary | ICD-10-CM | POA: Diagnosis not present

## 2017-11-08 DIAGNOSIS — Z09 Encounter for follow-up examination after completed treatment for conditions other than malignant neoplasm: Secondary | ICD-10-CM

## 2017-11-08 IMAGING — DX DG CHEST 2V
2 series · 2 of 2 positions shown · non-contrast
Comparison: Chest x-rays dated [DATE] and [DATE].

CLINICAL DATA: Hx of esophageal ca, radiation before surg, surg was
[DATE], no chest complaints today.

EXAM:
CHEST - 2 VIEW

[dg chest 2 view (1 of 2)]
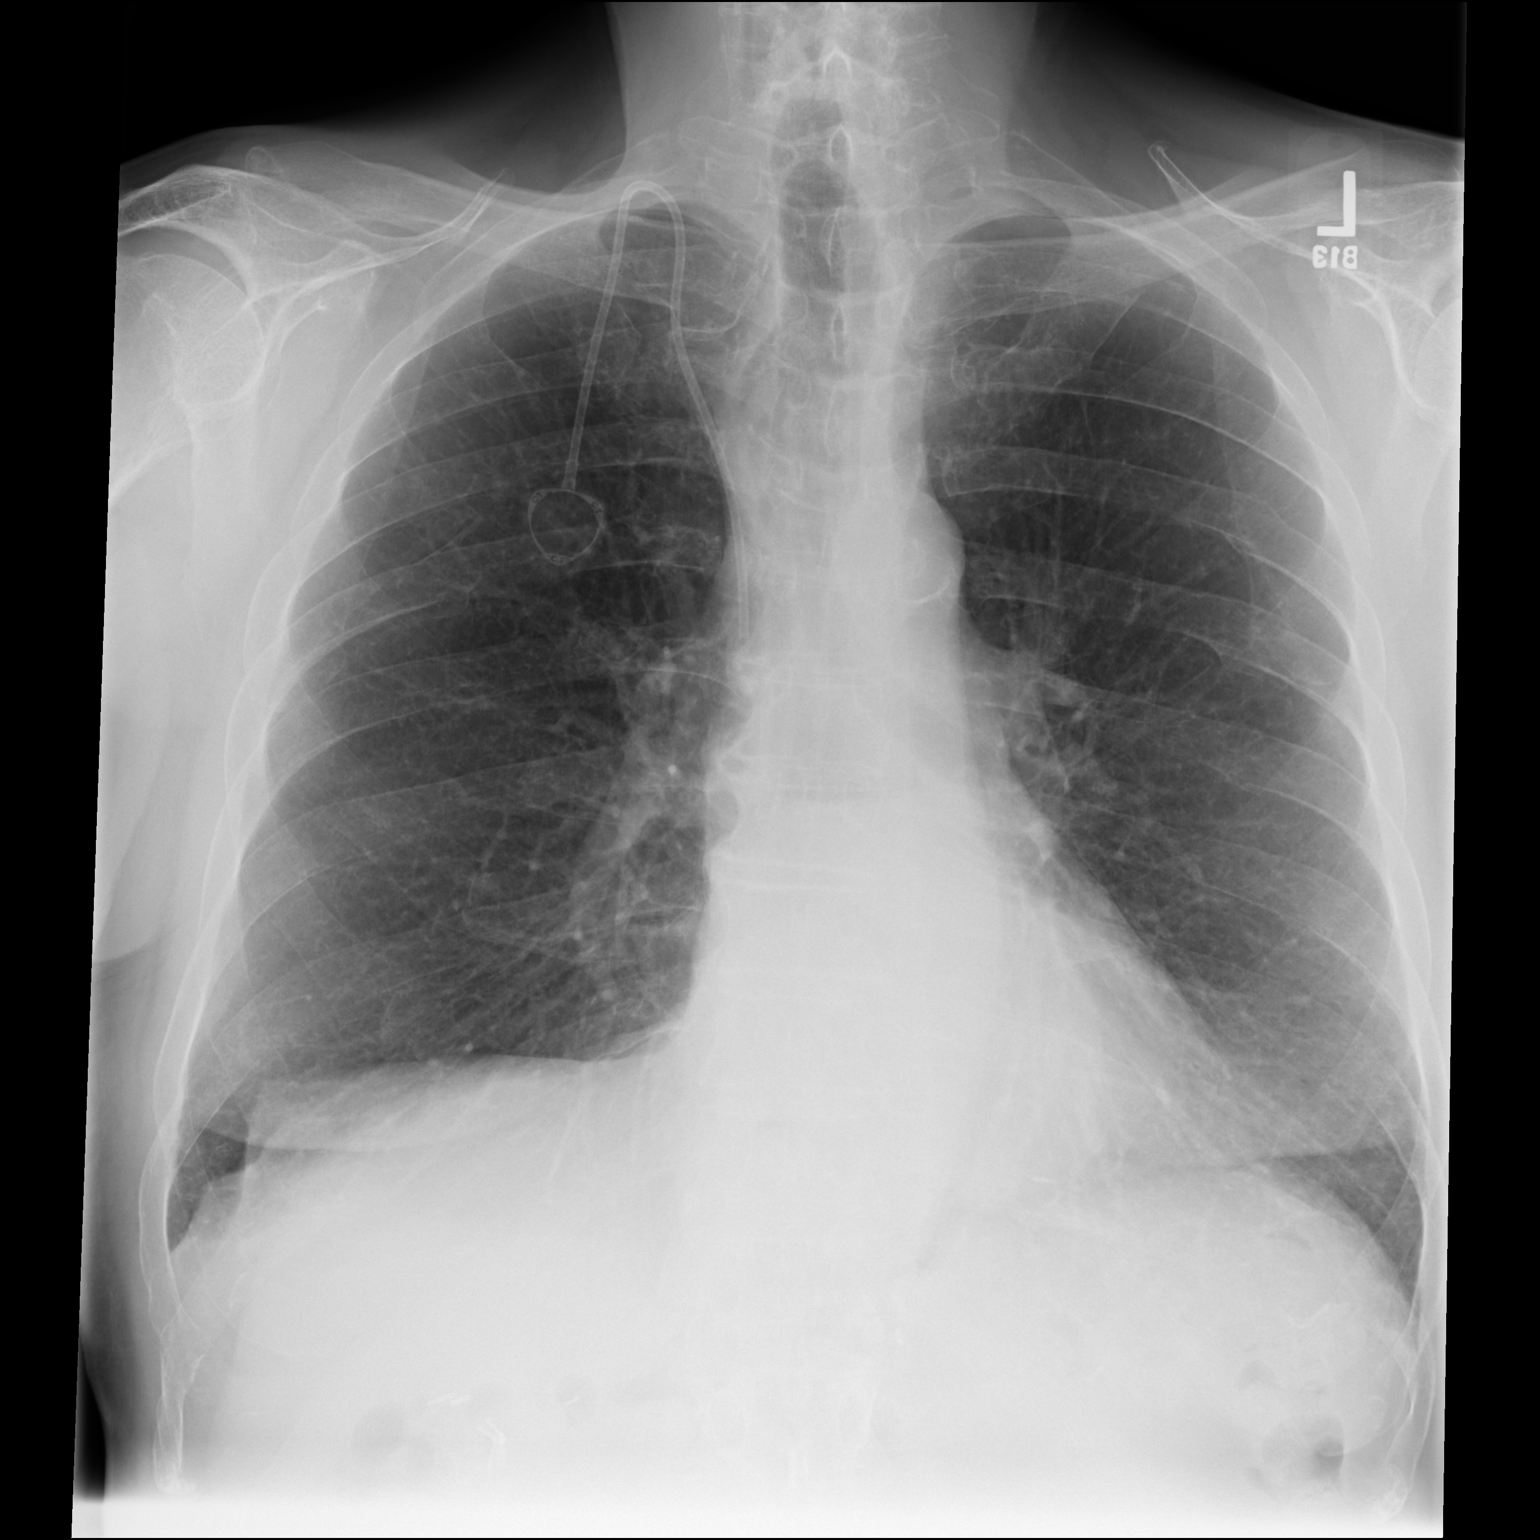

[dg chest 2 view (2 of 2)]
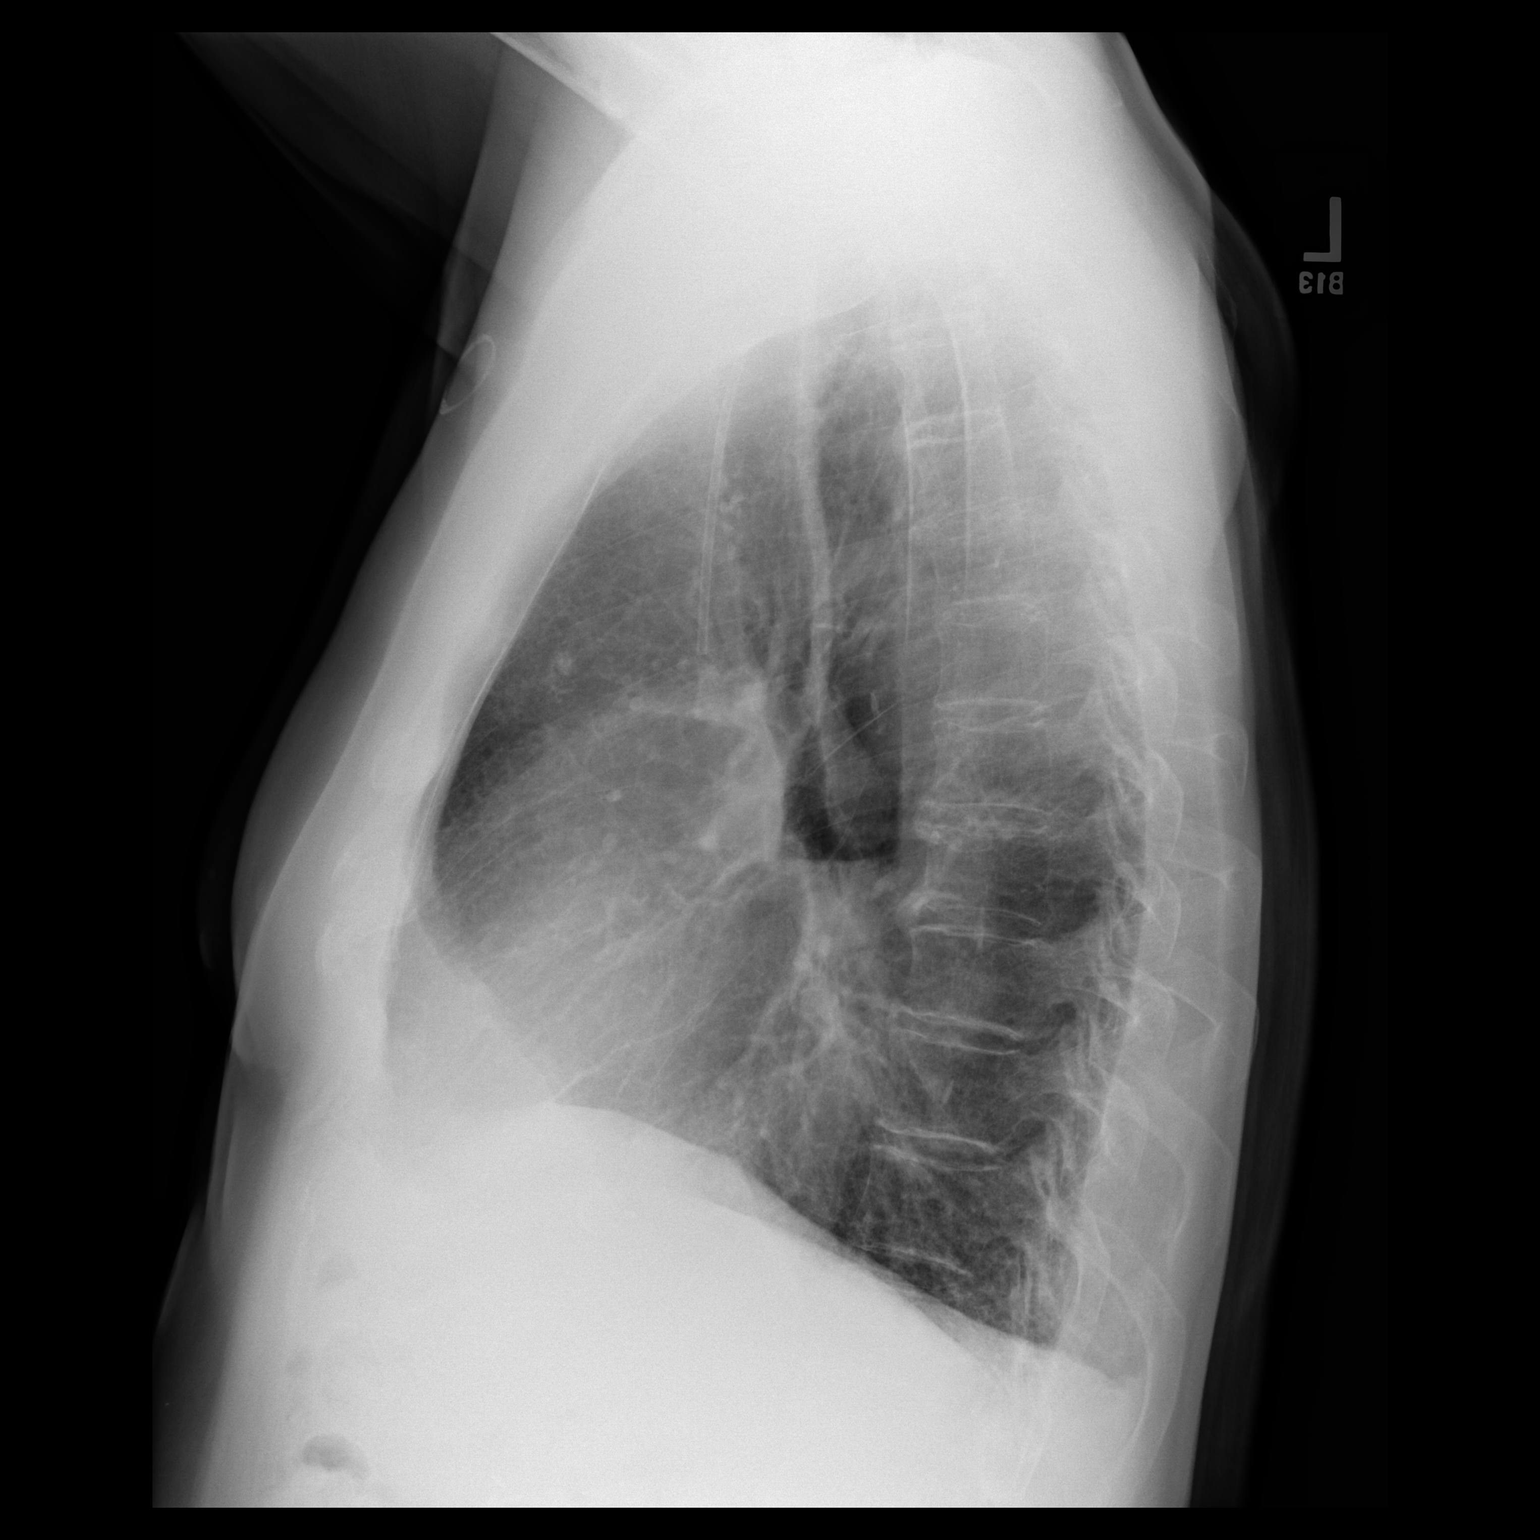

[2 of 2 positions shown; findings below may reference images not displayed]

FINDINGS: Heart size and mediastinal contours are within normal limits. Lungs
are clear. No pleural effusion or pneumothorax seen. RIGHT chest
wall Port-A-Cath appears stable in position with tip at the level of
the mid/upper SVC. No acute or suspicious osseous finding.
IMPRESSION: No active cardiopulmonary disease.

## 2017-11-08 NOTE — Progress Notes (Signed)
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 SURGICAL PROCEDURES:  Video bronchoscopy, transhiatal total esophagectomy with cervical esophagogastrostomy, pyloromyotomy. SURGEON:  Lanelle Bal, MD.   Cancer Staging Malignant neoplasm of lower third of esophagus [facility]) Staging form: Esophagus - Adenocarcinoma, AJCC 8th Edition - Clinical stage from 01/04/2017: Stage Unknown (cTX, cN0, cM0) - Signed by Sindy Guadeloupe, MD on 01/04/2017 - Pathologic stage from 06/22/2017: Stage Unknown (ypTX, pN1, cM0, GX) - Signed by Grace Isaac, MD on 06/22/2017   History of Present Illness:     Patient returns to the office after transhiatal total esophagectomy done on April 8.  The patient is now making good progress postoperatively.  He has not used his jejunostomy feeding tube for several weeks.  He is taking a p.o. diet without difficulty.  He has learned to avoid dairy products and sweets.  He denies any reflux symptoms.  Patient notes that he is been able to take a p.o. diet without any difficulty.  Overall he feels well and notes he is significantly increased his physical activity.  Traveled to see his great grandchild several weeks ago.     Final path: Diagnosis 1. Lymph node, biopsy, omental - FIBROADIPOSE TISSUE - NO MALIGNANCY IDENTIFIED 2. 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 An oncology table was not completed because there was no residual carcinoma identified in esophagus consistent with a complete response (Score 0) to neoadjuvant therapy. Based on the current specimen, this would be staged as ypT0 ypN1. Dr. Saralyn Pilar reviewed the case and agrees with the above diagnosis. DAWN BUTLER MD  Wt Readings from Last 3 Encounters:  11/08/17 166 lb 6.4 oz (75.5 kg)  09/21/17 172 lb 9.6 oz (78.3 kg)  08/23/17 179 lb 8 oz (81.4 kg)    Zubrod Score: At the time of surgery this patient's most appropriate activity status/level should be described as: []     0    Normal activity, no symptoms [x]     1    Restricted in physical strenuous activity but ambulatory, able to do out light work []     2    Ambulatory and capable of self care, unable to do work activities, up and about                 >50 % of waking hours                                                                                   []   3    Only limited self care, in bed greater than 50% of waking hours []     4    Completely disabled, no self care, confined to bed or chair []     5    Moribund  Social History   Tobacco Use  Smoking Status Never Smoker  Smokeless Tobacco Never Used       No Known Allergies  No current outpatient medications on file.   No current facility-administered medications for this visit.    Facility-Administered Medications Ordered in Other Visits  Medication Dose Route Frequency Provider Last Rate Last Dose  . 0.9 %  sodium chloride infusion   Intravenous Once Sindy Guadeloupe, MD      . ondansetron (ZOFRAN) 8 mg, dexamethasone (DECADRON) 10 mg in sodium chloride 0.9 % 50 mL IVPB   Intravenous Once Sindy Guadeloupe, MD           Physical Exam: BP  134/72 (BP Location: Left Arm, Patient Position: Sitting, Cuff Size: Normal)   Pulse 77   Resp 18   Ht 5\' 9"  (1.753 m)   Wt 166 lb 6.4 oz (75.5 kg)   SpO2 97% Comment: RA  BMI 24.57 kg/m  General appearance: alert, cooperative, appears stated age and no distress Head: Normocephalic, without obvious abnormality, atraumatic Neck: no adenopathy, no carotid bruit, no JVD, supple, symmetrical, trachea midline and thyroid not enlarged, symmetric, no tenderness/mass/nodules Lymph nodes: Cervical, supraclavicular, and axillary nodes normal. Resp: clear to auscultation bilaterally Cardio: regular rate and rhythm, S1, S2 normal, no murmur, click, rub or gallop GI: soft, non-tender; bowel sounds normal; no masses,  no organomegaly Extremities: extremities normal, atraumatic, no cyanosis or edema and Homans sign is negative, no sign of DVT Neurologic: Grossly normal Patient's left neck incision and abdominal incisions are well-healed, his jejunostomy feeding tube site is also well-healed without any further drainage.  Diagnostic Studies & Laboratory data:         Recent Radiology Findings: Dg Chest 2 View  Result Date: 11/08/2017 CLINICAL DATA:  Hx of esophageal ca, radiation before surg, surg was 06/2017, no chest complaints today. EXAM: CHEST - 2 VIEW COMPARISON:  Chest x-rays dated 07/19/2017 and 06/27/2017. FINDINGS: Heart size and mediastinal contours are within normal limits. Lungs are clear. No pleural effusion or pneumothorax seen. RIGHT chest wall Port-A-Cath appears stable in position with tip at the level of the mid/upper SVC. No acute or suspicious osseous finding. IMPRESSION: No active cardiopulmonary disease. Electronically Signed   By: Franki Cabot M.D.   On: 11/08/2017 14:55     Recent Labs: Lab Results  Component Value Date   WBC 5.1 08/23/2017   HGB 13.4 08/23/2017   HCT 40.2 08/23/2017   PLT 210 08/23/2017   GLUCOSE 104 (H) 08/23/2017   TRIG 92 12/11/2016   ALT 19  08/23/2017   AST 12 (L) 08/23/2017   NA 140 08/23/2017   K 3.7 08/23/2017   CL 112 (H) 08/23/2017   CREATININE 0.92 08/23/2017   BUN 17 08/23/2017   CO2 21 (L) 08/23/2017   INR 1.00 06/14/2017      Assessment / Plan:   Patient doing well following recent transhiatal total esophagectomy preceded by radiation and chemotherapy.  We will plan to see him back in 3 months. Currently does not have any issues with p.o. diet or swallowing.  Grace Isaac 11/08/2017 4:19 PM

## 2017-11-16 ENCOUNTER — Inpatient Hospital Stay: Payer: Medicare Other | Attending: Oncology

## 2017-11-16 DIAGNOSIS — C155 Malignant neoplasm of lower third of esophagus: Secondary | ICD-10-CM | POA: Diagnosis not present

## 2017-11-16 DIAGNOSIS — Z452 Encounter for adjustment and management of vascular access device: Secondary | ICD-10-CM | POA: Insufficient documentation

## 2017-11-16 DIAGNOSIS — K222 Esophageal obstruction: Secondary | ICD-10-CM

## 2017-11-16 MED ORDER — SODIUM CHLORIDE 0.9% FLUSH
10.0000 mL | Freq: Once | INTRAVENOUS | Status: AC
  Administered 2017-11-16: 10 mL via INTRAVENOUS
  Filled 2017-11-16: qty 10

## 2017-11-16 MED ORDER — HEPARIN SOD (PORK) LOCK FLUSH 100 UNIT/ML IV SOLN
500.0000 [IU] | Freq: Once | INTRAVENOUS | Status: AC
  Administered 2017-11-16: 500 [IU] via INTRAVENOUS
  Filled 2017-11-16: qty 5

## 2017-12-21 ENCOUNTER — Ambulatory Visit
Admission: RE | Admit: 2017-12-21 | Discharge: 2017-12-21 | Disposition: A | Discharge status: Home / Self Care | Payer: Medicare Other | Source: Ambulatory Visit | Attending: Oncology | Admitting: Oncology

## 2017-12-21 DIAGNOSIS — Z8501 Personal history of malignant neoplasm of esophagus: Secondary | ICD-10-CM | POA: Diagnosis not present

## 2017-12-21 DIAGNOSIS — I700 Atherosclerosis of aorta: Secondary | ICD-10-CM | POA: Insufficient documentation

## 2017-12-21 DIAGNOSIS — R911 Solitary pulmonary nodule: Secondary | ICD-10-CM | POA: Diagnosis not present

## 2017-12-21 DIAGNOSIS — J439 Emphysema, unspecified: Secondary | ICD-10-CM | POA: Diagnosis not present

## 2017-12-21 DIAGNOSIS — Z9889 Other specified postprocedural states: Secondary | ICD-10-CM | POA: Diagnosis not present

## 2017-12-21 DIAGNOSIS — I2510 Atherosclerotic heart disease of native coronary artery without angina pectoris: Secondary | ICD-10-CM | POA: Insufficient documentation

## 2017-12-21 DIAGNOSIS — Z9049 Acquired absence of other specified parts of digestive tract: Secondary | ICD-10-CM

## 2017-12-21 DIAGNOSIS — G9520 Unspecified cord compression: Secondary | ICD-10-CM | POA: Diagnosis not present

## 2017-12-21 LAB — POCT I-STAT CREATININE: CREATININE: 1 mg/dL (ref 0.61–1.24)

## 2017-12-21 IMAGING — CT CT CHEST W/ CM
2 of 5 series · 12 of 36 positions shown, 15 images · IV contrast (iopamidol)
Comparison: [DATE] abdominopelvic CT.  Chest CT [DATE].  In

CLINICAL DATA: Carcinoma of the distal third of the esophagus,
status post radiation therapy and chemotherapy. Esophagectomy in
[place].

EXAM:
CT CHEST, ABDOMEN, AND PELVIS WITH CONTRAST
TECHNIQUE: Multidetector CT imaging of the chest, abdomen and pelvis was
performed following the standard protocol during bolus
administration of intravenous contrast.
CONTRAST:  100mL [NR] IOPAMIDOL ([NR]) INJECTION 61%

[Series 2: axials cap · axial · 0.71mm/px · z∈[-1553,-1003]mm · 9 of 134 slices shown, 12 images]
[im 12/134  mediastinal]
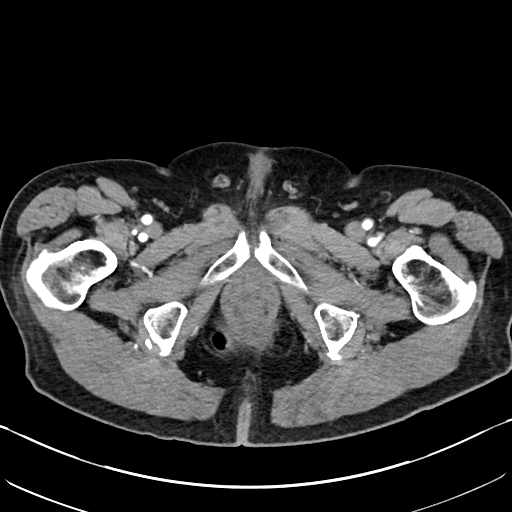
[im 12/134  lung]
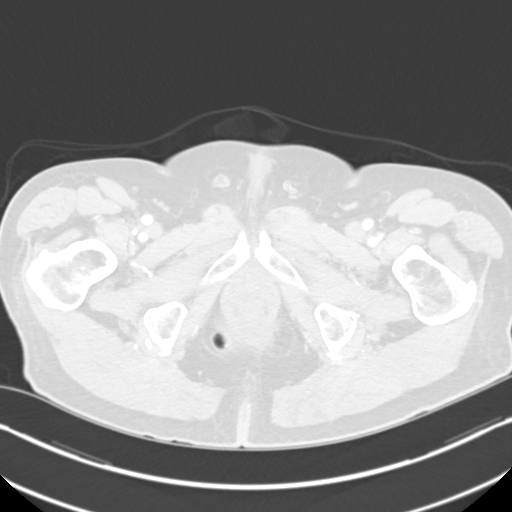
[im 23/134  lung]
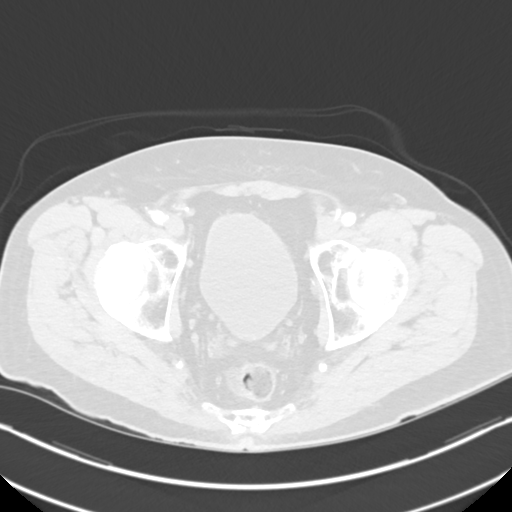
[im 45/134  lung]
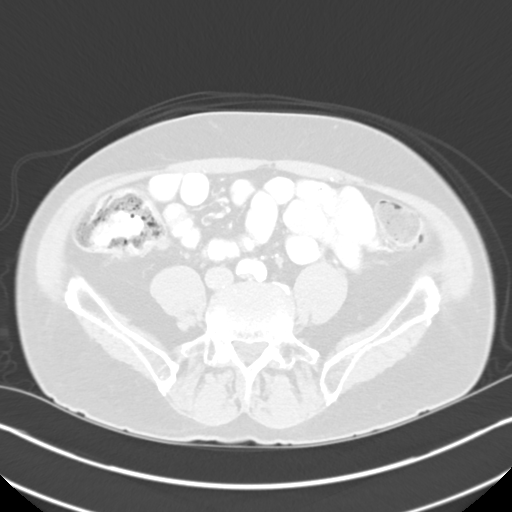
[im 56/134  lung]
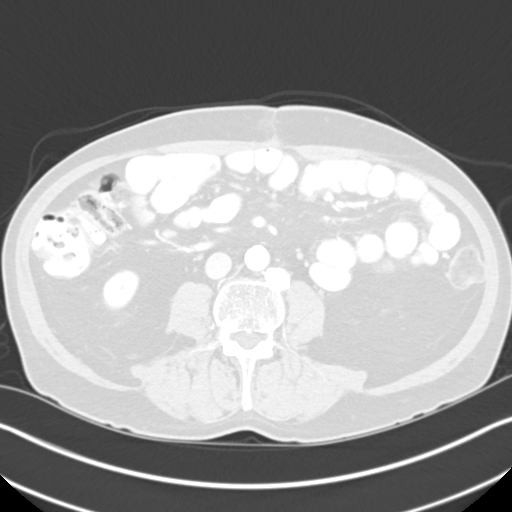
[im 67/134  mediastinal]
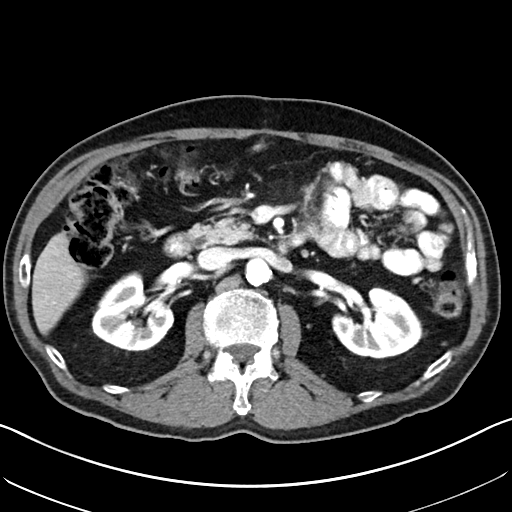
[im 67/134  lung]
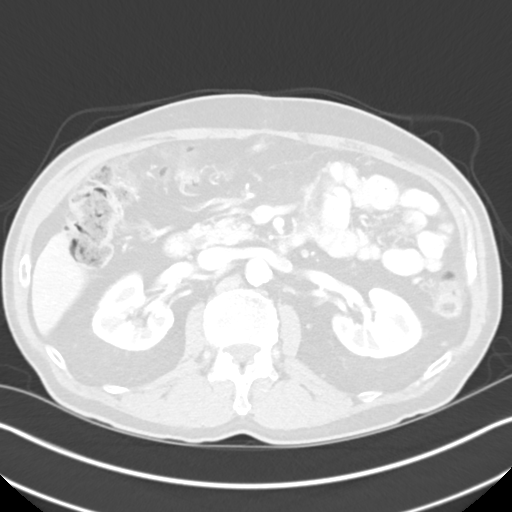
[im 78/134  lung]
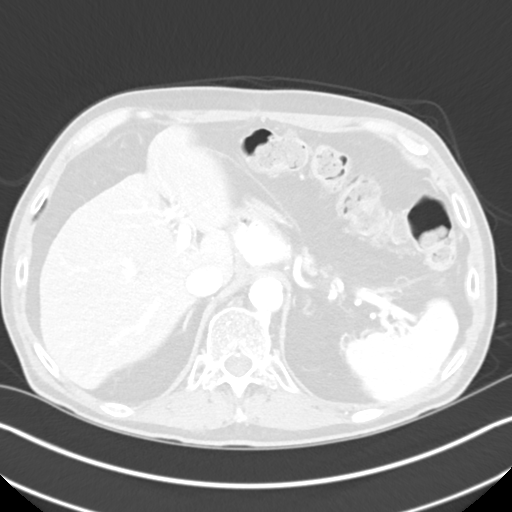
[im 89/134  lung]
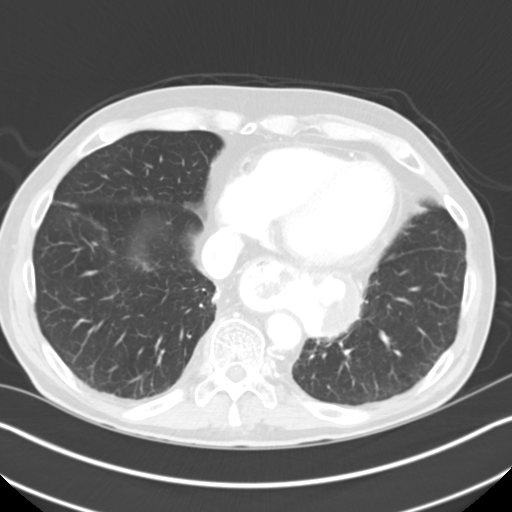
[im 111/134  lung]
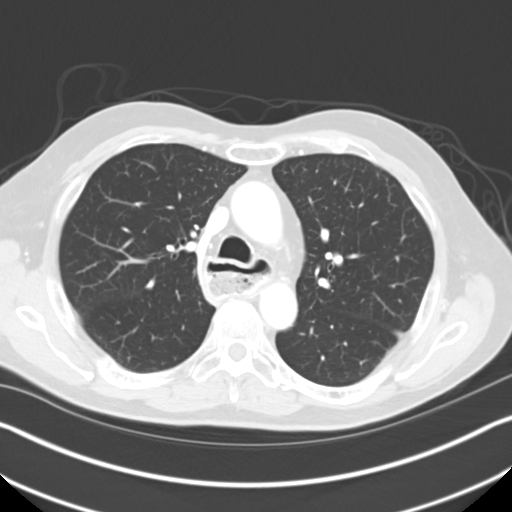
[im 122/134  mediastinal]
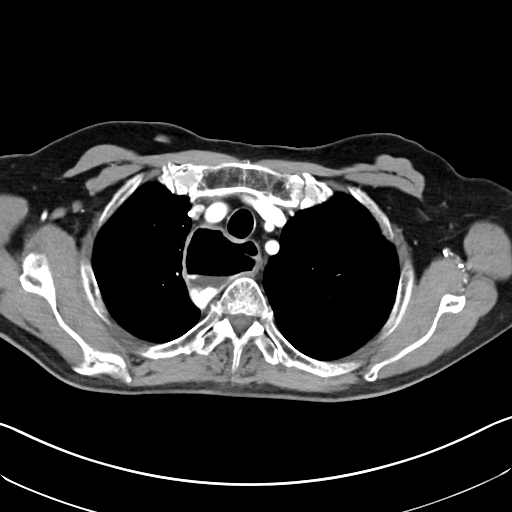
[im 122/134  lung]
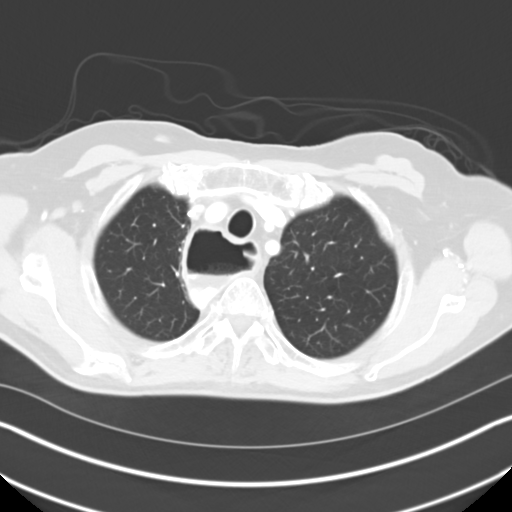

[Series 4: coronals cap · coronal · 0.71mm/px · 3 of 136 slices shown]
[im 28/136  lung]
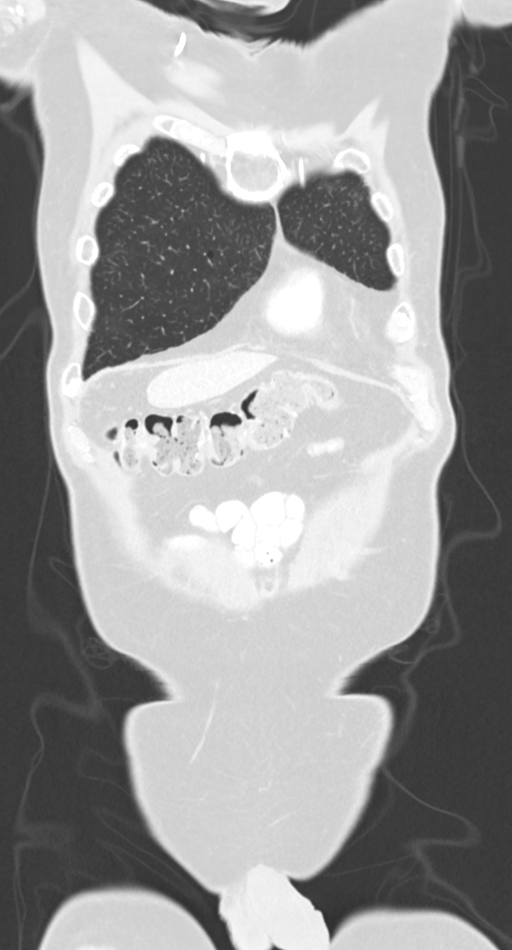
[im 55/136  lung]
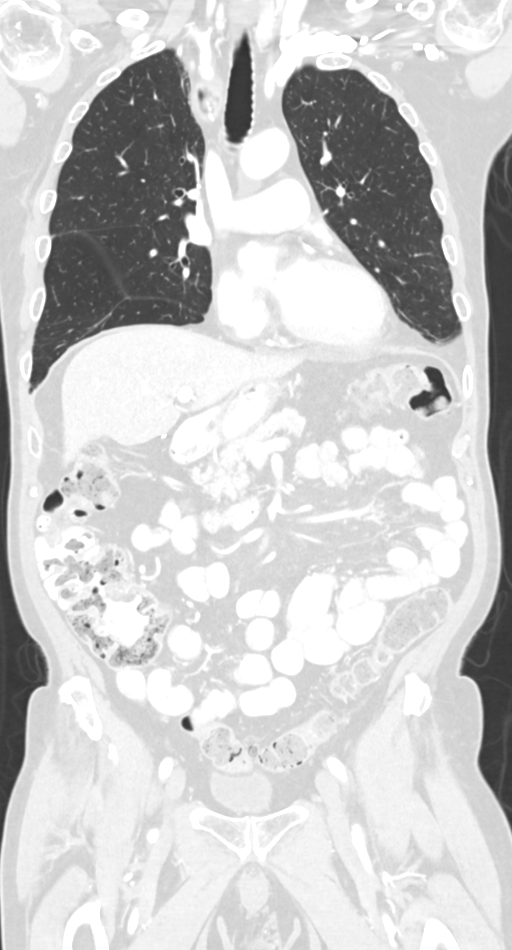
[im 82/136  lung]
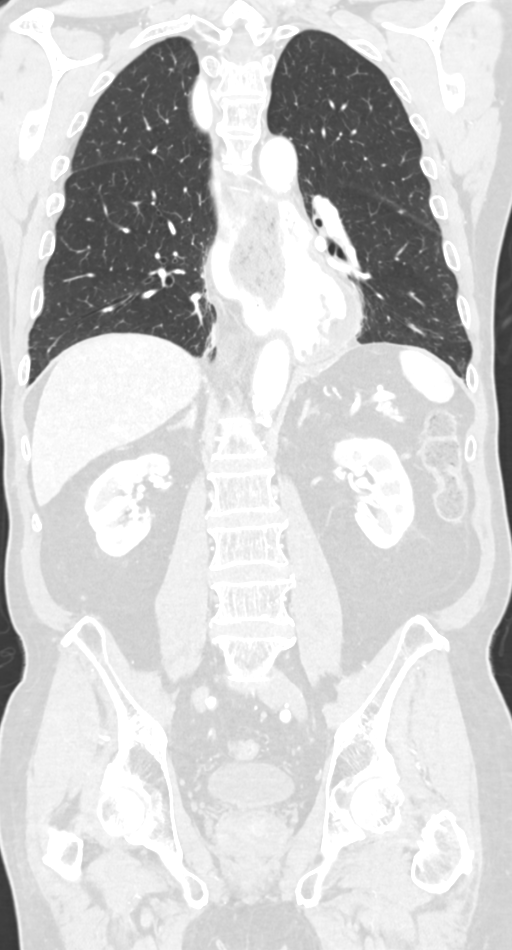

[12 of 36 positions shown; findings below may reference images not displayed]

FINDINGS: CT CHEST FINDINGS

Cardiovascular: A right Port-A-Cath which terminates at the low SVC.
Bovine arch. Aortic and branch vessel atherosclerosis. Normal heart
size, without pericardial effusion. Multivessel coronary artery
atherosclerosis. No central pulmonary embolism, on this
non-dedicated study.

Mediastinum/Nodes: No supraclavicular adenopathy. No mediastinal or
hilar adenopathy. Esophagectomy and gastric pull-through.

Lungs/Pleura: No pleural fluid.  Mild centrilobular emphysema.

Anteromedial right upper lobe 5 mm pulmonary nodule on image 68/3,
similar in size to on the prior and similar to [DATE], favoring
a benign etiology.

Subpleural left upper lobe/lingular reticulonodular opacities are
felt to be similar, including on image 98/3.

Musculoskeletal: Moderate to marked T11 compression deformity with
ventral canal encroachment. This is new compared to [DATE]. No
underlying osseous lesion on prior exams.

CT ABDOMEN PELVIS FINDINGS

Hepatobiliary: High left hepatic lobe cyst of less than 1 cm.
Cholecystectomy, without biliary ductal dilatation.

Pancreas: Mild pancreatic atrophy, without duct dilatation or
dominant mass.

Spleen: Subcentimeter anterior splenic low-density lesion is of
doubtful clinical significance.

Adrenals/Urinary Tract: Normal adrenal glands. Bilateral too small
to characterize renal lesions. No hydronephrosis. Normal urinary
bladder.

Stomach/Bowel: Gastric pull-through. Normal colon and terminal
ileum. Normal small bowel.

Vascular/Lymphatic: Aortic and branch vessel atherosclerosis. No
abdominopelvic adenopathy.

Reproductive: Normal prostate.

Other: No significant free fluid. No evidence of omental or
peritoneal disease.

Musculoskeletal: Mild osteopenia.  Remote right rib trauma.
IMPRESSION: 1. Status post esophagectomy and gastric pull-through. No typical
findings of metastatic disease in the chest, abdomen, or pelvis.
2. Right upper lobe pulmonary nodule is unchanged over multiple
priors, favoring a benign etiology. Lingular reticulonodular
opacities are similar and likely post infectious or inflammatory.
3. T11 compression deformity, new since [DATE]. Favor
posttraumatic, given absence of underlying osseous lesion on prior
exams.
4. Aortic atherosclerosis ([NR]-[NR]), coronary artery
atherosclerosis and emphysema ([NR]-[NR]).

## 2017-12-21 MED ORDER — IOPAMIDOL (ISOVUE-300) INJECTION 61%
100.0000 mL | Freq: Once | INTRAVENOUS | Status: AC | PRN
  Administered 2017-12-21: 100 mL via INTRAVENOUS

## 2017-12-28 ENCOUNTER — Encounter: Payer: Self-pay | Admitting: Oncology

## 2017-12-28 ENCOUNTER — Inpatient Hospital Stay: Payer: Medicare Other | Attending: Oncology

## 2017-12-28 ENCOUNTER — Inpatient Hospital Stay (HOSPITAL_BASED_OUTPATIENT_CLINIC_OR_DEPARTMENT_OTHER): Payer: Medicare Other | Admitting: Oncology

## 2017-12-28 VITALS — BP 117/81 | HR 61 | Temp 97.8°F | Resp 18 | Ht 69.0 in | Wt 159.5 lb

## 2017-12-28 DIAGNOSIS — M8580 Other specified disorders of bone density and structure, unspecified site: Secondary | ICD-10-CM

## 2017-12-28 DIAGNOSIS — Z9049 Acquired absence of other specified parts of digestive tract: Secondary | ICD-10-CM

## 2017-12-28 DIAGNOSIS — Z923 Personal history of irradiation: Secondary | ICD-10-CM | POA: Diagnosis not present

## 2017-12-28 DIAGNOSIS — I2510 Atherosclerotic heart disease of native coronary artery without angina pectoris: Secondary | ICD-10-CM | POA: Insufficient documentation

## 2017-12-28 DIAGNOSIS — C155 Malignant neoplasm of lower third of esophagus: Secondary | ICD-10-CM | POA: Diagnosis present

## 2017-12-28 DIAGNOSIS — Z9889 Other specified postprocedural states: Secondary | ICD-10-CM

## 2017-12-28 DIAGNOSIS — Z87891 Personal history of nicotine dependence: Secondary | ICD-10-CM

## 2017-12-28 DIAGNOSIS — Z08 Encounter for follow-up examination after completed treatment for malignant neoplasm: Secondary | ICD-10-CM

## 2017-12-28 DIAGNOSIS — Z95828 Presence of other vascular implants and grafts: Secondary | ICD-10-CM

## 2017-12-28 DIAGNOSIS — Z9221 Personal history of antineoplastic chemotherapy: Secondary | ICD-10-CM

## 2017-12-28 DIAGNOSIS — Z8501 Personal history of malignant neoplasm of esophagus: Principal | ICD-10-CM

## 2017-12-28 LAB — CBC WITH DIFFERENTIAL/PLATELET
ABS IMMATURE GRANULOCYTES: 0.01 10*3/uL (ref 0.00–0.07)
BASOS ABS: 0.1 10*3/uL (ref 0.0–0.1)
BASOS PCT: 1 %
Eosinophils Absolute: 0.1 10*3/uL (ref 0.0–0.5)
Eosinophils Relative: 1 %
HCT: 41.6 % (ref 39.0–52.0)
Hemoglobin: 13.7 g/dL (ref 13.0–17.0)
IMMATURE GRANULOCYTES: 0 %
LYMPHS ABS: 2 10*3/uL (ref 0.7–4.0)
LYMPHS PCT: 37 %
MCH: 28.4 pg (ref 26.0–34.0)
MCHC: 32.9 g/dL (ref 30.0–36.0)
MCV: 86.1 fL (ref 80.0–100.0)
MONO ABS: 0.5 10*3/uL (ref 0.1–1.0)
MONOS PCT: 10 %
NEUTROS ABS: 2.8 10*3/uL (ref 1.7–7.7)
NEUTROS PCT: 51 %
Platelets: 191 10*3/uL (ref 150–400)
RBC: 4.83 MIL/uL (ref 4.22–5.81)
RDW: 16.6 % — AB (ref 11.5–15.5)
WBC: 5.5 10*3/uL (ref 4.0–10.5)
nRBC: 0 % (ref 0.0–0.2)

## 2017-12-28 LAB — COMPREHENSIVE METABOLIC PANEL
ALBUMIN: 4.1 g/dL (ref 3.5–5.0)
ALT: 16 U/L (ref 0–44)
ANION GAP: 8 (ref 5–15)
AST: 14 U/L — AB (ref 15–41)
Alkaline Phosphatase: 132 U/L — ABNORMAL HIGH (ref 38–126)
BILIRUBIN TOTAL: 0.8 mg/dL (ref 0.3–1.2)
BUN: 20 mg/dL (ref 8–23)
CHLORIDE: 109 mmol/L (ref 98–111)
CO2: 23 mmol/L (ref 22–32)
Calcium: 9 mg/dL (ref 8.9–10.3)
Creatinine, Ser: 1.03 mg/dL (ref 0.61–1.24)
GFR calc Af Amer: >60 mL/min (ref >60)
GFR calc non Af Amer: >60 mL/min (ref >60)
GLUCOSE: 108 mg/dL — AB (ref 70–99)
POTASSIUM: 4 mmol/L (ref 3.5–5.1)
SODIUM: 140 mmol/L (ref 135–145)
Total Protein: 6.6 g/dL (ref 6.5–8.1)

## 2017-12-28 MED ORDER — HEPARIN SOD (PORK) LOCK FLUSH 100 UNIT/ML IV SOLN
500.0000 [IU] | Freq: Once | INTRAVENOUS | Status: AC
  Administered 2017-12-28: 500 [IU] via INTRAVENOUS
  Filled 2017-12-28: qty 5

## 2017-12-28 MED ORDER — SODIUM CHLORIDE 0.9% FLUSH
10.0000 mL | Freq: Once | INTRAVENOUS | Status: AC
  Administered 2017-12-28: 10 mL via INTRAVENOUS
  Filled 2017-12-28: qty 10

## 2017-12-28 NOTE — Progress Notes (Signed)
Patient c/o back pain but has nothing to do with this appointment.

## 2017-12-29 LAB — CEA: CEA: 1.8 ng/mL (ref 0.0–4.7)

## 2017-12-31 NOTE — Progress Notes (Signed)
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 ypT0ypN1   Chief complaint/ Reason for visit-routine follow-up visit for esophageal cancer to discuss the results of CT scan  Heme/Onc history: 1. Patient is a 77 year old gentleman with no significant past medical history and takes no medications he has a remote history of smokingduring his college days and quit smoking thereafter. No history of any alcohol intake. He was recently admitted to the hospital on 12/06/2016 with symptoms of progressive dysphagia which he states has been going on since the starting of September. His dysphagia got to the point that he began to have pain even on swallowing icecream. He underwent EGD at that time which showed but high 2 cm long stricture 2 cm above the GE junction. Biopsies at that time were negative for malignancy. Given that he was unable to eat he was started on TPN at that time and plan was to get a repeat EGD with possible biopsy  2. Patient underwent repeat EGD on 12/26/2016 where he was again noted to have a lower esophageal stricture which reduced the lumen to less than 3-4 mm. There was no evidence of Barrett's esophagus. 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 Shortly after  the stricture was dilated the stricture did close up significantly. Postprocedure patient complained of abdominal pain and there was a concern for perforation and hence a repeat CT abdomen was obtained which did not show any evidence of perforation  3. CT abdomen pelvis as well as CT chest with contrast on 10/01/2018showed: 7 mm sub pleural nodule in the lingula. No evidence of axillary mediastinal or hilar adenopathy.1.3 cm soft tissue nodule adjacent to the GE junctionwhich is favored to represent an enlarged lymph node potentially active or metastatic in etiology. Narrowing involving thickening of the distal esophagus compatible with known distal esophageal stricture  4. He lives with his wife and is independent of his ADLs and IADLs. Besides dysphagia patient reports no loss of appetite or unintentional weight loss over the last few months. He denies any pain  5.[place] has been deemed to be a potential surgical candidate in the future. Plan for now is to proceed with concurrent chemoradiation followed by EUS upon completion of treatment given difficulty with the  second EGD  6. Chemo/RT started on 11/9/18and completed on 03/01/17  7. Patient underwent transhiatal total esophagectomy with cervical esophagogastrostomy, pyloromyotomy in April 2019. Pathology showed no residual carcinoma at primary site. 1/5 LN positive for malignancy. 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  He is back to doing all his activities including lawnmowing and heavy weightlifting.  Appetite is good and he has had no problems swallowing.  ECOG PS- 0 Pain scale- 0 Opioid associated constipation- no  Review of systems- Review of Systems  Constitutional: Negative for chills, fever,  malaise/fatigue and weight loss.  HENT: Negative for congestion, ear discharge and nosebleeds.   Eyes: Negative for blurred  vision.  Respiratory: Negative for cough, hemoptysis, sputum production, shortness of breath and wheezing.   Cardiovascular: Negative for chest pain, palpitations, orthopnea and claudication.  Gastrointestinal: Negative for abdominal pain, blood in stool, constipation, diarrhea, heartburn, melena, nausea and vomiting.  Genitourinary: Negative for dysuria, flank pain, frequency, hematuria and urgency.  Musculoskeletal: Negative for back pain, joint pain and myalgias.  Skin: Negative for rash.  Neurological: Negative for dizziness, tingling, focal weakness, seizures, weakness and headaches.  Endo/Heme/Allergies: Does not bruise/bleed easily.  Psychiatric/Behavioral: Negative for depression and suicidal ideas. The patient does not have insomnia.        No Known Allergies   Past Medical History:  Diagnosis Date  . Cancer ([place])    esophageal  . Dysphagia      Past Surgical History:  Procedure Laterality Date  . CHOLECYSTECTOMY    . COMPLETE ESOPHAGECTOMY N/A 06/18/2017   Procedure: cervical ESOPHAGECTOMY COMPLETE;  Surgeon: Grace Isaac, MD;  Location: [facility] OR;  Service: Thoracic;  Laterality: N/A;  NEED NIMS ET TUBE  . ESOPHAGOGASTRODUODENOSCOPY (EGD) WITH PROPOFOL N/A 12/07/2016   Procedure: ESOPHAGOGASTRODUODENOSCOPY (EGD) WITH PROPOFOL;  Surgeon: Jonathon Bellows, MD;  Location[facility] ENDOSCOPY;  Service: Gastroenterology;  Laterality: N/A;  . ESOPHAGOGASTRODUODENOSCOPY (EGD) WITH PROPOFOL N/A 12/26/2016   Procedure: ESOPHAGOGASTRODUODENOSCOPY (EGD) WITH PROPOFOL WITH DILATION;  Surgeon: Jonathon Bellows, MD;  Location[facility] ENDOSCOPY;  Service: Gastroenterology;  Laterality: N/A;  . EYE SURGERY    . GASTROJEJUNOSTOMY N/A 01/16/2017   Procedure: LAPROSCOPIC ASSIST FEEDING JEJUNOSTOMY TUBE;  Surgeon: Johnathan Hausen, MD;  Location: [facility] ORS;  Service: General;  Laterality: N/A;  . IR FLUORO GUIDE PORT INSERTION RIGHT  01/10/2017  . IR REPLACE G-TUBE SIMPLE WO FLUORO  03/15/2017  . IR REPLC  DUODEN/JEJUNO TUBE PERCUT W/FLUORO  03/05/2017  . PICC LINE INSERTION Right   . PYLOROMYOTOMY N/A 06/18/2017   Procedure: Edwena Blow;  Surgeon: Grace Isaac, MD;  Location: [place];  Service: Thoracic;  Laterality: N/A;  . VIDEO BRONCHOSCOPY N/A 06/18/2017   Procedure: VIDEO BRONCHOSCOPY;  Surgeon: Grace Isaac, MD;  Location: [facility] OR;  Service: Thoracic;  Laterality: N/A;    Social History   Socioeconomic History  . Marital status: Married    Spouse name: Not on file  . Number of children: Not on file  . Years of education: Not on file  . Highest education level: Not on file  Occupational History  . Not on file  Social Needs  . Financial resource strain: Not on file  . Food insecurity:    Worry: Not on file    Inability: Not on file  . Transportation needs:    Medical: Not on file    Non-medical: Not on file  Tobacco Use  . Smoking status: Never Smoker  . Smokeless tobacco: Never Used  Substance and Sexual Activity  . Alcohol use: No  . Drug use: No  . Sexual activity: Not Currently  Lifestyle  . Physical activity:    Days per week: Not on file    Minutes per session: Not on file  . Stress: Not on file  Relationships  . Social connections:    Talks on phone: Not on file    Gets together: Not on file    Attends religious service: Not on file    Active member of club or organization: Not on file    Attends meetings  of clubs or organizations: Not on file    Relationship status: Not on file  . Intimate partner violence:    Fear of current or ex partner: Not on file    Emotionally abused: Not on file    Physically abused: Not on file    Forced sexual activity: Not on file  Other Topics Concern  . Not on file  Social History Narrative   Independent at baseline. Ambulatory    Family History  Problem Relation Age of Onset  . Heart disease Father   . Diabetes Mother   . Hypertension Mother   . Heart attack Brother 23  . Prostate cancer Neg Hx   . Bladder  Cancer Neg Hx   . Kidney cancer Neg Hx     No current outpatient medications on file. No current facility-administered medications for this visit.   Facility-Administered Medications Ordered in Other Visits:  .  0.9 %  sodium chloride infusion, , Intravenous, Once, Sindy Guadeloupe, MD .  ondansetron (ZOFRAN) 8 mg, dexamethasone (DECADRON) 10 mg in sodium chloride 0.9 % 50 mL IVPB, , Intravenous, Once, Sindy Guadeloupe, MD  Physical exam:  Vitals:   12/28/17 1337  BP: 117/81  Pulse: 61  Resp: 18  Temp: 97.8 F (36.6 C)  TempSrc: Tympanic  SpO2: 97%  Weight: 159 lb 8 oz (72.3 kg)  Height: 5\' 9"  (1.753 m)   Physical Exam  Constitutional: He is oriented to person, place, and time. He appears well-developed and well-nourished.  HENT:  Head: Normocephalic and atraumatic.  Eyes: Pupils are equal, round, and reactive to light. EOM are normal.  Neck: Normal range of motion.  Cardiovascular: Normal rate, regular rhythm and normal heart sounds.  Pulmonary/Chest: Effort normal and breath sounds normal.  Abdominal: Soft. Bowel sounds are normal.  Neurological: He is alert and oriented to person, place, and time.  Skin: Skin is warm and dry.     CMP Latest Ref Rng & Units 12/28/2017  Glucose 70 - 99 mg/dL 108(H)  BUN 8 - 23 mg/dL 20  Creatinine 0.61 - 1.24 mg/dL 1.03  Sodium 135 - 145 mmol/L 140  Potassium 3.5 - 5.1 mmol/L 4.0  Chloride 98 - 111 mmol/L 109  CO2 22 - 32 mmol/L 23  Calcium 8.9 - 10.3 mg/dL 9.0  Total Protein 6.5 - 8.1 g/dL 6.6  Total Bilirubin 0.3 - 1.2 mg/dL 0.8  Alkaline Phos 38 - 126 U/L 132(H)  AST 15 - 41 U/L 14(L)  ALT 0 - 44 U/L 16   CBC Latest Ref Rng & Units 12/28/2017  WBC 4.0 - 10.5 K/uL 5.5  Hemoglobin 13.0 - 17.0 g/dL 13.7  Hematocrit 39.0 - 52.0 % 41.6  Platelets 150 - 400 K/uL 191    No images are attached to the encounter.  Ct Chest W Contrast  Result Date: 12/21/2017 CLINICAL DATA:  Carcinoma of the distal third of the esophagus, status  post radiation therapy and chemotherapy. Esophagectomy in April. EXAM: CT CHEST, ABDOMEN, AND PELVIS WITH CONTRAST TECHNIQUE: Multidetector CT imaging of the chest, abdomen and pelvis was performed following the standard protocol during bolus administration of intravenous contrast. CONTRAST:  176mL ISOVUE-300 IOPAMIDOL (ISOVUE-300) INJECTION 61% COMPARISON:  07/27/2017 abdominopelvic CT.  Chest CT 06/11/2017.  In FINDINGS: CT CHEST FINDINGS Cardiovascular: A right Port-A-Cath which terminates at the low SVC. Bovine arch. Aortic and branch vessel atherosclerosis. Normal heart size, without pericardial effusion. Multivessel coronary artery atherosclerosis. No central pulmonary embolism, on this non-dedicated study. Mediastinum/Nodes:  No supraclavicular adenopathy. No mediastinal or hilar adenopathy. Esophagectomy and gastric pull-through. Lungs/Pleura: No pleural fluid.  Mild centrilobular emphysema. Anteromedial right upper lobe 5 mm pulmonary nodule on image 68/3, similar in size to on the prior and similar to 12/11/2016, favoring a benign etiology. Subpleural left upper lobe/lingular reticulonodular opacities are felt to be similar, including on image 98/3. Musculoskeletal: Moderate to marked T11 compression deformity with ventral canal encroachment. This is new compared to 07/27/2017. No underlying osseous lesion on prior exams. CT ABDOMEN PELVIS FINDINGS Hepatobiliary: High left hepatic lobe cyst of less than 1 cm. Cholecystectomy, without biliary ductal dilatation. Pancreas: Mild pancreatic atrophy, without duct dilatation or dominant mass. Spleen: Subcentimeter anterior splenic low-density lesion is of doubtful clinical significance. Adrenals/Urinary Tract: Normal adrenal glands. Bilateral too small to characterize renal lesions. No hydronephrosis. Normal urinary bladder. Stomach/Bowel: Gastric pull-through. Normal colon and terminal ileum. Normal small bowel. Vascular/Lymphatic: Aortic and branch vessel  atherosclerosis. No abdominopelvic adenopathy. Reproductive: Normal prostate. Other: No significant free fluid. No evidence of omental or peritoneal disease. Musculoskeletal: Mild osteopenia.  Remote right rib trauma. IMPRESSION: 1. Status post esophagectomy and gastric pull-through. No typical findings of metastatic disease in the chest, abdomen, or pelvis. 2. Right upper lobe pulmonary nodule is unchanged over multiple priors, favoring a benign etiology. Lingular reticulonodular opacities are similar and likely post infectious or inflammatory. 3. T11 compression deformity, new since 07/27/2017. Favor posttraumatic, given absence of underlying osseous lesion on prior exams. 4. Aortic atherosclerosis (ICD10-I70.0), coronary artery atherosclerosis and emphysema (ICD10-J43.9). Electronically Signed   By: Abigail Miyamoto M.D.   On: 12/21/2017 11:27   Ct Abdomen Pelvis W Contrast  Result Date: 12/21/2017 CLINICAL DATA:  Carcinoma of the distal third of the esophagus, status post radiation therapy and chemotherapy. Esophagectomy in April. EXAM: CT CHEST, ABDOMEN, AND PELVIS WITH CONTRAST TECHNIQUE: Multidetector CT imaging of the chest, abdomen and pelvis was performed following the standard protocol during bolus administration of intravenous contrast. CONTRAST:  128mL ISOVUE-300 IOPAMIDOL (ISOVUE-300) INJECTION 61% COMPARISON:  07/27/2017 abdominopelvic CT.  Chest CT 06/11/2017.  In FINDINGS: CT CHEST FINDINGS Cardiovascular: A right Port-A-Cath which terminates at the low SVC. Bovine arch. Aortic and branch vessel atherosclerosis. Normal heart size, without pericardial effusion. Multivessel coronary artery atherosclerosis. No central pulmonary embolism, on this non-dedicated study. Mediastinum/Nodes: No supraclavicular adenopathy. No mediastinal or hilar adenopathy. Esophagectomy and gastric pull-through. Lungs/Pleura: No pleural fluid.  Mild centrilobular emphysema. Anteromedial right upper lobe 5 mm pulmonary  nodule on image 68/3, similar in size to on the prior and similar to 12/11/2016, favoring a benign etiology. Subpleural left upper lobe/lingular reticulonodular opacities are felt to be similar, including on image 98/3. Musculoskeletal: Moderate to marked T11 compression deformity with ventral canal encroachment. This is new compared to 07/27/2017. No underlying osseous lesion on prior exams. CT ABDOMEN PELVIS FINDINGS Hepatobiliary: High left hepatic lobe cyst of less than 1 cm. Cholecystectomy, without biliary ductal dilatation. Pancreas: Mild pancreatic atrophy, without duct dilatation or dominant mass. Spleen: Subcentimeter anterior splenic low-density lesion is of doubtful clinical significance. Adrenals/Urinary Tract: Normal adrenal glands. Bilateral too small to characterize renal lesions. No hydronephrosis. Normal urinary bladder. Stomach/Bowel: Gastric pull-through. Normal colon and terminal ileum. Normal small bowel. Vascular/Lymphatic: Aortic and branch vessel atherosclerosis. No abdominopelvic adenopathy. Reproductive: Normal prostate. Other: No significant free fluid. No evidence of omental or peritoneal disease. Musculoskeletal: Mild osteopenia.  Remote right rib trauma. IMPRESSION: 1. Status post esophagectomy and gastric pull-through. No typical findings of metastatic disease in the chest, abdomen,  or pelvis. 2. Right upper lobe pulmonary nodule is unchanged over multiple priors, favoring a benign etiology. Lingular reticulonodular opacities are similar and likely post infectious or inflammatory. 3. T11 compression deformity, new since 07/27/2017. Favor posttraumatic, given absence of underlying osseous lesion on prior exams. 4. Aortic atherosclerosis (ICD10-I70.0), coronary artery atherosclerosis and emphysema (ICD10-J43.9). Electronically Signed   By: Abigail Miyamoto M.D.   On: 12/21/2017 11:27     Assessment and plan- Patient is a 77 y.o. male h/o non metastatic esophageal cancer cTxcN0M0 SCC s/p  neoadjuvant chemo/RT and esophagectomy. YpT0ypN1.  He is here for routine follow-up of esophageal cancer and to discuss the results of his surveillance scans  Recent scans from 12/21/2017 showed no evidence of recurrent disease or metastatic disease.  I have reviewed the images independently and discussed the findings with the patient.  He was found to have T11 compression deformity which was not seen in May 2019.  Patient does state that he may have injured his back during 1 of his activities of lifting heavy weights.  He did have some significant pain a couple of weeks ago but the pain is now getting better.  I discussed that if his pain gets worse I could potentially see if he would be a candidate for kyphoplasty by IR.  He would like to hold off on that at this time.  This T11 compression deformity does not appear to be secondary to his cancer.  We will get a repeat scan in March 2020 and I will see him after the scan.  He will continue to get port flushes at this time.  I have also reviewed NCCN guidelines and after trimodality therapy with chemoradiation and surgery surveillance endoscopy is not recommended.    Visit Diagnosis 1. Encounter for follow-up surveillance of esophageal cancer      Dr. Randa Evens, MD, MPH [facility] [phone] 12/31/2017 1:31 PM

## 2018-01-28 DIAGNOSIS — M4854XA Collapsed vertebra, not elsewhere classified, thoracic region, initial encounter for fracture: Secondary | ICD-10-CM

## 2018-01-28 HISTORY — DX: Collapsed vertebra, not elsewhere classified, thoracic region, initial encounter for fracture: M48.54XA

## 2018-02-14 ENCOUNTER — Ambulatory Visit: Payer: Medicare Other | Admitting: Cardiothoracic Surgery

## 2018-02-22 ENCOUNTER — Inpatient Hospital Stay: Payer: Medicare Other | Attending: Oncology

## 2018-02-22 DIAGNOSIS — Z452 Encounter for adjustment and management of vascular access device: Secondary | ICD-10-CM | POA: Diagnosis present

## 2018-02-22 DIAGNOSIS — C155 Malignant neoplasm of lower third of esophagus: Secondary | ICD-10-CM | POA: Diagnosis not present

## 2018-02-22 DIAGNOSIS — Z95828 Presence of other vascular implants and grafts: Secondary | ICD-10-CM

## 2018-02-22 MED ORDER — SODIUM CHLORIDE 0.9% FLUSH
10.0000 mL | Freq: Once | INTRAVENOUS | Status: AC
  Administered 2018-02-22: 10 mL via INTRAVENOUS
  Filled 2018-02-22: qty 10

## 2018-02-22 MED ORDER — HEPARIN SOD (PORK) LOCK FLUSH 100 UNIT/ML IV SOLN
500.0000 [IU] | Freq: Once | INTRAVENOUS | Status: AC
  Administered 2018-02-22: 500 [IU] via INTRAVENOUS

## 2018-02-22 MED ORDER — HEPARIN SOD (PORK) LOCK FLUSH 100 UNIT/ML IV SOLN
INTRAVENOUS | Status: AC
  Filled 2018-02-22: qty 5

## 2018-02-28 ENCOUNTER — Ambulatory Visit (INDEPENDENT_AMBULATORY_CARE_PROVIDER_SITE_OTHER): Payer: Medicare Other | Admitting: Cardiothoracic Surgery

## 2018-02-28 ENCOUNTER — Other Ambulatory Visit: Payer: Self-pay

## 2018-02-28 ENCOUNTER — Encounter: Payer: Self-pay | Admitting: Cardiothoracic Surgery

## 2018-02-28 VITALS — BP 127/85 | HR 93 | Resp 18 | Ht 69.0 in | Wt 163.4 lb

## 2018-02-28 DIAGNOSIS — C155 Malignant neoplasm of lower third of esophagus: Secondary | ICD-10-CM

## 2018-02-28 DIAGNOSIS — Z9889 Other specified postprocedural states: Secondary | ICD-10-CM

## 2018-02-28 DIAGNOSIS — Z09 Encounter for follow-up examination after completed treatment for conditions other than malignant neoplasm: Secondary | ICD-10-CM

## 2018-02-28 DIAGNOSIS — Z9049 Acquired absence of other specified parts of digestive tract: Secondary | ICD-10-CM | POA: Diagnosis not present

## 2018-02-28 NOTE — Progress Notes (Signed)
[address]       [place] [address]             [phone]                  Casey Acevedo [place] Medical Record #[id number] Date of Birth: 15-Nov-1940  Referring MD:Rao, Casey Anna, MD Primary Cardiology: Primary Care:Sparks, Casey Douglas, MD Medical oncologist: Casey Guadeloupe, MD Radiation oncologist:Casey Acevedo, Casey Post, MD  Chief Complaint:  Follow Up Visit 06/18/2017 OPERATIVE REPORT PREOPERATIVE DIAGNOSIS:  Carcinoma of the distal third of the esophagus, SP radiation and chemo POSTOPERATIVE DIAGNOSIS:  Carcinoma of the distal third of the esophagus. SURGICAL PROCEDURES:  Video bronchoscopy, transhiatal total esophagectomy with cervical esophagogastrostomy, pyloromyotomy. SURGEON:  Casey Bal, MD.   Cancer Staging Malignant neoplasm of lower third of esophagus [facility]) Staging form: Esophagus - Adenocarcinoma, AJCC 8th Edition - Clinical stage from 01/04/2017: Stage Unknown (cTX, cN0, cM0) - Signed by Casey Guadeloupe, MD on 01/04/2017 - Pathologic stage from 06/22/2017: Stage Unknown (ypTX, pN1, cM0, GX) - Signed by Casey Isaac, MD on 06/22/2017   History of Present Illness:  Patient returns to the office today after transhiatal total esophagectomy April 2019.  The patient continues to make good progress postoperatively.  He notes he is eating well denies any trouble swallowing currently on no medication.  Says he is is close to normal as it could be.  He is increased his physical activity, to the point of hurting his back lifting     Final path: Diagnosis 1. Lymph node, biopsy, omental - FIBROADIPOSE TISSUE - NO MALIGNANCY IDENTIFIED 2. 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 An oncology table was not completed because there was no residual carcinoma identified in esophagus consistent with a complete response (Score 0) to neoadjuvant therapy. Based on the current specimen, this would be staged as ypT0 ypN1. Dr. Saralyn Acevedo reviewed the case and agrees with the above diagnosis. Casey BUTLER MD  Wt Readings from Last 3 Encounters:  02/28/18 163 lb 6.4 oz (74.1 kg)  12/28/17 159 lb 8 oz (72.3 kg)  11/08/17 166 lb 6.4 oz (75.5 kg)    Zubrod Score: At the time of surgery this patient's most appropriate activity status/level should be described as: [x]     0    Normal activity, no symptoms []     1    Restricted in physical strenuous activity but ambulatory, able to do out light work []     2    Ambulatory and capable of self care, unable to do work activities, up and about                 >50 % of waking hours                                                                                   []     3    Only limited self  care, in bed greater than 50% of waking hours []     4    Completely disabled, no self care, confined to bed or chair []     5    Moribund  Social History   Tobacco Use  Smoking Status Never Smoker  Smokeless Tobacco Never Used       No Known Allergies  No current outpatient medications on file.   No current facility-administered medications for this visit.    Facility-Administered Medications Ordered in Other Visits  Medication Dose Route Frequency Provider Last Rate Last Dose  . 0.9 %  sodium chloride infusion   Intravenous Once Casey Guadeloupe, MD      . ondansetron (ZOFRAN) 8 mg, dexamethasone (DECADRON) 10 mg in sodium chloride 0.9 % 50 mL IVPB   Intravenous Once Casey Guadeloupe, MD           Physical Exam: BP 127/85 (BP Location: Left Arm, Patient Position: Sitting, Cuff Size: Normal)   Pulse 93   Resp 18   Ht 5\' 9"  (1.753 m)   Wt 163 lb 6.4 oz (74.1 kg)   SpO2 97% Comment: RA  BMI 24.13 kg/m   General appearance: alert, cooperative and no distress Head: Normocephalic, without obvious abnormality, atraumatic Neck: no adenopathy, no carotid bruit, no JVD, supple, symmetrical, trachea midline and thyroid not enlarged, symmetric, no tenderness/mass/nodules Lymph nodes: Cervical, supraclavicular, and axillary nodes normal. Resp: clear to auscultation bilaterally Cardio: regular rate and rhythm, S1, S2 normal, no murmur, click, rub or gallop GI: soft, non-tender; bowel sounds normal; no masses,  no organomegaly Extremities: extremities normal, atraumatic, no cyanosis or edema and Homans sign is negative, no sign of DVT Neurologic: Grossly normal Incisions are healing well, left neck neck incision is well-healed without palpable adenopathy  Diagnostic Studies & Laboratory data:         Recent Radiology Findings: No results found.   Recent Labs: Lab Results  Component Value Date   WBC 5.5 12/28/2017   HGB 13.7 12/28/2017   HCT 41.6 12/28/2017   PLT 191 12/28/2017   GLUCOSE 108 (H) 12/28/2017   TRIG 92 12/11/2016   ALT 16 12/28/2017   AST 14 (L) 12/28/2017   NA 140 12/28/2017   K 4.0 12/28/2017   CL 109 12/28/2017   CREATININE 1.03 12/28/2017   BUN 20 12/28/2017   CO2 23 12/28/2017   INR 1.00 06/14/2017      Assessment / Plan:   Patient doing well following transhiatal total esophagectomy preceded by radiation chemotherapy treatment.  He is maintaining his weight well has no obvious evidence of recurrence.  Follow-up CT scan done in October 2019.  Plan to see him back in 6 months.  I  spent 15 minutes with  the patient face to face .   Casey Acevedo 02/28/2018 9:32 AM

## 2018-03-01 ENCOUNTER — Other Ambulatory Visit: Payer: Medicare Other

## 2018-03-01 ENCOUNTER — Ambulatory Visit: Payer: Medicare Other | Admitting: Oncology

## 2018-03-20 ENCOUNTER — Other Ambulatory Visit: Payer: Self-pay | Admitting: *Deleted

## 2018-03-20 ENCOUNTER — Telehealth: Payer: Self-pay | Admitting: *Deleted

## 2018-03-20 DIAGNOSIS — Z8501 Personal history of malignant neoplasm of esophagus: Secondary | ICD-10-CM

## 2018-03-20 DIAGNOSIS — Z08 Encounter for follow-up examination after completed treatment for malignant neoplasm: Secondary | ICD-10-CM

## 2018-03-20 DIAGNOSIS — Z95828 Presence of other vascular implants and grafts: Secondary | ICD-10-CM

## 2018-03-20 NOTE — Telephone Encounter (Signed)
Called asking that Casey Acevedo call himn back wanting to know why he has an appointment Friday and what it is for. I see he has an appointment with IR [phone]

## 2018-03-20 NOTE — Telephone Encounter (Signed)
Called patient to inform him of Port removal procedure date and time. Port will be removed Friday 03/22/2018 at 8:30 am. His arrival time is 7:30am. He goes to [employer] and checks in at registration desk. Informed him to be NPO after midnight. He states he does have a driver for this procedure. He is grateful to be having his port removed.

## 2018-03-20 NOTE — Telephone Encounter (Signed)
Called patient and he said that Sharyn Lull has called him already and scheduled the port to be removed. I told him that I finally found out who put the port in and had to order for it to be removed and then had to send a paper into scheduling to get it scheduled and they called me today and asked Sharyn Lull to call. He is happy to hear from me and he has spoke to several people about getting port out and he will have it out on friday

## 2018-03-21 ENCOUNTER — Other Ambulatory Visit: Payer: Self-pay | Admitting: Radiology

## 2018-03-22 ENCOUNTER — Other Ambulatory Visit: Payer: Self-pay

## 2018-03-22 ENCOUNTER — Ambulatory Visit
Admission: RE | Admit: 2018-03-22 | Discharge: 2018-03-22 | Disposition: A | Discharge status: Home / Self Care | Payer: Medicare Other | Source: Ambulatory Visit | Attending: Oncology | Admitting: Oncology

## 2018-03-22 DIAGNOSIS — Z8501 Personal history of malignant neoplasm of esophagus: Secondary | ICD-10-CM | POA: Insufficient documentation

## 2018-03-22 DIAGNOSIS — Z452 Encounter for adjustment and management of vascular access device: Secondary | ICD-10-CM | POA: Diagnosis not present

## 2018-03-22 DIAGNOSIS — Z95828 Presence of other vascular implants and grafts: Secondary | ICD-10-CM

## 2018-03-22 DIAGNOSIS — Z08 Encounter for follow-up examination after completed treatment for malignant neoplasm: Secondary | ICD-10-CM

## 2018-03-22 HISTORY — PX: IR REMOVAL TUN ACCESS W/ PORT W/O FL MOD SED: IMG2290

## 2018-03-22 LAB — PROTIME-INR
INR: 0.98
PROTHROMBIN TIME: 12.9 s (ref 11.4–15.2)

## 2018-03-22 LAB — CBC
HCT: 43 % (ref 39.0–52.0)
HEMOGLOBIN: 14 g/dL (ref 13.0–17.0)
MCH: 29.4 pg (ref 26.0–34.0)
MCHC: 32.6 g/dL (ref 30.0–36.0)
MCV: 90.1 fL (ref 80.0–100.0)
Platelets: 253 10*3/uL (ref 150–400)
RBC: 4.77 MIL/uL (ref 4.22–5.81)
RDW: 13.6 % (ref 11.5–15.5)
WBC: 6.4 10*3/uL (ref 4.0–10.5)
nRBC: 0 % (ref 0.0–0.2)

## 2018-03-22 MED ORDER — MIDAZOLAM HCL 2 MG/2ML IJ SOLN
INTRAMUSCULAR | Status: AC | PRN
  Administered 2018-03-22: 1 mg via INTRAVENOUS

## 2018-03-22 MED ORDER — MIDAZOLAM HCL 5 MG/5ML IJ SOLN
INTRAMUSCULAR | Status: AC
  Filled 2018-03-22: qty 5

## 2018-03-22 MED ORDER — FENTANYL CITRATE (PF) 100 MCG/2ML IJ SOLN
INTRAMUSCULAR | Status: AC | PRN
  Administered 2018-03-22: 50 ug via INTRAVENOUS

## 2018-03-22 MED ORDER — FENTANYL CITRATE (PF) 100 MCG/2ML IJ SOLN
INTRAMUSCULAR | Status: AC
  Filled 2018-03-22: qty 2

## 2018-03-22 MED ORDER — LIDOCAINE-EPINEPHRINE (PF) 1 %-1:200000 IJ SOLN
INTRAMUSCULAR | Status: AC
  Filled 2018-03-22: qty 10

## 2018-03-22 MED ORDER — HYDROCODONE-ACETAMINOPHEN 5-325 MG PO TABS: 1.0000 | ORAL_TABLET | ORAL | Status: DC | PRN

## 2018-03-22 MED ORDER — SODIUM CHLORIDE 0.9 % IV SOLN
INTRAVENOUS | Status: DC
  Administered 2018-03-22: 08:00:00 via INTRAVENOUS

## 2018-03-22 MED ORDER — CEFAZOLIN SODIUM-DEXTROSE 2-4 GM/100ML-% IV SOLN
2.0000 g | INTRAVENOUS | Status: AC
  Administered 2018-03-22: 2 g via INTRAVENOUS

## 2018-03-22 NOTE — Consult Note (Signed)
Chief Complaint: Patient was seen in consultation today for port removal at the request of McMurray C  Referring Physician(s): Rao,Archana C   Patient Status: [place]  History of Present Illness: Casey Acevedo is a 78 y.o. male with history of esophageal cancer and port placed by Dr. Annamaria Boots on 01/10/17.  Port has worked fine without complication and no longer needed.  Presents for port removal.  No complaints today.   Past Medical History:  Diagnosis Date  . Cancer ([place])    esophageal  . Dysphagia     Past Surgical History:  Procedure Laterality Date  . CHOLECYSTECTOMY    . COMPLETE ESOPHAGECTOMY N/A 06/18/2017   Procedure: cervical ESOPHAGECTOMY COMPLETE;  Surgeon: Grace Isaac, MD;  Location: [facility] OR;  Service: Thoracic;  Laterality: N/A;  NEED NIMS ET TUBE  . ESOPHAGOGASTRODUODENOSCOPY (EGD) WITH PROPOFOL N/A 12/07/2016   Procedure: ESOPHAGOGASTRODUODENOSCOPY (EGD) WITH PROPOFOL;  Surgeon: Jonathon Bellows, MD;  Location: [facility] ENDOSCOPY;  Service: Gastroenterology;  Laterality: N/A;  . ESOPHAGOGASTRODUODENOSCOPY (EGD) WITH PROPOFOL N/A 12/26/2016   Procedure: ESOPHAGOGASTRODUODENOSCOPY (EGD) WITH PROPOFOL WITH DILATION;  Surgeon: Jonathon Bellows, MD;  Location: [facility] [place] ENDOSCOPY;  Service: Gastroenterology;  Laterality: N/A;  . EYE SURGERY    . GASTROJEJUNOSTOMY N/A 01/16/2017   Procedure: LAPROSCOPIC ASSIST FEEDING JEJUNOSTOMY TUBE;  Surgeon: Johnathan Hausen, MD;  Location: [facility] ORS;  Service: General;  Laterality: N/A;  . IR FLUORO GUIDE PORT INSERTION RIGHT  01/10/2017  . IR REPLACE G-TUBE SIMPLE WO FLUORO  03/15/2017  . IR REPLC DUODEN/JEJUNO TUBE PERCUT W/FLUORO  03/05/2017  . PICC LINE INSERTION Right   . PYLOROMYOTOMY N/A 06/18/2017   Procedure: Edwena Blow;  Surgeon: Grace Isaac, MD;  Location: [place];  Service: Thoracic;  Laterality: N/A;  . VIDEO BRONCHOSCOPY N/A 06/18/2017   Procedure: VIDEO BRONCHOSCOPY;  Surgeon: Grace Isaac, MD;  Location: [facility] OR;   Service: Thoracic;  Laterality: N/A;    Allergies: Patient has no known allergies.  Medications: Prior to Admission medications   Not on File     Family History  Problem Relation Age of Onset  . Heart disease Father   . Diabetes Mother   . Hypertension Mother   . Heart attack Brother 23  . Prostate cancer Neg Hx   . Bladder Cancer Neg Hx   . Kidney cancer Neg Hx     Social History   Socioeconomic History  . Marital status: Married    Spouse name: Not on file  . Number of children: Not on file  . Years of education: Not on file  . Highest education level: Not on file  Occupational History  . Not on file  Social Needs  . Financial resource strain: Not on file  . Food insecurity:    Worry: Not on file    Inability: Not on file  . Transportation needs:    Medical: Not on file    Non-medical: Not on file  Tobacco Use  . Smoking status: Never Smoker  . Smokeless tobacco: Never Used  Substance and Sexual Activity  . Alcohol use: No  . Drug use: No  . Sexual activity: Not Currently  Lifestyle  . Physical activity:    Days per week: Not on file    Minutes per session: Not on file  . Stress: Not on file  Relationships  . Social connections:    Talks on phone: Not on file    Gets together: Not on file    Attends religious  service: Not on file    Active member of club or organization: Not on file    Attends meetings of clubs or organizations: Not on file    Relationship status: Not on file  Other Topics Concern  . Not on file  Social History Narrative   Independent at baseline. Ambulatory     Review of Systems: A 12 point ROS discussed and pertinent positives are indicated in the HPI above.  All other systems are negative.  Review of Systems  Constitutional: Negative.   Respiratory: Negative.   Cardiovascular: Negative.   Gastrointestinal: Negative.   Genitourinary: Negative.   Neurological: Negative.     Vital Signs: BP (!) 152/75   Pulse 73    Temp 97.6 F (36.4 C) (Oral)   Resp 11   Ht 5\' 9"  (6.659 m)   Wt 72.6 kg   SpO2 98%   BMI 23.63 kg/m   Physical Exam Cardiovascular:     Rate and Rhythm: Normal rate and regular rhythm.     Heart sounds: Murmur present.  Pulmonary:     Effort: Pulmonary effort is normal.     Breath sounds: Normal breath sounds.  Abdominal:     General: Abdomen is flat. There is no distension.     Palpations: Abdomen is soft.  Skin:    Comments: Right chest port.  Well healed incision.  No redness or swelling.      Imaging: No results found.  Labs:  CBC: Recent Labs    07/27/17 1852 08/23/17 1349 12/28/17 1325 03/22/18 0736  WBC 7.9 5.1 5.5 6.4  HGB 14.6 13.4 13.7 14.0  HCT 45.1 40.2 41.6 43.0  PLT 270 210 191 253    COAGS: Recent Labs    06/14/17 1500 03/22/18 0736  INR 1.00 0.98  APTT 31  --     BMP: Recent Labs    06/28/17 0323 07/27/17 1852 08/23/17 1349 12/21/17 0903 12/28/17 1325  NA 136 138 140  --  140  K 3.9 4.5 3.7  --  4.0  CL 103 103 112*  --  109  CO2 25 25 21*  --  23  GLUCOSE 113* 122* 104*  --  108*  BUN 15 19 17   --  20  CALCIUM 8.3* 9.3 8.9  --  9.0  CREATININE 0.79 0.89 0.92 1.00 1.03  GFRNONAA >60 >60 >60  --  >60  GFRAA >60 >60 >60  --  >60    LIVER FUNCTION TESTS: Recent Labs    06/26/17 0306 07/27/17 1852 08/23/17 1349 12/28/17 1325  BILITOT 0.6 0.7 0.9 0.8  AST 12* 15 12* 14*  ALT 22 18 19 16   ALKPHOS 132* 96 81 132*  PROT 5.9* 7.1 7.0 6.6  ALBUMIN 2.8* 3.9 4.0 4.1    TUMOR MARKERS: No results for input(s): AFPTM, CEA, CA199, CHROMGRNA in the last 8760 hours.  Assessment and Plan:  78 yo with history of esophageal cancer and port placement.  Port is no longer needed and plan for port removal with moderate sedation.  Risks of bleeding and infection discussed and informed consent obtained.    Thank you for this interesting consult.  I greatly enjoyed meeting Casey Acevedo and look forward to participating in their care.   A copy of this report was sent to the requesting provider on this date.  Electronically Signed: Burman Riis, MD 03/22/2018, 8:40 AM   I spent a total of    10 Minutes in face to  face in clinical consultation, greater than 50% of which was counseling/coordinating care for port removal.

## 2018-03-22 NOTE — Procedures (Signed)
Interventional Radiology Procedure:   Indications: Port no longer needed  Procedure: Port removal  Findings: Complete removal of port.  Complications: None     EBL: Minimal  Plan: Discharge to home.   Brigida Scotti R. Anselm Pancoast, MD  Pager: [phone]

## 2018-03-22 NOTE — Discharge Instructions (Signed)
Moderate Conscious Sedation, Adult, Care After These instructions provide you with information about caring for yourself after your procedure. Your health care provider may also give you more specific instructions. Your treatment has been planned according to current medical practices, but problems sometimes occur. Call your health care provider if you have any problems or questions after your procedure. What can I expect after the procedure? After your procedure, it is common:  To feel sleepy for several hours.  To feel clumsy and have poor balance for several hours.  To have poor judgment for several hours.  To vomit if you eat too soon. Follow these instructions at home: For at least 24 hours after the procedure:   Do not: ? Participate in activities where you could fall or become injured. ? Drive. ? Use heavy machinery. ? Drink alcohol. ? Take sleeping pills or medicines that cause drowsiness. ? Make important decisions or sign legal documents. ? Take care of children on your own.  Rest. Eating and drinking  Follow the diet recommended by your health care provider.  If you vomit: ? Drink water, juice, or soup when you can drink without vomiting. ? Make sure you have little or no nausea before eating solid foods. General instructions  Have a responsible adult stay with you until you are awake and alert.  Take over-the-counter and prescription medicines only as told by your health care provider.  If you smoke, do not smoke without supervision.  Keep all follow-up visits as told by your health care provider. This is important. Contact a health care provider if:  You keep feeling nauseous or you keep vomiting.  You feel light-headed.  You develop a rash.  You have a fever. Get help right away if:  You have trouble breathing. This information is not intended to replace advice given to you by your health care provider. Make sure you discuss any questions you have  with your health care provider. Document Released: 12/18/2012 Document Revised: 08/02/2015 Document Reviewed: 06/19/2015 Elsevier Interactive Patient Education  2019 [place], Adult An incision is a surgical cut that is made through your skin. Most incisions are closed after surgery. Your incision may be closed with stitches (sutures), staples, skin glue, or adhesive strips. You may need to return to your health care provider to have sutures or staples removed. This may occur several days to several weeks after your surgery. The incision needs to be cared for properly to prevent infection. How to care for your incision Incision care   Follow instructions from your health care provider about how to take care of your incision. Make sure you: ? Wash your hands with soap and water before you change the bandage (dressing). If soap and water are not available, use hand sanitizer. ? Change your dressing as told by your health care provider. ? Leave sutures, skin glue, or adhesive strips in place. These skin closures may need to stay in place for 2 weeks or longer. If adhesive strip edges start to loosen and curl up, you may trim the loose edges. Do not remove adhesive strips completely unless your health care provider tells you to do that.  Check your incision area every day for signs of infection. Check for: ? More redness, swelling, or pain. ? More fluid or blood. ? Warmth. ? Pus or a bad smell.  Ask your health care provider how to clean the incision. This may include: ? Using mild soap and water. ? Using a clean  towel to pat the incision dry after cleaning it. ? Applying a cream or ointment. Do this only as told by your health care provider. ? Covering the incision with a clean dressing.  Ask your health care provider when you can leave the incision uncovered.  Do not take baths, swim, or use a hot tub until your health care provider approves. Ask your health care  provider if you can take showers. You may only be allowed to take sponge baths for bathing. Medicines  If you were prescribed an antibiotic medicine, cream, or ointment, take or apply the antibiotic as told by your health care provider. Do not stop taking or applying the antibiotic even if your condition improves.  Take over-the-counter and prescription medicines only as told by your health care provider. General instructions  Limit movement around your incision to improve healing. ? Avoid straining, lifting, or exercise for the first month, or for as long as told by your health care provider. ? Follow instructions from your health care provider about returning to your normal activities. ? Ask your health care provider what activities are safe.  Protect your incision from the sun when you are outside for the first 6 months, or for as long as told by your health care provider. Apply sunscreen around the scar or cover it up.  Keep all follow-up visits as told by your health care provider. This is important. Contact a health care provider if:  Your have more redness, swelling, or pain around the incision.  You have more fluid or blood coming from the incision.  Your incision feels warm to the touch.  You have pus or a bad smell coming from the incision.  You have a fever or shaking chills.  You are nauseous or you vomit.  You are dizzy.  Your sutures or staples come undone. Get help right away if:  You have a red streak coming from your incision.  Your incision bleeds through the dressing and the bleeding does not stop with gentle pressure.  The edges of your incision open up and separate.  You have severe pain.  You have a rash.  You are confused.  You faint.  You have trouble breathing and a fast heartbeat. This information is not intended to replace advice given to you by your health care provider. Make sure you discuss any questions you have with your health care  provider. Document Released: 09/16/2004 Document Revised: 11/05/2015 Document Reviewed: 09/15/2015 Elsevier Interactive Patient Education  2019 [employer].

## 2018-05-30 ENCOUNTER — Other Ambulatory Visit: Payer: Self-pay

## 2018-05-31 ENCOUNTER — Other Ambulatory Visit: Payer: Medicare Other

## 2018-05-31 ENCOUNTER — Ambulatory Visit
Admission: RE | Admit: 2018-05-31 | Discharge: 2018-05-31 | Disposition: A | Discharge status: Home / Self Care | Payer: Medicare Other | Source: Ambulatory Visit | Attending: Oncology | Admitting: Oncology

## 2018-05-31 ENCOUNTER — Inpatient Hospital Stay: Payer: Medicare Other | Attending: Oncology

## 2018-05-31 ENCOUNTER — Other Ambulatory Visit: Payer: Self-pay

## 2018-05-31 DIAGNOSIS — C155 Malignant neoplasm of lower third of esophagus: Secondary | ICD-10-CM | POA: Insufficient documentation

## 2018-05-31 DIAGNOSIS — Z8501 Personal history of malignant neoplasm of esophagus: Secondary | ICD-10-CM | POA: Insufficient documentation

## 2018-05-31 DIAGNOSIS — Z08 Encounter for follow-up examination after completed treatment for malignant neoplasm: Secondary | ICD-10-CM | POA: Diagnosis not present

## 2018-05-31 LAB — COMPREHENSIVE METABOLIC PANEL
ALT: 17 U/L (ref 0–44)
ANION GAP: 7 (ref 5–15)
AST: 13 U/L — ABNORMAL LOW (ref 15–41)
Albumin: 4 g/dL (ref 3.5–5.0)
Alkaline Phosphatase: 93 U/L (ref 38–126)
BUN: 17 mg/dL (ref 8–23)
CO2: 25 mmol/L (ref 22–32)
Calcium: 8.9 mg/dL (ref 8.9–10.3)
Chloride: 107 mmol/L (ref 98–111)
Creatinine, Ser: 0.97 mg/dL (ref 0.61–1.24)
GFR calc Af Amer: >60 mL/min (ref >60)
GFR calc non Af Amer: >60 mL/min (ref >60)
Glucose, Bld: 88 mg/dL (ref 70–99)
Potassium: 4.5 mmol/L (ref 3.5–5.1)
Sodium: 139 mmol/L (ref 135–145)
Total Bilirubin: 1.1 mg/dL (ref 0.3–1.2)
Total Protein: 7.1 g/dL (ref 6.5–8.1)

## 2018-05-31 LAB — CBC WITH DIFFERENTIAL/PLATELET
Abs Immature Granulocytes: 0.01 10*3/uL (ref 0.00–0.07)
BASOS ABS: 0.1 10*3/uL (ref 0.0–0.1)
Basophils Relative: 2 %
Eosinophils Absolute: 0.1 10*3/uL (ref 0.0–0.5)
Eosinophils Relative: 2 %
HCT: 42.3 % (ref 39.0–52.0)
Hemoglobin: 14.3 g/dL (ref 13.0–17.0)
IMMATURE GRANULOCYTES: 0 %
Lymphocytes Relative: 47 %
Lymphs Abs: 2.7 10*3/uL (ref 0.7–4.0)
MCH: 30.2 pg (ref 26.0–34.0)
MCHC: 33.8 g/dL (ref 30.0–36.0)
MCV: 89.2 fL (ref 80.0–100.0)
Monocytes Absolute: 0.6 10*3/uL (ref 0.1–1.0)
Monocytes Relative: 10 %
NEUTROS ABS: 2.2 10*3/uL (ref 1.7–7.7)
NEUTROS PCT: 39 %
Platelets: 201 10*3/uL (ref 150–400)
RBC: 4.74 MIL/uL (ref 4.22–5.81)
RDW: 14.6 % (ref 11.5–15.5)
WBC: 5.7 10*3/uL (ref 4.0–10.5)
nRBC: 0 % (ref 0.0–0.2)

## 2018-05-31 IMAGING — CT CT CHEST WITH CONTRAST
3 of 5 series · 15 of 36 positions shown, 17 images · IV contrast (omnipaque)
Comparison: [DATE]

CLINICAL DATA: Esophageal cancer.

EXAM:
CT CHEST AND ABDOMEN WITH CONTRAST
TECHNIQUE: Multidetector CT imaging of the chest and abdomen was performed
following the standard protocol during bolus administration of
intravenous contrast.
CONTRAST:  75mL OMNIPAQUE IOHEXOL 300 MG/ML  SOLN

[Series 2: axials cap · axial · 0.75mm/px · z∈[-1362,-1017]mm · 7 of 93 slices shown, 9 images]
[im 12/93  mediastinal]
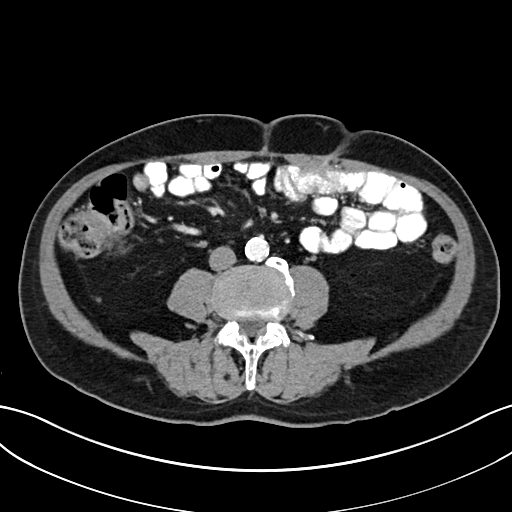
[im 12/93  lung]
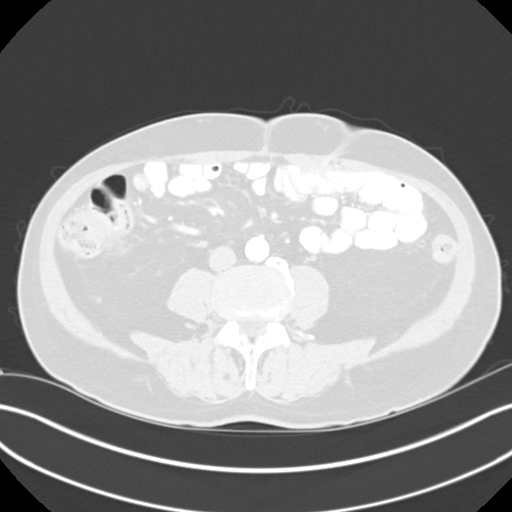
[im 24/93  lung]
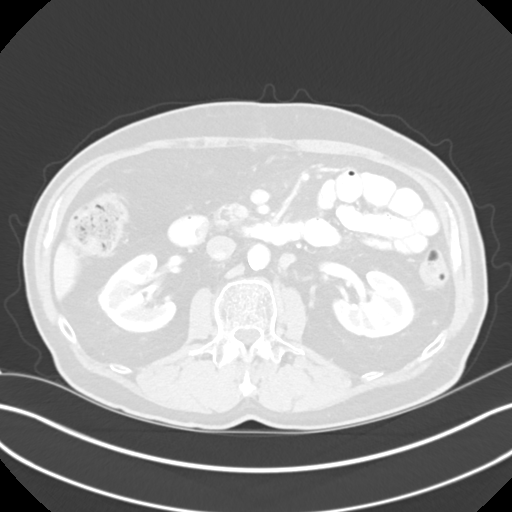
[im 35/93  lung]
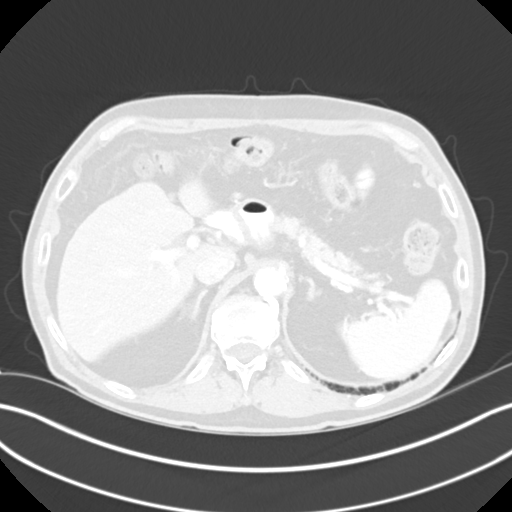
[im 47/93  lung]
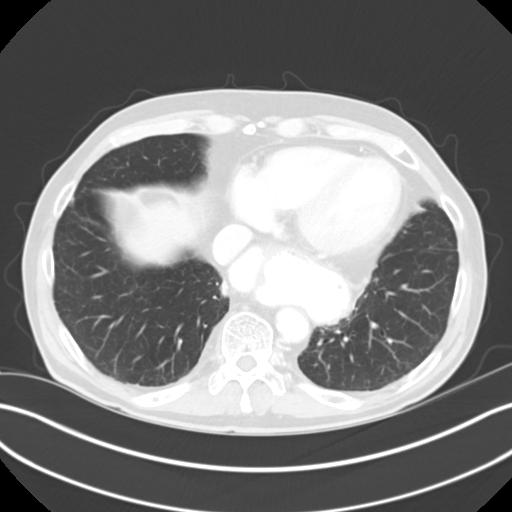
[im 58/93  mediastinal]
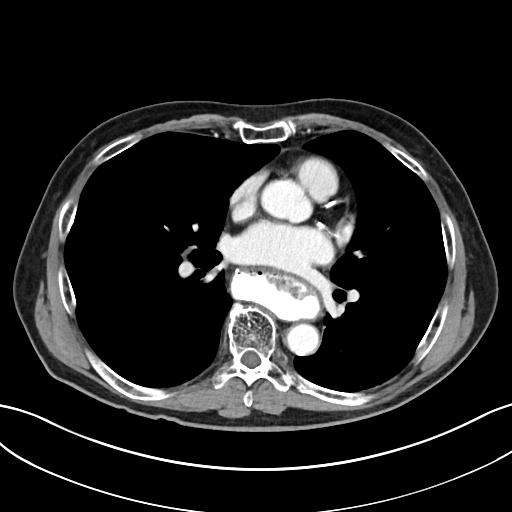
[im 58/93  lung]
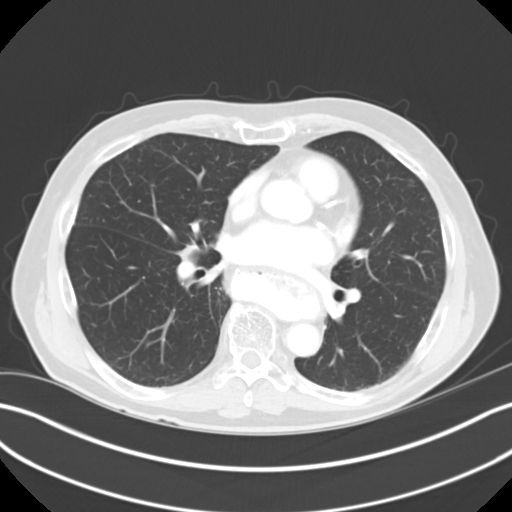
[im 70/93  lung]
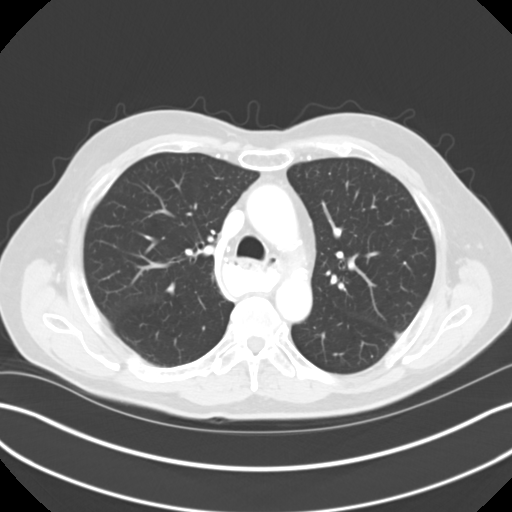
[im 81/93  lung]
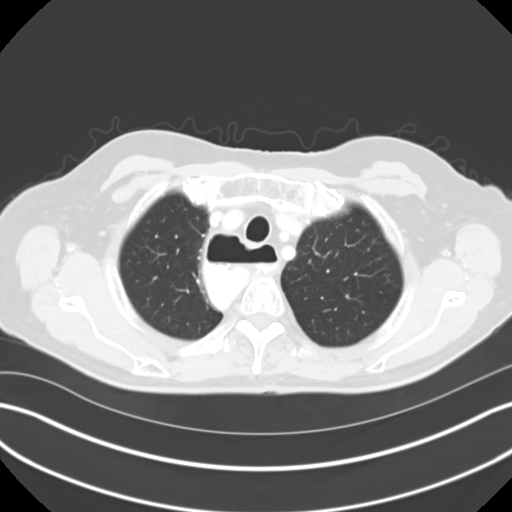

[Series 4: lungs cap · axial · 0.75mm/px · z∈[-1242,-1100]mm · 5 of 154 slices shown]
[im 11/154  lung]
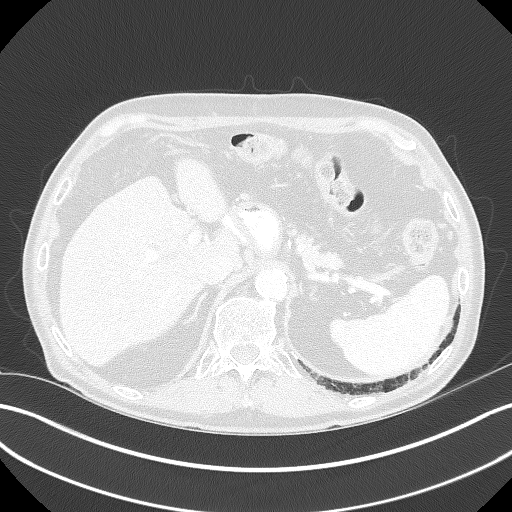
[im 31/154  lung]
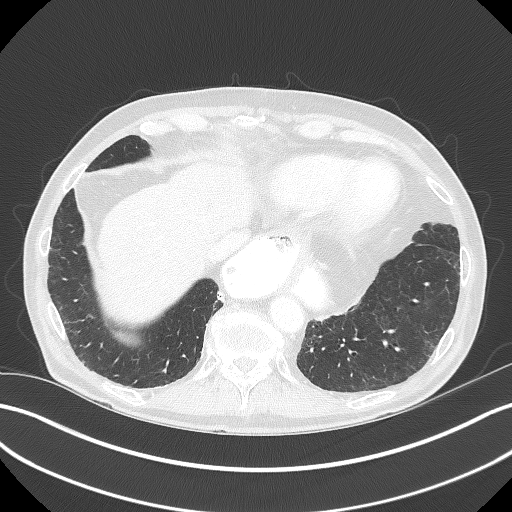
[im 52/154  lung]
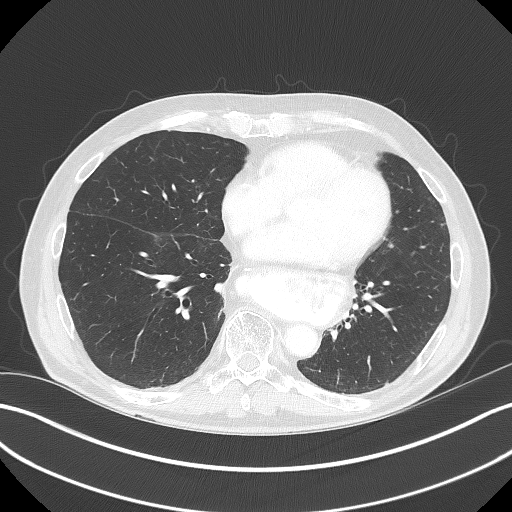
[im 72/154  lung]
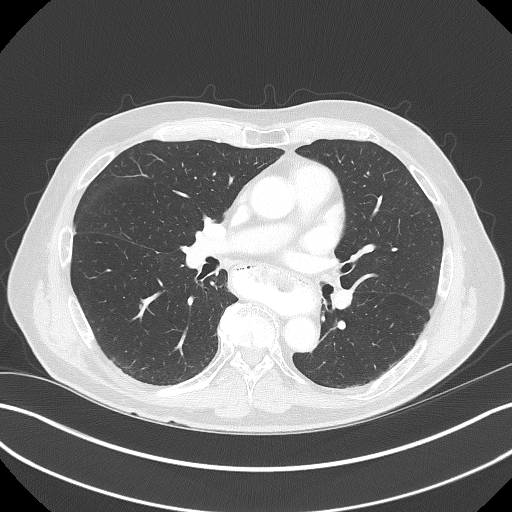
[im 82/154  lung]
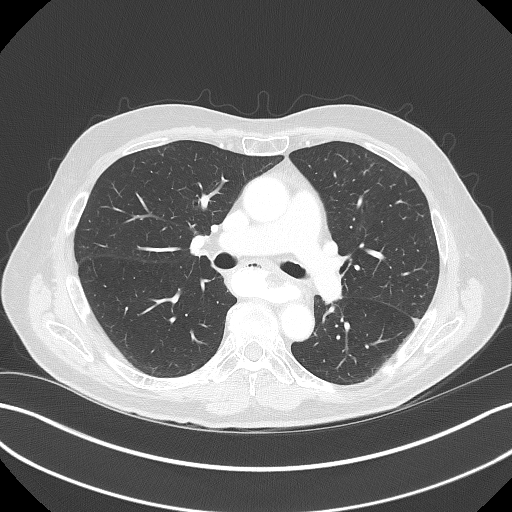

[Series 5: coronals cap · coronal · 0.75mm/px · 3 of 131 slices shown]
[im 27/131  lung]
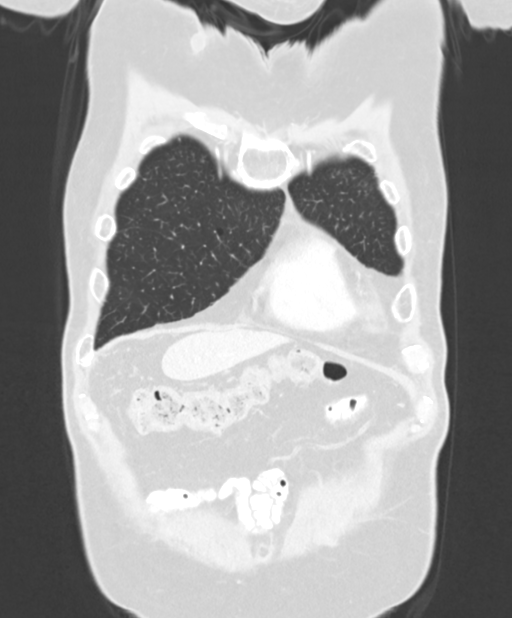
[im 53/131  lung]
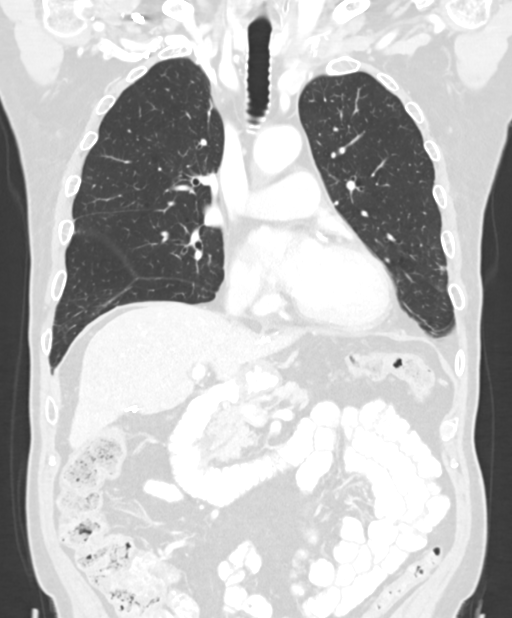
[im 79/131  lung]
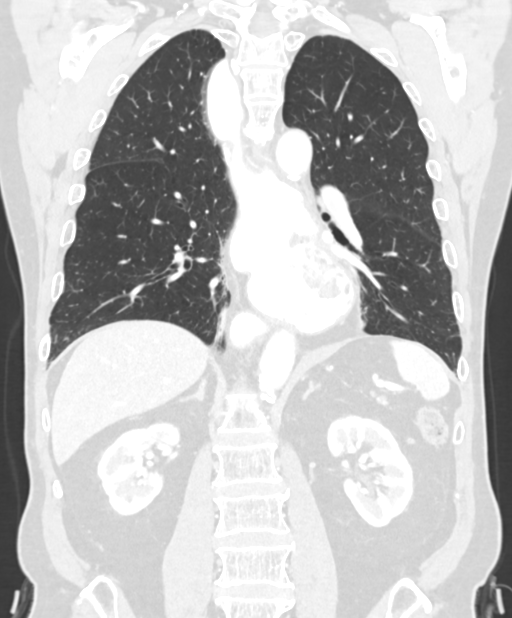

[15 of 36 positions shown; findings below may reference images not displayed]

FINDINGS: CT CHEST FINDINGS

Cardiovascular: The heart size is normal. No substantial pericardial
effusion. Coronary artery calcification is evident. Atherosclerotic
calcification is noted in the wall of the thoracic aorta.

Mediastinum/Nodes: No mediastinal lymphadenopathy. There is no hilar
lymphadenopathy. Status post gastric pull-through procedure. No
abnormal soft tissue at the proximal anastomosis. There is no
axillary lymphadenopathy.

Lungs/Pleura: The central tracheobronchial airways are patent.
Calcified granuloma noted medial right upper lobe. Small area of
tree-in-bud nodularity in the peripheral right upper lobe likely
reflects atypical infection. Small peripheral nodules in the left
upper lobe are stable in the interval. No suspicious nodule or mass.
No pleural effusion.

Musculoskeletal: No worrisome lytic or sclerotic osseous
abnormality. Stable appearance T11 compression fracture.

CT ABDOMEN FINDINGS

Hepatobiliary: Small cyst in the attic dome is stable. Liver
parenchyma otherwise unremarkable. Gallbladder surgically absent. No
intrahepatic or extrahepatic biliary dilation.

Pancreas: No focal mass lesion. No dilatation of the main duct. No
intraparenchymal cyst. No peripancreatic edema.

Spleen: No splenomegaly. No focal mass lesion. Calcified granulomata
evident.

Adrenals/Urinary Tract: No adrenal nodule or mass. Kidneys
unremarkable.

Stomach/Bowel: Status post gastric pull-through. Duodenum
unremarkable. Visualized small bowel loops and colonic segments of
the abdomen are nondilated.

Vascular/Lymphatic: There is abdominal aortic atherosclerosis
without aneurysm. There is no gastrohepatic or hepatoduodenal
ligament lymphadenopathy. No intraperitoneal or retroperitoneal
lymphadenopathy.

Other: No intraperitoneal free fluid.

Musculoskeletal: No worrisome lytic or sclerotic osseous
abnormality.
IMPRESSION: 1. Status post esophagectomy with gastric pull-through. No evidence
for local recurrence or metastatic disease in the chest/abdomen.
2. New subtle peripheral tree-in-bud nodularity right upper and
middle lobes. Imaging features suggest sequelae of atypical
infection.
3. Stable calcified right upper lobe pulmonary nodule.
4. Stable T11 compression fracture.
5.  Aortic Atherosclerois ([1F]-170.0)
6.  Emphysema. ([1F]-[1F])

## 2018-05-31 MED ORDER — IOHEXOL 300 MG/ML  SOLN
75.0000 mL | Freq: Once | INTRAMUSCULAR | Status: AC | PRN
  Administered 2018-05-31: 75 mL via INTRAVENOUS

## 2018-06-02 ENCOUNTER — Other Ambulatory Visit: Payer: Self-pay | Admitting: *Deleted

## 2018-06-02 DIAGNOSIS — C155 Malignant neoplasm of lower third of esophagus: Secondary | ICD-10-CM

## 2018-06-04 ENCOUNTER — Inpatient Hospital Stay: Payer: Medicare Other | Admitting: Oncology

## 2018-06-04 ENCOUNTER — Ambulatory Visit: Payer: Medicare Other | Admitting: Oncology

## 2018-09-05 ENCOUNTER — Ambulatory Visit: Payer: Medicare Other | Admitting: Cardiothoracic Surgery

## 2018-09-25 ENCOUNTER — Other Ambulatory Visit: Payer: Self-pay

## 2018-09-26 ENCOUNTER — Ambulatory Visit (INDEPENDENT_AMBULATORY_CARE_PROVIDER_SITE_OTHER): Payer: Medicare Other | Admitting: Cardiothoracic Surgery

## 2018-09-26 VITALS — BP 128/78 | HR 78 | Temp 97.7°F | Resp 20 | Ht 69.0 in | Wt 170.0 lb

## 2018-09-26 DIAGNOSIS — Z9049 Acquired absence of other specified parts of digestive tract: Secondary | ICD-10-CM | POA: Diagnosis not present

## 2018-09-26 DIAGNOSIS — Z9889 Other specified postprocedural states: Secondary | ICD-10-CM | POA: Diagnosis not present

## 2018-09-26 NOTE — Progress Notes (Signed)
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 SURGICAL PROCEDURES:  Video bronchoscopy, transhiatal total esophagectomy with cervical esophagogastrostomy, pyloromyotomy. SURGEON:  Lanelle Bal, MD.   Cancer Staging Malignant neoplasm of lower third of esophagus [facility]) Staging form: Esophagus - Adenocarcinoma, AJCC 8th Edition - Clinical stage from 01/04/2017: Stage Unknown (cTX, cN0, cM0) - Signed by Sindy Guadeloupe, MD on 01/04/2017 - Pathologic stage from 06/22/2017: Stage Unknown (ypTX, pN1, cM0, GX) - Signed by Grace Isaac, MD on 06/22/2017   History of Present Illness:  Patient returns to the office today in follow-up after transhiatal total esophagectomy now 15 months postop.  Patient is maintaining his diet well.  Denies any problems taking a p.o. diet.  Maintaining his weight adequately.  Follow-up CT scan done approximately 1 year postop shows no evidence of recurrence.  Final path: Diagnosis 1. Lymph node, biopsy, omental - FIBROADIPOSE TISSUE - NO MALIGNANCY IDENTIFIED 2. Esophagogastrectomy - ESOPHAGUS WITH NO RESIDUAL CARCINOMA IDENTIFIED (STATUS POST NEOADJUVANT THERAPY) - FIBROSIS AND INFLAMMATION CONSISTENT WITH TREATMENT EFFECT - METASTATIC CARCINOMA INVOLVING ONE OF FIVE LYMPH NODES (1/5) - INTESTINAL METAPLASIA PRESENT AT THE GASTRIC MARGIN - SEE COMMENT Microscopic Comment 2. An oncology table was not completed  because there was no residual carcinoma identified in esophagus consistent with a complete response (Score 0) to neoadjuvant therapy. Based on the current specimen, this would be staged as ypT0 ypN1. Dr. Saralyn Pilar reviewed the case and agrees with the above diagnosis. DAWN BUTLER MD  Wt Readings from Last 3 Encounters:  09/26/18 170 lb (77.1 kg)  03/22/18 160 lb (72.6 kg)  02/28/18 163 lb 6.4 oz (74.1 kg)    Zubrod Score: At the time of surgery this patient's most appropriate activity status/level should be described as: [x]     0    Normal activity, no symptoms []     1    Restricted in physical strenuous activity but ambulatory, able to do out light work []     2    Ambulatory and capable of self care, unable to do work activities, up and about                 >50 % of waking hours                                                                                   []     3    Only limited self care, in bed greater than 50% of waking hours []     4    Completely disabled, no self  care, confined to bed or chair []     5    Moribund  Social History   Tobacco Use  Smoking Status Never Smoker  Smokeless Tobacco Never Used       No Known Allergies  No current outpatient medications on file.   No current facility-administered medications for this visit.    Facility-Administered Medications Ordered in Other Visits  Medication Dose Route Frequency Provider Last Rate Last Dose  . 0.9 %  sodium chloride infusion   Intravenous Once Sindy Guadeloupe, MD      . ondansetron (ZOFRAN) 8 mg, dexamethasone (DECADRON) 10 mg in sodium chloride 0.9 % 50 mL IVPB   Intravenous Once Sindy Guadeloupe, MD           Physical Exam: BP 128/78   Pulse 78   Temp 97.7 F (36.5 C) (Skin)   Resp 20   Ht 5\' 9"  (1.753 m)   Wt 170 lb (77.1 kg)   SpO2 96% Comment: RA  BMI 25.10 kg/m   General appearance: alert, cooperative and no distress Head: Normocephalic, without obvious abnormality, atraumatic Neck: no  adenopathy, no carotid bruit, no JVD, supple, symmetrical, trachea midline and thyroid not enlarged, symmetric, no tenderness/mass/nodules Lymph nodes: Cervical, supraclavicular, and axillary nodes normal. Resp: clear to auscultation bilaterally Cardio: regular rate and rhythm, S1, S2 normal, no murmur, click, rub or gallop GI: soft, non-tender; bowel sounds normal; no masses,  no organomegaly Extremities: extremities normal, atraumatic, no cyanosis or edema Neurologic: Grossly normal Left neck incision and abdominal incisions are healed well there is no palpable adenopathy in the neck.  Diagnostic Studies & Laboratory data:         Recent Radiology Findings: No results found.   Recent Labs: Lab Results  Component Value Date   WBC 5.7 05/31/2018   HGB 14.3 05/31/2018   HCT 42.3 05/31/2018   PLT 201 05/31/2018   GLUCOSE 88 05/31/2018   TRIG 92 12/11/2016   ALT 17 05/31/2018   AST 13 (L) 05/31/2018   NA 139 05/31/2018   K 4.5 05/31/2018   CL 107 05/31/2018   CREATININE 0.97 05/31/2018   BUN 17 05/31/2018   CO2 25 05/31/2018   INR 0.98 03/22/2018      Assessment / Plan:   Patient doing well following treatment for carcinoma of the esophagus with surgical resection done 15 months ago.  The patient appears to be doing extremely well remains active without symptomatic or radiographic evidence of recurrence.  We will seen approximately 6 months.  He notes that he is to have a follow-up CT scan done in September of this year.  Grace Isaac 09/26/2018 10:36 AM

## 2018-11-27 ENCOUNTER — Other Ambulatory Visit: Payer: Self-pay

## 2018-11-28 ENCOUNTER — Other Ambulatory Visit: Payer: Self-pay

## 2018-11-28 ENCOUNTER — Encounter: Payer: Self-pay | Admitting: Oncology

## 2018-11-28 ENCOUNTER — Ambulatory Visit
Admission: RE | Admit: 2018-11-28 | Discharge: 2018-11-28 | Disposition: A | Discharge status: Home / Self Care | Payer: Medicare Other | Source: Ambulatory Visit | Attending: Oncology | Admitting: Oncology

## 2018-11-28 ENCOUNTER — Inpatient Hospital Stay: Payer: Medicare Other | Attending: Oncology

## 2018-11-28 DIAGNOSIS — Z8249 Family history of ischemic heart disease and other diseases of the circulatory system: Secondary | ICD-10-CM | POA: Insufficient documentation

## 2018-11-28 DIAGNOSIS — Z8501 Personal history of malignant neoplasm of esophagus: Secondary | ICD-10-CM | POA: Insufficient documentation

## 2018-11-28 DIAGNOSIS — Z9221 Personal history of antineoplastic chemotherapy: Secondary | ICD-10-CM | POA: Insufficient documentation

## 2018-11-28 DIAGNOSIS — C155 Malignant neoplasm of lower third of esophagus: Secondary | ICD-10-CM | POA: Insufficient documentation

## 2018-11-28 DIAGNOSIS — Z923 Personal history of irradiation: Secondary | ICD-10-CM | POA: Insufficient documentation

## 2018-11-28 DIAGNOSIS — R1310 Dysphagia, unspecified: Secondary | ICD-10-CM | POA: Insufficient documentation

## 2018-11-28 DIAGNOSIS — Z87891 Personal history of nicotine dependence: Secondary | ICD-10-CM | POA: Insufficient documentation

## 2018-11-28 LAB — CBC WITH DIFFERENTIAL/PLATELET
Abs Immature Granulocytes: 0.03 10*3/uL (ref 0.00–0.07)
Basophils Absolute: 0.1 10*3/uL (ref 0.0–0.1)
Basophils Relative: 2 %
Eosinophils Absolute: 0.2 10*3/uL (ref 0.0–0.5)
Eosinophils Relative: 3 %
HCT: 44.8 % (ref 39.0–52.0)
Hemoglobin: 14.8 g/dL (ref 13.0–17.0)
Immature Granulocytes: 0 %
Lymphocytes Relative: 36 %
Lymphs Abs: 2.6 10*3/uL (ref 0.7–4.0)
MCH: 29.8 pg (ref 26.0–34.0)
MCHC: 33 g/dL (ref 30.0–36.0)
MCV: 90.1 fL (ref 80.0–100.0)
Monocytes Absolute: 0.5 10*3/uL (ref 0.1–1.0)
Monocytes Relative: 7 %
Neutro Abs: 3.8 10*3/uL (ref 1.7–7.7)
Neutrophils Relative %: 52 %
Platelets: 231 10*3/uL (ref 150–400)
RBC: 4.97 MIL/uL (ref 4.22–5.81)
RDW: 13.9 % (ref 11.5–15.5)
WBC: 7.3 10*3/uL (ref 4.0–10.5)
nRBC: 0 % (ref 0.0–0.2)

## 2018-11-28 LAB — COMPREHENSIVE METABOLIC PANEL
ALT: 18 U/L (ref 0–44)
AST: 14 U/L — ABNORMAL LOW (ref 15–41)
Albumin: 4.3 g/dL (ref 3.5–5.0)
Alkaline Phosphatase: 84 U/L (ref 38–126)
Anion gap: 9 (ref 5–15)
BUN: 16 mg/dL (ref 8–23)
CO2: 24 mmol/L (ref 22–32)
Calcium: 9 mg/dL (ref 8.9–10.3)
Chloride: 107 mmol/L (ref 98–111)
Creatinine, Ser: 0.94 mg/dL (ref 0.61–1.24)
GFR calc Af Amer: >60 mL/min (ref >60)
GFR calc non Af Amer: >60 mL/min (ref >60)
Glucose, Bld: 96 mg/dL (ref 70–99)
Potassium: 4.2 mmol/L (ref 3.5–5.1)
Sodium: 140 mmol/L (ref 135–145)
Total Bilirubin: 1 mg/dL (ref 0.3–1.2)
Total Protein: 7.6 g/dL (ref 6.5–8.1)

## 2018-11-28 IMAGING — CT CT ABD-PELV W/ CM
2 of 5 series · 13 of 46 positions shown, 15 images · IV contrast (omnipaque)
Comparison: [DATE], [DATE]

CLINICAL DATA: Restaging esophageal cancer, status post neoadjuvant
chemo radiation and esophagectomy [DATE]

EXAM:
CT CHEST, ABDOMEN, AND PELVIS WITH CONTRAST
TECHNIQUE: Multidetector CT imaging of the chest, abdomen and pelvis was
performed following the standard protocol during bolus
administration of intravenous contrast.
CONTRAST:  85mL OMNIPAQUE IOHEXOL 300 MG/ML SOLN, additional oral
enteric contrast

[Series 2: axials cap · axial · 0.73mm/px · z∈[-1510,-955]mm · 10 of 137 slices shown, 12 images]
[im 13/137  soft-tissue]
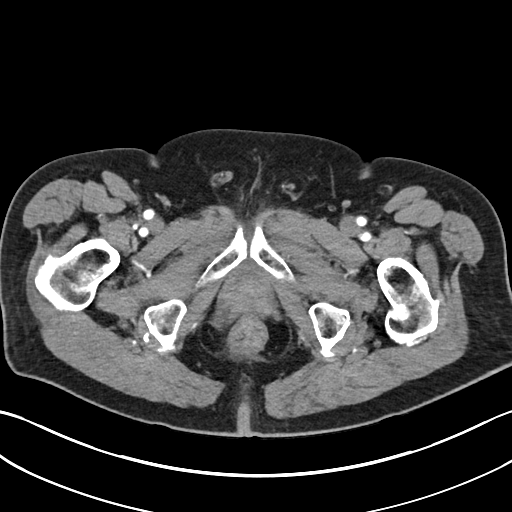
[im 13/137  bone]
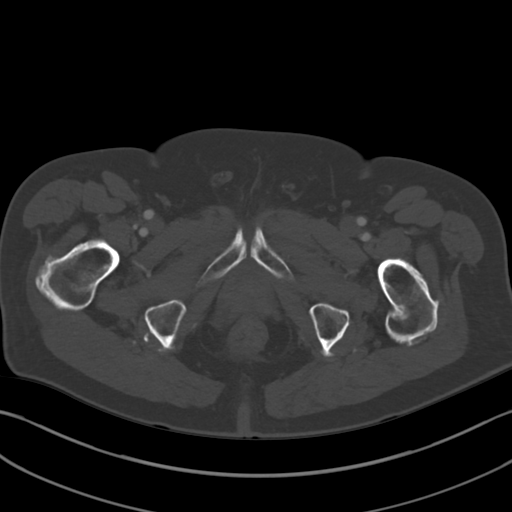
[im 25/137  soft-tissue]
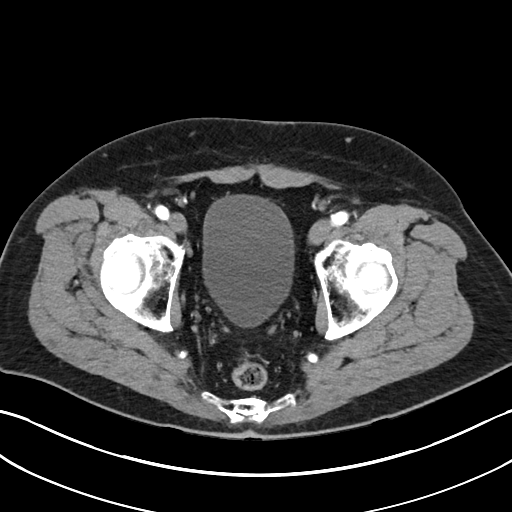
[im 38/137  soft-tissue]
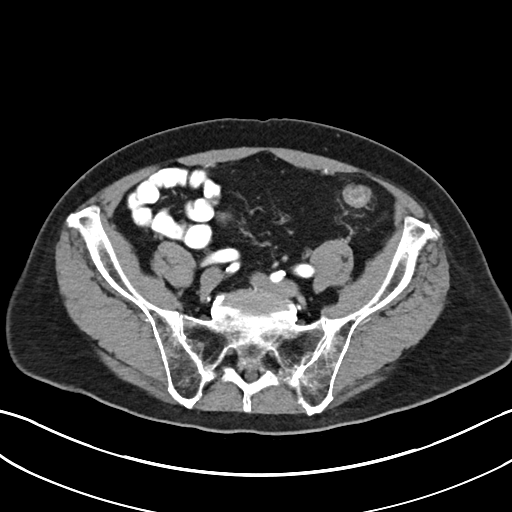
[im 50/137  soft-tissue]
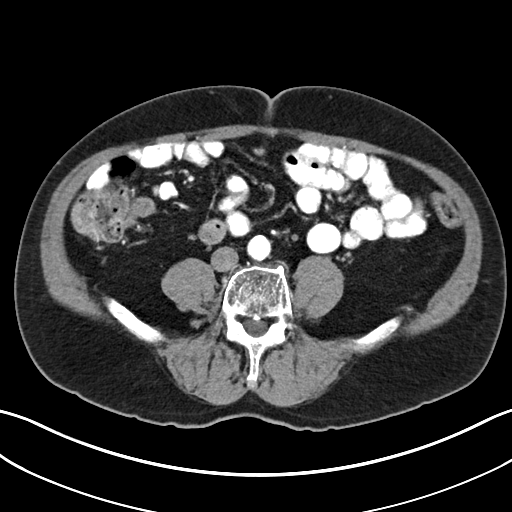
[im 62/137  soft-tissue]
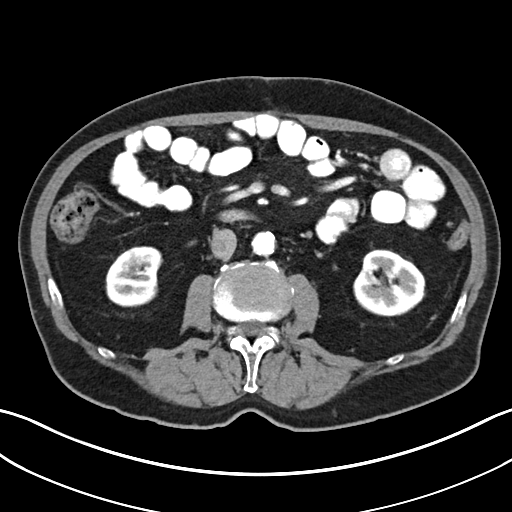
[im 75/137  soft-tissue]
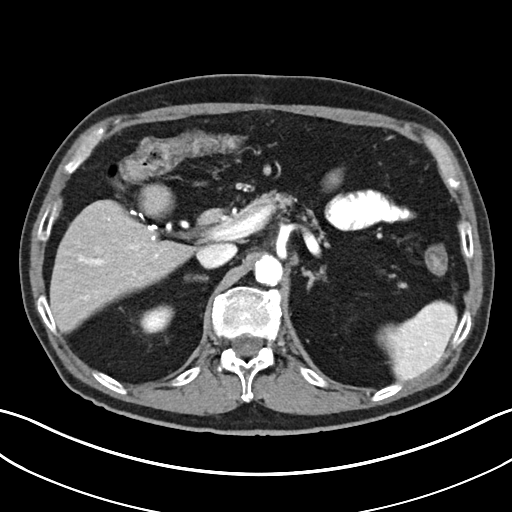
[im 87/137  soft-tissue]
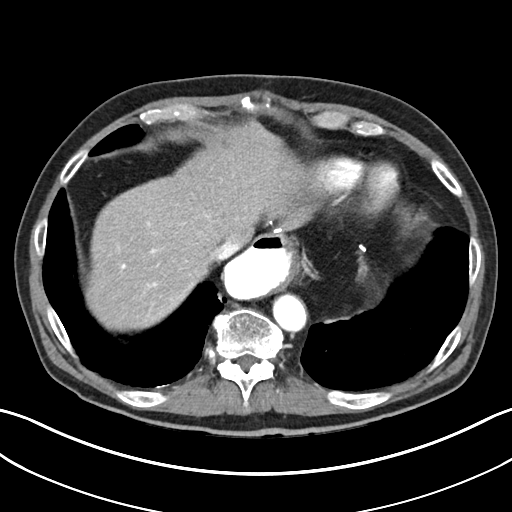
[im 99/137  soft-tissue]
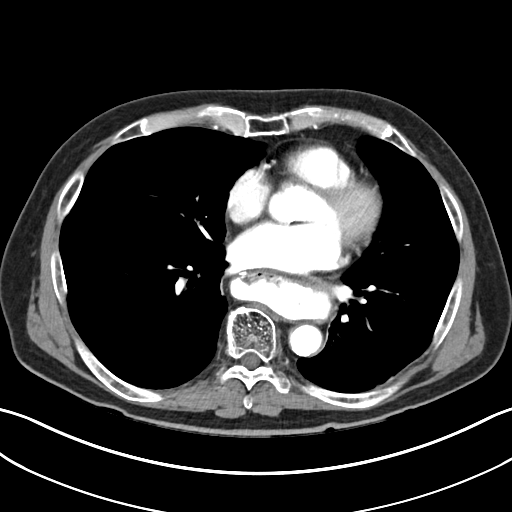
[im 112/137  soft-tissue]
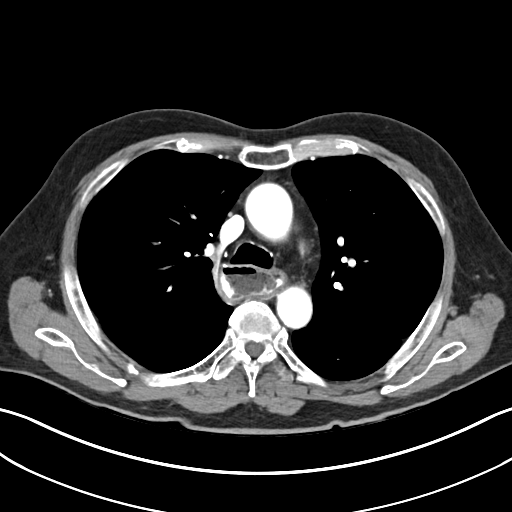
[im 112/137  bone]
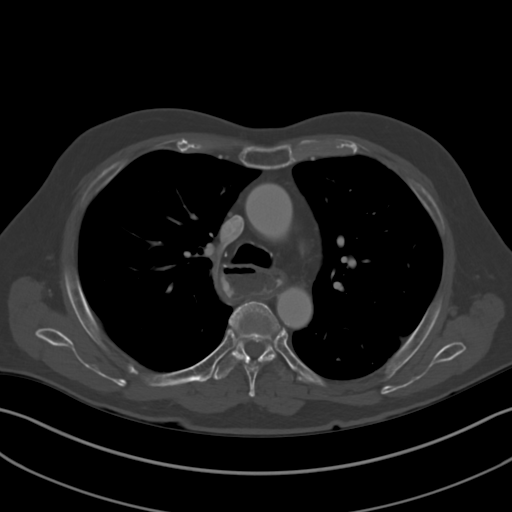
[im 124/137  soft-tissue]
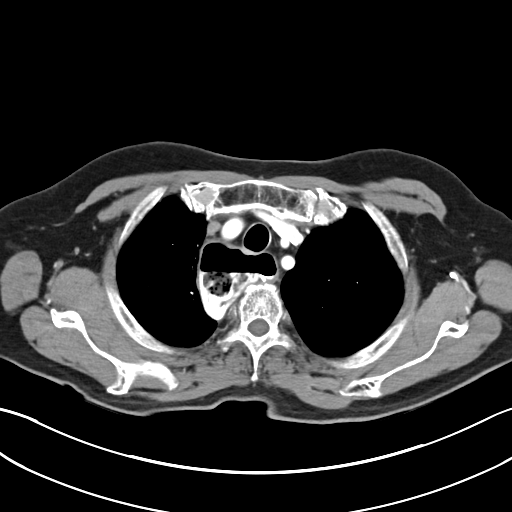

[Series 4: coronals cap · coronal · 0.73mm/px · 3 of 136 slices shown]
[im 46/136  soft-tissue]
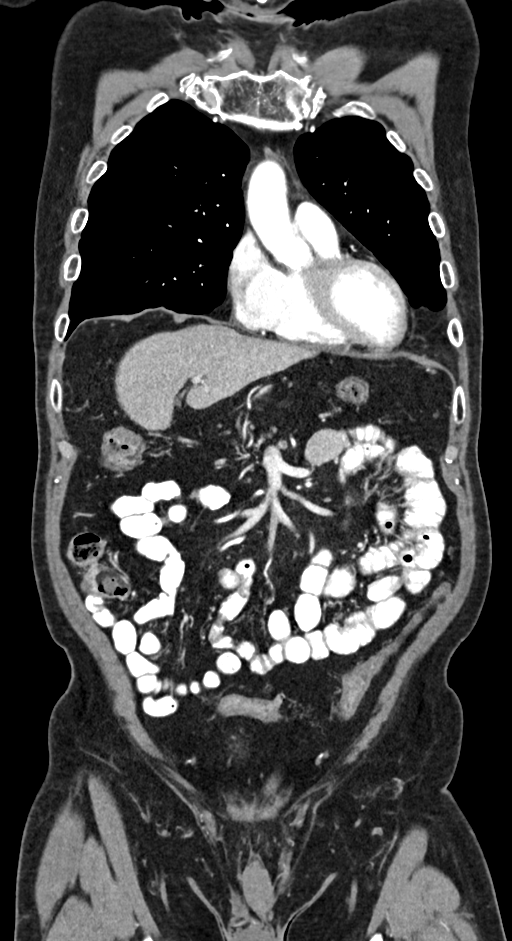
[im 61/136  soft-tissue]
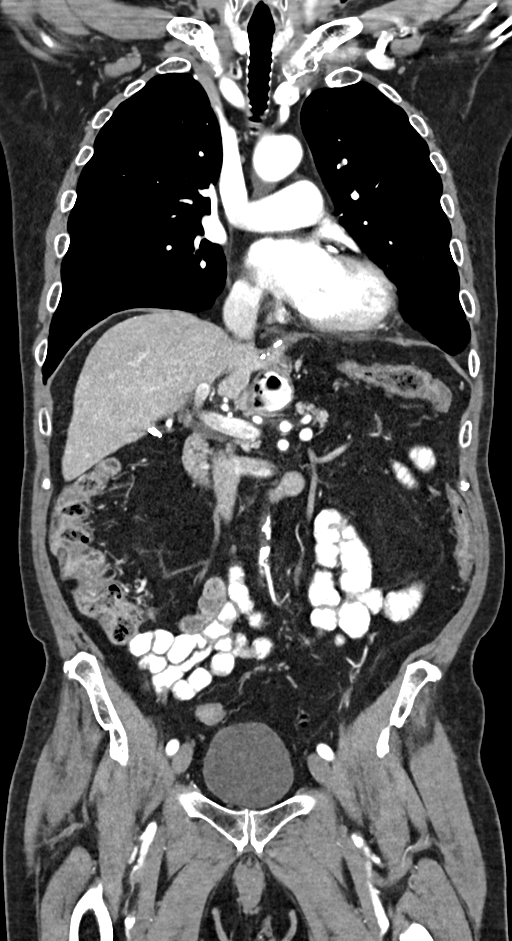
[im 76/136  soft-tissue]
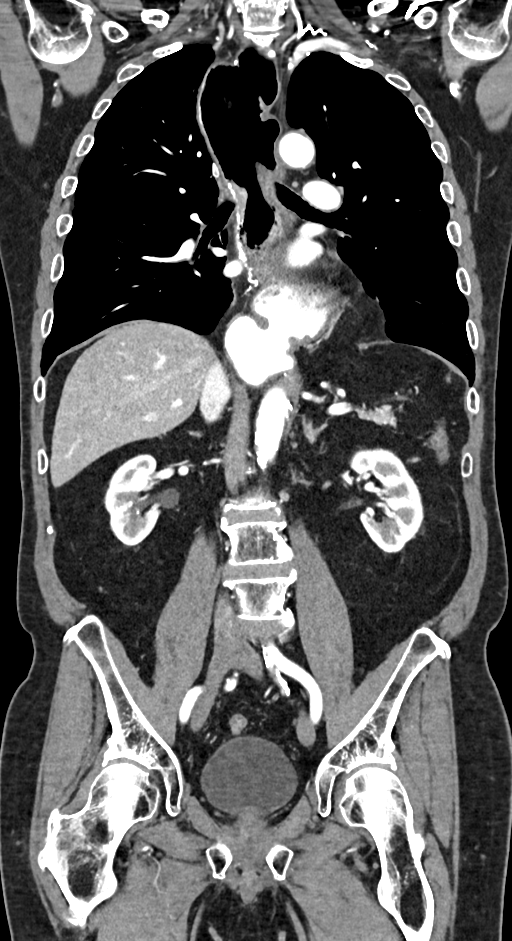

[13 of 46 positions shown; findings below may reference images not displayed]

FINDINGS: CT CHEST FINDINGS

Cardiovascular: Aortic valve calcifications. Aortic atherosclerosis.
Normal heart size. Extensive 3 vessel coronary artery
calcifications. No pericardial effusion.

Mediastinum/Nodes: No enlarged mediastinal, hilar, or axillary lymph
nodes. Calcified right hilar and mediastinal lymph nodes. Thyroid
gland and trachea demonstrate no significant findings. Status post
gastric pull-through esophagectomy. Unchanged appearance of the
proximal anastomosis without evidence of recurrent mass or soft
tissue.

Lungs/Pleura: No significant change in small clustered centrilobular
and ground-glass nodules of the lingula (series 3, image 102) and
lateral segment right middle lobe (series 3, image 107). Calcified,
benign pulmonary nodule of the medial right upper lobe. No pleural
effusion or pneumothorax.

Musculoskeletal: No chest wall mass or suspicious bone lesions
identified. Unchanged high-grade wedge deformity of T11.

CT ABDOMEN PELVIS FINDINGS

Hepatobiliary: No focal liver abnormality is seen. Status post
cholecystectomy. No biliary dilatation.

Pancreas: Unremarkable. No pancreatic ductal dilatation or
surrounding inflammatory changes.

Spleen: Normal in size without significant abnormality.

Adrenals/Urinary Tract: Adrenal glands are unremarkable. Multiple
tiny nonobstructive bilateral renal calculi. Kidneys are otherwise
normal, without renal calculi, solid lesion, or hydronephrosis.
Bladder is unremarkable.

Stomach/Bowel: Status post gastric pull-through esophagectomy.
Appendix appears normal. No evidence of bowel wall thickening,
distention, or inflammatory changes.

Vascular/Lymphatic: Aortic atherosclerosis. No enlarged abdominal or
pelvic lymph nodes.

Reproductive: Prostatomegaly.

Other: No abdominal wall hernia or abnormality. No abdominopelvic
ascites.

Musculoskeletal: No acute or significant osseous findings.
IMPRESSION: 1. Unchanged postoperative findings of gastric pull-through
esophagectomy. No evidence of locally recurrent or metastatic
disease in the chest, abdomen, or pelvis.

2. No significant change in small clustered centrilobular and
ground-glass nodules of the lingula (series 3, image 102) and
lateral segment right middle lobe (series 3, image 107). These
remain nonspecific, infectious or inflammatory, and likely related
to atypical infection.

3. Other chronic, incidental, and postoperative findings as detailed
above.

4.  Aortic Atherosclerosis ([SQ]-[SQ]).

## 2018-11-28 IMAGING — CT CT CHEST W/ CM
2 of 4 series · 12 of 36 positions shown, 15 images · IV contrast (omnipaque)
Comparison: [DATE], [DATE]

CLINICAL DATA: Restaging esophageal cancer, status post neoadjuvant
chemo radiation and esophagectomy [DATE]

EXAM:
CT CHEST, ABDOMEN, AND PELVIS WITH CONTRAST
TECHNIQUE: Multidetector CT imaging of the chest, abdomen and pelvis was
performed following the standard protocol during bolus
administration of intravenous contrast.
CONTRAST:  85mL OMNIPAQUE IOHEXOL 300 MG/ML SOLN, additional oral
enteric contrast

[Series 2: axials cap · axial · 0.73mm/px · z∈[-1515,-950]mm · 9 of 137 slices shown, 12 images]
[im 12/137  mediastinal]
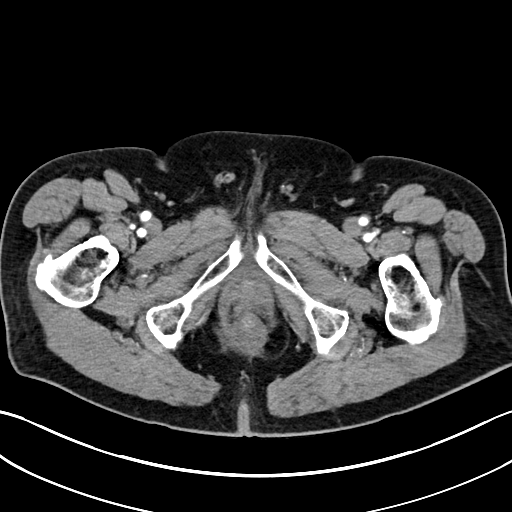
[im 12/137  lung]
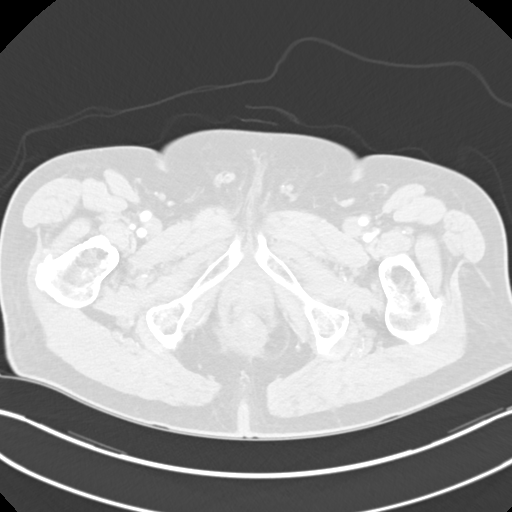
[im 23/137  lung]
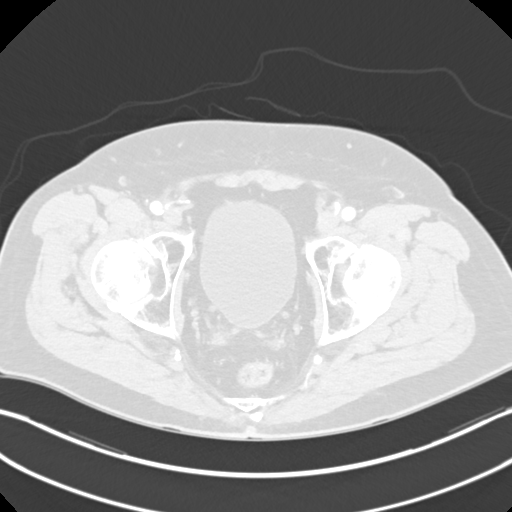
[im 46/137  lung]
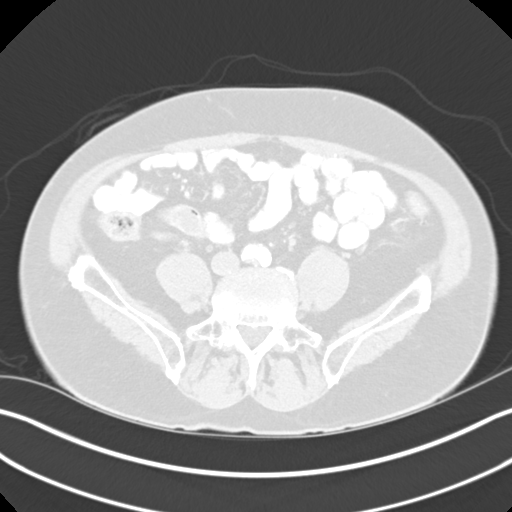
[im 57/137  lung]
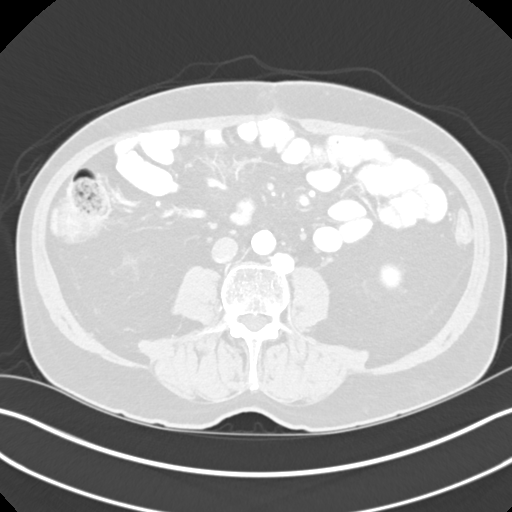
[im 69/137  mediastinal]
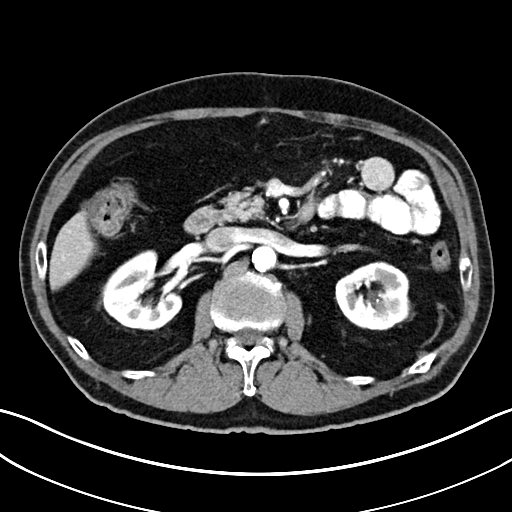
[im 69/137  lung]
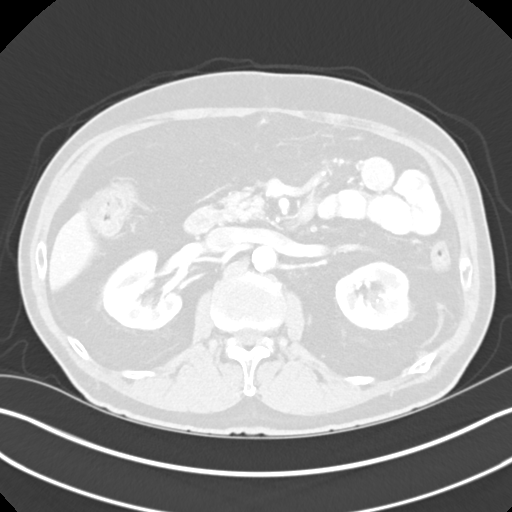
[im 80/137  lung]
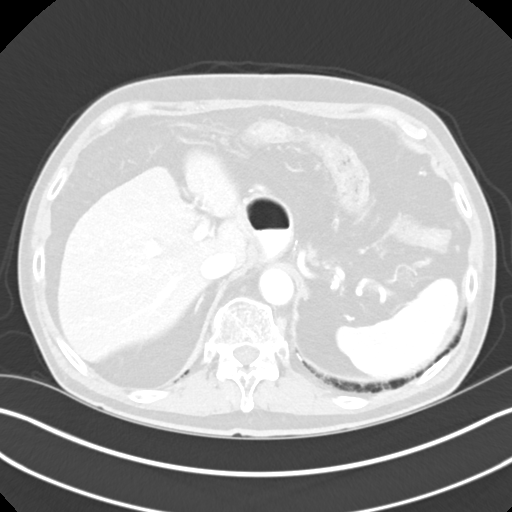
[im 91/137  lung]
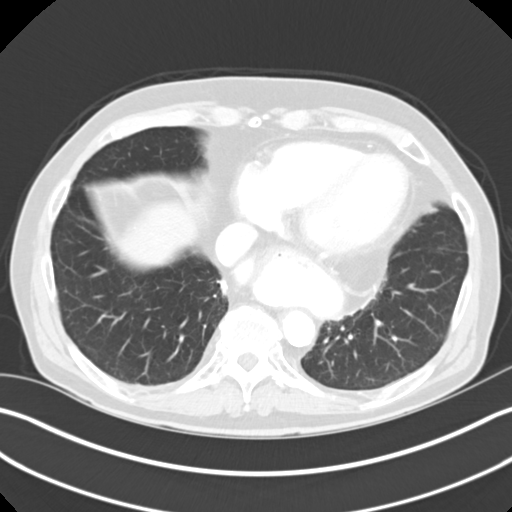
[im 114/137  lung]
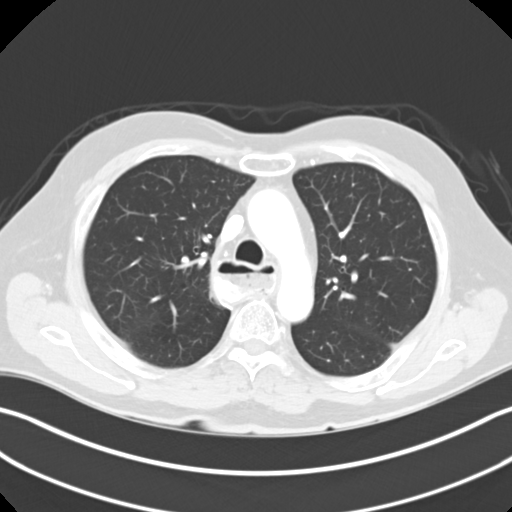
[im 125/137  mediastinal]
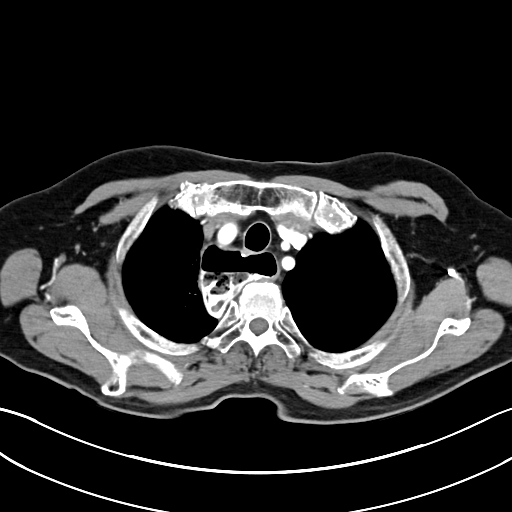
[im 125/137  lung]
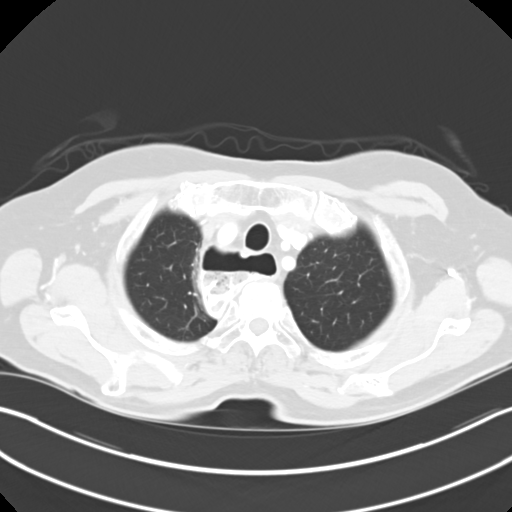

[Series 4: coronals cap · coronal · 0.73mm/px · 3 of 136 slices shown]
[im 28/136  lung]
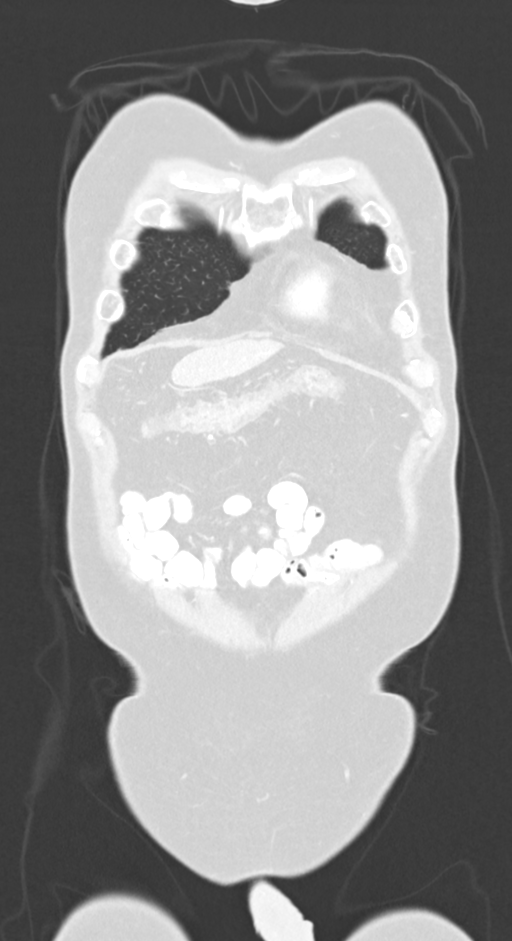
[im 55/136  lung]
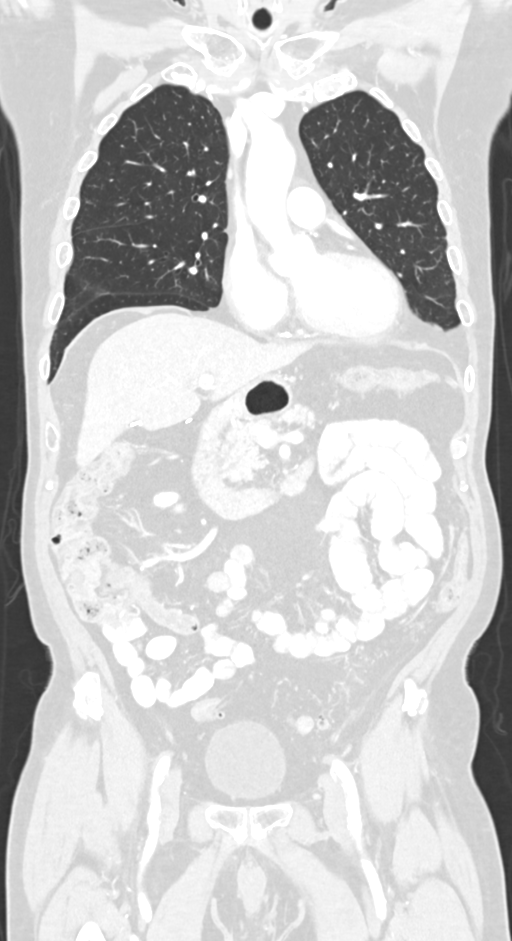
[im 82/136  lung]
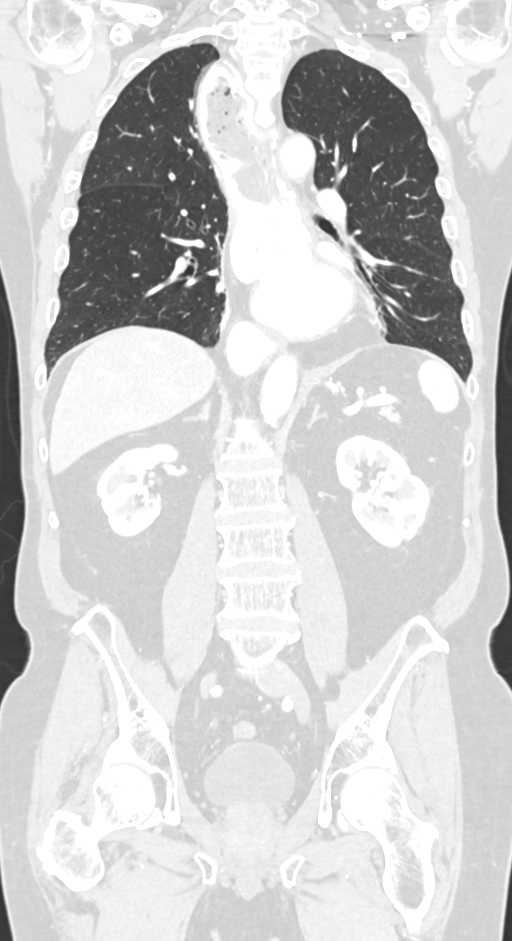

[12 of 36 positions shown; findings below may reference images not displayed]

FINDINGS: CT CHEST FINDINGS

Cardiovascular: Aortic valve calcifications. Aortic atherosclerosis.
Normal heart size. Extensive 3 vessel coronary artery
calcifications. No pericardial effusion.

Mediastinum/Nodes: No enlarged mediastinal, hilar, or axillary lymph
nodes. Calcified right hilar and mediastinal lymph nodes. Thyroid
gland and trachea demonstrate no significant findings. Status post
gastric pull-through esophagectomy. Unchanged appearance of the
proximal anastomosis without evidence of recurrent mass or soft
tissue.

Lungs/Pleura: No significant change in small clustered centrilobular
and ground-glass nodules of the lingula (series 3, image 102) and
lateral segment right middle lobe (series 3, image 107). Calcified,
benign pulmonary nodule of the medial right upper lobe. No pleural
effusion or pneumothorax.

Musculoskeletal: No chest wall mass or suspicious bone lesions
identified. Unchanged high-grade wedge deformity of T11.

CT ABDOMEN PELVIS FINDINGS

Hepatobiliary: No focal liver abnormality is seen. Status post
cholecystectomy. No biliary dilatation.

Pancreas: Unremarkable. No pancreatic ductal dilatation or
surrounding inflammatory changes.

Spleen: Normal in size without significant abnormality.

Adrenals/Urinary Tract: Adrenal glands are unremarkable. Multiple
tiny nonobstructive bilateral renal calculi. Kidneys are otherwise
normal, without renal calculi, solid lesion, or hydronephrosis.
Bladder is unremarkable.

Stomach/Bowel: Status post gastric pull-through esophagectomy.
Appendix appears normal. No evidence of bowel wall thickening,
distention, or inflammatory changes.

Vascular/Lymphatic: Aortic atherosclerosis. No enlarged abdominal or
pelvic lymph nodes.

Reproductive: Prostatomegaly.

Other: No abdominal wall hernia or abnormality. No abdominopelvic
ascites.

Musculoskeletal: No acute or significant osseous findings.
IMPRESSION: 1. Unchanged postoperative findings of gastric pull-through
esophagectomy. No evidence of locally recurrent or metastatic
disease in the chest, abdomen, or pelvis.

2. No significant change in small clustered centrilobular and
ground-glass nodules of the lingula (series 3, image 102) and
lateral segment right middle lobe (series 3, image 107). These
remain nonspecific, infectious or inflammatory, and likely related
to atypical infection.

3. Other chronic, incidental, and postoperative findings as detailed
above.

4.  Aortic Atherosclerosis ([SQ]-[SQ]).

## 2018-11-28 MED ORDER — IOHEXOL 300 MG/ML  SOLN
85.0000 mL | Freq: Once | INTRAMUSCULAR | Status: AC | PRN
  Administered 2018-11-28: 85 mL via INTRAVENOUS

## 2018-11-28 NOTE — Progress Notes (Signed)
Patient pre screened for office appointment, no questions or concerns today. 

## 2018-11-29 ENCOUNTER — Other Ambulatory Visit: Payer: Self-pay

## 2018-11-29 ENCOUNTER — Inpatient Hospital Stay (HOSPITAL_BASED_OUTPATIENT_CLINIC_OR_DEPARTMENT_OTHER): Payer: Medicare Other | Admitting: Oncology

## 2018-11-29 VITALS — BP 147/86 | HR 79 | Temp 97.6°F | Ht 69.0 in | Wt 171.0 lb

## 2018-11-29 DIAGNOSIS — Z8501 Personal history of malignant neoplasm of esophagus: Secondary | ICD-10-CM

## 2018-11-29 DIAGNOSIS — Z08 Encounter for follow-up examination after completed treatment for malignant neoplasm: Secondary | ICD-10-CM

## 2018-11-29 NOTE — Progress Notes (Signed)
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 [id number]  Chief complaint/ Reason for visit-routine surveillance visit for esophageal cancer to discuss results of CT scan  Heme/Onc history: 1. Patient is a 78 year old gentleman with no significant past medical history and takes no medications he has a remote history of smokingduring his college days and quit smoking thereafter. No history of any alcohol intake. He was recently admitted to the hospital on 12/06/2016 with symptoms of progressive dysphagia which he states has been going on since the starting of September. His dysphagia got to the point that he began to have pain even on swallowing icecream. He underwent EGD at that time which showed but high 2 cm long stricture 2 cm above the GE junction. Biopsies at that time were negative for malignancy. Given that he was unable to eat he was started on TPN at that time and plan was to get a repeat EGD with possible biopsy  2. Patient underwent repeat EGD on 12/26/2016 where he was again noted to have a lower esophageal stricture which reduced the lumen to less than 3-4 mm. There was no evidence of Barrett's esophagus. 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 Shortly after the  stricture was dilated the stricture did close up significantly. Postprocedure patient complained of abdominal pain and there was a concern for perforation and hence a repeat CT abdomen was obtained which did not show any evidence of perforation  3. CT abdomen pelvis as well as CT chest with contrast on 10/01/2018showed: 7 mm sub pleural nodule in the lingula. No evidence of axillary mediastinal or hilar adenopathy.1.3 cm soft tissue nodule adjacent to the GE junctionwhich is favored to represent an enlarged lymph node potentially active or metastatic in etiology. Narrowing involving thickening of the distal esophagus compatible with known distal esophageal stricture  4. He lives with his wife and is independent of his ADLs and IADLs. Besides dysphagia patient reports no loss of appetite or unintentional weight loss over the last few months. He denies any pain  5.[place] been deemed to be a potential surgical candidate in the future. Plan for now is to proceed with concurrent chemoradiation followed by EUS upon completion of treatment given difficulty with the  second EGD  6. Chemo/RT started on 11/9/18and completed on 03/01/17  7. Patient underwenttranshiatal total esophagectomy with cervical esophagogastrostomy, pyloromyotomyin April 2019. Pathology showed no residual carcinoma at primary site. 1/5 LN positive for malignancy. 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  He is able to eat most foods without any swallowing difficulty.  Appetite is good and weight is stable.  Denies any new aches or pains anywhere.  ECOG PS- 1 Pain scale- 0   Review of systems- Review of Systems  Constitutional: Negative for chills, fever, malaise/fatigue  and weight loss.  HENT: Negative for congestion, ear discharge and nosebleeds.   Eyes: Negative for blurred vision.    Respiratory: Negative for cough, hemoptysis, sputum production, shortness of breath and wheezing.   Cardiovascular: Negative for chest pain, palpitations, orthopnea and claudication.  Gastrointestinal: Negative for abdominal pain, blood in stool, constipation, diarrhea, heartburn, melena, nausea and vomiting.  Genitourinary: Negative for dysuria, flank pain, frequency, hematuria and urgency.  Musculoskeletal: Negative for back pain, joint pain and myalgias.  Skin: Negative for rash.  Neurological: Negative for dizziness, tingling, focal weakness, seizures, weakness and headaches.  Endo/Heme/Allergies: Does not bruise/bleed easily.  Psychiatric/Behavioral: Negative for depression and suicidal ideas. The patient does not have insomnia.       No Known Allergies   Past Medical History:  Diagnosis Date   Dysphagia    Esophageal cancer ([place]) 2018   Chemo + Rad tx's with surgical resection.      Past Surgical History:  Procedure Laterality Date   CHOLECYSTECTOMY     COMPLETE ESOPHAGECTOMY N/A 06/18/2017   Procedure: cervical ESOPHAGECTOMY COMPLETE;  Surgeon: Grace Isaac, MD;  Location: [facility] OR;  Service: Thoracic;  Laterality: N/A;  NEED NIMS ET TUBE   ESOPHAGOGASTRODUODENOSCOPY (EGD) WITH PROPOFOL N/A 12/07/2016   Procedure: ESOPHAGOGASTRODUODENOSCOPY (EGD) WITH PROPOFOL;  Surgeon: Jonathon Bellows, MD;  Location: [facility] ENDOSCOPY;  Service: Gastroenterology;  Laterality: N/A;   ESOPHAGOGASTRODUODENOSCOPY (EGD) WITH PROPOFOL N/A 12/26/2016   Procedure: ESOPHAGOGASTRODUODENOSCOPY (EGD) WITH PROPOFOL WITH DILATION;  Surgeon: Jonathon Bellows, MD;  Location: [facility] ENDOSCOPY;  Service: Gastroenterology;  Laterality: N/A;   EYE SURGERY     GASTROJEJUNOSTOMY N/A 01/16/2017   Procedure: LAPROSCOPIC ASSIST FEEDING JEJUNOSTOMY TUBE;  Surgeon: Johnathan Hausen, MD;  Location: [facility] ORS;  Service: General;  Laterality: N/A;   IR FLUORO GUIDE PORT INSERTION RIGHT  01/10/2017   IR REMOVAL TUN ACCESS W/  PORT W/O FL MOD SED  03/22/2018   IR REPLACE G-TUBE SIMPLE WO FLUORO  03/15/2017   IR Lyman DUODEN/JEJUNO TUBE PERCUT W/FLUORO  03/05/2017   PICC LINE INSERTION Right    PYLOROMYOTOMY N/A 06/18/2017   Procedure: Edwena Blow;  Surgeon: Grace Isaac, MD;  Location: [place];  Service: Thoracic;  Laterality: N/A;   VIDEO BRONCHOSCOPY N/A 06/18/2017   Procedure: VIDEO BRONCHOSCOPY;  Surgeon: Grace Isaac, MD;  Location: [place];  Service: Thoracic;  Laterality: N/A;    Social History   Socioeconomic History   Marital status: Married    Spouse name: Not on file   Number of children: Not on file   Years of education: Not on file   Highest education level: Not on file  Occupational History   Not on file  Social Needs   Financial resource strain: Not on file   Food insecurity    Worry: Not on file    Inability: Not on file   Transportation needs    Medical: Not on file    Non-medical: Not on file  Tobacco Use   Smoking status: Never Smoker   Smokeless tobacco: Never Used  Substance and Sexual Activity   Alcohol use: No   Drug use: No   Sexual activity: Not Currently  Lifestyle   Physical activity    Days per week: Not on file    Minutes per session: Not on file   Stress: Not on file  Relationships   Social connections    Talks on phone: Not on file    Gets together: Not on file    Attends religious  service: Not on file    Active member of club or organization: Not on file    Attends meetings of clubs or organizations: Not on file    Relationship status: Not on file   Intimate partner violence    Fear of current or ex partner: Not on file    Emotionally abused: Not on file    Physically abused: Not on file    Forced sexual activity: Not on file  Other Topics Concern   Not on file  Social History Narrative   Independent at baseline. Ambulatory    Family History  Problem Relation Age of Onset   Heart disease Father    Diabetes Mother     Hypertension Mother    Heart attack Brother 68   Prostate cancer Neg Hx    Bladder Cancer Neg Hx    Kidney cancer Neg Hx     No current outpatient medications on file. No current facility-administered medications for this visit.   Facility-Administered Medications Ordered in Other Visits:    0.9 %  sodium chloride infusion, , Intravenous, Once, Sindy Guadeloupe, MD   ondansetron (ZOFRAN) 8 mg, dexamethasone (DECADRON) 10 mg in sodium chloride 0.9 % 50 mL IVPB, , Intravenous, Once, Sindy Guadeloupe, MD  Physical exam:  Vitals:   11/29/18 1008  BP: (!) 147/86  Pulse: 79  Temp: 97.6 F (36.4 C)  TempSrc: Tympanic  Weight: 171 lb (77.6 kg)  Height: 5\' 9"  (1.753 m)   Physical Exam Constitutional:      General: He is not in acute distress. HENT:     Head: Normocephalic and atraumatic.  Eyes:     Pupils: Pupils are equal, round, and reactive to light.  Neck:     Musculoskeletal: Normal range of motion.  Cardiovascular:     Rate and Rhythm: Normal rate and regular rhythm.     Heart sounds: Normal heart sounds.  Pulmonary:     Effort: Pulmonary effort is normal.     Breath sounds: Normal breath sounds.  Abdominal:     General: Bowel sounds are normal.     Palpations: Abdomen is soft.  Skin:    General: Skin is warm and dry.  Neurological:     Mental Status: He is alert and oriented to person, place, and time.      CMP Latest Ref Rng & Units 11/28/2018  Glucose 70 - 99 mg/dL 96  BUN 8 - 23 mg/dL 16  Creatinine 0.61 - 1.24 mg/dL 0.94  Sodium 135 - 145 mmol/L 140  Potassium 3.5 - 5.1 mmol/L 4.2  Chloride 98 - 111 mmol/L 107  CO2 22 - 32 mmol/L 24  Calcium 8.9 - 10.3 mg/dL 9.0  Total Protein 6.5 - 8.1 g/dL 7.6  Total Bilirubin 0.3 - 1.2 mg/dL 1.0  Alkaline Phos 38 - 126 U/L 84  AST 15 - 41 U/L 14(L)  ALT 0 - 44 U/L 18   CBC Latest Ref Rng & Units 11/28/2018  WBC 4.0 - 10.5 K/uL 7.3  Hemoglobin 13.0 - 17.0 g/dL 14.8  Hematocrit 39.0 - 52.0 % 44.8  Platelets 150  - 400 K/uL 231    No images are attached to the encounter.  Ct Chest W Contrast  Result Date: 11/28/2018 CLINICAL DATA:  Restaging esophageal cancer, status post neoadjuvant chemo radiation and esophagectomy 06/2017 EXAM: CT CHEST, ABDOMEN, AND PELVIS WITH CONTRAST TECHNIQUE: Multidetector CT imaging of the chest, abdomen and pelvis was performed following the standard protocol during bolus  administration of intravenous contrast. CONTRAST:  5mL OMNIPAQUE IOHEXOL 300 MG/ML SOLN, additional oral enteric contrast COMPARISON:  05/31/2018, 12/21/2017 FINDINGS: CT CHEST FINDINGS Cardiovascular: Aortic valve calcifications. Aortic atherosclerosis. Normal heart size. Extensive 3 vessel coronary artery calcifications. No pericardial effusion. Mediastinum/Nodes: No enlarged mediastinal, hilar, or axillary lymph nodes. Calcified right hilar and mediastinal lymph nodes. Thyroid gland and trachea demonstrate no significant findings. Status post gastric pull-through esophagectomy. Unchanged appearance of the proximal anastomosis without evidence of recurrent mass or soft tissue. Lungs/Pleura: No significant change in small clustered centrilobular and ground-glass nodules of the lingula (series 3, image 102) and lateral segment right middle lobe (series 3, image 107). Calcified, benign pulmonary nodule of the medial right upper lobe. No pleural effusion or pneumothorax. Musculoskeletal: No chest wall mass or suspicious bone lesions identified. Unchanged high-grade wedge deformity of T11. CT ABDOMEN PELVIS FINDINGS Hepatobiliary: No focal liver abnormality is seen. Status post cholecystectomy. No biliary dilatation. Pancreas: Unremarkable. No pancreatic ductal dilatation or surrounding inflammatory changes. Spleen: Normal in size without significant abnormality. Adrenals/Urinary Tract: Adrenal glands are unremarkable. Multiple tiny nonobstructive bilateral renal calculi. Kidneys are otherwise normal, without renal calculi,  solid lesion, or hydronephrosis. Bladder is unremarkable. Stomach/Bowel: Status post gastric pull-through esophagectomy. Appendix appears normal. No evidence of bowel wall thickening, distention, or inflammatory changes. Vascular/Lymphatic: Aortic atherosclerosis. No enlarged abdominal or pelvic lymph nodes. Reproductive: Prostatomegaly. Other: No abdominal wall hernia or abnormality. No abdominopelvic ascites. Musculoskeletal: No acute or significant osseous findings. IMPRESSION: 1. Unchanged postoperative findings of gastric pull-through esophagectomy. No evidence of locally recurrent or metastatic disease in the chest, abdomen, or pelvis. 2. No significant change in small clustered centrilobular and ground-glass nodules of the lingula (series 3, image 102) and lateral segment right middle lobe (series 3, image 107). These remain nonspecific, infectious or inflammatory, and likely related to atypical infection. 3. Other chronic, incidental, and postoperative findings as detailed above. 4.  Aortic Atherosclerosis (ICD10-I70.0). Electronically Signed   By: Eddie Candle M.D.   On: 11/28/2018 14:26   Ct Abdomen Pelvis W Contrast  Result Date: 11/28/2018 CLINICAL DATA:  Restaging esophageal cancer, status post neoadjuvant chemo radiation and esophagectomy 06/2017 EXAM: CT CHEST, ABDOMEN, AND PELVIS WITH CONTRAST TECHNIQUE: Multidetector CT imaging of the chest, abdomen and pelvis was performed following the standard protocol during bolus administration of intravenous contrast. CONTRAST:  13mL OMNIPAQUE IOHEXOL 300 MG/ML SOLN, additional oral enteric contrast COMPARISON:  05/31/2018, 12/21/2017 FINDINGS: CT CHEST FINDINGS Cardiovascular: Aortic valve calcifications. Aortic atherosclerosis. Normal heart size. Extensive 3 vessel coronary artery calcifications. No pericardial effusion. Mediastinum/Nodes: No enlarged mediastinal, hilar, or axillary lymph nodes. Calcified right hilar and mediastinal lymph nodes. Thyroid  gland and trachea demonstrate no significant findings. Status post gastric pull-through esophagectomy. Unchanged appearance of the proximal anastomosis without evidence of recurrent mass or soft tissue. Lungs/Pleura: No significant change in small clustered centrilobular and ground-glass nodules of the lingula (series 3, image 102) and lateral segment right middle lobe (series 3, image 107). Calcified, benign pulmonary nodule of the medial right upper lobe. No pleural effusion or pneumothorax. Musculoskeletal: No chest wall mass or suspicious bone lesions identified. Unchanged high-grade wedge deformity of T11. CT ABDOMEN PELVIS FINDINGS Hepatobiliary: No focal liver abnormality is seen. Status post cholecystectomy. No biliary dilatation. Pancreas: Unremarkable. No pancreatic ductal dilatation or surrounding inflammatory changes. Spleen: Normal in size without significant abnormality. Adrenals/Urinary Tract: Adrenal glands are unremarkable. Multiple tiny nonobstructive bilateral renal calculi. Kidneys are otherwise normal, without renal calculi, solid lesion, or hydronephrosis. Bladder is  unremarkable. Stomach/Bowel: Status post gastric pull-through esophagectomy. Appendix appears normal. No evidence of bowel wall thickening, distention, or inflammatory changes. Vascular/Lymphatic: Aortic atherosclerosis. No enlarged abdominal or pelvic lymph nodes. Reproductive: Prostatomegaly. Other: No abdominal wall hernia or abnormality. No abdominopelvic ascites. Musculoskeletal: No acute or significant osseous findings. IMPRESSION: 1. Unchanged postoperative findings of gastric pull-through esophagectomy. No evidence of locally recurrent or metastatic disease in the chest, abdomen, or pelvis. 2. No significant change in small clustered centrilobular and ground-glass nodules of the lingula (series 3, image 102) and lateral segment right middle lobe (series 3, image 107). These remain nonspecific, infectious or inflammatory,  and likely related to atypical infection. 3. Other chronic, incidental, and postoperative findings as detailed above. 4.  Aortic Atherosclerosis (ICD10-I70.0). Electronically Signed   By: Eddie Candle M.D.   On: 11/28/2018 14:26     Assessment and plan- Patient is a 78 y.o. 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 YpT0ypN1.   He is here to discuss results of her CT scans and surveillance visit for esophageal cancer  CT chest abdomen pelvis with contrast was reviewed independently and discussed findings with the patient.  There are no concerning signs of recurrence or metastatic disease at this time.  It has been more than 2 years since his diagnosis and he does not require surveillance imaging at this time unless there are any concerning signs and symptoms.  Endoscopy would be as clinically indicated.  I will see him back in 1 years time no labs.   Visit Diagnosis 1. Encounter for follow-up surveillance of esophageal cancer      Dr. Randa Evens, MD, MPH [facility] at [facility] [id number] 11/29/2018 3:19 PM

## 2019-02-27 ENCOUNTER — Ambulatory Visit: Payer: Medicare Other | Admitting: Cardiothoracic Surgery

## 2019-04-10 ENCOUNTER — Ambulatory Visit: Payer: Medicare Other

## 2019-04-11 ENCOUNTER — Ambulatory Visit: Payer: Medicare Other

## 2019-04-18 ENCOUNTER — Ambulatory Visit: Payer: Medicare Other | Attending: Internal Medicine

## 2019-04-18 ENCOUNTER — Ambulatory Visit: Payer: Medicare Other

## 2019-04-18 DIAGNOSIS — Z23 Encounter for immunization: Secondary | ICD-10-CM | POA: Insufficient documentation

## 2019-04-18 NOTE — Progress Notes (Signed)
   Covid-19 Vaccination Clinic  Name:  Casey Acevedo    MRN: [id number] DOB: 04/05/1940  04/18/2019  Casey Acevedo was observed post Covid-19 immunization for 15 minutes without incidence. He was provided with Vaccine Information Sheet and instruction to access the V-Safe system.   Casey Acevedo was instructed to call 911 with any severe reactions post vaccine: Marland Kitchen Difficulty breathing  . Swelling of your face and throat  . A fast heartbeat  . A bad rash all over your body  . Dizziness and weakness    Immunizations Administered    Name Date Dose VIS Date Route   Pfizer COVID-19 Vaccine 04/18/2019  8:58 AM 0.3 mL 02/21/2019 Intramuscular   Manufacturer: [place]   Lot: [id number]   [place]: [id number]

## 2019-05-01 ENCOUNTER — Ambulatory Visit: Payer: Medicare Other

## 2019-05-02 ENCOUNTER — Ambulatory Visit: Payer: Medicare Other

## 2019-05-13 ENCOUNTER — Ambulatory Visit: Payer: Medicare Other | Attending: Internal Medicine

## 2019-05-13 DIAGNOSIS — Z23 Encounter for immunization: Secondary | ICD-10-CM | POA: Insufficient documentation

## 2019-05-13 NOTE — Progress Notes (Signed)
   Covid-19 Vaccination Clinic  Name:  Casey Acevedo    MRN: [id number] DOB: 10-24-1940  05/13/2019  Mr. Bongo was observed post Covid-19 immunization for 15 minutes without incident. He was provided with Vaccine Information Sheet and instruction to access the V-Safe system.   Mr. Sizemore was instructed to call 911 with any severe reactions post vaccine: Marland Kitchen Difficulty breathing  . Swelling of face and throat  . A fast heartbeat  . A bad rash all over body  . Dizziness and weakness   Immunizations Administered    Name Date Dose VIS Date Route   Pfizer COVID-19 Vaccine 05/13/2019  8:47 AM 0.3 mL 02/21/2019 Intramuscular   Manufacturer: [place]   Lot: [id number]   [place]: [id number]

## 2019-11-28 ENCOUNTER — Other Ambulatory Visit: Payer: Self-pay | Admitting: *Deleted

## 2019-11-28 DIAGNOSIS — Z8501 Personal history of malignant neoplasm of esophagus: Secondary | ICD-10-CM

## 2019-12-01 ENCOUNTER — Other Ambulatory Visit: Payer: Self-pay

## 2019-12-01 ENCOUNTER — Inpatient Hospital Stay (HOSPITAL_BASED_OUTPATIENT_CLINIC_OR_DEPARTMENT_OTHER): Payer: Medicare Other | Admitting: Oncology

## 2019-12-01 ENCOUNTER — Encounter: Payer: Self-pay | Admitting: Oncology

## 2019-12-01 ENCOUNTER — Inpatient Hospital Stay: Payer: Medicare Other | Attending: Oncology

## 2019-12-01 VITALS — BP 138/82 | HR 70 | Temp 97.9°F | Resp 16 | Ht 69.0 in | Wt 172.0 lb

## 2019-12-01 DIAGNOSIS — Z9221 Personal history of antineoplastic chemotherapy: Secondary | ICD-10-CM | POA: Diagnosis not present

## 2019-12-01 DIAGNOSIS — Z08 Encounter for follow-up examination after completed treatment for malignant neoplasm: Secondary | ICD-10-CM

## 2019-12-01 DIAGNOSIS — Z8501 Personal history of malignant neoplasm of esophagus: Secondary | ICD-10-CM

## 2019-12-01 DIAGNOSIS — Z923 Personal history of irradiation: Secondary | ICD-10-CM | POA: Diagnosis not present

## 2019-12-01 DIAGNOSIS — Z87891 Personal history of nicotine dependence: Secondary | ICD-10-CM | POA: Insufficient documentation

## 2019-12-01 DIAGNOSIS — Z8249 Family history of ischemic heart disease and other diseases of the circulatory system: Secondary | ICD-10-CM | POA: Diagnosis not present

## 2019-12-01 LAB — CBC WITH DIFFERENTIAL/PLATELET
Abs Immature Granulocytes: 0.02 10*3/uL (ref 0.00–0.07)
Basophils Absolute: 0.2 10*3/uL — ABNORMAL HIGH (ref 0.0–0.1)
Basophils Relative: 2 %
Eosinophils Absolute: 0.5 10*3/uL (ref 0.0–0.5)
Eosinophils Relative: 7 %
HCT: 45.7 % (ref 39.0–52.0)
Hemoglobin: 15.7 g/dL (ref 13.0–17.0)
Immature Granulocytes: 0 %
Lymphocytes Relative: 32 %
Lymphs Abs: 2.2 10*3/uL (ref 0.7–4.0)
MCH: 30.9 pg (ref 26.0–34.0)
MCHC: 34.4 g/dL (ref 30.0–36.0)
MCV: 90 fL (ref 80.0–100.0)
Monocytes Absolute: 0.6 10*3/uL (ref 0.1–1.0)
Monocytes Relative: 9 %
Neutro Abs: 3.3 10*3/uL (ref 1.7–7.7)
Neutrophils Relative %: 50 %
Platelets: 222 10*3/uL (ref 150–400)
RBC: 5.08 MIL/uL (ref 4.22–5.81)
RDW: 13.5 % (ref 11.5–15.5)
WBC: 6.7 10*3/uL (ref 4.0–10.5)
nRBC: 0 % (ref 0.0–0.2)

## 2019-12-01 LAB — COMPREHENSIVE METABOLIC PANEL
ALT: 17 U/L (ref 0–44)
AST: 14 U/L — ABNORMAL LOW (ref 15–41)
Albumin: 4.1 g/dL (ref 3.5–5.0)
Alkaline Phosphatase: 80 U/L (ref 38–126)
Anion gap: 8 (ref 5–15)
BUN: 17 mg/dL (ref 8–23)
CO2: 26 mmol/L (ref 22–32)
Calcium: 8.7 mg/dL — ABNORMAL LOW (ref 8.9–10.3)
Chloride: 107 mmol/L (ref 98–111)
Creatinine, Ser: 1.32 mg/dL — ABNORMAL HIGH (ref 0.61–1.24)
GFR calc Af Amer: 59 mL/min — ABNORMAL LOW (ref >60)
GFR calc non Af Amer: 51 mL/min — ABNORMAL LOW (ref >60)
Glucose, Bld: 107 mg/dL — ABNORMAL HIGH (ref 70–99)
Potassium: 4.2 mmol/L (ref 3.5–5.1)
Sodium: 141 mmol/L (ref 135–145)
Total Bilirubin: 0.9 mg/dL (ref 0.3–1.2)
Total Protein: 7.2 g/dL (ref 6.5–8.1)

## 2019-12-01 NOTE — Progress Notes (Signed)
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 ypT0ypN1  Chief complaint/ Reason for visit-routine follow-up of esophageal cancer  Heme/Onc history: 1. Patient is a 79 year old gentleman with no significant past medical history and takes no medications he has a remote history of smokingduring his college days and quit smoking thereafter. No history of any alcohol intake. He was recently admitted to the hospital on 12/06/2016 with symptoms of progressive dysphagia which he states has been going on since the starting of September. His dysphagia got to the point that he began to have pain even on swallowing icecream. He underwent EGD at that time which showed but high 2 cm long stricture 2 cm above the GE junction. Biopsies at that time were negative for malignancy. Given that he was unable to eat he was started on TPN at that time and plan was to get a repeat EGD with possible biopsy  2. Patient underwent repeat EGD on 12/26/2016 where he was again noted to have a lower esophageal stricture which reduced the lumen to less than 3-4 mm. There was no evidence of Barrett's esophagus. 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 Shortly after the stricture was dilated the stricture did  close up significantly. Postprocedure patient complained of abdominal pain and there was a concern for perforation and hence a repeat CT abdomen was obtained which did not show any evidence of perforation  3. CT abdomen pelvis as well as CT chest with contrast on 10/01/2018showed: 7 mm sub pleural nodule in the lingula. No evidence of axillary mediastinal or hilar adenopathy.1.3 cm soft tissue nodule adjacent to the GE junctionwhich is favored to represent an enlarged lymph node potentially active or metastatic in etiology. Narrowing involving thickening of the distal esophagus compatible with known distal esophageal stricture  4. He lives with his wife and is independent of his ADLs and IADLs. Besides dysphagia patient reports no loss of appetite or unintentional weight loss over the last few months. He denies any pain  5.[place] has been deemed to be a potential surgical candidate in the future. Plan for now is to proceed with concurrent chemoradiation followed by EUS upon completion of treatment given difficulty with the  second EGD  6. Chemo/RT started on 11/9/18and completed on 03/01/17  7. Patient underwenttranshiatal total esophagectomy with cervical esophagogastrostomy, pyloromyotomyin April 2019. Pathology showed no residual carcinoma at primary site. 1/5 LN positive for malignancy. 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  Patient currently remains in remission   Interval history-patient reports doing well and denies any complaints at this time.  No difficulty swallowing.  Appetite and weight have remained stable.  Denies any new aches and pains anywhere  ECOG PS- 1 Pain scale- 0 Opioid associated constipation- no  Review of systems- Review of Systems  Constitutional: Negative for chills, fever, malaise/fatigue and weight loss.  HENT: Negative for congestion, ear discharge and nosebleeds.   Eyes: Negative  for blurred vision.  Respiratory: Negative for cough, hemoptysis, sputum production, shortness of breath and wheezing.   Cardiovascular: Negative for chest pain, palpitations, orthopnea and claudication.  Gastrointestinal: Negative for abdominal pain, blood in stool, constipation, diarrhea, heartburn, melena, nausea and vomiting.  Genitourinary: Negative for dysuria, flank pain, frequency, hematuria and urgency.  Musculoskeletal: Negative for back pain, joint pain and myalgias.  Skin: Negative for rash.  Neurological: Negative for dizziness, tingling, focal weakness, seizures, weakness and headaches.  Endo/Heme/Allergies: Does not bruise/bleed easily.  Psychiatric/Behavioral: Negative for depression and suicidal ideas. The patient does not have insomnia.       No Known Allergies   Past Medical History:  Diagnosis Date  . Dysphagia   . Esophageal cancer ([place]) 2018   Chemo + Rad tx's with surgical resection.      Past Surgical History:  Procedure Laterality Date  . CHOLECYSTECTOMY    . COMPLETE ESOPHAGECTOMY N/A 06/18/2017   Procedure: cervical ESOPHAGECTOMY COMPLETE;  Surgeon: Grace Isaac, MD;  Location: [facility] OR;  Service: Thoracic;  Laterality: N/A;  NEED NIMS ET TUBE  . ESOPHAGOGASTRODUODENOSCOPY (EGD) WITH PROPOFOL N/A 12/07/2016   Procedure: ESOPHAGOGASTRODUODENOSCOPY (EGD) WITH PROPOFOL;  Surgeon: Jonathon Bellows, MD;  Location: [facility] ENDOSCOPY;  Service: Gastroenterology;  Laterality: N/A;  . ESOPHAGOGASTRODUODENOSCOPY (EGD) WITH PROPOFOL N/A 12/26/2016   Procedure: ESOPHAGOGASTRODUODENOSCOPY (EGD) WITH PROPOFOL WITH DILATION;  Surgeon: Jonathon Bellows, MD;  Location: [facility] ENDOSCOPY;  Service: Gastroenterology;  Laterality: N/A;  . EYE SURGERY    . GASTROJEJUNOSTOMY N/A 01/16/2017   Procedure: LAPROSCOPIC ASSIST FEEDING JEJUNOSTOMY TUBE;  Surgeon: Johnathan Hausen, MD;  Location: [facility] ORS;  Service: General;  Laterality: N/A;  . IR FLUORO GUIDE PORT INSERTION RIGHT  01/10/2017  . IR  REMOVAL TUN ACCESS W/ PORT W/O FL MOD SED  03/22/2018  . IR REPLACE G-TUBE SIMPLE WO FLUORO  03/15/2017  . IR REPLC DUODEN/JEJUNO TUBE PERCUT W/FLUORO  03/05/2017  . PICC LINE INSERTION Right   . PYLOROMYOTOMY N/A 06/18/2017   Procedure: Edwena Blow;  Surgeon: Grace Isaac, MD;  Location: [place];  Service: Thoracic;  Laterality: N/A;  . VIDEO BRONCHOSCOPY N/A 06/18/2017   Procedure: VIDEO BRONCHOSCOPY;  Surgeon: Grace Isaac, MD;  Location: [facility] OR;  Service: Thoracic;  Laterality: N/A;    Social History   Socioeconomic History  . Marital status: Married    Spouse name: Not on file  . Number of children: Not on file  . Years of education: Not on file  . Highest education level: Not on file  Occupational History  . Not on file  Tobacco Use  . Smoking status: Never Smoker  . Smokeless tobacco: Never Used  Vaping Use  . Vaping Use: Never used  Substance and Sexual Activity  . Alcohol use: No  . Drug use: No  . Sexual activity: Not Currently  Other Topics Concern  . Not on file  Social History Narrative   Independent at baseline. Ambulatory   Social Determinants of Health   Financial Resource Strain:   . Difficulty of Paying Living Expenses: Not on file  Food Insecurity:   . Worried About Charity fundraiser in the Last Year: Not on file  . Ran Out of Food in the Last Year: Not on file  Transportation Needs:   . Lack of Transportation (Medical): Not on file  . Lack of Transportation (Non-Medical): Not on file  Physical Activity:   .  Days of Exercise per Week: Not on file  . Minutes of Exercise per Session: Not on file  Stress:   . Feeling of Stress : Not on file  Social Connections:   . Frequency of Communication with Friends and Family: Not on file  . Frequency of Social Gatherings with Friends and Family: Not on file  . Attends Religious Services: Not on file  . Active Member of Clubs or Organizations: Not on file  . Attends Archivist Meetings: Not  on file  . Marital Status: Not on file  Intimate Partner Violence:   . Fear of Current or Ex-Partner: Not on file  . Emotionally Abused: Not on file  . Physically Abused: Not on file  . Sexually Abused: Not on file    Family History  Problem Relation Age of Onset  . Heart disease Father   . Diabetes Mother   . Hypertension Mother   . Heart attack Brother 23  . Prostate cancer Neg Hx   . Bladder Cancer Neg Hx   . Kidney cancer Neg Hx     No current outpatient medications on file. No current facility-administered medications for this visit.  Facility-Administered Medications Ordered in Other Visits:  .  0.9 %  sodium chloride infusion, , Intravenous, Once, Sindy Guadeloupe, MD .  ondansetron (ZOFRAN) 8 mg, dexamethasone (DECADRON) 10 mg in sodium chloride 0.9 % 50 mL IVPB, , Intravenous, Once, Sindy Guadeloupe, MD  Physical exam:  Vitals:   12/01/19 1000  BP: 138/82  Pulse: 70  Resp: 16  Temp: 97.9 F (36.6 C)  TempSrc: Tympanic  Weight: 172 lb (78 kg)  Height: _0  (1.753 m)   Physical Exam Constitutional:      General: He is not in acute distress. Cardiovascular:     Rate and Rhythm: Normal rate and regular rhythm.     Heart sounds: Normal heart sounds.  Pulmonary:     Effort: Pulmonary effort is normal.     Breath sounds: Normal breath sounds.  Abdominal:     General: Bowel sounds are normal.     Palpations: Abdomen is soft.  Skin:    General: Skin is warm and dry.  Neurological:     Mental Status: He is alert and oriented to person, place, and time.      CMP Latest Ref Rng & Units 12/01/2019  Glucose 70 - 99 mg/dL 107(H)  BUN 8 - 23 mg/dL 17  Creatinine 0.61 - 1.24 mg/dL 1.32(H)  Sodium 135 - 145 mmol/L 141  Potassium 3.5 - 5.1 mmol/L 4.2  Chloride 98 - 111 mmol/L 107  CO2 22 - 32 mmol/L 26  Calcium 8.9 - 10.3 mg/dL 8.7(L)  Total Protein 6.5 - 8.1 g/dL 7.2  Total Bilirubin 0.3 - 1.2 mg/dL 0.9  Alkaline Phos 38 - 126 U/L 80  AST 15 - 41 U/L 14(L)    ALT 0 - 44 U/L 17   CBC Latest Ref Rng & Units 12/01/2019  WBC 4.0 - 10.5 K/uL 6.7  Hemoglobin 13.0 - 17.0 g/dL 15.7  Hematocrit 39 - 52 % 45.7  Platelets 150 - 400 K/uL 222     Assessment and plan- Patient is a 79 y.o. 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 YpT0ypN1.    This is a routine follow-up visit for esophageal cancer  Patient received trimodality treatment for esophageal cancer back in 2018.  Last scans in September 2020 did not reveal any  evidence of malignancy.  I will obtain 1 last CT scan 36 months from his diagnosis of malignancy-if3This does not show any evidence of metastatic disease or progression he does not require any surveillance imaging unless there is suspicious signs and symptoms.  EGD would also be recommended only for concerning signs and symptoms   I will see him back in 1 year with CBC with differential and CMP  Visit Diagnosis 1. Encounter for follow-up surveillance of esophageal cancer      Dr. Randa Evens, MD, MPH [facility] [phone] 12/01/2019 12:45 PM

## 2019-12-01 NOTE — Progress Notes (Signed)
Pt doing well, no meds. He does say around 2 pm he gets fatigued but then it goes away. He walks a mile a day, eats and drinks good, BM is good. He does say that he has to stop eating and drinking 1 hour before he goes to bed or else he feels the fluid or food coming up to his throat.

## 2019-12-02 LAB — CEA: CEA: 2.9 ng/mL (ref 0.0–4.7)

## 2019-12-04 ENCOUNTER — Ambulatory Visit: Payer: Medicare Other | Admitting: Cardiothoracic Surgery

## 2019-12-10 ENCOUNTER — Other Ambulatory Visit: Payer: Self-pay

## 2019-12-10 ENCOUNTER — Ambulatory Visit
Admission: RE | Admit: 2019-12-10 | Discharge: 2019-12-10 | Disposition: A | Discharge status: Home / Self Care | Payer: Medicare Other | Source: Ambulatory Visit | Attending: Oncology | Admitting: Oncology

## 2019-12-10 DIAGNOSIS — Z8501 Personal history of malignant neoplasm of esophagus: Secondary | ICD-10-CM | POA: Insufficient documentation

## 2019-12-10 DIAGNOSIS — Z08 Encounter for follow-up examination after completed treatment for malignant neoplasm: Secondary | ICD-10-CM | POA: Insufficient documentation

## 2019-12-10 IMAGING — CT CT CHEST-ABD-PELV W/ CM
2 of 5 series · 13 of 36 positions shown, 15 images · IV contrast (omnipaque)
Comparison: [DATE]

CLINICAL DATA: Follow-up esophageal carcinoma. Previous surgery,
chemotherapy, and radiation therapy.

EXAM:
CT CHEST, ABDOMEN, AND PELVIS WITH CONTRAST
TECHNIQUE: Multidetector CT imaging of the chest, abdomen and pelvis was
performed following the standard protocol during bolus
administration of intravenous contrast.
CONTRAST:  100mL OMNIPAQUE IOHEXOL 300 MG/ML  SOLN

[Series 2: axials cap 5.00 · axial · 0.78mm/px · z∈[-1392,-812]mm · 10 of 142 slices shown, 12 images]
[im 13/142  mediastinal]
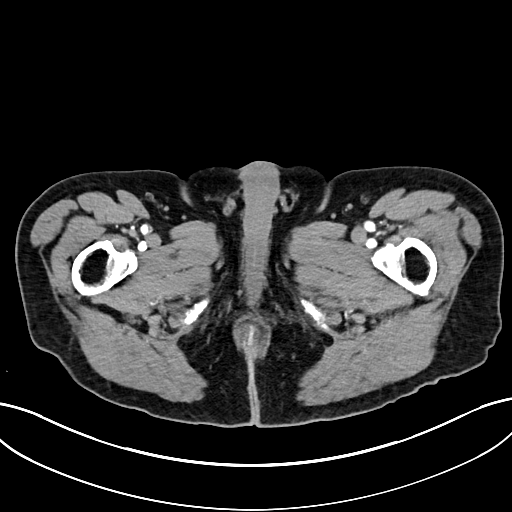
[im 13/142  bone]
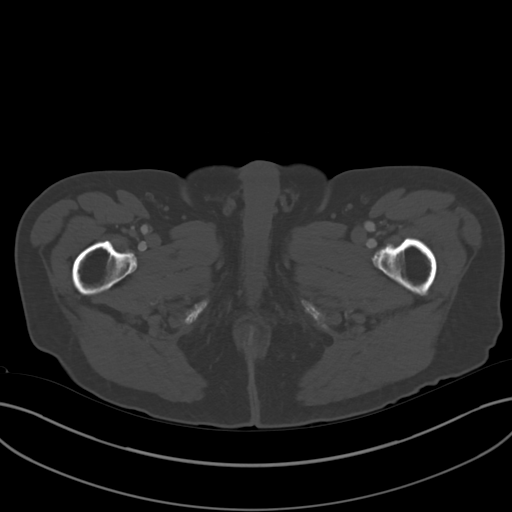
[im 26/142  mediastinal]
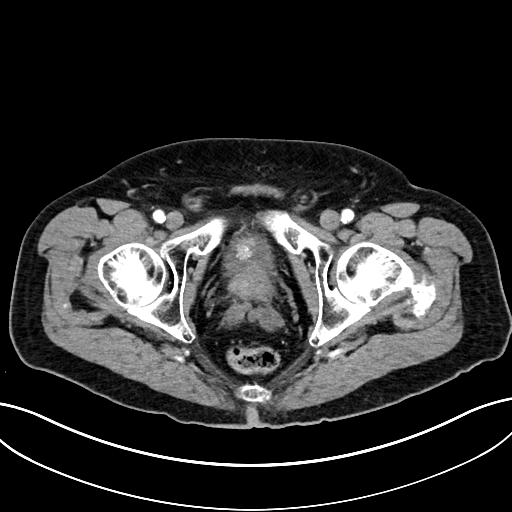
[im 39/142  mediastinal]
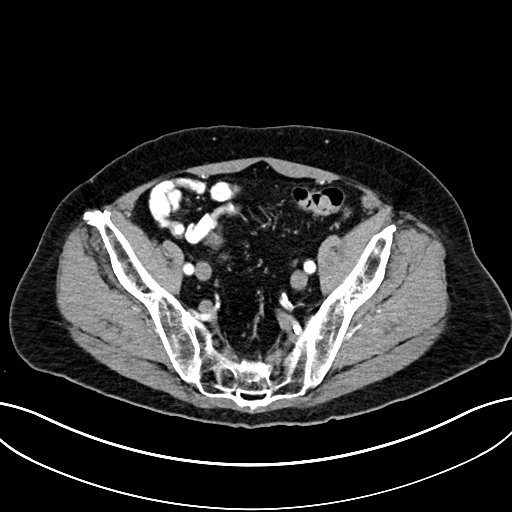
[im 52/142  mediastinal]
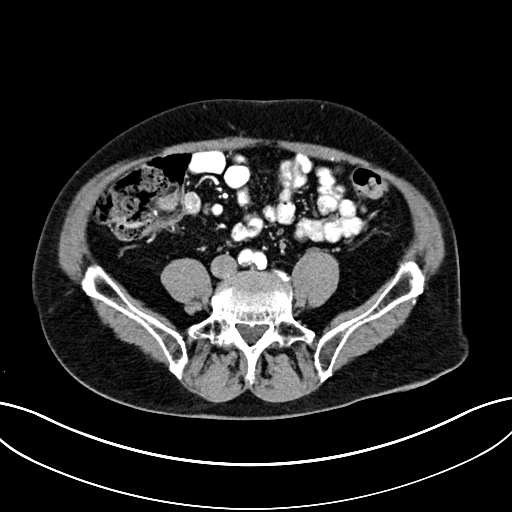
[im 65/142  mediastinal]
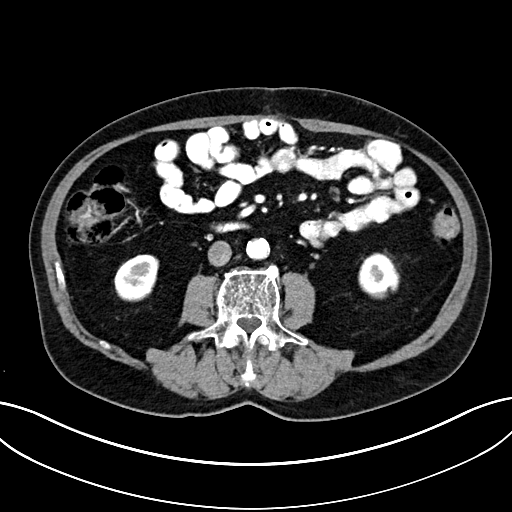
[im 77/142  mediastinal]
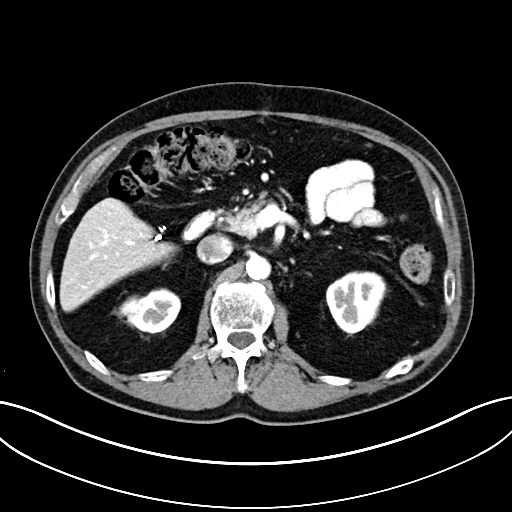
[im 90/142  mediastinal]
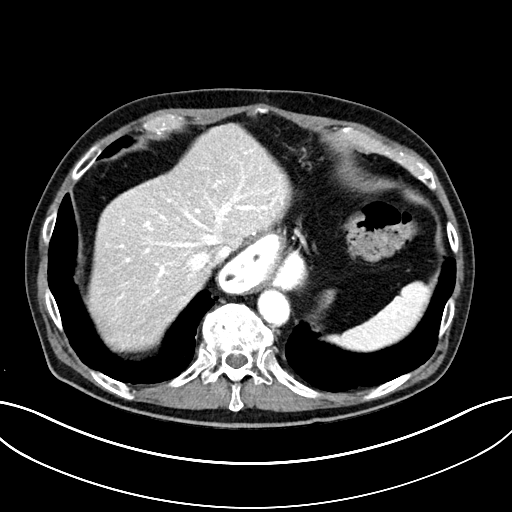
[im 103/142  mediastinal]
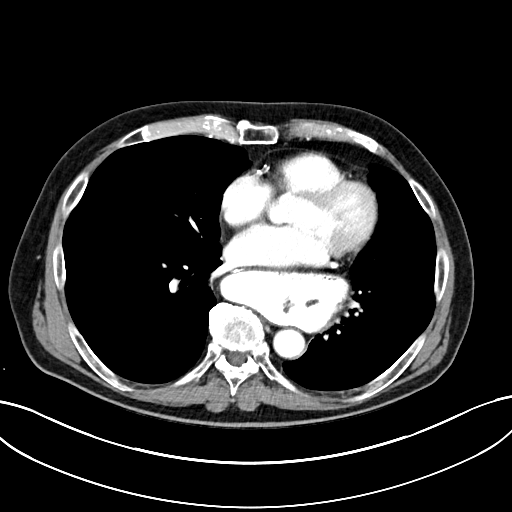
[im 116/142  mediastinal]
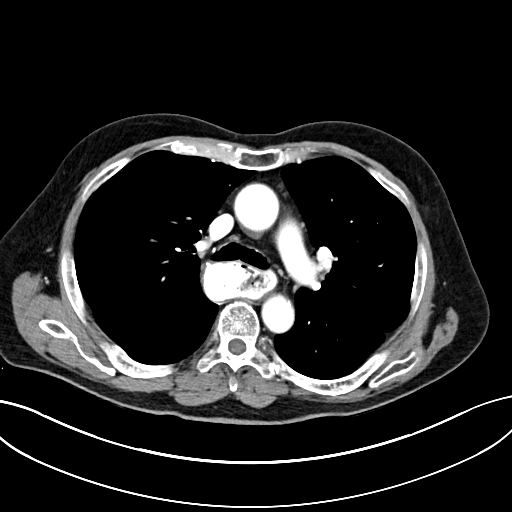
[im 116/142  bone]
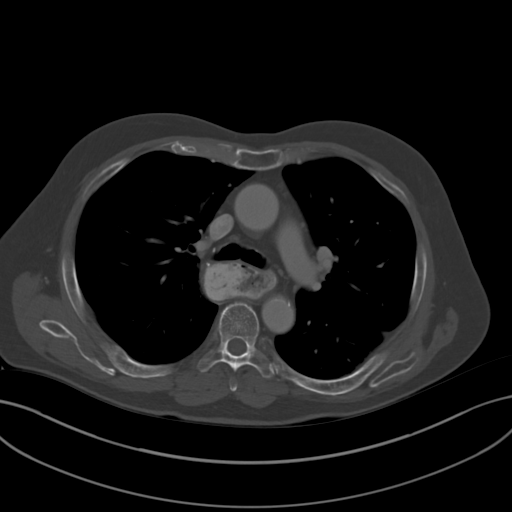
[im 129/142  mediastinal]
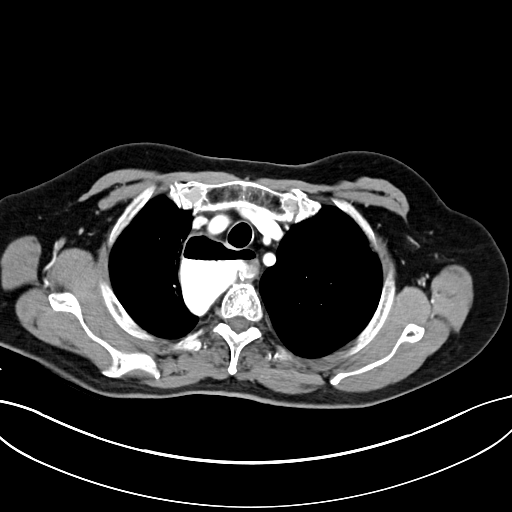

[Series 4: coronals cap 2.00 cor · coronal · 0.78mm/px · 3 of 138 slices shown]
[im 28/138  mediastinal]
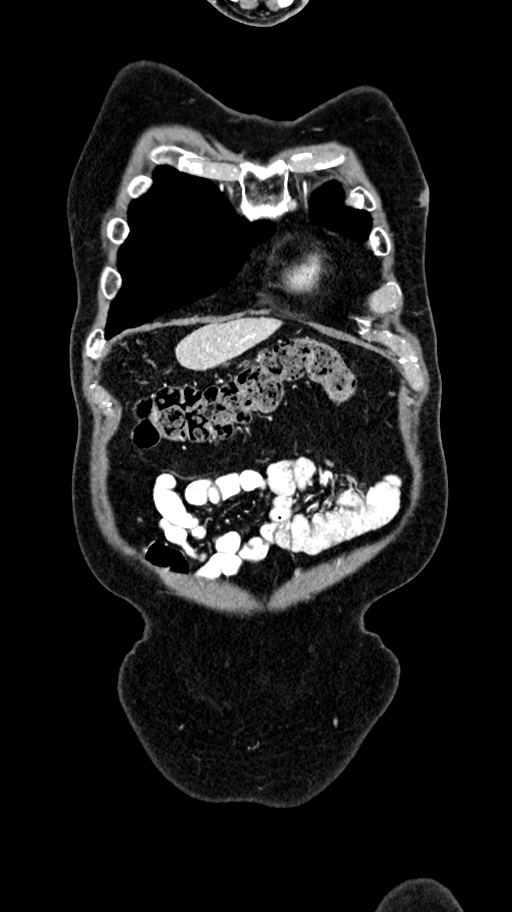
[im 55/138  mediastinal]
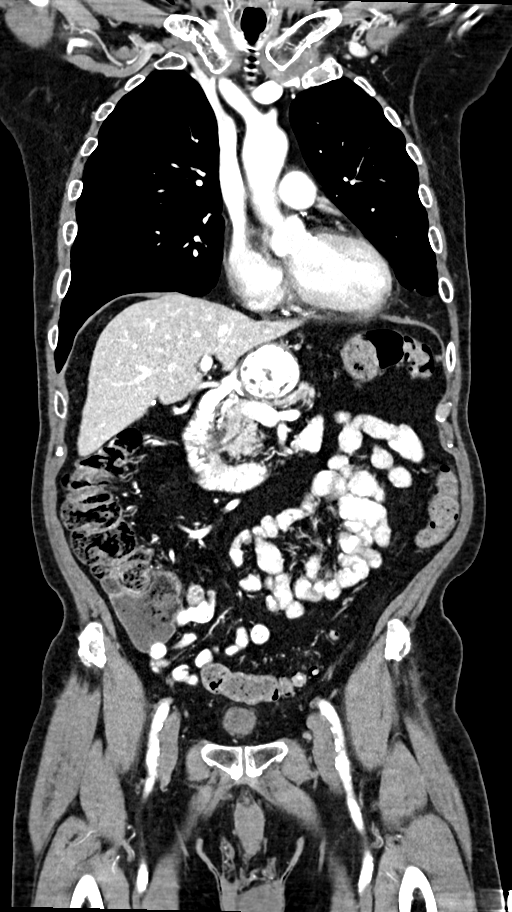
[im 83/138  mediastinal]
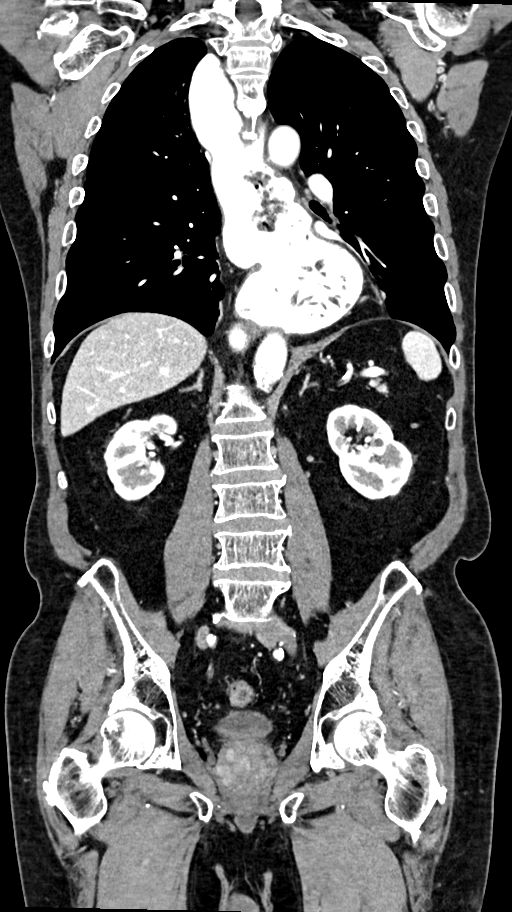

[13 of 36 positions shown; findings below may reference images not displayed]

FINDINGS: CT CHEST FINDINGS

Cardiovascular: No acute findings. Aortic and coronary artery
atherosclerosis noted.

Mediastinum/Lymph Nodes: Stable postop changes from esophagectomy
with gastric pull-through. No masses or pathologically enlarged
lymph nodes identified.

Lungs/Pleura: No pulmonary infiltrate or mass identified. No
effusion present.

Musculoskeletal:  No suspicious bone lesions identified.

CT ABDOMEN AND PELVIS FINDINGS

Hepatobiliary: No masses identified. Stable small cyst in left
hepatic lobe. Prior cholecystectomy. No evidence of biliary
obstruction.

Pancreas:  No mass or inflammatory changes.

Spleen:  Within normal limits in size and appearance.

Adrenals/Urinary tract: Tiny left renal cyst again noted. No renal
masses or hydronephrosis. A 12 mm polypoid soft tissue density with
tiny calcifications is now seen along the floor of the urinary
bladder which is distinct from the prostate and suspicious for small
bladder carcinoma.

Stomach/Bowel: No evidence of obstruction, inflammatory process, or
abnormal fluid collections. Diverticulosis is seen mainly involving
the sigmoid colon, however there is no evidence of diverticulitis.

Vascular/Lymphatic: No pathologically enlarged lymph nodes
identified. No abdominal aortic aneurysm. Aortic atherosclerosis
noted.

Reproductive:  No mass or other significant abnormality identified.

Other:  None.

Musculoskeletal:  No suspicious bone lesions identified.
IMPRESSION: No evidence of recurrent or metastatic esophageal carcinoma.

12 mm polypoid soft tissue density with tiny calcifications along
the floor of the urinary bladder, suspicious for small primary
bladder carcinoma. Cystoscopy is recommended for further evaluation.

Colonic diverticulosis. No radiographic evidence of diverticulitis.

Aortic Atherosclerosis ([VL]-[VL]). Coronary artery
atherosclerosis.

## 2019-12-10 MED ORDER — IOHEXOL 300 MG/ML  SOLN
100.0000 mL | Freq: Once | INTRAMUSCULAR | Status: AC | PRN
  Administered 2019-12-10: 100 mL via INTRAVENOUS

## 2019-12-11 ENCOUNTER — Encounter: Payer: Self-pay | Admitting: Cardiothoracic Surgery

## 2019-12-11 ENCOUNTER — Ambulatory Visit (INDEPENDENT_AMBULATORY_CARE_PROVIDER_SITE_OTHER): Payer: Medicare Other | Admitting: Cardiothoracic Surgery

## 2019-12-11 VITALS — BP 133/85 | HR 88 | Temp 96.8°F | Resp 20 | Ht 69.0 in | Wt 169.6 lb

## 2019-12-11 DIAGNOSIS — Z9889 Other specified postprocedural states: Secondary | ICD-10-CM | POA: Diagnosis not present

## 2019-12-11 DIAGNOSIS — Z9049 Acquired absence of other specified parts of digestive tract: Secondary | ICD-10-CM

## 2019-12-11 NOTE — Progress Notes (Signed)
[address]       [place] [address]             [phone]                  Donye D Walsh [place] Medical Record #[id number] Date of Birth: 12-Sep-1940  Referring MD:Rao, Weston Anna, MD Primary Cardiology: Primary Care:Sparks, Leonie Douglas, MD Medical oncologist: Sindy Guadeloupe, MD Radiation oncologist:Chrystal, Eulas Post, MD  Chief Complaint:  Follow Up Visit 06/18/2017 OPERATIVE REPORT PREOPERATIVE DIAGNOSIS:  Carcinoma of the distal third of the esophagus, SP radiation and chemo POSTOPERATIVE DIAGNOSIS:  Carcinoma of the distal third of the esophagus. SURGICAL PROCEDURES:  Video bronchoscopy, transhiatal total esophagectomy with cervical esophagogastrostomy, pyloromyotomy. SURGEON:  Lanelle Bal, MD.   Cancer Staging Malignant neoplasm of lower third of esophagus [facility]) Staging form: Esophagus - Adenocarcinoma, AJCC 8th Edition - Clinical stage from 01/04/2017: Stage Unknown (cTX, cN0, cM0) - Signed by Sindy Guadeloupe, MD on 01/04/2017 - Pathologic stage from 06/22/2017: Stage Unknown (ypTX, pN1, cM0, GX) - Signed by Grace Isaac, MD on 06/22/2017   History of Present Illness:  Patient returns to the office today in follow-up after transhiatal total esophagectomy now 30 months postop.  Patient is maintaining his diet well.  Denies any problems taking a p.o. diet.  Maintaining his weight adequately.   Patient remains active, notes that he eats 3 regular meals a day.  He still does take care about not laying down for 2 hours after he eats.   Final path: Diagnosis 1. Lymph node, biopsy, omental - FIBROADIPOSE TISSUE - NO MALIGNANCY IDENTIFIED 2. 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 An oncology table was not completed because there was no residual carcinoma identified in esophagus consistent with a complete response (Score 0) to neoadjuvant therapy. Based on the current specimen, this would be staged as ypT0 ypN1. Dr. Saralyn Pilar reviewed the case and agrees with the above diagnosis. DAWN BUTLER MD  Wt Readings from Last 3 Encounters:  12/11/19 169 lb 9.6 oz (76.9 kg)  12/01/19 172 lb (78 kg)  11/29/18 171 lb (77.6 kg)    Zubrod Score: At the time of surgery this patient's most appropriate activity status/level should be described as: [x]     0    Normal activity, no symptoms []     1    Restricted in physical strenuous activity but ambulatory, able to do out light work []     2    Ambulatory and capable of self care, unable to do work activities, up and about                 >50 % of waking hours                                                                                   []     3    Only limited self care, in bed  greater than 50% of waking hours []     4    Completely disabled, no self care, confined to bed or chair []     5    Moribund  Social History   Tobacco Use  Smoking Status Never Smoker  Smokeless Tobacco Never Used       No Known Allergies  No current outpatient medications on file.   No current facility-administered medications for this visit.   Facility-Administered Medications Ordered in Other Visits  Medication Dose Route Frequency Provider Last Rate Last Admin  . 0.9 %  sodium chloride infusion   Intravenous Once Sindy Guadeloupe, MD      . ondansetron (ZOFRAN) 8 mg, dexamethasone (DECADRON) 10 mg in sodium chloride 0.9 % 50 mL IVPB   Intravenous Once Sindy Guadeloupe, MD           Physical Exam: BP 133/85   Pulse 88   Temp (!) 96.8 F (36 C)   Resp 20   Ht 5\' 9"  (1.753 m)   Wt 169 lb 9.6 oz (76.9 kg)   SpO2 97%   BMI 25.05 kg/m   General appearance: alert, cooperative and appears stated age Neck:  no adenopathy, no carotid bruit, no JVD, supple, symmetrical, trachea midline and thyroid not enlarged, symmetric, no tenderness/mass/nodules Lymph nodes: Cervical, supraclavicular, and axillary nodes normal. and Left neck incision is well-healed Back: symmetric, no curvature. ROM normal. No CVA tenderness. Cardio: regular rate and rhythm, S1, S2 normal, no murmur, click, rub or gallop GI: soft, non-tender; bowel sounds normal; no masses,  no organomegaly Extremities: extremities normal, atraumatic, no cyanosis or edema Neurologic: Grossly normal  Diagnostic Studies & Laboratory data:          Recent Radiology Findings:  CT CHEST ABDOMEN PELVIS W CONTRAST  Result Date: 12/11/2019 CLINICAL DATA:  Follow-up esophageal carcinoma. Previous surgery, chemotherapy, and radiation therapy. EXAM: CT CHEST, ABDOMEN, AND PELVIS WITH CONTRAST TECHNIQUE: Multidetector CT imaging of the chest, abdomen and pelvis was performed following the standard protocol during bolus administration of intravenous contrast. CONTRAST:  136mL OMNIPAQUE IOHEXOL 300 MG/ML  SOLN COMPARISON:  11/28/2018 FINDINGS: CT CHEST FINDINGS Cardiovascular: No acute findings. Aortic and coronary artery atherosclerosis noted. Mediastinum/Lymph Nodes: Stable postop changes from esophagectomy with gastric pull-through. No masses or pathologically enlarged lymph nodes identified. Lungs/Pleura: No pulmonary infiltrate or mass identified. No effusion present. Musculoskeletal:  No suspicious bone lesions identified. CT ABDOMEN AND PELVIS FINDINGS Hepatobiliary: No masses identified. Stable small cyst in left hepatic lobe. Prior cholecystectomy. No evidence of biliary obstruction. Pancreas:  No mass or inflammatory changes. Spleen:  Within normal limits in size and appearance. Adrenals/Urinary tract: Tiny left renal cyst again noted. No renal masses or hydronephrosis. A 12 mm polypoid soft tissue density with tiny calcifications is now seen along the floor  of the urinary bladder which is distinct from the prostate and suspicious for small bladder carcinoma. Stomach/Bowel: No evidence of obstruction, inflammatory process, or abnormal fluid collections. Diverticulosis is seen mainly involving the sigmoid colon, however there is no evidence of diverticulitis. Vascular/Lymphatic: No pathologically enlarged lymph nodes identified. No abdominal aortic aneurysm. Aortic atherosclerosis noted. Reproductive:  No mass or other significant abnormality identified. Other:  None. Musculoskeletal:  No suspicious bone lesions identified. IMPRESSION: No evidence of recurrent or metastatic esophageal carcinoma. 12 mm polypoid soft tissue density with tiny calcifications along the floor of the urinary bladder, suspicious for small primary bladder carcinoma. Cystoscopy is recommended for further evaluation. Colonic diverticulosis. No radiographic  evidence of diverticulitis. Aortic Atherosclerosis (ICD10-I70.0). Coronary artery atherosclerosis. Electronically Signed   By: Marlaine Hind M.D.   On: 12/11/2019 08:52     Recent Labs: Lab Results  Component Value Date   WBC 6.7 12/01/2019   HGB 15.7 12/01/2019   HCT 45.7 12/01/2019   PLT 222 12/01/2019   GLUCOSE 107 (H) 12/01/2019   TRIG 92 12/11/2016   ALT 17 12/01/2019   AST 14 (L) 12/01/2019   NA 141 12/01/2019   K 4.2 12/01/2019   CL 107 12/01/2019   CREATININE 1.32 (H) 12/01/2019   BUN 17 12/01/2019   CO2 26 12/01/2019   INR 0.98 03/22/2018      Assessment / Plan:   #1 status post transhiatal total esophagectomy and cervical esophagogastrostomy   patient doing well following treatment for carcinoma of the esophagus with surgical resection done 30 months ago.  Patient without evidence of recurrent esophageal cancer on physical exam or CT scan of the chest and abdomen   #2 abnormal findings on abdominal CT-suggesting possible bladder tumor, I reviewed these findings with the patient he wishes to discuss with his  primary care doctor-Dr. Doy Hutching and obtain a referral to urology through Dr. Doy Hutching.   Patient has close oncology follow-up at [facility], I have not made him a return appointment to be seen in the surgical office but would be glad to see him in his or oncology request as needed.   Grace Isaac 12/14/2019 8:33 PM

## 2019-12-15 ENCOUNTER — Other Ambulatory Visit: Payer: Self-pay | Admitting: Oncology

## 2019-12-15 DIAGNOSIS — N3289 Other specified disorders of bladder: Secondary | ICD-10-CM

## 2019-12-16 ENCOUNTER — Telehealth: Payer: Self-pay | Admitting: *Deleted

## 2019-12-16 NOTE — Telephone Encounter (Signed)
Called pt to let him know that the urology ref. Was put in and the office would get in touch with him. The patient had got a call because Dr. Roxy Horseman said  He needed referral and touched based with Janese Banks about it. The pt appt 10/5 and he will go and I asked him to call back and let usknow what they said. He is happy to talk with [place].

## 2019-12-17 ENCOUNTER — Ambulatory Visit (INDEPENDENT_AMBULATORY_CARE_PROVIDER_SITE_OTHER): Payer: Medicare Other | Admitting: Urology

## 2019-12-17 ENCOUNTER — Other Ambulatory Visit: Payer: Self-pay

## 2019-12-17 ENCOUNTER — Encounter: Payer: Self-pay | Admitting: Urology

## 2019-12-17 VITALS — BP 153/88 | HR 93 | Ht 69.0 in | Wt 169.0 lb

## 2019-12-17 DIAGNOSIS — N3289 Other specified disorders of bladder: Secondary | ICD-10-CM

## 2019-12-17 NOTE — Progress Notes (Signed)
12/17/2019 11:30 AM   Casey Acevedo January 30, 1941 [id number]  Referring provider: Sindy Guadeloupe, MD [place],  [place] [address]  Chief Complaint  Patient presents with   Other    HPI: Casey Acevedo is a 79 y.o. male seen at request of Dr. Janese Banks for evaluation of an incidental bladder mass seen on CT.   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 Junious Silk in 2018 for a PSA 6.6 and he elected surveillance     PMH: Past Medical History:  Diagnosis Date   Dysphagia    Esophageal cancer ([place]) 2018   Chemo + Rad tx's with surgical resection.     Surgical History: Past Surgical History:  Procedure Laterality Date   CHOLECYSTECTOMY     COMPLETE ESOPHAGECTOMY N/A 06/18/2017   Procedure: cervical ESOPHAGECTOMY COMPLETE;  Surgeon: Grace Isaac, MD;  Location: [facility] OR;  Service: Thoracic;  Laterality: N/A;  NEED NIMS ET TUBE   ESOPHAGOGASTRODUODENOSCOPY (EGD) WITH PROPOFOL N/A 12/07/2016   Procedure: ESOPHAGOGASTRODUODENOSCOPY (EGD) WITH PROPOFOL;  Surgeon: Jonathon Bellows, MD;  Location: [facility] ENDOSCOPY;  Service: Gastroenterology;  Laterality: N/A;   ESOPHAGOGASTRODUODENOSCOPY (EGD) WITH PROPOFOL N/A 12/26/2016   Procedure: ESOPHAGOGASTRODUODENOSCOPY (EGD) WITH PROPOFOL WITH DILATION;  Surgeon: Jonathon Bellows, MD;  Location: [facility] ENDOSCOPY;  Service: Gastroenterology;  Laterality: N/A;   EYE SURGERY     GASTROJEJUNOSTOMY N/A 01/16/2017   Procedure: LAPROSCOPIC ASSIST FEEDING JEJUNOSTOMY TUBE;  Surgeon: Johnathan Hausen, MD;  Location: [facility] ORS;  Service: General;  Laterality:  N/A;   IR FLUORO GUIDE PORT INSERTION RIGHT  01/10/2017   IR REMOVAL TUN ACCESS W/ PORT W/O FL MOD SED  03/22/2018   IR REPLACE G-TUBE SIMPLE WO FLUORO  03/15/2017   IR [place] DUODEN/JEJUNO TUBE PERCUT W/FLUORO  03/05/2017   PICC LINE INSERTION Right    PYLOROMYOTOMY N/A 06/18/2017   Procedure: Edwena Blow;  Surgeon: Grace Isaac, MD;  Location: [place];  Service: Thoracic;  Laterality: N/A;   VIDEO BRONCHOSCOPY N/A 06/18/2017   Procedure: VIDEO BRONCHOSCOPY;  Surgeon: Grace Isaac, MD;  Location: [place];  Service: Thoracic;  Laterality: N/A;    Home Medications:  Allergies as of 12/17/2019   No Known Allergies     Medication List    as of December 17, 2019 11:30 AM   You have not been prescribed any medications.     Allergies: No Known Allergies  Family History: Family History  Problem Relation Age of Onset   Heart disease Father    Diabetes Mother    Hypertension Mother    Heart attack Brother 60   Prostate cancer Neg Hx    Bladder Cancer Neg Hx    Kidney cancer Neg Hx     Social History:  reports that he has never smoked. He has never used smokeless tobacco. He reports that he does not drink alcohol and does not use drugs.   Physical Exam: BP (!) 153/88    Pulse 93    Ht _0  (1.753 m)    Wt 169 lb (76.7 kg)    BMI 24.96 kg/m   Constitutional:  Alert and  oriented, No acute distress. HEENT: [place] AT, moist mucus membranes.  Trachea midline, no masses. Cardiovascular: No clubbing, cyanosis, or edema. Respiratory: Normal respiratory effort, no increased work of breathing. GI: Abdomen is soft, nontender, nondistended, no abdominal masses GU: Prostate 60 g, smooth without nodules Lymph: No cervical or inguinal lymphadenopathy. Skin: No rashes, bruises or suspicious lesions. Neurologic: Grossly intact, no focal deficits, moving all 4 extremities. Psychiatric: Normal mood and affect.   Assessment & Plan:    1.  Bladder mass  Incidental finding on  CT.  We discussed CT imaging of the bladder is not ideal for evaluation of bladder masses.  We discussed possibility of urothelial carcinoma and I recommended scheduling office cystoscopy  2.  Elevated PSA  Most recent PSA above baseline at 7.91.  If he does require TURBT will discuss transrectal prostate biopsy under the same anesthetic   Abbie Sons, MD  [facility] [address] [place], [place] [address] [phone]

## 2019-12-22 ENCOUNTER — Encounter: Payer: Self-pay | Admitting: Urology

## 2020-01-01 NOTE — Progress Notes (Addendum)
   01/02/2020  CC:  Chief Complaint  Patient presents with  . Cysto    [id number] Casey Acevedo is a 79 y.o. male who is seen today for a cystoscopy.    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 NED. A&Ox3.   No respiratory distress   Abd soft, NT, ND Normal phallus with bilateral descended testicles  Cystoscopy Procedure Note  Patient identification was confirmed, informed consent was obtained, and patient was prepped using Betadine solution.  Lidocaine jelly was administered per urethral meatus.     Pre-Procedure: - Inspection reveals a normal caliber ureteral meatus.  Procedure: The flexible cystoscope was introduced without difficulty - No urethral strictures/lesions are present. - Moderately enlarged prostate  - Normal bladder neck - Bilateral ureteral orifices identified - There was ~ 2 cm papillary lesion at the dome of the bladder and a 1 cm papillary lesion posterior to the 2 cm lesion - Bladder mucosa  reveals no ulcers, or lesions - No bladder stones - No trabeculation  Retroflexion shows the above mentioned tumor.   Post-Procedure: - Patient tolerated the procedure well  Assessment/ Plan:  1. Bladder tumor Endoscopically consistent with tumor The indications and nature of the planned procedure were discussed as well as the potential benefits and expected outcome.  Alternatives have been discussed.  Potential complications were discussed including but not limited to bleeding, infection and bladder perforation. The postoperative care and followup was  reviewed.  The patient was informed that he may need additional treatment along with periodic surveillance cystoscopy.  All of his questions were answered and he desires to proceed.  Will plan on post resection gemcitabine   I, Casey Acevedo, am acting as a scribe for Dr. Nicki Reaper C. Terell Acevedo,  I have reviewed the above documentation for accuracy and completeness, and I agree with the above.   Casey Sons, MD

## 2020-01-01 NOTE — H&P (View-Only) (Signed)
   01/02/2020  CC:  Chief Complaint  Patient presents with  . Cysto    Casey Acevedo is a 79 y.o. male who is seen today for a cystoscopy.    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 NED. A&Ox3.   No respiratory distress   Abd soft, NT, ND Normal phallus with bilateral descended testicles  Cystoscopy Procedure Note  Patient identification was confirmed, informed consent was obtained, and patient was prepped using Betadine solution.  Lidocaine jelly was administered per urethral meatus.     Pre-Procedure: - Inspection reveals a normal caliber ureteral meatus.  Procedure: The flexible cystoscope was introduced without difficulty - No urethral strictures/lesions are present. - Moderately enlarged prostate  - Normal bladder neck - Bilateral ureteral orifices identified - There was ~ 2 cm papillary lesion at the dome of the bladder and a 1 cm papillary lesion posterior to the 2 cm lesion - Bladder mucosa  reveals no ulcers, or lesions - No bladder stones - No trabeculation  Retroflexion shows the above mentioned tumor.   Post-Procedure: - Patient tolerated the procedure well  Assessment/ Plan:  1. Bladder tumor Endoscopically consistent with tumor The indications and nature of the planned procedure were discussed as well as the potential benefits and expected outcome.  Alternatives have been discussed.  Potential complications were discussed including but not limited to bleeding, infection and bladder perforation. The postoperative care and followup was  reviewed.  The patient was informed that he may need additional treatment along with periodic surveillance cystoscopy.  All of his questions were answered and he desires to proceed.  Will plan on post resection gemcitabine   I, Casey Acevedo, am acting as a scribe for Dr. Nicki Reaper C. Dayle Sherpa,  I have reviewed the above documentation for accuracy and completeness, and I agree with the above.   Abbie Sons, MD

## 2020-01-02 ENCOUNTER — Other Ambulatory Visit: Payer: Self-pay

## 2020-01-02 ENCOUNTER — Ambulatory Visit (INDEPENDENT_AMBULATORY_CARE_PROVIDER_SITE_OTHER): Payer: Medicare Other | Admitting: Urology

## 2020-01-02 ENCOUNTER — Encounter: Payer: Self-pay | Admitting: Urology

## 2020-01-02 VITALS — BP 129/84 | HR 76 | Ht 69.0 in | Wt 169.0 lb

## 2020-01-02 DIAGNOSIS — D494 Neoplasm of unspecified behavior of bladder: Secondary | ICD-10-CM

## 2020-01-02 DIAGNOSIS — N3289 Other specified disorders of bladder: Secondary | ICD-10-CM | POA: Diagnosis not present

## 2020-01-03 LAB — URINALYSIS, COMPLETE
Bilirubin, UA: NEGATIVE
Glucose, UA: NEGATIVE
Ketones, UA: NEGATIVE
Leukocytes,UA: NEGATIVE
Nitrite, UA: NEGATIVE
Protein,UA: NEGATIVE
Specific Gravity, UA: >1.03 — ABNORMAL HIGH (ref 1.005–1.030)
Urobilinogen, Ur: 0.2 mg/dL (ref 0.2–1.0)
pH, UA: 5 (ref 5.0–7.5)

## 2020-01-03 LAB — MICROSCOPIC EXAMINATION: Bacteria, UA: NONE SEEN

## 2020-01-05 ENCOUNTER — Other Ambulatory Visit: Payer: Self-pay | Admitting: Radiology

## 2020-01-05 DIAGNOSIS — D494 Neoplasm of unspecified behavior of bladder: Secondary | ICD-10-CM

## 2020-01-05 LAB — CULTURE, URINE COMPREHENSIVE

## 2020-01-05 MED ORDER — GEMCITABINE CHEMO FOR BLADDER INSTILLATION 2000 MG: 2000.0000 mg | Freq: Once | INTRAVENOUS | Status: DC

## 2020-01-08 ENCOUNTER — Other Ambulatory Visit: Payer: Self-pay

## 2020-01-08 ENCOUNTER — Encounter
Admission: RE | Admit: 2020-01-08 | Discharge: 2020-01-08 | Disposition: A | Discharge status: Home / Self Care | Payer: Medicare Other | Source: Ambulatory Visit | Attending: Urology | Admitting: Urology

## 2020-01-08 NOTE — Patient Instructions (Addendum)
Your procedure is scheduled on: 01/13/20- Tuesday Report to Day Surgery on the 2nd floor of the [facility]. To find out your arrival time, please call [phone] between 1PM - 3PM on: 01/12/20 - Monday  REMEMBER: Instructions that are not followed completely may result in serious medical risk, up to and including death; or upon the discretion of your surgeon and anesthesiologist your surgery may need to be rescheduled.  Do not eat food after midnight the night before surgery.  No gum chewing, lozengers or hard candies.  You may however, drink CLEAR liquids up to 2 hours before you are scheduled to arrive for your surgery. Do not drink anything within 2 hours of your scheduled arrival time.  Clear liquids include: - water  - apple juice without pulp - gatorade (not RED, PURPLE, OR BLUE) - black coffee or tea (Do NOT add milk or creamers to the coffee or tea) Do NOT drink anything that is not on this list.  Type 1 and Type 2 diabetics should only drink water.  TAKE THESE MEDICATIONS THE MORNING OF SURGERY WITH A SIP OF WATER: Tylenol if needed  Stop Anti-inflammatories (NSAIDS) such as Advil, Aleve, Ibuprofen, Motrin, Naproxen, Naprosyn and Aspirin based products such as Excedrin, Goodys Powder, BC Powder.  Stop ANY OVER THE COUNTER supplements until after surgery. (You may continue taking Tylenol, Vitamin D, Vitamin B, and multivitamin.)  No Alcohol for 24 hours before or after surgery.  No Smoking including e-cigarettes for 24 hours prior to surgery.  No chewable tobacco products for at least 6 hours prior to surgery.  No nicotine patches on the day of surgery.  Do not use any "recreational" drugs for at least a week prior to your surgery.  Please be advised that the combination of cocaine and anesthesia may have negative outcomes, up to and including death. If you test positive for cocaine, your surgery will be cancelled.  On the morning of surgery brush your teeth with  toothpaste and water, you may rinse your mouth with mouthwash if you wish. Do not swallow any toothpaste or mouthwash.  Do not wear jewelry, make-up, hairpins, clips or nail polish.  Do not wear lotions, powders, or perfumes.   Do not shave 48 hours prior to surgery.   Contact lenses, hearing aids and dentures may not be worn into surgery.  Do not bring valuables to the hospital. [facility] is not responsible for any missing/lost belongings or valuables.   Notify your doctor if there is any change in your medical condition (cold, fever, infection).  Wear comfortable clothing (specific to your surgery type) to the hospital.  Plan for stool softeners for home use; pain medications have a tendency to cause constipation. You can also help prevent constipation by eating foods high in fiber such as fruits and vegetables and drinking plenty of fluids as your diet allows.  After surgery, you can help prevent lung complications by doing breathing exercises.  Take deep breaths and cough every 1-2 hours. Your doctor may order a device called an Incentive Spirometer to help you take deep breaths. When coughing or sneezing, hold a pillow firmly against your incision with both hands. This is called "splinting." Doing this helps protect your incision. It also decreases belly discomfort.  If you are being admitted to the hospital overnight, leave your suitcase in the car. After surgery it may be brought to your room.  If you are being discharged the day of surgery, you will not be allowed  to drive home. You will need a responsible adult (18 years or older) to drive you home and stay with you that night.   If you are taking public transportation, you will need to have a responsible adult (18 years or older) with you. Please confirm with your physician that it is acceptable to use public transportation.   Please call the [facility]. at [phone] if you have any questions about  these instructions.  Visitation Policy:  Patients undergoing a surgery or procedure may have one family member or support person with them as long as that person is not COVID-19 positive or experiencing its symptoms.  That person may remain in the waiting area during the procedure.  Inpatient Visitation Update:   In an effort to ensure the safety of our team members and our patients, we are implementing a change to our visitation policy:  Effective Monday, Aug. 9, at 7 a.m., inpatients will be allowed one support person.  o The support person may change daily.  o The support person must pass our screening, gel in and out, and wear a mask at all times, including in the patient's room.  o Patients must also wear a mask when staff or their support person are in the room.  o Masking is required regardless of vaccination status.  Systemwide, no visitors 17 or younger.

## 2020-01-09 ENCOUNTER — Encounter
Admission: RE | Admit: 2020-01-09 | Discharge: 2020-01-09 | Disposition: A | Discharge status: Home / Self Care | Payer: Medicare Other | Source: Ambulatory Visit | Attending: Urology | Admitting: Urology

## 2020-01-09 DIAGNOSIS — Z01810 Encounter for preprocedural cardiovascular examination: Secondary | ICD-10-CM

## 2020-01-09 DIAGNOSIS — Z20822 Contact with and (suspected) exposure to covid-19: Secondary | ICD-10-CM | POA: Diagnosis not present

## 2020-01-09 DIAGNOSIS — Z01818 Encounter for other preprocedural examination: Secondary | ICD-10-CM | POA: Insufficient documentation

## 2020-01-09 DIAGNOSIS — R54 Age-related physical debility: Secondary | ICD-10-CM | POA: Diagnosis not present

## 2020-01-09 LAB — SARS CORONAVIRUS 2 (TAT 6-24 HRS): SARS Coronavirus 2: NEGATIVE

## 2020-01-12 MED ORDER — CHLORHEXIDINE GLUCONATE 0.12 % MT SOLN: 15.0000 mL | Freq: Once | OROMUCOSAL | Status: AC

## 2020-01-12 MED ORDER — LACTATED RINGERS IV SOLN: INTRAVENOUS | Status: DC

## 2020-01-12 MED ORDER — CEFAZOLIN SODIUM-DEXTROSE 2-4 GM/100ML-% IV SOLN
2.0000 g | INTRAVENOUS | Status: AC
  Administered 2020-01-13: 2 g via INTRAVENOUS

## 2020-01-12 MED ORDER — FAMOTIDINE 20 MG PO TABS: 20.0000 mg | ORAL_TABLET | Freq: Once | ORAL | Status: AC

## 2020-01-12 MED ORDER — ORAL CARE MOUTH RINSE: 15.0000 mL | Freq: Once | OROMUCOSAL | Status: AC

## 2020-01-13 ENCOUNTER — Encounter
Admission: RE | Disposition: A | Discharge status: Home / Self Care | Payer: Self-pay | Source: Home / Self Care | Attending: Urology

## 2020-01-13 ENCOUNTER — Ambulatory Visit: Payer: Medicare Other | Admitting: Anesthesiology

## 2020-01-13 ENCOUNTER — Other Ambulatory Visit: Payer: Self-pay

## 2020-01-13 ENCOUNTER — Ambulatory Visit
Admission: RE | Admit: 2020-01-13 | Discharge: 2020-01-13 | Disposition: A | Discharge status: Home / Self Care | Payer: Medicare Other | Attending: Urology | Admitting: Urology

## 2020-01-13 ENCOUNTER — Encounter: Payer: Self-pay | Admitting: Urology

## 2020-01-13 ENCOUNTER — Telehealth: Payer: Self-pay | Admitting: Radiology

## 2020-01-13 DIAGNOSIS — Z9049 Acquired absence of other specified parts of digestive tract: Secondary | ICD-10-CM | POA: Diagnosis not present

## 2020-01-13 DIAGNOSIS — I2510 Atherosclerotic heart disease of native coronary artery without angina pectoris: Secondary | ICD-10-CM | POA: Diagnosis not present

## 2020-01-13 DIAGNOSIS — Z9221 Personal history of antineoplastic chemotherapy: Secondary | ICD-10-CM | POA: Insufficient documentation

## 2020-01-13 DIAGNOSIS — K219 Gastro-esophageal reflux disease without esophagitis: Secondary | ICD-10-CM | POA: Diagnosis not present

## 2020-01-13 DIAGNOSIS — Z923 Personal history of irradiation: Secondary | ICD-10-CM | POA: Diagnosis not present

## 2020-01-13 DIAGNOSIS — D494 Neoplasm of unspecified behavior of bladder: Secondary | ICD-10-CM

## 2020-01-13 DIAGNOSIS — Z8501 Personal history of malignant neoplasm of esophagus: Secondary | ICD-10-CM | POA: Insufficient documentation

## 2020-01-13 DIAGNOSIS — C673 Malignant neoplasm of anterior wall of bladder: Secondary | ICD-10-CM | POA: Insufficient documentation

## 2020-01-13 HISTORY — PX: TRANSURETHRAL RESECTION OF BLADDER TUMOR WITH MITOMYCIN-C: SHX6459

## 2020-01-13 SURGERY — TRANSURETHRAL RESECTION OF BLADDER TUMOR WITH MITOMYCIN-C
Anesthesia: General | Site: Bladder

## 2020-01-13 MED ORDER — CEFAZOLIN SODIUM-DEXTROSE 2-4 GM/100ML-% IV SOLN
INTRAVENOUS | Status: AC
  Filled 2020-01-13: qty 100

## 2020-01-13 MED ORDER — CHLORHEXIDINE GLUCONATE 0.12 % MT SOLN
OROMUCOSAL | Status: AC
  Administered 2020-01-13: 15 mL via OROMUCOSAL
  Filled 2020-01-13: qty 15

## 2020-01-13 MED ORDER — URELLE 81 MG PO TABS: 1.0000 | ORAL_TABLET | Freq: Three times a day (TID) | ORAL | 0 refills | Status: DC | PRN

## 2020-01-13 MED ORDER — ONDANSETRON HCL 4 MG/2ML IJ SOLN
INTRAMUSCULAR | Status: AC
  Filled 2020-01-13: qty 2

## 2020-01-13 MED ORDER — DEXAMETHASONE SODIUM PHOSPHATE 10 MG/ML IJ SOLN
INTRAMUSCULAR | Status: DC | PRN
  Administered 2020-01-13: 10 mg via INTRAVENOUS

## 2020-01-13 MED ORDER — GEMCITABINE CHEMO FOR BLADDER INSTILLATION 2000 MG
INTRAVENOUS | Status: DC | PRN
  Administered 2020-01-13: 2000 mg via INTRAVESICAL

## 2020-01-13 MED ORDER — ONDANSETRON HCL 4 MG/2ML IJ SOLN
INTRAMUSCULAR | Status: DC | PRN
  Administered 2020-01-13: 4 mg via INTRAVENOUS

## 2020-01-13 MED ORDER — LIDOCAINE HCL (PF) 2 % IJ SOLN
INTRAMUSCULAR | Status: AC
  Filled 2020-01-13: qty 5

## 2020-01-13 MED ORDER — FENTANYL CITRATE (PF) 100 MCG/2ML IJ SOLN
INTRAMUSCULAR | Status: DC | PRN
  Administered 2020-01-13: 25 ug via INTRAVENOUS
  Administered 2020-01-13: 50 ug via INTRAVENOUS

## 2020-01-13 MED ORDER — FAMOTIDINE 20 MG PO TABS
ORAL_TABLET | ORAL | Status: AC
  Administered 2020-01-13: 20 mg via ORAL
  Filled 2020-01-13: qty 1

## 2020-01-13 MED ORDER — DEXAMETHASONE SODIUM PHOSPHATE 10 MG/ML IJ SOLN
INTRAMUSCULAR | Status: AC
  Filled 2020-01-13: qty 1

## 2020-01-13 MED ORDER — ROCURONIUM BROMIDE 100 MG/10ML IV SOLN
INTRAVENOUS | Status: DC | PRN
  Administered 2020-01-13: 20 mg via INTRAVENOUS

## 2020-01-13 MED ORDER — PROPOFOL 10 MG/ML IV BOLUS
INTRAVENOUS | Status: DC | PRN
  Administered 2020-01-13: 100 mg via INTRAVENOUS

## 2020-01-13 MED ORDER — FENTANYL CITRATE (PF) 100 MCG/2ML IJ SOLN
INTRAMUSCULAR | Status: AC
  Filled 2020-01-13: qty 2

## 2020-01-13 MED ORDER — SUCCINYLCHOLINE CHLORIDE 20 MG/ML IJ SOLN
INTRAMUSCULAR | Status: DC | PRN
  Administered 2020-01-13: 100 mg via INTRAVENOUS

## 2020-01-13 MED ORDER — PHENYLEPHRINE HCL (PRESSORS) 10 MG/ML IV SOLN
INTRAVENOUS | Status: DC | PRN
  Administered 2020-01-13 (×2): 100 ug via INTRAVENOUS

## 2020-01-13 MED ORDER — PROPOFOL 10 MG/ML IV BOLUS
INTRAVENOUS | Status: AC
  Filled 2020-01-13: qty 20

## 2020-01-13 MED ORDER — ROCURONIUM BROMIDE 10 MG/ML (PF) SYRINGE
PREFILLED_SYRINGE | INTRAVENOUS | Status: AC
  Filled 2020-01-13: qty 10

## 2020-01-13 MED ORDER — OXYCODONE HCL 5 MG PO TABS: 5.0000 mg | ORAL_TABLET | Freq: Once | ORAL | Status: DC | PRN

## 2020-01-13 MED ORDER — SUGAMMADEX SODIUM 200 MG/2ML IV SOLN
INTRAVENOUS | Status: DC | PRN
  Administered 2020-01-13: 200 mg via INTRAVENOUS

## 2020-01-13 MED ORDER — OXYCODONE HCL 5 MG/5ML PO SOLN: 5.0000 mg | Freq: Once | ORAL | Status: DC | PRN

## 2020-01-13 MED ORDER — FENTANYL CITRATE (PF) 100 MCG/2ML IJ SOLN: 25.0000 ug | INTRAMUSCULAR | Status: DC | PRN

## 2020-01-13 MED ORDER — ONDANSETRON HCL 4 MG/2ML IJ SOLN: 4.0000 mg | Freq: Once | INTRAMUSCULAR | Status: DC | PRN

## 2020-01-13 MED ORDER — SUCCINYLCHOLINE CHLORIDE 200 MG/10ML IV SOSY
PREFILLED_SYRINGE | INTRAVENOUS | Status: AC
  Filled 2020-01-13: qty 10

## 2020-01-13 MED ORDER — LIDOCAINE HCL (CARDIAC) PF 100 MG/5ML IV SOSY
PREFILLED_SYRINGE | INTRAVENOUS | Status: DC | PRN
  Administered 2020-01-13: 80 mg via INTRAVENOUS

## 2020-01-13 SURGICAL SUPPLY — 25 items
BAG DRAIN CYSTO-URO LG1000N (MISCELLANEOUS) ×3 IMPLANT
BAG DRN RND TRDRP ANRFLXCHMBR (UROLOGICAL SUPPLIES) ×1
BAG URINE DRAIN 2000ML AR STRL (UROLOGICAL SUPPLIES) ×3 IMPLANT
CATH FOLEY 2WAY 18X30 (CATHETERS) IMPLANT
CATH FOLEY 2WAY SIL 18X30 (CATHETERS)
DRAPE UTILITY 15X26 TOWEL STRL (DRAPES) ×3 IMPLANT
DRSG TELFA 4X3 1S NADH ST (GAUZE/BANDAGES/DRESSINGS) ×3 IMPLANT
ELECT LOOP 22F BIPOLAR SML (ELECTROSURGICAL) ×3
ELECT REM PT RETURN 9FT ADLT (ELECTROSURGICAL)
ELECTRODE LOOP 22F BIPOLAR SML (ELECTROSURGICAL) IMPLANT
ELECTRODE REM PT RTRN 9FT ADLT (ELECTROSURGICAL) IMPLANT
GLOVE BIOGEL PI IND STRL 7.5 (GLOVE) ×1 IMPLANT
GLOVE BIOGEL PI INDICATOR 7.5 (GLOVE) ×2
GOWN STRL REUS W/ TWL LRG LVL3 (GOWN DISPOSABLE) ×1 IMPLANT
GOWN STRL REUS W/TWL LRG LVL3 (GOWN DISPOSABLE) ×3
GOWN STRL REUS W/TWL XL LVL4 (GOWN DISPOSABLE) ×3 IMPLANT
IV NS IRRIG 3000ML ARTHROMATIC (IV SOLUTION) ×6 IMPLANT
KIT TURNOVER CYSTO (KITS) ×3 IMPLANT
LOOP CUT BIPOLAR 24F LRG (ELECTROSURGICAL) IMPLANT
PACK CYSTO AR (MISCELLANEOUS) ×3 IMPLANT
SET IRRIG Y TYPE TUR BLADDER L (SET/KITS/TRAYS/PACK) ×3 IMPLANT
SURGILUBE 2OZ TUBE FLIPTOP (MISCELLANEOUS) ×3 IMPLANT
SYR TOOMEY IRRIG 70ML (MISCELLANEOUS) ×3
SYRINGE TOOMEY IRRIG 70ML (MISCELLANEOUS) ×1 IMPLANT
WATER STERILE IRR 1000ML POUR (IV SOLUTION) ×3 IMPLANT

## 2020-01-13 NOTE — Telephone Encounter (Signed)
Talked to DR. Stoioff , patient to get over the counter AZO.

## 2020-01-13 NOTE — Anesthesia Preprocedure Evaluation (Addendum)
Anesthesia Evaluation  Patient identified by MRN, date of birth, ID band Patient awake    Reviewed: Allergy & Precautions, H&P , NPO status , Patient's Chart, lab work & pertinent test results  History of Anesthesia Complications Negative for: history of anesthetic complications  Airway Mallampati: III  TM Distance: >3 FB Neck ROM: full    Dental  (+) Teeth Intact   Pulmonary neg pulmonary ROS, neg sleep apnea, neg COPD,    breath sounds clear to auscultation       Cardiovascular Exercise Tolerance: Good (-) angina+ CAD  (-) Past MI and (-) Cardiac Stents (-) dysrhythmias  Rhythm:regular Rate:Normal     Neuro/Psych negative neurological ROS  negative psych ROS   GI/Hepatic Neg liver ROS, GERD  ,S/p esophagectomy for esophageal cancer   Endo/Other  negative endocrine ROS  Renal/GU      Musculoskeletal   Abdominal   Peds  Hematology negative hematology ROS (+)   Anesthesia Other Findings Past Medical History: No date: Dysphagia 2018: Esophageal cancer ([place])     Comment:  Chemo + Rad tx's with surgical resection.   Past Surgical History: No date: CHOLECYSTECTOMY 06/18/2017: COMPLETE ESOPHAGECTOMY; N/A     Comment:  Procedure: cervical ESOPHAGECTOMY COMPLETE;  Surgeon:               Grace Isaac, MD;  Location: [facility] OR;  Service:               Thoracic;  Laterality: N/A;  NEED NIMS ET TUBE 12/07/2016: ESOPHAGOGASTRODUODENOSCOPY (EGD) WITH PROPOFOL; N/A     Comment:  Procedure: ESOPHAGOGASTRODUODENOSCOPY (EGD) WITH               PROPOFOL;  Surgeon: Jonathon Bellows, MD;  Location: [facility]               ENDOSCOPY;  Service: Gastroenterology;  Laterality: N/A; 12/26/2016: ESOPHAGOGASTRODUODENOSCOPY (EGD) WITH PROPOFOL; N/A     Comment:  Procedure: ESOPHAGOGASTRODUODENOSCOPY (EGD) WITH               PROPOFOL WITH DILATION;  Surgeon: Jonathon Bellows, MD;                Location: [facility] ENDOSCOPY;  Service: Gastroenterology;                 Laterality: N/A; No date: EYE SURGERY 01/16/2017: GASTROJEJUNOSTOMY; N/A     Comment:  Procedure: LAPROSCOPIC ASSIST FEEDING JEJUNOSTOMY TUBE;               Surgeon: Johnathan Hausen, MD;  Location: [facility] ORS;                Service: General;  Laterality: N/A; 01/10/2017: IR FLUORO GUIDE PORT INSERTION RIGHT 03/22/2018: IR REMOVAL TUN ACCESS W/ PORT W/O FL MOD SED 03/15/2017: IR REPLACE G-TUBE SIMPLE WO FLUORO 03/05/2017: IR [place] DUODEN/JEJUNO TUBE PERCUT W/FLUORO No date: PICC LINE INSERTION; Right 06/18/2017: Edwena Blow; N/A     Comment:  Procedure: PYLOROMYOTOMY;  Surgeon: Grace Isaac,               MD;  Location: [facility];  Service: Thoracic;  Laterality:               N/A; 06/18/2017: VIDEO BRONCHOSCOPY; N/A     Comment:  Procedure: VIDEO BRONCHOSCOPY;  Surgeon: Grace Isaac, MD;  Location: [facility];  Service: Thoracic;  Laterality: N/A;  BMI    Body Mass Index: 25.10 kg/m      Reproductive/Obstetrics negative OB ROS                            Anesthesia Physical Anesthesia Plan  ASA: II  Anesthesia Plan: General ETT and Rapid Sequence   Post-op Pain Management:    Induction:   PONV Risk Score and Plan: Ondansetron, Dexamethasone and Treatment may vary due to age or medical condition  Airway Management Planned:   Additional Equipment:   Intra-op Plan:   Post-operative Plan:   Informed Consent: I have reviewed the patients History and Physical, chart, labs and discussed the procedure including the risks, benefits and alternatives for the proposed anesthesia with the patient or authorized representative who has indicated his/her understanding and acceptance.     Dental Advisory Given  Plan Discussed with: Anesthesiologist, CRNA and Surgeon  Anesthesia Plan Comments:        Anesthesia Quick Evaluation

## 2020-01-13 NOTE — Anesthesia Procedure Notes (Addendum)
Procedure Name: Intubation Date/Time: 01/13/2020 8:47 AM Performed by: Hedda Slade, CRNA Pre-anesthesia Checklist: Patient identified, Patient being monitored, Timeout performed, Emergency Drugs available and Suction available Patient Re-evaluated:Patient Re-evaluated prior to induction Oxygen Delivery Method: Circle system utilized Preoxygenation: Pre-oxygenation with 100% oxygen Induction Type: IV induction and Rapid sequence Laryngoscope Size: McGraph and 4 Grade View: Grade I Tube type: Oral Tube size: 7.0 mm Number of attempts: 1 Airway Equipment and Method: Stylet and Video-laryngoscopy Placement Confirmation: ETT inserted through vocal cords under direct vision,  positive ETCO2 and breath sounds checked- equal and bilateral Secured at: 21 cm Tube secured with: Tape Dental Injury: Teeth and Oropharynx as per pre-operative assessment  Difficulty Due To: Difficult Airway- due to limited oral opening, Difficulty was anticipated and Difficult Airway- due to anterior larynx Future Recommendations: Recommend- induction with short-acting agent, and alternative techniques readily available

## 2020-01-13 NOTE — OR Nursing (Signed)
Per Dr. Elenor Quinones, secure chat, he will be in postop to evaluate urine color in foley tubing approx. noonish.

## 2020-01-13 NOTE — Interval H&P Note (Signed)
History and Physical Interval Note:  01/13/2020 8:25 AM  Casey Acevedo  has presented today for surgery, with the diagnosis of bladder tumor.  The various methods of treatment have been discussed with the patient and family. After consideration of risks, benefits and other options for treatment, the patient has consented to  Procedure(s): TRANSURETHRAL RESECTION OF BLADDER TUMOR WITH gemcitabine (N/A) as a surgical intervention.  The patient's history has been reviewed, patient examined, no change in status, stable for surgery.  I have reviewed the patient's chart and labs.  Questions were answered to the patient's satisfaction.     [place]

## 2020-01-13 NOTE — Telephone Encounter (Signed)
Patient may come by the office and pick up some samples.

## 2020-01-13 NOTE — Discharge Instructions (Signed)
AMBULATORY SURGERY  DISCHARGE INSTRUCTIONS   1) The drugs that you were given will stay in your system until tomorrow so for the next 24 hours you should not:  A) Drive an automobile B) Make any legal decisions C) Drink any alcoholic beverage   2) You may resume regular meals tomorrow.  Today it is better to start with liquids and gradually work up to solid foods.  You may eat anything you prefer, but it is better to start with liquids, then soup and crackers, and gradually work up to solid foods.   3) Please notify your doctor immediately if you have any unusual bleeding, trouble breathing, redness and pain at the surgery site, drainage, fever, or pain not relieved by medication.    4) Additional Instructions:        Please contact your physician with any problems or Same Day Surgery at [phone], Monday through Friday 6 am to 4 pm, or [place] at [facility] number at [phone].Transurethral Resection of Bladder Tumor (TURBT) or Bladder Biopsy   Definition:  Transurethral Resection of the Bladder Tumor is a surgical procedure used to diagnose and remove tumors within the bladder.   General instructions:     Your recent bladder surgery requires very little post hospital care but some definite precautions.  Despite the fact that no skin incisions were used, the area around the bladder incisions are raw and covered with scabs to promote healing and prevent bleeding. Certain precautions are needed to insure that the scabs are not disturbed over the next 2-4 weeks while the healing proceeds.  Because the raw surface inside your bladder and the irritating effects of urine you may expect frequency of urination and/or urgency (a stronger desire to urinate) and perhaps even getting up at night more often. This will usually resolve or improve slowly over the healing period. You may see some blood in your urine over the first 6 weeks. Do not be alarmed, even if the urine was  clear for a while. Get off your feet and drink lots of fluids until clearing occurs. If you start to pass clots or don't improve call us.  Diet:  You may return to your normal diet immediately. Because of the raw surface of your bladder, alcohol, spicy foods, foods high in acid and drinks with caffeine may cause irritation or frequency and should be used in moderation. To keep your urine flowing freely and avoid constipation, drink plenty of fluids during the day (8-10 glasses). Tip: Avoid cranberry juice because it is very acidic.  Activity:  Your physical activity doesn't need to be restricted. However, if you are very active, you may see some blood in the urine. We suggest that you reduce your activity under the circumstances until the bleeding has stopped.  Bowels:  It is important to keep your bowels regular during the postoperative period. Straining with bowel movements can cause bleeding. A bowel movement every other day is reasonable. Use a mild laxative if needed, such as milk of magnesia 2-3 tablespoons, or 2 Dulcolax tablets. Call if you continue to have problems. If you had been taking narcotics for pain, before, during or after your surgery, you may be constipated. Take a laxative if necessary.    Medication:  You should resume your pre-surgery medications unless told not to. In addition you may be given an antibiotic to prevent or treat infection. Antibiotics are not always necessary. All medication should be taken as prescribed until the bottles are finished unless  you are having an unusual reaction to one of the drugs.  A prescription for Urelle for urinary frequency, urgency and burning was sent to your pharmacy  Follow-up:  You will be contacted once your pathology report is received and follow-up recommendations will be made at that time.   [facility] [address], [place], [place] [address] [phone]

## 2020-01-13 NOTE — Transfer of Care (Signed)
Immediate Anesthesia Transfer of Care Note  Patient: Casey Acevedo  Procedure(s) Performed: TRANSURETHRAL RESECTION OF BLADDER TUMOR WITH gemcitabine (N/A Bladder)  Patient Location: PACU  Anesthesia Type:General  Level of Consciousness: sedated  Airway & Oxygen Therapy: Patient Spontanous Breathing and Patient connected to face mask oxygen  Post-op Assessment: Report given to RN and Post -op Vital signs reviewed and stable  Post vital signs: Reviewed and stable  Last Vitals:  Vitals Value Taken Time  BP    Temp    Pulse    Resp    SpO2      Last Pain:  Vitals:   01/13/20 0941  TempSrc:   PainSc: 0-No pain         Complications: No complications documented.

## 2020-01-13 NOTE — Interval H&P Note (Signed)
History and Physical Interval Note: CV: RRR Lungs: Clear  01/13/2020 8:26 AM  Casey Acevedo  has presented today for surgery, with the diagnosis of bladder tumor.  The various methods of treatment have been discussed with the patient and family. After consideration of risks, benefits and other options for treatment, the patient has consented to  Procedure(s): TRANSURETHRAL RESECTION OF BLADDER TUMOR WITH gemcitabine (N/A) as a surgical intervention.  The patient's history has been reviewed, patient examined, no change in status, stable for surgery.  I have reviewed the patient's chart and labs.  Questions were answered to the patient's satisfaction.     Schlusser

## 2020-01-13 NOTE — Progress Notes (Signed)
WIll leave foley in for now and let drain, IV fluids open, will see if urine clears from bloody, per Dr Elenor Quinones

## 2020-01-13 NOTE — Telephone Encounter (Signed)
Wife states she has been unable to find a pharmacy that has Urelle in [place]. She asks that a different medication be sent to pharmacy.

## 2020-01-13 NOTE — Op Note (Signed)
Preoperative diagnosis: 1. Bladder tumor (3 cm)  Postoperative diagnosis:  1. Bladder tumor (3 cm)  Procedure:  1. Cystoscopy 2. Transurethral resection of bladder tumor (2-5 cm) 3. Instillation intravesical gemcitabine  Surgeon: Nicki Reaper C. Lennell Shanks, M.D.  Anesthesia: General  Complications: None  Intraoperative findings:  1. Cystoscopy-wide caliber bulbar stricture; moderate lateral lobe enlargement with mild bladder neck elevation; ureteral orifices normal-appearing with clear efflux, 3 cm papillary bladder tumor anterior wall near bladder neck with superficial calcification   EBL: Minimal  Specimens: 1. Bladder tumor 2. Base of tumor      Indication: Casey Acevedo is a 79 y.o male with a history of esophageal cancer who underwent a surveillance CT chest/abdomen/pelvis in late September 2021 with incidental finding of an enhancing polypoid bladder lesion.  Office cystoscopy remarkable for a papillary tumor.  After reviewing the management options for treatment, he elected to proceed with the above surgical procedure(s). We have discussed the potential benefits and risks of the procedure, side effects of the proposed treatment, the likelihood of the patient achieving the goals of the procedure, and any potential problems that might occur during the procedure or recuperation. Informed consent has been obtained.  Description of procedure:  The patient was taken to the operating room and general anesthesia was induced.  The patient was placed in the dorsal lithotomy position, prepped and draped in the usual sterile fashion, and preoperative antibiotics were administered. A preoperative time-out was performed.   A 24 French continuous-flow resectoscope sheath with visual obturator was lubricated and passed per urethra.  The resectoscope was advanced into the bladder with findings as described above.  The [facility] resectoscope with loop was then placed into the sheath and the bladder  mucosa was closely inspected with the tumor as described above.  No other bladder mucosal lesions were identified.  The tumor was then resected down to superficial muscle with bipolar current.  All specimen was removed and sent as bladder tumor.  Cold biopsies were then taken of the tumor base and sent separately.  Hemostasis was obtained with bipolar cautery.  At the completion of procedure hemostasis was adequate.  A 16 French Foley catheter was placed with return of pink-tinged effluent.  After anesthetic reversal patient transported to PACU in stable condition.  2000 mg of intravesical gemcitabine was instilled to the bladder. This was allowed to dwell in the PACU for 1 hour.  This was well-tolerated.  After 1 hour, the catheter was drained and removed.  Plan:  He will be contacted with the pathology report once received and further treatment/follow-up recommendations will be made at that time   Makailah Slavick C. Bernardo Heater,  MD

## 2020-01-13 NOTE — OR Nursing (Signed)
Dr Elenor Quinones in to see pt in postop @ 1235, states ok to d/c foley cath, also advises patient does not need to void prior to discharge.

## 2020-01-14 ENCOUNTER — Encounter: Payer: Self-pay | Admitting: Urology

## 2020-01-14 LAB — SURGICAL PATHOLOGY

## 2020-01-14 NOTE — Anesthesia Postprocedure Evaluation (Signed)
Anesthesia Post Note  Patient: Casey Acevedo  Procedure(s) Performed: TRANSURETHRAL RESECTION OF BLADDER TUMOR WITH gemcitabine (N/A Bladder)  Patient location during evaluation: PACU Anesthesia Type: General Level of consciousness: awake and alert Pain management: pain level controlled Vital Signs Assessment: post-procedure vital signs reviewed and stable Respiratory status: spontaneous breathing, nonlabored ventilation and respiratory function stable Cardiovascular status: blood pressure returned to baseline and stable Postop Assessment: no apparent nausea or vomiting Anesthetic complications: no   No complications documented.   Last Vitals:  Vitals:   01/13/20 1204 01/13/20 1249  BP: 140/66 (!) 145/74  Pulse: 69 77  Resp: 16 16  Temp:  36.6 C  SpO2: 97% 96%    Last Pain:  Vitals:   01/13/20 1249  TempSrc: Temporal  PainSc: 0-No pain                 Brett Canales Arthella Headings

## 2020-01-16 ENCOUNTER — Telehealth: Payer: Self-pay | Admitting: Urology

## 2020-01-16 NOTE — Telephone Encounter (Signed)
I contacted Casey Acevedo to discuss his bladder pathology results.  He had intermittent hematuria earlier this week which has resolved.  He did have an episode of severe lower abdominal discomfort last night not associated with hematuria or urination and states it felt like a "big gas ball".  He is asymptomatic today.  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  Muscularis negative for malignancy.  We discussed the pathology report and the recommendations for surveillance of low-grade urothelial carcinoma.  I recommended a follow-up surveillance cystoscopy in 3 months.

## 2020-01-19 NOTE — Telephone Encounter (Signed)
App made patient is aware  Michelle 

## 2020-02-12 ENCOUNTER — Ambulatory Visit: Payer: Medicare Other | Admitting: Cardiothoracic Surgery

## 2020-04-21 ENCOUNTER — Other Ambulatory Visit: Payer: Self-pay

## 2020-04-21 ENCOUNTER — Ambulatory Visit (INDEPENDENT_AMBULATORY_CARE_PROVIDER_SITE_OTHER): Payer: Medicare Other | Admitting: Urology

## 2020-04-21 ENCOUNTER — Encounter: Payer: Self-pay | Admitting: Urology

## 2020-04-21 VITALS — BP 148/83 | HR 78 | Ht 69.0 in | Wt 170.0 lb

## 2020-04-21 DIAGNOSIS — C679 Malignant neoplasm of bladder, unspecified: Secondary | ICD-10-CM | POA: Diagnosis not present

## 2020-04-21 LAB — URINALYSIS, COMPLETE
Bilirubin, UA: NEGATIVE
Glucose, UA: NEGATIVE
Ketones, UA: NEGATIVE
Leukocytes,UA: NEGATIVE
Nitrite, UA: NEGATIVE
Protein,UA: NEGATIVE
Specific Gravity, UA: >1.03 — ABNORMAL HIGH (ref 1.005–1.030)
Urobilinogen, Ur: 0.2 mg/dL (ref 0.2–1.0)
pH, UA: 5 (ref 5.0–7.5)

## 2020-04-21 LAB — MICROSCOPIC EXAMINATION: Bacteria, UA: NONE SEEN

## 2020-04-21 NOTE — Progress Notes (Signed)
   04/21/20  CC:  Chief Complaint  Patient presents with  . Cysto    Urologic history: 1.  Ta urothelial carcinoma bladder -TURBT 01/13/2020 for 3 cm papillary tumor -Pathology noninvasive low-grade urothelial carcinoma -Received post resection gemcitabine   HPI: Casey Acevedo has no complaints today and presents for follow-up surveillance cystoscopy.  Denies gross hematuria  Blood pressure (!) 148/83, pulse 78, height 5\' 9"  (1.753 m), weight 170 lb (77.1 kg). NED. A&Ox3.   No respiratory distress   Abd soft, NT, ND Normal phallus with bilateral descended testicles  Cystoscopy Procedure Note  Patient identification was confirmed, informed consent was obtained, and patient was prepped using Betadine solution.  Lidocaine jelly was administered per urethral meatus.     Pre-Procedure: - Inspection reveals a normal caliber ureteral meatus.  Procedure: The flexible cystoscope was introduced without difficulty - No urethral strictures/lesions are present. - Moderate lateral lobe enlargement prostate  - Mild elevation bladder neck - Bilateral ureteral orifices identified - Bladder mucosa  reveals no ulcers, tumors, or lesions - No bladder stones - No trabeculation  Retroflexion shows no tumor   Post-Procedure: - Patient tolerated the procedure well  Assessment/ Plan:  No recurrent tumor identified  Follow-up surveillance cystoscopy 9 months   Abbie Sons, MD

## 2020-08-24 ENCOUNTER — Other Ambulatory Visit: Payer: Self-pay | Admitting: Orthopedic Surgery

## 2020-08-24 DIAGNOSIS — S22080A Wedge compression fracture of T11-T12 vertebra, initial encounter for closed fracture: Secondary | ICD-10-CM

## 2020-08-24 DIAGNOSIS — S22070A Wedge compression fracture of T9-T10 vertebra, initial encounter for closed fracture: Secondary | ICD-10-CM

## 2020-08-25 ENCOUNTER — Other Ambulatory Visit: Payer: Self-pay

## 2020-08-25 ENCOUNTER — Ambulatory Visit
Admission: RE | Admit: 2020-08-25 | Discharge: 2020-08-25 | Disposition: A | Discharge status: Home / Self Care | Payer: Medicare Other | Source: Ambulatory Visit | Attending: Orthopedic Surgery | Admitting: Orthopedic Surgery

## 2020-08-25 DIAGNOSIS — S22080A Wedge compression fracture of T11-T12 vertebra, initial encounter for closed fracture: Secondary | ICD-10-CM | POA: Insufficient documentation

## 2020-08-25 DIAGNOSIS — S22070A Wedge compression fracture of T9-T10 vertebra, initial encounter for closed fracture: Secondary | ICD-10-CM

## 2020-08-25 IMAGING — MR MR THORACIC SPINE W/O CM
5 of 6 series · 30 of 48 positions shown · non-contrast
Comparison: CT chest/abdomen/pelvis [DATE].

CLINICAL DATA: Closed wedge compression fracture of T12 vertebra,
initial encounter. Closed wedge compression fracture of T10
vertebra, initial encounter. Additional history provided by scanning
technologist: Patient reports back pain at bowel line which radiates
around to anterior left abdomen for 1 week.

EXAM:
MRI THORACIC SPINE WITHOUT CONTRAST
TECHNIQUE: Multiplanar, multisequence MR imaging of the thoracic spine was
performed. No intravenous contrast was administered.

[Series 16: T1 · sagittal · 5.0mm · 1.41mm/px · 4 of 9 slices shown (1 of 2)]
[im 1/9]
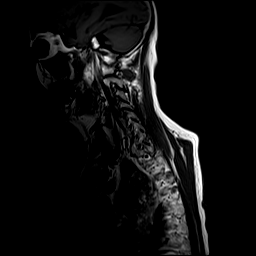
[im 3/9]
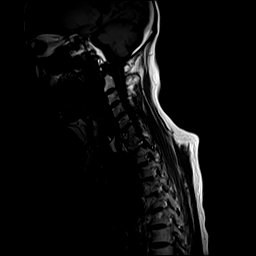
[im 6/9]
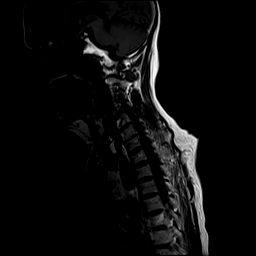
[im 9/9]
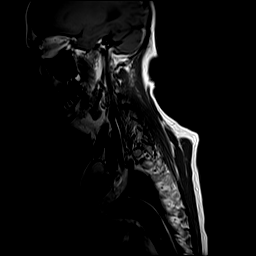

[Series 17: T2 · sagittal · 3.0mm · 1.06mm/px · 6 of 19 slices shown (1 of 2)]
[im 1/19]
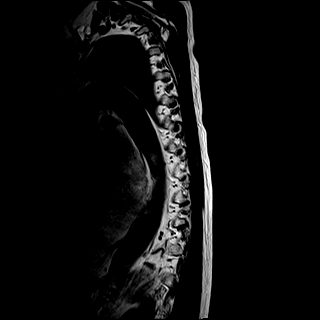
[im 4/19]
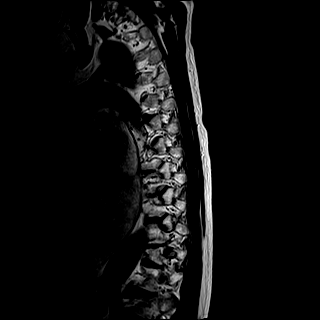
[im 8/19]
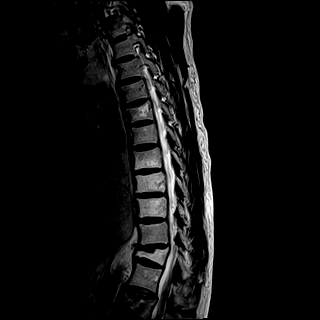
[im 11/19]
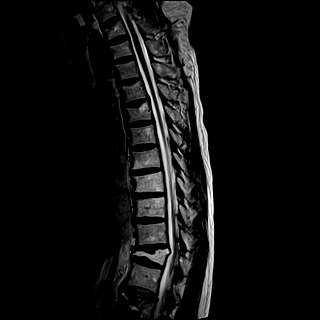
[im 15/19]
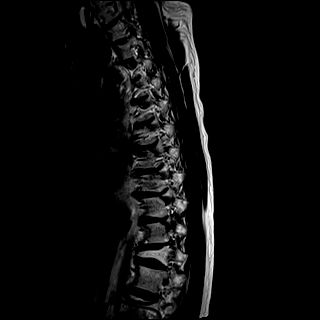
[im 19/19]
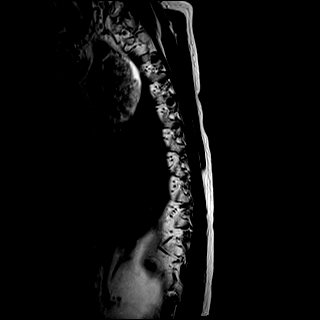

[Series 18: T1 · sagittal · 3.0mm · 1.06mm/px · 6 of 19 slices shown (2 of 2)]
[im 1/19]
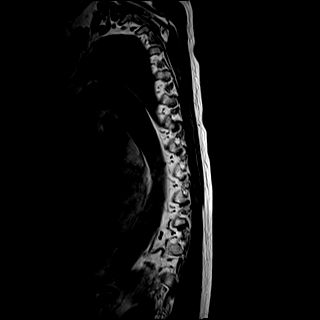
[im 4/19]
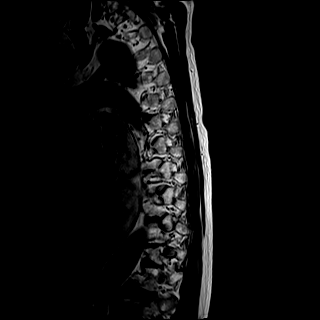
[im 8/19]
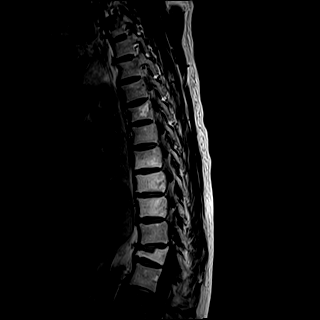
[im 11/19]
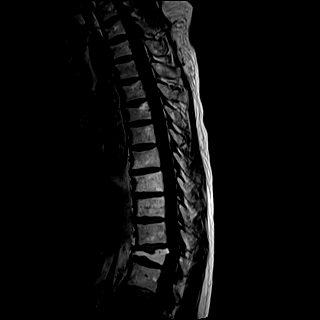
[im 15/19]
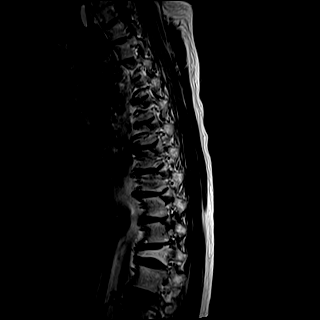
[im 19/19]
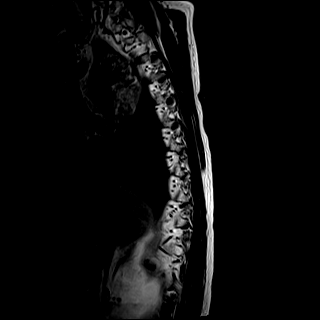

[Series 19: STIR · sagittal · 3.0mm · 0.53mm/px · 5 of 19 slices shown]
[im 1/19]
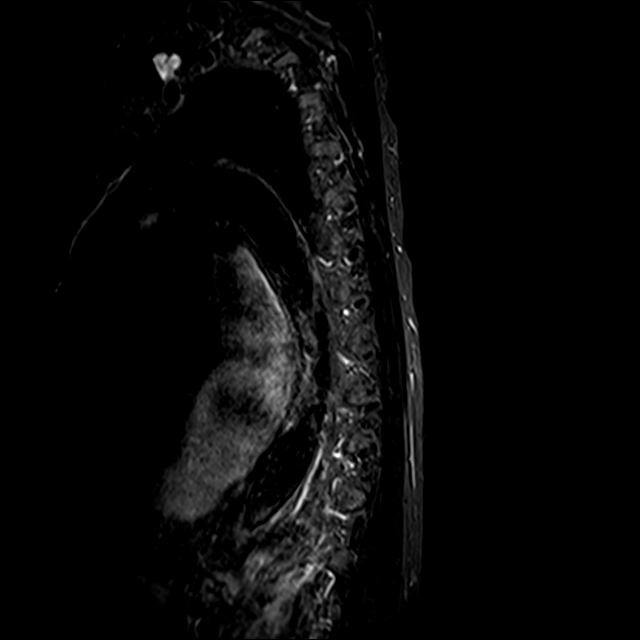
[im 4/19]
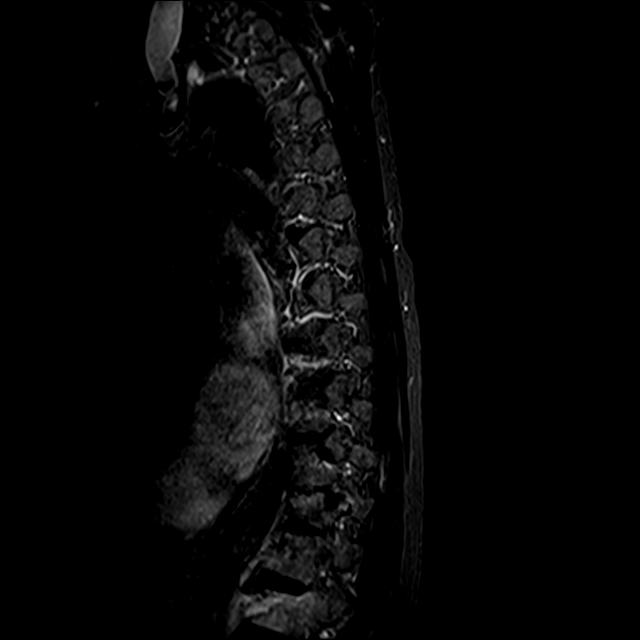
[im 8/19]
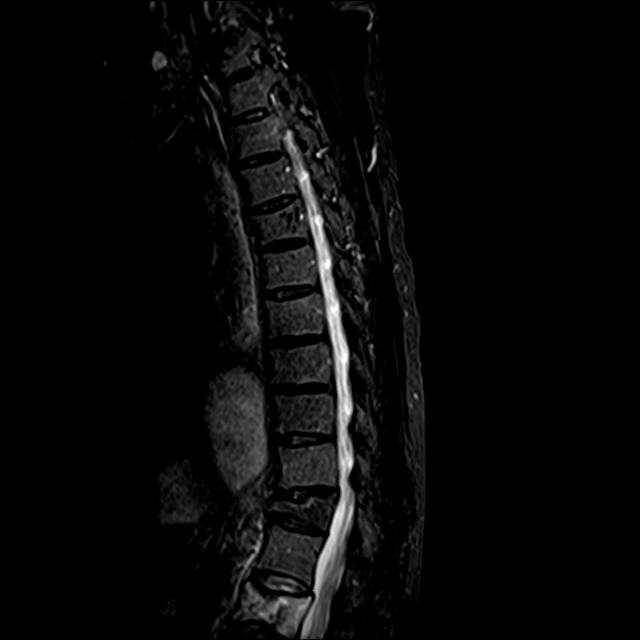
[im 11/19]
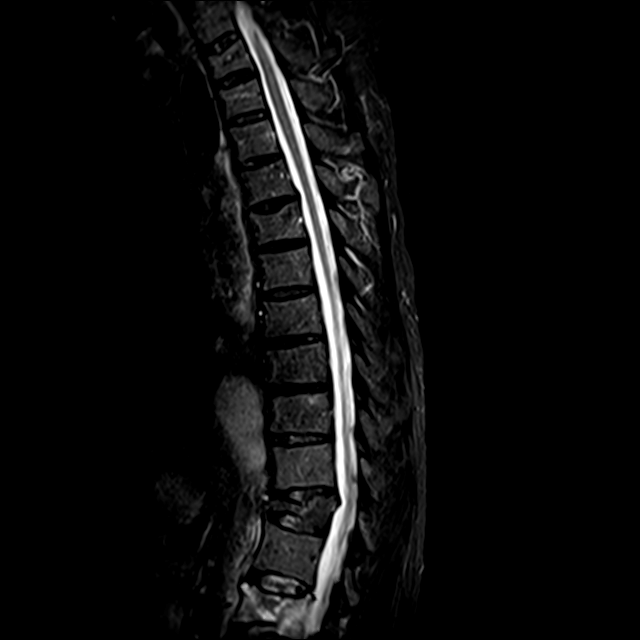
[im 15/19]
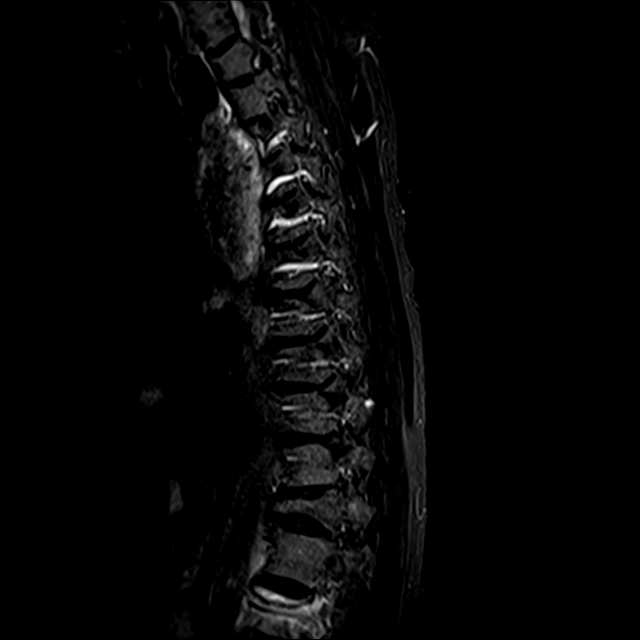

[Series 20: T2 · axial · 4.0mm · 0.59mm/px · z∈[-364,-136]mm · 9 of 40 slices shown (2 of 2)]
[im 1/40]
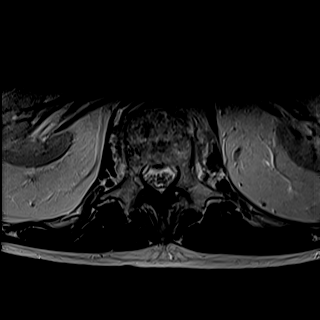
[im 7/40]
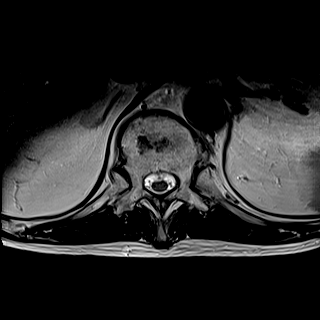
[im 14/40]
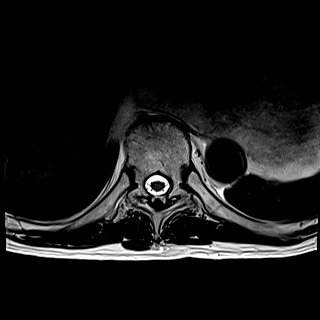
[im 17/40]
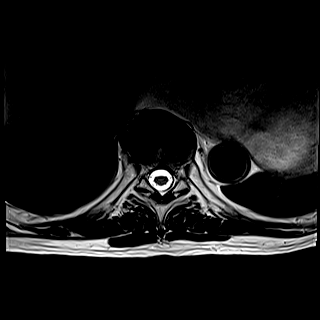
[im 20/40]
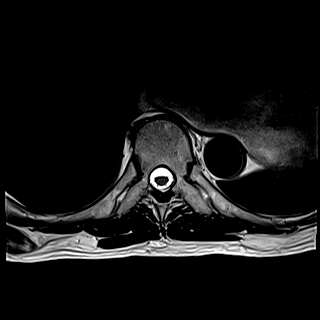
[im 23/40]
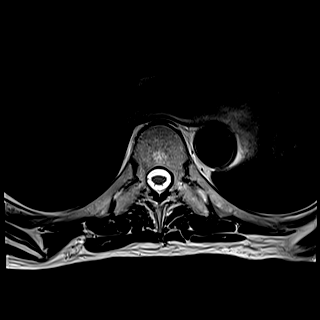
[im 27/40]
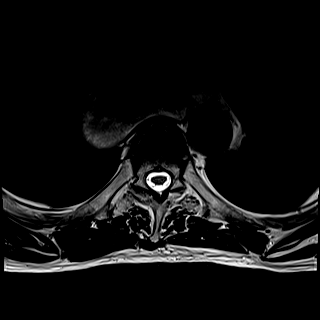
[im 33/40]
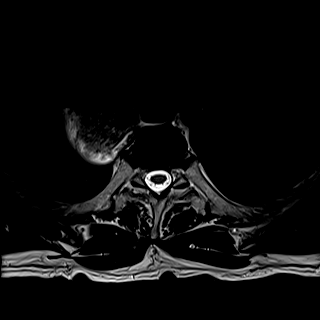
[im 40/40]
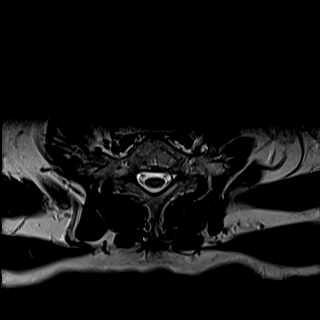

[30 of 48 positions shown; findings below may reference images not displayed]

FINDINGS: Alignment: 4 mm bony retropulsion at the level of the T11 superior
endplate. No significant bony retropulsion at the remaining levels.
No significant spondylolisthesis.

Vertebrae: Unchanged mild chronic superior endplate compression
deformities at T1, T4 and T5. Redemonstrated severe chronic T11
vertebral compression fracture with unchanged 80% height loss. There
is mild edema within the T11 vertebral body, likely degenerative. No
significant marrow edema or focal suspicious osseous lesion
elsewhere within the thoracic spine. Focal fat versus hemangiomas
within the T5 and T6 vertebrae.

Cord:  No spinal cord signal abnormality is identified.

Paraspinal and other soft tissues: Redemonstrated postoperative
sequela from prior esophagectomy and gastric pull-through. No
abnormality identified within included portions of the thorax or
upper abdomen/retroperitoneum. Paraspinal soft tissues within normal
limits.

Disc levels:

No more than mild disc degeneration at any level. There are small
multilevel disc bulges. Additionally, small central disc protrusions
are present at T4-T5 and T5-T6. No significant degenerative spinal
canal stenosis. Bony retropulsion at the level of the T11 superior
endplate results in mild spinal canal narrowing (without spinal cord
mass effect). No compressive foraminal stenosis.
IMPRESSION: Redemonstrated chronic T11 vertebral compression fracture with
unchanged 80% height loss. 4 mm bony retropulsion at the level of
the T11 superior endplate with resultant mild spinal canal narrowing
(without spinal cord mass effect).

Unchanged mild chronic superior endplate compression deformities at
T1, T4 and T5.

Mild thoracic spondylosis without degenerative spinal canal stenosis
or compressive foraminal narrowing.

## 2020-08-25 IMAGING — MR MR LUMBAR SPINE W/O CM
4 of 5 series · 30 of 48 positions shown · non-contrast
Comparison: CT chest/abdomen/pelvis [DATE]. report from lumbar
spine radiographs [DATE] (images unavailable).

CLINICAL DATA: Closed wedge compression fracture of T12 vertebra,
initial encounter. Additional provided: Patient reports back pain at
belt line which radiates around to anterior left abdomen for 1 week.

EXAM:
MRI LUMBAR SPINE WITHOUT CONTRAST
TECHNIQUE: Multiplanar, multisequence MR imaging of the lumbar spine was
performed. No intravenous contrast was administered.

[Series 16: T2 · sagittal · 4.0mm · 0.81mm/px · 6 of 17 slices shown (1 of 2)]
[im 1/17]
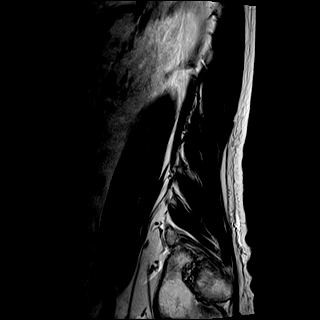
[im 4/17]
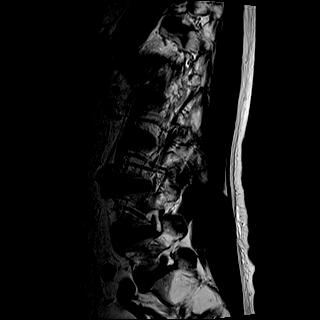
[im 7/17]
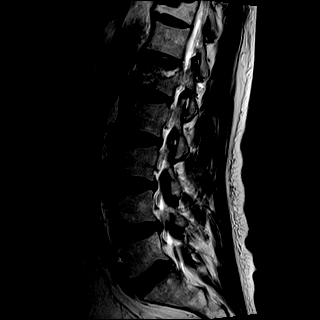
[im 10/17]
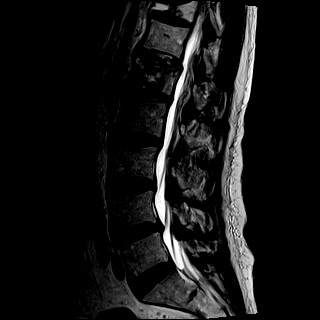
[im 13/17]
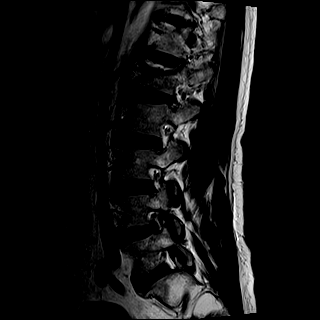
[im 17/17]
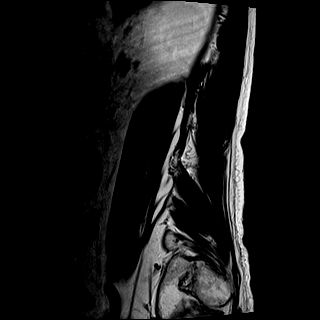

[Series 17: T1 · sagittal · 4.0mm · 0.81mm/px · 6 of 17 slices shown (1 of 2)]
[im 1/17]
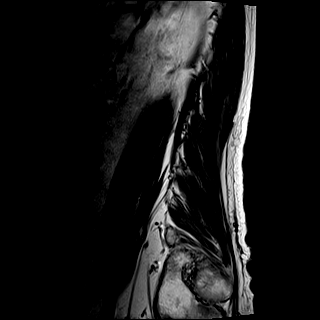
[im 4/17]
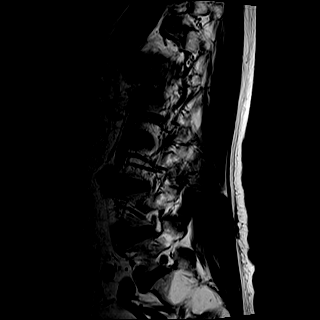
[im 7/17]
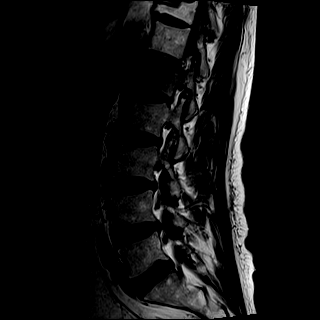
[im 10/17]
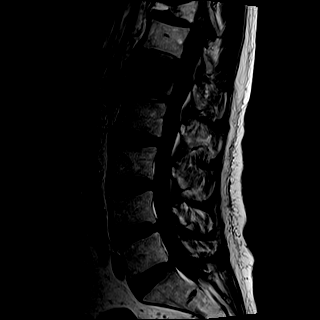
[im 13/17]
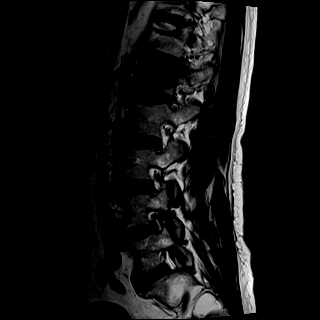
[im 17/17]
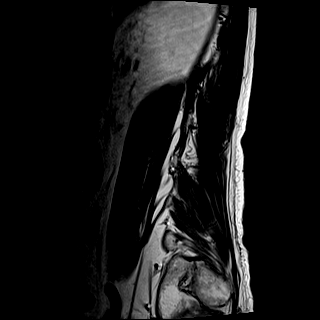

[Series 19: T2 · axial · 4.0mm · 0.78mm/px · z∈[-591,-366]mm · 9 of 40 slices shown (2 of 2)]
[im 1/40]
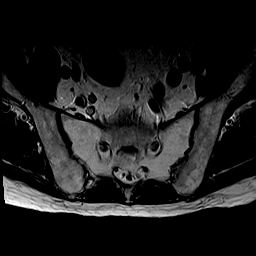
[im 6/40]
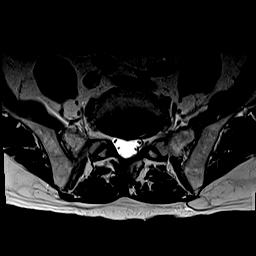
[im 12/40]
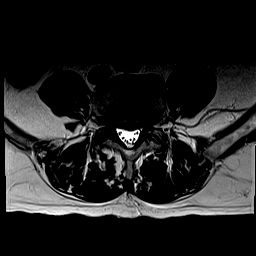
[im 17/40]
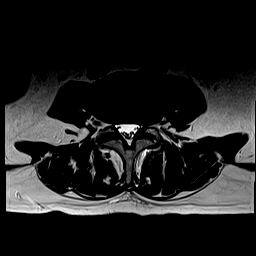
[im 20/40]
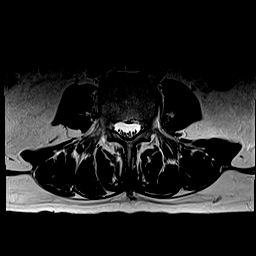
[im 23/40]
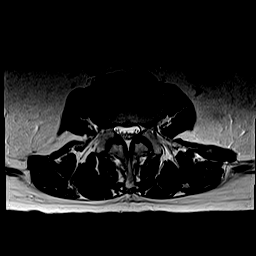
[im 28/40]
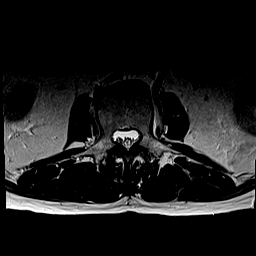
[im 34/40]
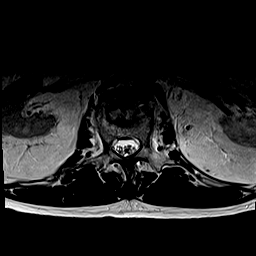
[im 40/40]
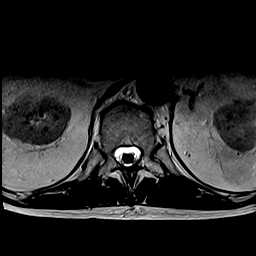

[Series 20: T1 · axial · 4.0mm · 0.39mm/px · z∈[-591,-366]mm · 9 of 40 slices shown (2 of 2)]
[im 1/40]
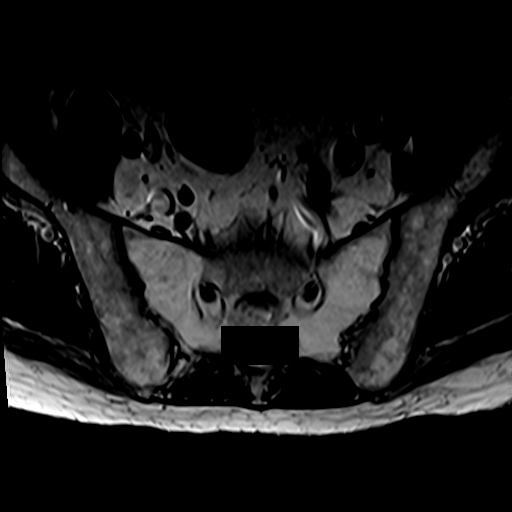
[im 6/40]
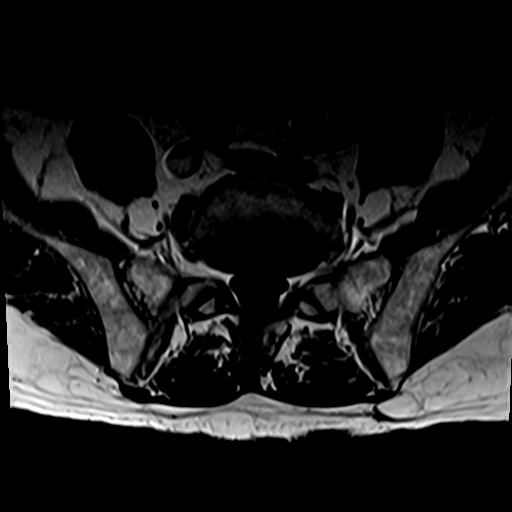
[im 12/40]
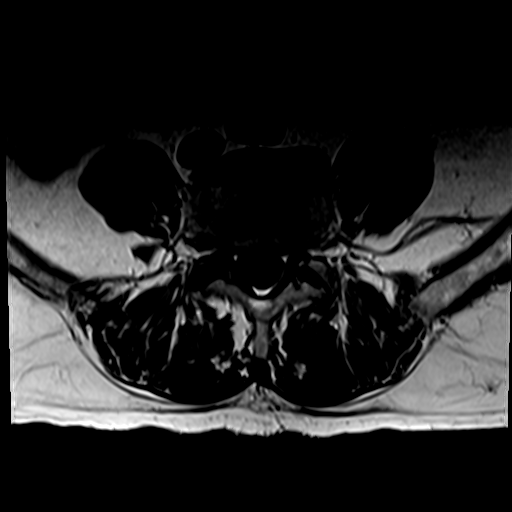
[im 17/40]
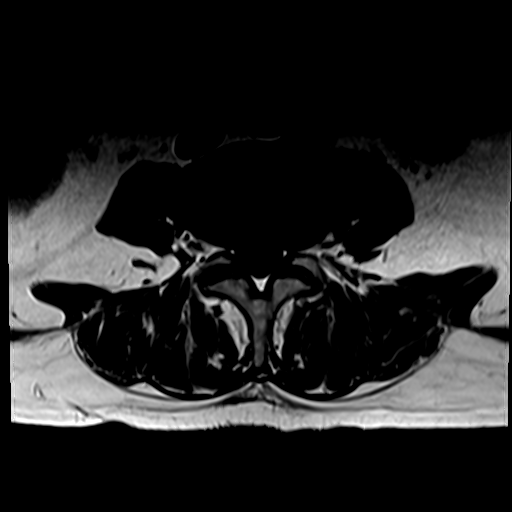
[im 20/40]
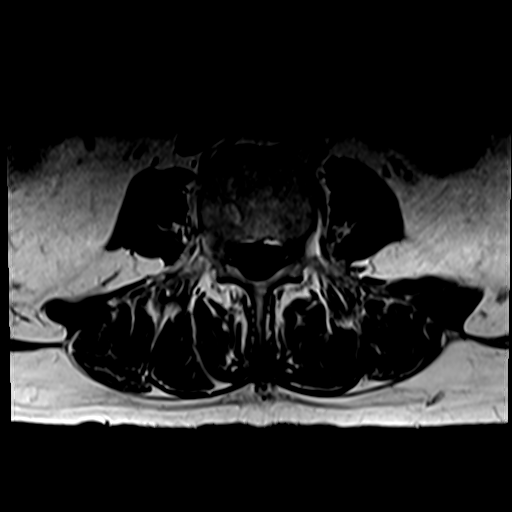
[im 23/40]
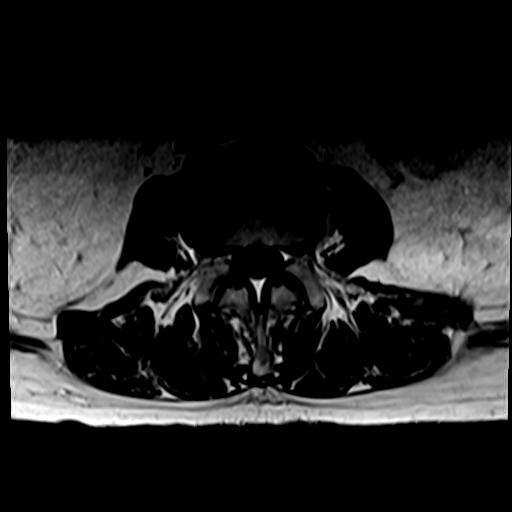
[im 28/40]
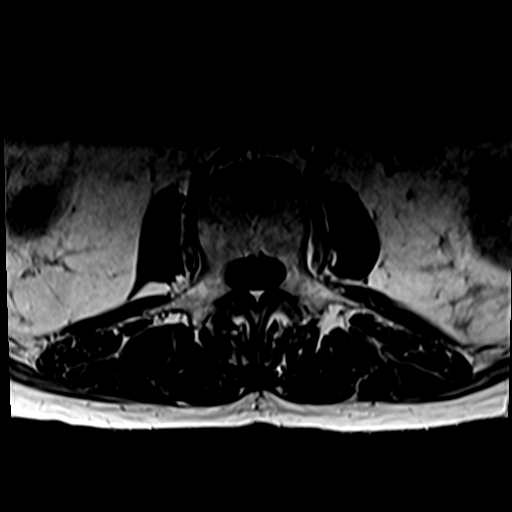
[im 34/40]
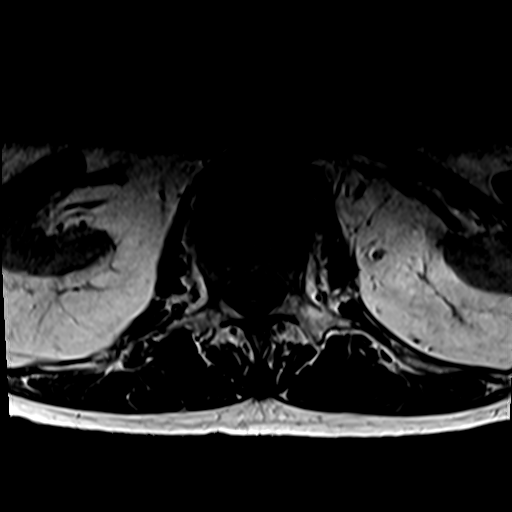
[im 40/40]
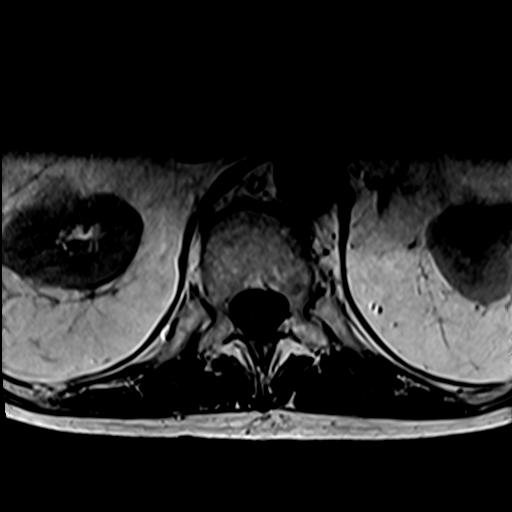

[30 of 48 positions shown; findings below may reference images not displayed]

FINDINGS: Segmentation: 5 lumbar vertebrae. The caudal most well-formed
intervertebral disc space is designated L5-S1.

Alignment: 3 mm bony retropulsion at the level of the L1 superior
endplate.

Vertebrae: L1 vertebral compression fracture (30% height loss
centrally). Vertically oriented and obliquely oriented fracture
planes are present within the L1 vertebral body. Edema signal is
present throughout the majority of the L1 vertebral body and this
fracture is likely acute. Edema also extends into the right L1
pedicle. L1 vertebral body signal is somewhat heterogeneous.
Incompletely imaged severe chronic T11 vertebral compression
fracture. Vertebral body height is otherwise maintained at the
imaged levels. No significant marrow edema elsewhere. L5 vertebral
body hemangioma. Multilevel ventrolateral osteophytes.

Conus medullaris and cauda equina: Conus extends to the L2 level. No
signal abnormality within the visualized distal spinal cord.

Paraspinal and other soft tissues: No abnormality identified within
included portions of the abdomen/retroperitoneum. Paraspinal soft
tissues within normal limits.

Disc levels:

No more than mild disc degeneration at any level.

T11-T12: Imaged sagittally. Facet arthrosis. No significant disc
herniation or stenosis.

T12-L1: 3 mm bony retropulsion at the level of the L1 superior
endplate. Associated small disc bulge. Mild facet
arthrosis/ligamentum flavum hypertrophy. Bony retropulsion results
in mild relative spinal canal narrowing without spinal cord mass
effect. No significant foraminal stenosis.

L1-L2: Small disc bulge. Mild facet arthrosis/ligamentum flavum
hypertrophy. Mild relative spinal canal narrowing without nerve root
impingement. No significant foraminal stenosis.

L2-L3: Central posterior annular fissure. Small disc bulge. Mild
facet arthrosis. Mild relative spinal canal narrowing without nerve
root impingement. No significant foraminal stenosis.

L3-L4: Disc bulge slightly asymmetric to the left. Endplate spurring
along the left aspect of the disc space. Mild facet arthrosis and
ligamentum flavum hypertrophy. No significant spinal canal stenosis.
Mild bilateral neural foraminal narrowing (greater on the left).

L4-L5: Disc bulge. Superimposed broad-based central disc protrusion
at site of posterior annular fissure. Facet arthrosis (mild right,
mild/moderate left). Left greater than right ligamentum flavum
hypertrophy. The disc protrusion contributes to mild bilateral
subarticular narrowing with slight crowding of the descending L5
nerve roots (series 19, image 30). Mild relative narrowing of the
central canal. Moderate bilateral neural foraminal narrowing.

L5-S1: Left subarticular/foraminal posterior annular fissure. Small
disc bulge. Mild facet arthrosis. No significant spinal canal or
foraminal stenosis.

Impression #1 will be called to the ordering clinician or
representative by the Radiologist Assistant, and communication
documented in the PACS or [REDACTED].
IMPRESSION: L1 vertebral compression fracture (30% height loss). There are
obliquely and vertically oriented fracture planes within the
vertebra. There is edema signal throughout the L1 vertebra and
extending into the right L1 pedicle, and this fracture is likely
acute. The L1 vertebral body signal is somewhat heterogeneous and
short interval 3-4 month MRI follow-up is recommended to exclude an
underlying lesion (i.e. osseous metastatic disease).

3 mm bony retropulsion at the level of the L1 superior endplate.
This results in mild relative spinal canal narrowing without spinal
cord mass effect.

Lumbar spondylosis, as outlined, with no more than mild degenerative
spinal canal stenosis. Most notably, at L4-L5 a broad-based central
disc protrusion contributes to multifactorial mild bilateral
subarticular narrowing with crowding of the descending L5 nerve
roots. Multilevel neural foraminal narrowing, greatest bilaterally
at L4-L5 (moderate).

## 2020-08-26 ENCOUNTER — Other Ambulatory Visit: Payer: Self-pay | Admitting: Orthopedic Surgery

## 2020-08-27 ENCOUNTER — Encounter
Admission: RE | Admit: 2020-08-27 | Discharge: 2020-08-27 | Disposition: A | Discharge status: Home / Self Care | Payer: Medicare Other | Source: Ambulatory Visit | Attending: Orthopedic Surgery | Admitting: Orthopedic Surgery

## 2020-08-27 ENCOUNTER — Encounter: Payer: Self-pay | Admitting: Orthopedic Surgery

## 2020-08-27 ENCOUNTER — Other Ambulatory Visit
Admission: RE | Admit: 2020-08-27 | Discharge: 2020-08-27 | Disposition: A | Discharge status: Home / Self Care | Payer: Medicare Other | Source: Ambulatory Visit | Attending: Orthopedic Surgery | Admitting: Orthopedic Surgery

## 2020-08-27 ENCOUNTER — Other Ambulatory Visit: Payer: Self-pay

## 2020-08-27 DIAGNOSIS — Z01818 Encounter for other preprocedural examination: Secondary | ICD-10-CM | POA: Diagnosis present

## 2020-08-27 LAB — CBC
HCT: 47.3 % (ref 39.0–52.0)
Hemoglobin: 16.3 g/dL (ref 13.0–17.0)
MCH: 30.9 pg (ref 26.0–34.0)
MCHC: 34.5 g/dL (ref 30.0–36.0)
MCV: 89.6 fL (ref 80.0–100.0)
Platelets: 304 10*3/uL (ref 150–400)
RBC: 5.28 MIL/uL (ref 4.22–5.81)
RDW: 13.5 % (ref 11.5–15.5)
WBC: 10.9 10*3/uL — ABNORMAL HIGH (ref 4.0–10.5)
nRBC: 0 % (ref 0.0–0.2)

## 2020-08-27 LAB — BASIC METABOLIC PANEL
Anion gap: 8 (ref 5–15)
BUN: 32 mg/dL — ABNORMAL HIGH (ref 8–23)
CO2: 27 mmol/L (ref 22–32)
Calcium: 8.9 mg/dL (ref 8.9–10.3)
Chloride: 100 mmol/L (ref 98–111)
Creatinine, Ser: 1.18 mg/dL (ref 0.61–1.24)
GFR, Estimated: >60 mL/min (ref >60)
Glucose, Bld: 140 mg/dL — ABNORMAL HIGH (ref 70–99)
Potassium: 4.3 mmol/L (ref 3.5–5.1)
Sodium: 135 mmol/L (ref 135–145)

## 2020-08-27 NOTE — Patient Instructions (Signed)
Your procedure is scheduled on:08-31-20 TUESDAY Report to the Registration Desk on the 1st floor of the [facility]-Then proceed to the 2nd floor Surgery Desk in the [facility] To find out your arrival time, please call [phone] between 1PM - 3PM on:08-30-20 MONDAY  REMEMBER: Instructions that are not followed completely may result in serious medical risk, up to and including death; or upon the discretion of your surgeon and anesthesiologist your surgery may need to be rescheduled.  Do not eat food after midnight the night before surgery.  No gum chewing, lozengers or hard candies.  You may however, drink CLEAR liquids up to 2 hours before you are scheduled to arrive for your surgery. Do not drink anything within 2 hours of your scheduled arrival time.  Clear liquids include: - water  - apple juice without pulp - gatorade  - black coffee or tea (Do NOT add milk or creamers to the coffee or tea) Do NOT drink anything that is not on this list.  TAKE THESE MEDICATIONS THE MORNING OF SURGERY WITH A SIP OF WATER: -You may take Tramadol (Ultram) the day of surgery if needed for pain  One week prior to surgery: Stop Anti-inflammatories (NSAIDS) such as Advil, Aleve, Ibuprofen, Motrin, Naproxen, Naprosyn and Aspirin based products such as Excedrin, Goodys Powder, BC Powder.You may however, continue to take Tramadol/Tylenol if needed for pain   Stop ANY OVER THE COUNTER supplements/vitamns NOW (08-27-20) until after surgery.  No Alcohol for 24 hours before or after surgery.  No Smoking including e-cigarettes for 24 hours prior to surgery.  No chewable tobacco products for at least 6 hours prior to surgery.  No nicotine patches on the day of surgery.  Do not use any "recreational" drugs for at least a week prior to your surgery.  Please be advised that the combination of cocaine and anesthesia may have negative outcomes, up to and including death. If you test positive for cocaine, your  surgery will be cancelled.  On the morning of surgery brush your teeth with toothpaste and water, you may rinse your mouth with mouthwash if you wish. Do not swallow any toothpaste or mouthwash.  Do not wear jewelry, make-up, hairpins, clips or nail polish.  Do not wear lotions, powders, or perfumes.   Do not shave body from the neck down 48 hours prior to surgery just in case you cut yourself which could leave a site for infection.  Also, freshly shaved skin may become irritated if using the CHG soap.  Contact lenses, hearing aids and dentures may not be worn into surgery.  Do not bring valuables to the hospital. [facility] is not responsible for any missing/lost belongings or valuables.   Use CHG Soap as directed on instruction sheet.  Notify your doctor if there is any change in your medical condition (cold, fever, infection).  Wear comfortable clothing (specific to your surgery type) to the hospital.  After surgery, you can help prevent lung complications by doing breathing exercises.  Take deep breaths and cough every 1-2 hours. Your doctor may order a device called an Incentive Spirometer to help you take deep breaths. When coughing or sneezing, hold a pillow firmly against your incision with both hands. This is called "splinting." Doing this helps protect your incision. It also decreases belly discomfort.  If you are being admitted to the hospital overnight, leave your suitcase in the car. After surgery it may be brought to your room.  If you are being discharged the  day of surgery, you will not be allowed to drive home. You will need a responsible adult (18 years or older) to drive you home and stay with you that night.   If you are taking public transportation, you will need to have a responsible adult (18 years or older) with you. Please confirm with your physician that it is acceptable to use public transportation.   Please call the [facility] at [phone] if you have any questions about these instructions.  Surgery Visitation Policy:  Patients undergoing a surgery or procedure may have one family member or support person with them as long as that person is not COVID-19 positive or experiencing its symptoms.  That person may remain in the waiting area during the procedure.  Inpatient Visitation:    Visiting hours are 7 a.m. to 8 p.m. Inpatients will be allowed two visitors daily. The visitors may change each day during the patient's stay. No visitors under the age of 66. Any visitor under the age of 36 must be accompanied by an adult. The visitor must pass COVID-19 screenings, use hand sanitizer when entering and exiting the patient's room and wear a mask at all times, including in the patient's room. Patients must also wear a mask when staff or their visitor are in the room. Masking is required regardless of vaccination status.

## 2020-08-28 ENCOUNTER — Emergency Department (HOSPITAL_COMMUNITY): Payer: Medicare Other

## 2020-08-28 ENCOUNTER — Emergency Department (HOSPITAL_COMMUNITY)
Admission: EM | Admit: 2020-08-28 | Discharge: 2020-08-29 | Disposition: A | Discharge status: Home / Self Care | Payer: Medicare Other | Attending: Emergency Medicine | Admitting: Emergency Medicine

## 2020-08-28 ENCOUNTER — Other Ambulatory Visit: Payer: Self-pay

## 2020-08-28 DIAGNOSIS — R110 Nausea: Secondary | ICD-10-CM | POA: Diagnosis not present

## 2020-08-28 DIAGNOSIS — Z5321 Procedure and treatment not carried out due to patient leaving prior to being seen by health care provider: Secondary | ICD-10-CM | POA: Insufficient documentation

## 2020-08-28 DIAGNOSIS — M549 Dorsalgia, unspecified: Secondary | ICD-10-CM | POA: Insufficient documentation

## 2020-08-28 DIAGNOSIS — R109 Unspecified abdominal pain: Secondary | ICD-10-CM | POA: Diagnosis not present

## 2020-08-28 DIAGNOSIS — R0781 Pleurodynia: Secondary | ICD-10-CM | POA: Diagnosis not present

## 2020-08-28 DIAGNOSIS — W182XXA Fall in (into) shower or empty bathtub, initial encounter: Secondary | ICD-10-CM | POA: Insufficient documentation

## 2020-08-28 IMAGING — CR DG ABDOMEN 2V
3 series · 3 of 3 positions shown · non-contrast
Comparison: CT [DATE]

CLINICAL DATA: Fall with right rib pain.

EXAM:
ABDOMEN - 2 VIEW

[w abdomen upright]
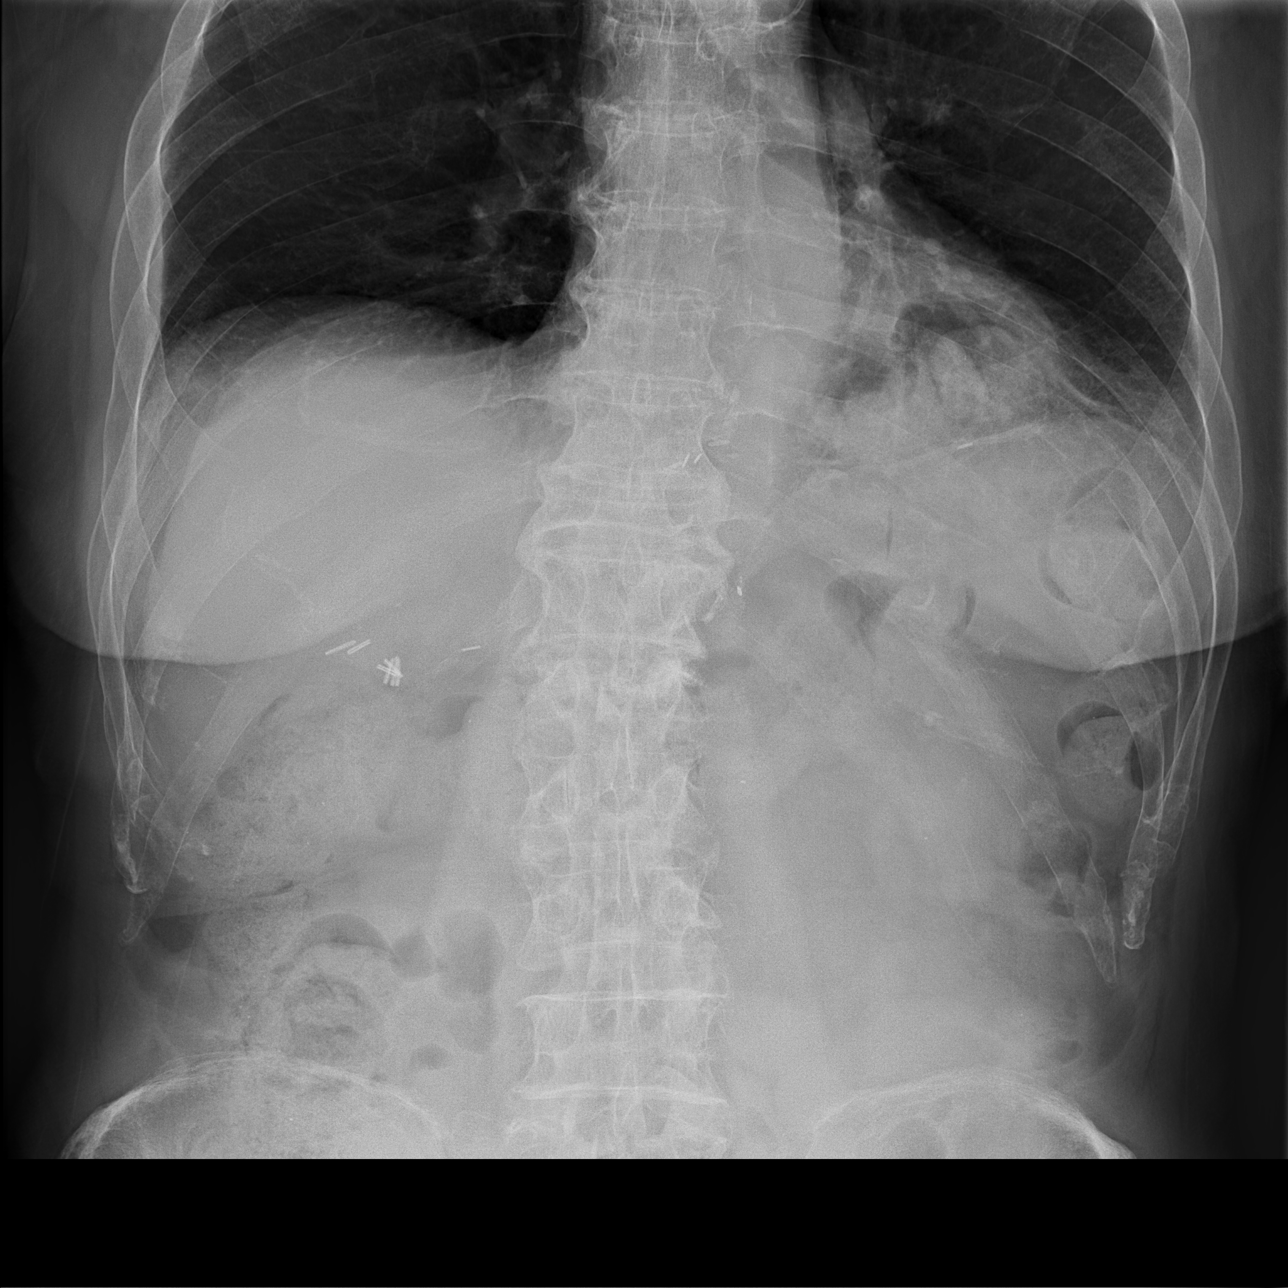

[t abdomen supine (1 of 2)]
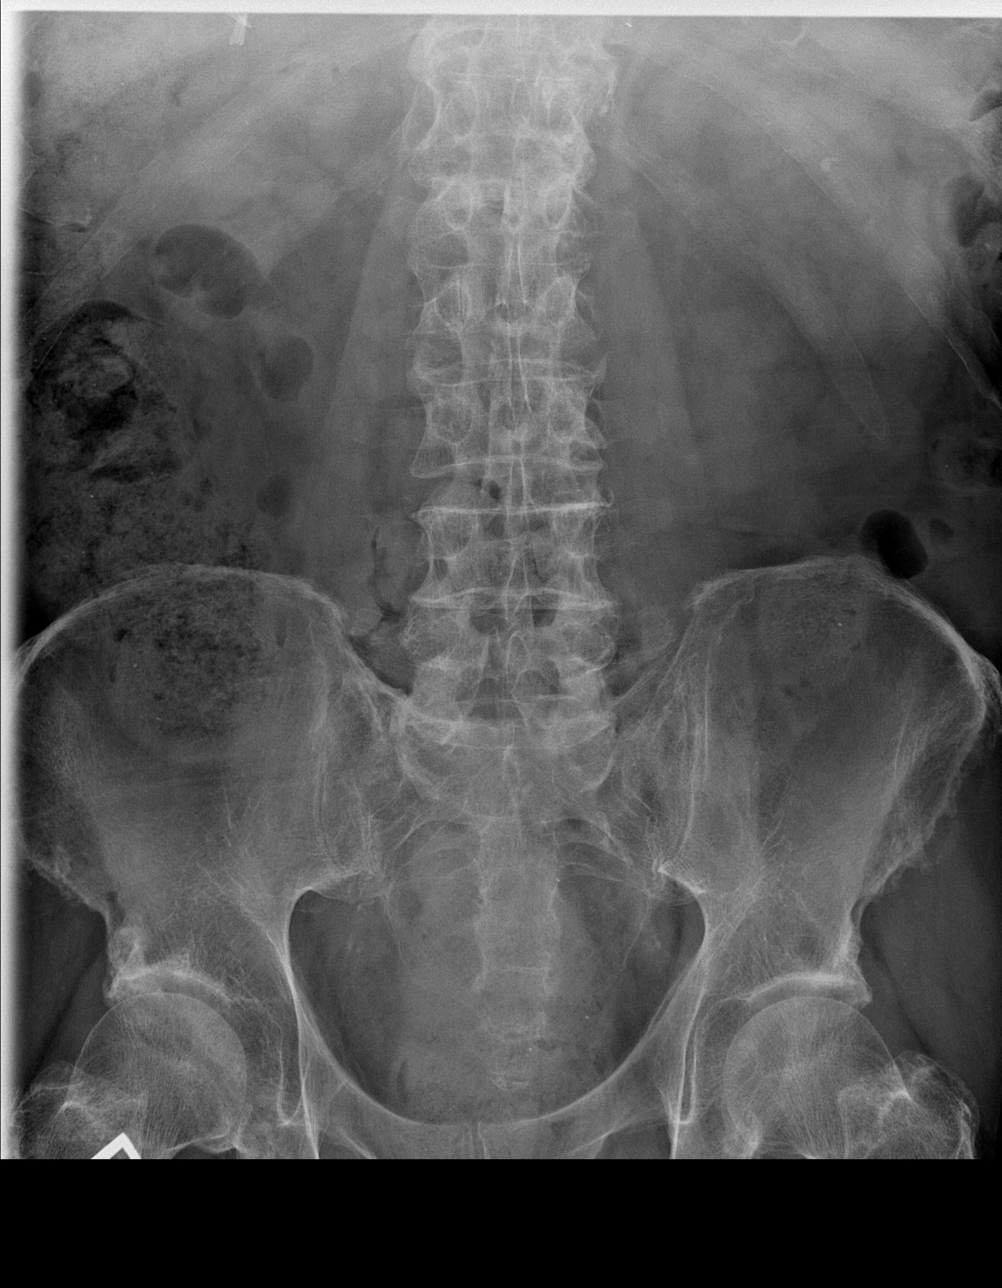

[t abdomen supine (2 of 2)]
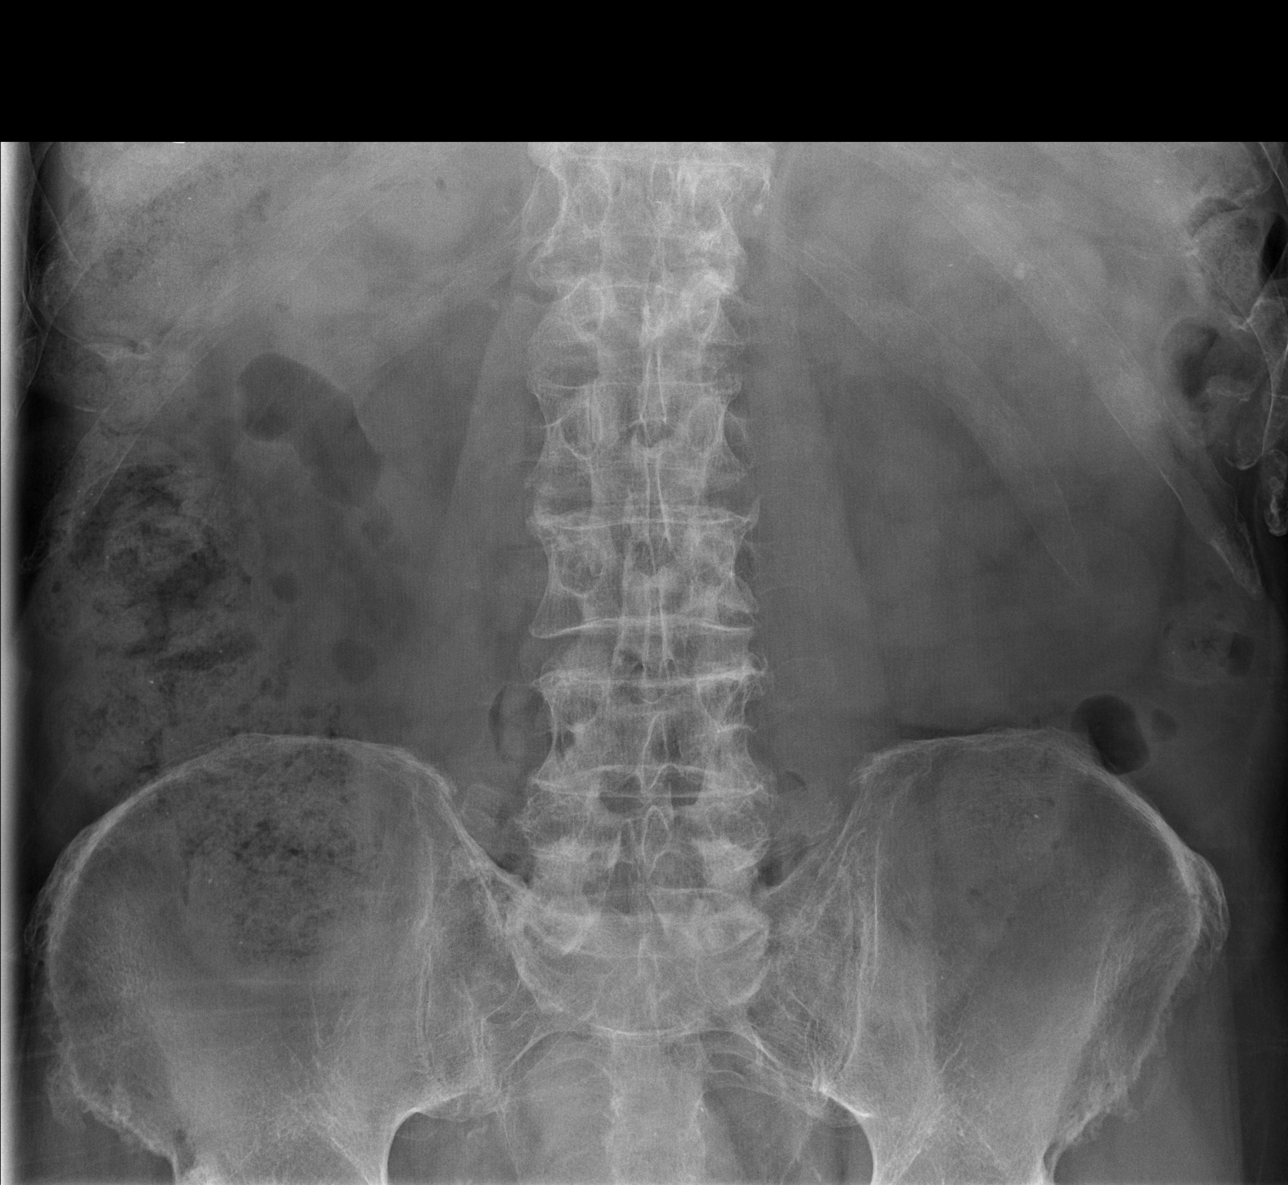

[3 of 3 positions shown; findings below may reference images not displayed]

FINDINGS: No free intra-abdominal air. Enteric sutures at the gastroesophageal
junction, patient is post esophagectomy and gastric pull-through.
Cholecystectomy clips in the right upper quadrant. Moderate colonic
stool burden primarily in the right colon. Occasional foci of high
density throughout colonic contents. No small bowel dilatation. No
evidence of lower right rib fracture of included ribs. Degenerative
change in the spine and both hips. L1 compression fracture was
recently assessed with MRI.
IMPRESSION: 1. No free air or bowel obstruction.
2. Moderate colonic stool burden, primarily in the right colon.
3. Included right lower ribs are intact without evidence of
fracture.

## 2020-08-28 NOTE — ED Provider Notes (Signed)
Emergency Medicine Provider Triage Evaluation Note  Casey Acevedo , a 80 y.o. male  was evaluated in triage.  Pt complains of worsening back pain, scheduled for surgery Tuesday. Pain medication causes pt to sleep all day and not eat. Fell this AM in the bathtub, right lower rib pain.   Review of Systems  Positive: Back pain, rib pain, abdominal pain (from fall today) Negative: Changes in bowel or bladder habits  Physical Exam  BP 127/81 (BP Location: Left Arm)   Pulse 98   Temp (!) 97.3 F (36.3 C) (Oral)   Resp 18   SpO2 94%  Gen:   Awake, no distress   Resp:  Normal effort  MSK:   Moves extremities without difficulty  Other:  Right abdominal tenderenss  Medical Decision Making  Medically screening exam initiated at 6:15 PM.  Appropriate orders placed.  [name] was informed that the remainder of the evaluation will be completed by another provider, this initial triage assessment does not replace that evaluation, and the importance of remaining in the ED until their evaluation is complete.     Tacy Learn, PA-C 08/28/20 1817    Luna Fuse, MD 08/29/20 [phone]

## 2020-08-28 NOTE — ED Triage Notes (Signed)
Pt has not eaten in 2 days, says he is too nauseas. He reports pain from a compressed disc is causing him to be nauseas.

## 2020-08-28 NOTE — ED Notes (Signed)
Pt states that they are unwilling to wait any longer and will be heading to another hospital

## 2020-08-29 ENCOUNTER — Emergency Department
Admission: EM | Admit: 2020-08-29 | Discharge: 2020-08-29 | Disposition: A | Discharge status: Home / Self Care | Payer: Medicare Other | Attending: Emergency Medicine | Admitting: Emergency Medicine

## 2020-08-29 ENCOUNTER — Emergency Department: Payer: Medicare Other

## 2020-08-29 ENCOUNTER — Encounter: Payer: Self-pay | Admitting: Emergency Medicine

## 2020-08-29 ENCOUNTER — Other Ambulatory Visit: Payer: Self-pay

## 2020-08-29 DIAGNOSIS — S299XXA Unspecified injury of thorax, initial encounter: Secondary | ICD-10-CM

## 2020-08-29 DIAGNOSIS — R5383 Other fatigue: Secondary | ICD-10-CM | POA: Insufficient documentation

## 2020-08-29 DIAGNOSIS — K219 Gastro-esophageal reflux disease without esophagitis: Secondary | ICD-10-CM | POA: Diagnosis not present

## 2020-08-29 DIAGNOSIS — R109 Unspecified abdominal pain: Secondary | ICD-10-CM | POA: Diagnosis not present

## 2020-08-29 DIAGNOSIS — X58XXXA Exposure to other specified factors, initial encounter: Secondary | ICD-10-CM | POA: Insufficient documentation

## 2020-08-29 DIAGNOSIS — R531 Weakness: Secondary | ICD-10-CM | POA: Diagnosis not present

## 2020-08-29 DIAGNOSIS — R42 Dizziness and giddiness: Secondary | ICD-10-CM | POA: Insufficient documentation

## 2020-08-29 DIAGNOSIS — E860 Dehydration: Secondary | ICD-10-CM | POA: Diagnosis not present

## 2020-08-29 DIAGNOSIS — Z8501 Personal history of malignant neoplasm of esophagus: Secondary | ICD-10-CM | POA: Insufficient documentation

## 2020-08-29 DIAGNOSIS — S29001A Unspecified injury of muscle and tendon of front wall of thorax, initial encounter: Secondary | ICD-10-CM | POA: Diagnosis not present

## 2020-08-29 LAB — BASIC METABOLIC PANEL
Anion gap: 10 (ref 5–15)
BUN: 27 mg/dL — ABNORMAL HIGH (ref 8–23)
CO2: 20 mmol/L — ABNORMAL LOW (ref 22–32)
Calcium: 8.9 mg/dL (ref 8.9–10.3)
Chloride: 101 mmol/L (ref 98–111)
Creatinine, Ser: 1.05 mg/dL (ref 0.61–1.24)
GFR, Estimated: >60 mL/min (ref >60)
Glucose, Bld: 141 mg/dL — ABNORMAL HIGH (ref 70–99)
Potassium: 4.4 mmol/L (ref 3.5–5.1)
Sodium: 131 mmol/L — ABNORMAL LOW (ref 135–145)

## 2020-08-29 LAB — URINALYSIS, COMPLETE (UACMP) WITH MICROSCOPIC
Bacteria, UA: NONE SEEN
Bilirubin Urine: NEGATIVE
Glucose, UA: NEGATIVE mg/dL
Hgb urine dipstick: NEGATIVE
Ketones, ur: 20 mg/dL — AB
Leukocytes,Ua: NEGATIVE
Nitrite: NEGATIVE
Protein, ur: NEGATIVE mg/dL
Specific Gravity, Urine: >1.046 — ABNORMAL HIGH (ref 1.005–1.030)
Squamous Epithelial / HPF: NONE SEEN (ref 0–5)
pH: 6 (ref 5.0–8.0)

## 2020-08-29 LAB — CBC
HCT: 45.9 % (ref 39.0–52.0)
Hemoglobin: 16 g/dL (ref 13.0–17.0)
MCH: 31.1 pg (ref 26.0–34.0)
MCHC: 34.9 g/dL (ref 30.0–36.0)
MCV: 89.1 fL (ref 80.0–100.0)
Platelets: 311 10*3/uL (ref 150–400)
RBC: 5.15 MIL/uL (ref 4.22–5.81)
RDW: 13 % (ref 11.5–15.5)
WBC: 18 10*3/uL — ABNORMAL HIGH (ref 4.0–10.5)
nRBC: 0 % (ref 0.0–0.2)

## 2020-08-29 LAB — LIPASE, BLOOD: Lipase: 26 U/L (ref 11–51)

## 2020-08-29 LAB — TROPONIN I (HIGH SENSITIVITY): Troponin I (High Sensitivity): 6 ng/L (ref <18)

## 2020-08-29 IMAGING — CT CT HEAD W/O CM
3 series · 16 of 47 positions shown, 19 images · non-contrast
Comparison: Prior head CT [DATE].

CLINICAL DATA: Head trauma, minor.

EXAM:
CT HEAD WITHOUT CONTRAST
TECHNIQUE: Contiguous axial images were obtained from the base of the skull
through the vertex without intravenous contrast.

[Series 2: head wo · axial · 0.42mm/px · z∈[-25,+100]mm · 10 of 30 slices shown, 13 images]
[im 3/30  brain]
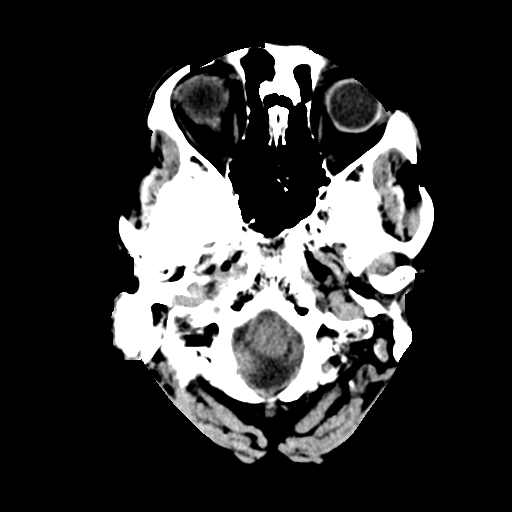
[im 3/30  bone]
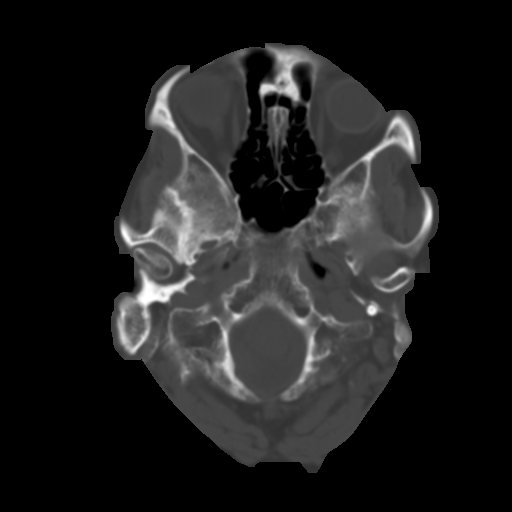
[im 6/30  brain]
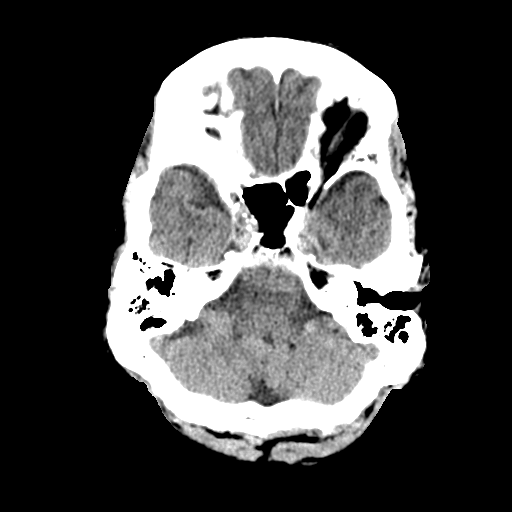
[im 9/30  brain]
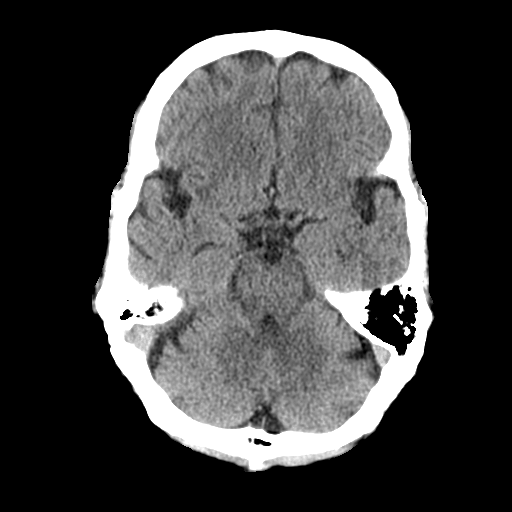
[im 11/30  brain]
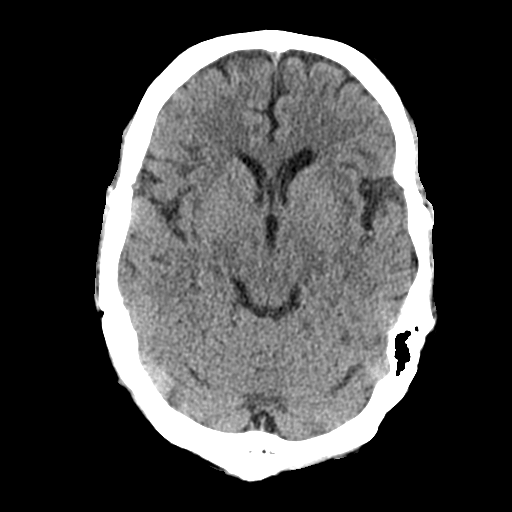
[im 14/30  brain]
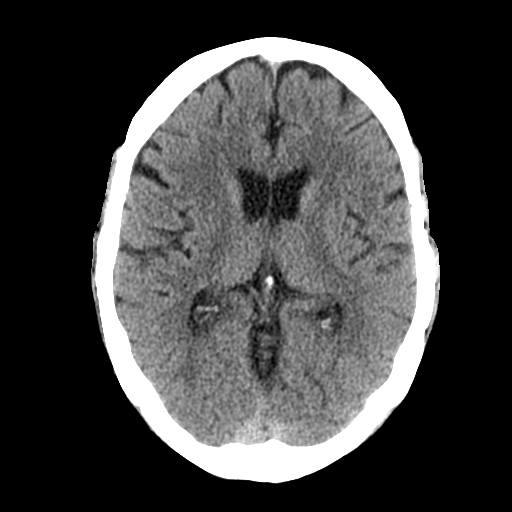
[im 14/30  bone]
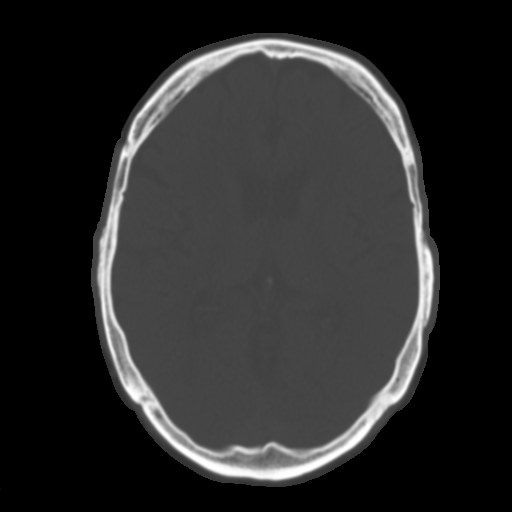
[im 17/30  brain]
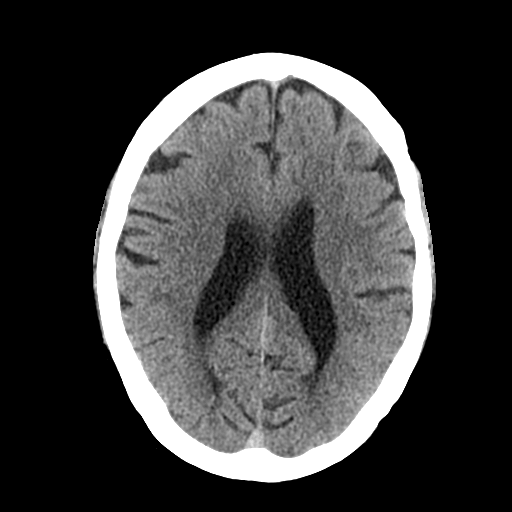
[im 20/30  brain]
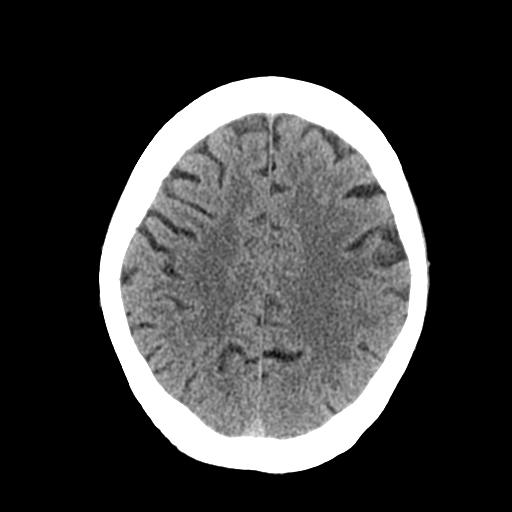
[im 23/30  brain]
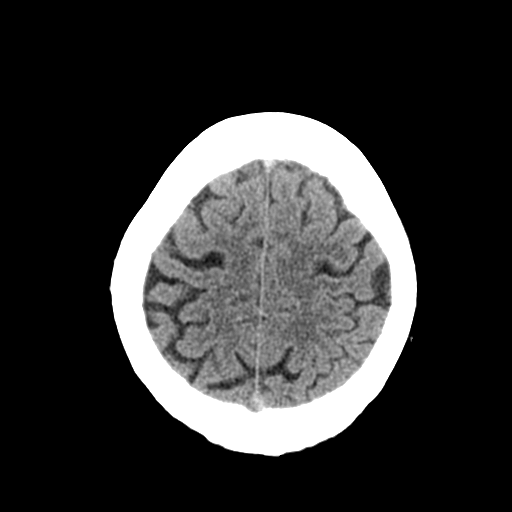
[im 25/30  brain]
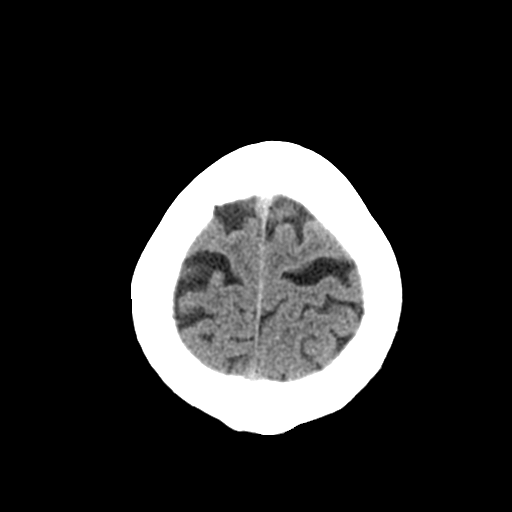
[im 25/30  bone]
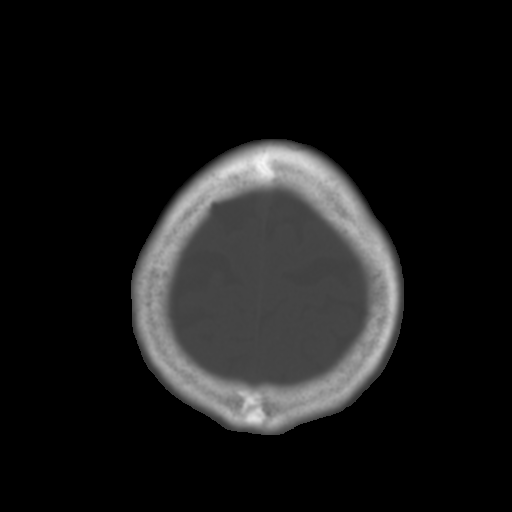
[im 28/30  brain]
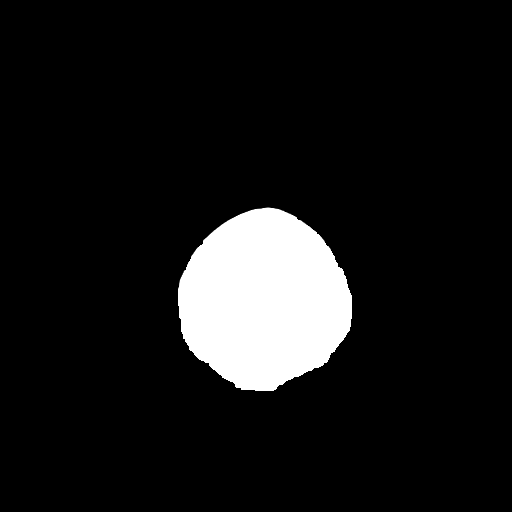

[Series 4: coronal soft tissue · coronal · 0.33mm/px · 3 of 66 slices shown]
[im 22/66  brain]
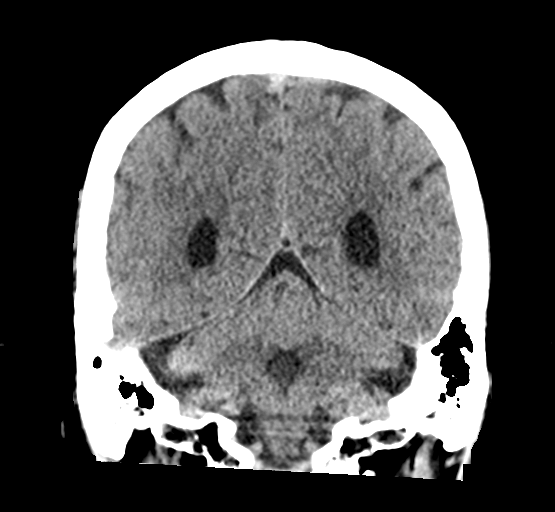
[im 29/66  brain]
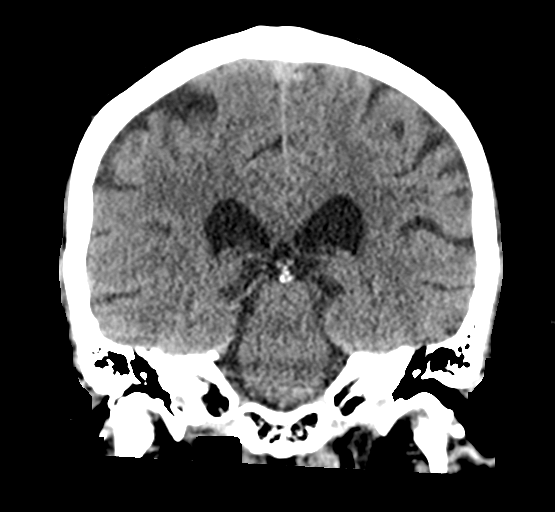
[im 37/66  brain]
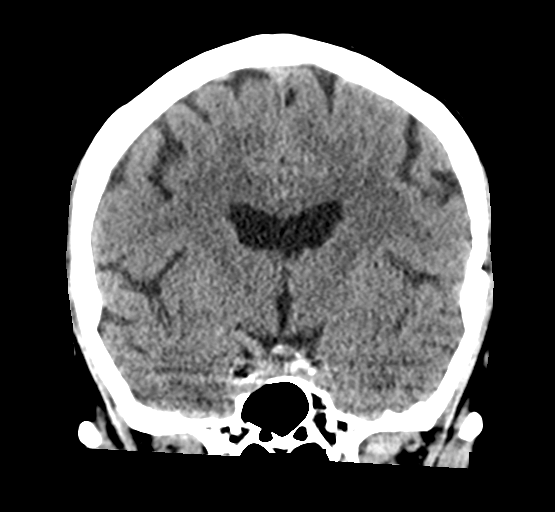

[Series 5: sagittal soft tissue · sagittal · 0.33mm/px · 3 of 53 slices shown]
[im 18/53  brain]
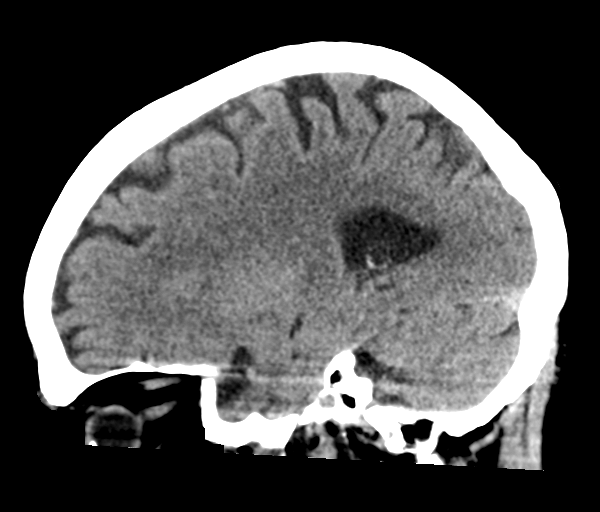
[im 27/53  brain]
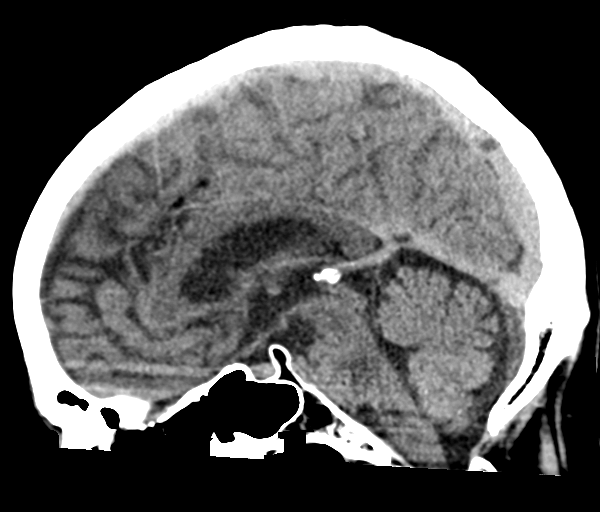
[im 35/53  brain]
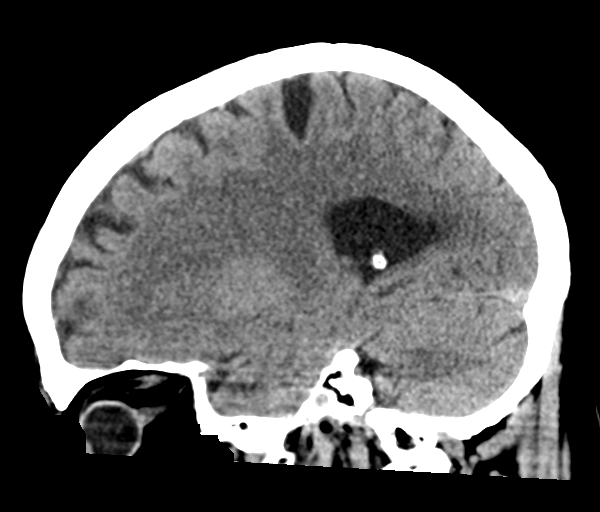

[16 of 47 positions shown; findings below may reference images not displayed]

FINDINGS: Brain:

Mild generalized cerebral atrophy.

There is no acute intracranial hemorrhage.

No demarcated cortical infarct.

No extra-axial fluid collection.

No evidence of intracranial mass.

No midline shift.

Vascular: No hyperdense vessel.  Atherosclerotic calcifications.

Skull: Normal. Negative for fracture or focal lesion.

Sinuses/Orbits: Visualized orbits show no acute finding. No
significant paranasal sinus disease at the imaged levels.
IMPRESSION: No evidence of acute intracranial abnormality.

Mild generalized cerebral atrophy.

## 2020-08-29 IMAGING — CT CT CHEST-ABD-PELV W/ CM
3 of 5 series · 15 of 36 positions shown, 17 images · IV contrast (omnipaque)
Comparison: [DATE]

CLINICAL DATA: Abdominal trauma.

EXAM:
CT CHEST, ABDOMEN, AND PELVIS WITH CONTRAST
TECHNIQUE: Multidetector CT imaging of the chest, abdomen and pelvis was
performed following the standard protocol during bolus
administration of intravenous contrast.
CONTRAST:  75mL OMNIPAQUE IOHEXOL 300 MG/ML  SOLN

[Series 2: cap with · axial · 0.76mm/px · z∈[-743,-233]mm · 9 of 128 slices shown, 11 images]
[im 13/128  mediastinal]
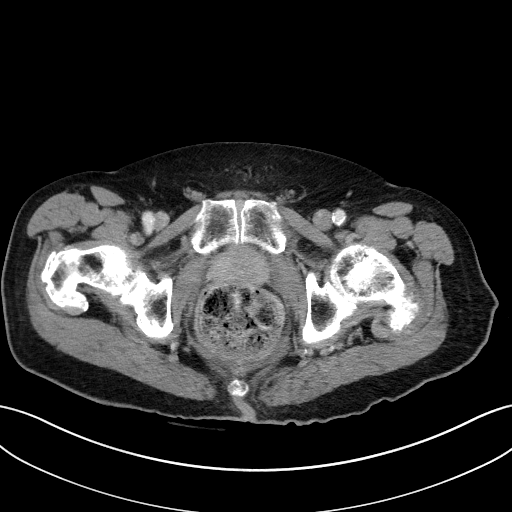
[im 13/128  bone]
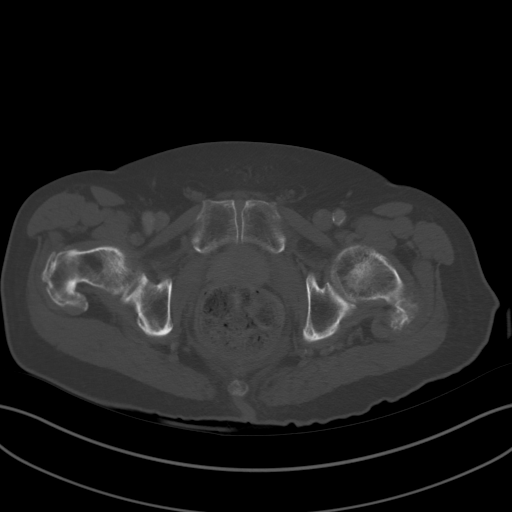
[im 26/128  mediastinal]
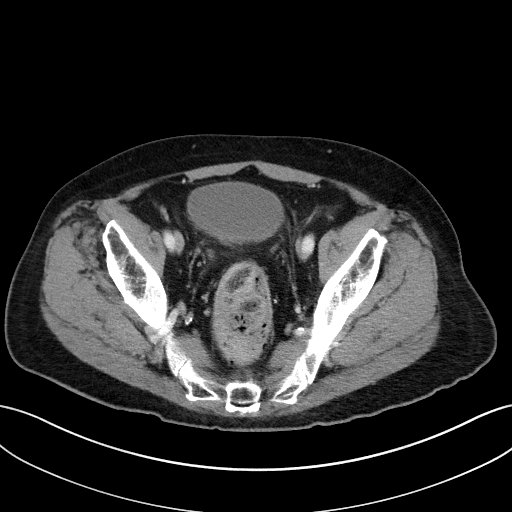
[im 39/128  mediastinal]
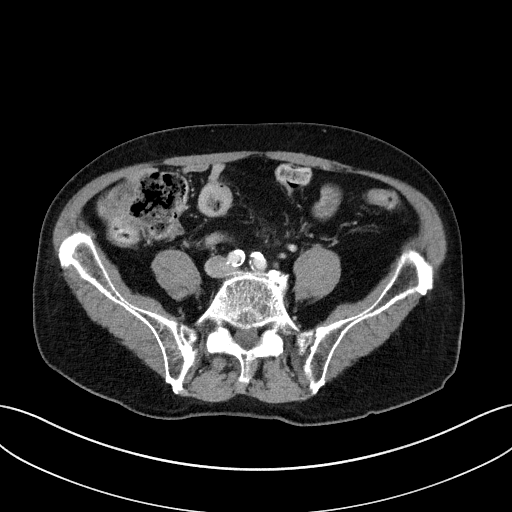
[im 51/128  mediastinal]
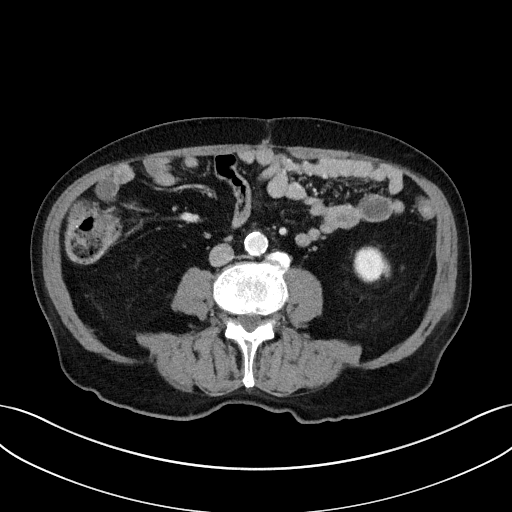
[im 64/128  mediastinal]
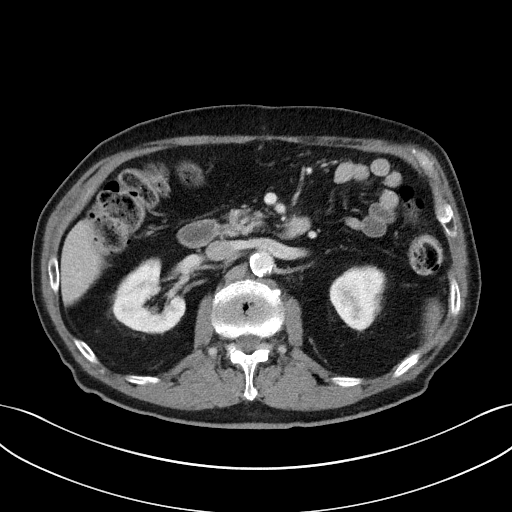
[im 77/128  mediastinal]
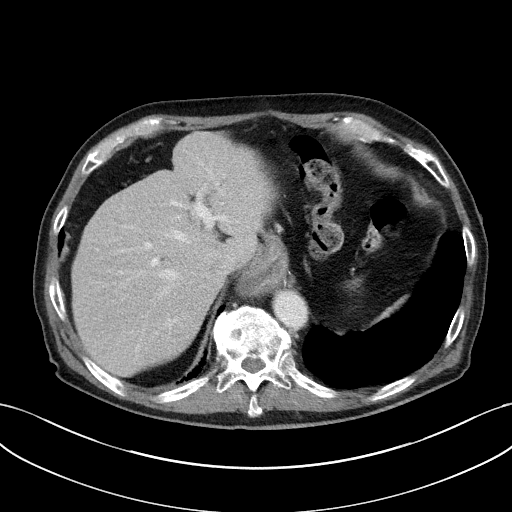
[im 89/128  mediastinal]
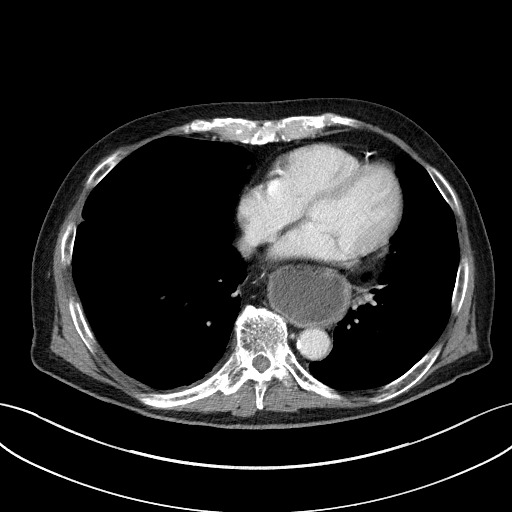
[im 102/128  mediastinal]
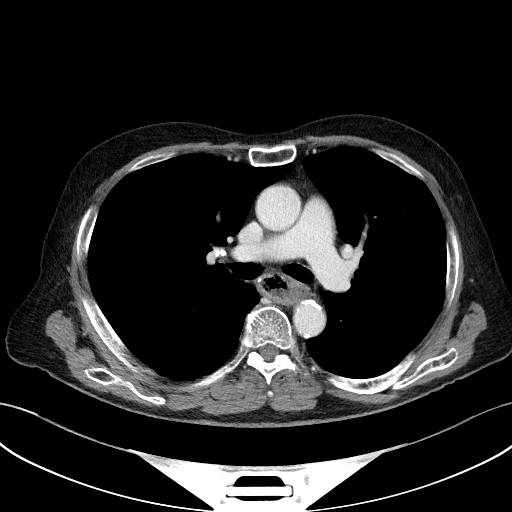
[im 115/128  mediastinal]
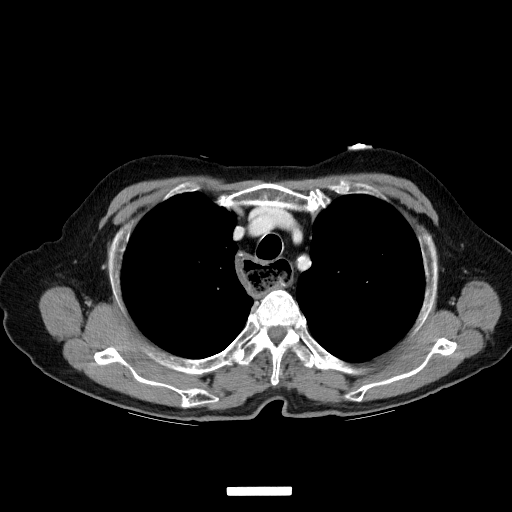
[im 115/128  bone]
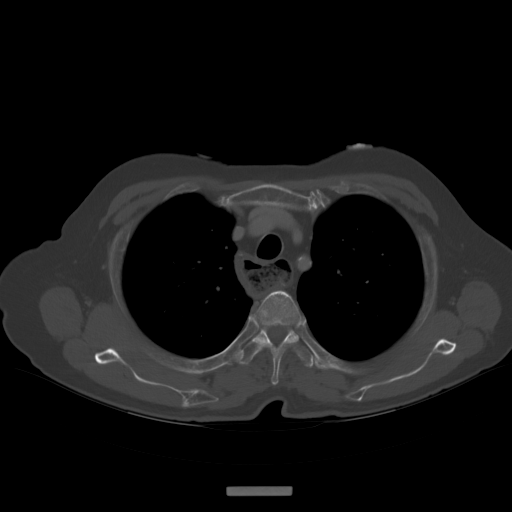

[Series 4: lung · axial · 0.76mm/px · z∈[-456,-384]mm · 3 of 157 slices shown]
[im 13/157  bone]
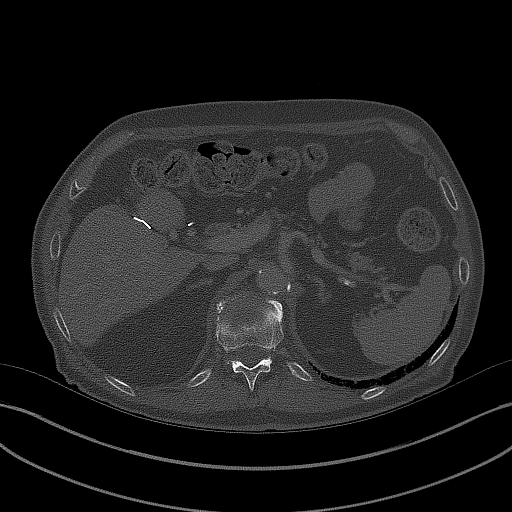
[im 37/157  bone]
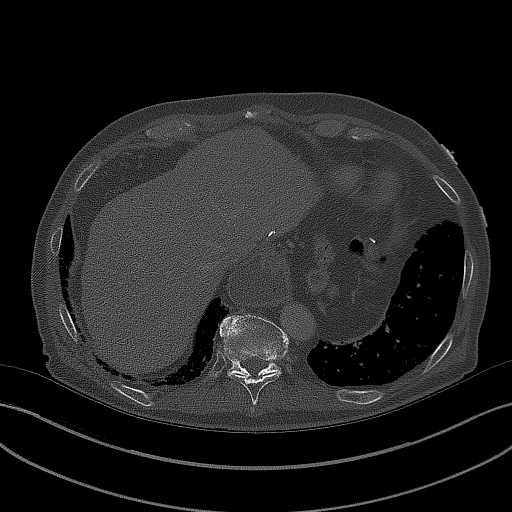
[im 49/157  bone]
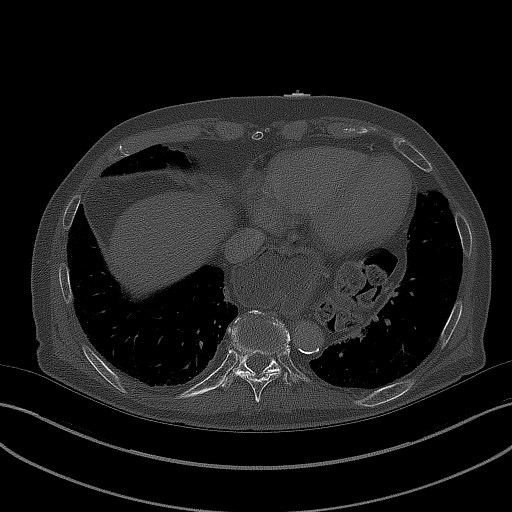

[Series 5: coronals · coronal · 0.94mm/px · 3 of 130 slices shown]
[im 26/130  mediastinal]
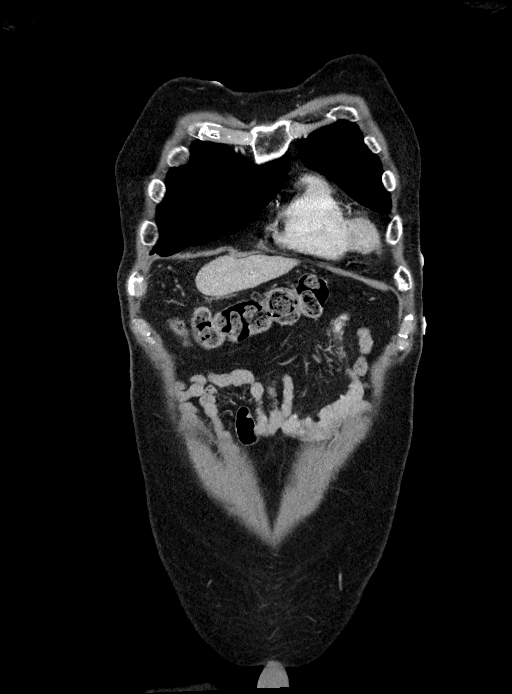
[im 52/130  mediastinal]
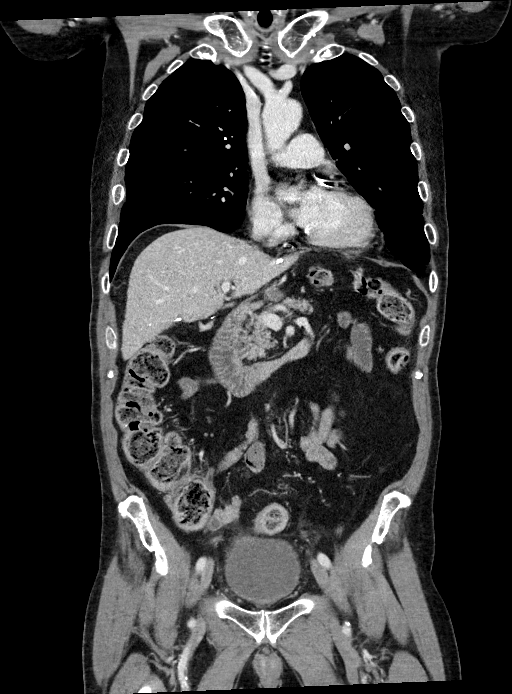
[im 78/130  mediastinal]
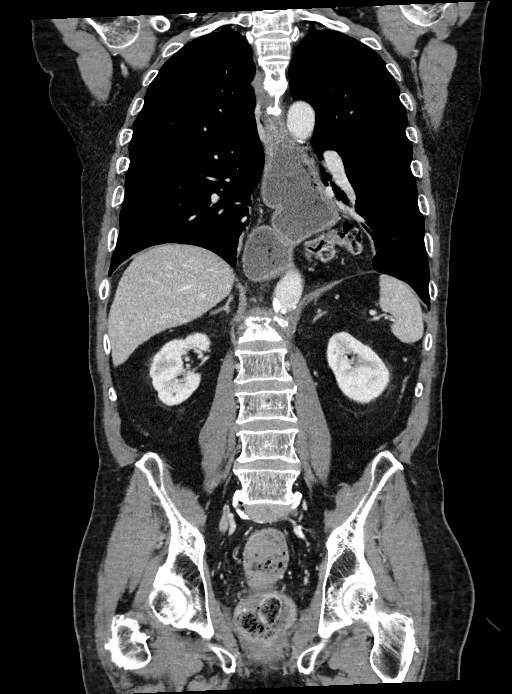

[15 of 36 positions shown; findings below may reference images not displayed]

FINDINGS: CT CHEST FINDINGS

Cardiovascular: No significant vascular findings. Normal heart size.
No pericardial effusion. Calcific atherosclerotic disease of the
coronary arteries and aorta.

Mediastinum/Nodes: No enlarged mediastinal, hilar, or axillary lymph
nodes. Thyroid gland, and trachea demonstrate no significant
findings. Post esophagectomy and gastric pull-through.

Lungs/Pleura: Lungs are clear. No pleural effusion or pneumothorax.

Musculoskeletal: No chest wall mass or suspicious bone lesions
identified.

CT ABDOMEN PELVIS FINDINGS

Hepatobiliary: No hepatic injury or perihepatic hematoma. Post
cholecystectomy. Stable left hepatic cyst.

Pancreas: Unremarkable. No pancreatic ductal dilatation or
surrounding inflammatory changes.

Spleen: Scattered splenic granulomas.

Adrenals/Urinary Tract: Normal adrenal glands. Bilateral
nonobstructive renal calculi measuring up to 4 mm. Subcentimeter
hypoattenuated left renal lesion, too small to be accurately
characterized. Normal urinary bladder.

Stomach/Bowel: Stomach is within normal limits. No evidence of bowel
wall thickening, distention, or inflammatory changes.

Vascular/Lymphatic: Aortic atherosclerosis. No enlarged abdominal or
pelvic lymph nodes.

Reproductive: Mild enlargement of the prostate gland.

Other: No abdominal wall hernia or abnormality. No abdominopelvic
ascites.

Musculoskeletal: Triplane fracture of L1 vertebral body with 3 mm
retropulsion, stable from [DATE] accounting for cross
modality comparison. Chronic severe compression fracture of T11
vertebral body. No new fractures are seen.
IMPRESSION: 1. No evidence of acute traumatic injury to the chest, abdomen or
pelvis.
2. Triplane fracture of L1 vertebral body with 3 mm retropulsion,
stable from [DATE] accounting for cross modality comparison.
3. Chronic severe compression fracture of T11 vertebral body.
4. Bilateral nonobstructive renal calculi.
5. Mild enlargement of the prostate gland. Please correlate to serum
PSA values.
6. Aortic atherosclerosis.

Aortic Atherosclerosis ([CD]-[CD]).

## 2020-08-29 MED ORDER — DOCUSATE SODIUM 100 MG PO CAPS: 100.0000 mg | ORAL_CAPSULE | Freq: Every day | ORAL | 0 refills | Status: AC

## 2020-08-29 MED ORDER — LACTATED RINGERS IV BOLUS
1000.0000 mL | Freq: Once | INTRAVENOUS | Status: AC
  Administered 2020-08-29: 1000 mL via INTRAVENOUS

## 2020-08-29 MED ORDER — ONDANSETRON HCL 4 MG/2ML IJ SOLN
4.0000 mg | Freq: Once | INTRAMUSCULAR | Status: AC
  Administered 2020-08-29: 4 mg via INTRAVENOUS
  Filled 2020-08-29: qty 2

## 2020-08-29 MED ORDER — ONDANSETRON 4 MG PO TBDP: 4.0000 mg | ORAL_TABLET | Freq: Three times a day (TID) | ORAL | 0 refills | Status: DC | PRN

## 2020-08-29 MED ORDER — IOHEXOL 300 MG/ML  SOLN
75.0000 mL | Freq: Once | INTRAMUSCULAR | Status: AC | PRN
  Administered 2020-08-29: 75 mL via INTRAVENOUS

## 2020-08-29 NOTE — ED Triage Notes (Signed)
Pt reports dx'd with a compression fx a week ago and was told he needed surgery. Pt reports he immediately stopped eating and is now really weak.

## 2020-08-29 NOTE — ED Provider Notes (Signed)
[facility] Emergency Department Provider Note  ____________________________________________   Event Date/Time   First MD Initiated Contact with Patient 08/29/20 1519     (approximate)  I have reviewed the triage vital signs and the nursing notes.   HISTORY  Chief Complaint Weakness    HPI Casey Acevedo is a 80 y.o. male here with generalized weakness.  The patient states that he recently has been less mobile than usual due to ongoing lumbar compression fracture.  He is actually scheduled to get surgery with Dr. Rudene Christians this week.  He states that when he gets in pain, he has to take his pain medicine.  When taking this, he feels nauseous and loses appetite.  Subsequently had decreasing energy and fatigue.  He has felt dizzy when standing.  He has had decreased energy.  He got concerned about getting weaker so presents for further evaluation.  His pain is steady, not acutely worsened.  He has had no lower extremity weakness or numbness.  No loss of bowel or bladder function.  No urinary symptoms other than frequency, which is not necessarily abnormal for him given his history of prostate cancer.  No known sick contacts.    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   Dysphagia 12/06/2016   GERD (gastroesophageal reflux disease) 06/01/2016    Past  Surgical History:  Procedure Laterality Date   CHOLECYSTECTOMY     COMPLETE ESOPHAGECTOMY N/A 06/18/2017   Procedure: cervical ESOPHAGECTOMY COMPLETE;  Surgeon: Grace Isaac, MD;  Location: [place];  Service: Thoracic;  Laterality: N/A;  NEED NIMS ET TUBE   ESOPHAGOGASTRODUODENOSCOPY (EGD) WITH PROPOFOL N/A 12/07/2016   Procedure: ESOPHAGOGASTRODUODENOSCOPY (EGD) WITH PROPOFOL;  Surgeon: Jonathon Bellows, MD;  Location[facility] END[facility]COPY;  Service: Gastroenterology;  Laterality: N/A;   ESOPHAGOGASTRODUODENOSCOPY (EGD) WITH PROPOFOL N/A 12/26/2016   Procedure: ESOPHAGOGASTRODUODENOSCOPY (EGD) WITH PROPOFOL WITH DILATION;  Surgeon: Jonathon Bellows, MD;  Location: [facility] ENDOSCOPY;  Service: Gastroenterology;  Laterality: N/A;   EYE SURGERY     GASTROJEJUNOSTOMY N/A 01/16/2017   Procedure: LAPROSCOPIC ASSIST FEEDING JEJUNOSTOMY TUBE;  Surgeon: Johnathan Hausen, MD;  Location: [facility] ORS;  Service: General;  Laterality: N/A;   IR FLUORO GUIDE PORT INSERTION RIGHT  01/10/2017   IR REMOVAL TUN ACCESS W/ PORT W/O FL MOD SED  03/22/2018   IR REPLACE G-TUBE SIMPLE WO FLUORO  03/15/2017   IR [place] DUODEN/JEJUNO TUBE PERCUT W/FLUORO  03/05/2017   PICC LINE INSERTION Right    PYLOROMYOTOMY N/A 06/18/2017   Procedure: Edwena Blow;  Surgeon: Grace Isaac, MD;  Location: [place];  Service: Thoracic;  Laterality: N/A;   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 Authorizing Provider  docusate sodium (COLACE) 100 MG capsule Take 1 capsule (100 mg total) by mouth daily. 08/29/20 09/28/20 Yes Duffy Bruce,  MD  ondansetron (ZOFRAN ODT) 4 MG disintegrating tablet Take 1 tablet (4 mg total) by mouth every 8 (eight) hours as needed for nausea or vomiting. 08/29/20  Yes Duffy Bruce, MD  Menthol, Topical Analgesic, (BIOFREEZE EX) Apply 1 application topically daily as needed (back pain).    [provider]  Multiple Vitamins-Minerals (MULTIVITAMIN WITH MINERALS) tablet Take 1 tablet by mouth daily.    [provider]  traMADol (ULTRAM) 50 MG tablet Take 50 mg by mouth every 6 (six) hours as needed for pain. 08/23/20   [provider]  Urelle (URELLE/URISED) 81 MG TABS tablet Take 1 tablet (81 mg total) by mouth 3 (three) times daily as needed (Urinary frequency, urgency, burning). Patient not taking: Reported on 08/26/2020 01/13/20   Abbie Sons, MD    Allergies Patient has no known allergies.  Family History  Problem Relation Age of Onset   Heart disease Father    Diabetes Mother    Hypertension Mother    Heart attack Brother 35   Prostate cancer Neg Hx    Bladder Cancer Neg Hx    Kidney cancer Neg Hx     Social History Social History   Tobacco Use   Smoking status: Never   Smokeless tobacco: Never  Vaping Use   Vaping Use: Never used  Substance Use Topics   Alcohol use: No   Drug use: No    Review of Systems  Review of Systems  Constitutional:  Positive for fatigue. Negative for chills and fever.  HENT:  Negative for sore throat.   Respiratory:  Negative for shortness of breath.   Cardiovascular:  Negative for chest pain.  Gastrointestinal:  Negative for abdominal pain.  Genitourinary:  Positive for frequency (Particularly at night). Negative for flank pain.  Musculoskeletal:  Negative for neck pain.  Skin:  Negative for rash and wound.  Allergic/Immunologic: Negative for immunocompromised state.  Neurological:  Positive for weakness. Negative for numbness.  Hematological:  Does not bruise/bleed easily.  All other systems reviewed and are  negative.   ____________________________________________  PHYSICAL EXAM:      VITAL SIGNS: ED Triage Vitals  Enc Vitals Group     BP 08/29/20 1108 120/80     Pulse Rate 08/29/20 1108 (!) 113     Resp 08/29/20 1108 18     Temp 08/29/20 1108 98.2 F (36.8 C)     Temp Source 08/29/20 1108 Oral     SpO2 08/29/20 1108 97 %     Weight 08/29/20 1104 169 lb 12.1 oz (77 kg)     Height 08/29/20 1104 _0  (1.753 m)     Head Circumference --      Peak Flow --      Pain Score 08/29/20 1103 10     Pain Loc --      Pain Edu? --      Excl. in [place]? --      Physical Exam Vitals and nursing note reviewed.  Constitutional:      General: He is not in acute distress.    Appearance: He is well-developed.  HENT:     Head: Normocephalic  and atraumatic.     Mouth/Throat:     Mouth: Mucous membranes are dry.  Eyes:     Conjunctiva/sclera: Conjunctivae normal.  Cardiovascular:     Rate and Rhythm: Normal rate and regular rhythm.     Heart sounds: Normal heart sounds. No murmur heard.   No friction rub.  Pulmonary:     Effort: Pulmonary effort is normal. No respiratory distress.     Breath sounds: Normal breath sounds. No wheezing or rales.  Abdominal:     General: There is no distension.     Palpations: Abdomen is soft.     Tenderness: There is no abdominal tenderness.  Genitourinary:    Comments: Normal rectal tone. Musculoskeletal:     Cervical back: Neck supple.     Comments: Moderate paraspinal tenderness over the lower lumbar spine.  Strength 5 and 5 bilateral lower extremities.  Normal sensation light touch.  Quads 2+ and symmetric bilaterally.  Skin:    General: Skin is warm.     Capillary Refill: Capillary refill takes less than 2 seconds.  Neurological:     Mental Status: He is alert and oriented to person, place, and time.     Motor: No abnormal muscle tone.      ____________________________________________   LABS (all labs ordered are listed, but only abnormal results  are displayed)  Labs Reviewed  BASIC METABOLIC PANEL - Abnormal; Notable for the following components:      Result Value   Sodium 131 (*)    CO2 20 (*)    Glucose, Bld 141 (*)    BUN 27 (*)    All other components within normal limits  CBC - Abnormal; Notable for the following components:   WBC 18.0 (*)    All other components within normal limits  URINALYSIS, COMPLETE (UACMP) WITH MICROSCOPIC - Abnormal; Notable for the following components:   Color, Urine YELLOW (*)    APPearance CLEAR (*)    Specific Gravity, Urine >1.046 (*)    Ketones, ur 20 (*)    All other components within normal limits  LIPASE, BLOOD  TROPONIN I (HIGH SENSITIVITY)  TROPONIN I (HIGH SENSITIVITY)    ____________________________________________  EKG: Sinus tachycardia, VR 109. PR 154, QRS 72, QTc 460. No acute St elevations or depressions. ________________________________________  RADIOLOGY All imaging, including plain films, CT scans, and ultrasounds, independently reviewed by me, and interpretations confirmed via formal radiology reads.  ED MD interpretation:   CT Head: NAICA CT C/A/P: Old lumbar fx, no other acute abnormality, no apparent worsening compared to prior imaging   Official radiology report(s): CT Head Wo Contrast  Result Date: 08/29/2020 CLINICAL DATA:  Head trauma, minor. EXAM: CT HEAD WITHOUT CONTRAST TECHNIQUE: Contiguous axial images were obtained from the base of the skull through the vertex without intravenous contrast. COMPARISON:  Prior head CT 07/27/2017. FINDINGS: Brain: Mild generalized cerebral atrophy. There is no acute intracranial hemorrhage. No demarcated cortical infarct. No extra-axial fluid collection. No evidence of intracranial mass. No midline shift. Vascular: No hyperdense vessel.  Atherosclerotic calcifications. Skull: Normal. Negative for fracture or focal lesion. Sinuses/Orbits: Visualized orbits show no acute finding. No significant paranasal sinus disease at the  imaged levels. IMPRESSION: No evidence of acute intracranial abnormality. Mild generalized cerebral atrophy. Electronically Signed   By: Kellie Simmering DO   On: 08/29/2020 12:22   CT CHEST ABDOMEN PELVIS W CONTRAST  Result Date: 08/29/2020 CLINICAL DATA:  Abdominal trauma. EXAM: CT CHEST, ABDOMEN, AND PELVIS WITH CONTRAST TECHNIQUE:  Multidetector CT imaging of the chest, abdomen and pelvis was performed following the standard protocol during bolus administration of intravenous contrast. CONTRAST:  63m OMNIPAQUE IOHEXOL 300 MG/ML  SOLN COMPARISON:  December 10, 2019 FINDINGS: CT CHEST FINDINGS Cardiovascular: No significant vascular findings. Normal heart size. No pericardial effusion. Calcific atherosclerotic disease of the coronary arteries and aorta. Mediastinum/Nodes: No enlarged mediastinal, hilar, or axillary lymph nodes. Thyroid gland, and trachea demonstrate no significant findings. Post esophagectomy and gastric pull-through. Lungs/Pleura: Lungs are clear. No pleural effusion or pneumothorax. Musculoskeletal: No chest wall mass or suspicious bone lesions identified. CT ABDOMEN PELVIS FINDINGS Hepatobiliary: No hepatic injury or perihepatic hematoma. Post cholecystectomy. Stable left hepatic cyst. Pancreas: Unremarkable. No pancreatic ductal dilatation or surrounding inflammatory changes. Spleen: Scattered splenic granulomas. Adrenals/Urinary Tract: Normal adrenal glands. Bilateral nonobstructive renal calculi measuring up to 4 mm. Subcentimeter hypoattenuated left renal lesion, too small to be accurately characterized. Normal urinary bladder. Stomach/Bowel: Stomach is within normal limits. No evidence of bowel wall thickening, distention, or inflammatory changes. Vascular/Lymphatic: Aortic atherosclerosis. No enlarged abdominal or pelvic lymph nodes. Reproductive: Mild enlargement of the prostate gland. Other: No abdominal wall hernia or abnormality. No abdominopelvic ascites. Musculoskeletal: Triplane  fracture of L1 vertebral body with 3 mm retropulsion, stable from August 25, 2020 accounting for cross modality comparison. 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 No new fractures are seen. IMPRESSION: 1. No evidence of acute traumatic injury to the chest, abdomen or pelvis. 2. Triplane fracture of L1 vertebral body with 3 mm retropulsion, stable from August 25, 2020 accounting for cross modality comparison. 3. 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 4. Bilateral nonobstructive renal calculi. 5. Mild enlargement of the prostate gland. Please correlate to serum PSA values. 6. Aortic atherosclerosis. Aortic Atherosclerosis (ICD10-I70.0). Electronically Signed   By: DFidela SalisburyM.D.   On: 08/29/2020 12:54   DG Abd 2 Views  Result Date: 08/28/2020 CLINICAL DATA:  Fall with right rib pain. EXAM: ABDOMEN - 2 VIEW COMPARISON:  CT 12/10/2019 FINDINGS: No free intra-abdominal air. Enteric sutures at the gastroesophageal junction, patient is post esophagectomy and gastric pull-through. Cholecystectomy clips in the right upper quadrant. Moderate colonic stool burden primarily in the right colon. Occasional foci of high density throughout colonic contents. No small bowel dilatation. No evidence of lower right rib fracture of included ribs. Degenerative change in the spine and both hips. L1 compression fracture was recently assessed with MRI. IMPRESSION: 1. No free air or bowel obstruction. 2. Moderate colonic stool burden, primarily in the right colon. 3. Included right lower ribs are intact without evidence of fracture. Electronically Signed   By: MKeith RakeM.D.   On: 08/28/2020 19:04    ____________________________________________  PROCEDURES   Procedure(s) performed (including Critical Care):  Procedures  ____________________________________________  INITIAL IMPRESSION / MDM / AMiranda/ ED COURSE  As part of my medical decision making, I  reviewed the following data within the [place] reviewed and incorporated, Old chart reviewed, Notes from prior ED visits, and [place] Controlled Substance Database       *JFLYNT BREEZEwas evaluated in Emergency Department on 08/29/2020 for the symptoms described in the history of present illness. He was evaluated in the context of the global COVID-19 pandemic, which necessitated consideration that the patient might be at risk for infection with the SARS-CoV-2 virus that causes COVID-19. Institutional protocols and algorithms that pertain to the evaluation of patients at risk for COVID-19 are  in a state of rapid change based on information released by regulatory bodies including the CDC and federal and state organizations. These policies and algorithms were followed during the patient's care in the ED.  Some ED evaluations and interventions may be delayed as a result of limited staffing during the pandemic.*     Medical Decision Making:  80 yo M here with generalized weakness in setting of recent back fx. Pt has had poor PO intake and subsequent weakness, thought 2/2 his pain medication effects. He is otherwise feeling markedly improved after IVF here and is tolerating PO, in NAD.  Screening lab work reviewed, is consistent with dehydration with decreased bicarb and slight elevated BUN to creatinine ratio and 20 ketones in the urine with elevated specific gravity.  White count 18,000, nonspecific, patient is adamant he has had no cough, fever symptoms, or other signs of infection.  UA without signs of UTI.  Patient had full trauma imaging on arrival, due to his fall.  CT head reviewed and shows no acute abnormality.  CT chest, abdomen/pelvis shows no acute injury or abnormality.  His fracture seems to be at baseline.  No new lower extremity weakness or numbness or signs of cauda equina.  Patient feels markedly improved after fluids here and is tolerating p.o., will ambulate  without difficulty, would like to attempt outpatient management.  Will give him Zofran for nausea, encouraged continued hydration and use of Ensure or protein for nutrition, with outpatient follow-up as needed.  ____________________________________________  FINAL CLINICAL IMPRESSION(S) / ED DIAGNOSES  Final diagnoses:  Chest wall trauma  Abdominal pain  Dehydration     MEDICATIONS GIVEN DURING THIS VISIT:  Medications  iohexol (OMNIPAQUE) 300 MG/ML solution 75 mL (75 mLs Intravenous Contrast Given 08/29/20 1204)  lactated ringers bolus 1,000 mL (1,000 mLs Intravenous New Bag/Given 08/29/20 1543)  ondansetron (ZOFRAN) injection 4 mg (4 mg Intravenous Given 08/29/20 1544)     ED Discharge Orders          Ordered    ondansetron (ZOFRAN ODT) 4 MG disintegrating tablet  Every 8 hours PRN        08/29/20 1651    docusate sodium (COLACE) 100 MG capsule  Daily        08/29/20 1651             Note:  This document was prepared using Dragon voice recognition software and may include unintentional dictation errors.   Duffy Bruce, MD 08/29/20 [phone]

## 2020-08-29 NOTE — ED Triage Notes (Signed)
Provider in room with RN during triage

## 2020-08-29 NOTE — Discharge Instructions (Addendum)
Take the Zofran for nausea, with your pain medication.  Drink at least 6-8 glasses of water daily. If you don't have an appetite, try to drink Ensure or Boost protein to help keep your nutrition up  Take the stool softener as prescribed

## 2020-08-29 NOTE — ED Provider Notes (Signed)
Emergency Medicine Provider Triage Evaluation Note  Casey Acevedo , a 80 y.o. male  was evaluated in triage.  Pt complains of weakness, inability to eat, nausea and "pain everywhere" including chest pain and abdominal pain. He reports he was seen 1 week ago and diagnosed with a compression fracture in his back and is scheduled for surgery on it tomorrow. Has been taking his narcotic for pain but denies that this makes the abdominal pain or nausea any worse or better. He initially denies any falls to me, however after review of his chart and elopement from [place] last night with reports of a fall in the bathtub, he then endorses a fall to me several weeks ago in the bathtub, hitting the right side of his ribs.   Review of Systems  Positive: Abdominal pain, chest pain, weakness, back pain, nausea Negative: Fever, vomiting, diarrhea, consipation  Physical Exam  BP 120/80 (BP Location: Left Arm)   Pulse (!) 113   Temp 98.2 F (36.8 C) (Oral)   Resp 18   Ht 5\' 9"  (1.753 m)   Wt 77 kg   SpO2 97%   BMI 25.07 kg/m  Gen:   In obvious pain but in no acute distress Resp:  Normal effort. Lung CTAB Abdomen: Tender diffusely across abdomen, worse on right than left. MSK:   Moves extremities without difficulty  Medical Decision Making  Medically screening exam initiated at 11:10 AM.  Appropriate orders placed.  [name] was informed that the remainder of the evaluation will be completed by another provider, this initial triage assessment does not replace that evaluation, and the importance of remaining in the ED until their evaluation is complete.  Discussed case with Dr. Charna Archer to ensure all appropriate initial orders were placed.   Marlana Salvage, PA 08/29/20 1119    Blake Divine, MD 08/29/20 [phone]

## 2020-08-31 ENCOUNTER — Encounter: Payer: Self-pay | Admitting: Orthopedic Surgery

## 2020-08-31 ENCOUNTER — Ambulatory Visit: Payer: Medicare Other

## 2020-08-31 ENCOUNTER — Ambulatory Visit: Payer: Medicare Other | Admitting: Urgent Care

## 2020-08-31 ENCOUNTER — Other Ambulatory Visit: Payer: Self-pay

## 2020-08-31 ENCOUNTER — Ambulatory Visit
Admission: RE | Admit: 2020-08-31 | Discharge: 2020-08-31 | Disposition: A | Discharge status: Home / Self Care | Payer: Medicare Other | Source: Ambulatory Visit | Attending: Orthopedic Surgery | Admitting: Orthopedic Surgery

## 2020-08-31 ENCOUNTER — Encounter
Admission: RE | Disposition: A | Discharge status: Home / Self Care | Payer: Self-pay | Source: Ambulatory Visit | Attending: Orthopedic Surgery

## 2020-08-31 DIAGNOSIS — M8088XA Other osteoporosis with current pathological fracture, vertebra(e), initial encounter for fracture: Secondary | ICD-10-CM | POA: Diagnosis not present

## 2020-08-31 DIAGNOSIS — X58XXXA Exposure to other specified factors, initial encounter: Secondary | ICD-10-CM | POA: Insufficient documentation

## 2020-08-31 DIAGNOSIS — Z859 Personal history of malignant neoplasm, unspecified: Secondary | ICD-10-CM | POA: Diagnosis not present

## 2020-08-31 DIAGNOSIS — Z419 Encounter for procedure for purposes other than remedying health state, unspecified: Secondary | ICD-10-CM

## 2020-08-31 DIAGNOSIS — Z9049 Acquired absence of other specified parts of digestive tract: Secondary | ICD-10-CM | POA: Insufficient documentation

## 2020-08-31 HISTORY — PX: KYPHOPLASTY: SHX5884

## 2020-08-31 IMAGING — XA DG C-ARM 1-60 MIN
3 series · 5 of 5 positions shown · non-contrast
Comparison: CT [DATE]

CLINICAL DATA: L1 kyphoplasty.

EXAM:
LUMBAR SPINE - 2-3 VIEW; DG C-ARM 1-60 MIN

[Series 1: dg x-ray · 0.14mm/px · 3 of 3 slices shown]
[im 1/3]
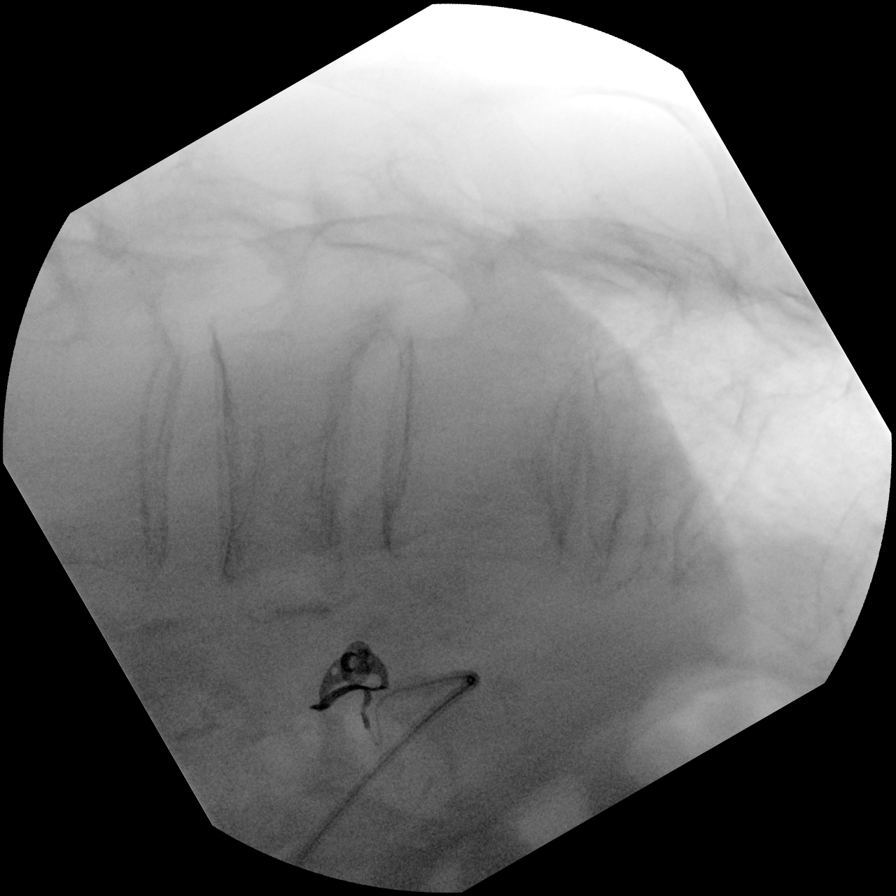
[im 2/3]
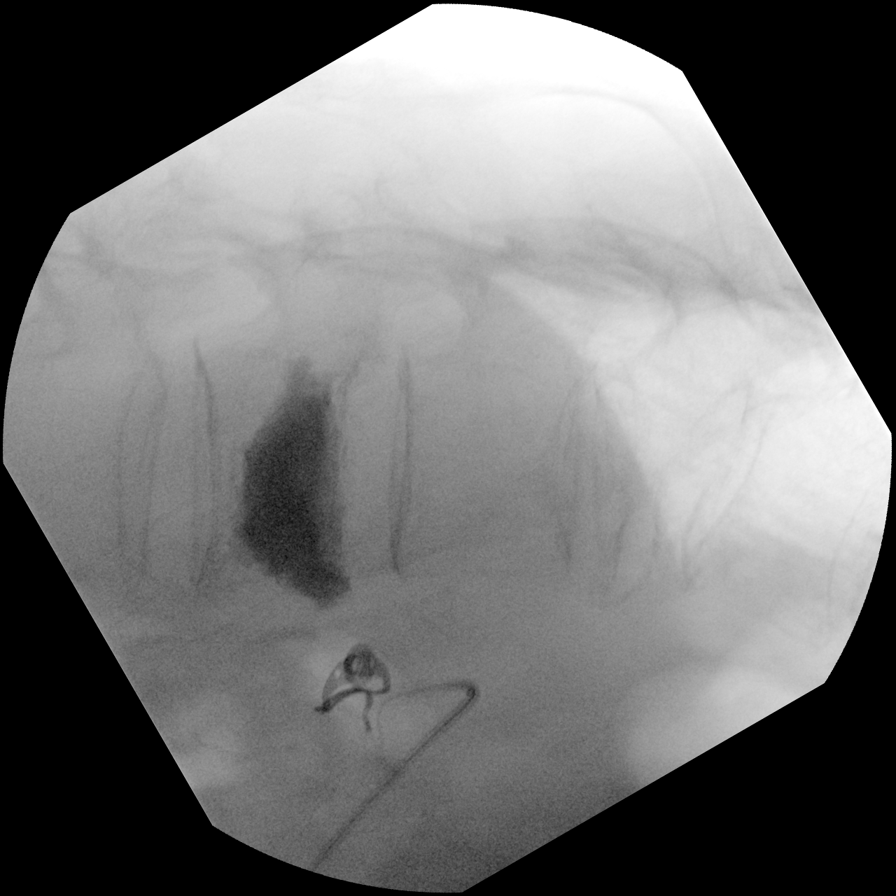
[im 3/3]
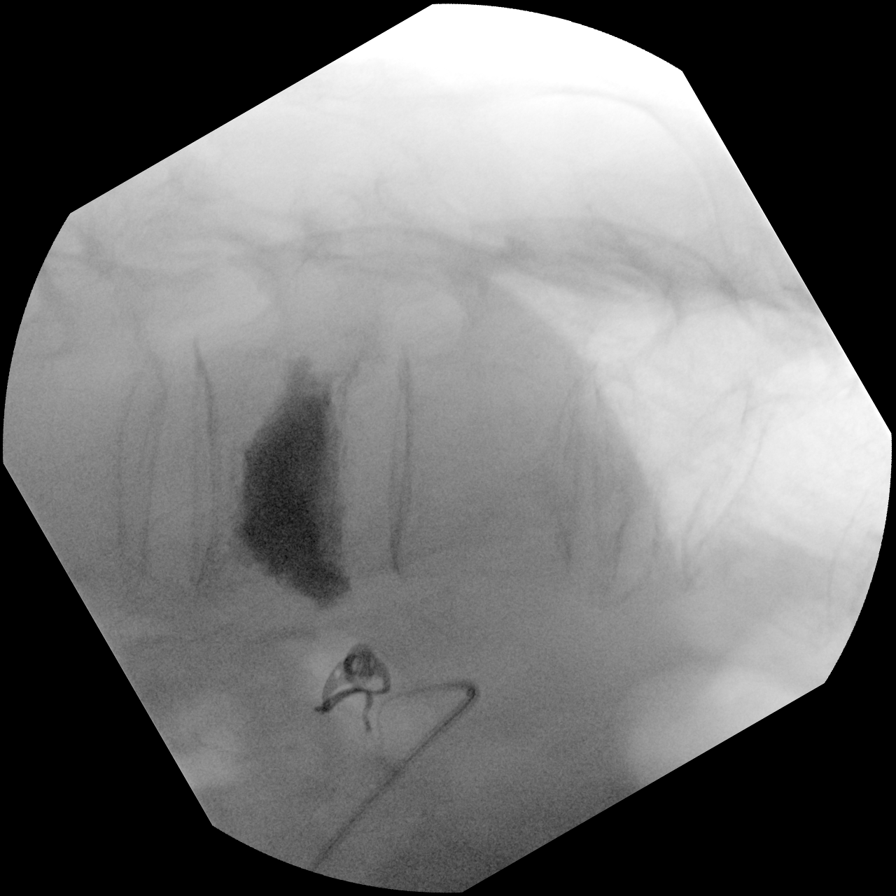

[Series 1: ortho standard · 1 of 1 slices shown (1 of 2)]
[im 1/1]
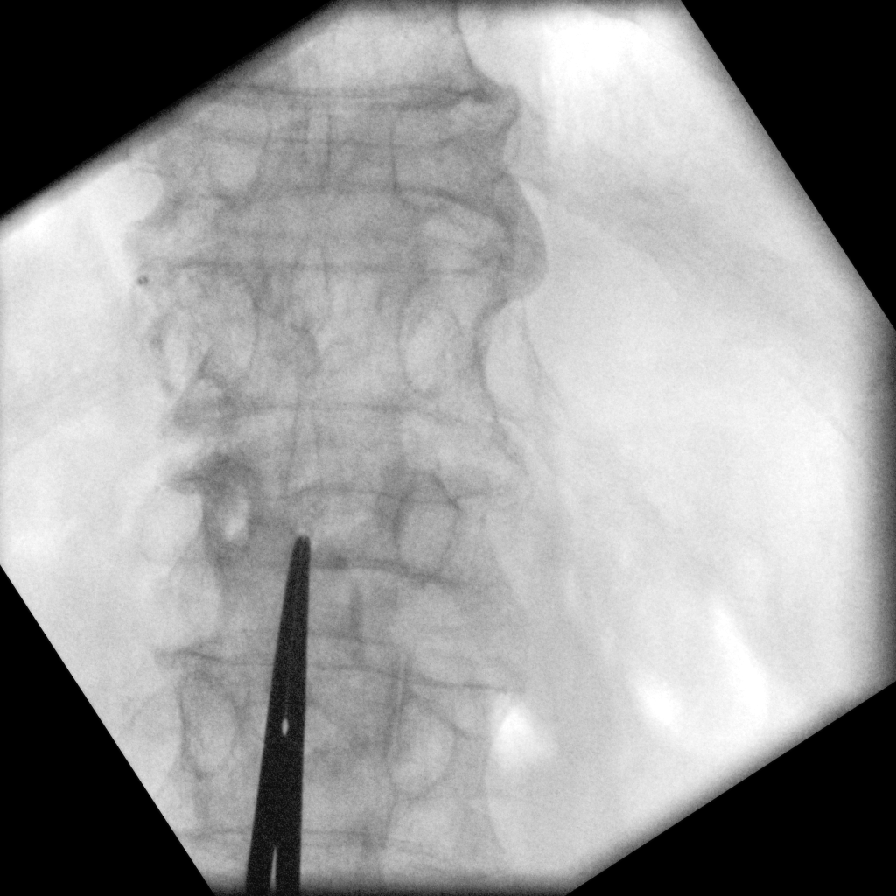

[Series 30: ortho standard · 1 of 1 slices shown (2 of 2)]
[im 1/1]
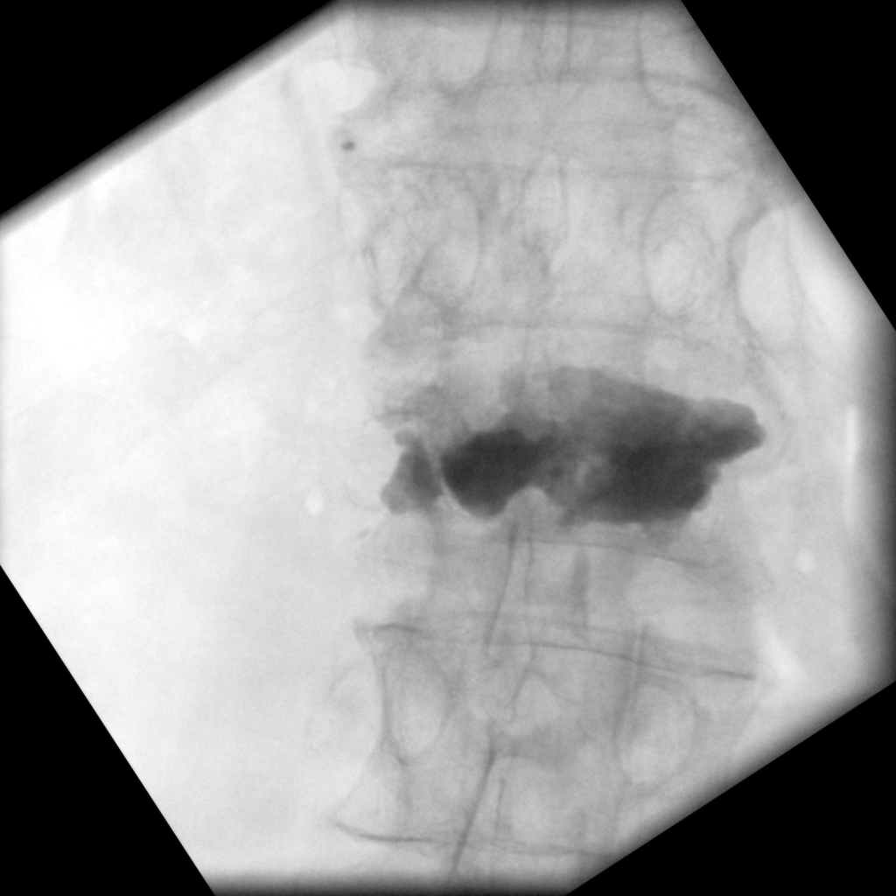

[5 of 5 positions shown; findings below may reference images not displayed]

FINDINGS: Two lateral fluoroscopic spot views of the thoracolumbar junction
obtained in the operating room. Kyphoplasty material within
compression fracture, level difficult to delineate on the provided
coned views. Fluoroscopy time 1 minutes 22 seconds.
IMPRESSION: Intraoperative fluoroscopy for kyphoplasty of compression fracture,
level difficult to delineate on these coned views.

## 2020-08-31 IMAGING — XA DG LUMBAR SPINE 2-3V
3 series · 5 of 5 positions shown · non-contrast
Comparison: CT [DATE]

CLINICAL DATA: L1 kyphoplasty.

EXAM:
LUMBAR SPINE - 2-3 VIEW; DG C-ARM 1-60 MIN

[Series 1: ortho standard · 1 of 1 slices shown (1 of 2)]
[im 1/1]
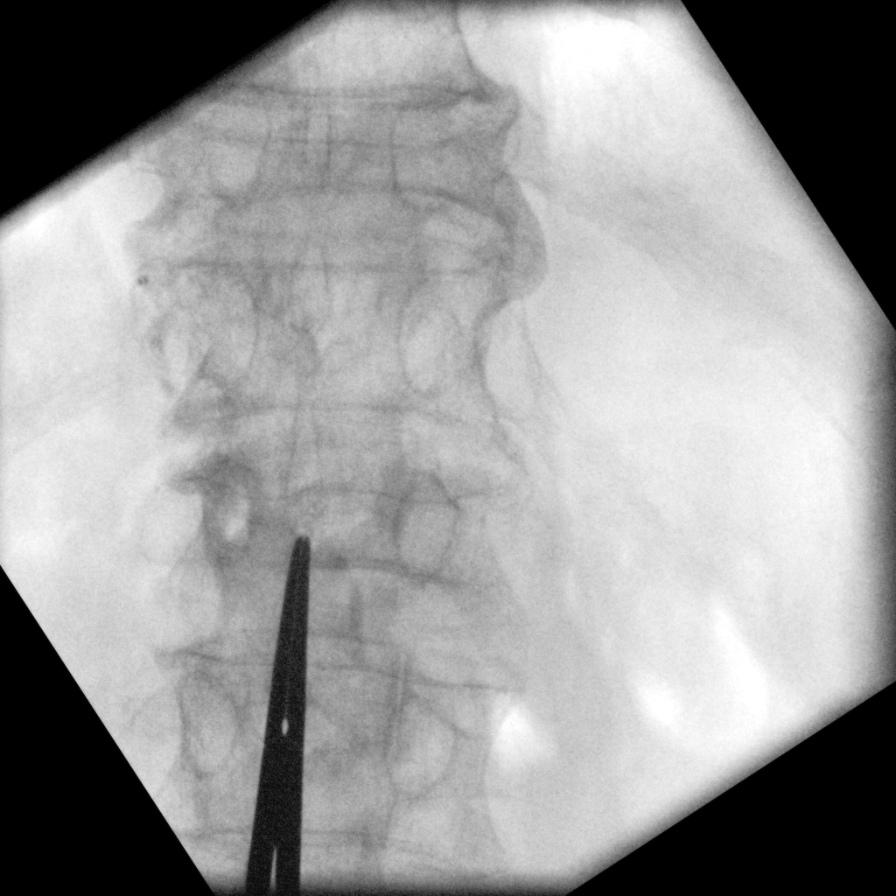

[Series 1: dg x-ray · 0.14mm/px · 3 of 3 slices shown]
[im 1/3]
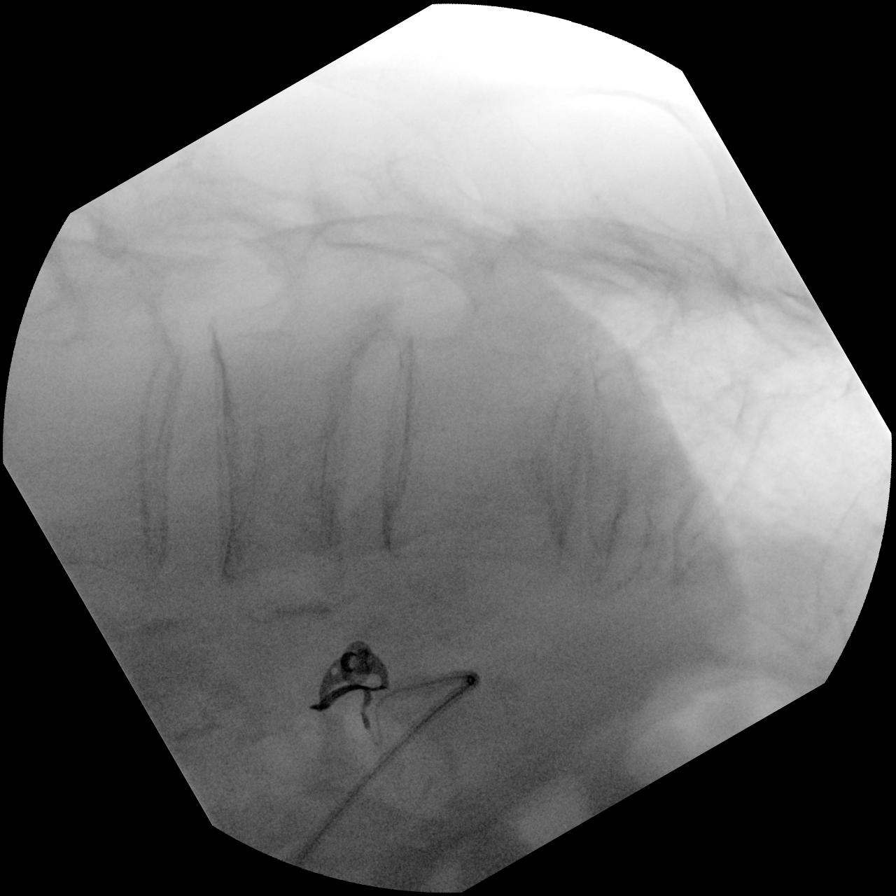
[im 2/3]
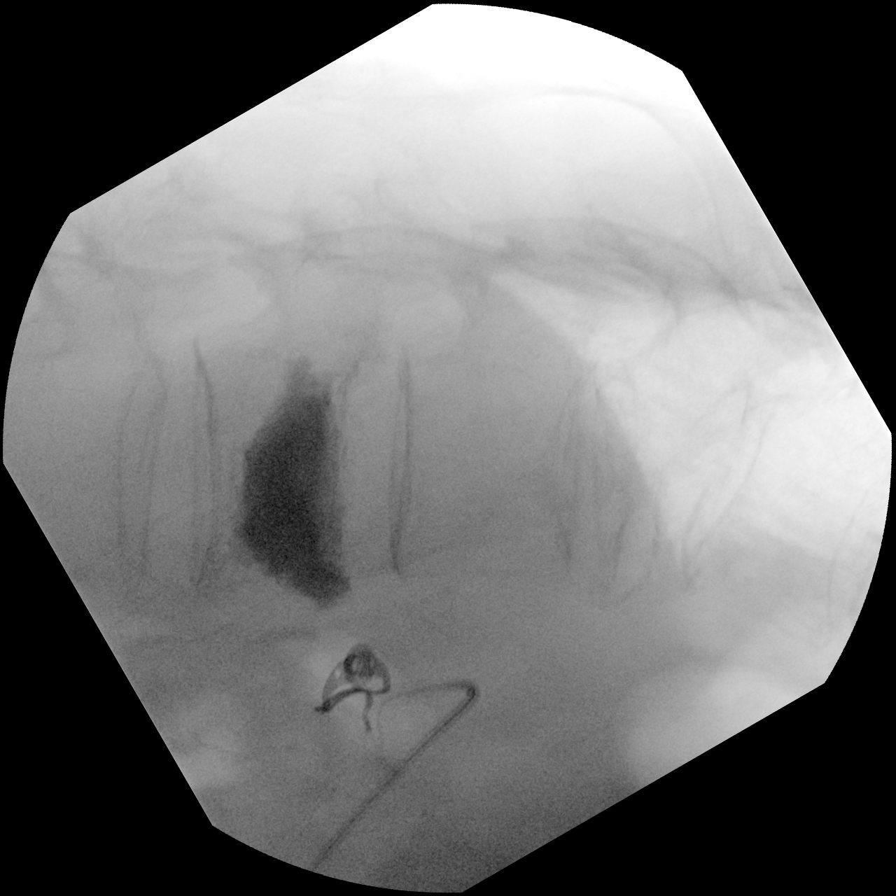
[im 3/3]
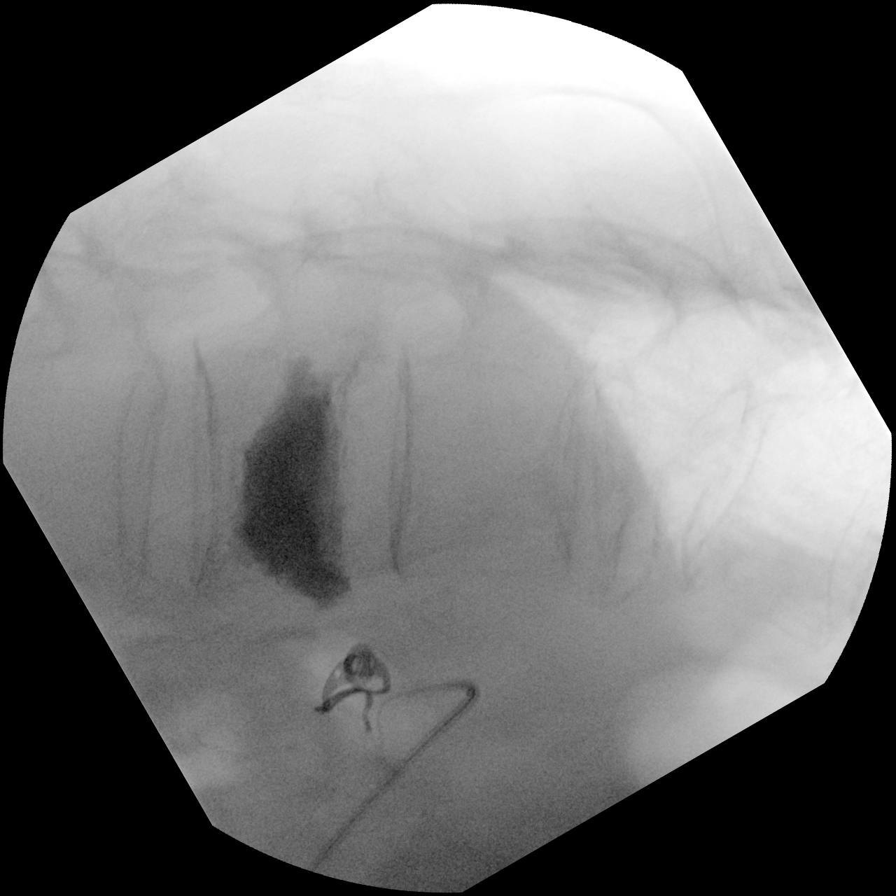

[Series 30: ortho standard · 1 of 1 slices shown (2 of 2)]
[im 1/1]
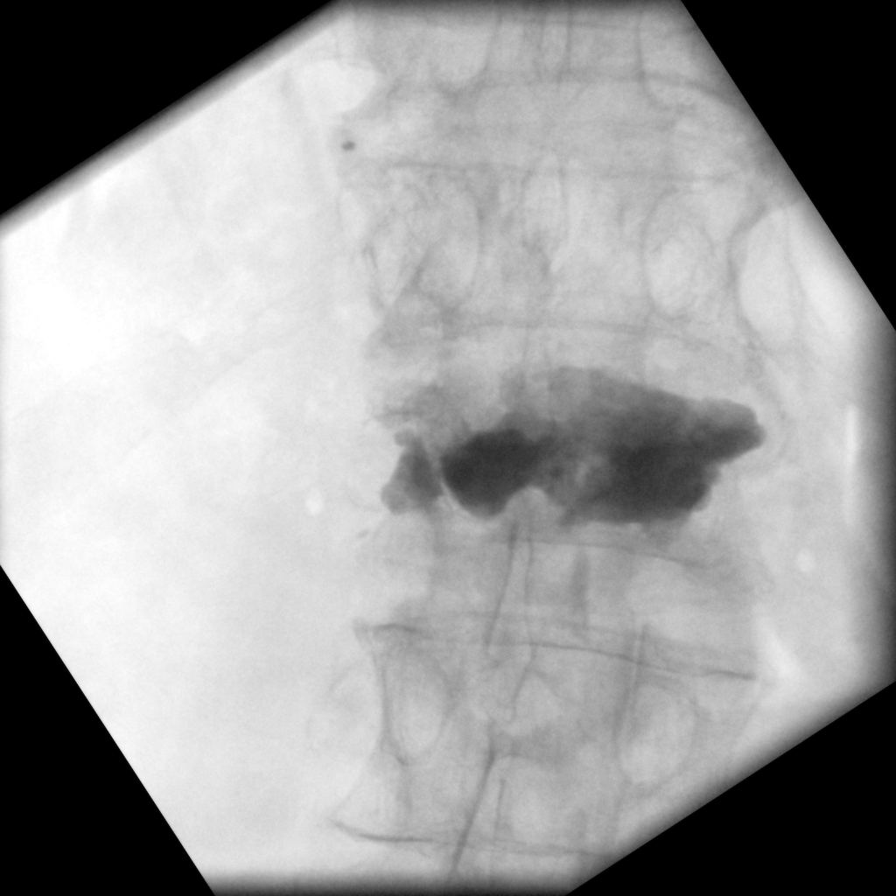

[5 of 5 positions shown; findings below may reference images not displayed]

FINDINGS: Two lateral fluoroscopic spot views of the thoracolumbar junction
obtained in the operating room. Kyphoplasty material within
compression fracture, level difficult to delineate on the provided
coned views. Fluoroscopy time 1 minutes 22 seconds.
IMPRESSION: Intraoperative fluoroscopy for kyphoplasty of compression fracture,
level difficult to delineate on these coned views.

## 2020-08-31 SURGERY — KYPHOPLASTY
Anesthesia: General

## 2020-08-31 MED ORDER — LIDOCAINE HCL (PF) 1 % IJ SOLN
INTRAMUSCULAR | Status: AC
  Filled 2020-08-31: qty 30

## 2020-08-31 MED ORDER — PROPOFOL 10 MG/ML IV BOLUS
INTRAVENOUS | Status: AC
  Filled 2020-08-31: qty 20

## 2020-08-31 MED ORDER — CHLORHEXIDINE GLUCONATE 0.12 % MT SOLN
OROMUCOSAL | Status: AC
  Administered 2020-08-31: 15 mL via OROMUCOSAL
  Filled 2020-08-31: qty 15

## 2020-08-31 MED ORDER — FENTANYL CITRATE (PF) 100 MCG/2ML IJ SOLN
INTRAMUSCULAR | Status: AC
  Filled 2020-08-31: qty 2

## 2020-08-31 MED ORDER — CHLORHEXIDINE GLUCONATE 0.12 % MT SOLN: 15.0000 mL | Freq: Once | OROMUCOSAL | Status: AC

## 2020-08-31 MED ORDER — LIDOCAINE HCL 1 % IJ SOLN
INTRAMUSCULAR | Status: DC | PRN
  Administered 2020-08-31: 30 mL

## 2020-08-31 MED ORDER — LACTATED RINGERS IV SOLN: INTRAVENOUS | Status: DC

## 2020-08-31 MED ORDER — CEFAZOLIN SODIUM-DEXTROSE 2-4 GM/100ML-% IV SOLN
2.0000 g | INTRAVENOUS | Status: AC
  Administered 2020-08-31: 2 g via INTRAVENOUS

## 2020-08-31 MED ORDER — ONDANSETRON HCL 4 MG/2ML IJ SOLN: 4.0000 mg | Freq: Four times a day (QID) | INTRAMUSCULAR | Status: DC | PRN

## 2020-08-31 MED ORDER — FAMOTIDINE 20 MG PO TABS
20.0000 mg | ORAL_TABLET | Freq: Once | ORAL | Status: AC
  Administered 2020-08-31: 20 mg via ORAL

## 2020-08-31 MED ORDER — FENTANYL CITRATE (PF) 100 MCG/2ML IJ SOLN
25.0000 ug | INTRAMUSCULAR | Status: DC | PRN
  Administered 2020-08-31: 25 ug via INTRAVENOUS

## 2020-08-31 MED ORDER — OXYCODONE HCL 5 MG PO TABS
ORAL_TABLET | ORAL | Status: AC
  Filled 2020-08-31: qty 1

## 2020-08-31 MED ORDER — ORAL CARE MOUTH RINSE: 15.0000 mL | Freq: Once | OROMUCOSAL | Status: AC

## 2020-08-31 MED ORDER — METOCLOPRAMIDE HCL 5 MG/ML IJ SOLN: 5.0000 mg | Freq: Three times a day (TID) | INTRAMUSCULAR | Status: DC | PRN

## 2020-08-31 MED ORDER — OXYCODONE HCL 5 MG PO TABS
5.0000 mg | ORAL_TABLET | Freq: Once | ORAL | Status: AC | PRN
  Administered 2020-08-31: 5 mg via ORAL

## 2020-08-31 MED ORDER — DEXMEDETOMIDINE (PRECEDEX) IN NS 20 MCG/5ML (4 MCG/ML) IV SYRINGE
PREFILLED_SYRINGE | INTRAVENOUS | Status: DC | PRN
  Administered 2020-08-31 (×2): 4 ug via INTRAVENOUS
  Administered 2020-08-31: 8 ug via INTRAVENOUS

## 2020-08-31 MED ORDER — ACETAMINOPHEN 10 MG/ML IV SOLN: 1000.0000 mg | Freq: Once | INTRAVENOUS | Status: DC | PRN

## 2020-08-31 MED ORDER — TRAMADOL HCL 50 MG PO TABS: 50.0000 mg | ORAL_TABLET | Freq: Four times a day (QID) | ORAL | 0 refills | Status: DC | PRN

## 2020-08-31 MED ORDER — GLYCOPYRROLATE 0.2 MG/ML IJ SOLN
INTRAMUSCULAR | Status: DC | PRN
  Administered 2020-08-31: .2 mg via INTRAVENOUS

## 2020-08-31 MED ORDER — ONDANSETRON HCL 4 MG/2ML IJ SOLN
INTRAMUSCULAR | Status: AC
  Administered 2020-08-31: 4 mg via INTRAVENOUS
  Filled 2020-08-31: qty 2

## 2020-08-31 MED ORDER — FENTANYL CITRATE (PF) 100 MCG/2ML IJ SOLN
INTRAMUSCULAR | Status: AC
  Administered 2020-08-31: 25 ug via INTRAVENOUS
  Filled 2020-08-31: qty 2

## 2020-08-31 MED ORDER — BUPIVACAINE-EPINEPHRINE (PF) 0.5% -1:200000 IJ SOLN
INTRAMUSCULAR | Status: DC | PRN
  Administered 2020-08-31: 20 mL

## 2020-08-31 MED ORDER — FENTANYL CITRATE (PF) 100 MCG/2ML IJ SOLN
INTRAMUSCULAR | Status: DC | PRN
  Administered 2020-08-31: 50 ug via INTRAVENOUS

## 2020-08-31 MED ORDER — OXYCODONE HCL 5 MG/5ML PO SOLN
5.0000 mg | Freq: Once | ORAL | Status: AC | PRN
Start: 2020-08-31 — End: 2020-08-31

## 2020-08-31 MED ORDER — PHENYLEPHRINE HCL (PRESSORS) 10 MG/ML IV SOLN
INTRAVENOUS | Status: AC
  Filled 2020-08-31: qty 1

## 2020-08-31 MED ORDER — FAMOTIDINE 20 MG PO TABS
ORAL_TABLET | ORAL | Status: AC
  Filled 2020-08-31: qty 1

## 2020-08-31 MED ORDER — MIDAZOLAM HCL 2 MG/2ML IJ SOLN
INTRAMUSCULAR | Status: AC
  Filled 2020-08-31: qty 2

## 2020-08-31 MED ORDER — ONDANSETRON HCL 4 MG/2ML IJ SOLN: 4.0000 mg | Freq: Once | INTRAMUSCULAR | Status: AC | PRN

## 2020-08-31 MED ORDER — IOHEXOL 180 MG/ML  SOLN
INTRAMUSCULAR | Status: DC | PRN
  Administered 2020-08-31: 10 mL

## 2020-08-31 MED ORDER — CEFAZOLIN SODIUM-DEXTROSE 2-4 GM/100ML-% IV SOLN
INTRAVENOUS | Status: AC
  Filled 2020-08-31: qty 100

## 2020-08-31 MED ORDER — BUPIVACAINE-EPINEPHRINE (PF) 0.5% -1:200000 IJ SOLN
INTRAMUSCULAR | Status: AC
  Filled 2020-08-31: qty 30

## 2020-08-31 MED ORDER — KETAMINE HCL 10 MG/ML IJ SOLN
INTRAMUSCULAR | Status: DC | PRN
  Administered 2020-08-31: 20 mg via INTRAVENOUS
  Administered 2020-08-31 (×2): 10 mg via INTRAVENOUS

## 2020-08-31 MED ORDER — ONDANSETRON HCL 4 MG PO TABS: 4.0000 mg | ORAL_TABLET | Freq: Four times a day (QID) | ORAL | Status: DC | PRN

## 2020-08-31 MED ORDER — SODIUM CHLORIDE 0.9 % IV SOLN: INTRAVENOUS | Status: DC

## 2020-08-31 MED ORDER — ACETAMINOPHEN 10 MG/ML IV SOLN
INTRAVENOUS | Status: AC
  Filled 2020-08-31: qty 100

## 2020-08-31 MED ORDER — ACETAMINOPHEN 10 MG/ML IV SOLN
INTRAVENOUS | Status: AC
  Administered 2020-08-31: 1000 mg via INTRAVENOUS
  Filled 2020-08-31: qty 100

## 2020-08-31 MED ORDER — METOCLOPRAMIDE HCL 10 MG PO TABS: 5.0000 mg | ORAL_TABLET | Freq: Three times a day (TID) | ORAL | Status: DC | PRN

## 2020-08-31 SURGICAL SUPPLY — 23 items
ADH SKN CLS APL DERMABOND .7 (GAUZE/BANDAGES/DRESSINGS) ×1
CEMENT KYPHON CX01A KIT/MIXER (Cement) ×3 IMPLANT
COVER WAND RF STERILE (DRAPES) ×3 IMPLANT
DERMABOND ADVANCED (GAUZE/BANDAGES/DRESSINGS) ×2
DERMABOND ADVANCED .7 DNX12 (GAUZE/BANDAGES/DRESSINGS) ×1 IMPLANT
DEVICE BIOPSY BONE KYPHX (INSTRUMENTS) ×3 IMPLANT
DRAPE C-ARM XRAY 36X54 (DRAPES) ×3 IMPLANT
DURAPREP 26ML APPLICATOR (WOUND CARE) ×3 IMPLANT
FEE RENTAL RFA GENERATOR (MISCELLANEOUS) IMPLANT
GLOVE SURG SYN 9.0  PF PI (GLOVE) ×3
GLOVE SURG SYN 9.0 PF PI (GLOVE) ×1 IMPLANT
GOWN SRG 2XL LVL 4 RGLN SLV (GOWNS) ×1 IMPLANT
GOWN STRL NON-REIN 2XL LVL4 (GOWNS) ×3
GOWN STRL REUS W/ TWL LRG LVL3 (GOWN DISPOSABLE) ×1 IMPLANT
GOWN STRL REUS W/TWL LRG LVL3 (GOWN DISPOSABLE) ×3
MANIFOLD NEPTUNE II (INSTRUMENTS) ×3 IMPLANT
PACK KYPHOPLASTY (MISCELLANEOUS) ×3 IMPLANT
RENTAL RFA  GENERATOR (MISCELLANEOUS)
RENTAL RFA GENERATOR (MISCELLANEOUS) IMPLANT
STRAP SAFETY 5IN WIDE (MISCELLANEOUS) ×3 IMPLANT
SWABSTK COMLB BENZOIN TINCTURE (MISCELLANEOUS) ×3 IMPLANT
TRAY KYPHOPAK 15/3 EXPRESS 1ST (MISCELLANEOUS) ×1 IMPLANT
TRAY KYPHOPAK 20/3 EXPRESS 1ST (MISCELLANEOUS) ×3 IMPLANT

## 2020-08-31 NOTE — Transfer of Care (Signed)
Immediate Anesthesia Transfer of Care Note  Patient: Casey Acevedo  Procedure(s) Performed: L1 KYPHOPLASTY  Patient Location: PACU  Anesthesia Type:Spinal  Level of Consciousness: awake and oriented  Airway & Oxygen Therapy: Patient Spontanous Breathing and Patient connected to nasal cannula oxygen  Post-op Assessment: Report given to RN and Post -op Vital signs reviewed and stable  Post vital signs: Reviewed and stable  Last Vitals:  Vitals Value Taken Time  BP 121/75 08/31/20 1523  Temp    Pulse 102 08/31/20 1530  Resp 9 08/31/20 1530  SpO2 100 % 08/31/20 1530  Vitals shown include unvalidated device data.  Last Pain:  Vitals:   08/31/20 1341  TempSrc: Temporal  PainSc: 0-No pain         Complications: No notable events documented.

## 2020-08-31 NOTE — Anesthesia Preprocedure Evaluation (Signed)
Anesthesia Evaluation  Patient identified by MRN, date of birth, ID band Patient awake    Reviewed: Allergy & Precautions, H&P , NPO status , Patient's Chart, lab work & pertinent test results  History of Anesthesia Complications Negative for: history of anesthetic complications  Airway Mallampati: III  TM Distance: >3 FB Neck ROM: full  Mouth opening: Limited Mouth Opening Comment: No stiffness or rigidity of neck tissue Dental  (+) Teeth Intact   Pulmonary neg pulmonary ROS, neg sleep apnea, neg COPD,    breath sounds clear to auscultation       Cardiovascular Exercise Tolerance: Good (-) angina+ CAD  (-) Past MI and (-) Cardiac Stents (-) dysrhythmias  Rhythm:regular Rate:Normal     Neuro/Psych negative neurological ROS  negative psych ROS   GI/Hepatic Neg liver ROS, GERD  ,S/p esophagectomy for esophageal cancer, treated with radiation as well.   Endo/Other  negative endocrine ROS  Renal/GU      Musculoskeletal   Abdominal   Peds  Hematology negative hematology ROS (+)   Anesthesia Other Findings Past Medical History: No date: Dysphagia 2018: Esophageal cancer ([place])     Comment:  Chemo + Rad tx's with surgical resection.   Past Surgical History: No date: CHOLECYSTECTOMY 06/18/2017: COMPLETE ESOPHAGECTOMY; N/A     Comment:  Procedure: cervical ESOPHAGECTOMY COMPLETE;  Surgeon:               Grace Isaac, MD;  Location: [facility] OR;  Service:               Thoracic;  Laterality: N/A;  NEED NIMS ET TUBE 12/07/2016: ESOPHAGOGASTRODUODENOSCOPY (EGD) WITH PROPOFOL; N/A     Comment:  Procedure: ESOPHAGOGASTRODUODENOSCOPY (EGD) WITH               PROPOFOL;  Surgeon: Jonathon Bellows, MD;  Location: [facility]               ENDOSCOPY;  Service: Gastroenterology;  Laterality: N/A; 12/26/2016: ESOPHAGOGASTRODUODENOSCOPY (EGD) WITH PROPOFOL; N/A     Comment:  Procedure: ESOPHAGOGASTRODUODENOSCOPY (EGD) WITH                PROPOFOL WITH DILATION;  Surgeon: Jonathon Bellows, MD;                Location: [facility] ENDOSCOPY;  Service: Gastroenterology;                Laterality: N/A; No date: EYE SURGERY 01/16/2017: GASTROJEJUNOSTOMY; N/A     Comment:  Procedure: LAPROSCOPIC ASSIST FEEDING JEJUNOSTOMY TUBE;               Surgeon: Johnathan Hausen, MD;  Location: [facility] ORS;                Service: General;  Laterality: N/A; 01/10/2017: IR FLUORO GUIDE PORT INSERTION RIGHT 03/22/2018: IR REMOVAL TUN ACCESS W/ PORT W/O FL MOD SED 03/15/2017: IR REPLACE G-TUBE SIMPLE WO FLUORO 03/05/2017: IR REPLC DUODEN/JEJUNO TUBE PERCUT W/FLUORO No date: PICC LINE INSERTION; Right 06/18/2017: Edwena Blow; N/A     Comment:  Procedure: PYLOROMYOTOMY;  Surgeon: Grace Isaac,               MD;  Location: [facility] OR;  Service: Thoracic;  Laterality:               N/A; 06/18/2017: VIDEO BRONCHOSCOPY; N/A     Comment:  Procedure: VIDEO BRONCHOSCOPY;  Surgeon: Grace Isaac,  MD;  Location: [facility] OR;  Service: Thoracic;                Laterality: N/A;  BMI    Body Mass Index: 25.10 kg/m      Reproductive/Obstetrics negative OB ROS                             Anesthesia Physical  Anesthesia Plan  ASA: 3  Anesthesia Plan: General   Post-op Pain Management:    Induction: Intravenous  PONV Risk Score and Plan: 2 and Ondansetron, Dexamethasone and Treatment may vary due to age or medical condition  Airway Management Planned: Nasal Cannula and Natural Airway  Additional Equipment: None  Intra-op Plan:   Post-operative Plan:   Informed Consent: I have reviewed the patients History and Physical, chart, labs and discussed the procedure including the risks, benefits and alternatives for the proposed anesthesia with the patient or authorized representative who has indicated his/her understanding and acceptance.     Dental Advisory Given  Plan Discussed with: Anesthesiologist, CRNA and  Surgeon  Anesthesia Plan Comments: (Discussed risks of anesthesia with patient, including possibility of difficulty with spontaneous ventilation under anesthesia necessitating airway intervention, PONV, and rare risks such as cardiac or respiratory or neurological events. Patient understands.)        Anesthesia Quick Evaluation

## 2020-08-31 NOTE — Discharge Instructions (Addendum)
Take it easy today. Tomorrow try to get up and start walking is much as you can. Thursday remove Band-Aids and okay to shower and resume more normal activities as tolerated. Call office if you are having problems.    AMBULATORY SURGERY  DISCHARGE INSTRUCTIONS   The drugs that you were given will stay in your system until tomorrow so for the next 24 hours you should not:  Drive an automobile Make any legal decisions Drink any alcoholic beverage   You may resume regular meals tomorrow.  Today it is better to start with liquids and gradually work up to solid foods.  You may eat anything you prefer, but it is better to start with liquids, then soup and crackers, and gradually work up to solid foods.   Please notify your doctor immediately if you have any unusual bleeding, trouble breathing, redness and pain at the surgery site, drainage, fever, or pain not relieved by medication.    Additional Instructions:        Please contact your physician with any problems or Same Day Surgery at [phone], Monday through Friday 6 am to 4 pm, or [place] at [facility] number at [phone].

## 2020-08-31 NOTE — H&P (Signed)
Chief Complaint  Patient presents with   Back Pain  T11 compression fx   Casey Acevedo is a 80 y.o. male who presents today for evaluation of multiple compression fractures at T10, T11 and L1. A week and a half ago he developed severe back pain along the thoracolumbar junction radiating to the left side. He describes a deep aching pain with no burning numbness tingling or radicular symptoms. He denies any hip, buttocks, sacral or groin pain. No lower extremity discomfort. Is been taken tramadol with some relief. He is been taking Robaxin with some relief. Pain is severe and limiting his ability to walk. He is been walking with a walker over the last few days. He has severe pain with laying down flat.  Past Medical History: Past Medical History:  Diagnosis Date   GERD (gastroesophageal reflux disease)   History of cancer 2018   History of cataract 2005   Past Surgical History: Past Surgical History:  Procedure Laterality Date   CATARACT EXTRACTION 2005   CHOLECYSTECTOMY   ESOPHAGECTOMY   Past Family History: Family History  Problem Relation Age of Onset   Heart failure Mother   Heart failure Father   Heart failure Brother   Medications: Current Outpatient Medications Ordered in Epic  Medication Sig Dispense Refill   methocarbamoL (ROBAXIN) 500 MG tablet Take 1 tablet (500 mg total) by mouth 2 (two) times daily for 10 days 20 tablet 0   multivitamin tablet Take 1 tablet by mouth once daily.   predniSONE (DELTASONE) 10 MG tablet Taper 6-6-4-4-2-2-1-1-off 26 tablet 0   traMADoL (ULTRAM) 50 mg tablet Take 1 tablet (50 mg total) by mouth every 6 (six) hours as needed for Pain 30 tablet 0   No current Epic-ordered facility-administered medications on file.   Allergies: No Known Allergies   Review of Systems:  A comprehensive 14 point ROS was performed, reviewed by me today, and the pertinent orthopaedic findings are documented in the HPI.  Exam: BP 122/78  Wt 72.6 kg (160 lb)   BMI 24.33 kg/m  General:  Well developed, well nourished, no apparent distress, normal affect, normal gait with no antalgic component.   HEENT: Head normocephalic, atraumatic, PERRL.   Abdomen: Soft, non tender, non distended, Bowel sounds present.  Heart: Examination of the heart reveals regular, rate, and rhythm. There is no murmur noted on ascultation. There is a normal apical pulse.  Lungs: Lungs are clear to auscultation. There is no wheeze, rhonchi, or crackles. There is normal expansion of bilateral chest walls.   Examination of thoracolumbar spine shows some tenderness to percussion along the thoracolumbar junction with no left or right paravertebral muscle tenderness or muscle spasms. She is nontender along the SI joints, sacral region. He has full range of motion of both hips with no discomfort. Is able to ambulate with a walker. He is neuro vas intact in bilateral lower extremities.  Lumbar spine x-rays reviewed by me from 08/19/2020 show severe compression deformity of T11 and mild compression deformities of O70 and L1, uncertain chronicity.  EXAM:  MRI LUMBAR SPINE WITHOUT CONTRAST   TECHNIQUE:  Multiplanar, multisequence MR imaging of the lumbar spine was  performed. No intravenous contrast was administered.   COMPARISON:  CT chest/abdomen/pelvis 12/10/2019. report from lumbar  spine radiographs 08/19/2020 (images unavailable).   FINDINGS:  Segmentation: 5 lumbar vertebrae. The caudal most well-formed  intervertebral disc space is designated L5-S1.   Alignment: 3 mm bony retropulsion at the level of the L1 superior  endplate.   Vertebrae: L1 vertebral compression fracture (30% height loss  centrally). Vertically oriented and obliquely oriented fracture  planes are present within the L1 vertebral body. Edema signal is  present throughout the majority of the L1 vertebral body and this  fracture is likely acute. Edema also extends into the right L1  pedicle. L1  vertebral body signal is somewhat heterogeneous.  Incompletely imaged severe chronic T11 vertebral compression  fracture. Vertebral body height is otherwise maintained at the  imaged levels. No significant marrow edema elsewhere. L5 vertebral  body hemangioma. Multilevel ventrolateral osteophytes.   Conus medullaris and cauda equina: Conus extends to the L2 level. No  signal abnormality within the visualized distal spinal cord.   Paraspinal and other soft tissues: No abnormality identified within  included portions of the abdomen/retroperitoneum. Paraspinal soft  tissues within normal limits.   Disc levels:   No more than mild disc degeneration at any level.   T11-T12: Imaged sagittally. Facet arthrosis. No significant disc  herniation or stenosis.   T12-L1: 3 mm bony retropulsion at the level of the L1 superior  endplate. Associated small disc bulge. Mild facet  arthrosis/ligamentum flavum hypertrophy. Bony retropulsion results  in mild relative spinal canal narrowing without spinal cord mass  effect. No significant foraminal stenosis.   L1-L2: Small disc bulge. Mild facet arthrosis/ligamentum flavum  hypertrophy. Mild relative spinal canal narrowing without nerve root  impingement. No significant foraminal stenosis.   L2-L3: Central posterior annular fissure. Small disc bulge. Mild  facet arthrosis. Mild relative spinal canal narrowing without nerve  root impingement. No significant foraminal stenosis.   L3-L4: Disc bulge slightly asymmetric to the left. Endplate spurring  along the left aspect of the disc space. Mild facet arthrosis and  ligamentum flavum hypertrophy. No significant spinal canal stenosis.  Mild bilateral neural foraminal narrowing (greater on the left).   L4-L5: Disc bulge. Superimposed broad-based central disc protrusion  at site of posterior annular fissure. Facet arthrosis (mild right,  mild/moderate left). Left greater than right ligamentum flavum   hypertrophy. The disc protrusion contributes to mild bilateral  subarticular narrowing with slight crowding of the descending L5  nerve roots (series 19, image 30). Mild relative narrowing of the  central canal. Moderate bilateral neural foraminal narrowing.   L5-S1: Left subarticular/foraminal posterior annular fissure. Small  disc bulge. Mild facet arthrosis. No significant spinal canal or  foraminal stenosis.   Impression #1 will be called to the ordering clinician or  representative by the Radiologist Assistant, and communication  documented in the PACS or [employer].   IMPRESSION:  L1 vertebral compression fracture (30% height loss). There are  obliquely and vertically oriented fracture planes within the  vertebra. There is edema signal throughout the L1 vertebra and  extending into the right L1 pedicle, and this fracture is likely  acute. The L1 vertebral body signal is somewhat heterogeneous and  short interval 3-4 month MRI follow-up is recommended to exclude an  underlying lesion (i.e. osseous metastatic disease).   3 mm bony retropulsion at the level of the L1 superior endplate.  This results in mild relative spinal canal narrowing without spinal  cord mass effect.   Lumbar spondylosis, as outlined, with no more than mild degenerative  spinal canal stenosis. Most notably, at L4-L5 a broad-based central  disc protrusion contributes to multifactorial mild bilateral  subarticular narrowing with crowding of the descending L5 nerve  roots. Multilevel neural foraminal narrowing, greatest bilaterally  at L4-L5 (moderate).   Electronically  Signed    By: Kellie Simmering DO    On: 08/25/2020 16:10  CLINICAL DATA:  Closed wedge compression fracture of T12 vertebra,  initial encounter. Closed wedge compression fracture of T10  vertebra, initial encounter. Additional history provided by scanning  technologist: Patient reports back pain at bowel line which radiates  around  to anterior left abdomen for 1 week.   EXAM:  MRI THORACIC SPINE WITHOUT CONTRAST   TECHNIQUE:  Multiplanar, multisequence MR imaging of the thoracic spine was  performed. No intravenous contrast was administered.   COMPARISON:  CT chest/abdomen/pelvis 12/10/2019.   FINDINGS:  Alignment: 4 mm bony retropulsion at the level of the T11 superior  endplate. No significant bony retropulsion at the remaining levels.  No significant spondylolisthesis.   Vertebrae: Unchanged mild chronic superior endplate compression  deformities at T1, T4 and T5. Redemonstrated severe chronic T11  vertebral compression fracture with unchanged 80% height loss. There  is mild edema within the T11 vertebral body, likely degenerative. No  significant marrow edema or focal suspicious osseous lesion  elsewhere within the thoracic spine. Focal fat versus hemangiomas  within the T5 and T6 vertebrae.   Cord:  No spinal cord signal abnormality is identified.   Paraspinal and other soft tissues: Redemonstrated postoperative  sequela from prior esophagectomy and gastric pull-through. No  abnormality identified within included portions of the thorax or  upper abdomen/retroperitoneum. Paraspinal soft tissues within normal  limits.   Disc levels:   No more than mild disc degeneration at any level. There are small  multilevel disc bulges. Additionally, small central disc protrusions  are present at T4-T5 and T5-T6. No significant degenerative spinal  canal stenosis. Bony retropulsion at the level of the T11 superior  endplate results in mild spinal canal narrowing (without spinal cord  mass effect). No compressive foraminal stenosis.   IMPRESSION:  Redemonstrated chronic T11 vertebral compression fracture with  unchanged 80% height loss. 4 mm bony retropulsion at the level of  the T11 superior endplate with resultant mild spinal canal narrowing  (without spinal cord mass effect).   Unchanged mild chronic  superior endplate compression deformities at  T1, T4 and T5.   Mild thoracic spondylosis without degenerative spinal canal stenosis  or compressive foraminal narrowing.   Electronically Signed    By: Kellie Simmering DO   Impression: Closed compression fracture of body of L1 vertebra (CMS-HCC) [S32.010A] Acute Closed compression fracture of body of L1 vertebra (CMS-HCC) (primary encounter diagnosis) Chronic T11 compression fracture  Plan:  35. 80 year old male with acute L1 compression fracture. Pain is moderate to severe and mobility is limited. Is given a refill of tramadol. MRI confirmed acute L1 compression fracture with multiple chronic compression fractures. Risks, benefits, complications of a L1 kyphoplasty have been discussed with the patient. Patient has agreed and consented procedure with Dr. Hessie Knows.  This note was generated in part with voice recognition software and I apologize for any typographical errors that were not detected and corrected.  Feliberto Gottron MPA-C   Electronically signed by Feliberto Gottron, PA at 08/26/2020 11:18 AM EDT  Reviewed  H+P. No changes noted.

## 2020-08-31 NOTE — Op Note (Signed)
08/31/2020  3:27 PM  PATIENT:  Casey Acevedo   MRN: [id number]   PRE-OPERATIVE DIAGNOSIS:  closed wedge compression fracture of L1   POST-OPERATIVE DIAGNOSIS:  closed wedge compression fracture of L1   PROCEDURE:  Procedure(s): KYPHOPLASTY L1  SURGEON: Laurene Footman, MD   ASSISTANTS: None   ANESTHESIA:   local and MAC   EBL:  No intake/output data recorded.   BLOOD ADMINISTERED:none   DRAINS: none    LOCAL MEDICATIONS USED:  MARCAINE    and XYLOCAINE    SPECIMEN: L1 vertebral body biopsy   DISPOSITION OF SPECIMEN: Pathology   COUNTS:  YES   TOURNIQUET:  * No tourniquets in log *   IMPLANTS: Bone cement   DICTATION: .Dragon Dictation  patient was brought to the operating room and after adequate anesthesia was obtained the patient was placed prone.  C arm was brought in in good visualization of the affected level obtained on both AP and lateral projections.  After patient identification and timeout procedures were completed, local anesthetic was infiltrated with 10 cc 1% Xylocaine infiltrated subcutaneously.  This is done the area on the each side of the planned approach.  The back was then prepped and draped in the usual sterile manner and repeat timeout procedure carried out.  A spinal needle was brought down to the pedicle on the each side of L1 and a 50-50 mix of 1% Xylocaine half percent Sensorcaine with epinephrine total of 20 cc injected on each side.  After allowing this to set a small incision was made and the trocar was advanced into the vertebral body in an extrapedicular fashion.  Biopsy was obtained drilling was carried out balloon inserted with inflation to 4-1/2 cc on the right and 4 cc on the left.  When the cement was appropriate consistency 4-1/2 cc were injected on the right and 4-1/2 cc on the left into the vertebral body without extravasation, good fill superior to inferior endplates and from right to left sides along the inferior endplate.  After the cement  had set the trochar was removed and permanent C-arm views obtained.  The wound was closed with Dermabond followed by Band-Aid   PLAN OF CARE: Discharge home after recovery room   PATIENT DISPOSITION:  PACU - hemodynamically stable.

## 2020-08-31 NOTE — Anesthesia Postprocedure Evaluation (Signed)
Anesthesia Post Note  Patient: Casey Acevedo  Procedure(s) Performed: L1 KYPHOPLASTY  Patient location during evaluation: PACU Anesthesia Type: General Level of consciousness: awake and alert Pain management: pain level controlled Vital Signs Assessment: post-procedure vital signs reviewed and stable Respiratory status: spontaneous breathing, nonlabored ventilation, respiratory function stable and patient connected to nasal cannula oxygen Cardiovascular status: blood pressure returned to baseline and stable Postop Assessment: no apparent nausea or vomiting Anesthetic complications: no   No notable events documented.   Last Vitals:  Vitals:   08/31/20 1530 08/31/20 1545  BP: 119/68 133/74  Pulse: 99 96  Resp: (!) 21 (!) 24  Temp:  (!) 36.3 C  SpO2: 100% 98%    Last Pain:  Vitals:   08/31/20 1545  TempSrc:   PainSc: 6         RLE Motor Response: Purposeful movement (08/31/20 1545)        Arita Miss

## 2020-09-01 ENCOUNTER — Encounter: Payer: Self-pay | Admitting: Orthopedic Surgery

## 2020-09-02 LAB — SURGICAL PATHOLOGY

## 2020-09-27 ENCOUNTER — Other Ambulatory Visit: Payer: Self-pay | Admitting: Student

## 2020-09-27 DIAGNOSIS — S22080A Wedge compression fracture of T11-T12 vertebra, initial encounter for closed fracture: Secondary | ICD-10-CM

## 2020-09-29 ENCOUNTER — Ambulatory Visit
Admission: RE | Admit: 2020-09-29 | Discharge: 2020-09-29 | Disposition: A | Discharge status: Home / Self Care | Payer: Medicare Other | Source: Ambulatory Visit | Attending: Student | Admitting: Student

## 2020-09-29 ENCOUNTER — Other Ambulatory Visit: Payer: Self-pay

## 2020-09-29 DIAGNOSIS — S22080A Wedge compression fracture of T11-T12 vertebra, initial encounter for closed fracture: Secondary | ICD-10-CM | POA: Diagnosis not present

## 2020-09-29 IMAGING — MR MR THORACIC SPINE W/O CM
5 of 6 series · 30 of 48 positions shown · non-contrast
Comparison: Lumbar spine radiographs [DATE], CTs of the chest
and abdomen [DATE] and MRIs of the thoracolumbar spine
[DATE].

CLINICAL DATA: Kyphoplasty 4 weeks ago at L1 ([DATE]). Pain for
4 weeks.

EXAM:
MRI THORACIC SPINE WITHOUT CONTRAST
TECHNIQUE: Multiplanar, multisequence MR imaging of the thoracic spine was
performed. No intravenous contrast was administered.

[Series 18: T1 · sagittal · 6.0mm · 1.41mm/px · 4 of 12 slices shown (1 of 2)]
[im 1/12]
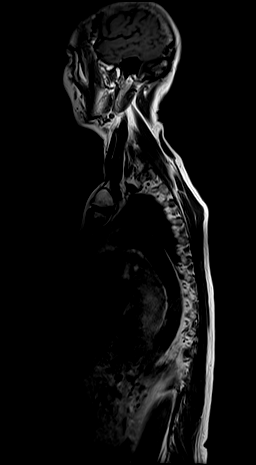
[im 4/12]
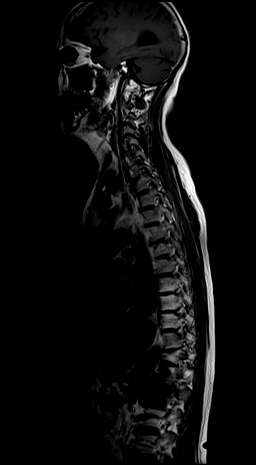
[im 8/12]
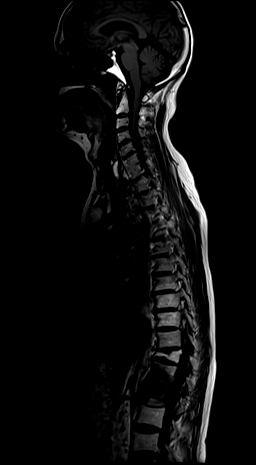
[im 12/12]
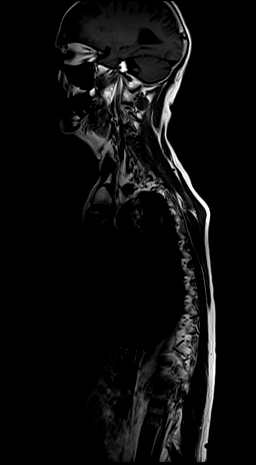

[Series 19: T2 · sagittal · 3.0mm · 1.06mm/px · 6 of 21 slices shown (1 of 2)]
[im 1/21]
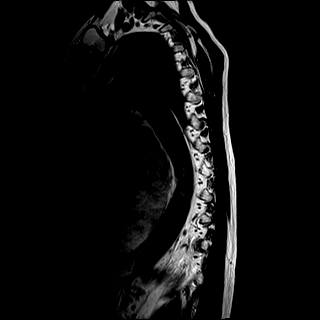
[im 5/21]
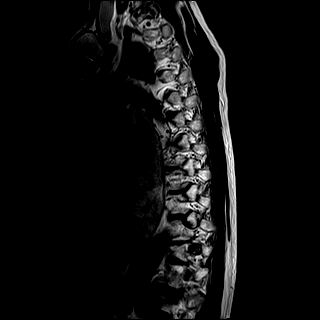
[im 9/21]
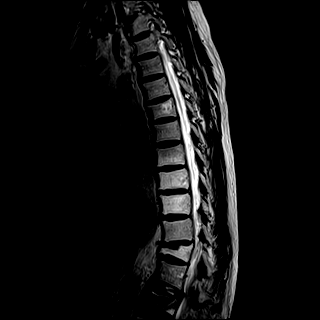
[im 13/21]
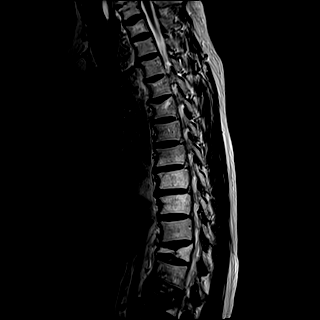
[im 17/21]
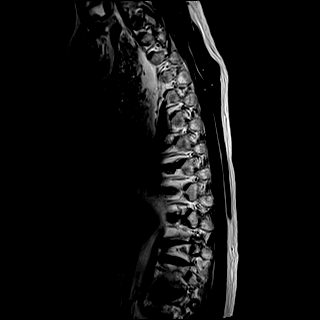
[im 21/21]
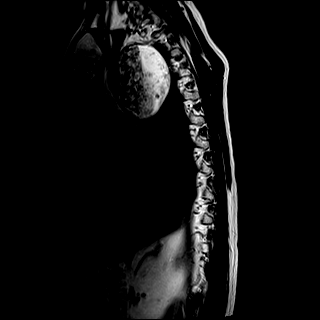

[Series 20: T1 · sagittal · 3.0mm · 1.06mm/px · 6 of 21 slices shown (2 of 2)]
[im 1/21]
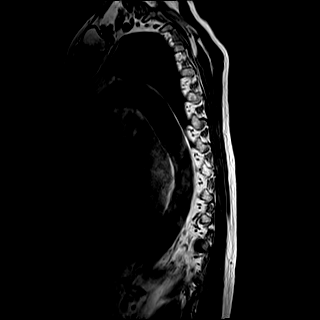
[im 5/21]
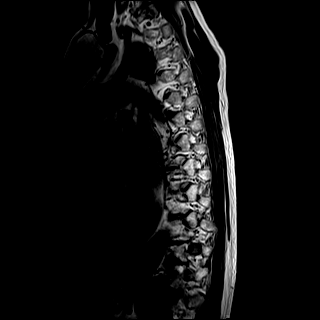
[im 9/21]
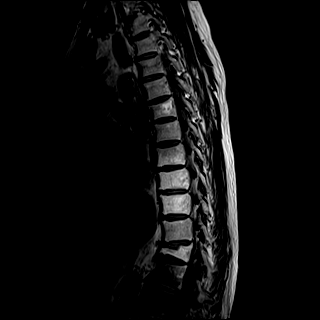
[im 13/21]
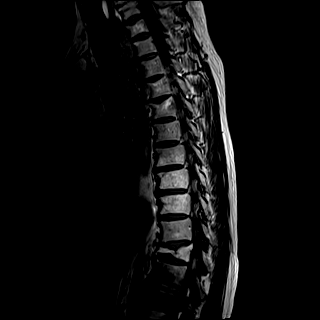
[im 17/21]
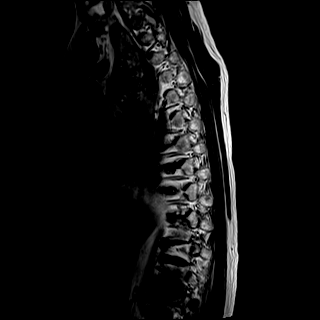
[im 21/21]
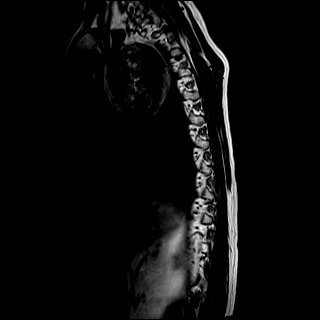

[Series 21: STIR · sagittal · 3.0mm · 0.53mm/px · 5 of 21 slices shown]
[im 1/21]
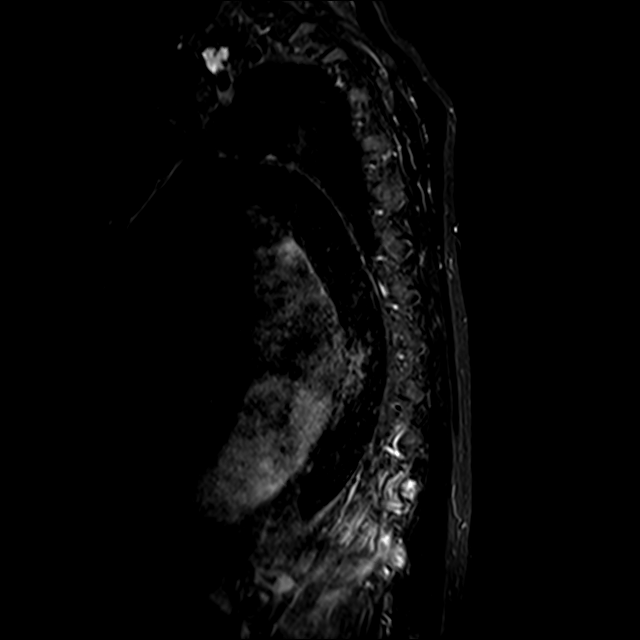
[im 5/21]
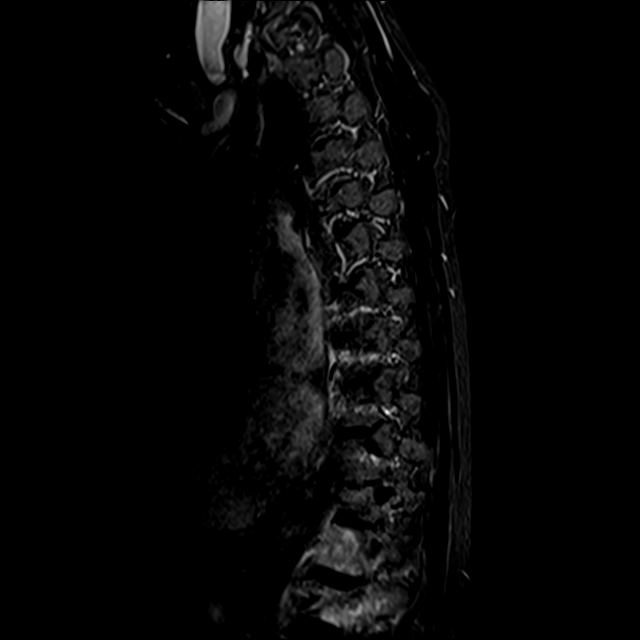
[im 9/21]
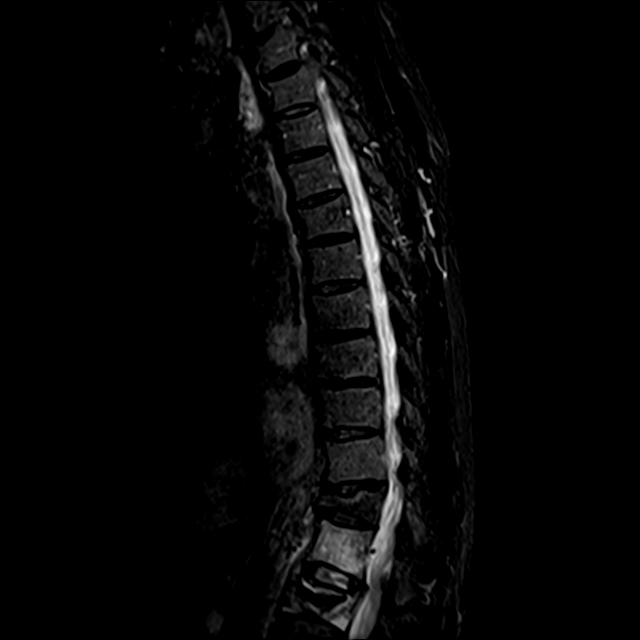
[im 13/21]
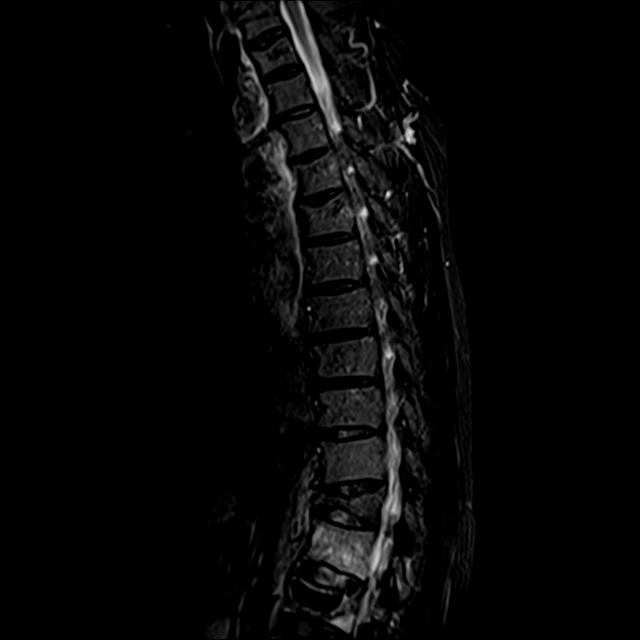
[im 17/21]
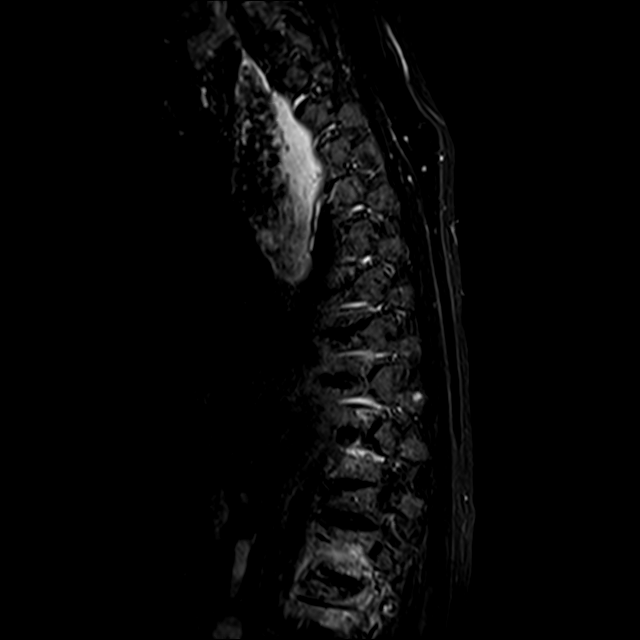

[Series 22: T2 · axial · 4.0mm · 0.59mm/px · z∈[-273,-6]mm · 9 of 45 slices shown (2 of 2)]
[im 1/45]
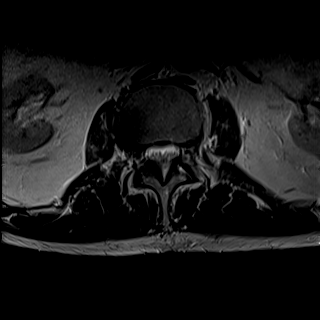
[im 8/45]
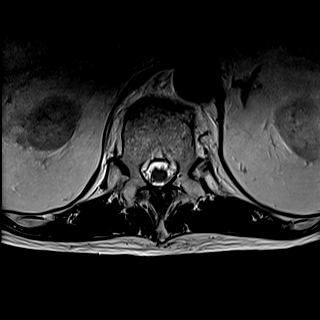
[im 15/45]
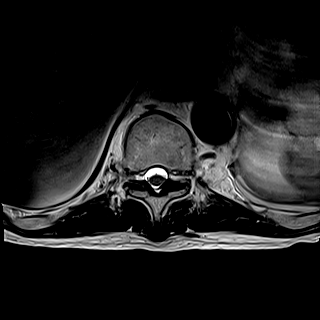
[im 19/45]
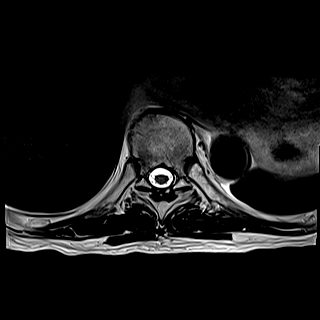
[im 23/45]
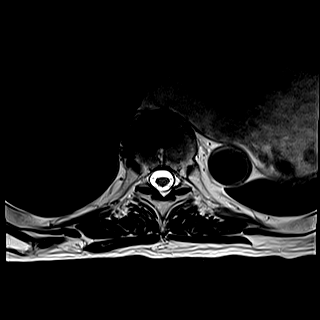
[im 26/45]
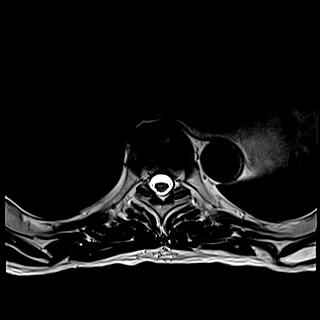
[im 30/45]
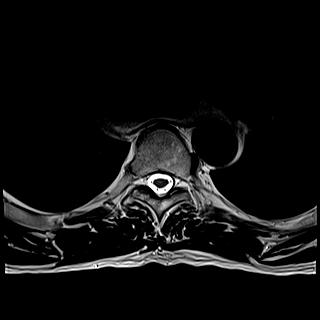
[im 37/45]
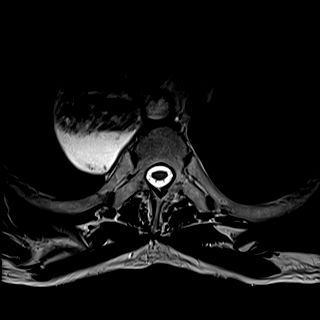
[im 45/45]
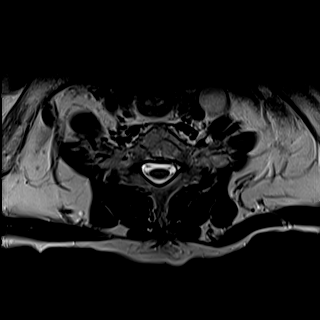

[30 of 48 positions shown; findings below may reference images not displayed]

FINDINGS: Alignment:  Physiologic.

Vertebrae: Interval spinal augmentation at L1 with mildly
progressive loss of vertebral body height. Osseous retropulsion at
L1 has mildly progressed now approximately 5 mm. There is a new mild
inferior endplate compression fracture at T12 resulting in less than
10% loss of vertebral body height and no osseous retropulsion. There
is bone marrow edema throughout the T12 vertebral body. Stable
chronic compression deformities at T11, T5, T4 and T1. No other
acute osseous findings.

Cord:  The thoracic cord appears normal in signal and caliber.

Paraspinal and other soft tissues: Mild paraspinal inflammatory
changes/hemorrhage at T12 and L1 without focal fluid collection. No
epidural fluid collection. Stable postsurgical changes related to
reported esophagectomy and gastric pull-through.

Disc levels:

Stable mild multilevel spondylosis with small disc protrusions at
T4-5 and T5-6. Stable osseous retropulsion at T11 and minimally
progressive osseous retropulsion at L1. No cord deformity or
significant foraminal narrowing.
IMPRESSION: 1. Interval spinal augmentation at L1 with mildly progressive loss
of vertebral body height and osseous retropulsion.
2. New mild inferior endplate compression fracture at T12.
3. Multiple other chronic thoracic compression deformities are
unchanged.
4. Stable mild spondylosis without significant spinal stenosis or
nerve root encroachment.

## 2020-09-30 ENCOUNTER — Other Ambulatory Visit: Payer: Self-pay | Admitting: Orthopedic Surgery

## 2020-10-04 ENCOUNTER — Inpatient Hospital Stay
Admission: RE | Admit: 2020-10-04 | Discharge: 2020-10-04 | Disposition: A | Discharge status: Home / Self Care | Payer: Medicare Other | Source: Ambulatory Visit

## 2020-10-05 ENCOUNTER — Encounter: Payer: Self-pay | Admitting: Orthopedic Surgery

## 2020-10-05 ENCOUNTER — Ambulatory Visit
Admission: RE | Admit: 2020-10-05 | Discharge: 2020-10-05 | Disposition: A | Discharge status: Home / Self Care | Payer: Medicare Other | Attending: Orthopedic Surgery | Admitting: Orthopedic Surgery

## 2020-10-05 ENCOUNTER — Other Ambulatory Visit: Payer: Self-pay

## 2020-10-05 ENCOUNTER — Encounter
Admission: RE | Disposition: A | Discharge status: Home / Self Care | Payer: Self-pay | Source: Home / Self Care | Attending: Orthopedic Surgery

## 2020-10-05 ENCOUNTER — Ambulatory Visit: Payer: Medicare Other

## 2020-10-05 ENCOUNTER — Ambulatory Visit: Payer: Medicare Other | Admitting: Anesthesiology

## 2020-10-05 DIAGNOSIS — Z9221 Personal history of antineoplastic chemotherapy: Secondary | ICD-10-CM | POA: Diagnosis not present

## 2020-10-05 DIAGNOSIS — S22080A Wedge compression fracture of T11-T12 vertebra, initial encounter for closed fracture: Secondary | ICD-10-CM | POA: Insufficient documentation

## 2020-10-05 DIAGNOSIS — M47814 Spondylosis without myelopathy or radiculopathy, thoracic region: Secondary | ICD-10-CM | POA: Diagnosis not present

## 2020-10-05 DIAGNOSIS — M5124 Other intervertebral disc displacement, thoracic region: Secondary | ICD-10-CM | POA: Diagnosis not present

## 2020-10-05 DIAGNOSIS — Z419 Encounter for procedure for purposes other than remedying health state, unspecified: Secondary | ICD-10-CM

## 2020-10-05 DIAGNOSIS — Z923 Personal history of irradiation: Secondary | ICD-10-CM | POA: Insufficient documentation

## 2020-10-05 DIAGNOSIS — Z8501 Personal history of malignant neoplasm of esophagus: Secondary | ICD-10-CM | POA: Insufficient documentation

## 2020-10-05 DIAGNOSIS — X58XXXA Exposure to other specified factors, initial encounter: Secondary | ICD-10-CM | POA: Diagnosis not present

## 2020-10-05 HISTORY — PX: KYPHOPLASTY: SHX5884

## 2020-10-05 IMAGING — XA DG THORACIC SPINE 2V
4 series · 6 of 6 positions shown · non-contrast
Comparison: Thoracic spine MRI [DATE]. CT chest/abdomen/pelvis
[DATE].

CLINICAL DATA: Surgery, elective [02] ([02]-CM). Additional
history provided: Kyphoplasty. Provided fluoroscopy time 1 minutes,
40 seconds. Additional history obtained from the electronic medical
record: T12 kyphoplasty.

EXAM:
THORACIC SPINE 2 VIEWS; DG C-ARM 1-60 MIN

[Series 1: dg x-ray · 0.14mm/px · 3 of 3 slices shown]
[im 1/3]
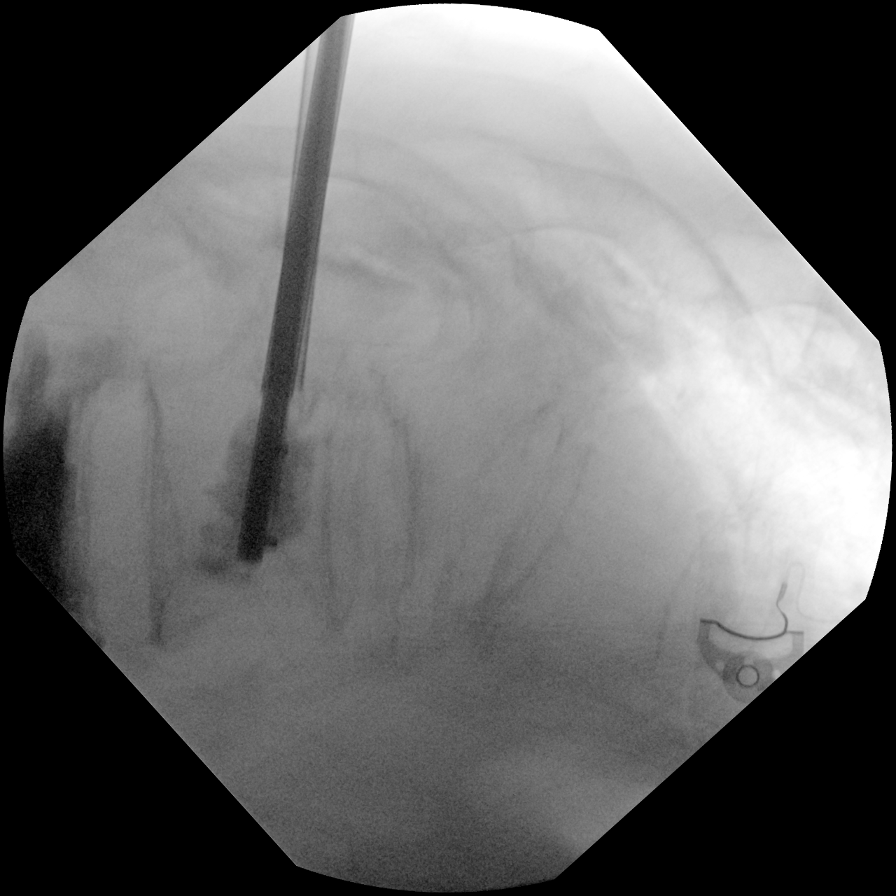
[im 2/3]
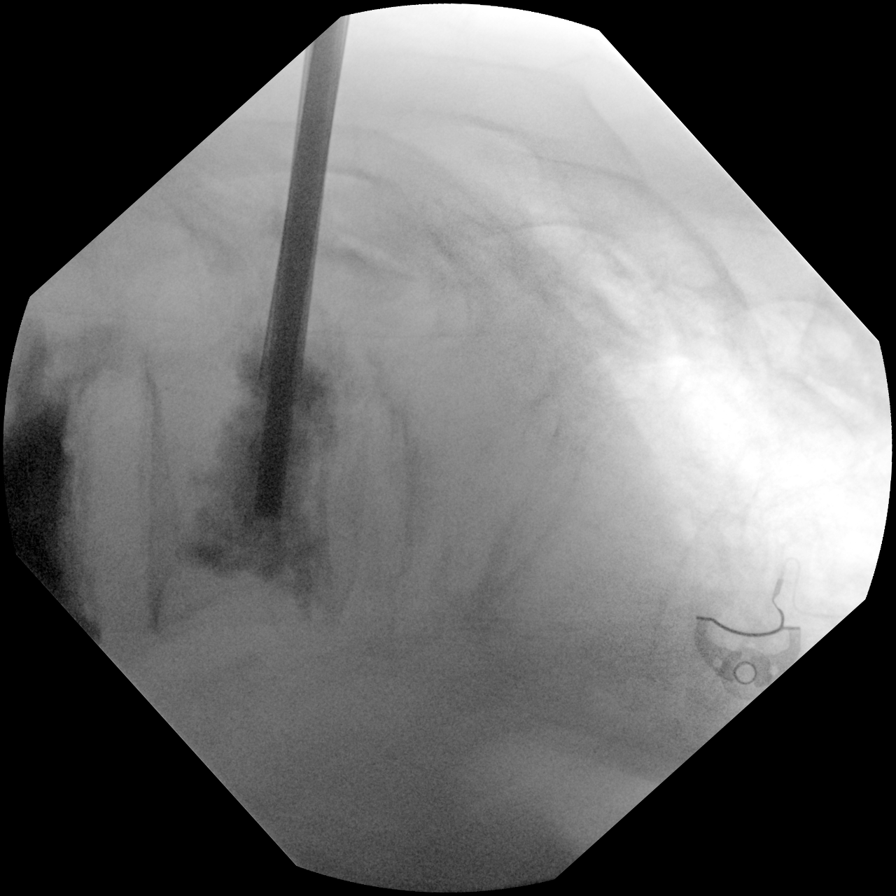
[im 3/3]
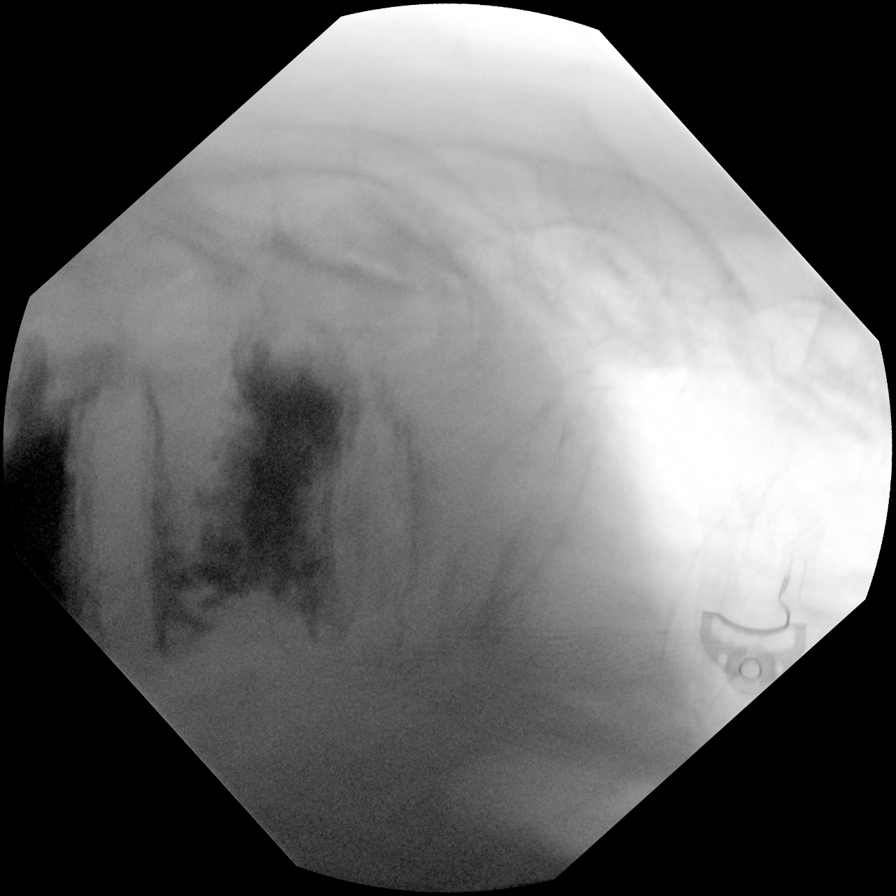

[Series 29: ortho standard · 1 of 1 slices shown (1 of 3)]
[im 1/1]
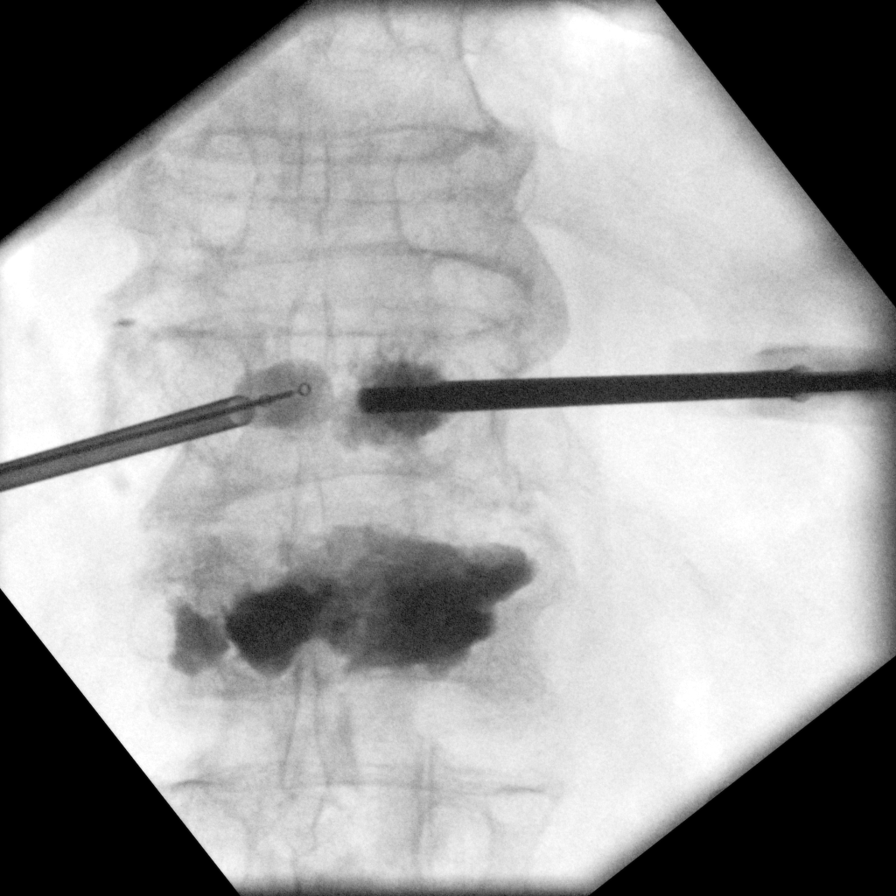

[Series 30: ortho standard · 1 of 1 slices shown (2 of 3)]
[im 1/1]
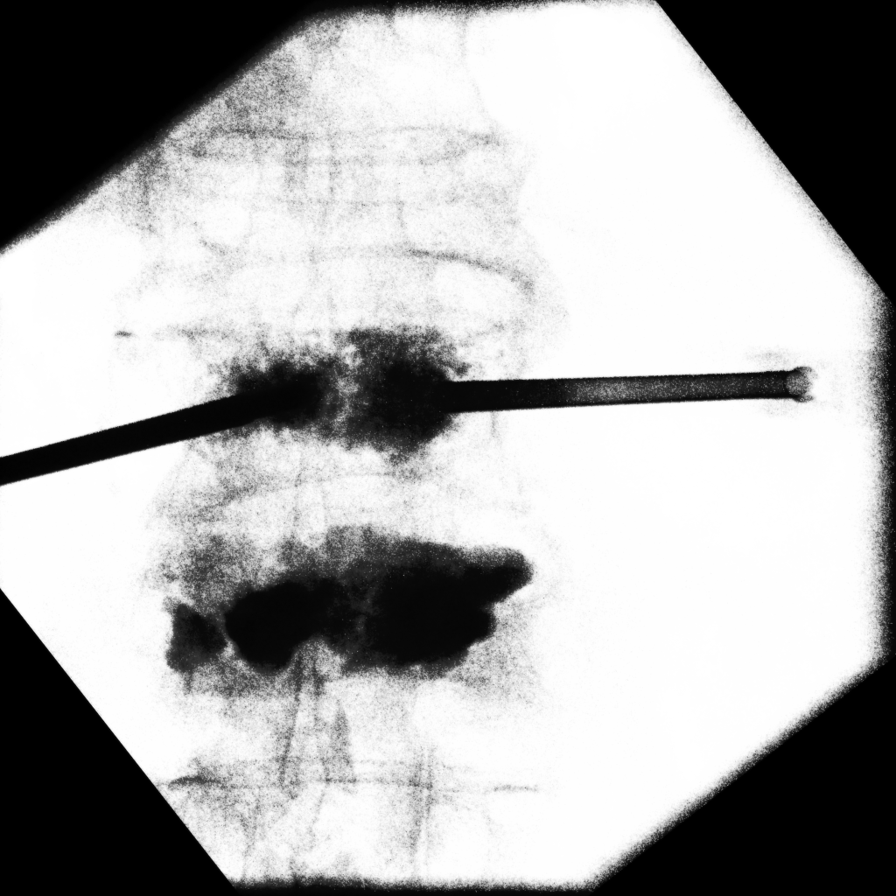

[Series 31: ortho standard · 1 of 1 slices shown (3 of 3)]
[im 1/1]
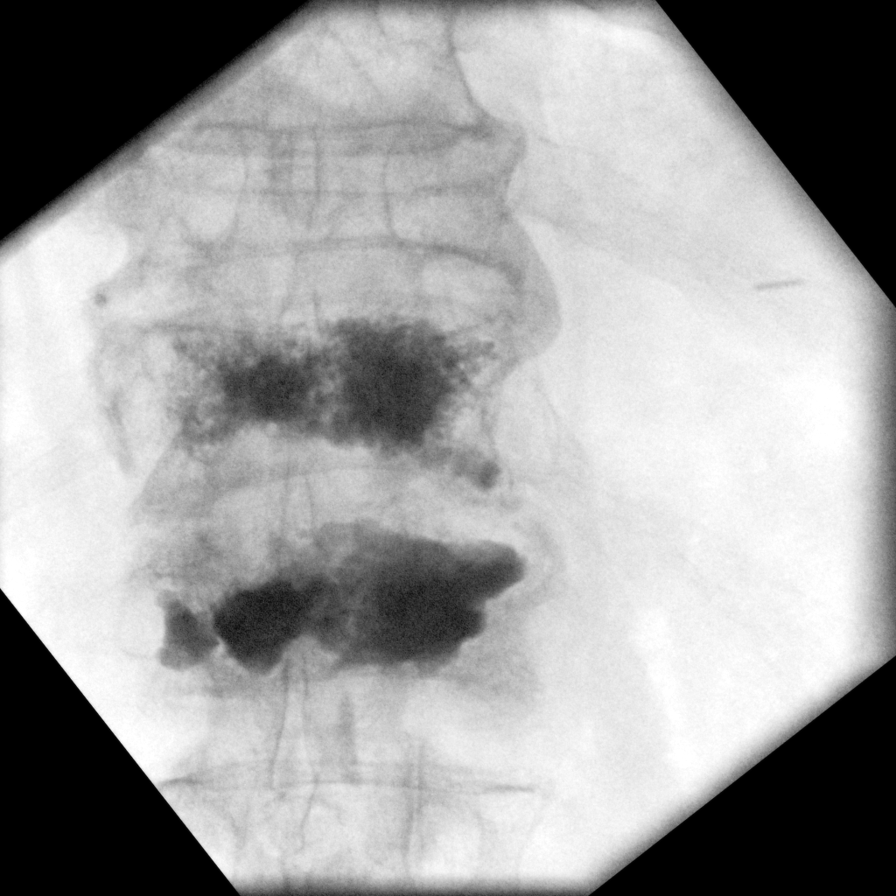

[6 of 6 positions shown; findings below may reference images not displayed]

FINDINGS: AP and lateral view intraoperative fluoroscopic images of the
thoracolumbar spine are submitted, 6 images total. On the provided
images, trocars and kyphoplasty material are present within the T12
vertebral body (at site of a known compression fracture).
Redemonstrated L1 compression fracture status post augmentation.
IMPRESSION: Six intraoperative fluoroscopic images of the thoracolumbar spine
from reported T12 kyphoplasty, as described.

## 2020-10-05 SURGERY — KYPHOPLASTY
Anesthesia: General

## 2020-10-05 MED ORDER — OXYCODONE HCL 5 MG PO TABS
ORAL_TABLET | ORAL | Status: AC
  Administered 2020-10-05: 5 mg via ORAL
  Filled 2020-10-05: qty 1

## 2020-10-05 MED ORDER — LIDOCAINE HCL (PF) 1 % IJ SOLN
INTRAMUSCULAR | Status: AC
  Filled 2020-10-05: qty 30

## 2020-10-05 MED ORDER — FENTANYL CITRATE (PF) 100 MCG/2ML IJ SOLN
INTRAMUSCULAR | Status: DC | PRN
  Administered 2020-10-05: 50 ug via INTRAVENOUS

## 2020-10-05 MED ORDER — OXYCODONE HCL 5 MG/5ML PO SOLN: 5.0000 mg | Freq: Once | ORAL | Status: AC | PRN

## 2020-10-05 MED ORDER — DEXMEDETOMIDINE (PRECEDEX) IN NS 20 MCG/5ML (4 MCG/ML) IV SYRINGE
PREFILLED_SYRINGE | INTRAVENOUS | Status: AC
  Filled 2020-10-05: qty 5

## 2020-10-05 MED ORDER — ACETAMINOPHEN 10 MG/ML IV SOLN
INTRAVENOUS | Status: AC
  Filled 2020-10-05: qty 100

## 2020-10-05 MED ORDER — FENTANYL CITRATE (PF) 100 MCG/2ML IJ SOLN: 25.0000 ug | INTRAMUSCULAR | Status: DC | PRN

## 2020-10-05 MED ORDER — FENTANYL CITRATE (PF) 100 MCG/2ML IJ SOLN
INTRAMUSCULAR | Status: AC
  Filled 2020-10-05: qty 2

## 2020-10-05 MED ORDER — KETAMINE HCL 50 MG/5ML IJ SOSY
PREFILLED_SYRINGE | INTRAMUSCULAR | Status: AC
  Filled 2020-10-05: qty 5

## 2020-10-05 MED ORDER — PROPOFOL 500 MG/50ML IV EMUL
INTRAVENOUS | Status: DC | PRN
  Administered 2020-10-05: 25 ug/kg/min via INTRAVENOUS

## 2020-10-05 MED ORDER — DEXMEDETOMIDINE HCL IN NACL 200 MCG/50ML IV SOLN
INTRAVENOUS | Status: DC | PRN
  Administered 2020-10-05: 8 ug via INTRAVENOUS

## 2020-10-05 MED ORDER — OXYCODONE-ACETAMINOPHEN 5-325 MG PO TABS: 1.0000 | ORAL_TABLET | ORAL | 0 refills | Status: DC | PRN

## 2020-10-05 MED ORDER — CEFAZOLIN SODIUM-DEXTROSE 2-4 GM/100ML-% IV SOLN
2.0000 g | INTRAVENOUS | Status: AC
Start: 2020-10-06 — End: 2020-10-05
  Administered 2020-10-05: 2 g via INTRAVENOUS

## 2020-10-05 MED ORDER — IOHEXOL 180 MG/ML  SOLN
INTRAMUSCULAR | Status: DC | PRN
  Administered 2020-10-05: 10 mL

## 2020-10-05 MED ORDER — OXYCODONE HCL 5 MG PO TABS
5.0000 mg | ORAL_TABLET | Freq: Once | ORAL | Status: AC | PRN
Start: 2020-10-05 — End: 2020-10-05

## 2020-10-05 MED ORDER — CHLORHEXIDINE GLUCONATE 0.12 % MT SOLN: 15.0000 mL | Freq: Once | OROMUCOSAL | Status: DC

## 2020-10-05 MED ORDER — CHLORHEXIDINE GLUCONATE 0.12 % MT SOLN
OROMUCOSAL | Status: AC
  Filled 2020-10-05: qty 15

## 2020-10-05 MED ORDER — ORAL CARE MOUTH RINSE: 15.0000 mL | Freq: Once | OROMUCOSAL | Status: DC

## 2020-10-05 MED ORDER — KETAMINE HCL 10 MG/ML IJ SOLN
INTRAMUSCULAR | Status: DC | PRN
  Administered 2020-10-05 (×3): 10 mg via INTRAVENOUS

## 2020-10-05 MED ORDER — CEFAZOLIN SODIUM-DEXTROSE 2-4 GM/100ML-% IV SOLN
INTRAVENOUS | Status: AC
  Filled 2020-10-05: qty 100

## 2020-10-05 MED ORDER — BUPIVACAINE-EPINEPHRINE (PF) 0.5% -1:200000 IJ SOLN
INTRAMUSCULAR | Status: AC
  Filled 2020-10-05: qty 30

## 2020-10-05 MED ORDER — PHENYLEPHRINE HCL (PRESSORS) 10 MG/ML IV SOLN
INTRAVENOUS | Status: AC
  Filled 2020-10-05: qty 1

## 2020-10-05 MED ORDER — MIDAZOLAM HCL 2 MG/2ML IJ SOLN
INTRAMUSCULAR | Status: AC
  Filled 2020-10-05: qty 2

## 2020-10-05 MED ORDER — MIDAZOLAM HCL 2 MG/2ML IJ SOLN
INTRAMUSCULAR | Status: DC | PRN
Start: 2020-10-05 — End: 2020-10-05
  Administered 2020-10-05: .5 mg via INTRAVENOUS

## 2020-10-05 MED ORDER — LACTATED RINGERS IV SOLN: INTRAVENOUS | Status: DC

## 2020-10-05 MED ORDER — BUPIVACAINE-EPINEPHRINE (PF) 0.5% -1:200000 IJ SOLN
INTRAMUSCULAR | Status: DC | PRN
  Administered 2020-10-05: 10 mL

## 2020-10-05 MED ORDER — LIDOCAINE HCL 1 % IJ SOLN
INTRAMUSCULAR | Status: DC | PRN
  Administered 2020-10-05 (×2): 10 mL

## 2020-10-05 SURGICAL SUPPLY — 22 items
ADH SKN CLS APL DERMABOND .7 (GAUZE/BANDAGES/DRESSINGS) ×1
CEMENT KYPHON CX01A KIT/MIXER (Cement) ×2 IMPLANT
DERMABOND ADVANCED (GAUZE/BANDAGES/DRESSINGS) ×1
DERMABOND ADVANCED .7 DNX12 (GAUZE/BANDAGES/DRESSINGS) ×1 IMPLANT
DEVICE BIOPSY BONE KYPHX (INSTRUMENTS) ×2 IMPLANT
DRAPE C-ARM XRAY 36X54 (DRAPES) ×2 IMPLANT
DURAPREP 26ML APPLICATOR (WOUND CARE) ×2 IMPLANT
FEE RENTAL RFA GENERATOR (MISCELLANEOUS) IMPLANT
GAUZE 4X4 16PLY ~~LOC~~+RFID DBL (SPONGE) ×2 IMPLANT
GLOVE SURG SYN 9.0  PF PI (GLOVE) ×2
GLOVE SURG SYN 9.0 PF PI (GLOVE) ×1 IMPLANT
GOWN SRG 2XL LVL 4 RGLN SLV (GOWNS) ×1 IMPLANT
GOWN STRL NON-REIN 2XL LVL4 (GOWNS) ×2
GOWN STRL REUS W/ TWL LRG LVL3 (GOWN DISPOSABLE) ×1 IMPLANT
GOWN STRL REUS W/TWL LRG LVL3 (GOWN DISPOSABLE) ×2
MANIFOLD NEPTUNE II (INSTRUMENTS) ×1 IMPLANT
PACK KYPHOPLASTY (MISCELLANEOUS) ×2 IMPLANT
RENTAL RFA GENERATOR (MISCELLANEOUS) IMPLANT
STRAP SAFETY 5IN WIDE (MISCELLANEOUS) ×2 IMPLANT
SWABSTK COMLB BENZOIN TINCTURE (MISCELLANEOUS) ×1 IMPLANT
TRAY KYPHOPAK 15/3 EXPRESS 1ST (MISCELLANEOUS) ×3 IMPLANT
TRAY KYPHOPAK 20/3 EXPRESS 1ST (MISCELLANEOUS) ×1 IMPLANT

## 2020-10-05 NOTE — Anesthesia Preprocedure Evaluation (Signed)
Anesthesia Evaluation  Patient identified by MRN, date of birth, ID band Patient awake    Reviewed: Allergy & Precautions, H&P , NPO status , Patient's Chart, lab work & pertinent test results  History of Anesthesia Complications Negative for: history of anesthetic complications  Airway Mallampati: III  TM Distance: >3 FB Neck ROM: limited  Mouth opening: Limited Mouth Opening Comment: No stiffness or rigidity of neck tissue Dental no notable dental hx. (+) Chipped   Pulmonary neg pulmonary ROS, neg sleep apnea, neg COPD,    Pulmonary exam normal        Cardiovascular Exercise Tolerance: Good (-) angina+ CAD  (-) Past MI and (-) Cardiac Stents Normal cardiovascular exam(-) dysrhythmias      Neuro/Psych negative neurological ROS  negative psych ROS   GI/Hepatic Neg liver ROS, GERD  Medicated and Controlled,S/p esophagectomy for esophageal cancer, treated with radiation as well.   Endo/Other  negative endocrine ROS  Renal/GU      Musculoskeletal   Abdominal   Peds  Hematology negative hematology ROS (+)   Anesthesia Other Findings Past Medical History: No date: Dysphagia 2018: Esophageal cancer ([place])     Comment:  Chemo + Rad tx's with surgical resection.   Past Surgical History: No date: CHOLECYSTECTOMY 06/18/2017: COMPLETE ESOPHAGECTOMY; N/A     Comment:  Procedure: cervical ESOPHAGECTOMY COMPLETE;  Surgeon:               Grace Isaac, MD;  Location: [facility] OR;  Service:               Thoracic;  Laterality: N/A;  NEED NIMS ET TUBE 12/07/2016: ESOPHAGOGASTRODUODENOSCOPY (EGD) WITH PROPOFOL; N/A     Comment:  Procedure: ESOPHAGOGASTRODUODENOSCOPY (EGD) WITH               PROPOFOL;  Surgeon: Jonathon Bellows, MD;  Location: [facility]               ENDOSCOPY;  Service: Gastroenterology;  Laterality: N/A; 12/26/2016: ESOPHAGOGASTRODUODENOSCOPY (EGD) WITH PROPOFOL; N/A     Comment:  Procedure: ESOPHAGOGASTRODUODENOSCOPY  (EGD) WITH               PROPOFOL WITH DILATION;  Surgeon: Jonathon Bellows, MD;                Location: [facility] ENDOSCOPY;  Service: Gastroenterology;                Laterality: N/A; No date: EYE SURGERY 01/16/2017: GASTROJEJUNOSTOMY; N/A     Comment:  Procedure: LAPROSCOPIC ASSIST FEEDING JEJUNOSTOMY TUBE;               Surgeon: Johnathan Hausen, MD;  Location: [facility] ORS;                Service: General;  Laterality: N/A; 01/10/2017: IR FLUORO GUIDE PORT INSERTION RIGHT 03/22/2018: IR REMOVAL TUN ACCESS W/ PORT W/O FL MOD SED 03/15/2017: IR REPLACE G-TUBE SIMPLE WO FLUORO 03/05/2017: IR REPLC DUODEN/JEJUNO TUBE PERCUT W/FLUORO No date: PICC LINE INSERTION; Right 06/18/2017: Edwena Blow; N/A     Comment:  Procedure: PYLOROMYOTOMY;  Surgeon: Grace Isaac,               MD;  Location: [place] OR;  Service: Thoracic;  Laterality:               N/A; 06/18/2017: VIDEO BRONCHOSCOPY; N/A     Comment:  Procedure: VIDEO BRONCHOSCOPY;  Surgeon: Servando Snare,  Lilia Argue, MD;  Location: [facility] OR;  Service: Thoracic;                Laterality: N/A;  BMI    Body Mass Index: 25.10 kg/m      Reproductive/Obstetrics negative OB ROS                             Anesthesia Physical  Anesthesia Plan  ASA: 3  Anesthesia Plan: General   Post-op Pain Management:    Induction: Intravenous  PONV Risk Score and Plan: 2 and Ondansetron, Dexamethasone, Treatment may vary due to age or medical condition and TIVA  Airway Management Planned: Nasal Cannula and Natural Airway  Additional Equipment: None  Intra-op Plan:   Post-operative Plan:   Informed Consent: I have reviewed the patients History and Physical, chart, labs and discussed the procedure including the risks, benefits and alternatives for the proposed anesthesia with the patient or authorized representative who has indicated his/her understanding and acceptance.     Dental Advisory Given  Plan Discussed with:  Anesthesiologist, CRNA and Surgeon  Anesthesia Plan Comments: (Patient consented for risks of anesthesia including but not limited to:  - adverse reactions to medications - risk of airway placement if required - damage to eyes, teeth, lips or other oral mucosa - nerve damage due to positioning  - sore throat or hoarseness - Damage to heart, brain, nerves, lungs, other parts of body or loss of life  Patient voiced understanding.)        Anesthesia Quick Evaluation

## 2020-10-05 NOTE — H&P (Signed)
Chief Complaint  Patient presents with   Lower Back - Pain, Follow-up   Casey Acevedo is a 80 y.o. male who presents today for ration of acute onset low back pain. The patient is status post a L1 kyphoplasty performed on 08/31/2020. The patient was doing extremely well and states that his pain completely went away after the kyphoplasty except for a mild ache. However over the past several days he reports increased discomfort around the site of his previous kyphoplasty. He denies any trauma or injury affecting the back, he denies any numbness or tingling to bilateral lower extremities. Pain is just above where he underwent his previous kyphoplasty. He is concerned that he may have suffered a new vertebral body compression fracture. He reports a 10 out of 10 pain score. He denies any changes to his medical history since he was last evaluated. He denies any personal history of heart attack, stroke, asthma or COPD. No personal history of blood clots.  Past Medical History: Past Medical History:  Diagnosis Date   GERD (gastroesophageal reflux disease)   History of cancer 2018   History of cataract 2005   Past Surgical History: Past Surgical History:  Procedure Laterality Date   CATARACT EXTRACTION 2005   CHOLECYSTECTOMY   ESOPHAGECTOMY   Past Family History: Family History  Problem Relation Age of Onset   Heart failure Mother   Heart failure Father   Heart failure Brother   Medications: Current Outpatient Medications Ordered in Epic  Medication Sig Dispense Refill   multivitamin tablet Take 1 tablet by mouth once daily.   ondansetron (ZOFRAN-ODT) 4 MG disintegrating tablet Take 4 mg by mouth every 8 (eight) hours as needed   menthol 5 % Gel Apply topically (Patient not taking: Reported on 09/27/2020)   multivitamin with minerals tablet Take 1 tablet by mouth once daily (Patient not taking: Reported on 09/27/2020)   oxyCODONE-acetaminophen (PERCOCET) 5-325 mg tablet Take 1 tablet by mouth  every 8 (eight) hours as needed for Pain 15 tablet 0   predniSONE (DELTASONE) 10 MG tablet Taper 6-6-4-4-2-2-1-1-off (Patient not taking: Reported on 09/27/2020) 26 tablet 0   traMADoL (ULTRAM) 50 mg tablet Take 1 tablet (50 mg total) by mouth every 6 (six) hours as needed for Pain (Patient not taking: Reported on 09/27/2020) 30 tablet 0   No current Epic-ordered facility-administered medications on file.   Allergies: Allergies  Allergen Reactions   Tramadol Hallucination  Pt states that it made crazy.    Review of Systems:  A comprehensive 14 point ROS was performed, reviewed by me today, and the pertinent orthopaedic findings are documented in the HPI.  Exam: BP 132/74  Ht 172.7 cm ('5\' 8"'$ )  Wt 70.3 kg (155 lb)  BMI 23.57 kg/m  General/Constitutional: The patient appears to be well-nourished, well-developed, and in no acute distress. Neuro/Psych: Normal mood and affect, oriented to person, place and time. Eyes: Non-icteric. Pupils are equal, round, and reactive to light, and exhibit synchronous movement. ENT: Unremarkable. Lymphatic: No palpable adenopathy. Respiratory: Lungs clear to auscultation, Normal chest excursion, No wheezes and Non-labored breathing Cardiovascular: Regular rate and rhythm. No murmurs. and No edema, swelling or tenderness, except as noted in detailed exam. Integumentary: No impressive skin lesions present, except as noted in detailed exam. Musculoskeletal: Unremarkable, except as noted in detailed exam.  General: Well developed, well nourished 80 y.o. male in no apparent distress. Normal affect. Normal communication. Patient answers questions appropriately. The patient has a normal gait. There is no antalgic component.  There is no hip lurch.   Cranial Nerves: Pupils equal round and reactive to light. Facial tone is symmetric. Facial sensation is symmetric. Shoulder shrug is symmetric. Tongue protrusion is midline. There is no pronator drift.  Skin  examination of the spine demonstrates well-healed previous kyphoplasty incision sites. The patient does have a kyphotic curvature to the thoracic spine. The patient is mildly tender palpation directly over the T12 vertebral body. The patient has limited flexion extension due to pain, limited left and right bend with rotation.  Strength:  Side Iliopsoas Quads Hamstring PF DF EHL  R '5 5 5 5 5 5  '$ L '5 5 5 5 5 5   '$ Reflexes are 2+ and symmetric at the biceps, triceps, brachioradialis, patella and achilles. Bilateral upper and lower extremity sensation is intact to light touch. Clonus is not present. Toes are down-going. Gait is normal. No difficulty with tandem gait. Hoffman's is absent.  Negative bilateral straight leg raise.  Rapid alternating movements are normal.   Imaging: AP, lateral and oblique images of the lumbar spine were obtained today in the office and reviewed by me. These x-rays demonstrate that the patient is status post a kyphoplasty of the L1 vertebral body. There appears to be a chronic T11 compression fracture. There does appear to be new compression of the T12 vertebral body when compared to previous x-rays obtained. No other acute fractures identified. No lytic lesions noted.  MRI THORACIC SPINE WITHOUT CONTRAST   TECHNIQUE:  Multiplanar, multisequence MR imaging of the thoracic spine was  performed. No intravenous contrast was administered.   COMPARISON:  Lumbar spine radiographs 08/31/2020, CTs of the chest  and abdomen 08/29/2020 and MRIs of the thoracolumbar spine  08/25/2020.   FINDINGS:  Alignment:  Physiologic.   Vertebrae: Interval spinal augmentation at L1 with mildly  progressive loss of vertebral body height. Osseous retropulsion at  L1 has mildly progressed now approximately 5 mm. There is a new mild  inferior endplate compression fracture at T12 resulting in less than  10% loss of vertebral body height and no osseous retropulsion. There  is bone marrow  edema throughout the T12 vertebral body. Stable  chronic compression deformities at T11, T5, T4 and T1. No other  acute osseous findings.   Cord:  The thoracic cord appears normal in signal and caliber.   Paraspinal and other soft tissues: Mild paraspinal inflammatory  changes/hemorrhage at T12 and L1 without focal fluid collection. No  epidural fluid collection. Stable postsurgical changes related to  reported esophagectomy and gastric pull-through.   Disc levels:   Stable mild multilevel spondylosis with small disc protrusions at  T4-5 and T5-6. Stable osseous retropulsion at T11 and minimally  progressive osseous retropulsion at L1. No cord deformity or  significant foraminal narrowing.   IMPRESSION:  1. Interval spinal augmentation at L1 with mildly progressive loss  of vertebral body height and osseous retropulsion.  2. New mild inferior endplate compression fracture at T12.  3. Multiple other chronic thoracic compression deformities are  unchanged.  4. Stable mild spondylosis without significant spinal stenosis or  nerve root encroachment.   Electronically Signed    By: Richardean Sale M.D.    On: 09/29/2020 13:18  Impression: Low back pain, unspecified back pain laterality, unspecified chronicity, unspecified whether sciatica present [M54.50] Low back pain, unspecified back pain laterality, unspecified chronicity, unspecified whether sciatica present (primary encounter diagnosis) Closed wedge compression fracture of T12 vertebra, initial encounter (CMS-HCC)  Plan:  1. Treatment options were  discussed today with the patient. 2. The thoracic spine demonstrates evidence of a new compression fracture at the T12 vertebral body. The patient was given a prescription for Percocet to take as needed for discomfort at this time. 3. Discussed the risk and benefits of a T12 kyphoplasty with the patient at today's visit. The patient would like to proceed with surgery at this time. 4.  This document will serve as a surgical history and physical for the patient. Procedure will be performed by Dr. Rudene Christians in the future. 5. The patient will follow-up per standard postop protocol. They can call the clinic they have any questions, new symptoms develop or symptoms worsen.  The procedure was discussed with the patient, as were the potential risks (including bleeding, infection, nerve and/or blood vessel injury, persistent or recurrent pain, failure of the repair, progression of arthritis, need for further surgery, blood clots, strokes, heart attacks and/or arhythmias, pneumonia, etc.) and benefits. The patient states his understanding and wishes to proceed.  This office visit took 45 minutes, of which >50% involved patient counseling/education.  Review of the [place] CSRS was performed in accordance of the [place] prior to dispensing any controlled drugs.  This note was generated in part with voice recognition software and I apologize for any typographical errors that were not detected and corrected.  Raquel Raylon, PA-C [place] Electronically signed by Lattie Corns, PA at 09/29/2020 5:59 PM EDT  Reviewed  H+P. No changes noted.

## 2020-10-05 NOTE — Transfer of Care (Signed)
Immediate Anesthesia Transfer of Care Note  Patient: Casey Acevedo  Procedure(s) Performed: T12 KYPHOPLASTY  Patient Location: PACU  Anesthesia Type:General  Level of Consciousness: awake, drowsy and patient cooperative  Airway & Oxygen Therapy: Patient Spontanous Breathing and Patient connected to face mask oxygen  Post-op Assessment: Report given to RN and Post -op Vital signs reviewed and stable  Post vital signs: Reviewed and stable  Last Vitals:  Vitals Value Taken Time  BP 132/76 10/05/20 1651  Temp    Pulse 91 10/05/20 1652  Resp 17 10/05/20 1652  SpO2 98 % 10/05/20 1652  Vitals shown include unvalidated device data.  Last Pain:  Vitals:   10/05/20 1423  TempSrc: Oral         Complications: No notable events documented.

## 2020-10-05 NOTE — Anesthesia Postprocedure Evaluation (Signed)
Anesthesia Post Note  Patient: Casey Acevedo  Procedure(s) Performed: T12 KYPHOPLASTY  Patient location during evaluation: PACU Anesthesia Type: General Level of consciousness: awake and alert Pain management: pain level controlled Vital Signs Assessment: post-procedure vital signs reviewed and stable Respiratory status: spontaneous breathing, nonlabored ventilation, respiratory function stable and patient connected to nasal cannula oxygen Cardiovascular status: blood pressure returned to baseline and stable Postop Assessment: no apparent nausea or vomiting Anesthetic complications: no   No notable events documented.   Last Vitals:  Vitals:   10/05/20 1728 10/05/20 1736  BP: (!) 164/94 (!) 146/77  Pulse: 86   Resp: 14   Temp: 36.7 C   SpO2: 98%     Last Pain:  Vitals:   10/05/20 1728  TempSrc: Temporal  PainSc: 2                  Arita Miss

## 2020-10-05 NOTE — Discharge Instructions (Addendum)
Take it easy today and tomorrow then try to start walking is much as you can. Pain medicine as directed Resume all your regular medications Band-Aids come off on Thursday then okay to shower Call office if you are having problems.   AMBULATORY SURGERY  DISCHARGE INSTRUCTIONS   The drugs that you were given will stay in your system until tomorrow so for the next 24 hours you should not:  Drive an automobile Make any legal decisions Drink any alcoholic beverage   You may resume regular meals tomorrow.  Today it is better to start with liquids and gradually work up to solid foods.  You may eat anything you prefer, but it is better to start with liquids, then soup and crackers, and gradually work up to solid foods.   Please notify your doctor immediately if you have any unusual bleeding, trouble breathing, redness and pain at the surgery site, drainage, fever, or pain not relieved by medication.    Additional Instructions:     Please contact your physician with any problems or Same Day Surgery at [phone], Monday through Friday 6 am to 4 pm, or [place] at [facility] number at [phone].

## 2020-10-05 NOTE — Op Note (Signed)
10/05/2020  4:56 PM  PATIENT:  Casey Acevedo   MRN: [id number]   PRE-OPERATIVE DIAGNOSIS:  closed wedge compression fracture of T12   POST-OPERATIVE DIAGNOSIS:  closed wedge compression fracture of T12   PROCEDURE:  Procedure(s): KYPHOPLASTY T12  SURGEON: Laurene Footman, MD   ASSISTANTS: None   ANESTHESIA:   local and MAC   EBL:  No intake/output data recorded.   BLOOD ADMINISTERED:none   DRAINS: none    LOCAL MEDICATIONS USED:  MARCAINE    and XYLOCAINE    SPECIMEN: T12 vertebral body biopsy   DISPOSITION OF SPECIMEN: Pathology   COUNTS:  YES   TOURNIQUET:  * No tourniquets in log *   IMPLANTS: Bone cement   DICTATION: .Dragon Dictation  patient was brought to the operating room and after adequate anesthesia was obtained the patient was placed prone.  C arm was brought in in good visualization of the affected level obtained on both AP and lateral projections.  After patient identification and timeout procedures were completed, local anesthetic was infiltrated with 10 cc 1% Xylocaine infiltrated subcutaneously.  This is done the area on the each side of the planned approach.  The back was then prepped and draped in the usual sterile manner and repeat timeout procedure carried out.  A spinal needle was brought down to the pedicle on the each side of T12 and a 50-50 mix of 1% Xylocaine half percent Sensorcaine with epinephrine total of 20 cc injected on each side.  After allowing this to set a small incision was made and the trocar was advanced into the vertebral body in an extrapedicular fashion.  Biopsy was obtained drilling was carried out balloon inserted with inflation to 2.0 cc on the right and 1.5 cc on the left.  When the cement was appropriate consistency for cc were injected on the right and 3 cc on the left into the vertebral body without extravasation, good fill superior to inferior endplates and from right to left sides along the inferior endplate.  After the cement  had set the trochar was removed and permanent C-arm views obtained.  The wound was closed with Dermabond followed by [place]: Discharged to home after recovery room   PATIENT DISPOSITION:  PACU - hemodynamically stable.

## 2020-10-06 ENCOUNTER — Encounter: Payer: Self-pay | Admitting: Orthopedic Surgery

## 2020-10-07 LAB — SURGICAL PATHOLOGY

## 2020-12-02 ENCOUNTER — Encounter: Payer: Self-pay | Admitting: Nurse Practitioner

## 2020-12-02 ENCOUNTER — Inpatient Hospital Stay (HOSPITAL_BASED_OUTPATIENT_CLINIC_OR_DEPARTMENT_OTHER): Payer: Medicare Other | Admitting: Nurse Practitioner

## 2020-12-02 ENCOUNTER — Inpatient Hospital Stay: Payer: Medicare Other | Attending: Oncology

## 2020-12-02 ENCOUNTER — Other Ambulatory Visit: Payer: Self-pay | Admitting: *Deleted

## 2020-12-02 VITALS — BP 112/60 | HR 89 | Temp 97.4°F | Resp 16 | Ht 69.0 in | Wt 155.2 lb

## 2020-12-02 DIAGNOSIS — Z87891 Personal history of nicotine dependence: Secondary | ICD-10-CM | POA: Insufficient documentation

## 2020-12-02 DIAGNOSIS — Z9221 Personal history of antineoplastic chemotherapy: Secondary | ICD-10-CM | POA: Insufficient documentation

## 2020-12-02 DIAGNOSIS — Z923 Personal history of irradiation: Secondary | ICD-10-CM | POA: Insufficient documentation

## 2020-12-02 DIAGNOSIS — Z8249 Family history of ischemic heart disease and other diseases of the circulatory system: Secondary | ICD-10-CM | POA: Diagnosis not present

## 2020-12-02 DIAGNOSIS — Z8501 Personal history of malignant neoplasm of esophagus: Secondary | ICD-10-CM

## 2020-12-02 DIAGNOSIS — Z08 Encounter for follow-up examination after completed treatment for malignant neoplasm: Secondary | ICD-10-CM

## 2020-12-02 DIAGNOSIS — I700 Atherosclerosis of aorta: Secondary | ICD-10-CM | POA: Insufficient documentation

## 2020-12-02 LAB — CBC WITH DIFFERENTIAL/PLATELET
Abs Immature Granulocytes: 0.01 10*3/uL (ref 0.00–0.07)
Basophils Absolute: 0.1 10*3/uL (ref 0.0–0.1)
Basophils Relative: 2 %
Eosinophils Absolute: 0.3 10*3/uL (ref 0.0–0.5)
Eosinophils Relative: 5 %
HCT: 42.9 % (ref 39.0–52.0)
Hemoglobin: 14.1 g/dL (ref 13.0–17.0)
Immature Granulocytes: 0 %
Lymphocytes Relative: 31 %
Lymphs Abs: 1.6 10*3/uL (ref 0.7–4.0)
MCH: 30.3 pg (ref 26.0–34.0)
MCHC: 32.9 g/dL (ref 30.0–36.0)
MCV: 92.3 fL (ref 80.0–100.0)
Monocytes Absolute: 0.5 10*3/uL (ref 0.1–1.0)
Monocytes Relative: 10 %
Neutro Abs: 2.6 10*3/uL (ref 1.7–7.7)
Neutrophils Relative %: 52 %
Platelets: 180 10*3/uL (ref 150–400)
RBC: 4.65 MIL/uL (ref 4.22–5.81)
RDW: 14.9 % (ref 11.5–15.5)
WBC: 5.2 10*3/uL (ref 4.0–10.5)
nRBC: 0 % (ref 0.0–0.2)

## 2020-12-02 LAB — COMPREHENSIVE METABOLIC PANEL
ALT: 17 U/L (ref 0–44)
AST: 14 U/L — ABNORMAL LOW (ref 15–41)
Albumin: 3.9 g/dL (ref 3.5–5.0)
Alkaline Phosphatase: 81 U/L (ref 38–126)
Anion gap: 7 (ref 5–15)
BUN: 16 mg/dL (ref 8–23)
CO2: 26 mmol/L (ref 22–32)
Calcium: 8.7 mg/dL — ABNORMAL LOW (ref 8.9–10.3)
Chloride: 106 mmol/L (ref 98–111)
Creatinine, Ser: 1.11 mg/dL (ref 0.61–1.24)
GFR, Estimated: >60 mL/min (ref >60)
Glucose, Bld: 115 mg/dL — ABNORMAL HIGH (ref 70–99)
Potassium: 4 mmol/L (ref 3.5–5.1)
Sodium: 139 mmol/L (ref 135–145)
Total Bilirubin: 0.6 mg/dL (ref 0.3–1.2)
Total Protein: 6.6 g/dL (ref 6.5–8.1)

## 2020-12-02 NOTE — Progress Notes (Signed)
Pt doing great since getting his back fixed. 2 fractures and he had kyphoplasty. He has no concerns

## 2020-12-02 NOTE — Progress Notes (Signed)
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 [id number]  Chief complaint/ Reason for visit-routine follow-up of esophageal cancer  Heme/Onc history: 1. Patient is a 80 year old gentleman with no significant past medical history and takes no medications he has a remote history of smokingduring his college days and quit smoking thereafter. No history of any alcohol intake. He was recently admitted to the hospital on 12/06/2016 with symptoms of progressive dysphagia which he states has been going on since the starting of September. His dysphagia got to the point that he began to have pain even on swallowing icecream. He underwent EGD at that time which showed but high 2 cm long stricture 2 cm above the GE junction. Biopsies at that time were negative for malignancy. Given that he was unable to eat he was started on TPN at that time and plan was to get a repeat EGD with possible biopsy   2. Patient underwent repeat EGD on 12/26/2016 where he was again noted to have a lower esophageal stricture which reduced the lumen to less than 3-4 mm. There was no evidence of Barrett's esophagus. 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 Shortly after the stricture was dilated the  stricture did close up significantly. Postprocedure patient complained of abdominal pain and there was a concern for perforation and hence a repeat CT abdomen was obtained which did not show any evidence of perforation   3. CT abdomen pelvis as well as CT chest with contrast on 10/01/2018showed: 7 mm sub pleural nodule in the lingula. No evidence of axillary mediastinal or hilar adenopathy.1.3 cm soft tissue nodule adjacent to the GE junctionwhich is favored to represent an enlarged lymph node potentially active or metastatic in etiology. Narrowing involving thickening of the distal esophagus compatible with known distal esophageal stricture   4. He lives with his wife and is independent of his ADLs and IADLs. Besides dysphagia patient reports no loss of appetite or unintentional weight loss over the last few months. He denies any pain   5. Patient seen by Dr. Servando Snare from cardiothoracic surgery from [place]. He has been deemed to be a potential surgical candidate in the future. Plan for now is to proceed with concurrent chemoradiation followed by EUS upon completion of treatment given difficulty with the   second EGD   6. Chemo/RT started on 01/19/17 and completed on 03/01/17   7. Patient underwent transhiatal total esophagectomy with cervical esophagogastrostomy, pyloromyotomy in April 2019. Pathology showed no residual carcinoma at primary site. 1/5 LN positive for malignancy. 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  Patient currently remains in remission    Interval history-patient reports doing well and denies any complaints.  In the interim he has undergone kyphoplasty for compression fracture.  Pathology was negative for malignancy.  No difficulty swallowing.  Weight is stable.  Feels well.  No new aches or pains.  He has upcoming appointment with urology for incidental finding of enlarged prostate on CT scan.  ECOG PS- 0 Pain scale- 0 Opioid associated constipation- no  Review of  systems- Review of Systems  Constitutional:  Negative for chills, fever, malaise/fatigue and weight loss.  HENT:  Negative for congestion, ear discharge and nosebleeds.   Eyes:  Negative for blurred vision.  Respiratory:  Negative for cough, hemoptysis, sputum production, shortness of breath and wheezing.   Cardiovascular:  Negative for chest pain, palpitations, orthopnea and claudication.  Gastrointestinal:  Negative for abdominal pain, blood in stool, constipation, diarrhea, heartburn, melena, nausea and vomiting.  Genitourinary:  Negative for dysuria, flank pain, frequency, hematuria and urgency.  Musculoskeletal:  Negative for back pain, joint pain and myalgias.  Skin:  Negative for rash.  Neurological:  Negative for dizziness, tingling, focal weakness, seizures, weakness and headaches.  Endo/Heme/Allergies:  Does not bruise/bleed easily.  Psychiatric/Behavioral:  Negative for depression and suicidal ideas. The patient does not have insomnia.      Allergies  Allergen Reactions   Tramadol     Other reaction(s): Hallucination Pt states that it made crazy.     Past Medical History:  Diagnosis Date   Dysphagia    Esophageal cancer ([place]) 2018   Chemo + Rad tx's with surgical resection.      Past Surgical History:  Procedure Laterality Date   CHOLECYSTECTOMY     COMPLETE ESOPHAGECTOMY N/A 06/18/2017   Procedure: cervical ESOPHAGECTOMY COMPLETE;  Surgeon: Grace Isaac, MD;  Location: [facility];  Service: Thoracic;  Laterality: N/A;  NEED NIMS ET TUBE   ESOPHAGOGASTRODUODENOSCOPY (EGD) WITH PROPOFOL N/A 12/07/2016   Procedure: ESOPHAGOGASTRODUODENOSCOPY (EGD) WITH PROPOFOL;  Surgeon: Jonathon Bellows, MD;  Location: [facility] ENDOSCOPY;  Service: Gastroenterology;  Laterality: N/A;   ESOPHAGOGASTRODUODENOSCOPY (EGD) WITH PROPOFOL N/A 12/26/2016   Procedure: ESOPHAGOGASTRODUODENOSCOPY (EGD) WITH PROPOFOL WITH DILATION;  Surgeon: Jonathon Bellows, MD;  Location: [facility] ENDOSCOPY;  Service:  Gastroenterology;  Laterality: N/A;   EYE SURGERY     GASTROJEJUNOSTOMY N/A 01/16/2017   Procedure: LAPROSCOPIC ASSIST FEEDING JEJUNOSTOMY TUBE;  Surgeon: Johnathan Hausen, MD;  Location: [facility] ORS;  Service: General;  Laterality: N/A;   IR FLUORO GUIDE PORT INSERTION RIGHT  01/10/2017   IR REMOVAL TUN ACCESS W/ PORT W/O FL MOD SED  03/22/2018   IR REPLACE G-TUBE SIMPLE WO FLUORO  03/15/2017   IR [place] DUODEN/JEJUNO TUBE PERCUT W/FLUORO  03/05/2017   KYPHOPLASTY N/A 08/31/2020   Procedure: L1 KYPHOPLASTY;  Surgeon: Hessie Knows, MD;  Location: [facility] ORS;  Service: Orthopedics;  Laterality: N/A;   KYPHOPLASTY N/A 10/05/2020   Procedure: T12 KYPHOPLASTY;  Surgeon: Hessie Knows, MD;  Location: [facility] ORS;  Service: Orthopedics;  Laterality: N/A;   PICC LINE INSERTION Right    PYLOROMYOTOMY N/A 06/18/2017   Procedure: PYLOROMYOTOMY;  Surgeon: Grace Isaac, MD;  Location: [place];  Service: Thoracic;  Laterality: N/A;   TRANSURETHRAL RESECTION OF BLADDER TUMOR WITH MITOMYCIN-C N/A 01/13/2020   Procedure: TRANSURETHRAL RESECTION OF BLADDER TUMOR WITH gemcitabine;  Surgeon: Abbie Sons, MD;  Location: [facility] ORS;  Service: Urology;  Laterality: N/A;   VIDEO BRONCHOSCOPY N/A 06/18/2017   Procedure: VIDEO BRONCHOSCOPY;  Surgeon: Grace Isaac, MD;  Location: [facility] OR;  Service: Thoracic;  Laterality: N/A;    Social History   Socioeconomic History   Marital status: Married    Spouse name: Not on file   Number of children: Not on file   Years of education: Not on file  Highest education level: Not on file  Occupational History   Not on file  Tobacco Use   Smoking status: Never   Smokeless tobacco: Never  Vaping Use   Vaping Use: Never used  Substance and Sexual Activity   Alcohol use: No   Drug use: No   Sexual activity: Not Currently  Other Topics Concern   Not on file  Social History Narrative   Independent at baseline. Ambulatory   Social Determinants of Health   Financial Resource  Strain: Not on file  Food Insecurity: Not on file  Transportation Needs: Not on file  Physical Activity: Not on file  Stress: Not on file  Social Connections: Not on file  Intimate Partner Violence: Not on file    Family History  Problem Relation Age of Onset   Heart disease Father    Diabetes Mother    Hypertension Mother    Heart attack Brother 66   Prostate cancer Neg Hx    Bladder Cancer Neg Hx    Kidney cancer Neg Hx      Current Outpatient Medications:    Multiple Vitamins-Minerals (MULTIVITAMIN WITH MINERALS) tablet, Take 1 tablet by mouth daily., Disp: , Rfl:    ondansetron (ZOFRAN ODT) 4 MG disintegrating tablet, Take 1 tablet (4 mg total) by mouth every 8 (eight) hours as needed for nausea or vomiting., Disp: 20 tablet, Rfl: 0 No current facility-administered medications for this visit.  Facility-Administered Medications Ordered in Other Visits:    0.9 %  sodium chloride infusion, , Intravenous, Once, Sindy Guadeloupe, MD   ondansetron (ZOFRAN) 8 mg, dexamethasone (DECADRON) 10 mg in sodium chloride 0.9 % 50 mL IVPB, , Intravenous, Once, Sindy Guadeloupe, MD  Physical exam:  Vitals:   12/02/20 0927  BP: 112/60  Pulse: 89  Resp: 16  Temp: (!) 97.4 F (36.3 C)  TempSrc: Tympanic  Weight: 155 lb 3.2 oz (70.4 kg)  Height: _0  (1.753 m)   Physical Exam Constitutional:      General: He is not in acute distress. Cardiovascular:     Rate and Rhythm: Normal rate and regular rhythm.     Heart sounds: Normal heart sounds.  Pulmonary:     Effort: Pulmonary effort is normal.     Breath sounds: Normal breath sounds.  Abdominal:     General: Bowel sounds are normal.     Palpations: Abdomen is soft.  Skin:    General: Skin is warm and dry.  Neurological:     Mental Status: He is alert and oriented to person, place, and time.     CMP Latest Ref Rng & Units 12/02/2020  Glucose 70 - 99 mg/dL 115(H)  BUN 8 - 23 mg/dL 16  Creatinine 0.61 - 1.24 mg/dL 1.11  Sodium 135  - 145 mmol/L 139  Potassium 3.5 - 5.1 mmol/L 4.0  Chloride 98 - 111 mmol/L 106  CO2 22 - 32 mmol/L 26  Calcium 8.9 - 10.3 mg/dL 8.7(L)  Total Protein 6.5 - 8.1 g/dL 6.6  Total Bilirubin 0.3 - 1.2 mg/dL 0.6  Alkaline Phos 38 - 126 U/L 81  AST 15 - 41 U/L 14(L)  ALT 0 - 44 U/L 17   CBC Latest Ref Rng & Units 12/02/2020  WBC 4.0 - 10.5 K/uL 5.2  Hemoglobin 13.0 - 17.0 g/dL 14.1  Hematocrit 39.0 - 52.0 % 42.9  Platelets 150 - 400 K/uL 180     Assessment and plan- Patient is a 80 y.o. male with  history of nonmetastatic esophageal cancer cTx cN0M0 SCC s/p neoadjuvant chemo-radiation and esophagectomy, ypT0 ypN1, who returns to clinic for continued surveillance.  Patient received Tri modality treatment for esophageal cancer in 2018.  Imaging from 2021 did not reveal evidence of recurrent malignancy.  Due to back pain he had additional imaging in 2022 which also was negative for malignancy.  Clinically he remains asymptomatic.  He is nearly 4 years from his diagnosis.  I do not recommend any additional imaging or EGD unless he is symptomatic.  We will plan to see him back in 1 year with CBC, CMP, and follow-up with Dr. Janese Banks.  We discussed symptoms that would be concerning for recurrence and he knows to call back if he needs to be seen sooner.  Visit Diagnosis 1. Encounter for follow-up surveillance of esophageal cancer    Beckey Rutter, DNP, AGNP-C [place] at [facility] [phone] (clinic) 12/02/2020 10:18 AM

## 2021-01-19 ENCOUNTER — Encounter: Payer: Self-pay | Admitting: Urology

## 2021-01-19 ENCOUNTER — Ambulatory Visit (INDEPENDENT_AMBULATORY_CARE_PROVIDER_SITE_OTHER): Payer: Medicare Other | Admitting: Urology

## 2021-01-19 ENCOUNTER — Other Ambulatory Visit: Payer: Self-pay

## 2021-01-19 VITALS — BP 137/79 | HR 82 | Ht 69.0 in | Wt 153.0 lb

## 2021-01-19 DIAGNOSIS — C679 Malignant neoplasm of bladder, unspecified: Secondary | ICD-10-CM

## 2021-01-19 DIAGNOSIS — D494 Neoplasm of unspecified behavior of bladder: Secondary | ICD-10-CM

## 2021-01-19 NOTE — H&P (View-Only) (Signed)
   01/19/21  CC:  Chief Complaint  Patient presents with   Cysto    Urologic history: 1.  Ta urothelial carcinoma bladder -TURBT 01/13/2020 for 3 cm papillary tumor -Pathology noninvasive low-grade urothelial carcinoma -Received post resection gemcitabine  HPI: 80 y.o. male presents for surveillance cystoscopy.  He has no complaints and denies gross hematuria  Blood pressure 137/79, pulse 82, height 5\' 9"  (1.753 m), weight 153 lb (69.4 kg). NED. A&Ox3.   No respiratory distress   Abd soft, NT, ND Normal phallus with bilateral descended testicles  Cystoscopy Procedure Note  Patient identification was confirmed, informed consent was obtained, and patient was prepped using Betadine solution.  Lidocaine jelly was administered per urethral meatus.     Pre-Procedure: - Inspection reveals a normal caliber urethral meatus.  Procedure: The flexible cystoscope was introduced without difficulty - No urethral strictures/lesions are present. -Moderate lateral lobe enlargement prostate  - Normal bladder neck - Bilateral ureteral orifices identified - Bladder mucosa  reveals ~ 1 cm papillary lesion upper posterior wall;           possible early papillary changes anterior bladder neck - No bladder stones - Moderate trabeculation  Retroflexion shows as above   Post-Procedure: - Patient tolerated the procedure well  Assessment/ Plan: Recurrent bladder tumor Findings were discussed in detail Recommend scheduling TURBT.  Procedure was discussed including potential risks of bleeding, infection and bladder perforation We will plan on postresection gemcitabine    Abbie Sons, MD

## 2021-01-19 NOTE — Progress Notes (Signed)
   01/19/21  CC:  Chief Complaint  Patient presents with   Cysto    Urologic history: 1.  Ta urothelial carcinoma bladder -TURBT 01/13/2020 for 3 cm papillary tumor -Pathology noninvasive low-grade urothelial carcinoma -Received post resection gemcitabine  HPI: 80 y.o. male presents for surveillance cystoscopy.  He has no complaints and denies gross hematuria  Blood pressure 137/79, pulse 82, height 5\' 9"  (1.753 m), weight 153 lb (69.4 kg). NED. A&Ox3.   No respiratory distress   Abd soft, NT, ND Normal phallus with bilateral descended testicles  Cystoscopy Procedure Note  Patient identification was confirmed, informed consent was obtained, and patient was prepped using Betadine solution.  Lidocaine jelly was administered per urethral meatus.     Pre-Procedure: - Inspection reveals a normal caliber urethral meatus.  Procedure: The flexible cystoscope was introduced without difficulty - No urethral strictures/lesions are present. -Moderate lateral lobe enlargement prostate  - Normal bladder neck - Bilateral ureteral orifices identified - Bladder mucosa  reveals ~ 1 cm papillary lesion upper posterior wall;           possible early papillary changes anterior bladder neck - No bladder stones - Moderate trabeculation  Retroflexion shows as above   Post-Procedure: - Patient tolerated the procedure well  Assessment/ Plan: Recurrent bladder tumor Findings were discussed in detail Recommend scheduling TURBT.  Procedure was discussed including potential risks of bleeding, infection and bladder perforation We will plan on postresection gemcitabine    Abbie Sons, MD

## 2021-01-20 ENCOUNTER — Other Ambulatory Visit: Payer: Self-pay | Admitting: Urology

## 2021-01-20 DIAGNOSIS — D494 Neoplasm of unspecified behavior of bladder: Secondary | ICD-10-CM

## 2021-01-20 NOTE — Progress Notes (Signed)
Surgical Physician Order Form  * Scheduling expectation : Wants to wait until after Thanksgiving  *Length of Case: 45 minutes  *Clearance needed: no  *Anticoagulation Instructions: N/A  *Aspirin Instructions: N/A  *Post-op visit Date/Instructions:  1-2 week with pathology review  *Diagnosis: Bladder Tumor  *Procedure: TURBT <2cm (20802)  -Admit type: OUTpatient  -Anesthesia: Choice  -VTE Prophylaxis Standing Order SCD's       Other:   -Standing Lab Orders Per Anesthesia    Lab other: UA&Urine Culture  -Standing Test orders EKG/Chest x-ray per Anesthesia       Test other:   - Medications:     Ancef 2gm IV   Other Instructions:

## 2021-01-21 LAB — URINALYSIS, COMPLETE
Bilirubin, UA: NEGATIVE
Glucose, UA: NEGATIVE
Leukocytes,UA: NEGATIVE
Nitrite, UA: NEGATIVE
Protein,UA: NEGATIVE
Specific Gravity, UA: 1.025 (ref 1.005–1.030)
Urobilinogen, Ur: 0.2 mg/dL (ref 0.2–1.0)
pH, UA: 5.5 (ref 5.0–7.5)

## 2021-01-21 LAB — MICROSCOPIC EXAMINATION: Bacteria, UA: NONE SEEN

## 2021-01-24 NOTE — Progress Notes (Signed)
[facility] Posting Form   Surgery Date/Time: Date: 02/15/2021  Surgeon: Dr. John Giovanni, MD  Surgery Location: Day Surgery  Inpt ( No  )   Outpt (Yes)   Obs ( No  )   Diagnosis: D49.4 Bladder Tumor  -CPT: 41287  Surgery: TURBT  Stop Anticoagulations: No  Cardiac/Medical/Pulmonary Clearance needed: No   *Orders entered into EPIC  Date: 01/24/21   *Case booked in [place]  Date: 01/20/2021  *Notified pt of Surgery: Date: 01/20/2021  PRE-OP UA & CX: Yes, completed on 01/19/2021  *Placed into Prior Authorization Work Fabio Bering Date: 01/24/21   Assistant/laser/rep:No

## 2021-01-26 ENCOUNTER — Other Ambulatory Visit: Payer: Self-pay

## 2021-01-26 DIAGNOSIS — D494 Neoplasm of unspecified behavior of bladder: Secondary | ICD-10-CM

## 2021-02-07 ENCOUNTER — Other Ambulatory Visit: Payer: Self-pay

## 2021-02-07 ENCOUNTER — Other Ambulatory Visit: Payer: Medicare Other

## 2021-02-07 DIAGNOSIS — D494 Neoplasm of unspecified behavior of bladder: Secondary | ICD-10-CM

## 2021-02-07 LAB — URINALYSIS, COMPLETE
Bilirubin, UA: NEGATIVE
Glucose, UA: NEGATIVE
Ketones, UA: NEGATIVE
Leukocytes,UA: NEGATIVE
Nitrite, UA: NEGATIVE
Protein,UA: NEGATIVE
RBC, UA: NEGATIVE
Specific Gravity, UA: 1.025 (ref 1.005–1.030)
Urobilinogen, Ur: 0.2 mg/dL (ref 0.2–1.0)
pH, UA: 5 (ref 5.0–7.5)

## 2021-02-07 LAB — MICROSCOPIC EXAMINATION
Bacteria, UA: NONE SEEN
Epithelial Cells (non renal): NONE SEEN /hpf (ref 0–10)

## 2021-02-08 ENCOUNTER — Other Ambulatory Visit
Admission: RE | Admit: 2021-02-08 | Discharge: 2021-02-08 | Disposition: A | Discharge status: Home / Self Care | Payer: Medicare Other | Source: Ambulatory Visit | Attending: Urology | Admitting: Urology

## 2021-02-08 ENCOUNTER — Other Ambulatory Visit: Payer: Self-pay

## 2021-02-08 NOTE — Patient Instructions (Signed)
Your procedure is scheduled on: 02/15/21 - Tuesday Report to the Registration Desk on the 1st floor of the [facility]. To find out your arrival time, please call [phone] between 1PM - 3PM on: 02/14/21 - Monday  REMEMBER: Instructions that are not followed completely may result in serious medical risk, up to and including death; or upon the discretion of your surgeon and anesthesiologist your surgery may need to be rescheduled.  Do not eat food or drink any fluids after midnight the night before surgery.  No gum chewing, lozengers or hard candies.  TAKE THESE MEDICATIONS THE MORNING OF SURGERY WITH A SIP OF WATER: NONE  One week prior to surgery: Stop Anti-inflammatories (NSAIDS) such as Advil, Aleve, Ibuprofen, Motrin, Naproxen, Naprosyn and Aspirin based products such as Excedrin, Goodys Powder, BC Powder.  Stop ANY OVER THE COUNTER supplements until after surgery.  You may however, continue to take Tylenol if needed for pain up until the day of surgery.  No Alcohol for 24 hours before or after surgery.  No Smoking including e-cigarettes for 24 hours prior to surgery.  No chewable tobacco products for at least 6 hours prior to surgery.  No nicotine patches on the day of surgery.  Do not use any "recreational" drugs for at least a week prior to your surgery.  Please be advised that the combination of cocaine and anesthesia may have negative outcomes, up to and including death. If you test positive for cocaine, your surgery will be cancelled.  On the morning of surgery brush your teeth with toothpaste and water, you may rinse your mouth with mouthwash if you wish. Do not swallow any toothpaste or mouthwash.  Use CHG Soap or wipes as directed on instruction sheet.  Do not wear jewelry, make-up, hairpins, clips or nail polish.  Do not wear lotions, powders, or perfumes.   Do not shave body from the neck down 48 hours prior to surgery just in case you cut yourself which  could leave a site for infection.  Also, freshly shaved skin may become irritated if using the CHG soap.  Contact lenses, hearing aids and dentures may not be worn into surgery.  Do not bring valuables to the hospital. [facility] is not responsible for any missing/lost belongings or valuables.   Notify your doctor if there is any change in your medical condition (cold, fever, infection).  Wear comfortable clothing (specific to your surgery type) to the hospital.  After surgery, you can help prevent lung complications by doing breathing exercises.  Take deep breaths and cough every 1-2 hours. Your doctor may order a device called an Incentive Spirometer to help you take deep breaths. When coughing or sneezing, hold a pillow firmly against your incision with both hands. This is called "splinting." Doing this helps protect your incision. It also decreases belly discomfort.  If you are being admitted to the hospital overnight, leave your suitcase in the car. After surgery it may be brought to your room.  If you are being discharged the day of surgery, you will not be allowed to drive home. You will need a responsible adult (18 years or older) to drive you home and stay with you that night.   If you are taking public transportation, you will need to have a responsible adult (18 years or older) with you. Please confirm with your physician that it is acceptable to use public transportation.   Please call the [facility]. at [phone] if you have any  questions about these instructions.  Surgery Visitation Policy:  Patients undergoing a surgery or procedure may have one family member or support person with them as long as that person is not COVID-19 positive or experiencing its symptoms.  That person may remain in the waiting area during the procedure and may rotate out with other people.  Inpatient Visitation:    Visiting hours are 7 a.m. to 8 p.m. Up to two visitors  ages 16+ are allowed at one time in a patient room. The visitors may rotate out with other people during the day. Visitors must check out when they leave, or other visitors will not be allowed. One designated support person may remain overnight. The visitor must pass COVID-19 screenings, use hand sanitizer when entering and exiting the patient's room and wear a mask at all times, including in the patient's room. Patients must also wear a mask when staff or their visitor are in the room. Masking is required regardless of vaccination status.

## 2021-02-12 LAB — CULTURE, URINE COMPREHENSIVE

## 2021-02-14 MED ORDER — LACTATED RINGERS IV SOLN: INTRAVENOUS | Status: DC

## 2021-02-14 MED ORDER — CEFAZOLIN SODIUM-DEXTROSE 2-4 GM/100ML-% IV SOLN
2.0000 g | INTRAVENOUS | Status: AC
  Administered 2021-02-15: 2 g via INTRAVENOUS

## 2021-02-14 MED ORDER — ORAL CARE MOUTH RINSE: 15.0000 mL | Freq: Once | OROMUCOSAL | Status: AC

## 2021-02-14 MED ORDER — FAMOTIDINE 20 MG PO TABS: 20.0000 mg | ORAL_TABLET | Freq: Once | ORAL | Status: AC

## 2021-02-14 MED ORDER — CHLORHEXIDINE GLUCONATE 0.12 % MT SOLN: 15.0000 mL | Freq: Once | OROMUCOSAL | Status: AC

## 2021-02-15 ENCOUNTER — Ambulatory Visit: Payer: Medicare Other | Admitting: Anesthesiology

## 2021-02-15 ENCOUNTER — Ambulatory Visit
Admission: RE | Admit: 2021-02-15 | Discharge: 2021-02-15 | Disposition: A | Discharge status: Home / Self Care | Payer: Medicare Other | Attending: Urology | Admitting: Urology

## 2021-02-15 ENCOUNTER — Other Ambulatory Visit: Payer: Self-pay

## 2021-02-15 ENCOUNTER — Encounter
Admission: RE | Disposition: A | Discharge status: Home / Self Care | Payer: Self-pay | Source: Home / Self Care | Attending: Urology

## 2021-02-15 ENCOUNTER — Encounter: Payer: Self-pay | Admitting: Urology

## 2021-02-15 DIAGNOSIS — N400 Enlarged prostate without lower urinary tract symptoms: Secondary | ICD-10-CM | POA: Insufficient documentation

## 2021-02-15 DIAGNOSIS — N35912 Unspecified bulbous urethral stricture, male: Secondary | ICD-10-CM

## 2021-02-15 DIAGNOSIS — C675 Malignant neoplasm of bladder neck: Secondary | ICD-10-CM | POA: Insufficient documentation

## 2021-02-15 DIAGNOSIS — C679 Malignant neoplasm of bladder, unspecified: Secondary | ICD-10-CM | POA: Diagnosis present

## 2021-02-15 DIAGNOSIS — D494 Neoplasm of unspecified behavior of bladder: Secondary | ICD-10-CM

## 2021-02-15 HISTORY — PX: TRANSURETHRAL RESECTION OF BLADDER TUMOR: SHX2575

## 2021-02-15 SURGERY — TURBT (TRANSURETHRAL RESECTION OF BLADDER TUMOR)
Anesthesia: General

## 2021-02-15 MED ORDER — FAMOTIDINE 20 MG PO TABS
ORAL_TABLET | ORAL | Status: AC
  Administered 2021-02-15: 20 mg via ORAL
  Filled 2021-02-15: qty 1

## 2021-02-15 MED ORDER — FENTANYL CITRATE (PF) 100 MCG/2ML IJ SOLN
INTRAMUSCULAR | Status: AC
  Filled 2021-02-15: qty 2

## 2021-02-15 MED ORDER — LIDOCAINE HCL URETHRAL/MUCOSAL 2 % EX GEL
CUTANEOUS | Status: DC | PRN
  Administered 2021-02-15: 1

## 2021-02-15 MED ORDER — OXYCODONE HCL 5 MG PO TABS
ORAL_TABLET | ORAL | Status: AC
  Filled 2021-02-15: qty 1

## 2021-02-15 MED ORDER — PROPOFOL 500 MG/50ML IV EMUL
INTRAVENOUS | Status: DC | PRN
  Administered 2021-02-15: 145 ug/kg/min via INTRAVENOUS

## 2021-02-15 MED ORDER — CHLORHEXIDINE GLUCONATE 0.12 % MT SOLN
OROMUCOSAL | Status: AC
  Administered 2021-02-15: 15 mL via OROMUCOSAL
  Filled 2021-02-15: qty 15

## 2021-02-15 MED ORDER — ACETAMINOPHEN 10 MG/ML IV SOLN
INTRAVENOUS | Status: DC | PRN
  Administered 2021-02-15: 1000 mg via INTRAVENOUS

## 2021-02-15 MED ORDER — OXYCODONE HCL 5 MG PO TABS
5.0000 mg | ORAL_TABLET | Freq: Once | ORAL | Status: AC
  Administered 2021-02-15: 5 mg via ORAL

## 2021-02-15 MED ORDER — OXYCODONE-ACETAMINOPHEN 5-325 MG PO TABS: 1.0000 | ORAL_TABLET | Freq: Four times a day (QID) | ORAL | 0 refills | Status: DC | PRN

## 2021-02-15 MED ORDER — GLYCOPYRROLATE 0.2 MG/ML IJ SOLN
INTRAMUSCULAR | Status: DC | PRN
  Administered 2021-02-15: .2 mg via INTRAVENOUS

## 2021-02-15 MED ORDER — STERILE WATER FOR IRRIGATION IR SOLN
Status: DC | PRN
  Administered 2021-02-15: 2000 mL

## 2021-02-15 MED ORDER — ACETAMINOPHEN 10 MG/ML IV SOLN
INTRAVENOUS | Status: AC
  Filled 2021-02-15: qty 100

## 2021-02-15 MED ORDER — LIDOCAINE HCL URETHRAL/MUCOSAL 2 % EX GEL
CUTANEOUS | Status: AC
  Filled 2021-02-15: qty 10

## 2021-02-15 MED ORDER — LABETALOL HCL 5 MG/ML IV SOLN
INTRAVENOUS | Status: AC
  Administered 2021-02-15: 5 mg via INTRAVENOUS
  Filled 2021-02-15: qty 4

## 2021-02-15 MED ORDER — DEXAMETHASONE SODIUM PHOSPHATE 10 MG/ML IJ SOLN
INTRAMUSCULAR | Status: DC | PRN
  Administered 2021-02-15: 10 mg via INTRAVENOUS

## 2021-02-15 MED ORDER — FENTANYL CITRATE (PF) 100 MCG/2ML IJ SOLN: 25.0000 ug | INTRAMUSCULAR | Status: DC | PRN

## 2021-02-15 MED ORDER — CEFAZOLIN SODIUM-DEXTROSE 2-4 GM/100ML-% IV SOLN
INTRAVENOUS | Status: AC
  Filled 2021-02-15: qty 100

## 2021-02-15 MED ORDER — LABETALOL HCL 5 MG/ML IV SOLN
5.0000 mg | INTRAVENOUS | Status: AC | PRN
  Administered 2021-02-15: 5 mg via INTRAVENOUS
  Filled 2021-02-15 (×3): qty 4

## 2021-02-15 MED ORDER — FENTANYL CITRATE (PF) 100 MCG/2ML IJ SOLN
INTRAMUSCULAR | Status: DC | PRN
  Administered 2021-02-15: 25 ug via INTRAVENOUS

## 2021-02-15 MED ORDER — ONDANSETRON HCL 4 MG/2ML IJ SOLN: 4.0000 mg | Freq: Once | INTRAMUSCULAR | Status: DC | PRN

## 2021-02-15 MED ORDER — ONDANSETRON HCL 4 MG/2ML IJ SOLN
INTRAMUSCULAR | Status: DC | PRN
  Administered 2021-02-15: 4 mg via INTRAVENOUS

## 2021-02-15 SURGICAL SUPPLY — 28 items
BAG DRAIN CYSTO-URO LG1000N (MISCELLANEOUS) ×2 IMPLANT
BAG DRN RND TRDRP ANRFLXCHMBR (UROLOGICAL SUPPLIES)
BAG URINE DRAIN 2000ML AR STRL (UROLOGICAL SUPPLIES) ×1 IMPLANT
CATH FOLEY 2WAY 18X30 (CATHETERS) IMPLANT
CATH FOLEY 2WAY SIL 18X30 (CATHETERS)
DRAPE UTILITY 15X26 TOWEL STRL (DRAPES) ×2 IMPLANT
DRSG TELFA 4X3 1S NADH ST (GAUZE/BANDAGES/DRESSINGS) ×2 IMPLANT
ELECT LOOP 22F BIPOLAR SML (ELECTROSURGICAL)
ELECT REM PT RETURN 9FT ADLT (ELECTROSURGICAL) ×2
ELECTRODE LOOP 22F BIPOLAR SML (ELECTROSURGICAL) IMPLANT
ELECTRODE REM PT RTRN 9FT ADLT (ELECTROSURGICAL) IMPLANT
GAUZE 4X4 16PLY ~~LOC~~+RFID DBL (SPONGE) ×4 IMPLANT
GLOVE SURG UNDER POLY LF SZ7.5 (GLOVE) ×2 IMPLANT
GOWN STRL REUS W/ TWL LRG LVL3 (GOWN DISPOSABLE) ×1 IMPLANT
GOWN STRL REUS W/TWL LRG LVL3 (GOWN DISPOSABLE) ×2
GOWN STRL REUS W/TWL XL LVL4 (GOWN DISPOSABLE) ×2 IMPLANT
IV NS IRRIG 3000ML ARTHROMATIC (IV SOLUTION) ×2 IMPLANT
KIT TURNOVER CYSTO (KITS) ×2 IMPLANT
LOOP CUT BIPOLAR 24F LRG (ELECTROSURGICAL) IMPLANT
MANIFOLD NEPTUNE II (INSTRUMENTS) ×2 IMPLANT
PACK CYSTO AR (MISCELLANEOUS) ×2 IMPLANT
SET IRRIG Y TYPE TUR BLADDER L (SET/KITS/TRAYS/PACK) ×2 IMPLANT
SURGILUBE 2OZ TUBE FLIPTOP (MISCELLANEOUS) ×2 IMPLANT
SYR TOOMEY IRRIG 70ML (MISCELLANEOUS) ×2
SYRINGE TOOMEY IRRIG 70ML (MISCELLANEOUS) ×1 IMPLANT
WATER STERILE IRR 1000ML POUR (IV SOLUTION) ×1 IMPLANT
WATER STERILE IRR 3000ML UROMA (IV SOLUTION) ×1 IMPLANT
WATER STERILE IRR 500ML POUR (IV SOLUTION) ×2 IMPLANT

## 2021-02-15 NOTE — Anesthesia Preprocedure Evaluation (Signed)
Anesthesia Evaluation  Patient identified by MRN, date of birth, ID band Patient awake    Reviewed: Allergy & Precautions, H&P , NPO status , Patient's Chart, lab work & pertinent test results, reviewed documented beta blocker date and time   Airway Mallampati: II  TM Distance: >3 FB Neck ROM: full    Dental  (+) Teeth Intact   Pulmonary neg pulmonary ROS,    Pulmonary exam normal        Cardiovascular Exercise Tolerance: Good + CAD  Normal cardiovascular exam Rate:Normal     Neuro/Psych negative neurological ROS  negative psych ROS   GI/Hepatic negative GI ROS, Neg liver ROS,   Endo/Other  negative endocrine ROS  Renal/GU negative Renal ROS  negative genitourinary   Musculoskeletal   Abdominal   Peds  Hematology negative hematology ROS (+)   Anesthesia Other Findings   Reproductive/Obstetrics negative OB ROS                             Anesthesia Physical Anesthesia Plan  ASA: 3  Anesthesia Plan: General LMA   Post-op Pain Management:    Induction:   PONV Risk Score and Plan:   Airway Management Planned:   Additional Equipment:   Intra-op Plan:   Post-operative Plan:   Informed Consent: I have reviewed the patients History and Physical, chart, labs and discussed the procedure including the risks, benefits and alternatives for the proposed anesthesia with the patient or authorized representative who has indicated his/her understanding and acceptance.       Plan Discussed with: CRNA  Anesthesia Plan Comments:         Anesthesia Quick Evaluation

## 2021-02-15 NOTE — Anesthesia Procedure Notes (Signed)
Procedure Name: General with mask airway Date/Time: 02/15/2021 10:37 AM Performed by: Kelton Pillar, CRNA Pre-anesthesia Checklist: Patient identified, Emergency Drugs available, Suction available and Patient being monitored Patient Re-evaluated:Patient Re-evaluated prior to induction Oxygen Delivery Method: Simple face mask Induction Type: IV induction Placement Confirmation: positive ETCO2 and CO2 detector Dental Injury: Teeth and Oropharynx as per pre-operative assessment

## 2021-02-15 NOTE — Interval H&P Note (Signed)
History and Physical Interval Note:  CV:RRR Lungs:clear  02/15/2021 10:05 AM  Casey Acevedo  has presented today for surgery, with the diagnosis of Bladder Tumor.  The various methods of treatment have been discussed with the patient and family. After consideration of risks, benefits and other options for treatment, the patient has consented to  Procedure(s): TRANSURETHRAL RESECTION OF BLADDER TUMOR (TURBT) (N/A) as a surgical intervention.  The patient's history has been reviewed, patient examined, no change in status, stable for surgery.  I have reviewed the patient's chart and labs.  Questions were answered to the patient's satisfaction.     [place]

## 2021-02-15 NOTE — Progress Notes (Signed)
Patient ready for d/c and BP rechecked and was 178/109. Notified Dr. Andree Elk and received order to recheck BP with larger cuff. If not effective, Oxycodone 5 mg and if not effective may give Labetalol 5 mg x2. All orders administered. Notified anesthesia that BP is 175/91. Dr. Bertell Maria ok with pt. going home but advised pt. and spouse that if he becomes lightheaded or dizzy or have blurred vision that he would need to return to the ER and they were agreeable. BP rechecked before leaving and was 157/83. No lightheadedness or blurred vision noted.

## 2021-02-15 NOTE — Op Note (Signed)
Preoperative diagnosis:  Bladder tumor  Postoperative diagnosis:  Same  Procedure: Cystoscopy with bladder biopsy  Surgeon: Abbie Sons, MD  Anesthesia: General  Complications: None  Intraoperative findings:  Cystoscopy-urethra with wide caliber bulbar stricture.  Moderate lateral lobe enlargement with mild bladder neck elevation.  UOs normal-appearing bilaterally.  Moderate bladder trabeculation.  Anterior bladder neck at the 12 o'clock position is a < 1 cm papillary tumor.  No other bladder mucosal lesions were identified  EBL: Minimal  Specimens: Bladder tumor  Indication: Casey Acevedo is a 80 y.o. 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  Recent surveillance cystoscopy with papillary lesion.  Due to potential of airway issues anesthesia inquired if this could possibly be done under sedation.  After reviewing the management options for treatment, he elected to proceed with the above surgical procedure(s). We have discussed the potential benefits and risks of the procedure, side effects of the proposed treatment, the likelihood of the patient achieving the goals of the procedure, and any potential problems that might occur during the procedure or recuperation. Informed consent has been obtained.  Description of procedure:  The patient was taken to the operating room and deep sedation was obtained by anesthesia.  The patient was placed in the dorsal lithotomy position, prepped and draped in the usual sterile fashion, and preoperative antibiotics were administered. A preoperative time-out was performed.  A lidocaine Urojet was instilled per urethra.  A 21 French cystoscope was lubricated, passed per urethra and advanced proximally under direct vision with findings as described above.  A rigid biopsy forceps was placed into the cystoscope sheath and the entire tumor was removed in 1 bite.  Additional biopsies  of the base of the tumor were taken.  Hemostasis was obtained with a Bugbee electrode.  At completion of the procedure hemostasis was adequate.  A Foley catheter was not placed.  He was transported to the PACU in stable condition  Plan: He will be contacted with the pathology results and further follow-up at that time   Abbie Sons, M.D.

## 2021-02-15 NOTE — Transfer of Care (Signed)
Immediate Anesthesia Transfer of Care Note  Patient: Casey Acevedo  Procedure(s) Performed: TRANSURETHRAL RESECTION OF BLADDER TUMOR (TURBT)  Patient Location: PACU  Anesthesia Type:General  Level of Consciousness: awake, drowsy and patient cooperative  Airway & Oxygen Therapy: Patient Spontanous Breathing and Patient connected to face mask oxygen  Post-op Assessment: Report given to RN and Post -op Vital signs reviewed and stable  Post vital signs: Reviewed and stable  Last Vitals:  Vitals Value Taken Time  BP 98/65 02/15/21 1107  Temp    Pulse 77 02/15/21 1109  Resp 12 02/15/21 1109  SpO2 100 % 02/15/21 1109  Vitals shown include unvalidated device data.  Last Pain:  Vitals:   02/15/21 0859  TempSrc: Temporal  PainSc: 0-No pain         Complications: No notable events documented.

## 2021-02-15 NOTE — Discharge Instructions (Addendum)
Bladder Biopsy    General instructions:     Your recent bladder surgery requires very little post hospital care but some definite precautions.  Despite the fact that no skin incisions were used, the area around the bladder incisions are raw and covered with scabs to promote healing and prevent bleeding. Certain precautions are needed to insure that the scabs are not disturbed over the next 2-4 weeks while the healing proceeds.  Because the raw surface inside your bladder and the irritating effects of urine you may expect frequency of urination and/or urgency (a stronger desire to urinate) and perhaps even getting up at night more often. This will usually resolve or improve slowly over the healing period. You may see some blood in your urine over the first 6 weeks. Do not be alarmed, even if the urine was clear for a while. Get off your feet and drink lots of fluids until clearing occurs. If you start to pass clots or don't improve call us.  Call our office at [phone] for fever greater than 101 degrees, inability to urinate or excessive blood in the urine (appearance of tomato juice or multiple clots)  Diet:  You may return to your normal diet immediately. Because of the raw surface of your bladder, alcohol, spicy foods, foods high in acid and drinks with caffeine may cause irritation or frequency and should be used in moderation. To keep your urine flowing freely and avoid constipation, drink plenty of fluids during the day (8-10 glasses). Tip: Avoid cranberry juice because it is very acidic.  Activity:  Your physical activity doesn't need to be restricted. However, if you are very active, you may see some blood in the urine. We suggest that you reduce your activity under the circumstances until the bleeding has stopped.  Bowels:  It is important to keep your bowels regular during the postoperative period. Straining with bowel movements can cause bleeding. A bowel movement every other  day is reasonable. Use a mild laxative if needed, such as milk of magnesia 2-3 tablespoons, or 2 Dulcolax tablets. Call if you continue to have problems. If you had been taking narcotics for pain, before, during or after your surgery, you may be constipated. Take a laxative if necessary.    Medication:  You should resume your pre-surgery medications unless told not to.  Urinary frequency, urgency and burning are normal postoperative symptoms.  Azo (for burning) can be taken to help ease the symptoms.  It is available over-the-counter  Follow-up:  You will be contacted with your pathology results and additional follow-up will be scheduled at that time   [place], [place], [place] [address] [phone]    [place]   The drugs that you were given will stay in your system until tomorrow so for the next 24 hours you should not:  Drive an automobile Make any legal decisions Drink any alcoholic beverage   You may resume regular meals tomorrow.  Today it is better to start with liquids and gradually work up to solid foods.  You may eat anything you prefer, but it is better to start with liquids, then soup and crackers, and gradually work up to solid foods.   Please notify your doctor immediately if you have any unusual bleeding, trouble breathing, redness and pain at the surgery site, drainage, fever, or pain not relieved by medication.    Your post-operative visit with Dr.  is: Date:                        Time:    Please call to schedule your post-operative visit.  Additional Instructions: 

## 2021-02-16 ENCOUNTER — Encounter: Payer: Self-pay | Admitting: Urology

## 2021-02-16 LAB — SURGICAL PATHOLOGY

## 2021-02-16 NOTE — Anesthesia Postprocedure Evaluation (Signed)
Anesthesia Post Note  Patient: Casey Acevedo  Procedure(s) Performed: TRANSURETHRAL RESECTION OF BLADDER TUMOR (TURBT)  Patient location during evaluation: PACU Anesthesia Type: General Level of consciousness: awake and alert Pain management: pain level controlled Vital Signs Assessment: post-procedure vital signs reviewed and stable Respiratory status: spontaneous breathing, nonlabored ventilation, respiratory function stable and patient connected to nasal cannula oxygen Cardiovascular status: blood pressure returned to baseline and stable Postop Assessment: no apparent nausea or vomiting Anesthetic complications: no   No notable events documented.   Last Vitals:  Vitals:   02/15/21 1602 02/15/21 1612  BP: (!) 175/91 (!) 157/83  Pulse:  88  Resp:  16  Temp:    SpO2:  98%    Last Pain:  Vitals:   02/15/21 1612  TempSrc:   PainSc: 0-No pain                 Molli Barrows

## 2021-02-17 ENCOUNTER — Telehealth: Payer: Self-pay

## 2021-02-17 ENCOUNTER — Ambulatory Visit (INDEPENDENT_AMBULATORY_CARE_PROVIDER_SITE_OTHER): Payer: Medicare Other | Admitting: Physician Assistant

## 2021-02-17 ENCOUNTER — Other Ambulatory Visit: Payer: Self-pay

## 2021-02-17 VITALS — BP 158/86 | HR 99 | Temp 97.9°F

## 2021-02-17 DIAGNOSIS — C679 Malignant neoplasm of bladder, unspecified: Secondary | ICD-10-CM

## 2021-02-17 DIAGNOSIS — D494 Neoplasm of unspecified behavior of bladder: Secondary | ICD-10-CM

## 2021-02-17 DIAGNOSIS — K567 Ileus, unspecified: Secondary | ICD-10-CM

## 2021-02-17 DIAGNOSIS — N9989 Other postprocedural complications and disorders of genitourinary system: Secondary | ICD-10-CM | POA: Diagnosis not present

## 2021-02-17 DIAGNOSIS — K9189 Other postprocedural complications and disorders of digestive system: Secondary | ICD-10-CM | POA: Diagnosis not present

## 2021-02-17 DIAGNOSIS — R338 Other retention of urine: Secondary | ICD-10-CM | POA: Diagnosis not present

## 2021-02-17 LAB — URINALYSIS, COMPLETE
Bilirubin, UA: NEGATIVE
Ketones, UA: NEGATIVE
Nitrite, UA: POSITIVE — AB
Specific Gravity, UA: <1.005 — ABNORMAL LOW (ref 1.005–1.030)
Urobilinogen, Ur: 1 mg/dL (ref 0.2–1.0)
pH, UA: 5.5 (ref 5.0–7.5)

## 2021-02-17 LAB — MICROSCOPIC EXAMINATION: RBC, Urine: >30 /hpf — ABNORMAL HIGH (ref 0–2)

## 2021-02-17 LAB — BLADDER SCAN AMB NON-IMAGING

## 2021-02-17 NOTE — Progress Notes (Signed)
02/17/2021 10:17 AM   Casey Acevedo April 23, 1940 [id number]  CC: Chief Complaint  Patient presents with   Post-op Problem   HPI: Casey Acevedo is a 80 y.o. male with PMH low-grade urothelial carcinoma with recurrence who underwent TURBT with Dr. Bernardo Heater 2 days ago who presents today for evaluation of postoperative urinary leakage, nausea, and vomiting.   Today he reports he has not had a bowel movement since surgery, though he began passing flatus this morning.  He started having significant urinary incontinence yesterday and took Azo for this.  He had some nausea last night that worsened to nausea and vomiting this morning.  Surgical pathology has resulted with low-grade, noninvasive papillary urothelial carcinoma.  In-office UA today positive for orange color, trace glucose, 2+ blood, 1+ protein, nitrites, and trace leukocyte esterace; urine microscopy with 11-30 WBCs/HPF, >30 RBCs/HPF, and few bacteria. PVR 657m.  PMH: Past Medical History:  Diagnosis Date   [place] of [place]   Dysphagia    [place]   Chemo + Rad tx's with surgical resection.    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    Surgical History: Past Surgical History:  Procedure Laterality Date   CHOLECYSTECTOMY     COMPLETE ESOPHAGECTOMY N/A 06/18/2017   Procedure: cervical ESOPHAGECTOMY COMPLETE;  Surgeon: GGrace Isaac MD;  [facility]  Service: Thoracic;  Laterality: N/A;  NEED NIMS ET TUBE   ESOPHAGOGASTRODUODENOSCOPY (EGD) WITH PROPOFOL N/A 12/07/2016   Procedure: ESOPHAGOGASTRODUODENOSCOPY (EGD) WITH PROPOFOL;  Surgeon: AJonathon Bellows MD;  [facility];  Service: Gastroenterology;  Laterality: N/A;   ESOPHAGOGASTRODUODENOSCOPY (EGD) WITH PROPOFOL N/A 12/26/2016   Procedure: ESOPHAGOGASTRODUODENOSCOPY (EGD) WITH PROPOFOL WITH DILATION;  Surgeon: AJonathon Bellows MD;  [facility];  Service: Gastroenterology;  Laterality: N/A;   EYE SURGERY     GASTROJEJUNOSTOMY N/A 01/16/2017   Procedure: LAPROSCOPIC ASSIST FEEDING JEJUNOSTOMY TUBE;  Surgeon: MJohnathan Hausen MD;  Location[facility];  Service: General;  Laterality: N/A;   IR FLUORO GUIDE PORT INSERTION RIGHT  01/10/2017   IR REMOVAL TUN ACCESS W/ PORT W/O FL MOD SED  03/22/2018   IR REPLACE G-TUBE SIMPLE WO FLUORO  03/15/2017   IR RValdersDUODEN/JEJUNO TUBE PERCUT W/FLUORO  03/05/2017   KYPHOPLASTY N/A 08/31/2020   Procedure: L1 KYPHOPLASTY;  Surgeon: MHessie Knows MD;  Location: [facility];  Service: Orthopedics;  Laterality: N/A;   KYPHOPLASTY N/A 10/05/2020   Procedure: T12 KYPHOPLASTY;  Surgeon: MHessie Knows MD;  Location: [facility] ORS;  Service: Orthopedics;  Laterality: N/A;   PICC LINE INSERTION Right    PYLOROMYOTOMY N/A 06/18/2017   Procedure: PYLOROMYOTOMY;  Surgeon: GGrace Isaac MD;  [facility] [place]  Service: Thoracic;  Laterality: N/A;   TRANSURETHRAL RESECTION OF BLADDER TUMOR N/A 02/15/2021   Procedure: TRANSURETHRAL RESECTION OF BLADDER TUMOR (TURBT);  Surgeon: SAbbie Sons MD;  Location: [facility];  Service: Urology;  Laterality: N/A;   TRANSURETHRAL RESECTION OF BLADDER TUMOR WITH MITOMYCIN-C N/A 01/13/2020   Procedure: TRANSURETHRAL RESECTION OF BLADDER TUMOR WITH gemcitabine;  Surgeon: SAbbie Sons MD;  Location: [facility];  Service: Urology;  Laterality: N/A;   VIDEO BRONCHOSCOPY N/A 06/18/2017   Procedure: VIDEO BRONCHOSCOPY;  Surgeon: GGrace Isaac MD;  [facility];  Service: Thoracic;  Laterality: N/A;    Home Medications:  Allergies as of 02/17/2021       Reactions   Tramadol Other (See Comments)   Other reaction(s):  Hallucination Pt states that it made crazy.        Medication List        Accurate as of February 17, 2021 10:17 AM. If you have any questions, ask your nurse or doctor.          multivitamin with minerals tablet Take 1 tablet by mouth daily.    oxyCODONE-acetaminophen 5-325 MG tablet Commonly known as: PERCOCET/ROXICET Take 1 tablet by mouth every 6 (six) hours as needed for severe pain.        Allergies:  Allergies  Allergen Reactions   Tramadol Other (See Comments)    Other reaction(s): Hallucination Pt states that it made crazy.    Family History: Family History  Problem Relation Age of Onset   Heart disease Father    Diabetes Mother    Hypertension Mother    Heart attack Brother 38   Prostate cancer Neg Hx    Bladder Cancer Neg Hx    Kidney cancer Neg Hx     Social History:   reports that he has never smoked. He has never used smokeless tobacco. He reports that he does not drink alcohol and does not use drugs.  Physical Exam: BP (!) 158/86   Pulse 99   Constitutional:  Alert and oriented, no acute distress, nontoxic appearing HEENT: [place], AT Cardiovascular: No clubbing, cyanosis, or edema Respiratory: Normal respiratory effort, no increased work of breathing Skin: No rashes, bruises or suspicious lesions Neurologic: Grossly intact, no focal deficits, moving all 4 extremities Psychiatric: Normal mood and affect  Laboratory Data: Results for orders placed or performed in visit on 02/17/21  Microscopic Examination   Urine  Result Value Ref Range   WBC, UA 11-30 (A) 0 - 5 /hpf   RBC >30 (H) 0 - 2 /hpf   Epithelial Cells (non renal) 0-10 0 - 10 /hpf   Bacteria, UA Few (A) None seen/Few  Urinalysis, Complete  Result Value Ref Range   Specific Gravity, UA <1.005 (L) 1.005 - 1.030   pH, UA 5.5 5.0 - 7.5   Color, UA Orange Yellow   Appearance Ur Clear Clear   Leukocytes,UA Trace (A) Negative   Protein,UA 1+ (A) Negative/Trace   Glucose, UA Trace (A) Negative   Ketones, UA Negative Negative   RBC, UA 2+ (A) Negative   Bilirubin, UA Negative Negative   Urobilinogen, Ur 1.0 0.2 - 1.0 mg/dL   Nitrite, UA Positive (A) Negative   Microscopic Examination See below:   Bladder Scan (Post Void Residual) in  office  Result Value Ref Range   Scan Result 623m    Simple Catheter Placement  Due to urinary retention patient is present today for a foley cath placement.  Patient was cleaned and prepped in a sterile fashion with betadine and 2% lidocaine jelly was instilled into the urethra. A 16 FR coud foley catheter was inserted, urine return was noted  609m urine was orange in color.  The balloon was filled with 10cc of sterile water.  A leg bag was attached for drainage. Patient was also given a night bag to take home and was given instruction on how to change from one bag to another.  Patient was given instruction on proper catheter care.  Patient tolerated well, no complications were noted   Performed by: SaDebroah LoopPA-C   Assessment & Plan:   1. Postoperative urinary retention Noted on intake today.  He is afebrile and vitals are otherwise stable.  UA is consistent with  his postoperative status and I suspect nitrites are a false positive in the setting of Azo use, though we will send urine for culture today and treat as indicated.  I offer the patient Foley catheter placement today with plans for voiding trial in 1 week.  He is in agreement with this plan.  See procedure note above. - Urinalysis, Complete - Bladder Scan (Post Void Residual) in office - CULTURE, URINE COMPREHENSIVE  2. Urothelial carcinoma of bladder ([place]) We discussed his surgical pathology findings and Dr. Dene Gentry recommendation to undergo repeat cystoscopy in 3 months.  Patient agrees, cystoscopy scheduled.  3. Postoperative ileus ([place]) Likely given reports of no bowel movements or flatus until this morning.  This may be contributing to his urinary retention.  With no flatus this morning, I suspect this is resolving and will not require further intervention.  Counseled patient to continue stool softeners, ambulate, and stay well-hydrated.  Return in about 1 week (around 02/24/2021) for Voiding trial + 3 month  cystoscopy with Dr. Bernardo Heater.  Debroah Loop, PA-C  [facility] [address], [place], [place] [address] [phone]

## 2021-02-17 NOTE — Telephone Encounter (Signed)
Incoming call from pt and wife on triage line, patient states that he is weak and disoriented. He has been able to void with not problems, however he states that there is "more coming out than going in" and he believes he is now dehydrated. He states that these are his usual symptoms with dehydration and he believes he needs IV fluids. Advised pt that we are unable to administer IV fluids in clinic and advised he seek care in ED if that is his desire. Pt declines ED, pt requests in office evaluation to determine if provider thinks fluids are necessary. Appt scheduled.

## 2021-02-17 NOTE — Patient Instructions (Addendum)
I saw you today for postoperative urinary retention. This is a full urinary bladder that can sometimes happen after undergoing anesthesia for surgery. I placed a urinary catheter today and will see you back next week to take it out and make sure you are urinating again on your own.  Please wash the exposed part of the catheter tubing coming out of your penis once daily with warm, soapy water and make sure to empty your drainage bags as demonstrated in clinic today often enough to keep urine from backing up into your bladder.  Make sure you stay well hydrated today and tomorrow. I recommend drinking an electrolyte-containing beverage such a Pedialyte or Gatorade. You should also take a stool softener such as Colace to allow your bowels to return to normal after surgery. You may walk as desired.  Your surgical pathology came back with low-grade, nonmuscle invasive bladder cancer. Dr. Bernardo Heater recommends bringing you back in for another cystoscopy in clinic in 3 months.

## 2021-02-23 ENCOUNTER — Ambulatory Visit (INDEPENDENT_AMBULATORY_CARE_PROVIDER_SITE_OTHER): Payer: Medicare Other | Admitting: Urology

## 2021-02-23 ENCOUNTER — Ambulatory Visit: Payer: Medicare Other | Admitting: Physician Assistant

## 2021-02-23 ENCOUNTER — Other Ambulatory Visit: Payer: Self-pay

## 2021-02-23 ENCOUNTER — Ambulatory Visit: Payer: Medicare Other | Admitting: Urology

## 2021-02-23 DIAGNOSIS — N9989 Other postprocedural complications and disorders of genitourinary system: Secondary | ICD-10-CM | POA: Diagnosis not present

## 2021-02-23 DIAGNOSIS — R338 Other retention of urine: Secondary | ICD-10-CM | POA: Diagnosis not present

## 2021-02-23 LAB — CULTURE, URINE COMPREHENSIVE

## 2021-02-23 LAB — BLADDER SCAN AMB NON-IMAGING: Scan Result: 664

## 2021-02-23 NOTE — Progress Notes (Signed)
Catheter Removal  Patient is present today for a catheter removal.  88ml of water was drained from the balloon. A 14FR foley coude cath was removed from the bladder no complications were noted . Patient tolerated well.  Performed by: Bradly Bienenstock CMA  Follow up/ Additional notes: Push fluids and RTC this afternoon for PVR.

## 2021-02-23 NOTE — Progress Notes (Signed)
02/23/2021 8:38 PM   Casey Acevedo 1940-06-10 [id number]  Referring provider: Idelle Crouch, MD [facility] [place],   [address]  Chief Complaint  Patient presents with   Urinary Retention   Urological history: 1.  Bladder cancer -Low-grade urothelial carcinoma -Bladder mass seen incidentally on surveillance CTs of the chest, abdomen, pelvis in September 2021 for esophageal cancer -TURBT x 2 - most recent 02/2021  2.  Elevated PSA -PSA Trend  15.40 in 09/2020    7.91 in 12/2019    6.61 in 01/2019    4.8 in 08/2016    6.6 in 07/2016    5.6 in 06/2016     3. BPH with LU TS  HPI: Casey Acevedo is a 80 y.o. male who presents today for a voiding trial after going into urinary retention after his TURBT.  Foley catheter was removed this morning.  He presented this afternoon with minimal urinary output during the day and his PVR was 664 mL  Patient denies any modifying or aggravating factors.  Patient denies any gross hematuria, dysuria or suprapubic/flank pain.  Patient denies any fevers, chills, nausea or vomiting.    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 ([place]) 06/05/2017    Surgical History: Past Surgical History:  Procedure Laterality Date   CHOLECYSTECTOMY     COMPLETE ESOPHAGECTOMY N/A 06/18/2017   Procedure: cervical ESOPHAGECTOMY COMPLETE;  Surgeon: Grace Isaac, MD;  Location: [facility] OR;  Service: Thoracic;  Laterality: N/A;  NEED NIMS ET TUBE   ESOPHAGOGASTRODUODENOSCOPY (EGD) WITH PROPOFOL N/A 12/07/2016   Procedure: ESOPHAGOGASTRODUODENOSCOPY (EGD) WITH PROPOFOL;  Surgeon: Jonathon Bellows, MD;  Location[facility] ENDOSCOPY;  Service: Gastroenterology;  Laterality: N/A;   ESOPHAGOGASTRODUODENOSCOPY (EGD)  WITH PROPOFOL N/A 12/26/2016   Procedure: ESOPHAGOGASTRODUODENOSCOPY (EGD) WITH PROPOFOL WITH DILATION;  Surgeon: Jonathon Bellows, MD;  Location: [facility] ENDOSCOPY;  Service: Gastroenterology;  Laterality: N/A;   EYE SURGERY     GASTROJEJUNOSTOMY N/A 01/16/2017   Procedure: LAPROSCOPIC ASSIST FEEDING JEJUNOSTOMY TUBE;  Surgeon: Johnathan Hausen, MD;  [facility] ORS;  Service: General;  Laterality: N/A;   IR FLUORO GUIDE PORT INSERTION RIGHT  01/10/2017   IR REMOVAL TUN ACCESS W/ PORT W/O FL MOD SED  03/22/2018   IR REPLACE G-TUBE SIMPLE WO FLUORO  03/15/2017   IR [place] DUODEN/JEJUNO TUBE PERCUT W/FLUORO  03/05/2017   KYPHOPLASTY N/A 08/31/2020   Procedure: L1 KYPHOPLASTY;  Surgeon: Hessie Knows, MD;  Location: [facility] ORS;  Service: Orthopedics;  Laterality: N/A;   KYPHOPLASTY N/A 10/05/2020   Procedure: T12 KYPHOPLASTY;  Surgeon: Hessie Knows, MD;  Location: [facility] ORS;  Service: Orthopedics;  Laterality: N/A;   PICC LINE INSERTION Right    PYLOROMYOTOMY N/A 06/18/2017   Procedure: PYLOROMYOTOMY;  Surgeon: Grace Isaac, MD;  Location: [place];  Service: Thoracic;  Laterality: N/A;   TRANSURETHRAL RESECTION OF BLADDER TUMOR N/A 02/15/2021   Procedure: TRANSURETHRAL RESECTION OF BLADDER TUMOR (TURBT);  Surgeon: Abbie Sons, MD;  Location: [facility] ORS;  Service: Urology;  Laterality: N/A;   TRANSURETHRAL RESECTION OF BLADDER TUMOR WITH MITOMYCIN-C N/A 01/13/2020   Procedure: TRANSURETHRAL RESECTION OF BLADDER TUMOR WITH gemcitabine;  Surgeon: Abbie Sons, MD;  Location: [facility] ORS;  Service: Urology;  Laterality: N/A;  VIDEO BRONCHOSCOPY N/A 06/18/2017   Procedure: VIDEO BRONCHOSCOPY;  Surgeon: Grace Isaac, MD;  Location: [facility] OR;  Service: Thoracic;  Laterality: N/A;    Home Medications:  Allergies as of 02/23/2021       Reactions   Tramadol Other (See Comments)   Other reaction(s): Hallucination Pt states that it made crazy.        Medication List        Accurate as of February 23, 2021 11:59 PM. If you have any questions, ask your nurse or doctor.          multivitamin with minerals tablet Take 1 tablet by mouth daily.   oxyCODONE-acetaminophen 5-325 MG tablet Commonly known as: PERCOCET/ROXICET Take 1 tablet by mouth every 6 (six) hours as needed for severe pain.        Allergies:  Allergies  Allergen Reactions   Tramadol Other (See Comments)    Other reaction(s): Hallucination Pt states that it made crazy.    Family History: Family History  Problem Relation Age of Onset   Heart disease Father    Diabetes Mother    Hypertension Mother    Heart attack Brother 1   Prostate cancer Neg Hx    Bladder Cancer Neg Hx    Kidney cancer Neg Hx     Social History:  reports that he has never smoked. He has never used smokeless tobacco. He reports that he does not drink alcohol and does not use drugs.  ROS: Pertinent ROS in HPI  Physical Exam: Constitutional:  Well nourished. Alert and oriented, No acute distress. HEENT: [place] AT, mask in place.  Trachea midline Cardiovascular: No clubbing, cyanosis, or edema. Respiratory: Normal respiratory effort, no increased work of breathing. Neurologic: Grossly intact, no focal deficits, moving all 4 extremities. Psychiatric: Normal mood and affect.  Laboratory Data: Lab Results  Component Value Date   WBC 5.2 12/02/2020   HGB 14.1 12/02/2020   HCT 42.9 12/02/2020   MCV 92.3 12/02/2020   PLT 180 12/02/2020    Lab Results  Component Value Date   CREATININE 1.11 12/02/2020    Lab Results  Component Value Date   AST 14 (L) 12/02/2020   Lab Results  Component Value Date   ALT 17 12/02/2020    Urinalysis    Component Value Date/Time   COLORURINE YELLOW (A) 08/29/2020 1601   APPEARANCEUR Clear 02/17/2021 1010   LABSPEC >1.046 (H) 08/29/2020 1601   PHURINE 6.0 08/29/2020 1601   GLUCOSEU Trace (A) 02/17/2021 1010   HGBUR NEGATIVE 08/29/2020 1601   BILIRUBINUR Negative 02/17/2021 1010    KETONESUR 20 (A) 08/29/2020 1601   PROTEINUR 1+ (A) 02/17/2021 1010   PROTEINUR NEGATIVE 08/29/2020 1601   NITRITE Positive (A) 02/17/2021 1010   NITRITE NEGATIVE 08/29/2020 1601   LEUKOCYTESUR Trace (A) 02/17/2021 1010   LEUKOCYTESUR NEGATIVE 08/29/2020 1601  I have reviewed the labs.   Pertinent Imaging:  Most Recent  Scan Result [id number] 02/23/21 15:45   Simple Catheter Placement  Due to urinary retention patient is present today for a foley cath placement.  Patient was cleaned and prepped in a sterile fashion with betadine. A 16 Coude FR foley catheter was inserted, urine return was noted 600 ml, urine was yellow clear in color.  The balloon was filled with 10cc of sterile water.  A leg bag was attached for drainage. Patient was also given a night bag to take home and was given instruction on how to change from one bag  to another.  Patient was given instruction on proper catheter care.  Patient tolerated well, no complications were noted   Performed by: Nori Riis, PA-C and Rebeca Linward Natal, CMA  Assessment & Plan:    1. Postoperative urinary retention - Bladder Scan (Post Void Residual) in office -Patient failed his voiding trial -Foley catheter was replaced, the patient would like to be instructed in CIC -He would like to present sometime in the morning so that he can have a day of self cathing while the office is still open in case he runs into difficulty   Return for Follow-up in 1 week for voiding trial and CIC instruction.  These notes generated with voice recognition software. I apologize for typographical errors.  Zara Council, PA-C  [facility] [address]  [place], [place] [address] [phone]

## 2021-02-27 ENCOUNTER — Encounter: Payer: Self-pay | Admitting: Urology

## 2021-02-28 NOTE — Progress Notes (Signed)
03/01/2021 2:26 PM   Casey Acevedo February 21, 1941 [id number]  Referring provider: Idelle Crouch, MD [place] [facility],  [place] [address]  Chief Complaint  Patient presents with   Urinary Retention   Urological history: 1.  Bladder cancer -Low-grade urothelial carcinoma -Bladder mass seen incidentally on surveillance CTs of the chest, abdomen, pelvis in September 2021 for esophageal cancer -TURBT x 2 - most recent 02/2021  2.  Elevated PSA -PSA Trend  15.40 in 09/2020    7.91 in 12/2019    6.61 in 01/2019    4.8 in 08/2016    6.6 in 07/2016    5.6 in 06/2016     3. BPH with LU TS  HPI: Casey Acevedo is a 80 y.o. male who presents today for a voiding trial after going into urinary retention after his TURBT.  At his visit on February 23, 2021, he failed his voiding trial and catheter was replaced.  He presents this morning for catheter removal and to be instructed on CIC.  Catheter Removal Patient is present today for a catheter removal.  60 mL of sterile water was instilled into the bladder prior to Foley catheter removal.  9 ml of water was drained from the balloon. A 16 FR Coude foley cath was removed from the bladder no complications were noted . Patient tolerated well.  I then had the patient catheterize himself with a 14 French straight catheter for which he was successfully drained 60 cc of water that was still in the bladder.  He will attempt straight catheterization 1 more time before he returns at 2 PM for his PVR.  I instructed him to either cath himself if he is feeling uncomfortable and unable to void and record the residual or after spontaneous void catheterize himself and record the residual.  He presents this afternoon with a PVR of only 61 mL.  He was not able to void at all before 2:00 and he CIC for residual of 200 cc.  He states that is quite typical as he urinates mostly during the night.  He feels comfortable self  cathing at this time.  He did notice some blood after catheterization.  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 ([place]) 06/05/2017    Surgical History: Past Surgical History:  Procedure Laterality Date   CHOLECYSTECTOMY     COMPLETE ESOPHAGECTOMY N/A 06/18/2017   Procedure: cervical ESOPHAGECTOMY COMPLETE;  Surgeon: Grace Isaac, MD;  Location: [facility] OR;  Service: Thoracic;  Laterality: N/A;  NEED NIMS ET TUBE   ESOPHAGOGASTRODUODENOSCOPY (EGD) WITH PROPOFOL N/A 12/07/2016   Procedure: ESOPHAGOGASTRODUODENOSCOPY (EGD) WITH PROPOFOL;  Surgeon: Jonathon Bellows, MD;  Location: [facility] ENDOSCOPY;  Service: Gastroenterology;  Laterality: N/A;   ESOPHAGOGASTRODUODENOSCOPY (EGD) WITH PROPOFOL N/A 12/26/2016   Procedure: ESOPHAGOGASTRODUODENOSCOPY (EGD) WITH PROPOFOL WITH DILATION;  Surgeon: Jonathon Bellows, MD;  Location: [facility] ENDOSCOPY;  Service: Gastroenterology;  Laterality: N/A;   EYE SURGERY     GASTROJEJUNOSTOMY N/A 01/16/2017   Procedure: LAPROSCOPIC ASSIST FEEDING JEJUNOSTOMY TUBE;  Surgeon: Johnathan Hausen, MD;  [facility] ORS;  Service: General;  Laterality: N/A;   IR FLUORO GUIDE PORT INSERTION RIGHT  01/10/2017   IR REMOVAL TUN ACCESS W/ PORT W/O FL MOD SED  03/22/2018  IR REPLACE G-TUBE SIMPLE WO FLUORO  03/15/2017   IR [place] DUODEN/JEJUNO TUBE PERCUT W/FLUORO  03/05/2017   KYPHOPLASTY N/A 08/31/2020   Procedure: L1 KYPHOPLASTY;  Surgeon: Hessie Knows, MD;  Location: [facility] ORS;  Service: Orthopedics;  Laterality: N/A;   KYPHOPLASTY N/A 10/05/2020   Procedure: T12 KYPHOPLASTY;  Surgeon: Hessie Knows, MD;  Location: [facility] ORS;  Service: Orthopedics;  Laterality: N/A;   PICC LINE INSERTION Right    PYLOROMYOTOMY N/A 06/18/2017   Procedure: PYLOROMYOTOMY;  Surgeon: Grace Isaac, MD;  Location: [place];  Service: Thoracic;  Laterality: N/A;   TRANSURETHRAL RESECTION OF BLADDER TUMOR N/A 02/15/2021   Procedure: TRANSURETHRAL RESECTION OF BLADDER TUMOR (TURBT);  Surgeon: Abbie Sons, MD;  Location: [facility] ORS;  Service: Urology;  Laterality: N/A;   TRANSURETHRAL RESECTION OF BLADDER TUMOR WITH MITOMYCIN-C N/A 01/13/2020   Procedure: TRANSURETHRAL RESECTION OF BLADDER TUMOR WITH gemcitabine;  Surgeon: Abbie Sons, MD;  Location: [facility] ORS;  Service: Urology;  Laterality: N/A;   VIDEO BRONCHOSCOPY N/A 06/18/2017   Procedure: VIDEO BRONCHOSCOPY;  Surgeon: Grace Isaac, MD;  Location: [facility] OR;  Service: Thoracic;  Laterality: N/A;    Home Medications:  Allergies as of 03/01/2021       Reactions   Tramadol Other (See Comments)   Other reaction(s): Hallucination Pt states that it made crazy.        Medication List        Accurate as of March 01, 2021  2:26 PM. If you have any questions, ask your nurse or doctor.          multivitamin with minerals tablet Take 1 tablet by mouth daily.   oxyCODONE-acetaminophen 5-325 MG tablet Commonly known as: PERCOCET/ROXICET Take 1 tablet by mouth every 6 (six) hours as needed for severe pain.   tamsulosin 0.4 MG Caps capsule Commonly known as: FLOMAX Take 1 capsule (0.4 mg total) by mouth daily. Started by: Zara Council, PA-C        Allergies:  Allergies  Allergen Reactions   Tramadol Other (See Comments)    Other reaction(s): Hallucination Pt states that it made crazy.    Family History: Family History  Problem Relation Age of Onset   Heart disease Father    Diabetes Mother    Hypertension Mother    Heart attack Brother 66   Prostate cancer Neg Hx    Bladder Cancer Neg Hx    Kidney cancer Neg Hx     Social History:  reports that he has never smoked. He has never used smokeless tobacco. He reports that he does not drink alcohol and does not use drugs.  ROS: Pertinent ROS in  HPI  Physical Exam: Blood pressure (!) 158/95, pulse (!) 102, height _0  (1.778 m), weight 154 lb (69.9 kg).  Constitutional:  Well nourished. Alert and oriented, No acute distress. HEENT: [place] AT, mask in place.  Trachea midline Cardiovascular: No clubbing, cyanosis, or edema. Respiratory: Normal respiratory effort, no increased work of breathing. Neurologic: Grossly intact, no focal deficits, moving all 4 extremities. Psychiatric: Normal mood and affect.   Laboratory Data: N/A    Pertinent Imaging:  Most Recent  Scan Result 61 03/01/21 13:54     Assessment & Plan:    1. Postoperative urinary retention -Bladder Scan (Post Void Residual) in office -PVR is minimal at 61 mL, but patient had to catheterize for return of 200 cc and had no spontaneous voids prior to this afternoon appointment -He will continue  to straight cath 3 times daily due to urinary retention indefinitely -Tamsulosin 0.4 mg daily sent to pharmacy  Return in about 2 weeks (around 03/15/2021) for For recheck.  These notes generated with voice recognition software. I apologize for typographical errors.  Zara Council, PA-C  [facility] [address]  [place], [place] [address] [phone]   I spent 30 minutes on the day of the encounter to include pre-visit record review, face-to-face time with the patient, and post-visit ordering of tests.

## 2021-03-01 ENCOUNTER — Ambulatory Visit: Payer: Medicare Other | Admitting: Urology

## 2021-03-01 ENCOUNTER — Other Ambulatory Visit: Payer: Self-pay

## 2021-03-01 ENCOUNTER — Encounter: Payer: Self-pay | Admitting: Urology

## 2021-03-01 ENCOUNTER — Ambulatory Visit (INDEPENDENT_AMBULATORY_CARE_PROVIDER_SITE_OTHER): Payer: Medicare Other | Admitting: Urology

## 2021-03-01 VITALS — BP 158/95 | HR 102 | Ht 70.0 in | Wt 154.0 lb

## 2021-03-01 DIAGNOSIS — N9989 Other postprocedural complications and disorders of genitourinary system: Secondary | ICD-10-CM | POA: Diagnosis not present

## 2021-03-01 DIAGNOSIS — R338 Other retention of urine: Secondary | ICD-10-CM | POA: Diagnosis not present

## 2021-03-01 LAB — BLADDER SCAN AMB NON-IMAGING: Scan Result: 61

## 2021-03-01 MED ORDER — TAMSULOSIN HCL 0.4 MG PO CAPS: 0.4000 mg | ORAL_CAPSULE | Freq: Every day | ORAL | 3 refills | Status: DC

## 2021-03-02 ENCOUNTER — Encounter: Payer: Self-pay | Admitting: Urology

## 2021-03-02 ENCOUNTER — Ambulatory Visit (INDEPENDENT_AMBULATORY_CARE_PROVIDER_SITE_OTHER): Payer: Medicare Other | Admitting: Urology

## 2021-03-02 ENCOUNTER — Other Ambulatory Visit: Payer: Self-pay

## 2021-03-02 DIAGNOSIS — N9989 Other postprocedural complications and disorders of genitourinary system: Secondary | ICD-10-CM

## 2021-03-02 DIAGNOSIS — R338 Other retention of urine: Secondary | ICD-10-CM | POA: Diagnosis not present

## 2021-03-02 DIAGNOSIS — R310 Gross hematuria: Secondary | ICD-10-CM

## 2021-03-02 LAB — BLADDER SCAN AMB NON-IMAGING

## 2021-03-02 MED ORDER — SULFAMETHOXAZOLE-TRIMETHOPRIM 800-160 MG PO TABS: 1.0000 | ORAL_TABLET | Freq: Two times a day (BID) | ORAL | 0 refills | Status: DC

## 2021-03-02 NOTE — Progress Notes (Addendum)
03/02/2021 8:38 AM   Casey Acevedo 06-18-1940 [id number]  Referring provider: Idelle Crouch, MD [place] [facility] [place],  [place] [address]  Chief Complaint  Patient presents with   Urinary Retention   Urological history: 1.  Bladder cancer -Low-grade urothelial carcinoma -Bladder mass seen incidentally on surveillance CTs of the chest, abdomen, pelvis in September 2021 for esophageal cancer -TURBT x 2 - most recent 02/2021  2.  Elevated PSA -PSA Trend  15.40 in 09/2020    7.91 in 12/2019    6.61 in 01/2019    4.8 in 08/2016    6.6 in 07/2016    5.6 in 06/2016     3. BPH with LU TS  HPI: Casey Acevedo is a 80 y.o. male who presents today after of night of being unable to spontaneously void and unable to pass a straight cath.    At his visit on February 23, 2021, he failed his voiding trial and catheter was replaced.  He presents this morning for catheter removal and to be instructed on CIC.  He presented to the office this morning and urinary retention.  He states that he was unable to pass the catheter to get urine out.  He states he put the straight cath and all the way to the hub, but return to nothing but blood.  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 ([place]) 06/05/2017    Surgical History: Past Surgical History:  Procedure Laterality Date   CHOLECYSTECTOMY     COMPLETE ESOPHAGECTOMY N/A 06/18/2017   Procedure: cervical ESOPHAGECTOMY COMPLETE;  Surgeon: Grace Isaac, MD;  Location: [facility] OR;  Service: Thoracic;  Laterality: N/A;  NEED NIMS ET TUBE   ESOPHAGOGASTRODUODENOSCOPY (EGD) WITH PROPOFOL N/A 12/07/2016   Procedure: ESOPHAGOGASTRODUODENOSCOPY (EGD) WITH PROPOFOL;  Surgeon: Jonathon Bellows, MD;  Location: [facility] ENDOSCOPY;  Service: Gastroenterology;  Laterality: N/A;   ESOPHAGOGASTRODUODENOSCOPY (EGD) WITH PROPOFOL N/A 12/26/2016   Procedure: ESOPHAGOGASTRODUODENOSCOPY (EGD) WITH PROPOFOL WITH DILATION;  Surgeon: Jonathon Bellows, MD;  Location: [facility] ENDOSCOPY;  Service: Gastroenterology;  Laterality: N/A;   EYE SURGERY     GASTROJEJUNOSTOMY N/A 01/16/2017   Procedure: LAPROSCOPIC ASSIST FEEDING JEJUNOSTOMY TUBE;  Surgeon: Johnathan Hausen, MD;  [facility];  Service: General;  Laterality: N/A;   IR FLUORO GUIDE PORT INSERTION RIGHT  01/10/2017   IR REMOVAL TUN ACCESS W/ PORT W/O FL MOD SED  03/22/2018   IR REPLACE G-TUBE SIMPLE WO FLUORO  03/15/2017   IR [place] DUODEN/JEJUNO TUBE PERCUT W/FLUORO  03/05/2017   KYPHOPLASTY N/A 08/31/2020   Procedure: L1 KYPHOPLASTY;  Surgeon: Hessie Knows, MD;  Location: [facility] ORS;  Service: Orthopedics;  Laterality: N/A;   KYPHOPLASTY N/A 10/05/2020   Procedure: T12 KYPHOPLASTY;  Surgeon: Hessie Knows, MD;  Location: [facility] ORS;  Service: Orthopedics;  Laterality: N/A;   PICC LINE INSERTION Right    PYLOROMYOTOMY N/A 06/18/2017   Procedure: PYLOROMYOTOMY;  Surgeon: Grace Isaac, MD;  Location: [place];  Service: Thoracic;  Laterality: N/A;   TRANSURETHRAL RESECTION OF BLADDER TUMOR N/A 02/15/2021   Procedure: TRANSURETHRAL RESECTION OF BLADDER TUMOR (TURBT);  Surgeon: Abbie Sons, MD;  Location: [facility] ORS;  Service: Urology;  Laterality: N/A;  TRANSURETHRAL RESECTION OF BLADDER TUMOR WITH MITOMYCIN-C N/A 01/13/2020   Procedure: TRANSURETHRAL RESECTION OF BLADDER TUMOR WITH gemcitabine;  Surgeon: Abbie Sons, MD;  Location: [facility] ORS;  Service: Urology;  Laterality: N/A;   VIDEO BRONCHOSCOPY N/A 06/18/2017   Procedure: VIDEO BRONCHOSCOPY;  Surgeon: Grace Isaac, MD;  Location: [facility] OR;  Service: Thoracic;  Laterality: N/A;    Home Medications:  Allergies as of 03/02/2021       Reactions   Tramadol Other (See Comments)   Other  reaction(s): Hallucination Pt states that it made crazy.        Medication List        Accurate as of March 02, 2021  8:38 AM. If you have any questions, ask your nurse or doctor.          multivitamin with minerals tablet Take 1 tablet by mouth daily.   oxyCODONE-acetaminophen 5-325 MG tablet Commonly known as: PERCOCET/ROXICET Take 1 tablet by mouth every 6 (six) hours as needed for severe pain.   sulfamethoxazole-trimethoprim 800-160 MG tablet Commonly known as: BACTRIM DS Take 1 tablet by mouth every 12 (twelve) hours. Started by: Zara Council, PA-C   tamsulosin 0.4 MG Caps capsule Commonly known as: FLOMAX Take 1 capsule (0.4 mg total) by mouth daily.        Allergies:  Allergies  Allergen Reactions   Tramadol Other (See Comments)    Other reaction(s): Hallucination Pt states that it made crazy.    Family History: Family History  Problem Relation Age of Onset   Heart disease Father    Diabetes Mother    Hypertension Mother    Heart attack Brother 46   Prostate cancer Neg Hx    Bladder Cancer Neg Hx    Kidney cancer Neg Hx     Social History:  reports that he has never smoked. He has never used smokeless tobacco. He reports that he does not drink alcohol and does not use drugs.  ROS: Pertinent ROS in HPI  Physical Exam: Constitutional:  Well nourished. Alert and oriented, No acute distress. HEENT: [place] AT, mask in place.  Trachea midline Cardiovascular: No clubbing, cyanosis, or edema. Respiratory: Normal respiratory effort, no increased work of breathing. GU: No CVA tenderness.  No bladder fullness or masses.  Patient with circumcised phallus.  Urethral meatus is patent.  No penile discharge. No penile lesions or rashes. Scrotum without lesions, cysts, rashes and/or edema.   Neurologic: Grossly intact, no focal deficits, moving all 4 extremities. Psychiatric: Normal mood and affect.   Laboratory Data: N/A    Pertinent Imaging:  Most  Recent  Scan Result 562m 03/02/21 08:17   Simple Catheter Placement Due to urinary retention patient is present today for a foley cath placement.  Patient was cleaned and prepped in a sterile fashion with betadine. A 16 FR Coude foley catheter was inserted, urine return was noted  500 ml, urine was red brown in color.  The balloon was filled with 10cc of sterile water.  A leg bag was attached for drainage. Patient was also given a night bag to take home and was given instruction on how to change from one bag to another.  Patient was given instruction on proper catheter care.  Patient tolerated well, no complications were noted    Assessment & Plan:    1. Postoperative urinary retention -Bladder Scan (Post Void Residual) in office -Discussed with patient and he no longer wants to try self-catheterization and wants a Foley placed which  was done today -He will continue tamsulosin 0.4 mg daily -I have also sent a prescription of Septra DS, twice daily x3 days due to the amount of manipulation he has undergone the last few days  2. Gross hematuria -Explained the hematuria is likely due to irritation of the urethra and prostate with his attempts at self-catheterization -Reviewed return precautions with the patient  Return in about 3 weeks (around 03/23/2021) for TOV and PVR .  These notes generated with voice recognition software. I apologize for typographical errors.  Zara Council, PA-C  [facility] [address]  [place], [place] [address] [phone]

## 2021-03-18 ENCOUNTER — Ambulatory Visit: Payer: Medicare Other | Admitting: Urology

## 2021-03-22 NOTE — Progress Notes (Signed)
03/23/2021 9:05 PM   Casey Acevedo 02-10-1941 [id number]  Referring provider: Idelle Crouch, MD [facility] [place],  [place] [address]  Chief Complaint  Patient presents with   Urinary Retention   Urological history: 1.  Bladder cancer -Low-grade urothelial carcinoma -Bladder mass seen incidentally on surveillance CTs of the chest, abdomen, pelvis in September 2021 for esophageal cancer -TURBT x 2 - most recent 02/2021 -Surveillance cystoscopy scheduled for May 20, 2021 with Dr. Bernardo Heater  2.  Elevated PSA -PSA Trend  15.40 in 09/2020    7.91 in 12/2019    6.61 in 01/2019    4.8 in 08/2016    6.6 in 07/2016    5.6 in 06/2016     3. BPH with LU TS -Managed with tamsulosin 0.4 mg daily  HPI: Casey Acevedo is a 81 y.o. male who went into postoperative urinary retention after undergoing a TURBT on February 15, 2021.    He has failed voiding trial x2.  He also failed CIC.  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 ([place]) 06/05/2017    Surgical History: Past Surgical History:  Procedure Laterality Date   CHOLECYSTECTOMY     COMPLETE ESOPHAGECTOMY N/A 06/18/2017   Procedure: cervical ESOPHAGECTOMY COMPLETE;  Surgeon: Grace Isaac, MD;  Location: [facility] OR;  Service: Thoracic;  Laterality: N/A;  NEED NIMS ET TUBE   ESOPHAGOGASTRODUODENOSCOPY (EGD) WITH PROPOFOL N/A 12/07/2016   Procedure: ESOPHAGOGASTRODUODENOSCOPY (EGD) WITH PROPOFOL;  Surgeon: Jonathon Bellows, MD;  Location: [facility] ENDOSCOPY;  Service: Gastroenterology;  Laterality: N/A;   ESOPHAGOGASTRODUODENOSCOPY (EGD) WITH PROPOFOL N/A 12/26/2016   Procedure: ESOPHAGOGASTRODUODENOSCOPY (EGD) WITH PROPOFOL WITH DILATION;  Surgeon: Jonathon Bellows, MD;  Location: [facility]  ENDOSCOPY;  Service: Gastroenterology;  Laterality: N/A;   EYE SURGERY     GASTROJEJUNOSTOMY N/A 01/16/2017   Procedure: LAPROSCOPIC ASSIST FEEDING JEJUNOSTOMY TUBE;  Surgeon: Johnathan Hausen, MD;  Location: [facility] ORS;  Service: General;  Laterality: N/A;   IR FLUORO GUIDE PORT INSERTION RIGHT  01/10/2017   IR REMOVAL TUN ACCESS W/ PORT W/O FL MOD SED  03/22/2018   IR REPLACE G-TUBE SIMPLE WO FLUORO  03/15/2017   IR [place] DUODEN/JEJUNO TUBE PERCUT W/FLUORO  03/05/2017   KYPHOPLASTY N/A 08/31/2020   Procedure: L1 KYPHOPLASTY;  Surgeon: Hessie Knows, MD;  Location: [facility] ORS;  Service: Orthopedics;  Laterality: N/A;   KYPHOPLASTY N/A 10/05/2020   Procedure: T12 KYPHOPLASTY;  Surgeon: Hessie Knows, MD;  Location: [facility] ORS;  Service: Orthopedics;  Laterality: N/A;   PICC LINE INSERTION Right    PYLOROMYOTOMY N/A 06/18/2017   Procedure: PYLOROMYOTOMY;  Surgeon: Grace Isaac, MD;  Location: [place];  Service: Thoracic;  Laterality: N/A;   TRANSURETHRAL RESECTION OF BLADDER TUMOR N/A 02/15/2021   Procedure: TRANSURETHRAL RESECTION OF BLADDER TUMOR (TURBT);  Surgeon: Abbie Sons, MD;  Location: [facility] ORS;  Service: Urology;  Laterality: N/A;   TRANSURETHRAL RESECTION OF BLADDER TUMOR WITH MITOMYCIN-C N/A 01/13/2020   Procedure: TRANSURETHRAL RESECTION OF BLADDER TUMOR WITH gemcitabine;  Surgeon: Abbie Sons, MD;  Location: [facility] ORS;  Service: Urology;  Laterality: N/A;   VIDEO BRONCHOSCOPY N/A 06/18/2017   Procedure: VIDEO BRONCHOSCOPY;  Surgeon: Grace Isaac, MD;  Location: [place];  Service: Thoracic;  Laterality:  N/A;    Home Medications:  Allergies as of 03/23/2021       Reactions   Tramadol Other (See Comments)   Other reaction(s): Hallucination Pt states that it made crazy.        Medication List        Accurate as of March 23, 2021 11:59 PM. If you have any questions, ask your nurse or doctor.          multivitamin with minerals tablet Take 1 tablet by mouth daily.    oxyCODONE-acetaminophen 5-325 MG tablet Commonly known as: PERCOCET/ROXICET Take 1 tablet by mouth every 6 (six) hours as needed for severe pain.   sulfamethoxazole-trimethoprim 800-160 MG tablet Commonly known as: BACTRIM DS Take 1 tablet by mouth every 12 (twelve) hours.   tamsulosin 0.4 MG Caps capsule Commonly known as: FLOMAX Take 1 capsule (0.4 mg total) by mouth daily.        Allergies:  Allergies  Allergen Reactions   Tramadol Other (See Comments)    Other reaction(s): Hallucination Pt states that it made crazy.    Family History: Family History  Problem Relation Age of Onset   Heart disease Father    Diabetes Mother    Hypertension Mother    Heart attack Brother 35   Prostate cancer Neg Hx    Bladder Cancer Neg Hx    Kidney cancer Neg Hx     Social History:  reports that he has never smoked. He has never used smokeless tobacco. He reports that he does not drink alcohol and does not use drugs.  ROS: Pertinent ROS in HPI  Physical Exam: Constitutional:  Well nourished. Alert and oriented, No acute distress. HEENT: [place] AT, mask in place.  Trachea midline Cardiovascular: No clubbing, cyanosis, or edema. Respiratory: Normal respiratory effort, no increased work of breathing. Neurologic: Grossly intact, no focal deficits, moving all 4 extremities. Psychiatric: Normal mood and affect.   Laboratory Data: N/A  Pertinent Imaging:  03/23/21 15:37  Scan Result 465m    Catheter Removal Patient is present today for a catheter removal.  9 ml of water was drained from the balloon. A 16 FR Coude foley cath was removed from the bladder no complications were noted . Patient tolerated well.  Simple Catheter Placement Due to urinary retention patient is present today for a foley cath placement.  Patient was cleaned and prepped in a sterile fashion with betadine. A 16 FR coude foley catheter was inserted, urine return was noted  500 ml, urine was yellow in color.   The balloon was filled with 10cc of sterile water.  A leg bag was attached for drainage. Patient was also given a night bag to take home and was given instruction on how to change from one bag to another.  Patient was given instruction on proper catheter care.  Patient tolerated well, no complications were noted    Assessment & Plan:    1. Postoperative urinary retention -patient has failed another TOV -Foley catheter in place -will refer on to [facility] for UDS as recent cysto did not demonstrate significant obstruction -he would like to have another TOV in one week   2. Gross hematuria -resolved   Return in about 1 week (around 03/30/2021) for TOV and PVR .  These notes generated with voice recognition software. I apologize for typographical errors.  SZara Council PA-C  [facility] [address] [place] [phone]

## 2021-03-23 ENCOUNTER — Other Ambulatory Visit: Payer: Self-pay

## 2021-03-23 ENCOUNTER — Ambulatory Visit (INDEPENDENT_AMBULATORY_CARE_PROVIDER_SITE_OTHER): Payer: Medicare Other | Admitting: Urology

## 2021-03-23 ENCOUNTER — Ambulatory Visit: Payer: Medicare Other | Admitting: Urology

## 2021-03-23 DIAGNOSIS — R310 Gross hematuria: Secondary | ICD-10-CM

## 2021-03-23 DIAGNOSIS — R338 Other retention of urine: Secondary | ICD-10-CM

## 2021-03-23 DIAGNOSIS — N9989 Other postprocedural complications and disorders of genitourinary system: Secondary | ICD-10-CM | POA: Diagnosis not present

## 2021-03-23 LAB — BLADDER SCAN AMB NON-IMAGING

## 2021-03-24 ENCOUNTER — Encounter: Payer: Self-pay | Admitting: Urology

## 2021-03-30 NOTE — Progress Notes (Signed)
03/31/2021 9:27 PM   Casey Acevedo 08-02-1940 [id number]  Referring provider: Idelle Crouch, MD [place] [facility] [place],  [place] [address]  Chief Complaint  Patient presents with   Urinary Retention   Urological history: 1.  Bladder cancer -Low-grade urothelial carcinoma -Bladder mass seen incidentally on surveillance CTs of the chest, abdomen, pelvis in September 2021 for esophageal cancer -TURBT x 2 - most recent 02/2021 -Surveillance cystoscopy scheduled for May 20, 2021 with Dr. Bernardo Heater  2.  Elevated PSA -PSA Trend  15.40 in 09/2020    7.91 in 12/2019    6.61 in 01/2019    4.8 in 08/2016    6.6 in 07/2016    5.6 in 06/2016     3. BPH with LU TS -Managed with tamsulosin 0.4 mg daily  4. Urinary retention -post-operative after TURBT 02/2021 -currently managed with indwelling Foley as patient could not perform CIC and has failed TOV x several -UDS pending    HPI: Casey Acevedo is a 81 y.o. male who went into postoperative urinary retention after undergoing a TURBT on February 15, 2021.    He has failed voiding trial x3.  He also failed CIC.  Has been referred for UDS.   Catheter Removal Patient is present today for a catheter removal.  9 ml of water was drained from the balloon. A 16 FR Coude foley cath was removed from the bladder no complications were noted . Patient tolerated well.  When he returned this afternoon, he was having urinary frequency.  Unfortunately, he was voiding just small amounts and his PVR was found to be 448 mL.    Patient denies any modifying or aggravating factors.  Patient denies any gross hematuria, dysuria or suprapubic/flank pain.  Patient denies any fevers, chills, nausea or vomiting.    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 ([place]) 06/05/2017    Surgical History: Past Surgical History:  Procedure Laterality Date   CHOLECYSTECTOMY     COMPLETE ESOPHAGECTOMY N/A 06/18/2017   Procedure: cervical ESOPHAGECTOMY COMPLETE;  Surgeon: Grace Isaac, MD;  Location: [facility] OR;  Service: Thoracic;  Laterality: N/A;  NEED NIMS ET TUBE   ESOPHAGOGASTRODUODENOSCOPY (EGD) WITH PROPOFOL N/A 12/07/2016   Procedure: ESOPHAGOGASTRODUODENOSCOPY (EGD) WITH PROPOFOL;  Surgeon: Jonathon Bellows, MD;  Location: [facility] ENDOSCOPY;  Service: Gastroenterology;  Laterality: N/A;   ESOPHAGOGASTRODUODENOSCOPY (EGD) WITH PROPOFOL N/A 12/26/2016   Procedure: ESOPHAGOGASTRODUODENOSCOPY (EGD) WITH PROPOFOL WITH DILATION;  Surgeon: Jonathon Bellows, MD;  Location: [facility] ENDOSCOPY;  Service: Gastroenterology;  Laterality: N/A;   EYE SURGERY     GASTROJEJUNOSTOMY N/A 01/16/2017   Procedure: LAPROSCOPIC ASSIST FEEDING JEJUNOSTOMY TUBE;  Surgeon: Johnathan Hausen, MD;  [facility];  Service: General;  Laterality: N/A;   IR FLUORO GUIDE PORT INSERTION RIGHT  01/10/2017   IR REMOVAL TUN ACCESS W/ PORT W/O FL MOD SED  03/22/2018   IR REPLACE G-TUBE SIMPLE WO FLUORO  03/15/2017   IR [place] DUODEN/JEJUNO TUBE PERCUT W/FLUORO  03/05/2017   KYPHOPLASTY N/A 08/31/2020   Procedure: L1 KYPHOPLASTY;  Surgeon: Hessie Knows, MD;  Location: [facility] ORS;  Service: Orthopedics;  Laterality: N/A;   KYPHOPLASTY N/A 10/05/2020   Procedure: T12 KYPHOPLASTY;  Surgeon: Hessie Knows, MD;  Location: [facility] ORS;  Service: Orthopedics;  Laterality: N/A;   PICC LINE INSERTION Right    PYLOROMYOTOMY N/A 06/18/2017   Procedure: PYLOROMYOTOMY;  Surgeon: Grace Isaac, MD;  Location: [place];  Service: Thoracic;  Laterality: N/A;   TRANSURETHRAL RESECTION OF BLADDER TUMOR N/A 02/15/2021   Procedure: TRANSURETHRAL RESECTION OF BLADDER TUMOR (TURBT);  Surgeon: Abbie Sons, MD;  Location: [facility] ORS;  Service: Urology;  Laterality: N/A;    TRANSURETHRAL RESECTION OF BLADDER TUMOR WITH MITOMYCIN-C N/A 01/13/2020   Procedure: TRANSURETHRAL RESECTION OF BLADDER TUMOR WITH gemcitabine;  Surgeon: Abbie Sons, MD;  Location: [facility] ORS;  Service: Urology;  Laterality: N/A;   VIDEO BRONCHOSCOPY N/A 06/18/2017   Procedure: VIDEO BRONCHOSCOPY;  Surgeon: Grace Isaac, MD;  Location: [facility] OR;  Service: Thoracic;  Laterality: N/A;    Home Medications:  Allergies as of 03/31/2021       Reactions   Tramadol Other (See Comments)   Other reaction(s): Hallucination Pt states that it made crazy.        Medication List        Accurate as of March 31, 2021  9:27 PM. If you have any questions, ask your nurse or doctor.          multivitamin with minerals tablet Take 1 tablet by mouth daily.   oxyCODONE-acetaminophen 5-325 MG tablet Commonly known as: PERCOCET/ROXICET Take 1 tablet by mouth every 6 (six) hours as needed for severe pain.   sulfamethoxazole-trimethoprim 800-160 MG tablet Commonly known as: BACTRIM DS Take 1 tablet by mouth every 12 (twelve) hours.   tamsulosin 0.4 MG Caps capsule Commonly known as: FLOMAX Take 1 capsule (0.4 mg total) by mouth daily.        Allergies:  Allergies  Allergen Reactions   Tramadol Other (See Comments)    Other reaction(s): Hallucination Pt states that it made crazy.    Family History: Family History  Problem Relation Age of Onset   Heart disease Father    Diabetes Mother    Hypertension Mother    Heart attack Brother 73   Prostate cancer Neg Hx    Bladder Cancer Neg Hx    Kidney cancer Neg Hx     Social History:  reports that he has never smoked. He has never used smokeless tobacco. He reports that he does not drink alcohol and does not use drugs.  ROS: Pertinent ROS in HPI  Physical Exam: Constitutional:  Well nourished. Alert and oriented, No acute distress. HEENT: [place] AT, mask in place.  Trachea midline Cardiovascular: No clubbing, cyanosis, or  edema. Respiratory: Normal respiratory effort, no increased work of breathing. GU: No CVA tenderness.  No bladder fullness or masses.  Patient with circumcised phallus.  Urethral meatus is patent.  No penile discharge. No penile lesions or rashes. Scrotum without lesions, cysts, rashes and/or edema.   Neurologic: Grossly intact, no focal deficits, moving all 4 extremities. Psychiatric: Normal mood and affect.   Laboratory Data: N/A  Pertinent Imaging:  03/31/21 15:20  Scan Result 448m    Simple Catheter Placement Due to urinary retention patient is present today for a foley cath placement.  Patient was cleaned and prepped in a sterile fashion with betadine. A 16 FR Coude foley catheter was inserted, urine return was noted  500 ml, urine was yellow in color.  The balloon was filled with 10cc of sterile water.  A leg bag was attached for drainage. Patient was also given a night bag to take home and was given  instruction on how to change from one bag to another.  Patient was given instruction on proper catheter care.  Patient tolerated well, no complications were noted   Assessment & Plan:    1. Postoperative urinary retention -continue tamsulosin 0.4 mg daily -Foley in place -UDS scheduled for next Tuesday  2. Gross hematuria -resolved   Return in about 1 week (around 04/07/2021) for UDS report .  These notes generated with voice recognition software. I apologize for typographical errors.  Zara Council, PA-C  [facility] [address]  [place], [place] [address] [phone]

## 2021-03-31 ENCOUNTER — Other Ambulatory Visit: Payer: Self-pay

## 2021-03-31 ENCOUNTER — Ambulatory Visit (INDEPENDENT_AMBULATORY_CARE_PROVIDER_SITE_OTHER): Payer: Medicare Other | Admitting: Urology

## 2021-03-31 ENCOUNTER — Ambulatory Visit: Payer: Medicare Other | Admitting: Urology

## 2021-03-31 ENCOUNTER — Encounter: Payer: Self-pay | Admitting: Urology

## 2021-03-31 DIAGNOSIS — N9989 Other postprocedural complications and disorders of genitourinary system: Secondary | ICD-10-CM | POA: Diagnosis not present

## 2021-03-31 DIAGNOSIS — R338 Other retention of urine: Secondary | ICD-10-CM | POA: Diagnosis not present

## 2021-03-31 LAB — BLADDER SCAN AMB NON-IMAGING

## 2021-04-06 ENCOUNTER — Other Ambulatory Visit: Payer: Self-pay | Admitting: Urology

## 2021-04-06 NOTE — Progress Notes (Signed)
04/07/2021 4:37 PM   Casey Acevedo [birth year] [id number]  Referring provider: Idelle Crouch, MD [place] [place],  [place] [address]  Chief Complaint  Patient presents with   Results   Urological history: 1.  Bladder cancer -Low-grade urothelial carcinoma -Bladder mass seen incidentally on surveillance CTs of the chest, abdomen, pelvis in September 2021 for esophageal cancer -TURBT x 2 - most recent 02/2021 -Surveillance cystoscopy scheduled for May 20, 2021 with Dr. Bernardo Heater  2.  Elevated PSA -PSA Trend  15.40 in 09/2020    7.91 in 12/2019    6.61 in 01/2019    4.8 in 08/2016    6.6 in 07/2016    5.6 in 06/2016     3. BPH with LU TS -Managed with tamsulosin 0.4 mg daily  4. Urinary retention -post-operative after TURBT 02/2021 -currently managed with indwelling Foley as patient could not perform CIC and has failed TOV x several -UDS pending    HPI: Casey Acevedo is a 81 y.o. male who presents today to review urodynamics results.  UDS summary: Max capacity of approximately 171 mL.  First sensation felt at 93 mL.  Pain was felt in the penis when bladder was full and no instability was noted.  Was able to generate a voluntary contraction and void a very small amount of 2 mL with a max flow of 2 mL/s.  Bearing-down to facilitate further emptying of the bladder was unsuccessful.  EMG leads were basically quiet during voiding.  PVR was approximately 171 mL.  No trabeculation was noted.  No reflux was noted.   PMH: Past Medical History:  Diagnosis Date   Compression fracture of thoracic spine, non-traumatic ([place]) 01/28/2018   Dysphagia    Esophageal cancer () 2018   Chemo + Rad tx's with surgical resection.    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 ([place]) 06/05/2017    Surgical History: Past Surgical History:  Procedure Laterality Date   CHOLECYSTECTOMY     COMPLETE ESOPHAGECTOMY N/A 06/18/2017   Procedure:  cervical ESOPHAGECTOMY COMPLETE;  Surgeon: Grace Isaac, MD;  Location: [facility] OR;  Service: Thoracic;  Laterality: N/A;  NEED NIMS ET TUBE   ESOPHAGOGASTRODUODENOSCOPY (EGD) WITH PROPOFOL N/A 12/07/2016   Procedure: ESOPHAGOGASTRODUODENOSCOPY (EGD) WITH PROPOFOL;  Surgeon: Jonathon Bellows, MD;  Location: [facility] ENDOSCOPY;  Service: Gastroenterology;  Laterality: N/A;   ESOPHAGOGASTRODUODENOSCOPY (EGD) WITH PROPOFOL N/A 12/26/2016   Procedure: ESOPHAGOGASTRODUODENOSCOPY (EGD) WITH PROPOFOL WITH DILATION;  Surgeon: Jonathon Bellows, MD;  Location: [facility] ENDOSCOPY;  Service: Gastroenterology;  Laterality: N/A;   EYE SURGERY     GASTROJEJUNOSTOMY N/A 01/16/2017   Procedure: LAPROSCOPIC ASSIST FEEDING JEJUNOSTOMY TUBE;  Surgeon: Johnathan Hausen, MD;  [facility];  Service: General;  Laterality: N/A;   IR FLUORO GUIDE PORT INSERTION RIGHT  01/10/2017   IR REMOVAL TUN ACCESS W/ PORT W/O FL MOD SED  03/22/2018   IR REPLACE G-TUBE SIMPLE WO FLUORO  03/15/2017   IR [place] DUODEN/JEJUNO TUBE PERCUT W/FLUORO  03/05/2017   KYPHOPLASTY N/A 08/31/2020   Procedure: L1 KYPHOPLASTY;  Surgeon: Hessie Knows, MD;  Location: [facility] ORS;  Service: Orthopedics;  Laterality: N/A;   KYPHOPLASTY N/A 10/05/2020   Procedure: T12 KYPHOPLASTY;  Surgeon: Hessie Knows, MD;  Location: [facility] ORS;  Service: Orthopedics;  Laterality: N/A;   PICC LINE INSERTION Right    PYLOROMYOTOMY N/A 06/18/2017   Procedure: PYLOROMYOTOMY;  Surgeon: Grace Isaac, MD;  Location: [place];  Service: Thoracic;  Laterality: N/A;   TRANSURETHRAL RESECTION OF BLADDER TUMOR N/A 02/15/2021   Procedure: TRANSURETHRAL RESECTION OF BLADDER TUMOR (TURBT);  Surgeon: Abbie Sons, MD;  Location: [facility] ORS;  Service: Urology;  Laterality: N/A;   TRANSURETHRAL RESECTION OF BLADDER TUMOR WITH MITOMYCIN-C N/A 01/13/2020   Procedure: TRANSURETHRAL RESECTION OF BLADDER TUMOR WITH gemcitabine;  Surgeon: Abbie Sons, MD;  Location: [facility] ORS;  Service: Urology;  Laterality:  N/A;   VIDEO BRONCHOSCOPY N/A 06/18/2017   Procedure: VIDEO BRONCHOSCOPY;  Surgeon: Grace Isaac, MD;  Location: [facility] OR;  Service: Thoracic;  Laterality: N/A;    Home Medications:  Allergies as of 04/07/2021       Reactions   Tramadol Other (See Comments)   Other reaction(s): Hallucination Pt states that it made crazy.        Medication List        Accurate as of April 07, 2021 11:59 PM. If you have any questions, ask your nurse or doctor.          multivitamin with minerals tablet Take 1 tablet by mouth daily.   oxyCODONE-acetaminophen 5-325 MG tablet Commonly known as: PERCOCET/ROXICET Take 1 tablet by mouth every 6 (six) hours as needed for severe pain.   sulfamethoxazole-trimethoprim 800-160 MG tablet Commonly known as: BACTRIM DS Take 1 tablet by mouth every 12 (twelve) hours.   tamsulosin 0.4 MG Caps capsule Commonly known as: FLOMAX Take 1 capsule (0.4 mg total) by mouth daily.        Allergies:  Allergies  Allergen Reactions   Tramadol Other (See Comments)    Other reaction(s): Hallucination Pt states that it made crazy.    Family History: Family History  Problem Relation Age of Onset   Heart disease Father    Diabetes Mother    Hypertension Mother    Heart attack Brother 17   Prostate cancer Neg Hx    Bladder Cancer Neg Hx    Kidney cancer Neg Hx     Social History:  reports that he has never smoked. He has never used smokeless tobacco. He reports that he does not drink alcohol and does not use drugs.  ROS: Pertinent ROS in HPI  Physical Exam: Constitutional:  Well nourished. Alert and oriented, No acute distress. HEENT: [place] AT, mask in place.  Trachea midline Cardiovascular: No clubbing, cyanosis, or edema. Respiratory: Normal respiratory effort, no increased work of breathing. GU: No CVA tenderness.  No bladder fullness or masses.  Patient with circumcised phallus.  Urethral meatus is patent.  No penile discharge. No penile  lesions or rashes. Scrotum without lesions, cysts, rashes and/or edema.   Neurologic: Grossly intact, no focal deficits, moving all 4 extremities. Psychiatric: Normal mood and affect.   Laboratory Data: N/A  Pertinent Imaging:  03/31/21 15:20  Scan Result 478m    Simple Catheter Placement Due to urinary retention patient is present today for a foley cath placement.  Patient was cleaned and prepped in a sterile fashion with betadine. A 16 FR Coude foley catheter was inserted, urine return was noted  500 ml, urine was yellow in color.  The balloon was filled with 10cc of sterile water.  A leg bag was attached for drainage. Patient was also given a night bag to take home and was given instruction on how to change from one bag to another.  Patient was given instruction on proper catheter care.  Patient tolerated well, no complications were noted   Assessment & Plan:    1.  Postoperative urinary retention -continue tamsulosin 0.4 mg daily -Foley in place -UDS scheduled for next Tuesday  2. Gross hematuria -resolved   Return for Pending UDS results .  These notes generated with voice recognition software. I apologize for typographical errors.  Zara Council, PA-C  [facility] [address]  [place], [place] [address] [phone]

## 2021-04-07 ENCOUNTER — Ambulatory Visit (INDEPENDENT_AMBULATORY_CARE_PROVIDER_SITE_OTHER): Payer: Medicare Other | Admitting: Urology

## 2021-04-07 ENCOUNTER — Other Ambulatory Visit: Payer: Self-pay

## 2021-04-07 ENCOUNTER — Encounter: Payer: Self-pay | Admitting: Urology

## 2021-04-07 VITALS — BP 131/78 | HR 92 | Ht 69.0 in | Wt 154.0 lb

## 2021-04-07 DIAGNOSIS — R338 Other retention of urine: Secondary | ICD-10-CM

## 2021-04-07 DIAGNOSIS — N9989 Other postprocedural complications and disorders of genitourinary system: Secondary | ICD-10-CM | POA: Diagnosis not present

## 2021-04-13 ENCOUNTER — Telehealth: Payer: Self-pay | Admitting: Urology

## 2021-04-13 NOTE — Telephone Encounter (Signed)
Patient called the office today to follow up from his recent office visit on 1/26 with Zara Council.  Patient states that Larene Beach was going to talk to one of the other doctors in the practice and was going to get back to him.   Patient is requesting information based on that conversation and a follow up call from [facility].

## 2021-04-20 ENCOUNTER — Ambulatory Visit (INDEPENDENT_AMBULATORY_CARE_PROVIDER_SITE_OTHER): Payer: Medicare Other | Admitting: Urology

## 2021-04-20 ENCOUNTER — Encounter: Payer: Self-pay | Admitting: Urology

## 2021-04-20 ENCOUNTER — Other Ambulatory Visit: Payer: Self-pay

## 2021-04-20 VITALS — BP 150/88 | HR 94 | Ht 69.0 in | Wt 154.0 lb

## 2021-04-20 DIAGNOSIS — R972 Elevated prostate specific antigen [PSA]: Secondary | ICD-10-CM

## 2021-04-20 DIAGNOSIS — N9989 Other postprocedural complications and disorders of genitourinary system: Secondary | ICD-10-CM

## 2021-04-20 DIAGNOSIS — R338 Other retention of urine: Secondary | ICD-10-CM

## 2021-04-20 LAB — URINALYSIS, COMPLETE
Bilirubin, UA: NEGATIVE
Glucose, UA: NEGATIVE
Ketones, UA: NEGATIVE
Nitrite, UA: POSITIVE — AB
Specific Gravity, UA: >1.03 — ABNORMAL HIGH (ref 1.005–1.030)
Urobilinogen, Ur: 0.2 mg/dL (ref 0.2–1.0)
pH, UA: 5.5 (ref 5.0–7.5)

## 2021-04-20 LAB — MICROSCOPIC EXAMINATION: WBC, UA: >30 /hpf — AB (ref 0–5)

## 2021-04-20 NOTE — Progress Notes (Signed)
04/20/2021 8:20 PM   Casey Acevedo 09/14/1940 [id number]  Referring provider: Idelle Crouch, MD [facility] [place],  [place] [address]  Chief Complaint  Patient presents with   Nephrolithiasis    HPI: 81 y.o. male presents for follow-up of urinary retention.  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 ([place]) 06/05/2017    Surgical History: Past Surgical History:  Procedure Laterality Date   CHOLECYSTECTOMY     COMPLETE ESOPHAGECTOMY N/A 06/18/2017   Procedure: cervical ESOPHAGECTOMY COMPLETE;  Surgeon: Grace Isaac, MD;  Location: [facility] OR;  Service: Thoracic;  Laterality: N/A;  NEED NIMS ET TUBE   ESOPHAGOGASTRODUODENOSCOPY (EGD) WITH PROPOFOL N/A 12/07/2016   Procedure: ESOPHAGOGASTRODUODENOSCOPY (EGD) WITH PROPOFOL;  Surgeon: Jonathon Bellows, MD;  Location: [facility] ENDOSCOPY;  Service: Gastroenterology;  Laterality: N/A;   ESOPHAGOGASTRODUODENOSCOPY (EGD) WITH PROPOFOL N/A 12/26/2016   Procedure: ESOPHAGOGASTRODUODENOSCOPY (EGD) WITH PROPOFOL WITH DILATION;  Surgeon: Jonathon Bellows, MD;  Location: [facility] ENDOSCOPY;  Service: Gastroenterology;  Laterality: N/A;   EYE SURGERY     GASTROJEJUNOSTOMY N/A 01/16/2017   Procedure: LAPROSCOPIC ASSIST FEEDING JEJUNOSTOMY TUBE;  Surgeon: Johnathan Hausen, MD;  Location: [facility] ORS;  Service: General;  Laterality: N/A;   IR FLUORO GUIDE PORT  INSERTION RIGHT  01/10/2017   IR REMOVAL TUN ACCESS W/ PORT W/O FL MOD SED  03/22/2018   IR REPLACE G-TUBE SIMPLE WO FLUORO  03/15/2017   IR [place] DUODEN/JEJUNO TUBE PERCUT W/FLUORO  03/05/2017   KYPHOPLASTY N/A 08/31/2020   Procedure: L1 KYPHOPLASTY;  Surgeon: Hessie Knows, MD;  Location: [facility] ORS;  Service: Orthopedics;  Laterality: N/A;   KYPHOPLASTY N/A 10/05/2020   Procedure: T12 KYPHOPLASTY;  Surgeon: Hessie Knows, MD;  Location: [facility] ORS;  Service: Orthopedics;  Laterality: N/A;   PICC LINE INSERTION Right    PYLOROMYOTOMY N/A 06/18/2017   Procedure: PYLOROMYOTOMY;  Surgeon: Grace Isaac, MD;  Location: [place];  Service: Thoracic;  Laterality: N/A;   TRANSURETHRAL RESECTION OF BLADDER TUMOR N/A 02/15/2021   Procedure: TRANSURETHRAL RESECTION OF BLADDER TUMOR (TURBT);  Surgeon: Abbie Sons, MD;  Location: [facility] ORS;  Service: Urology;  Laterality: N/A;   TRANSURETHRAL RESECTION OF BLADDER TUMOR WITH MITOMYCIN-C N/A 01/13/2020   Procedure: TRANSURETHRAL RESECTION OF BLADDER TUMOR WITH gemcitabine;  Surgeon: Abbie Sons, MD;  Location: [facility] ORS;  Service: Urology;  Laterality: N/A;   VIDEO BRONCHOSCOPY N/A 06/18/2017   Procedure: VIDEO BRONCHOSCOPY;  Surgeon: Grace Isaac, MD;  Location: [facility] OR;  Service: Thoracic;  Laterality: N/A;    Home Medications:  Allergies as of 04/20/2021       Reactions   Tramadol Other (See Comments)   Other reaction(s): Hallucination Pt states that it made crazy.        Medication List        Accurate as of April 20, 2021 11:59 PM. If you have any questions, ask your nurse or doctor.          STOP taking these medications  oxyCODONE-acetaminophen 5-325 MG tablet Commonly known as: PERCOCET/ROXICET Stopped by: Abbie Sons, MD   sulfamethoxazole-trimethoprim 800-160 MG tablet Commonly known as: BACTRIM DS Stopped by: Abbie Sons, MD       TAKE these medications    multivitamin with minerals tablet Take 1 tablet  by mouth daily.   tamsulosin 0.4 MG Caps capsule Commonly known as: FLOMAX Take 1 capsule (0.4 mg total) by mouth daily.        Allergies:  Allergies  Allergen Reactions   Tramadol Other (See Comments)    Other reaction(s): Hallucination Pt states that it made crazy.    Family History: Family History  Problem Relation Age of Onset   Heart disease Father    Diabetes Mother    Hypertension Mother    Heart attack Brother 65   Prostate cancer Neg Hx    Bladder Cancer Neg Hx    Kidney cancer Neg Hx     Social History:  reports that he has never smoked. He has never used smokeless tobacco. He reports that he does not drink alcohol and does not use drugs.   Physical Exam: BP (!) 150/88    Pulse 94    Ht _0  (1.753 m)    Wt 154 lb (69.9 kg)    BMI 22.74 kg/m   Constitutional:  Alert and oriented, No acute distress. HEENT: [place] AT, moist mucus membranes.  Trachea midline, no masses. Cardiovascular: No clubbing, cyanosis, or edema. Respiratory: Normal respiratory effort, no increased work of breathing. GI: Abdomen is soft, nontender, nondistended, no abdominal masses GU: Prostate flat and slightly firm with estimated volume rectally 50 g Skin: No rashes, bruises or suspicious lesions. Neurologic: Grossly intact, no focal deficits, moving all 4 extremities. Psychiatric: Normal mood and affect.   Assessment & Plan:    1.  Urinary retention Previous cystoscopy was mild to moderate lateral lobe enlargement and mild bladder neck elevation Urodynamically obstructed and desires to schedule an outlet procedure Would recommend cystoscopy under anesthesia with TURP versus TUIP The procedure was discussed in detail including potential risks of bleeding, urethral stricture, bladder neck contracture and rarely urinary incontinence.  The high incidence of retrograde ejaculation was discussed though he is not sexually active We discussed statistically there is an ~85% chance he will be  able to void after an outlet procedure Urine culture ordered   2.  Elevated PSA History of mild PSA elevation on checks by PCP.  PSA July 2022 was 15.5 and even though he has catheter and will repeat today.  If not at baseline would recommend transrectal prostate biopsies at the time of his outlet procedure    Abbie Sons, MD  [facility] [address], [place], [place] [address] [phone]

## 2021-04-20 NOTE — H&P (View-Only) (Signed)
04/20/2021 8:20 PM   Casey Acevedo 12/07/1940 [id number]  Referring provider: Idelle Crouch, MD [facility] [place],  [place] [address]  Chief Complaint  Patient presents with   Nephrolithiasis    HPI: 81 y.o. male presents for follow-up of urinary retention.  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 ([place]) 06/05/2017    Surgical History: Past Surgical History:  Procedure Laterality Date   CHOLECYSTECTOMY     COMPLETE ESOPHAGECTOMY N/A 06/18/2017   Procedure: cervical ESOPHAGECTOMY COMPLETE;  Surgeon: Grace Isaac, MD;  Location: [facility] OR;  Service: Thoracic;  Laterality: N/A;  NEED NIMS ET TUBE   ESOPHAGOGASTRODUODENOSCOPY (EGD) WITH PROPOFOL N/A 12/07/2016   Procedure: ESOPHAGOGASTRODUODENOSCOPY (EGD) WITH PROPOFOL;  Surgeon: Jonathon Bellows, MD;  Location: [facility] ENDOSCOPY;  Service: Gastroenterology;  Laterality: N/A;   ESOPHAGOGASTRODUODENOSCOPY (EGD) WITH PROPOFOL N/A 12/26/2016   Procedure: ESOPHAGOGASTRODUODENOSCOPY (EGD) WITH PROPOFOL WITH DILATION;  Surgeon: Jonathon Bellows, MD;  Location: [facility] ENDOSCOPY;  Service: Gastroenterology;  Laterality: N/A;   EYE SURGERY     GASTROJEJUNOSTOMY N/A 01/16/2017   Procedure: LAPROSCOPIC ASSIST FEEDING JEJUNOSTOMY TUBE;  Surgeon: Johnathan Hausen, MD;  Location: [facility] ORS;  Service: General;  Laterality: N/A;   IR FLUORO GUIDE PORT  INSERTION RIGHT  01/10/2017   IR REMOVAL TUN ACCESS W/ PORT W/O FL MOD SED  03/22/2018   IR REPLACE G-TUBE SIMPLE WO FLUORO  03/15/2017   IR [place] DUODEN/JEJUNO TUBE PERCUT W/FLUORO  03/05/2017   KYPHOPLASTY N/A 08/31/2020   Procedure: L1 KYPHOPLASTY;  Surgeon: Hessie Knows, MD;  Location: [facility] ORS;  Service: Orthopedics;  Laterality: N/A;   KYPHOPLASTY N/A 10/05/2020   Procedure: T12 KYPHOPLASTY;  Surgeon: Hessie Knows, MD;  Location: [facility] ORS;  Service: Orthopedics;  Laterality: N/A;   PICC LINE INSERTION Right    PYLOROMYOTOMY N/A 06/18/2017   Procedure: PYLOROMYOTOMY;  Surgeon: Grace Isaac, MD;  Location: [place];  Service: Thoracic;  Laterality: N/A;   TRANSURETHRAL RESECTION OF BLADDER TUMOR N/A 02/15/2021   Procedure: TRANSURETHRAL RESECTION OF BLADDER TUMOR (TURBT);  Surgeon: Abbie Sons, MD;  Location: [facility] ORS;  Service: Urology;  Laterality: N/A;   TRANSURETHRAL RESECTION OF BLADDER TUMOR WITH MITOMYCIN-C N/A 01/13/2020   Procedure: TRANSURETHRAL RESECTION OF BLADDER TUMOR WITH gemcitabine;  Surgeon: Abbie Sons, MD;  Location: [facility] ORS;  Service: Urology;  Laterality: N/A;   VIDEO BRONCHOSCOPY N/A 06/18/2017   Procedure: VIDEO BRONCHOSCOPY;  Surgeon: Grace Isaac, MD;  Location: [facility] OR;  Service: Thoracic;  Laterality: N/A;    Home Medications:  Allergies as of 04/20/2021       Reactions   Tramadol Other (See Comments)   Other reaction(s): Hallucination Pt states that it made crazy.        Medication List        Accurate as of April 20, 2021 11:59 PM. If you have any questions, ask your nurse or doctor.          STOP taking these medications  oxyCODONE-acetaminophen 5-325 MG tablet Commonly known as: PERCOCET/ROXICET Stopped by: Abbie Sons, MD   sulfamethoxazole-trimethoprim 800-160 MG tablet Commonly known as: BACTRIM DS Stopped by: Abbie Sons, MD       TAKE these medications    multivitamin with minerals tablet Take 1 tablet  by mouth daily.   tamsulosin 0.4 MG Caps capsule Commonly known as: FLOMAX Take 1 capsule (0.4 mg total) by mouth daily.        Allergies:  Allergies  Allergen Reactions   Tramadol Other (See Comments)    Other reaction(s): Hallucination Pt states that it made crazy.    Family History: Family History  Problem Relation Age of Onset   Heart disease Father    Diabetes Mother    Hypertension Mother    Heart attack Brother 8   Prostate cancer Neg Hx    Bladder Cancer Neg Hx    Kidney cancer Neg Hx     Social History:  reports that he has never smoked. He has never used smokeless tobacco. He reports that he does not drink alcohol and does not use drugs.   Physical Exam: BP (!) 150/88    Pulse 94    Ht _0  (1.753 m)    Wt 154 lb (69.9 kg)    BMI 22.74 kg/m   Constitutional:  Alert and oriented, No acute distress. HEENT: [place] AT, moist mucus membranes.  Trachea midline, no masses. Cardiovascular: No clubbing, cyanosis, or edema. Respiratory: Normal respiratory effort, no increased work of breathing. GI: Abdomen is soft, nontender, nondistended, no abdominal masses GU: Prostate flat and slightly firm with estimated volume rectally 50 g Skin: No rashes, bruises or suspicious lesions. Neurologic: Grossly intact, no focal deficits, moving all 4 extremities. Psychiatric: Normal mood and affect.   Assessment & Plan:    1.  Urinary retention Previous cystoscopy was mild to moderate lateral lobe enlargement and mild bladder neck elevation Urodynamically obstructed and desires to schedule an outlet procedure Would recommend cystoscopy under anesthesia with TURP versus TUIP The procedure was discussed in detail including potential risks of bleeding, urethral stricture, bladder neck contracture and rarely urinary incontinence.  The high incidence of retrograde ejaculation was discussed though he is not sexually active We discussed statistically there is an ~85% chance he will be  able to void after an outlet procedure Urine culture ordered   2.  Elevated PSA History of mild PSA elevation on checks by PCP.  PSA July 2022 was 15.5 and even though he has catheter and will repeat today.  If not at baseline would recommend transrectal prostate biopsies at the time of his outlet procedure    Abbie Sons, MD  [facility] [address], [place], [place] [address] [phone]

## 2021-04-21 LAB — PSA: Prostate Specific Ag, Serum: 14 ng/mL — ABNORMAL HIGH (ref 0.0–4.0)

## 2021-04-24 ENCOUNTER — Encounter: Payer: Self-pay | Admitting: Urology

## 2021-04-24 ENCOUNTER — Other Ambulatory Visit: Payer: Self-pay | Admitting: Urology

## 2021-04-24 DIAGNOSIS — R339 Retention of urine, unspecified: Secondary | ICD-10-CM

## 2021-04-24 DIAGNOSIS — R972 Elevated prostate specific antigen [PSA]: Secondary | ICD-10-CM

## 2021-04-24 NOTE — Progress Notes (Signed)
Surgical Physician Order Form [facility] [place]  * Scheduling expectation : ASAP  *Length of Case: 60 min  *Clearance needed: no  *Anticoagulation Instructions: N/A  *Aspirin Instructions: N/A  *Post-op visit Date/Instructions:   3-day voiding trial  *Diagnosis: Urinary retention; elevated PSA  *Procedure: Transurethral resection prostate, possible transurethral incision prostate; TRUS with prostate biopsies   Additional orders: N/A  -Admit type: Observation  -Anesthesia: Choice  -VTE Prophylaxis Standing Order SCDs       Other:   -Standing Lab Orders Per Anesthesia    Lab other: UA&Urine Culture-ordered  -Standing Test orders EKG/Chest x-ray per Anesthesia       Test other:   - Medications:  Ceftriaxone(Rocephin) 1gm IV  -Other orders:  Fleets enema AM

## 2021-04-25 LAB — CULTURE, URINE COMPREHENSIVE

## 2021-04-26 ENCOUNTER — Other Ambulatory Visit: Payer: Self-pay | Admitting: Urology

## 2021-04-26 DIAGNOSIS — R339 Retention of urine, unspecified: Secondary | ICD-10-CM

## 2021-04-26 DIAGNOSIS — R972 Elevated prostate specific antigen [PSA]: Secondary | ICD-10-CM

## 2021-04-27 ENCOUNTER — Telehealth: Payer: Self-pay

## 2021-04-27 MED ORDER — LEVOFLOXACIN 500 MG PO TABS: 500.0000 mg | ORAL_TABLET | Freq: Every day | ORAL | 0 refills | Status: DC

## 2021-04-27 MED ORDER — FLEET ENEMA 7-19 GM/118ML RE ENEM: 1.0000 | ENEMA | Freq: Once | RECTAL | 0 refills | Status: DC

## 2021-04-27 NOTE — Telephone Encounter (Signed)
I spoke with Casey Acevedo. We have discussed possible surgery dates and Tuesday February 21st, 2023 was agreed upon by all parties. Patient given information about surgery date, what to expect pre-operatively and post operatively.   We discussed that a Pre-Admission Testing office will be calling to set up the pre-op visit that will take place prior to surgery, and that these appointments are typically done over the phone with a Pre-Admissions RN.   Informed patient that our office will communicate any additional care to be provided after surgery. Patients questions or concerns were discussed during our call. Advised to call our office should there be any additional information, questions or concerns that arise. Patient verbalized understanding.

## 2021-04-27 NOTE — Progress Notes (Signed)
[facility] Posting Form   Surgery Date/Time: Date: 05/03/2021  Surgeon: Dr. John Giovanni, MD  Surgery Location: Day Surgery  Inpt ( No  )   Outpt (No)   Obs ( Yes  )   Diagnosis: Urinary Retention R33.9, Elevated PSA R97.20  -CPT: [id number] with poss [id number]  Surgery: Transurethral Resection of the prostate with possible transurethral incision prostate; Transrectal ultrasound with prostate biopsies  Stop Anticoagulations: N/A  Cardiac/Medical/Pulmonary Clearance needed: no  *Orders entered into EPIC  Date: 04/27/21   *Case booked in EPIC  Date: 04/27/21  *Notified pt of Surgery: Date: 04/26/2021  PRE-OP UA & CX: yes, obtained last week  *Placed into Prior Authorization Work Que Date: 04/27/21   Assistant/laser/rep:No

## 2021-04-29 ENCOUNTER — Encounter
Admission: RE | Admit: 2021-04-29 | Discharge: 2021-04-29 | Disposition: A | Discharge status: Home / Self Care | Payer: Medicare Other | Source: Ambulatory Visit | Attending: Urology | Admitting: Urology

## 2021-04-29 ENCOUNTER — Other Ambulatory Visit: Payer: Self-pay

## 2021-04-29 DIAGNOSIS — I700 Atherosclerosis of aorta: Secondary | ICD-10-CM

## 2021-04-29 DIAGNOSIS — I2510 Atherosclerotic heart disease of native coronary artery without angina pectoris: Secondary | ICD-10-CM

## 2021-04-29 DIAGNOSIS — K769 Liver disease, unspecified: Secondary | ICD-10-CM

## 2021-04-29 HISTORY — DX: Unspecified osteoarthritis, unspecified site: M19.90

## 2021-04-29 HISTORY — DX: Neoplasm of unspecified behavior of bladder: D49.4

## 2021-04-29 HISTORY — DX: Abnormal levels of other serum enzymes: R74.8

## 2021-04-29 NOTE — Addendum Note (Signed)
Addended by: Gerald Leitz A on: 04/29/2021 03:26 PM   Modules accepted: Orders

## 2021-04-29 NOTE — Patient Instructions (Addendum)
Your procedure is scheduled on:05-03-21 Tuesday Report to the Registration Desk on the 1st floor of the [facility].Then proceed to the 2nd floor Surgery Desk in the [facility] To find out your arrival time, please call [phone] between 1PM - 3PM on:05-02-21 Monday  REMEMBER: Instructions that are not followed completely may result in serious medical risk, up to and including death; or upon the discretion of your surgeon and anesthesiologist your surgery may need to be rescheduled.  Do not eat food OR drink any liquids after midnight the night before surgery.  No gum chewing, lozengers or hard candies.  TAKE THESE MEDICATIONS THE MORNING OF SURGERY WITH A SIP OF WATER: -tamsulosin (FLOMAX)   One week prior to surgery: Stop Anti-inflammatories (NSAIDS) such as Advil, Aleve, Ibuprofen, Motrin, Naproxen, Naprosyn and Aspirin based products such as Excedrin, [place]ys Powder, BC Powder.You may however, continue to take Tylenol if needed for pain up until the day of surgery.  Stop ANY OVER THE COUNTER supplements/vitamins NOW (04-29-21) until after surgery (Multiple Vitamins)  Fleets enema as directed-Do Fleet Enema at home the morning of surgery-Do Enema 1 hour prior to arrival time to the hospital  No Alcohol for 24 hours before or after surgery.  No Smoking including e-cigarettes for 24 hours prior to surgery.  No chewable tobacco products for at least 6 hours prior to surgery.  No nicotine patches on the day of surgery.  Do not use any "recreational" drugs for at least a week prior to your surgery.  Please be advised that the combination of cocaine and anesthesia may have negative outcomes, up to and including death. If you test positive for cocaine, your surgery will be cancelled.  On the morning of surgery brush your teeth with toothpaste and water, you may rinse your mouth with mouthwash if you wish. Do not swallow any toothpaste or mouthwash.  Do not wear jewelry, make-up,  hairpins, clips or nail polish.  Do not wear lotions, powders, or perfumes.   Do not shave body from the neck down 48 hours prior to surgery just in case you cut yourself which could leave a site for infection.  Also, freshly shaved skin may become irritated if using the CHG soap.  Contact lenses, hearing aids and dentures may not be worn into surgery.  Do not bring valuables to the hospital. [facility] is not responsible for any missing/lost belongings or valuables.   Notify your doctor if there is any change in your medical condition (cold, fever, infection).  Wear comfortable clothing (specific to your surgery type) to the hospital.  After surgery, you can help prevent lung complications by doing breathing exercises.  Take deep breaths and cough every 1-2 hours. Your doctor may order a device called an Incentive Spirometer to help you take deep breaths. When coughing or sneezing, hold a pillow firmly against your incision with both hands. This is called splinting. Doing this helps protect your incision. It also decreases belly discomfort.  If you are being admitted to the hospital overnight, leave your suitcase in the car. After surgery it may be brought to your room.  If you are being discharged the day of surgery, you will not be allowed to drive home. You will need a responsible adult (18 years or older) to drive you home and stay with you that night.   If you are taking public transportation, you will need to have a responsible adult (18 years or older) with you. Please confirm with your physician  that it is acceptable to use public transportation.   Please call the [facility] Dept. at [phone] if you have any questions about these instructions.  Surgery Visitation Policy:  Patients undergoing a surgery or procedure may have one family member or support person with them as long as that person is not COVID-19 positive or experiencing its symptoms.  That  person may remain in the waiting area during the procedure and may rotate out with other people.  Inpatient Visitation:    Visiting hours are 7 a.m. to 8 p.m. Up to two visitors ages 16+ are allowed at one time in a patient room. The visitors may rotate out with other people during the day. Visitors must check out when they leave, or other visitors will not be allowed. One designated support person may remain overnight. The visitor must pass COVID-19 screenings, use hand sanitizer when entering and exiting the patients room and wear a mask at all times, including in the patients room. Patients must also wear a mask when staff or their visitor are in the room. Masking is required regardless of vaccination status.   Sodium Phosphate Monobasic; Sodium Phosphate Dibasic enema What is this medication? SODIUM PHOSPATE SALT (SOE dee um  FOS fate  sawlt) is a saline laxative. It is used to treat constipation or to clean the bowel before a colonoscopy. This medicine may be used for other purposes; ask your health care provider or pharmacist if you have questions. COMMON BRAND NAME(S): Fleet, Ready To Use Saline  How should I use this medication? This medicine is for rectal use only. Do not take by mouth. Follow the directions on the prescription label. Wash your hands before and after use. Remove tip from enema bottle. Gently insert enema tip into the rectum. Squeeze bottle until almost all of the medicine is inside the rectum. Remove enema tip from the rectum and stay in position until the urge to evacuate is strong. Do not take doses that are larger than those recommended on the product label or otherwise directed by your healthcare professional. Do not take more than one dose in 24 hours. Talk to your pediatrician regarding the use of this medicine in children. While this drug may be prescribed for children as young as 65 years old for selected conditions, precautions do apply. Overdosage: If you think  you have taken too much of this medicine contact a poison control center or emergency room at once. NOTE: This medicine is only for you. Do not share this medicine with others.  What side effects may I notice from receiving this medication? Side effects that you should report to your doctor or health care professional as soon as possible: allergic reactions like skin rash, itching or hives, swelling of the face, lips, or tongue irregular heart beat rectal bleeding seizures Side effects that usually do not require medical attention (report to your doctor or health care professional if they continue or are bothersome): bloating dizziness headache nausea and vomiting stomach pain This list may not describe all possible side effects. Call your doctor for medical advice about side effects. You may report side effects to FDA at 1-800-FDA-1088. Where should I keep my medication? Keep out of the reach of children. Store at room temperature between 15 and 30 degrees C (59 and 86 degrees F). Throw away any unused medicine after the expiration date. NOTE: This sheet is a summary. It may not cover all possible information. If you have questions about this medicine, talk  to your doctor, pharmacist, or health care provider.  2022 Elsevier/Gold Standard (2012-03-21 00:00:00)

## 2021-05-02 ENCOUNTER — Other Ambulatory Visit: Payer: Self-pay

## 2021-05-02 ENCOUNTER — Encounter
Admission: RE | Admit: 2021-05-02 | Discharge: 2021-05-02 | Disposition: A | Discharge status: Home / Self Care | Payer: Medicare Other | Source: Ambulatory Visit | Attending: Urology | Admitting: Urology

## 2021-05-02 DIAGNOSIS — Z01818 Encounter for other preprocedural examination: Secondary | ICD-10-CM | POA: Diagnosis present

## 2021-05-02 DIAGNOSIS — I2510 Atherosclerotic heart disease of native coronary artery without angina pectoris: Secondary | ICD-10-CM | POA: Insufficient documentation

## 2021-05-02 DIAGNOSIS — I700 Atherosclerosis of aorta: Secondary | ICD-10-CM | POA: Insufficient documentation

## 2021-05-02 DIAGNOSIS — K769 Liver disease, unspecified: Secondary | ICD-10-CM | POA: Insufficient documentation

## 2021-05-02 DIAGNOSIS — I2584 Coronary atherosclerosis due to calcified coronary lesion: Secondary | ICD-10-CM | POA: Diagnosis not present

## 2021-05-02 DIAGNOSIS — Z20822 Contact with and (suspected) exposure to covid-19: Secondary | ICD-10-CM | POA: Insufficient documentation

## 2021-05-02 HISTORY — DX: Atherosclerosis of aorta: I70.0

## 2021-05-02 HISTORY — DX: Atherosclerotic heart disease of native coronary artery without angina pectoris: I25.10

## 2021-05-02 LAB — CBC
HCT: 42.2 % (ref 39.0–52.0)
Hemoglobin: 13.6 g/dL (ref 13.0–17.0)
MCH: 29.2 pg (ref 26.0–34.0)
MCHC: 32.2 g/dL (ref 30.0–36.0)
MCV: 90.6 fL (ref 80.0–100.0)
Platelets: 190 10*3/uL (ref 150–400)
RBC: 4.66 MIL/uL (ref 4.22–5.81)
RDW: 15 % (ref 11.5–15.5)
WBC: 5.6 10*3/uL (ref 4.0–10.5)
nRBC: 0 % (ref 0.0–0.2)

## 2021-05-02 LAB — COMPREHENSIVE METABOLIC PANEL
ALT: 12 U/L (ref 0–44)
AST: 11 U/L — ABNORMAL LOW (ref 15–41)
Albumin: 3.6 g/dL (ref 3.5–5.0)
Alkaline Phosphatase: 70 U/L (ref 38–126)
Anion gap: 8 (ref 5–15)
BUN: 13 mg/dL (ref 8–23)
CO2: 25 mmol/L (ref 22–32)
Calcium: 8.8 mg/dL — ABNORMAL LOW (ref 8.9–10.3)
Chloride: 107 mmol/L (ref 98–111)
Creatinine, Ser: 1.09 mg/dL (ref 0.61–1.24)
GFR, Estimated: >60 mL/min (ref >60)
Glucose, Bld: 128 mg/dL — ABNORMAL HIGH (ref 70–99)
Potassium: 3.5 mmol/L (ref 3.5–5.1)
Sodium: 140 mmol/L (ref 135–145)
Total Bilirubin: 0.8 mg/dL (ref 0.3–1.2)
Total Protein: 6.4 g/dL — ABNORMAL LOW (ref 6.5–8.1)

## 2021-05-03 ENCOUNTER — Ambulatory Visit: Payer: Medicare Other | Admitting: Urgent Care

## 2021-05-03 ENCOUNTER — Other Ambulatory Visit: Payer: Self-pay

## 2021-05-03 ENCOUNTER — Encounter
Admission: RE | Disposition: A | Discharge status: Home / Self Care | Payer: Self-pay | Source: Ambulatory Visit | Attending: Urology

## 2021-05-03 ENCOUNTER — Encounter: Payer: Self-pay | Admitting: Urology

## 2021-05-03 ENCOUNTER — Observation Stay
Admission: RE | Admit: 2021-05-03 | Discharge: 2021-05-04 | Disposition: A | Discharge status: Home / Self Care | Payer: Medicare Other | Source: Ambulatory Visit | Attending: Urology | Admitting: Urology

## 2021-05-03 ENCOUNTER — Ambulatory Visit
Admission: RE | Admit: 2021-05-03 | Discharge: 2021-05-03 | Disposition: A | Discharge status: Home / Self Care | Payer: Medicare Other | Source: Ambulatory Visit | Attending: Urology | Admitting: Urology

## 2021-05-03 DIAGNOSIS — R338 Other retention of urine: Secondary | ICD-10-CM | POA: Insufficient documentation

## 2021-05-03 DIAGNOSIS — N401 Enlarged prostate with lower urinary tract symptoms: Secondary | ICD-10-CM | POA: Diagnosis not present

## 2021-05-03 DIAGNOSIS — R972 Elevated prostate specific antigen [PSA]: Secondary | ICD-10-CM

## 2021-05-03 DIAGNOSIS — R339 Retention of urine, unspecified: Secondary | ICD-10-CM

## 2021-05-03 DIAGNOSIS — C61 Malignant neoplasm of prostate: Secondary | ICD-10-CM | POA: Diagnosis not present

## 2021-05-03 DIAGNOSIS — Z8501 Personal history of malignant neoplasm of esophagus: Secondary | ICD-10-CM | POA: Insufficient documentation

## 2021-05-03 HISTORY — PX: PROSTATE BIOPSY: SHX241

## 2021-05-03 HISTORY — PX: TRANSRECTAL ULTRASOUND: SHX5146

## 2021-05-03 HISTORY — PX: TRANSURETHRAL RESECTION OF PROSTATE: SHX73

## 2021-05-03 LAB — BASIC METABOLIC PANEL
Anion gap: 9 (ref 5–15)
BUN: 12 mg/dL (ref 8–23)
CO2: 24 mmol/L (ref 22–32)
Calcium: 8.5 mg/dL — ABNORMAL LOW (ref 8.9–10.3)
Chloride: 108 mmol/L (ref 98–111)
Creatinine, Ser: 1.16 mg/dL (ref 0.61–1.24)
GFR, Estimated: >60 mL/min (ref >60)
Glucose, Bld: 117 mg/dL — ABNORMAL HIGH (ref 70–99)
Potassium: 3.8 mmol/L (ref 3.5–5.1)
Sodium: 141 mmol/L (ref 135–145)

## 2021-05-03 LAB — CBC
HCT: 42.8 % (ref 39.0–52.0)
Hemoglobin: 14.2 g/dL (ref 13.0–17.0)
MCH: 29.8 pg (ref 26.0–34.0)
MCHC: 33.2 g/dL (ref 30.0–36.0)
MCV: 89.7 fL (ref 80.0–100.0)
Platelets: 185 10*3/uL (ref 150–400)
RBC: 4.77 MIL/uL (ref 4.22–5.81)
RDW: 14.9 % (ref 11.5–15.5)
WBC: 5.4 10*3/uL (ref 4.0–10.5)
nRBC: 0 % (ref 0.0–0.2)

## 2021-05-03 LAB — SARS CORONAVIRUS 2 (TAT 6-24 HRS): SARS Coronavirus 2: NEGATIVE

## 2021-05-03 LAB — CREATININE, SERUM
Creatinine, Ser: 1.22 mg/dL (ref 0.61–1.24)
GFR, Estimated: 60 mL/min — ABNORMAL LOW (ref >60)

## 2021-05-03 SURGERY — BIOPSY, PROSTATE
Anesthesia: General

## 2021-05-03 MED ORDER — CHLORHEXIDINE GLUCONATE 0.12 % MT SOLN
OROMUCOSAL | Status: AC
  Administered 2021-05-03: 15 mL via OROMUCOSAL
  Filled 2021-05-03: qty 15

## 2021-05-03 MED ORDER — SODIUM CHLORIDE 0.9 % IR SOLN
3000.0000 mL | Status: DC
  Administered 2021-05-03 – 2021-05-04 (×6): 3000 mL

## 2021-05-03 MED ORDER — SENNOSIDES-DOCUSATE SODIUM 8.6-50 MG PO TABS
2.0000 | ORAL_TABLET | Freq: Every day | ORAL | Status: DC
  Administered 2021-05-03: 2 via ORAL
  Filled 2021-05-03: qty 2

## 2021-05-03 MED ORDER — LIDOCAINE HCL (CARDIAC) PF 100 MG/5ML IV SOSY
PREFILLED_SYRINGE | INTRAVENOUS | Status: DC | PRN
Start: 2021-05-03 — End: 2021-05-03
  Administered 2021-05-03: 60 mg via INTRAVENOUS

## 2021-05-03 MED ORDER — STERILE WATER FOR IRRIGATION IR SOLN
Status: DC | PRN
  Administered 2021-05-03: 500 mL

## 2021-05-03 MED ORDER — PHENYLEPHRINE HCL-NACL 20-0.9 MG/250ML-% IV SOLN
INTRAVENOUS | Status: AC
  Filled 2021-05-03: qty 250

## 2021-05-03 MED ORDER — BACITRACIN-NEOMYCIN-POLYMYXIN 400-5-5000 EX OINT
1.0000 "application " | TOPICAL_OINTMENT | Freq: Three times a day (TID) | CUTANEOUS | Status: DC | PRN
  Filled 2021-05-03: qty 1

## 2021-05-03 MED ORDER — PROPOFOL 10 MG/ML IV BOLUS
INTRAVENOUS | Status: DC | PRN
  Administered 2021-05-03: 120 mg via INTRAVENOUS
  Administered 2021-05-03: 100 mg via INTRAVENOUS

## 2021-05-03 MED ORDER — ENOXAPARIN SODIUM 40 MG/0.4ML IJ SOSY
40.0000 mg | PREFILLED_SYRINGE | INTRAMUSCULAR | Status: DC
  Administered 2021-05-04: 40 mg via SUBCUTANEOUS
  Filled 2021-05-03: qty 0.4

## 2021-05-03 MED ORDER — LIDOCAINE HCL (PF) 2 % IJ SOLN
INTRAMUSCULAR | Status: AC
  Filled 2021-05-03: qty 5

## 2021-05-03 MED ORDER — PHENYLEPHRINE 40 MCG/ML (10ML) SYRINGE FOR IV PUSH (FOR BLOOD PRESSURE SUPPORT)
PREFILLED_SYRINGE | INTRAVENOUS | Status: DC | PRN
Start: 2021-05-03 — End: 2021-05-03
  Administered 2021-05-03: 160 ug via INTRAVENOUS
  Administered 2021-05-03: 80 ug via INTRAVENOUS
  Administered 2021-05-03 (×4): 160 ug via INTRAVENOUS

## 2021-05-03 MED ORDER — FENTANYL CITRATE (PF) 100 MCG/2ML IJ SOLN
INTRAMUSCULAR | Status: DC | PRN
  Administered 2021-05-03 (×5): 25 ug via INTRAVENOUS
  Administered 2021-05-03: 50 ug via INTRAVENOUS
  Administered 2021-05-03: 25 ug via INTRAVENOUS

## 2021-05-03 MED ORDER — FAMOTIDINE 20 MG PO TABS: 20.0000 mg | ORAL_TABLET | Freq: Once | ORAL | Status: AC

## 2021-05-03 MED ORDER — PHENYLEPHRINE HCL-NACL 20-0.9 MG/250ML-% IV SOLN
INTRAVENOUS | Status: DC | PRN
Start: 2021-05-03 — End: 2021-05-03
  Administered 2021-05-03: 45 ug/min via INTRAVENOUS

## 2021-05-03 MED ORDER — FENTANYL CITRATE (PF) 100 MCG/2ML IJ SOLN
INTRAMUSCULAR | Status: AC
  Filled 2021-05-03: qty 2

## 2021-05-03 MED ORDER — FENTANYL CITRATE (PF) 100 MCG/2ML IJ SOLN
25.0000 ug | INTRAMUSCULAR | Status: DC | PRN
  Administered 2021-05-03: 25 ug via INTRAVENOUS

## 2021-05-03 MED ORDER — OXYCODONE HCL 5 MG PO TABS
5.0000 mg | ORAL_TABLET | ORAL | Status: DC | PRN
  Administered 2021-05-03: 5 mg via ORAL

## 2021-05-03 MED ORDER — PROMETHAZINE HCL 25 MG/ML IJ SOLN: 6.2500 mg | INTRAMUSCULAR | Status: DC | PRN

## 2021-05-03 MED ORDER — ORAL CARE MOUTH RINSE: 15.0000 mL | Freq: Once | OROMUCOSAL | Status: AC

## 2021-05-03 MED ORDER — MORPHINE SULFATE (PF) 2 MG/ML IV SOLN: 2.0000 mg | INTRAVENOUS | Status: DC | PRN

## 2021-05-03 MED ORDER — CHLORHEXIDINE GLUCONATE 0.12 % MT SOLN: 15.0000 mL | Freq: Once | OROMUCOSAL | Status: AC

## 2021-05-03 MED ORDER — ONDANSETRON HCL 4 MG/2ML IJ SOLN
4.0000 mg | INTRAMUSCULAR | Status: DC | PRN
  Administered 2021-05-03 (×2): 4 mg via INTRAVENOUS
  Filled 2021-05-03 (×2): qty 2

## 2021-05-03 MED ORDER — ONDANSETRON HCL 4 MG/2ML IJ SOLN
INTRAMUSCULAR | Status: AC
  Filled 2021-05-03: qty 2

## 2021-05-03 MED ORDER — ACETAMINOPHEN 10 MG/ML IV SOLN
INTRAVENOUS | Status: DC | PRN
  Administered 2021-05-03: 1000 mg via INTRAVENOUS

## 2021-05-03 MED ORDER — EPHEDRINE SULFATE (PRESSORS) 50 MG/ML IJ SOLN
INTRAMUSCULAR | Status: DC | PRN
  Administered 2021-05-03: 10 mg via INTRAVENOUS
  Administered 2021-05-03: 15 mg via INTRAVENOUS

## 2021-05-03 MED ORDER — DEXAMETHASONE SODIUM PHOSPHATE 10 MG/ML IJ SOLN
INTRAMUSCULAR | Status: AC
Start: 2021-05-03 — End: ?
  Filled 2021-05-03: qty 1

## 2021-05-03 MED ORDER — ONDANSETRON HCL 4 MG/2ML IJ SOLN
INTRAMUSCULAR | Status: DC | PRN
Start: 2021-05-03 — End: 2021-05-03
  Administered 2021-05-03: 4 mg via INTRAVENOUS

## 2021-05-03 MED ORDER — OXYCODONE HCL 5 MG PO TABS
ORAL_TABLET | ORAL | Status: AC
  Filled 2021-05-03: qty 1

## 2021-05-03 MED ORDER — ACETAMINOPHEN 10 MG/ML IV SOLN
INTRAVENOUS | Status: AC
  Filled 2021-05-03: qty 100

## 2021-05-03 MED ORDER — SODIUM CHLORIDE 0.9 % IR SOLN
Status: DC | PRN
  Administered 2021-05-03 (×4): 3000 mL via INTRAVESICAL

## 2021-05-03 MED ORDER — LACTATED RINGERS IV SOLN: INTRAVENOUS | Status: DC

## 2021-05-03 MED ORDER — ACETAMINOPHEN 325 MG PO TABS: 650.0000 mg | ORAL_TABLET | ORAL | Status: DC | PRN

## 2021-05-03 MED ORDER — MAGNESIUM SULFATE IN D5W 1-5 GM/100ML-% IV SOLN
1.0000 g | Freq: Once | INTRAVENOUS | Status: AC
  Administered 2021-05-03: 1 g via INTRAVENOUS
  Filled 2021-05-03 (×2): qty 100

## 2021-05-03 MED ORDER — GENTAMICIN SULFATE 40 MG/ML IJ SOLN
2.5000 mg/kg | Freq: Once | INTRAVENOUS | Status: AC
  Administered 2021-05-03: 174.8 mg via INTRAVENOUS
  Filled 2021-05-03: qty 4.25

## 2021-05-03 MED ORDER — FAMOTIDINE 20 MG PO TABS
ORAL_TABLET | ORAL | Status: AC
  Administered 2021-05-03: 20 mg via ORAL
  Filled 2021-05-03: qty 1

## 2021-05-03 MED ORDER — DEXAMETHASONE SODIUM PHOSPHATE 10 MG/ML IJ SOLN
INTRAMUSCULAR | Status: DC | PRN
  Administered 2021-05-03: 10 mg via INTRAVENOUS

## 2021-05-03 MED ORDER — GLYCOPYRROLATE 0.2 MG/ML IJ SOLN
INTRAMUSCULAR | Status: DC | PRN
  Administered 2021-05-03: .2 mg via INTRAVENOUS

## 2021-05-03 MED ORDER — PROPOFOL 10 MG/ML IV BOLUS
INTRAVENOUS | Status: AC
  Filled 2021-05-03: qty 40

## 2021-05-03 MED ORDER — ESMOLOL HCL 100 MG/10ML IV SOLN
INTRAVENOUS | Status: DC | PRN
  Administered 2021-05-03 (×2): 10 mg via INTRAVENOUS
  Administered 2021-05-03: 20 mg via INTRAVENOUS

## 2021-05-03 MED ORDER — SODIUM CHLORIDE 0.9 % IV SOLN
INTRAVENOUS | Status: DC
Start: 2021-05-03 — End: 2021-05-04

## 2021-05-03 SURGICAL SUPPLY — 36 items
ADAPTER IRRIG TUBE 2 SPIKE SOL (ADAPTER) ×4 IMPLANT
ADPR TBG 2 SPK PMP STRL ASCP (ADAPTER) ×2
BAG DRAIN CYSTO-URO LG1000N (MISCELLANEOUS) ×2 IMPLANT
BAG DRN LRG CPC RND TRDRP CNTR (MISCELLANEOUS) ×1
BAG URO DRAIN 4000ML (MISCELLANEOUS) ×2 IMPLANT
CATH FOL 2WAY LX 24X30 (CATHETERS) IMPLANT
CATH FOL 3WAY LX 20X30 (CATHETERS) ×1 IMPLANT
COVER MAYO STAND REUSABLE (DRAPES) ×1 IMPLANT
DRAPE UTILITY 15X26 TOWEL STRL (DRAPES) ×2 IMPLANT
DRSG PAD ABDOMINAL 8X10 ST (GAUZE/BANDAGES/DRESSINGS) ×1 IMPLANT
ELECT BIVAP BIPO 22/24 DONUT (ELECTROSURGICAL) ×2
ELECT LOOP 22F BIPOLAR SML (ELECTROSURGICAL)
ELECTRD BIVAP BIPO 22/24 DONUT (ELECTROSURGICAL) IMPLANT
ELECTRODE LOOP 22F BIPOLAR SML (ELECTROSURGICAL) IMPLANT
GAUZE 4X4 16PLY ~~LOC~~+RFID DBL (SPONGE) ×2 IMPLANT
GAUZE SPONGE 4X4 12PLY STRL (GAUZE/BANDAGES/DRESSINGS) ×2 IMPLANT
GLOVE SURG UNDER POLY LF SZ7.5 (GLOVE) ×2 IMPLANT
GOWN STRL REUS W/ TWL LRG LVL3 (GOWN DISPOSABLE) ×2 IMPLANT
GOWN STRL REUS W/ TWL XL LVL3 (GOWN DISPOSABLE) ×1 IMPLANT
GOWN STRL REUS W/TWL LRG LVL3 (GOWN DISPOSABLE) ×4
GOWN STRL REUS W/TWL XL LVL3 (GOWN DISPOSABLE) ×2
HOLDER FOLEY CATH W/STRAP (MISCELLANEOUS) ×2 IMPLANT
INST BIOPSY MAXCORE 18GX25 (NEEDLE) ×2 IMPLANT
IV NS IRRIG 3000ML ARTHROMATIC (IV SOLUTION) ×10 IMPLANT
KIT TURNOVER CYSTO (KITS) ×2 IMPLANT
LOOP CUT BIPOLAR 24F LRG (ELECTROSURGICAL) ×1 IMPLANT
MANIFOLD NEPTUNE II (INSTRUMENTS) ×1 IMPLANT
PACK CYSTO AR (MISCELLANEOUS) ×2 IMPLANT
SET IRRIG Y TYPE TUR BLADDER L (SET/KITS/TRAYS/PACK) ×2 IMPLANT
SET IRRIGATING DISP (SET/KITS/TRAYS/PACK) ×1 IMPLANT
SURGILUBE 2OZ TUBE FLIPTOP (MISCELLANEOUS) ×2 IMPLANT
SYR TOOMEY 50ML (SYRINGE) ×2 IMPLANT
SYR TOOMEY IRRIG 70ML (MISCELLANEOUS)
SYRINGE TOOMEY IRRIG 70ML (MISCELLANEOUS) ×1 IMPLANT
WATER STERILE IRR 1000ML POUR (IV SOLUTION) ×1 IMPLANT
WATER STERILE IRR 500ML POUR (IV SOLUTION) ×2 IMPLANT

## 2021-05-03 NOTE — Transfer of Care (Signed)
Immediate Anesthesia Transfer of Care Note  Patient: Casey Acevedo  Procedure(s) Performed: PROSTATE BIOPSY TRANSRECTAL ULTRASOUND TRANSURETHRAL RESECTION OF THE PROSTATE (TURP)  Patient Location: PACU  Anesthesia Type:General  Level of Consciousness: awake, alert  and oriented  Airway & Oxygen Therapy: Patient Spontanous Breathing and Patient connected to face mask oxygen  Post-op Assessment: Report given to RN and Post -op Vital signs reviewed and stable  Post vital signs: Reviewed and stable  Last Vitals:  Vitals Value Taken Time  BP 105/54 05/03/21 0912  Temp    Pulse 52 05/03/21 0914  Resp 16 05/03/21 0914  SpO2 100 % 05/03/21 0914  Vitals shown include unvalidated device data.  Last Pain:  Vitals:   05/03/21 0642  TempSrc: Temporal  PainSc: 0-No pain         Complications: No notable events documented.

## 2021-05-03 NOTE — Anesthesia Procedure Notes (Signed)
Procedure Name: LMA Insertion Date/Time: 05/03/2021 7:46 AM Performed by: Loletha Grayer, CRNA Pre-anesthesia Checklist: Patient identified, Patient being monitored, Timeout performed, Emergency Drugs available and Suction available Patient Re-evaluated:Patient Re-evaluated prior to induction Oxygen Delivery Method: Circle system utilized Preoxygenation: Pre-oxygenation with 100% oxygen Induction Type: IV induction Ventilation: Mask ventilation without difficulty LMA: LMA inserted LMA Size: 4.0 Number of attempts: 1 Placement Confirmation: positive ETCO2 and breath sounds checked- equal and bilateral Tube secured with: Tape Dental Injury: Teeth and Oropharynx as per pre-operative assessment

## 2021-05-03 NOTE — Interval H&P Note (Signed)
History and Physical Interval Note: Patient with urinary retention for TURP versus TUIP.  He does have an elevated PSA will also undergo transrectal prostate biopsies.  All questions were answered and he desires to proceed.  CV:RRR Lungs: Clear  05/03/2021 7:20 AM  Casey Acevedo  has presented today for surgery, with the diagnosis of Elevated Prostate Specific Antigen, Urinary Retention.  The various methods of treatment have been discussed with the patient and family. After consideration of risks, benefits and other options for treatment, the patient has consented to  Procedure(s): PROSTATE BIOPSY (N/A) TRANSRECTAL ULTRASOUND (N/A) TRANSURETHRAL RESECTION OF THE PROSTATE (TURP) (N/A) POSSIBLE TRANSURETHRAL INCISION OF THE PROSTATE (TUIP) (N/A) as a surgical intervention.  The patient's history has been reviewed, patient examined, no change in status, stable for surgery.  I have reviewed the patient's chart and labs.  Questions were answered to the patient's satisfaction.     [name]

## 2021-05-03 NOTE — Op Note (Signed)
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 Ilias Stcharles M.D.  Anesthesia: General  Complications: None  EBL: Minimal  Specimens: Needle biopsies prostate Prostate chips   Indication: Casey Acevedo is a 81 y.o. male with a history of low-grade urothelial carcinoma the bladder who underwent TURBT 02/15/2021 and developed postoperative urinary retention.  Intraoperative findings at the time of cystoscopy remarkable only for mild to moderate lateral lobe enlargement.  He has failed several voiding trials on alpha blockade and urodynamic study was consistent with outlet obstruction.  His PCP checked a PSA July 2022 which was elevated at 15.  PSA was repeated however he had a catheter present at the time and was 14.  After reviewing the management options for treatment, he elected to proceed with the above surgical procedure(s). We have discussed the potential benefits and risks of the procedure, side effects of the proposed treatment, the likelihood of the patient achieving the goals of the procedure, and any potential problems that might occur during the procedure or recuperation. Informed consent has been obtained.  Description of procedure:  The patient was taken to the operating room and general anesthesia was induced.  The patient was placed in the dorsal lithotomy position, prepped and draped in the usual sterile fashion, and preoperative antibiotics were administered. A preoperative time-out was performed.   Digital rectal exam was performed and the prostate was flat, smooth without firmness.  A transrectal ultrasound probe was lubricated and passed per rectum.  TRUS was performed and no hypoechoic abnormalities were noted.  Prostate volume calculated at 50 g.  Standard 12 core  template biopsies were then performed under ultrasound guidance.  No significant bleeding was noted.  The ultrasound probe was removed and gloves were changed.  A 24 French continuous-flow resectoscope sheath was lubricated and passed into the bladder without difficulty.  An Iglesias resectoscope with loop was then placed into the sheath.  Cystoscopy was performed and no recurrent bladder tumor was noted.  UOs were well away from the bladder neck.  There was mild to moderate bladder neck elevation and moderate lateral lobe enlargement.  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  The prostate adenoma from the bladder neck back to the verumontanum was resected beginning at the six o'clock position and then extended to include the right and left lobes of the prostate and anterior prostate. Care was taken not to resect distal to the verumontanum.  Hemostasis was then achieved with the cautery.  The cutting loop was exchanged for a button electrode.  Additional tissue at the verumontanum was vaporized.  Hemostasis was again obtained with cautery.  All chips were removed from the bladder via irrigation.  Repeat cystoscopy was performed and no chips were identified within the bladder.  Hemostasis was noted to be adequate at the site of resection.  The resectoscope was removed.  A 20 F-3 way catheter was then placed into the bladder and placed on continuous bladder irrigation with return of clear effluent on low flow.  The patient appeared to tolerate the procedure well and without complications.  After anesthetic reversal he was transported to the PACU in satisfactory condition.    Plan: Observation with CBI   John Giovanni, MD

## 2021-05-03 NOTE — Anesthesia Preprocedure Evaluation (Signed)
Anesthesia Evaluation  Patient identified by MRN, date of birth, ID band Patient awake    Reviewed: Allergy & Precautions, H&P , NPO status , Patient's Chart, lab work & pertinent test results, reviewed documented beta blocker date and time   History of Anesthesia Complications Negative for: history of anesthetic complications  Airway Mallampati: II  TM Distance: >3 FB Neck ROM: full    Dental  (+) Dental Advidsory Given, Poor Dentition   Pulmonary neg pulmonary ROS,    Pulmonary exam normal        Cardiovascular Exercise Tolerance: Good (-) hypertension(-) angina+ CAD  (-) Past MI Normal cardiovascular exam(-) dysrhythmias (-) Valvular Problems/Murmurs Rate:Normal     Neuro/Psych negative neurological ROS  negative psych ROS   GI/Hepatic negative GI ROS, Neg liver ROS,   Endo/Other  negative endocrine ROS  Renal/GU negative Renal ROS  negative genitourinary   Musculoskeletal   Abdominal   Peds  Hematology negative hematology ROS (+)   Anesthesia Other Findings Past Medical History: No date: Aortic atherosclerosis (HCC) No date: Arthritis No date: Bladder tumor 01/28/2018: Compression fracture of thoracic spine, non-traumatic  (HCC) No date: Coronary artery calcification No date: Dysphagia No date: Elevated liver enzymes 2018: 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)   Reproductive/Obstetrics negative OB ROS                             Anesthesia Physical  Anesthesia Plan  ASA: 3  Anesthesia Plan: General   Post-op Pain Management:    Induction: Intravenous  PONV Risk Score and Plan: 3 and Ondansetron, Dexamethasone and Treatment may vary due to age or medical condition  Airway Management Planned: LMA  Additional Equipment:   Intra-op Plan:    Post-operative Plan: Extubation in OR  Informed Consent: I have reviewed the patients History and Physical, chart, labs and discussed the procedure including the risks, benefits and alternatives for the proposed anesthesia with the patient or authorized representative who has indicated his/her understanding and acceptance.       Plan Discussed with: CRNA  Anesthesia Plan Comments:         Anesthesia Quick Evaluation

## 2021-05-04 DIAGNOSIS — C61 Malignant neoplasm of prostate: Secondary | ICD-10-CM | POA: Diagnosis not present

## 2021-05-04 LAB — BASIC METABOLIC PANEL
Anion gap: 8 (ref 5–15)
BUN: 15 mg/dL (ref 8–23)
CO2: 22 mmol/L (ref 22–32)
Calcium: 8.4 mg/dL — ABNORMAL LOW (ref 8.9–10.3)
Chloride: 107 mmol/L (ref 98–111)
Creatinine, Ser: 1.05 mg/dL (ref 0.61–1.24)
GFR, Estimated: >60 mL/min (ref >60)
Glucose, Bld: 160 mg/dL — ABNORMAL HIGH (ref 70–99)
Potassium: 4.7 mmol/L (ref 3.5–5.1)
Sodium: 137 mmol/L (ref 135–145)

## 2021-05-04 LAB — SURGICAL PATHOLOGY

## 2021-05-04 MED ORDER — SENNOSIDES-DOCUSATE SODIUM 8.6-50 MG PO TABS: 2.0000 | ORAL_TABLET | Freq: Every day | ORAL | 0 refills | Status: DC

## 2021-05-04 MED ORDER — CHLORHEXIDINE GLUCONATE CLOTH 2 % EX PADS
6.0000 | MEDICATED_PAD | Freq: Every day | CUTANEOUS | Status: DC
  Administered 2021-05-04: 6 via TOPICAL

## 2021-05-04 MED ORDER — OXYCODONE HCL 5 MG PO TABS: 5.0000 mg | ORAL_TABLET | ORAL | 0 refills | Status: DC | PRN

## 2021-05-04 NOTE — Progress Notes (Signed)
Urology Consult Follow Up  Subjective: POD # 1 TURP and prostate biopsy  He is feeling well and wanting to go home.  He states he had a restful night with minimal pain.    VSS afebrile   Serum creatinine 1.05.  CBI on moderate rate, urine light pink.    Anti-infectives: Anti-infectives (From admission, onward)    Start     Dose/Rate Route Frequency Ordered Stop   05/03/21 0200  gentamicin (GARAMYCIN) 170 mg in dextrose 5 % 50 mL IVPB        2.5 mg/kg  69.9 kg 108.5 mL/hr over 30 Minutes Intravenous  Once 05/03/21 0150 05/03/21 0802       Current Facility-Administered Medications  Medication Dose Route Frequency Provider Last Rate Last Admin   0.9 %  sodium chloride infusion   Intravenous Continuous Stoioff, Ronda Fairly, MD 50 mL/hr at 05/04/21 0615 New Bag at 05/04/21 0615   acetaminophen (TYLENOL) tablet 650 mg  650 mg Oral Q4H PRN Stoioff, Ronda Fairly, MD       Chlorhexidine Gluconate Cloth 2 % PADS 6 each  6 each Topical Daily Stoioff, Scott C, MD       enoxaparin (LOVENOX) injection 40 mg  40 mg Subcutaneous Q24H Stoioff, Scott C, MD       morphine (PF) 2 MG/ML injection 2 mg  2 mg Intravenous Q4H PRN Stoioff, Scott C, MD       neomycin-bacitracin-polymyxin (NEOSPORIN) ointment packet 1 application  1 application Topical TID PRN Stoioff, Ronda Fairly, MD       ondansetron (ZOFRAN) injection 4 mg  4 mg Intravenous Q4H PRN Stoioff, Scott C, MD   4 mg at 05/03/21 2015   oxyCODONE (Oxy IR/ROXICODONE) immediate release tablet 5 mg  5 mg Oral Q4H PRN Stoioff, Scott C, MD   5 mg at 05/03/21 1106   senna-docusate (Senokot-S) tablet 2 tablet  2 tablet Oral QHS Stoioff, Scott C, MD   2 tablet at 05/03/21 2212   sodium chloride irrigation 0.9 % 3,000 mL  3,000 mL Irrigation Continuous Stoioff, Scott C, MD   3,000 mL at 05/04/21 0616     Objective: Vital signs in last 24 hours: Temp:  [97.3 F (36.3 C)-98 F (36.7 C)] 97.9 F (36.6 C) (02/22 0408) Pulse Rate:  [53-98] 77 (02/22 0408) Resp:   [9-20] 18 (02/22 0408) BP: (100-129)/(54-78) 123/70 (02/22 0408) SpO2:  [94 %-100 %] 94 % (02/22 0408)  Intake/Output from previous day: 02/21 0701 - 02/22 0700 In: 11255.8 [I.V.:1500; IV Piggyback:155.8] Out: 20975 [Urine:20975] Intake/Output this shift: No intake/output data recorded.   Physical Exam Constitutional:  Well nourished. Alert and oriented, No acute distress. HEENT: [place] AT, moist mucus membranes.  Trachea midline Cardiovascular: No clubbing, cyanosis, or edema. Respiratory: Normal respiratory effort, no increased work of breathing. GU: No CVA tenderness.  No bladder fullness or masses.  Foley in place draining clear light pink urine. Neurologic: Grossly intact, no focal deficits, moving all 4 extremities. Psychiatric: Normal mood and affect.   Lab Results:  Recent Labs    05/02/21 1037 05/03/21 1343  WBC 5.6 5.4  HGB 13.6 14.2  HCT 42.2 42.8  PLT 190 185   BMET Recent Labs    05/03/21 1027 05/03/21 1343 05/04/21 0356  NA 141  --  137  K 3.8  --  4.7  CL 108  --  107  CO2 24  --  22  GLUCOSE 117*  --  160*  BUN 12  --  15  CREATININE 1.16 1.22 1.05  CALCIUM 8.5*  --  8.4*   PT/INR No results for input(s): LABPROT, INR in the last 72 hours. ABG No results for input(s): PHART, HCO3 in the last 72 hours.  Invalid input(s): PCO2, PO2  Studies/Results: [place] Transrectal Complete  Result Date: 05/03/2021 Please see Notes tab for imaging impression.  [place] Intraoperative  Result Date: 05/03/2021 CLINICAL DATA:  Ultrasound was provided for use by the ordering physician.  No provider Interpretation or professional fees incurred.    [place] PROSTATE BIOPSY MULTIPLE  Result Date: 05/03/2021 Please see Notes tab for imaging impression.    Assessment: 81 year old male who is status post TURP/TRUSBx of prostate yesterday with Dr. Bernardo Heater currently on CBI overnight.  Plan: -turned off CBI and will check this afternoon -ambulate x 3 this morning -feed regular  diet  -plan for discharge today if tolerating diet, tolerates ambulation and no severe hematuria      LOS: 0 days    [facility] 05/04/2021

## 2021-05-04 NOTE — Progress Notes (Signed)
D/C instructions reviewed with patient, verbalized understanding. CBI disconnected and leg bag connected. Patient verbalized understanding on care of foley and bag. IV removed. To be taken to private vehicle via wheelchair.

## 2021-05-04 NOTE — Discharge Summary (Signed)
Date of admission: 05/03/2021  Date of discharge: 05/04/2021  Admission diagnosis: BPH with urinary retention and elevated PSA  Discharge diagnosis: BPH with urinary retention and elevated PSA   Secondary diagnoses:  Patient Active Problem List   Diagnosis Date Noted   Urinary retention due to benign prostatic hyperplasia 05/03/2021   Atherosclerosis of aorta ([place]) 12/02/2020   History of esophageal cancer    Coronary artery calcification 05/18/2017    History and Physical: For full details, please see admission history and physical. Briefly, Casey Acevedo is a 81 y.o. year old patient with BPH with urinary retention and a PSA of 14 who underwent TURP and TRUSBx of prostate on 05/03/2021.    Hospital Course: Patient tolerated the procedure well.  He was then transferred to the floor after an uneventful PACU stay.  His hospital course was uncomplicated.  On POD#1 he had met discharge criteria: was eating a regular diet, was up and ambulating independently,  pain was well controlled and was ready to for discharge.  Physical Exam: Constitutional:  Well nourished. Alert and oriented, No acute distress. HEENT: [place] AT, moist mucus membranes.  Trachea midline Cardiovascular: No clubbing, cyanosis, or edema. Respiratory: Normal respiratory effort, no increased work of breathing. GU: No CVA tenderness.  No bladder fullness or masses.  Patient with circumcised phallus. 3-way Foley in place draining dark yellow to light pink urine, no clots.  Scrotum without lesions, cysts, rashes and/or edema.   Neurologic: Grossly intact, no focal deficits, moving all 4 extremities. Psychiatric: Normal mood and affect.   Laboratory values:  Recent Labs    05/02/21 1037 05/03/21 1343  WBC 5.6 5.4  HGB 13.6 14.2  HCT 42.2 42.8   Recent Labs    05/02/21 1037 05/03/21 1027 05/03/21 1343 05/04/21 0356  NA 140 141  --  137  K 3.5 3.8  --  4.7  CL 107 108  --  107  CO2 25 24  --  22  GLUCOSE 128* 117*   --  160*  BUN 13 12  --  15  CREATININE 1.09 1.16 1.22 1.05  CALCIUM 8.8* 8.5*  --  8.4*   No results for input(s): LABPT, INR in the last 72 hours. No results for input(s): LABURIN in the last 72 hours. Results for orders placed or performed during the hospital encounter of 05/02/21  SARS CORONAVIRUS 2 (TAT 6-24 HRS) Nasopharyngeal Nasopharyngeal Swab     Status: None   Collection Time: 05/02/21 10:12 AM   Specimen: Nasopharyngeal Swab  Result Value Ref Range Status   SARS Coronavirus 2 NEGATIVE NEGATIVE Final    Comment: (NOTE) SARS-CoV-2 target nucleic acids are NOT DETECTED.  The SARS-CoV-2 RNA is generally detectable in upper and lower respiratory specimens during the acute phase of infection. Negative results do not preclude SARS-CoV-2 infection, do not rule out co-infections with other pathogens, and should not be used as the sole basis for treatment or other patient management decisions. Negative results must be combined with clinical observations, patient history, and epidemiological information. The expected result is Negative.  Fact Sheet for Patients: SugarRoll.be  Fact Sheet for Healthcare Providers: https://www.woods-mathews.com/  This test is not yet approved or cleared by the [place] FDA and  has been authorized for detection and/or diagnosis of SARS-CoV-2 by FDA under an Emergency Use Authorization (EUA). This EUA will remain  in effect (meaning this test can be used) for the duration of the COVID-19 declaration under Se ction 564(b)(1) of the Act,  21 U.S.C. section 360bbb-3(b)(1), unless the authorization is terminated or revoked sooner.  Performed at [facility], [place] [address], [place], [place] [address]     Disposition: Home  Discharge instruction: The patient was instructed to be ambulatory but told to refrain from heavy lifting, strenuous activity, or driving.   He is also encouraged to good  fluid intake to keep urine clear.    Discharge medications:  Allergies as of 05/04/2021       Reactions   Tramadol Other (See Comments)   Hallucination Pt states that it made crazy.        Medication List     STOP taking these medications    sodium phosphate 7-19 GM/118ML Enem       TAKE these medications    levofloxacin 500 MG tablet Commonly known as: LEVAQUIN Take 1 tablet (500 mg total) by mouth daily.   multivitamin with minerals tablet Take 1 tablet by mouth daily.   oxyCODONE 5 MG immediate release tablet Commonly known as: Oxy IR/ROXICODONE Take 1 tablet (5 mg total) by mouth every 4 (four) hours as needed for moderate pain.   senna-docusate 8.6-50 MG tablet Commonly known as: Senokot-S Take 2 tablets by mouth at bedtime.   tamsulosin 0.4 MG Caps capsule Commonly known as: FLOMAX Take 1 capsule (0.4 mg total) by mouth daily. What changed: when to take this         Followup:   Follow-up Information     Stoioff, Ronda Fairly, MD Follow up.   Specialty: Urology Why: Has Follow up scheduled on 05/10/2021 for Foley removal and PVR Contact information: [address] [place] [address] [phone]

## 2021-05-04 NOTE — Anesthesia Postprocedure Evaluation (Signed)
Anesthesia Post Note  Patient: Casey Acevedo  Procedure(s) Performed: PROSTATE BIOPSY TRANSRECTAL ULTRASOUND TRANSURETHRAL RESECTION OF THE PROSTATE (TURP)  Patient location during evaluation: PACU Anesthesia Type: General Level of consciousness: awake and alert Pain management: pain level controlled Vital Signs Assessment: post-procedure vital signs reviewed and stable Respiratory status: spontaneous breathing, nonlabored ventilation, respiratory function stable and patient connected to nasal cannula oxygen Cardiovascular status: blood pressure returned to baseline and stable Postop Assessment: no apparent nausea or vomiting Anesthetic complications: no   No notable events documented.   Last Vitals:  Vitals:   05/04/21 0843 05/04/21 1201  BP: 119/66   Pulse: (!) 44 86  Resp: 18   Temp: 36.6 C   SpO2: 98%     Last Pain:  Vitals:   05/04/21 0843  TempSrc: Oral  PainSc: 0-No pain                 Martha Clan

## 2021-05-08 ENCOUNTER — Telehealth: Payer: Self-pay | Admitting: Urology

## 2021-05-08 DIAGNOSIS — C61 Malignant neoplasm of prostate: Secondary | ICD-10-CM

## 2021-05-08 NOTE — Telephone Encounter (Signed)
I contacted Mr. Casey Acevedo on 05/06/2021 to discuss his prostate pathology report.  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  The pathology report was discussed and based on high-grade prostate cancer additional treatment will be recommended.  He had a CT of the abdomen pelvis June 2022 which showed no significant abnormalities.  We will schedule a bone scan and follow-up CT.  He is scheduled to see Zara Council next Tuesday for catheter removal/voiding trial.

## 2021-05-09 NOTE — Progress Notes (Signed)
05/10/2021 2:25 PM   Casey Acevedo Aug 09, 1940 [id number]  Referring provider: Idelle Crouch, MD [facility] [place],   [id number]  Chief Complaint  Patient presents with   Urinary Retention   Urological history: 1.  Bladder cancer -Low-grade urothelial carcinoma -Bladder mass seen incidentally on surveillance CTs of the chest, abdomen, pelvis in September 2021 for esophageal cancer -TURBT x 2 - most recent 02/2021  2.  Prostate cancer -high-grade prostate cancer -iPSA 14.0 -prostate biopsy 04/2021 - Gleason 4 + 5 -Bone scan and CT scan pending  3. 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 Urinary retention -post-operative after TURBT 02/2021   HPI: Casey Acevedo is a 81 y.o. male who presents today for Foley catheter removal after a TURP/TRUSBx of prostate 05/03/2021.  Catheter Removal  Patient is present today for a catheter removal.  15 ml of water was drained from the balloon. A 20 FR foley cath was removed from the bladder no complications were noted . Patient tolerated well.  He presents this afternoon stating he has been voiding well with a good strong stream.  PVR is 18 mL.  Patient denies any modifying or aggravating factors.  Patient denies any gross hematuria, dysuria or suprapubic/flank pain.  Patient denies any fevers, chills, nausea or vomiting.     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 ([place]) 06/05/2017    Surgical History: Past Surgical History:  Procedure Laterality Date   CHOLECYSTECTOMY     COLONOSCOPY     COMPLETE  ESOPHAGECTOMY N/A 06/18/2017   Procedure: cervical ESOPHAGECTOMY COMPLETE;  Surgeon: Grace Isaac, MD;  Location: [place];  Service: Thoracic;  Laterality: N/A;  NEED NIMS ET TUBE   ESOPHAGOGASTRODUODENOSCOPY (EGD) WITH PROPOFOL N/A 12/07/2016   Procedure: ESOPHAGOGASTRODUODENOSCOPY (EGD) WITH PROPOFOL;  Surgeon: Jonathon Bellows, MD;  Location[facility] ENDOSCOPY;  Service: Gastroenterology;  Laterality: N/A;   ESOPHAGOGASTRODUODENOSCOPY (EGD) WITH PROPOFOL N/A 12/26/2016   Procedure: ESOPHAGOGASTRODUODENOSCOPY (EGD) WITH PROPOFOL WITH DILATION;  Surgeon: Jonathon Bellows, MD;  Location: [facility] ENDOSCOPY;  Service: Gastroenterology;  Laterality: N/A;   EYE SURGERY Bilateral    Cataracts   GASTROJEJUNOSTOMY N/A 01/16/2017   Procedure: LAPROSCOPIC ASSIST FEEDING JEJUNOSTOMY TUBE;  Surgeon: Johnathan Hausen, MD;  [facility];  Service: General;  Laterality: N/A;   IR FLUORO GUIDE PORT INSERTION RIGHT  01/10/2017   IR REMOVAL TUN ACCESS W/ PORT W/O FL MOD SED  03/22/2018   IR REPLACE G-TUBE SIMPLE WO FLUORO  03/15/2017   IR [place] DUODEN/JEJUNO TUBE PERCUT W/FLUORO  03/05/2017   KYPHOPLASTY N/A 08/31/2020   Procedure: L1 KYPHOPLASTY;  Surgeon: Hessie Knows, MD;  Location: [facility]S;  Service: Orthopedics;  Laterality: N/A;   KYPHOPLASTY N/A 10/05/2020   Procedure: T12 KYPHOPLASTY;  Surgeon: Hessie Knows, MD;  Location: [facility];  Service: Orthopedics;  Laterality: N/A;   PICC LINE INSERTION Right    PROSTATE BIOPSY N/A 05/03/2021   Procedure: PROSTATE BIOPSY;  Surgeon: Abbie Sons, MD;  Location: [facility];  Service: Urology;  Laterality: N/A;   PYLOROMYOTOMY  N/A 06/18/2017   Procedure: Edwena Blow;  Surgeon: Grace Isaac, MD;  Location: [facility] OR;  Service: Thoracic;  Laterality: N/A;   TRANSRECTAL ULTRASOUND N/A 05/03/2021   Procedure: TRANSRECTAL ULTRASOUND;  Surgeon: Abbie Sons, MD;  Location: [facility];  Service: Urology;  Laterality: N/A;   TRANSURETHRAL RESECTION OF BLADDER TUMOR N/A  02/15/2021   Procedure: TRANSURETHRAL RESECTION OF BLADDER TUMOR (TURBT);  Surgeon: Abbie Sons, MD;  Location: [facility];  Service: Urology;  Laterality: N/A;   TRANSURETHRAL RESECTION OF BLADDER TUMOR WITH MITOMYCIN-C N/A 01/13/2020   Procedure: TRANSURETHRAL RESECTION OF BLADDER TUMOR WITH gemcitabine;  Surgeon: Abbie Sons, MD;  Location: [facility];  Service: Urology;  Laterality: N/A;   TRANSURETHRAL RESECTION OF PROSTATE N/A 05/03/2021   Procedure: TRANSURETHRAL RESECTION OF THE PROSTATE (TURP);  Surgeon: Abbie Sons, MD;  Location: [facility];  Service: Urology;  Laterality: N/A;   VIDEO BRONCHOSCOPY N/A 06/18/2017   Procedure: VIDEO BRONCHOSCOPY;  Surgeon: Grace Isaac, MD;  Location: [facility] OR;  Service: Thoracic;  Laterality: N/A;    Home Medications:  Allergies as of 05/10/2021       Reactions   Tramadol Other (See Comments)   Hallucination Pt states that it made crazy.        Medication List        Accurate as of May 10, 2021  2:25 PM. If you have any questions, ask your nurse or doctor.          levofloxacin 500 MG tablet Commonly known as: LEVAQUIN Take 1 tablet (500 mg total) by mouth daily.   multivitamin with minerals tablet Take 1 tablet by mouth daily.   oxyCODONE 5 MG immediate release tablet Commonly known as: Oxy IR/ROXICODONE Take 1 tablet (5 mg total) by mouth every 4 (four) hours as needed for moderate pain.   senna-docusate 8.6-50 MG tablet Commonly known as: Senokot-S Take 2 tablets by mouth at bedtime.   tamsulosin 0.4 MG Caps capsule Commonly known as: FLOMAX Take 1 capsule (0.4 mg total) by mouth daily. What changed: when to take this        Allergies:  Allergies  Allergen Reactions   Tramadol Other (See Comments)    Hallucination Pt states that it made crazy.    Family History: Family History  Problem Relation Age of Onset   Heart disease Father    Diabetes Mother    Hypertension Mother    Heart attack  Brother 42   Prostate cancer Neg Hx    Bladder Cancer Neg Hx    Kidney cancer Neg Hx     Social History:  reports that he has never smoked. He has never used smokeless tobacco. He reports that he does not drink alcohol and does not use drugs.  ROS: Pertinent ROS in HPI  Physical Exam: Constitutional:  Well nourished. Alert and oriented, No acute distress. HEENT: [place] AT, mask in place trachea midline Cardiovascular: No clubbing, cyanosis, or edema. Respiratory: Normal respiratory effort, no increased work of breathing. Neurologic: Grossly intact, no focal deficits, moving all 4 extremities. Psychiatric: Normal mood and affect.   Laboratory Data: N/A  Pertinent Imaging:  05/10/21 14:02  Scan Result 29m      Assessment & Plan:    1. Prostate cancer -high grade prostate cancer -bone scan and CT scan pending   2. 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  These notes generated with voice recognition software. I apologize for typographical errors.  Zara Council, PA-C  [facility] [address]  [place], [place] [address] [phone]

## 2021-05-10 ENCOUNTER — Other Ambulatory Visit: Payer: Self-pay

## 2021-05-10 ENCOUNTER — Ambulatory Visit: Payer: Medicare Other | Admitting: Urology

## 2021-05-10 ENCOUNTER — Ambulatory Visit (INDEPENDENT_AMBULATORY_CARE_PROVIDER_SITE_OTHER): Payer: Medicare Other | Admitting: Urology

## 2021-05-10 ENCOUNTER — Encounter: Payer: Self-pay | Admitting: Urology

## 2021-05-10 DIAGNOSIS — N401 Enlarged prostate with lower urinary tract symptoms: Secondary | ICD-10-CM | POA: Diagnosis not present

## 2021-05-10 DIAGNOSIS — C61 Malignant neoplasm of prostate: Secondary | ICD-10-CM

## 2021-05-10 DIAGNOSIS — R338 Other retention of urine: Secondary | ICD-10-CM | POA: Diagnosis not present

## 2021-05-10 LAB — BLADDER SCAN AMB NON-IMAGING

## 2021-05-17 ENCOUNTER — Ambulatory Visit
Admission: RE | Admit: 2021-05-17 | Discharge: 2021-05-17 | Disposition: A | Discharge status: Home / Self Care | Payer: Medicare Other | Source: Ambulatory Visit | Attending: Urology | Admitting: Urology

## 2021-05-17 ENCOUNTER — Encounter
Admission: RE | Admit: 2021-05-17 | Discharge: 2021-05-17 | Disposition: A | Discharge status: Home / Self Care | Payer: Medicare Other | Source: Ambulatory Visit | Attending: Urology | Admitting: Urology

## 2021-05-17 ENCOUNTER — Other Ambulatory Visit: Payer: Self-pay

## 2021-05-17 DIAGNOSIS — C61 Malignant neoplasm of prostate: Secondary | ICD-10-CM | POA: Insufficient documentation

## 2021-05-17 IMAGING — NM NM BONE WHOLE BODY
2 series · 10 of 10 positions shown · non-contrast
Comparison: Thoracic MRI [DATE]

CLINICAL DATA: High risk prostate cancer staging

EXAM:
NUCLEAR MEDICINE WHOLE BODY BONE SCAN
TECHNIQUE: Whole body anterior and posterior images were obtained approximately
3 hours after intravenous injection of radiopharmaceutical.
RADIOPHARMACEUTICALS:  20.9 mCi [WH] MDP IV

[Series 1000: statics · 2.40mm/px · 4 acquisitions, 8 frames shown]
[im 1/4]
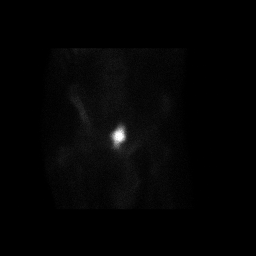
[im 1/4]
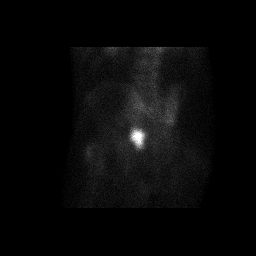
[im 2/4]
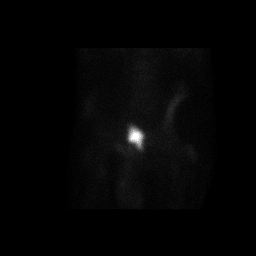
[im 2/4]
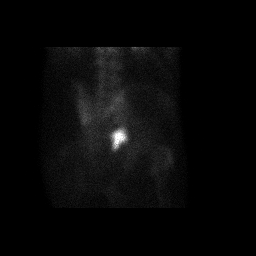
[im 3/4]
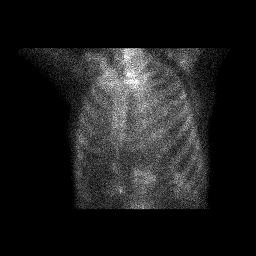
[im 3/4]
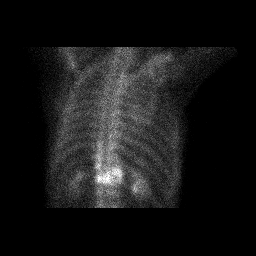
[im 4/4]
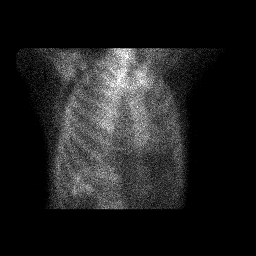
[im 4/4]
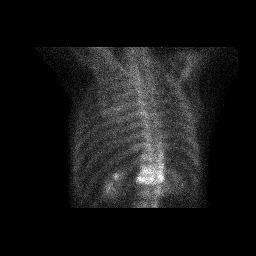

[Series 1000: 3 hr wholebody · 2.40mm/px · 2 of 2 frames shown]
[frame 1/2]
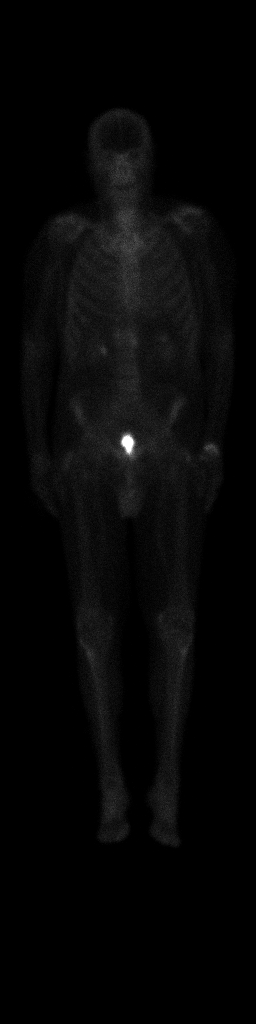
[frame 2/2]
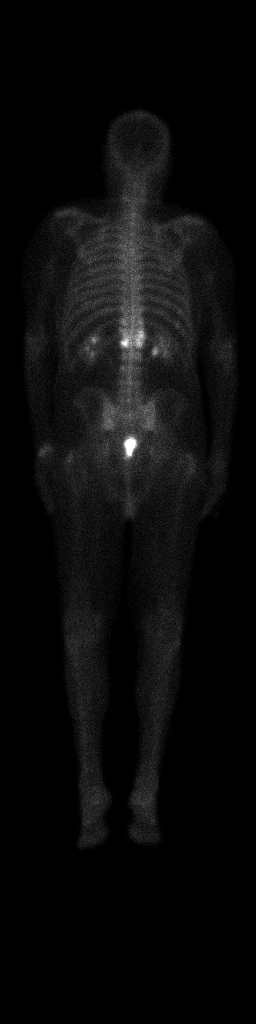

[10 of 10 positions shown; findings below may reference images not displayed]

FINDINGS: Normal bilateral distribution of the tracer seen in bilateral
kidneys and bladder.

Accentuated activity at the level of T12 and L1 where there were
compression fractures and marrow edema seen on prior. There may be
remote right posterior ninth and tenth rib fractures.
IMPRESSION: 1. Negative for metastatic pattern.
2. Abnormal activity at the thoracolumbar junction where compression
fractures were seen [DATE], suggest anatomic imaging for
confirmation and for orthopedic purposes given the long interval.

## 2021-05-17 MED ORDER — TECHNETIUM TC 99M MEDRONATE IV KIT
20.0000 | PACK | Freq: Once | INTRAVENOUS | Status: AC | PRN
  Administered 2021-05-17: 20.9 via INTRAVENOUS

## 2021-05-19 ENCOUNTER — Other Ambulatory Visit: Payer: Medicare Other | Admitting: Urology

## 2021-05-20 ENCOUNTER — Other Ambulatory Visit: Payer: Medicare Other | Admitting: Urology

## 2021-05-21 ENCOUNTER — Encounter: Payer: Self-pay | Admitting: Urology

## 2021-05-24 ENCOUNTER — Ambulatory Visit
Admission: RE | Admit: 2021-05-24 | Discharge: 2021-05-24 | Disposition: A | Discharge status: Home / Self Care | Payer: Medicare Other | Source: Ambulatory Visit | Attending: Urology | Admitting: Urology

## 2021-05-24 DIAGNOSIS — C61 Malignant neoplasm of prostate: Secondary | ICD-10-CM | POA: Diagnosis present

## 2021-05-24 IMAGING — CT CT ABD-PELV W/ CM
2 of 5 series · 15 of 46 positions shown, 17 images · IV contrast (APPLIED)
Comparison: NM bone scan [DATE] and CT chest, abdomen and
pelvis [DATE]

CLINICAL DATA: Prostate cancer.  Staging.

EXAM:
CT ABDOMEN AND PELVIS WITH CONTRAST
TECHNIQUE: Multidetector CT imaging of the abdomen and pelvis was performed
using the standard protocol following bolus administration of
intravenous contrast.

[Series 2: routine abd/pel with · axial · 0.76mm/px · z∈[-547,-137]mm · 12 of 94 slices shown, 14 images]
[im 6/94  soft-tissue]
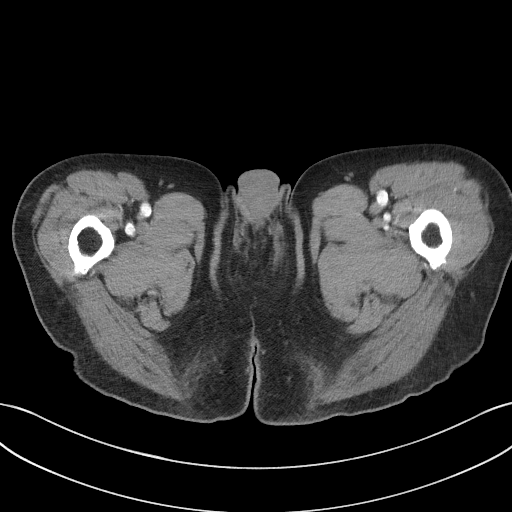
[im 6/94  bone]
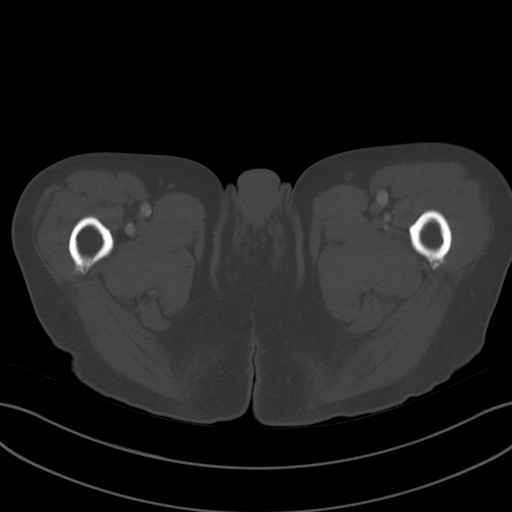
[im 17/94  soft-tissue]
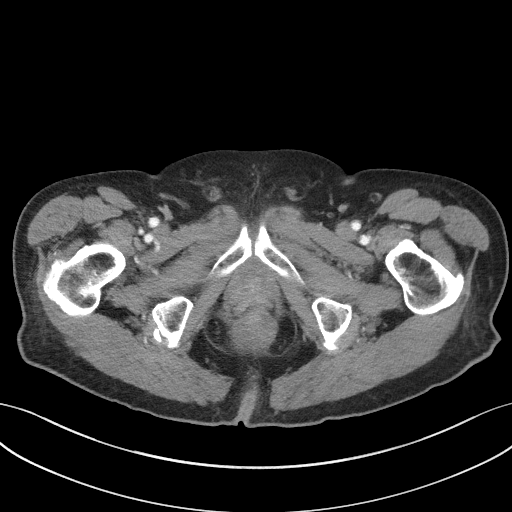
[im 22/94  soft-tissue]
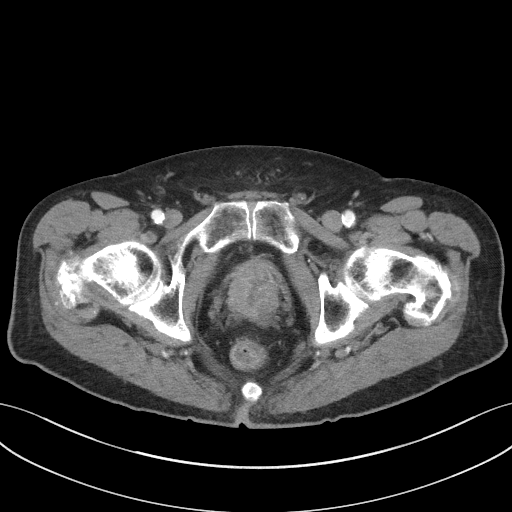
[im 28/94  soft-tissue]
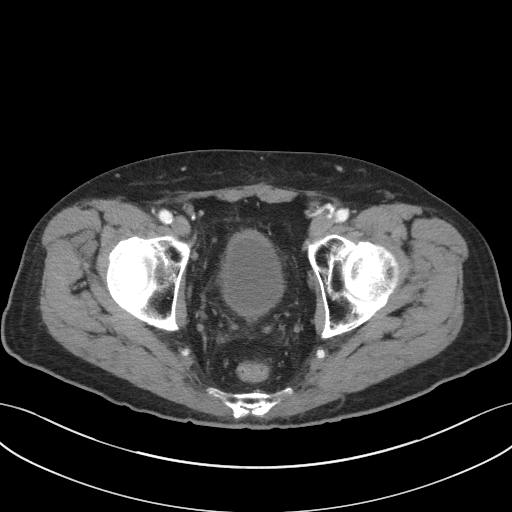
[im 39/94  soft-tissue]
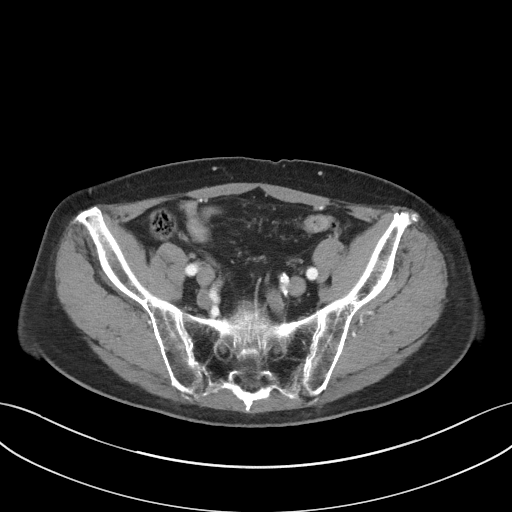
[im 44/94  soft-tissue]
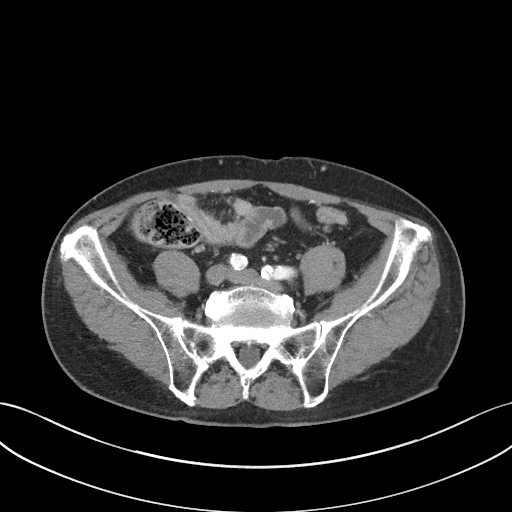
[im 50/94  soft-tissue]
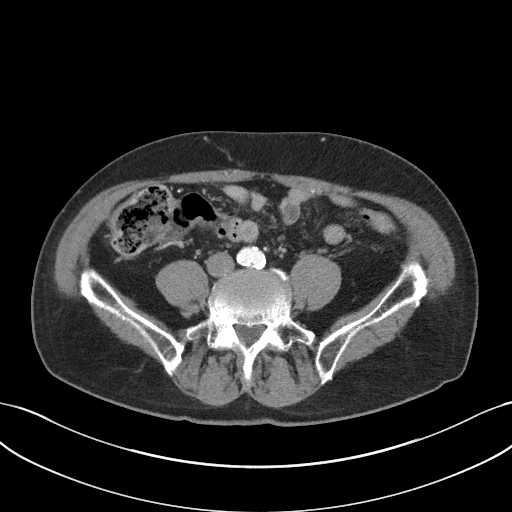
[im 61/94  soft-tissue]
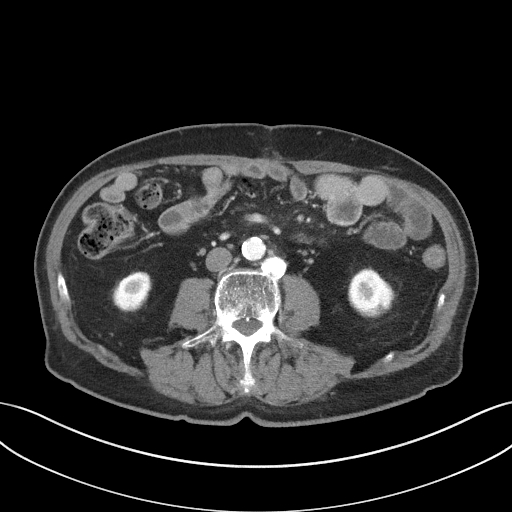
[im 66/94  soft-tissue]
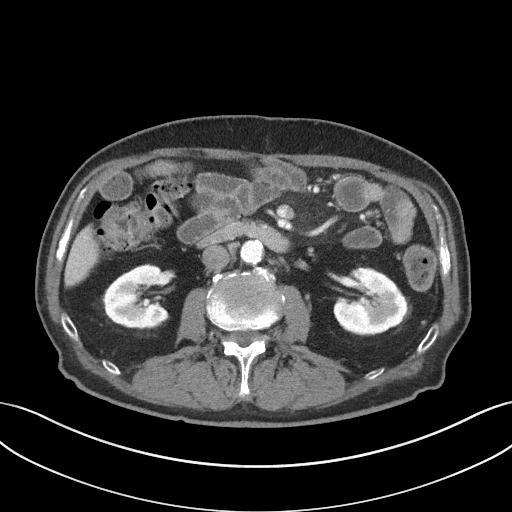
[im 66/94  bone]
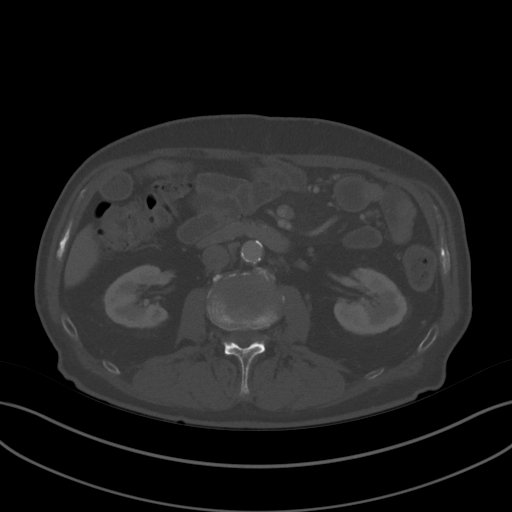
[im 72/94  soft-tissue]
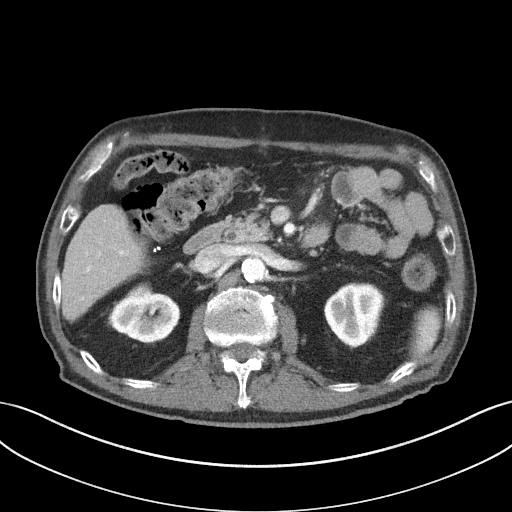
[im 83/94  soft-tissue]
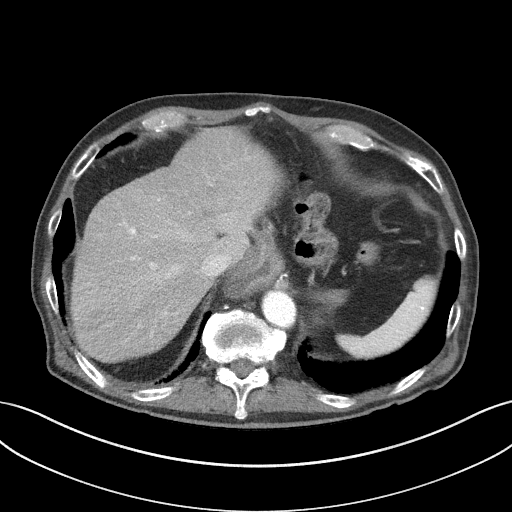
[im 88/94  soft-tissue]
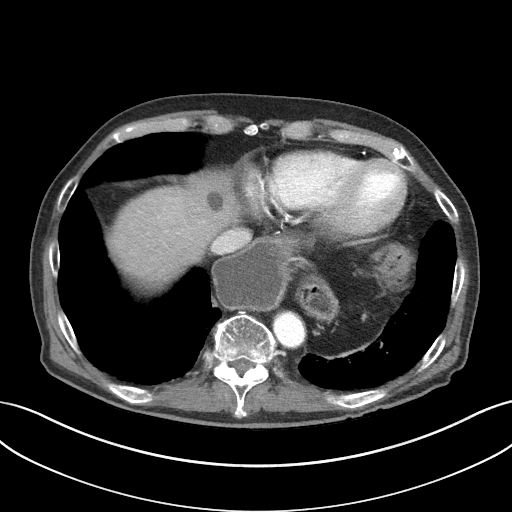

[Series 5: coronal st · coronal · 0.78mm/px · 3 of 86 slices shown]
[im 29/86  soft-tissue]
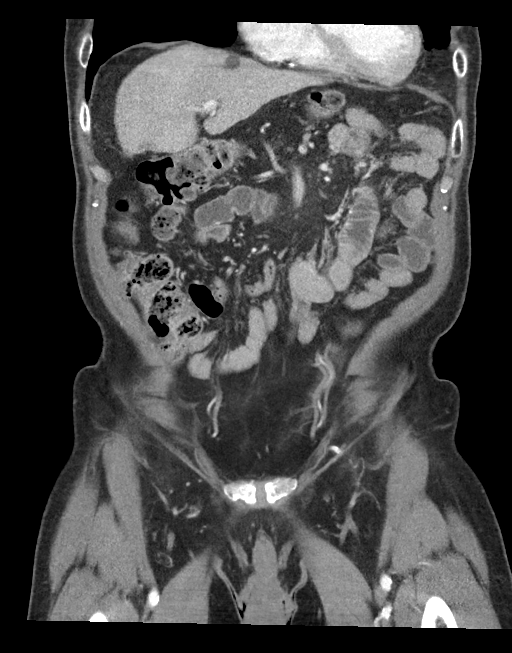
[im 38/86  soft-tissue]
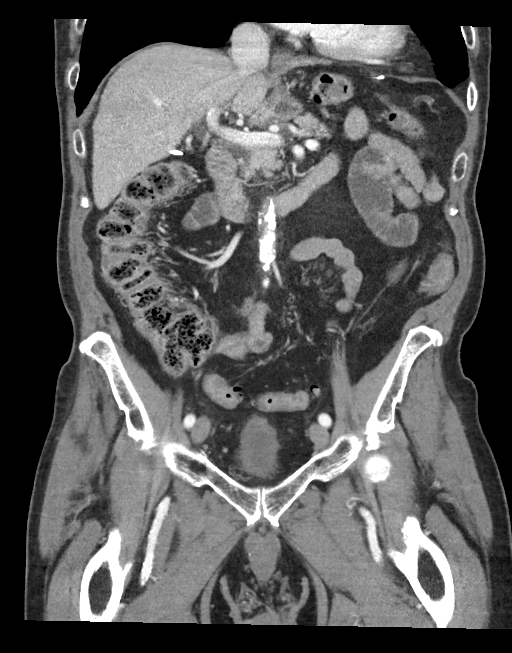
[im 48/86  soft-tissue]
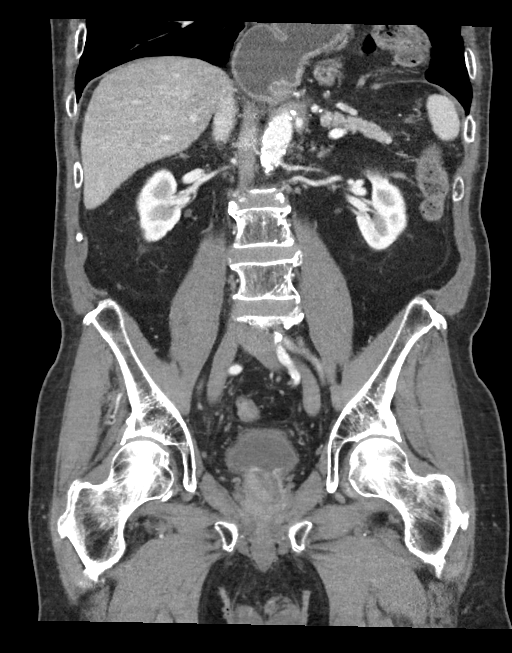

[15 of 46 positions shown; findings below may reference images not displayed]

RADIATION DOSE REDUCTION: This exam was performed according to the
departmental dose-optimization program which includes automated
exposure control, adjustment of the mA and/or kV according to
patient size and/or use of iterative reconstruction technique.

CONTRAST:  100mL OMNIPAQUE IOHEXOL 300 MG/ML  SOLN
FINDINGS: Lower chest: No acute abnormality.

Hepatobiliary: No suspicious liver lesions. Dome of liver cyst
measures 1.4 cm. Status post cholecystectomy. No significant bile
duct dilatation.

Pancreas: Unremarkable. No pancreatic ductal dilatation or
surrounding inflammatory changes.

Spleen: Normal in size without focal abnormality.

Adrenals/Urinary Tract: Normal adrenal glands. No kidney mass,
nephrolithiasis or hydronephrosis. Mild bladder wall thickening with
surrounding haziness is identified. No focal bladder abnormality.

Stomach/Bowel: Moderate to large hiatal hernia is identified
containing stomach as well as transverse colon. No bowel wall
thickening, inflammation, or distension. Sigmoid diverticula noted.

Vascular/Lymphatic: Aortic atherosclerosis. No abdominopelvic
adenopathy.

Reproductive: The prostate gland is enlarged with mass effect upon
the bladder base. There is low attenuation defect through the
prostate gland which may be the sequelae recent biopsy and/or
previous TURP.

Other: No free fluid or fluid collections

Musculoskeletal: No acute or significant osseous findings. There are
treated compression deformities involving the T12 and L1 vertebra.
Unchanged appearance of chronic T11 compression fracture. No
suspicious bone lesions.
IMPRESSION: 1. Enlarged prostate gland with low attenuation defect through the
prostate gland which may be the sequelae recent biopsy and/or
previous TURP.
2. No evidence for metastatic disease.
3. Moderate to large hiatal hernia containing stomach as well as
transverse colon.
4. Aortic Atherosclerosis ([NA]-[NA]).

## 2021-05-24 MED ORDER — IOHEXOL 300 MG/ML  SOLN
100.0000 mL | Freq: Once | INTRAMUSCULAR | Status: AC | PRN
Start: 2021-05-24 — End: 2021-05-24
  Administered 2021-05-24: 100 mL via INTRAVENOUS

## 2021-05-26 ENCOUNTER — Encounter: Payer: Self-pay | Admitting: Urology

## 2021-05-27 ENCOUNTER — Other Ambulatory Visit: Payer: Self-pay | Admitting: Urology

## 2021-05-27 DIAGNOSIS — C61 Malignant neoplasm of prostate: Secondary | ICD-10-CM

## 2021-05-30 DIAGNOSIS — C61 Malignant neoplasm of prostate: Secondary | ICD-10-CM | POA: Insufficient documentation

## 2021-06-03 ENCOUNTER — Ambulatory Visit
Admission: RE | Admit: 2021-06-03 | Discharge: 2021-06-03 | Disposition: A | Discharge status: Home / Self Care | Payer: Medicare Other | Source: Ambulatory Visit | Attending: Radiation Oncology | Admitting: Radiation Oncology

## 2021-06-03 ENCOUNTER — Other Ambulatory Visit: Payer: Self-pay

## 2021-06-03 VITALS — BP 133/74 | HR 80 | Temp 96.6°F | Resp 18 | Ht 69.0 in | Wt 157.2 lb

## 2021-06-03 DIAGNOSIS — I700 Atherosclerosis of aorta: Secondary | ICD-10-CM | POA: Diagnosis not present

## 2021-06-03 DIAGNOSIS — I2510 Atherosclerotic heart disease of native coronary artery without angina pectoris: Secondary | ICD-10-CM | POA: Insufficient documentation

## 2021-06-03 DIAGNOSIS — Z79899 Other long term (current) drug therapy: Secondary | ICD-10-CM | POA: Insufficient documentation

## 2021-06-03 DIAGNOSIS — C61 Malignant neoplasm of prostate: Secondary | ICD-10-CM | POA: Insufficient documentation

## 2021-06-03 NOTE — Consult Note (Signed)
?NEW PATIENT EVALUATION ? ?Name: Casey Acevedo  ?MRN: [id number]  ?Date:   06/03/2021     ?DOB: August 29, 1940 ? ? ?This 81 y.o. 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 ? ?REFERRING PHYSICIAN: Idelle Crouch, MD ? ?CHIEF COMPLAINT:  ?Chief Complaint  ?Patient presents with  ? Consult  ?  Prostate Cancer  ? ? ?DIAGNOSIS: The encounter diagnosis was Prostate cancer ([place]). ?  ?PREVIOUS INVESTIGATIONS:  ?CT scan and bone scan reviewed ?Pathology reports reviewed ?Clinical notes reviewed ? ?HPI: Patient is an 81 year old male well-known to our department having been treated back in 2018 for squamous of carcinoma of the esophagus with concurrent chemoradiation therapy followed by surgical resection.  He has done well from that standpoint with no evidence of disease at this time.  He is also been followed for low-grade bladder cancer by urology.  Patient has had TURBT x2 most recently in December 22.  Recently he was noted to have an elevation of his PSA to 14.  He underwent transrectal ultrasound-guided biopsy showing 6 of 6 cores positive for adenocarcinoma the majority was Gleason 9 (4+5).  He underwent bone scan showing no evidence to suggest metastatic disease as well as CT scan of the abdomen pelvis showing enlarged prostate with low-attenuation.  No evidence of metastatic disease or evidence of pelvic adenopathy was noted.  Patient is fairly asymptomatic specifically denies urgency frequency nocturia.  He is having no bone pain.  He is now referred to radiation oncology for opinion. ? ?PLANNED TREATMENT REGIMEN: Image guided IMRT radiation therapy plus ADT therapy ? 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   ? ?PAST SURGICAL HISTORY:  ?Past Surgical History:  ?Procedure Laterality Date  ? CHOLECYSTECTOMY    ? COLONOSCOPY    ? COMPLETE ESOPHAGECTOMY N/A 06/18/2017  ? Procedure: cervical ESOPHAGECTOMY COMPLETE;  Surgeon: Grace Isaac, MD;  Location: [place];  Service: Thoracic;  Laterality: N/A;  NEED NIMS ET TUBE  ? ESOPHAGOGASTRODUODENOSCOPY (EGD) WITH PROPOFOL N/A 12/07/2016  ? Procedure: ESOPHAGOGASTRODUODENOSCOPY (EGD) WITH PROPOFOL;  Surgeon: Jonathon Bellows, MD;  Location: [facility] ENDOSCOPY;  Service: Gastroenterology;  Laterality: N/A;  ? ESOPHAGOGASTRODUODENOSCOPY (EGD) WITH PROPOFOL N/A 12/26/2016  ? Procedure: ESOPHAGOGASTRODUODENOSCOPY (EGD) WITH PROPOFOL WITH DILATION;  Surgeon: Jonathon Bellows, MD;  Location: [facility] ENDOSCOPY;  Service: Gastroenterology;  Laterality: N/A;  ? EYE SURGERY Bilateral   ? Cataracts  ? GASTROJEJUNOSTOMY N/A 01/16/2017  ? Procedure: LAPROSCOPIC ASSIST FEEDING JEJUNOSTOMY TUBE;  Surgeon: Johnathan Hausen, MD;  [facility];  Service: General;  Laterality: N/A;  ? IR FLUORO GUIDE PORT INSERTION RIGHT  01/10/2017  ? IR REMOVAL TUN ACCESS W/ PORT W/O FL MOD SED  03/22/2018  ? IR REPLACE G-TUBE SIMPLE WO FLUORO  03/15/2017  ? IR [place] DUODEN/JEJUNO TUBE PERCUT W/FLUORO  03/05/2017  ? KYPHOPLASTY N/A 08/31/2020  ? Procedure: L1 KYPHOPLASTY;  Surgeon: Hessie Knows, MD;  Location: [facility];  Service: Orthopedics;  Laterality: N/A;  ? KYPHOPLASTY N/A 10/05/2020  ? Procedure: T12 KYPHOPLASTY;  Surgeon: Hessie Knows, MD;  Location: [facility];  Service: Orthopedics;  Laterality: N/A;  ? PICC LINE INSERTION Right   ? PROSTATE BIOPSY N/A 05/03/2021  ?  Procedure: PROSTATE BIOPSY;  Surgeon: Abbie Sons, MD;  Location: [facility];  Service: Urology;  Laterality: N/A;  ? PYLOROMYOTOMY N/A 06/18/2017  ? Procedure: PYLOROMYOTOMY;  Surgeon: Grace Isaac, MD;  Location: [place];   Service: Thoracic;  Laterality: N/A;  ? TRANSRECTAL ULTRASOUND N/A 05/03/2021  ? Procedure: TRANSRECTAL ULTRASOUND;  Surgeon: Abbie Sons, MD;  Location: [facility];  Service: Urology;  Laterality: N/A;  ? TRANSURETHRAL RESECTION OF BLADDER TUMOR N/A 02/15/2021  ? Procedure: TRANSURETHRAL RESECTION OF BLADDER TUMOR (TURBT);  Surgeon: Abbie Sons, MD;  Location: [facility];  Service: Urology;  Laterality: N/A;  ? TRANSURETHRAL RESECTION OF BLADDER TUMOR WITH MITOMYCIN-C N/A 01/13/2020  ? Procedure: TRANSURETHRAL RESECTION OF BLADDER TUMOR WITH gemcitabine;  Surgeon: Abbie Sons, MD;  Location: [facility];  Service: Urology;  Laterality: N/A;  ? TRANSURETHRAL RESECTION OF PROSTATE N/A 05/03/2021  ? Procedure: TRANSURETHRAL RESECTION OF THE PROSTATE (TURP);  Surgeon: Abbie Sons, MD;  Location: [facility];  Service: Urology;  Laterality: N/A;  ? VIDEO BRONCHOSCOPY N/A 06/18/2017  ? Procedure: VIDEO BRONCHOSCOPY;  Surgeon: Grace Isaac, MD;  Location: [facility] OR;  Service: Thoracic;  Laterality: N/A;  ? ? ?FAMILY HISTORY: family history includes Diabetes in his mother; Heart attack (age of onset: 55) in his brother; Heart disease in his father; Hypertension in his mother. ? ?SOCIAL HISTORY:  reports that he has never smoked. He has never used smokeless tobacco. He reports that he does not drink alcohol and does not use drugs. ? ?ALLERGIES: Tramadol ? ?MEDICATIONS:  ?Current Outpatient Medications  ?Medication Sig Dispense Refill  ? levofloxacin (LEVAQUIN) 500 MG tablet Take 1 tablet (500 mg total) by mouth daily. (Patient not taking: Reported on 06/03/2021) 7 tablet 0  ? Multiple Vitamins-Minerals (MULTIVITAMIN WITH MINERALS) tablet Take 1 tablet by mouth daily. (Patient not taking: Reported on 06/03/2021)    ? oxyCODONE (OXY IR/ROXICODONE) 5 MG immediate release tablet Take 1 tablet (5 mg total) by mouth every 4 (four) hours as needed for moderate pain. (Patient not taking: Reported on 06/03/2021) 5 tablet 0   ? senna-docusate (SENOKOT-S) 8.6-50 MG tablet Take 2 tablets by mouth at bedtime. (Patient not taking: Reported on 06/03/2021) 30 tablet 0  ? tamsulosin (FLOMAX) 0.4 MG CAPS capsule Take 1 capsule (0.4 mg total) by mouth daily. (Patient taking differently: Take 0.4 mg by mouth daily after breakfast.) 90 capsule 3  ? ?No current facility-administered medications for this encounter.  ? ? ?ECOG PERFORMANCE STATUS:  0 - Asymptomatic ? ?REVIEW OF SYSTEMS: Patient does have a history of bladder cancer as well as esophageal cancer both in clinical remission at this time ?Patient denies any weight loss, fatigue, weakness, fever, chills or night sweats. Patient denies any loss of vision, blurred vision. Patient denies any ringing  of the ears or hearing loss. No irregular heartbeat. Patient denies heart murmur or history of fainting. Patient denies any chest pain or pain radiating to her upper extremities. Patient denies any shortness of breath, difficulty breathing at night, cough or hemoptysis. Patient denies any swelling in the lower legs. Patient denies any nausea vomiting, vomiting of blood, or coffee ground material in the vomitus. Patient denies any stomach pain. Patient states has had normal bowel movements no significant constipation or diarrhea. Patient denies any dysuria, hematuria or significant nocturia. Patient denies any problems walking, swelling in the joints or loss of balance. Patient denies any skin changes, loss of hair or loss of weight. Patient denies any  excessive worrying or anxiety or significant depression. Patient denies any problems with insomnia. Patient denies excessive thirst, polyuria, polydipsia. Patient denies any swollen glands, patient denies easy bruising or easy bleeding. Patient denies any recent infections, allergies or URI. Patient "s visual fields have not changed significantly in recent time. ?  ?PHYSICAL EXAM: ?BP 133/74   Pulse 80   Temp (!) 96.6 ?F (35.9 ?C)   Resp 18   Ht  '5\' 9"'$  (1.753 m)   Wt 157 lb 3.2 oz (71.3 kg)   BMI 23.21 kg/m?  ?Well-developed well-nourished patient in NAD. HEENT reveals PERLA, EOMI, discs not visualized.  Oral cavity is clear. No oral mucosal lesions a

## 2021-06-10 ENCOUNTER — Telehealth: Payer: Self-pay

## 2021-06-10 NOTE — Telephone Encounter (Signed)
PA for Eligard submitted via [place] portal. Approval dates: ? ?06/10/21 - 06/11/22. Pt scheduled.  ?

## 2021-06-13 ENCOUNTER — Encounter: Payer: Self-pay | Admitting: Urology

## 2021-06-13 ENCOUNTER — Ambulatory Visit (INDEPENDENT_AMBULATORY_CARE_PROVIDER_SITE_OTHER): Payer: Medicare Other | Admitting: Urology

## 2021-06-13 VITALS — BP 149/71 | HR 43 | Ht 70.0 in | Wt 153.0 lb

## 2021-06-13 DIAGNOSIS — R338 Other retention of urine: Secondary | ICD-10-CM

## 2021-06-13 DIAGNOSIS — C61 Malignant neoplasm of prostate: Secondary | ICD-10-CM | POA: Diagnosis not present

## 2021-06-13 MED ORDER — LEVOFLOXACIN 500 MG PO TABS
500.0000 mg | ORAL_TABLET | Freq: Once | ORAL | Status: AC
  Administered 2021-06-13: 500 mg via ORAL

## 2021-06-13 MED ORDER — LEUPROLIDE ACETATE (6 MONTH) 45 MG ~~LOC~~ KIT
45.0000 mg | PACK | Freq: Once | SUBCUTANEOUS | Status: AC
  Administered 2021-06-13: 45 mg via SUBCUTANEOUS

## 2021-06-13 MED ORDER — GENTAMICIN SULFATE 40 MG/ML IJ SOLN
80.0000 mg | Freq: Once | INTRAMUSCULAR | Status: AC
  Administered 2021-06-13: 80 mg via INTRAMUSCULAR

## 2021-06-13 MED ORDER — TAMSULOSIN HCL 0.4 MG PO CAPS: 0.4000 mg | ORAL_CAPSULE | Freq: Every day | ORAL | 0 refills | Status: DC

## 2021-06-13 NOTE — Patient Instructions (Signed)
continue on Vitamin D 800-1000iu and Calcium 1000-'1200mg'$  daily while on Androgen  ?

## 2021-06-13 NOTE — Progress Notes (Signed)
Eligard SubQ Injection  ? ?Due to Prostate Cancer patient is present today for a Eligard Injection. ? ?Medication: Eligard 6 month ?Dose: 45 mg  ?Location: left  ?Lot: [id number] ?Exp: 01/12/2024 ? ?Patient tolerated well, no complications were noted ? ?Performed by: Elberta Leatherwood, [place] ? ?Per Dr. Baruch Gouty patient is to continue therapy for 2-3 years. Patient's next follow up was scheduled for With Dr. Baruch Gouty. This appointment was scheduled using wheel and given to patient today along with reminder continue on Vitamin D 800-1000iu and Calcium 1000-'1200mg'$  daily while on Androgen Deprivation Therapy.  ?PA approval dates:  ? ?

## 2021-06-16 ENCOUNTER — Ambulatory Visit
Admission: RE | Admit: 2021-06-16 | Discharge: 2021-06-16 | Disposition: A | Discharge status: Home / Self Care | Payer: Medicare Other | Source: Ambulatory Visit | Attending: Radiation Oncology | Admitting: Radiation Oncology

## 2021-06-16 DIAGNOSIS — Z510 Encounter for antineoplastic radiation therapy: Secondary | ICD-10-CM | POA: Diagnosis present

## 2021-06-16 DIAGNOSIS — C61 Malignant neoplasm of prostate: Secondary | ICD-10-CM | POA: Insufficient documentation

## 2021-06-19 NOTE — Progress Notes (Signed)
06/19/21 ? ?CC: gold fiducial marker placement ? ?HPI: 81 y.o. male with prostate cancer who presents today for placement of fiducial seed markers in anticipation of his upcoming IMRT with Dr. Baruch Gouty. ? ?Prostate Gold fiducial Marker Placement Procedure  ? ?Informed consent was obtained after discussing risks/benefits of the procedure.  A time out was performed to ensure correct patient identity. ? ?Pre-Procedure: ?- Gentamicin given prophylactically ?- PO Levaquin 500 mg also given today ? ?Procedure: ?- Lidocaine jelly was administered per rectum ?- Rectal ultrasound probe was placed without difficulty and the prostate visualized ?- Prostatic block performed with 10 mL 1% Xylocaine ?- 3 fiducial gold seed markers placed, one at right base, one at left base, one at apex of prostate gland under transrectal ultrasound guidance ? ?Post-Procedure: ?- Patient tolerated the procedure well ?- He was counseled to seek immediate medical attention if experiences any severe pain, significant bleeding, or fevers ?- ADT recommended 2-3 years by radiation oncology and he received a leuprolide injection today ? ? ? ?John Giovanni, MD ? ?

## 2021-06-21 DIAGNOSIS — Z510 Encounter for antineoplastic radiation therapy: Secondary | ICD-10-CM | POA: Diagnosis not present

## 2021-06-24 ENCOUNTER — Other Ambulatory Visit: Payer: Self-pay | Admitting: *Deleted

## 2021-06-24 DIAGNOSIS — C61 Malignant neoplasm of prostate: Secondary | ICD-10-CM

## 2021-06-27 ENCOUNTER — Ambulatory Visit: Admission: RE | Admit: 2021-06-27 | Payer: Medicare Other | Source: Ambulatory Visit

## 2021-06-28 ENCOUNTER — Other Ambulatory Visit: Payer: Self-pay

## 2021-06-28 ENCOUNTER — Ambulatory Visit
Admission: RE | Admit: 2021-06-28 | Discharge: 2021-06-28 | Disposition: A | Discharge status: Home / Self Care | Payer: Medicare Other | Source: Ambulatory Visit | Attending: Radiation Oncology | Admitting: Radiation Oncology

## 2021-06-28 DIAGNOSIS — Z510 Encounter for antineoplastic radiation therapy: Secondary | ICD-10-CM | POA: Diagnosis not present

## 2021-06-28 LAB — RAD ONC ARIA SESSION SUMMARY
Course Elapsed Days: 0
Plan Fractions Treated to Date: 1
Plan Prescribed Dose Per Fraction: 2 Gy
Plan Total Fractions Prescribed: 40
Plan Total Prescribed Dose: 80 Gy
Reference Point Dosage Given to Date: 2 Gy
Reference Point Session Dosage Given: 2 Gy
Session Number: 1

## 2021-06-29 ENCOUNTER — Other Ambulatory Visit: Payer: Self-pay

## 2021-06-29 ENCOUNTER — Ambulatory Visit
Admission: RE | Admit: 2021-06-29 | Discharge: 2021-06-29 | Disposition: A | Discharge status: Home / Self Care | Payer: Medicare Other | Source: Ambulatory Visit | Attending: Radiation Oncology | Admitting: Radiation Oncology

## 2021-06-29 DIAGNOSIS — Z510 Encounter for antineoplastic radiation therapy: Secondary | ICD-10-CM | POA: Diagnosis not present

## 2021-06-29 LAB — RAD ONC ARIA SESSION SUMMARY
Course Elapsed Days: 1
Plan Fractions Treated to Date: 2
Plan Prescribed Dose Per Fraction: 2 Gy
Plan Total Fractions Prescribed: 40
Plan Total Prescribed Dose: 80 Gy
Reference Point Dosage Given to Date: 4 Gy
Reference Point Session Dosage Given: 2 Gy
Session Number: 2

## 2021-06-30 ENCOUNTER — Ambulatory Visit
Admission: RE | Admit: 2021-06-30 | Discharge: 2021-06-30 | Disposition: A | Discharge status: Home / Self Care | Payer: Medicare Other | Source: Ambulatory Visit | Attending: Radiation Oncology | Admitting: Radiation Oncology

## 2021-06-30 ENCOUNTER — Other Ambulatory Visit: Payer: Self-pay

## 2021-06-30 DIAGNOSIS — Z510 Encounter for antineoplastic radiation therapy: Secondary | ICD-10-CM | POA: Diagnosis not present

## 2021-06-30 LAB — RAD ONC ARIA SESSION SUMMARY
Course Elapsed Days: 2
Plan Fractions Treated to Date: 3
Plan Prescribed Dose Per Fraction: 2 Gy
Plan Total Fractions Prescribed: 40
Plan Total Prescribed Dose: 80 Gy
Reference Point Dosage Given to Date: 6 Gy
Reference Point Session Dosage Given: 2 Gy
Session Number: 3

## 2021-07-01 ENCOUNTER — Other Ambulatory Visit: Payer: Self-pay

## 2021-07-01 ENCOUNTER — Ambulatory Visit
Admission: RE | Admit: 2021-07-01 | Discharge: 2021-07-01 | Disposition: A | Discharge status: Home / Self Care | Payer: Medicare Other | Source: Ambulatory Visit | Attending: Radiation Oncology | Admitting: Radiation Oncology

## 2021-07-01 DIAGNOSIS — Z510 Encounter for antineoplastic radiation therapy: Secondary | ICD-10-CM | POA: Diagnosis not present

## 2021-07-01 LAB — RAD ONC ARIA SESSION SUMMARY
Course Elapsed Days: 3
Plan Fractions Treated to Date: 4
Plan Prescribed Dose Per Fraction: 2 Gy
Plan Total Fractions Prescribed: 40
Plan Total Prescribed Dose: 80 Gy
Reference Point Dosage Given to Date: 8 Gy
Reference Point Session Dosage Given: 2 Gy
Session Number: 4

## 2021-07-04 ENCOUNTER — Ambulatory Visit
Admission: RE | Admit: 2021-07-04 | Discharge: 2021-07-04 | Disposition: A | Discharge status: Home / Self Care | Payer: Medicare Other | Source: Ambulatory Visit | Attending: Radiation Oncology | Admitting: Radiation Oncology

## 2021-07-04 ENCOUNTER — Other Ambulatory Visit: Payer: Self-pay

## 2021-07-04 DIAGNOSIS — Z510 Encounter for antineoplastic radiation therapy: Secondary | ICD-10-CM | POA: Diagnosis not present

## 2021-07-04 LAB — RAD ONC ARIA SESSION SUMMARY
Course Elapsed Days: 6
Plan Fractions Treated to Date: 5
Plan Prescribed Dose Per Fraction: 2 Gy
Plan Total Fractions Prescribed: 40
Plan Total Prescribed Dose: 80 Gy
Reference Point Dosage Given to Date: 10 Gy
Reference Point Session Dosage Given: 2 Gy
Session Number: 5

## 2021-07-05 ENCOUNTER — Ambulatory Visit
Admission: RE | Admit: 2021-07-05 | Discharge: 2021-07-05 | Disposition: A | Discharge status: Home / Self Care | Payer: Medicare Other | Source: Ambulatory Visit | Attending: Radiation Oncology | Admitting: Radiation Oncology

## 2021-07-05 ENCOUNTER — Other Ambulatory Visit: Payer: Self-pay

## 2021-07-05 DIAGNOSIS — Z510 Encounter for antineoplastic radiation therapy: Secondary | ICD-10-CM | POA: Diagnosis not present

## 2021-07-05 LAB — RAD ONC ARIA SESSION SUMMARY
Course Elapsed Days: 7
Plan Fractions Treated to Date: 6
Plan Prescribed Dose Per Fraction: 2 Gy
Plan Total Fractions Prescribed: 40
Plan Total Prescribed Dose: 80 Gy
Reference Point Dosage Given to Date: 12 Gy
Reference Point Session Dosage Given: 2 Gy
Session Number: 6

## 2021-07-06 ENCOUNTER — Other Ambulatory Visit: Payer: Self-pay

## 2021-07-06 ENCOUNTER — Ambulatory Visit
Admission: RE | Admit: 2021-07-06 | Discharge: 2021-07-06 | Disposition: A | Discharge status: Home / Self Care | Payer: Medicare Other | Source: Ambulatory Visit | Attending: Radiation Oncology | Admitting: Radiation Oncology

## 2021-07-06 DIAGNOSIS — Z510 Encounter for antineoplastic radiation therapy: Secondary | ICD-10-CM | POA: Diagnosis not present

## 2021-07-06 LAB — RAD ONC ARIA SESSION SUMMARY
Course Elapsed Days: 8
Plan Fractions Treated to Date: 7
Plan Prescribed Dose Per Fraction: 2 Gy
Plan Total Fractions Prescribed: 40
Plan Total Prescribed Dose: 80 Gy
Reference Point Dosage Given to Date: 14 Gy
Reference Point Session Dosage Given: 2 Gy
Session Number: 7

## 2021-07-07 ENCOUNTER — Ambulatory Visit
Admission: RE | Admit: 2021-07-07 | Discharge: 2021-07-07 | Disposition: A | Discharge status: Home / Self Care | Payer: Medicare Other | Source: Ambulatory Visit | Attending: Radiation Oncology | Admitting: Radiation Oncology

## 2021-07-07 ENCOUNTER — Other Ambulatory Visit: Payer: Self-pay

## 2021-07-07 DIAGNOSIS — Z510 Encounter for antineoplastic radiation therapy: Secondary | ICD-10-CM | POA: Diagnosis not present

## 2021-07-07 LAB — RAD ONC ARIA SESSION SUMMARY
Course Elapsed Days: 9
Plan Fractions Treated to Date: 8
Plan Prescribed Dose Per Fraction: 2 Gy
Plan Total Fractions Prescribed: 40
Plan Total Prescribed Dose: 80 Gy
Reference Point Dosage Given to Date: 16 Gy
Reference Point Session Dosage Given: 2 Gy
Session Number: 8

## 2021-07-08 ENCOUNTER — Ambulatory Visit
Admission: RE | Admit: 2021-07-08 | Discharge: 2021-07-08 | Disposition: A | Discharge status: Home / Self Care | Payer: Medicare Other | Source: Ambulatory Visit | Attending: Radiation Oncology | Admitting: Radiation Oncology

## 2021-07-08 ENCOUNTER — Other Ambulatory Visit: Payer: Self-pay

## 2021-07-08 DIAGNOSIS — Z510 Encounter for antineoplastic radiation therapy: Secondary | ICD-10-CM | POA: Diagnosis not present

## 2021-07-08 LAB — RAD ONC ARIA SESSION SUMMARY
Course Elapsed Days: 10
Plan Fractions Treated to Date: 9
Plan Prescribed Dose Per Fraction: 2 Gy
Plan Total Fractions Prescribed: 40
Plan Total Prescribed Dose: 80 Gy
Reference Point Dosage Given to Date: 18 Gy
Reference Point Session Dosage Given: 2 Gy
Session Number: 9

## 2021-07-11 ENCOUNTER — Ambulatory Visit
Admission: RE | Admit: 2021-07-11 | Discharge: 2021-07-11 | Disposition: A | Discharge status: Home / Self Care | Payer: Medicare Other | Source: Ambulatory Visit | Attending: Radiation Oncology | Admitting: Radiation Oncology

## 2021-07-11 ENCOUNTER — Other Ambulatory Visit: Payer: Self-pay

## 2021-07-11 DIAGNOSIS — C61 Malignant neoplasm of prostate: Secondary | ICD-10-CM | POA: Insufficient documentation

## 2021-07-11 DIAGNOSIS — Z510 Encounter for antineoplastic radiation therapy: Secondary | ICD-10-CM | POA: Insufficient documentation

## 2021-07-11 LAB — RAD ONC ARIA SESSION SUMMARY
Course Elapsed Days: 13
Plan Fractions Treated to Date: 10
Plan Prescribed Dose Per Fraction: 2 Gy
Plan Total Fractions Prescribed: 40
Plan Total Prescribed Dose: 80 Gy
Reference Point Dosage Given to Date: 20 Gy
Reference Point Session Dosage Given: 2 Gy
Session Number: 10

## 2021-07-12 ENCOUNTER — Ambulatory Visit
Admission: RE | Admit: 2021-07-12 | Discharge: 2021-07-12 | Disposition: A | Discharge status: Home / Self Care | Payer: Medicare Other | Source: Ambulatory Visit | Attending: Radiation Oncology | Admitting: Radiation Oncology

## 2021-07-12 ENCOUNTER — Other Ambulatory Visit: Payer: Self-pay

## 2021-07-12 DIAGNOSIS — Z510 Encounter for antineoplastic radiation therapy: Secondary | ICD-10-CM | POA: Diagnosis not present

## 2021-07-12 LAB — RAD ONC ARIA SESSION SUMMARY
Course Elapsed Days: 14
Plan Fractions Treated to Date: 11
Plan Prescribed Dose Per Fraction: 2 Gy
Plan Total Fractions Prescribed: 40
Plan Total Prescribed Dose: 80 Gy
Reference Point Dosage Given to Date: 22 Gy
Reference Point Session Dosage Given: 2 Gy
Session Number: 11

## 2021-07-13 ENCOUNTER — Other Ambulatory Visit: Payer: Self-pay

## 2021-07-13 ENCOUNTER — Ambulatory Visit
Admission: RE | Admit: 2021-07-13 | Discharge: 2021-07-13 | Disposition: A | Discharge status: Home / Self Care | Payer: Medicare Other | Source: Ambulatory Visit | Attending: Radiation Oncology | Admitting: Radiation Oncology

## 2021-07-13 ENCOUNTER — Inpatient Hospital Stay: Payer: Medicare Other | Attending: Radiation Oncology

## 2021-07-13 DIAGNOSIS — Z510 Encounter for antineoplastic radiation therapy: Secondary | ICD-10-CM | POA: Diagnosis not present

## 2021-07-13 DIAGNOSIS — C61 Malignant neoplasm of prostate: Secondary | ICD-10-CM | POA: Insufficient documentation

## 2021-07-13 LAB — CBC
HCT: 44.9 % (ref 39.0–52.0)
Hemoglobin: 14.4 g/dL (ref 13.0–17.0)
MCH: 29.6 pg (ref 26.0–34.0)
MCHC: 32.1 g/dL (ref 30.0–36.0)
MCV: 92.2 fL (ref 80.0–100.0)
Platelets: 157 10*3/uL (ref 150–400)
RBC: 4.87 MIL/uL (ref 4.22–5.81)
RDW: 14 % (ref 11.5–15.5)
WBC: 5 10*3/uL (ref 4.0–10.5)
nRBC: 0 % (ref 0.0–0.2)

## 2021-07-13 LAB — RAD ONC ARIA SESSION SUMMARY
Course Elapsed Days: 15
Plan Fractions Treated to Date: 12
Plan Prescribed Dose Per Fraction: 2 Gy
Plan Total Fractions Prescribed: 40
Plan Total Prescribed Dose: 80 Gy
Reference Point Dosage Given to Date: 24 Gy
Reference Point Session Dosage Given: 2 Gy
Session Number: 12

## 2021-07-14 ENCOUNTER — Other Ambulatory Visit: Payer: Self-pay

## 2021-07-14 ENCOUNTER — Ambulatory Visit
Admission: RE | Admit: 2021-07-14 | Discharge: 2021-07-14 | Disposition: A | Discharge status: Home / Self Care | Payer: Medicare Other | Source: Ambulatory Visit | Attending: Radiation Oncology | Admitting: Radiation Oncology

## 2021-07-14 DIAGNOSIS — Z510 Encounter for antineoplastic radiation therapy: Secondary | ICD-10-CM | POA: Diagnosis not present

## 2021-07-14 LAB — RAD ONC ARIA SESSION SUMMARY
Course Elapsed Days: 16
Plan Fractions Treated to Date: 13
Plan Prescribed Dose Per Fraction: 2 Gy
Plan Total Fractions Prescribed: 40
Plan Total Prescribed Dose: 80 Gy
Reference Point Dosage Given to Date: 26 Gy
Reference Point Session Dosage Given: 2 Gy
Session Number: 13

## 2021-07-15 ENCOUNTER — Ambulatory Visit
Admission: RE | Admit: 2021-07-15 | Discharge: 2021-07-15 | Disposition: A | Discharge status: Home / Self Care | Payer: Medicare Other | Source: Ambulatory Visit | Attending: Radiation Oncology | Admitting: Radiation Oncology

## 2021-07-15 ENCOUNTER — Other Ambulatory Visit: Payer: Self-pay

## 2021-07-15 DIAGNOSIS — Z510 Encounter for antineoplastic radiation therapy: Secondary | ICD-10-CM | POA: Diagnosis not present

## 2021-07-15 LAB — RAD ONC ARIA SESSION SUMMARY
Course Elapsed Days: 17
Plan Fractions Treated to Date: 14
Plan Prescribed Dose Per Fraction: 2 Gy
Plan Total Fractions Prescribed: 40
Plan Total Prescribed Dose: 80 Gy
Reference Point Dosage Given to Date: 28 Gy
Reference Point Session Dosage Given: 2 Gy
Session Number: 14

## 2021-07-18 ENCOUNTER — Other Ambulatory Visit: Payer: Self-pay

## 2021-07-18 ENCOUNTER — Ambulatory Visit
Admission: RE | Admit: 2021-07-18 | Discharge: 2021-07-18 | Disposition: A | Discharge status: Home / Self Care | Payer: Medicare Other | Source: Ambulatory Visit | Attending: Radiation Oncology | Admitting: Radiation Oncology

## 2021-07-18 DIAGNOSIS — Z510 Encounter for antineoplastic radiation therapy: Secondary | ICD-10-CM | POA: Diagnosis not present

## 2021-07-18 LAB — RAD ONC ARIA SESSION SUMMARY
Course Elapsed Days: 20
Plan Fractions Treated to Date: 15
Plan Prescribed Dose Per Fraction: 2 Gy
Plan Total Fractions Prescribed: 40
Plan Total Prescribed Dose: 80 Gy
Reference Point Dosage Given to Date: 30 Gy
Reference Point Session Dosage Given: 2 Gy
Session Number: 15

## 2021-07-19 ENCOUNTER — Ambulatory Visit
Admission: RE | Admit: 2021-07-19 | Discharge: 2021-07-19 | Disposition: A | Discharge status: Home / Self Care | Payer: Medicare Other | Source: Ambulatory Visit | Attending: Radiation Oncology | Admitting: Radiation Oncology

## 2021-07-19 ENCOUNTER — Other Ambulatory Visit: Payer: Self-pay

## 2021-07-19 DIAGNOSIS — Z510 Encounter for antineoplastic radiation therapy: Secondary | ICD-10-CM | POA: Diagnosis not present

## 2021-07-19 LAB — RAD ONC ARIA SESSION SUMMARY
Course Elapsed Days: 21
Plan Fractions Treated to Date: 16
Plan Prescribed Dose Per Fraction: 2 Gy
Plan Total Fractions Prescribed: 40
Plan Total Prescribed Dose: 80 Gy
Reference Point Dosage Given to Date: 32 Gy
Reference Point Session Dosage Given: 2 Gy
Session Number: 16

## 2021-07-20 ENCOUNTER — Other Ambulatory Visit: Payer: Self-pay

## 2021-07-20 ENCOUNTER — Ambulatory Visit
Admission: RE | Admit: 2021-07-20 | Discharge: 2021-07-20 | Disposition: A | Discharge status: Home / Self Care | Payer: Medicare Other | Source: Ambulatory Visit | Attending: Radiation Oncology | Admitting: Radiation Oncology

## 2021-07-20 DIAGNOSIS — Z510 Encounter for antineoplastic radiation therapy: Secondary | ICD-10-CM | POA: Diagnosis not present

## 2021-07-20 LAB — RAD ONC ARIA SESSION SUMMARY
Course Elapsed Days: 22
Plan Fractions Treated to Date: 17
Plan Prescribed Dose Per Fraction: 2 Gy
Plan Total Fractions Prescribed: 40
Plan Total Prescribed Dose: 80 Gy
Reference Point Dosage Given to Date: 34 Gy
Reference Point Session Dosage Given: 2 Gy
Session Number: 17

## 2021-07-21 ENCOUNTER — Ambulatory Visit
Admission: RE | Admit: 2021-07-21 | Discharge: 2021-07-21 | Disposition: A | Discharge status: Home / Self Care | Payer: Medicare Other | Source: Ambulatory Visit | Attending: Radiation Oncology | Admitting: Radiation Oncology

## 2021-07-21 ENCOUNTER — Other Ambulatory Visit: Payer: Self-pay

## 2021-07-21 DIAGNOSIS — Z510 Encounter for antineoplastic radiation therapy: Secondary | ICD-10-CM | POA: Diagnosis not present

## 2021-07-21 LAB — RAD ONC ARIA SESSION SUMMARY
Course Elapsed Days: 23
Plan Fractions Treated to Date: 18
Plan Prescribed Dose Per Fraction: 2 Gy
Plan Total Fractions Prescribed: 40
Plan Total Prescribed Dose: 80 Gy
Reference Point Dosage Given to Date: 36 Gy
Reference Point Session Dosage Given: 2 Gy
Session Number: 18

## 2021-07-22 ENCOUNTER — Other Ambulatory Visit: Payer: Self-pay

## 2021-07-22 ENCOUNTER — Ambulatory Visit
Admission: RE | Admit: 2021-07-22 | Discharge: 2021-07-22 | Disposition: A | Discharge status: Home / Self Care | Payer: Medicare Other | Source: Ambulatory Visit | Attending: Radiation Oncology | Admitting: Radiation Oncology

## 2021-07-22 DIAGNOSIS — Z510 Encounter for antineoplastic radiation therapy: Secondary | ICD-10-CM | POA: Diagnosis not present

## 2021-07-22 LAB — RAD ONC ARIA SESSION SUMMARY
Course Elapsed Days: 24
Plan Fractions Treated to Date: 19
Plan Prescribed Dose Per Fraction: 2 Gy
Plan Total Fractions Prescribed: 40
Plan Total Prescribed Dose: 80 Gy
Reference Point Dosage Given to Date: 38 Gy
Reference Point Session Dosage Given: 2 Gy
Session Number: 19

## 2021-07-25 ENCOUNTER — Ambulatory Visit
Admission: RE | Admit: 2021-07-25 | Discharge: 2021-07-25 | Disposition: A | Discharge status: Home / Self Care | Payer: Medicare Other | Source: Ambulatory Visit | Attending: Radiation Oncology | Admitting: Radiation Oncology

## 2021-07-25 ENCOUNTER — Other Ambulatory Visit: Payer: Self-pay

## 2021-07-25 DIAGNOSIS — Z510 Encounter for antineoplastic radiation therapy: Secondary | ICD-10-CM | POA: Diagnosis not present

## 2021-07-25 LAB — RAD ONC ARIA SESSION SUMMARY
Course Elapsed Days: 27
Plan Fractions Treated to Date: 20
Plan Prescribed Dose Per Fraction: 2 Gy
Plan Total Fractions Prescribed: 40
Plan Total Prescribed Dose: 80 Gy
Reference Point Dosage Given to Date: 40 Gy
Reference Point Session Dosage Given: 2 Gy
Session Number: 20

## 2021-07-26 ENCOUNTER — Other Ambulatory Visit: Payer: Self-pay

## 2021-07-26 ENCOUNTER — Other Ambulatory Visit: Payer: Self-pay | Admitting: *Deleted

## 2021-07-26 ENCOUNTER — Ambulatory Visit
Admission: RE | Admit: 2021-07-26 | Discharge: 2021-07-26 | Disposition: A | Discharge status: Home / Self Care | Payer: Medicare Other | Source: Ambulatory Visit | Attending: Radiation Oncology | Admitting: Radiation Oncology

## 2021-07-26 DIAGNOSIS — Z510 Encounter for antineoplastic radiation therapy: Secondary | ICD-10-CM | POA: Diagnosis not present

## 2021-07-26 LAB — RAD ONC ARIA SESSION SUMMARY
Course Elapsed Days: 28
Plan Fractions Treated to Date: 21
Plan Prescribed Dose Per Fraction: 2 Gy
Plan Total Fractions Prescribed: 40
Plan Total Prescribed Dose: 80 Gy
Reference Point Dosage Given to Date: 42 Gy
Reference Point Session Dosage Given: 2 Gy
Session Number: 21

## 2021-07-26 MED ORDER — DIPHENOXYLATE-ATROPINE 2.5-0.025 MG PO TABS: 1.0000 | ORAL_TABLET | Freq: Four times a day (QID) | ORAL | 1 refills | Status: DC | PRN

## 2021-07-27 ENCOUNTER — Inpatient Hospital Stay: Payer: Medicare Other

## 2021-07-27 ENCOUNTER — Ambulatory Visit
Admission: RE | Admit: 2021-07-27 | Discharge: 2021-07-27 | Disposition: A | Discharge status: Home / Self Care | Payer: Medicare Other | Source: Ambulatory Visit | Attending: Radiation Oncology | Admitting: Radiation Oncology

## 2021-07-27 ENCOUNTER — Other Ambulatory Visit: Payer: Self-pay

## 2021-07-27 DIAGNOSIS — Z510 Encounter for antineoplastic radiation therapy: Secondary | ICD-10-CM | POA: Diagnosis not present

## 2021-07-27 DIAGNOSIS — C61 Malignant neoplasm of prostate: Secondary | ICD-10-CM

## 2021-07-27 LAB — RAD ONC ARIA SESSION SUMMARY
Course Elapsed Days: 29
Plan Fractions Treated to Date: 22
Plan Prescribed Dose Per Fraction: 2 Gy
Plan Total Fractions Prescribed: 40
Plan Total Prescribed Dose: 80 Gy
Reference Point Dosage Given to Date: 44 Gy
Reference Point Session Dosage Given: 2 Gy
Session Number: 22

## 2021-07-27 LAB — CBC
HCT: 42 % (ref 39.0–52.0)
Hemoglobin: 14 g/dL (ref 13.0–17.0)
MCH: 30 pg (ref 26.0–34.0)
MCHC: 33.3 g/dL (ref 30.0–36.0)
MCV: 90.1 fL (ref 80.0–100.0)
Platelets: 180 10*3/uL (ref 150–400)
RBC: 4.66 MIL/uL (ref 4.22–5.81)
RDW: 13.7 % (ref 11.5–15.5)
WBC: 4.1 10*3/uL (ref 4.0–10.5)
nRBC: 0 % (ref 0.0–0.2)

## 2021-07-28 ENCOUNTER — Other Ambulatory Visit: Payer: Self-pay

## 2021-07-28 ENCOUNTER — Ambulatory Visit
Admission: RE | Admit: 2021-07-28 | Discharge: 2021-07-28 | Disposition: A | Discharge status: Home / Self Care | Payer: Medicare Other | Source: Ambulatory Visit | Attending: Radiation Oncology | Admitting: Radiation Oncology

## 2021-07-28 ENCOUNTER — Other Ambulatory Visit: Payer: Self-pay | Admitting: *Deleted

## 2021-07-28 DIAGNOSIS — Z510 Encounter for antineoplastic radiation therapy: Secondary | ICD-10-CM | POA: Diagnosis not present

## 2021-07-28 LAB — RAD ONC ARIA SESSION SUMMARY
Course Elapsed Days: 30
Plan Fractions Treated to Date: 23
Plan Prescribed Dose Per Fraction: 2 Gy
Plan Total Fractions Prescribed: 40
Plan Total Prescribed Dose: 80 Gy
Reference Point Dosage Given to Date: 46 Gy
Reference Point Session Dosage Given: 2 Gy
Session Number: 23

## 2021-07-28 MED ORDER — DIPHENOXYLATE-ATROPINE 2.5-0.025 MG PO TABS: 1.0000 | ORAL_TABLET | Freq: Four times a day (QID) | ORAL | 1 refills | Status: DC | PRN

## 2021-07-29 ENCOUNTER — Other Ambulatory Visit: Payer: Self-pay

## 2021-07-29 ENCOUNTER — Ambulatory Visit
Admission: RE | Admit: 2021-07-29 | Discharge: 2021-07-29 | Disposition: A | Discharge status: Home / Self Care | Payer: Medicare Other | Source: Ambulatory Visit | Attending: Radiation Oncology | Admitting: Radiation Oncology

## 2021-07-29 DIAGNOSIS — Z510 Encounter for antineoplastic radiation therapy: Secondary | ICD-10-CM | POA: Diagnosis not present

## 2021-07-29 LAB — RAD ONC ARIA SESSION SUMMARY
Course Elapsed Days: 31
Plan Fractions Treated to Date: 24
Plan Prescribed Dose Per Fraction: 2 Gy
Plan Total Fractions Prescribed: 40
Plan Total Prescribed Dose: 80 Gy
Reference Point Dosage Given to Date: 48 Gy
Reference Point Session Dosage Given: 2 Gy
Session Number: 24

## 2021-08-01 ENCOUNTER — Ambulatory Visit
Admission: RE | Admit: 2021-08-01 | Discharge: 2021-08-01 | Disposition: A | Discharge status: Home / Self Care | Payer: Medicare Other | Source: Ambulatory Visit | Attending: Radiation Oncology | Admitting: Radiation Oncology

## 2021-08-01 ENCOUNTER — Other Ambulatory Visit: Payer: Self-pay

## 2021-08-01 DIAGNOSIS — Z510 Encounter for antineoplastic radiation therapy: Secondary | ICD-10-CM | POA: Diagnosis not present

## 2021-08-01 LAB — RAD ONC ARIA SESSION SUMMARY
Course Elapsed Days: 34
Plan Fractions Treated to Date: 25
Plan Prescribed Dose Per Fraction: 2 Gy
Plan Total Fractions Prescribed: 40
Plan Total Prescribed Dose: 80 Gy
Reference Point Dosage Given to Date: 50 Gy
Reference Point Session Dosage Given: 2 Gy
Session Number: 25

## 2021-08-02 ENCOUNTER — Ambulatory Visit
Admission: RE | Admit: 2021-08-02 | Discharge: 2021-08-02 | Disposition: A | Discharge status: Home / Self Care | Payer: Medicare Other | Source: Ambulatory Visit | Attending: Radiation Oncology | Admitting: Radiation Oncology

## 2021-08-02 ENCOUNTER — Other Ambulatory Visit: Payer: Self-pay

## 2021-08-02 DIAGNOSIS — Z510 Encounter for antineoplastic radiation therapy: Secondary | ICD-10-CM | POA: Diagnosis not present

## 2021-08-02 LAB — RAD ONC ARIA SESSION SUMMARY
Course Elapsed Days: 35
Plan Fractions Treated to Date: 26
Plan Prescribed Dose Per Fraction: 2 Gy
Plan Total Fractions Prescribed: 40
Plan Total Prescribed Dose: 80 Gy
Reference Point Dosage Given to Date: 52 Gy
Reference Point Session Dosage Given: 2 Gy
Session Number: 26

## 2021-08-03 ENCOUNTER — Ambulatory Visit
Admission: RE | Admit: 2021-08-03 | Discharge: 2021-08-03 | Disposition: A | Discharge status: Home / Self Care | Payer: Medicare Other | Source: Ambulatory Visit | Attending: Radiation Oncology | Admitting: Radiation Oncology

## 2021-08-03 ENCOUNTER — Other Ambulatory Visit: Payer: Self-pay

## 2021-08-03 DIAGNOSIS — Z510 Encounter for antineoplastic radiation therapy: Secondary | ICD-10-CM | POA: Diagnosis not present

## 2021-08-03 LAB — RAD ONC ARIA SESSION SUMMARY
Course Elapsed Days: 36
Plan Fractions Treated to Date: 27
Plan Prescribed Dose Per Fraction: 2 Gy
Plan Total Fractions Prescribed: 40
Plan Total Prescribed Dose: 80 Gy
Reference Point Dosage Given to Date: 54 Gy
Reference Point Session Dosage Given: 2 Gy
Session Number: 27

## 2021-08-04 ENCOUNTER — Ambulatory Visit
Admission: RE | Admit: 2021-08-04 | Discharge: 2021-08-04 | Disposition: A | Discharge status: Home / Self Care | Payer: Medicare Other | Source: Ambulatory Visit | Attending: Radiation Oncology | Admitting: Radiation Oncology

## 2021-08-04 ENCOUNTER — Other Ambulatory Visit: Payer: Self-pay

## 2021-08-04 DIAGNOSIS — Z510 Encounter for antineoplastic radiation therapy: Secondary | ICD-10-CM | POA: Diagnosis not present

## 2021-08-04 LAB — RAD ONC ARIA SESSION SUMMARY
Course Elapsed Days: 37
Plan Fractions Treated to Date: 28
Plan Prescribed Dose Per Fraction: 2 Gy
Plan Total Fractions Prescribed: 40
Plan Total Prescribed Dose: 80 Gy
Reference Point Dosage Given to Date: 56 Gy
Reference Point Session Dosage Given: 2 Gy
Session Number: 28

## 2021-08-05 ENCOUNTER — Other Ambulatory Visit: Payer: Self-pay

## 2021-08-05 ENCOUNTER — Ambulatory Visit
Admission: RE | Admit: 2021-08-05 | Discharge: 2021-08-05 | Disposition: A | Discharge status: Home / Self Care | Payer: Medicare Other | Source: Ambulatory Visit | Attending: Radiation Oncology | Admitting: Radiation Oncology

## 2021-08-05 DIAGNOSIS — Z510 Encounter for antineoplastic radiation therapy: Secondary | ICD-10-CM | POA: Diagnosis not present

## 2021-08-05 LAB — RAD ONC ARIA SESSION SUMMARY
Course Elapsed Days: 38
Plan Fractions Treated to Date: 29
Plan Prescribed Dose Per Fraction: 2 Gy
Plan Total Fractions Prescribed: 40
Plan Total Prescribed Dose: 80 Gy
Reference Point Dosage Given to Date: 58 Gy
Reference Point Session Dosage Given: 2 Gy
Session Number: 29

## 2021-08-09 ENCOUNTER — Ambulatory Visit
Admission: RE | Admit: 2021-08-09 | Discharge: 2021-08-09 | Disposition: A | Discharge status: Home / Self Care | Payer: Medicare Other | Source: Ambulatory Visit | Attending: Radiation Oncology | Admitting: Radiation Oncology

## 2021-08-09 ENCOUNTER — Other Ambulatory Visit: Payer: Self-pay

## 2021-08-09 DIAGNOSIS — Z510 Encounter for antineoplastic radiation therapy: Secondary | ICD-10-CM | POA: Diagnosis not present

## 2021-08-09 LAB — RAD ONC ARIA SESSION SUMMARY
Course Elapsed Days: 42
Plan Fractions Treated to Date: 30
Plan Prescribed Dose Per Fraction: 2 Gy
Plan Total Fractions Prescribed: 40
Plan Total Prescribed Dose: 80 Gy
Reference Point Dosage Given to Date: 60 Gy
Reference Point Session Dosage Given: 2 Gy
Session Number: 30

## 2021-08-10 ENCOUNTER — Inpatient Hospital Stay: Payer: Medicare Other

## 2021-08-10 ENCOUNTER — Ambulatory Visit
Admission: RE | Admit: 2021-08-10 | Discharge: 2021-08-10 | Disposition: A | Discharge status: Home / Self Care | Payer: Medicare Other | Source: Ambulatory Visit | Attending: Radiation Oncology | Admitting: Radiation Oncology

## 2021-08-10 ENCOUNTER — Other Ambulatory Visit: Payer: Self-pay

## 2021-08-10 DIAGNOSIS — Z510 Encounter for antineoplastic radiation therapy: Secondary | ICD-10-CM | POA: Diagnosis not present

## 2021-08-10 DIAGNOSIS — C61 Malignant neoplasm of prostate: Secondary | ICD-10-CM

## 2021-08-10 LAB — RAD ONC ARIA SESSION SUMMARY
Course Elapsed Days: 43
Plan Fractions Treated to Date: 31
Plan Prescribed Dose Per Fraction: 2 Gy
Plan Total Fractions Prescribed: 40
Plan Total Prescribed Dose: 80 Gy
Reference Point Dosage Given to Date: 62 Gy
Reference Point Session Dosage Given: 2 Gy
Session Number: 31

## 2021-08-10 LAB — CBC
HCT: 40.8 % (ref 39.0–52.0)
Hemoglobin: 13.5 g/dL (ref 13.0–17.0)
MCH: 30.3 pg (ref 26.0–34.0)
MCHC: 33.1 g/dL (ref 30.0–36.0)
MCV: 91.7 fL (ref 80.0–100.0)
Platelets: 184 10*3/uL (ref 150–400)
RBC: 4.45 MIL/uL (ref 4.22–5.81)
RDW: 14.4 % (ref 11.5–15.5)
WBC: 3.9 10*3/uL — ABNORMAL LOW (ref 4.0–10.5)
nRBC: 0 % (ref 0.0–0.2)

## 2021-08-11 ENCOUNTER — Ambulatory Visit: Payer: Medicare Other

## 2021-08-11 ENCOUNTER — Ambulatory Visit: Admission: RE | Admit: 2021-08-11 | Payer: Medicare Other | Source: Ambulatory Visit

## 2021-08-12 ENCOUNTER — Other Ambulatory Visit: Payer: Self-pay

## 2021-08-12 ENCOUNTER — Ambulatory Visit
Admission: RE | Admit: 2021-08-12 | Discharge: 2021-08-12 | Disposition: A | Discharge status: Home / Self Care | Payer: Medicare Other | Source: Ambulatory Visit | Attending: Radiation Oncology | Admitting: Radiation Oncology

## 2021-08-12 DIAGNOSIS — Z510 Encounter for antineoplastic radiation therapy: Secondary | ICD-10-CM | POA: Insufficient documentation

## 2021-08-12 DIAGNOSIS — C61 Malignant neoplasm of prostate: Secondary | ICD-10-CM | POA: Diagnosis present

## 2021-08-12 LAB — RAD ONC ARIA SESSION SUMMARY
Course Elapsed Days: 45
Plan Fractions Treated to Date: 32
Plan Prescribed Dose Per Fraction: 2 Gy
Plan Total Fractions Prescribed: 40
Plan Total Prescribed Dose: 80 Gy
Reference Point Dosage Given to Date: 64 Gy
Reference Point Session Dosage Given: 2 Gy
Session Number: 32

## 2021-08-15 ENCOUNTER — Other Ambulatory Visit: Payer: Self-pay

## 2021-08-15 ENCOUNTER — Ambulatory Visit
Admission: RE | Admit: 2021-08-15 | Discharge: 2021-08-15 | Disposition: A | Discharge status: Home / Self Care | Payer: Medicare Other | Source: Ambulatory Visit | Attending: Radiation Oncology | Admitting: Radiation Oncology

## 2021-08-15 DIAGNOSIS — Z510 Encounter for antineoplastic radiation therapy: Secondary | ICD-10-CM | POA: Diagnosis not present

## 2021-08-15 LAB — RAD ONC ARIA SESSION SUMMARY
Course Elapsed Days: 48
Plan Fractions Treated to Date: 33
Plan Prescribed Dose Per Fraction: 2 Gy
Plan Total Fractions Prescribed: 40
Plan Total Prescribed Dose: 80 Gy
Reference Point Dosage Given to Date: 66 Gy
Reference Point Session Dosage Given: 2 Gy
Session Number: 33

## 2021-08-16 ENCOUNTER — Ambulatory Visit
Admission: RE | Admit: 2021-08-16 | Discharge: 2021-08-16 | Disposition: A | Discharge status: Home / Self Care | Payer: Medicare Other | Source: Ambulatory Visit | Attending: Radiation Oncology | Admitting: Radiation Oncology

## 2021-08-16 ENCOUNTER — Other Ambulatory Visit: Payer: Self-pay

## 2021-08-16 DIAGNOSIS — Z510 Encounter for antineoplastic radiation therapy: Secondary | ICD-10-CM | POA: Diagnosis not present

## 2021-08-16 LAB — RAD ONC ARIA SESSION SUMMARY
Course Elapsed Days: 49
Plan Fractions Treated to Date: 34
Plan Prescribed Dose Per Fraction: 2 Gy
Plan Total Fractions Prescribed: 40
Plan Total Prescribed Dose: 80 Gy
Reference Point Dosage Given to Date: 68 Gy
Reference Point Session Dosage Given: 2 Gy
Session Number: 34

## 2021-08-17 ENCOUNTER — Ambulatory Visit
Admission: RE | Admit: 2021-08-17 | Discharge: 2021-08-17 | Disposition: A | Discharge status: Home / Self Care | Payer: Medicare Other | Source: Ambulatory Visit | Attending: Radiation Oncology | Admitting: Radiation Oncology

## 2021-08-17 ENCOUNTER — Other Ambulatory Visit: Payer: Self-pay

## 2021-08-17 DIAGNOSIS — Z510 Encounter for antineoplastic radiation therapy: Secondary | ICD-10-CM | POA: Diagnosis not present

## 2021-08-17 LAB — RAD ONC ARIA SESSION SUMMARY
Course Elapsed Days: 50
Plan Fractions Treated to Date: 35
Plan Prescribed Dose Per Fraction: 2 Gy
Plan Total Fractions Prescribed: 40
Plan Total Prescribed Dose: 80 Gy
Reference Point Dosage Given to Date: 70 Gy
Reference Point Session Dosage Given: 2 Gy
Session Number: 35

## 2021-08-18 ENCOUNTER — Other Ambulatory Visit: Payer: Self-pay

## 2021-08-18 ENCOUNTER — Ambulatory Visit
Admission: RE | Admit: 2021-08-18 | Discharge: 2021-08-18 | Disposition: A | Discharge status: Home / Self Care | Payer: Medicare Other | Source: Ambulatory Visit | Attending: Radiation Oncology | Admitting: Radiation Oncology

## 2021-08-18 DIAGNOSIS — Z510 Encounter for antineoplastic radiation therapy: Secondary | ICD-10-CM | POA: Diagnosis not present

## 2021-08-18 LAB — RAD ONC ARIA SESSION SUMMARY
Course Elapsed Days: 51
Plan Fractions Treated to Date: 36
Plan Prescribed Dose Per Fraction: 2 Gy
Plan Total Fractions Prescribed: 40
Plan Total Prescribed Dose: 80 Gy
Reference Point Dosage Given to Date: 72 Gy
Reference Point Session Dosage Given: 2 Gy
Session Number: 36

## 2021-08-19 ENCOUNTER — Ambulatory Visit
Admission: RE | Admit: 2021-08-19 | Discharge: 2021-08-19 | Disposition: A | Discharge status: Home / Self Care | Payer: Medicare Other | Source: Ambulatory Visit | Attending: Radiation Oncology | Admitting: Radiation Oncology

## 2021-08-19 ENCOUNTER — Other Ambulatory Visit: Payer: Self-pay

## 2021-08-19 DIAGNOSIS — Z510 Encounter for antineoplastic radiation therapy: Secondary | ICD-10-CM | POA: Diagnosis not present

## 2021-08-19 LAB — RAD ONC ARIA SESSION SUMMARY
Course Elapsed Days: 52
Plan Fractions Treated to Date: 37
Plan Prescribed Dose Per Fraction: 2 Gy
Plan Total Fractions Prescribed: 40
Plan Total Prescribed Dose: 80 Gy
Reference Point Dosage Given to Date: 74 Gy
Reference Point Session Dosage Given: 2 Gy
Session Number: 37

## 2021-08-22 ENCOUNTER — Other Ambulatory Visit: Payer: Self-pay

## 2021-08-22 ENCOUNTER — Ambulatory Visit
Admission: RE | Admit: 2021-08-22 | Discharge: 2021-08-22 | Disposition: A | Discharge status: Home / Self Care | Payer: Medicare Other | Source: Ambulatory Visit | Attending: Radiation Oncology | Admitting: Radiation Oncology

## 2021-08-22 DIAGNOSIS — Z510 Encounter for antineoplastic radiation therapy: Secondary | ICD-10-CM | POA: Diagnosis not present

## 2021-08-22 LAB — RAD ONC ARIA SESSION SUMMARY
Course Elapsed Days: 55
Plan Fractions Treated to Date: 38
Plan Prescribed Dose Per Fraction: 2 Gy
Plan Total Fractions Prescribed: 40
Plan Total Prescribed Dose: 80 Gy
Reference Point Dosage Given to Date: 76 Gy
Reference Point Session Dosage Given: 2 Gy
Session Number: 38

## 2021-08-23 ENCOUNTER — Other Ambulatory Visit: Payer: Self-pay

## 2021-08-23 ENCOUNTER — Ambulatory Visit: Payer: Medicare Other

## 2021-08-23 ENCOUNTER — Ambulatory Visit
Admission: RE | Admit: 2021-08-23 | Discharge: 2021-08-23 | Disposition: A | Discharge status: Home / Self Care | Payer: Medicare Other | Source: Ambulatory Visit | Attending: Radiation Oncology | Admitting: Radiation Oncology

## 2021-08-23 DIAGNOSIS — Z510 Encounter for antineoplastic radiation therapy: Secondary | ICD-10-CM | POA: Diagnosis not present

## 2021-08-23 LAB — RAD ONC ARIA SESSION SUMMARY
Course Elapsed Days: 56
Plan Fractions Treated to Date: 39
Plan Prescribed Dose Per Fraction: 2 Gy
Plan Total Fractions Prescribed: 40
Plan Total Prescribed Dose: 80 Gy
Reference Point Dosage Given to Date: 78 Gy
Reference Point Session Dosage Given: 2 Gy
Session Number: 39

## 2021-08-24 ENCOUNTER — Ambulatory Visit
Admission: RE | Admit: 2021-08-24 | Discharge: 2021-08-24 | Disposition: A | Discharge status: Home / Self Care | Payer: Medicare Other | Source: Ambulatory Visit | Attending: Radiation Oncology | Admitting: Radiation Oncology

## 2021-08-24 ENCOUNTER — Other Ambulatory Visit: Payer: Self-pay

## 2021-08-24 DIAGNOSIS — Z510 Encounter for antineoplastic radiation therapy: Secondary | ICD-10-CM | POA: Diagnosis not present

## 2021-08-24 LAB — RAD ONC ARIA SESSION SUMMARY
Course Elapsed Days: 57
Plan Fractions Treated to Date: 40
Plan Prescribed Dose Per Fraction: 2 Gy
Plan Total Fractions Prescribed: 40
Plan Total Prescribed Dose: 80 Gy
Reference Point Dosage Given to Date: 80 Gy
Reference Point Session Dosage Given: 2 Gy
Session Number: 40

## 2021-10-03 ENCOUNTER — Ambulatory Visit
Admission: RE | Admit: 2021-10-03 | Discharge: 2021-10-03 | Disposition: A | Discharge status: Home / Self Care | Payer: Medicare Other | Source: Ambulatory Visit | Attending: Radiation Oncology | Admitting: Radiation Oncology

## 2021-10-03 VITALS — BP 136/83 | HR 81 | Temp 96.9°F | Resp 17 | Ht 69.0 in | Wt 154.4 lb

## 2021-10-03 DIAGNOSIS — C61 Malignant neoplasm of prostate: Secondary | ICD-10-CM | POA: Diagnosis present

## 2021-10-03 DIAGNOSIS — Z923 Personal history of irradiation: Secondary | ICD-10-CM | POA: Insufficient documentation

## 2021-10-03 NOTE — Progress Notes (Signed)
Radiation Oncology Follow up Note  Name: Casey Acevedo   Date:   10/03/2021 MRN:  [id number] DOB: Oct 30, 1940    This 81 y.o. male presents to the clinic today for 1 month follow-up status post IMRT radiation therapy to his prostate and pelvic nodes for stage IIIc (cT1 cN0 M0) Gleason 9 (4+5) presenting with a PSA of 14.  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  Seen today in routine follow-up he is doing well.  He specifically denies any increased lower Neri tract symptoms diarrhea or fatigue..  COMPLICATIONS OF TREATMENT: none  FOLLOW UP COMPLIANCE: keeps appointments   PHYSICAL EXAM:  BP 136/83   Pulse 81   Temp (!) 96.9 F (36.1 C)   Resp 17   Ht '5\' 9"'$  (1.753 m)   Wt 154 lb 6.4 oz (70 kg)   BMI 22.80 kg/m  Well-developed well-nourished patient in NAD. HEENT reveals PERLA, EOMI, discs not visualized.  Oral cavity is clear. No oral mucosal lesions are identified. Neck is clear without evidence of cervical or supraclavicular adenopathy. Lungs are clear to A&P. Cardiac examination is essentially unremarkable with regular rate and rhythm without murmur rub or thrill. Abdomen is benign with no organomegaly or masses noted. Motor sensory and DTR levels are equal and symmetric in the upper and lower extremities. Cranial nerves II through XII are grossly intact. Proprioception is intact. No peripheral adenopathy or edema is identified. No motor or sensory levels are noted. Crude visual fields are within normal range.  RADIOLOGY RESULTS: No current films for review  PLAN: Present time patient is doing well with extremely low side effect profile status post his IMRT radiation therapy.  And pleased with his overall progress.  Of asked to see him back in 3 months with a repeat PSA at that time.  Patient is to call  sooner with any concerns.  I would like to take this opportunity to thank you for allowing me to participate in the care of your patient.Noreene Filbert, MD

## 2021-12-02 ENCOUNTER — Encounter: Payer: Self-pay | Admitting: Oncology

## 2021-12-02 ENCOUNTER — Inpatient Hospital Stay (HOSPITAL_BASED_OUTPATIENT_CLINIC_OR_DEPARTMENT_OTHER): Payer: Medicare Other | Admitting: Oncology

## 2021-12-02 ENCOUNTER — Inpatient Hospital Stay: Payer: Medicare Other | Attending: Oncology

## 2021-12-02 VITALS — BP 136/77 | HR 79 | Temp 96.9°F | Resp 18 | Wt 154.0 lb

## 2021-12-02 DIAGNOSIS — Z8501 Personal history of malignant neoplasm of esophagus: Secondary | ICD-10-CM

## 2021-12-02 DIAGNOSIS — K449 Diaphragmatic hernia without obstruction or gangrene: Secondary | ICD-10-CM | POA: Diagnosis not present

## 2021-12-02 DIAGNOSIS — C61 Malignant neoplasm of prostate: Secondary | ICD-10-CM | POA: Insufficient documentation

## 2021-12-02 DIAGNOSIS — Z08 Encounter for follow-up examination after completed treatment for malignant neoplasm: Secondary | ICD-10-CM | POA: Diagnosis not present

## 2021-12-02 LAB — COMPREHENSIVE METABOLIC PANEL
ALT: 15 U/L (ref 0–44)
AST: 12 U/L — ABNORMAL LOW (ref 15–41)
Albumin: 3.6 g/dL (ref 3.5–5.0)
Alkaline Phosphatase: 132 U/L — ABNORMAL HIGH (ref 38–126)
Anion gap: 4 — ABNORMAL LOW (ref 5–15)
BUN: 27 mg/dL — ABNORMAL HIGH (ref 8–23)
CO2: 25 mmol/L (ref 22–32)
Calcium: 8.8 mg/dL — ABNORMAL LOW (ref 8.9–10.3)
Chloride: 111 mmol/L (ref 98–111)
Creatinine, Ser: 1.05 mg/dL (ref 0.61–1.24)
GFR, Estimated: >60 mL/min (ref >60)
Glucose, Bld: 118 mg/dL — ABNORMAL HIGH (ref 70–99)
Potassium: 3.8 mmol/L (ref 3.5–5.1)
Sodium: 140 mmol/L (ref 135–145)
Total Bilirubin: 0.4 mg/dL (ref 0.3–1.2)
Total Protein: 6.8 g/dL (ref 6.5–8.1)

## 2021-12-02 LAB — CBC WITH DIFFERENTIAL/PLATELET
Abs Immature Granulocytes: 0.02 10*3/uL (ref 0.00–0.07)
Basophils Absolute: 0.1 10*3/uL (ref 0.0–0.1)
Basophils Relative: 2 %
Eosinophils Absolute: 0.2 10*3/uL (ref 0.0–0.5)
Eosinophils Relative: 3 %
HCT: 39.2 % (ref 39.0–52.0)
Hemoglobin: 13 g/dL (ref 13.0–17.0)
Immature Granulocytes: 0 %
Lymphocytes Relative: 16 %
Lymphs Abs: 0.9 10*3/uL (ref 0.7–4.0)
MCH: 30.5 pg (ref 26.0–34.0)
MCHC: 33.2 g/dL (ref 30.0–36.0)
MCV: 92 fL (ref 80.0–100.0)
Monocytes Absolute: 0.6 10*3/uL (ref 0.1–1.0)
Monocytes Relative: 10 %
Neutro Abs: 4 10*3/uL (ref 1.7–7.7)
Neutrophils Relative %: 69 %
Platelets: 216 10*3/uL (ref 150–400)
RBC: 4.26 MIL/uL (ref 4.22–5.81)
RDW: 13.2 % (ref 11.5–15.5)
WBC: 5.8 10*3/uL (ref 4.0–10.5)
nRBC: 0 % (ref 0.0–0.2)

## 2021-12-02 LAB — PSA: Prostatic Specific Antigen: 0.05 ng/mL (ref 0.00–4.00)

## 2021-12-02 NOTE — Progress Notes (Signed)
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 [id number]    Chief complaint/ Reason for visit-routine follow-up of esophageal cancer  Heme/Onc history: 1. Patient is a 81 year-old gentleman with no significant past medical history and takes no medications he has a remote history of smokingduring his college days and quit smoking thereafter. No history of any alcohol intake. He was recently admitted to the hospital on 12/06/2016 with symptoms of progressive dysphagia which he states has been going on since the starting of September. His dysphagia got to the point that he began to have pain even on swallowing icecream. He underwent EGD at that time which showed but high 2 cm long stricture 2 cm above the GE junction. Biopsies at that time were negative for malignancy. Given that he was unable to eat he was started on TPN at that time and plan was to get a repeat EGD with possible biopsy   2. Patient underwent repeat EGD on 12/26/2016 where he was again noted to have a lower esophageal stricture which reduced the lumen to less than 3-4 mm. There was no evidence of Barrett's esophagus. 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 Shortly after the stricture was dilated the  stricture did close up significantly. Postprocedure patient complained of abdominal pain and there was a concern for perforation and hence a repeat CT abdomen was obtained which did not show any evidence of perforation   3. CT abdomen pelvis as well as CT chest with contrast on 10/01/2018showed: 7 mm sub pleural nodule in the lingula. No evidence of axillary mediastinal or hilar adenopathy.1.3 cm soft tissue nodule adjacent to the GE junctionwhich is favored to represent an enlarged lymph node potentially active or metastatic in etiology. Narrowing involving thickening of the distal esophagus compatible with known distal esophageal stricture   4. He lives with his wife and is independent of his ADLs and IADLs. Besides dysphagia patient reports no loss of appetite or unintentional weight loss over the last few months. He denies any pain   5. Patient seen by Dr. Servando Snare from cardiothoracic surgery from [place]. He has been deemed to be a potential surgical candidate in the future. Plan for now is to proceed with concurrent chemoradiation followed by EUS upon completion of treatment given difficulty with the   second EGD   6. Chemo/RT started on 01/19/17 and completed on 03/01/17   7. Patient underwent transhiatal total esophagectomy with cervical esophagogastrostomy, pyloromyotomy in April 2019. Pathology showed no residual carcinoma at primary site. 1/5 LN positive for malignancy. 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  Patient currently remains in remission    8.  Diagnosed with metastatic castrate sensitive prostate cancer in 2021 for which she follows up with Dr. Bernardo Heater and Dr. Baruch Gouty  Interval history-patient is currently doing well for his age.  Appetite and weight have remained stable.  Denies any complaints at this time.  ECOG PS- 1 Pain scale- 0   Review of systems- Review of Systems  Constitutional:  Negative for chills, fever, malaise/fatigue and weight loss.  HENT:  Negative  for congestion, ear discharge and nosebleeds.   Eyes:  Negative for blurred vision.  Respiratory:  Negative for cough, hemoptysis, sputum production, shortness of breath and wheezing.   Cardiovascular:  Negative for chest pain, palpitations, orthopnea and claudication.  Gastrointestinal:  Negative for abdominal pain, blood in stool, constipation, diarrhea, heartburn, melena, nausea and vomiting.  Genitourinary:  Negative for dysuria, flank pain, frequency, hematuria and urgency.  Musculoskeletal:  Negative for back pain, joint pain and myalgias.  Skin:  Negative for rash.  Neurological:  Negative for dizziness, tingling, focal weakness, seizures, weakness and headaches.  Endo/Heme/Allergies:  Does not bruise/bleed easily.  Psychiatric/Behavioral:  Negative for depression and suicidal ideas. The patient does not have insomnia.       Allergies  Allergen Reactions   Tramadol Other (See Comments)    Hallucination Pt states that it made crazy.     Past Medical History:  Diagnosis Date   Aortic atherosclerosis ([place])    Arthritis    Bladder tumor    Compression fracture of thoracic spine, non-traumatic ([place]) 01/28/2018   Coronary artery calcification    Dysphagia    Elevated liver enzymes    Esophageal cancer ([place]) 2018   Chemo + Rad tx's with surgical resection.    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 ([place]) 06/05/2017     Past Surgical History:  Procedure Laterality Date   CHOLECYSTECTOMY     COLONOSCOPY     COMPLETE ESOPHAGECTOMY N/A 06/18/2017   Procedure: cervical ESOPHAGECTOMY COMPLETE;  Surgeon: Grace Isaac, MD;  Location: [place];  Service: Thoracic;  Laterality: N/A;  NEED NIMS ET TUBE   ESOPHAGOGASTRODUODENOSCOPY (EGD) WITH PROPOFOL N/A 12/07/2016   Procedure: ESOPHAGOGASTRODUODENOSCOPY (EGD) WITH PROPOFOL;  Surgeon: Jonathon Bellows, MD;  Location: [facility] ENDOSCOPY;  Service: Gastroenterology;  Laterality: N/A;   ESOPHAGOGASTRODUODENOSCOPY (EGD) WITH  PROPOFOL N/A 12/26/2016   Procedure: ESOPHAGOGASTRODUODENOSCOPY (EGD) WITH PROPOFOL WITH DILATION;  Surgeon: Jonathon Bellows, MD;  Location: [facility] ENDOSCOPY;  Service: Gastroenterology;  Laterality: N/A;   EYE SURGERY Bilateral    Cataracts   GASTROJEJUNOSTOMY N/A 01/16/2017   Procedure: LAPROSCOPIC ASSIST FEEDING JEJUNOSTOMY TUBE;  Surgeon: Johnathan Hausen, MD;  Location: [facility] ORS;  Service: General;  Laterality: N/A;   IR FLUORO GUIDE PORT INSERTION RIGHT  01/10/2017   IR REMOVAL TUN ACCESS W/ PORT W/O FL MOD SED  03/22/2018   IR REPLACE G-TUBE SIMPLE WO FLUORO  03/15/2017   IR [place] DUODEN/JEJUNO TUBE PERCUT W/FLUORO  03/05/2017   KYPHOPLASTY N/A 08/31/2020   Procedure: L1 KYPHOPLASTY;  Surgeon: Hessie Knows, MD;  Location: [facility] ORS;  Service: Orthopedics;  Laterality: N/A;   KYPHOPLASTY N/A 10/05/2020   Procedure: T12 KYPHOPLASTY;  Surgeon: Hessie Knows, MD;  Location: [facility] ORS;  Service: Orthopedics;  Laterality: N/A;   PICC LINE INSERTION Right    PROSTATE BIOPSY N/A 05/03/2021   Procedure: PROSTATE BIOPSY;  Surgeon: Abbie Sons, MD;  Location: [facility] ORS;  Service: Urology;  Laterality: N/A;   PYLOROMYOTOMY N/A 06/18/2017   Procedure: Edwena Blow;  Surgeon: Grace Isaac, MD;  Location: [facility] OR;  Service: Thoracic;  Laterality: N/A;   TRANSRECTAL ULTRASOUND N/A 05/03/2021   Procedure: TRANSRECTAL ULTRASOUND;  Surgeon: Abbie Sons, MD;  Location: [facility] ORS;  Service: Urology;  Laterality: N/A;   TRANSURETHRAL RESECTION  OF BLADDER TUMOR N/A 02/15/2021   Procedure: TRANSURETHRAL RESECTION OF BLADDER TUMOR (TURBT);  Surgeon: Abbie Sons, MD;  Location: [facility] ORS;  Service: Urology;  Laterality: N/A;   TRANSURETHRAL RESECTION OF BLADDER TUMOR WITH MITOMYCIN-C N/A 01/13/2020   Procedure: TRANSURETHRAL RESECTION OF BLADDER TUMOR WITH gemcitabine;  Surgeon: Abbie Sons, MD;  Location: [facility] ORS;  Service: Urology;  Laterality: N/A;   TRANSURETHRAL RESECTION OF PROSTATE N/A  05/03/2021   Procedure: TRANSURETHRAL RESECTION OF THE PROSTATE (TURP);  Surgeon: Abbie Sons, MD;  Location: [facility] ORS;  Service: Urology;  Laterality: N/A;   VIDEO BRONCHOSCOPY N/A 06/18/2017   Procedure: VIDEO BRONCHOSCOPY;  Surgeon: Grace Isaac, MD;  Location: [facility] OR;  Service: Thoracic;  Laterality: N/A;    Social History   Socioeconomic History   Marital status: Married    Spouse name: Not on file   Number of children: Not on file   Years of education: Not on file   Highest education level: Not on file  Occupational History   Not on file  Tobacco Use   Smoking status: Never   Smokeless tobacco: Never  Vaping Use   Vaping Use: Never used  Substance and Sexual Activity   Alcohol use: No   Drug use: No   Sexual activity: Not Currently  Other Topics Concern   Not on file  Social History Narrative   Independent at baseline. Ambulatory   Social Determinants of Health   Financial Resource Strain: Not on file  Food Insecurity: Not on file  Transportation Needs: Not on file  Physical Activity: Not on file  Stress: Not on file  Social Connections: Not on file  Intimate Partner Violence: Not on file    Family History  Problem Relation Age of Onset   Heart disease Father    Diabetes Mother    Hypertension Mother    Heart attack Brother 11   Prostate cancer Neg Hx    Bladder Cancer Neg Hx    Kidney cancer Neg Hx      Current Outpatient Medications:    Acetaminophen 500 MG capsule, Take by mouth., Disp: , Rfl:    Calcium Carb-Cholecalciferol 500-10 MG-MCG TABS, Take 1 tablet by mouth daily., Disp: , Rfl:    Multiple Vitamin (MULTIVITAMIN) tablet, Take 1 tablet by mouth daily., Disp: , Rfl:    diphenoxylate-atropine (LOMOTIL) 2.5-0.025 MG tablet, Take 1 tablet by mouth 4 (four) times daily as needed for diarrhea or loose stools. (Patient not taking: Reported on 12/02/2021), Disp: 30 tablet, Rfl: 1   tamsulosin (FLOMAX) 0.4 MG CAPS capsule, Take 1 capsule (0.4  mg total) by mouth daily. (Patient not taking: Reported on 12/02/2021), Disp: 90 capsule, Rfl: 0  Physical exam:  Vitals:   12/02/21 1306  BP: 136/77  Pulse: 79  Resp: 18  Temp: (!) 96.9 F (36.1 C)  SpO2: 100%  Weight: 154 lb (69.9 kg)   Physical Exam Constitutional:      General: He is not in acute distress. Cardiovascular:     Rate and Rhythm: Normal rate and regular rhythm.     Heart sounds: Normal heart sounds.  Pulmonary:     Effort: Pulmonary effort is normal.     Breath sounds: Normal breath sounds.  Abdominal:     General: Bowel sounds are normal.     Palpations: Abdomen is soft.  Skin:    General: Skin is warm and dry.  Neurological:     Mental Status: He is alert and  oriented to person, place, and time.         Latest Ref Rng & Units 12/02/2021   12:31 PM  CMP  Glucose 70 - 99 mg/dL 118   BUN 8 - 23 mg/dL 27   Creatinine 0.61 - 1.24 mg/dL 1.05   Sodium 135 - 145 mmol/L 140   Potassium 3.5 - 5.1 mmol/L 3.8   Chloride 98 - 111 mmol/L 111   CO2 22 - 32 mmol/L 25   Calcium 8.9 - 10.3 mg/dL 8.8   Total Protein 6.5 - 8.1 g/dL 6.8   Total Bilirubin 0.3 - 1.2 mg/dL 0.4   Alkaline Phos 38 - 126 U/L 132   AST 15 - 41 U/L 12   ALT 0 - 44 U/L 15       Latest Ref Rng & Units 12/02/2021   12:31 PM  CBC  WBC 4.0 - 10.5 K/uL 5.8   Hemoglobin 13.0 - 17.0 g/dL 13.0   Hematocrit 39.0 - 52.0 % 39.2   Platelets 150 - 400 K/uL 216      Assessment and plan- Patient is a 81 y.o. 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 YpT0ypN1.  He is here for routine follow-up of esophageal cancer  Patient is now nearly 5 years out of his esophageal cancer diagnosis.  He underwent neoadjuvant concurrent chemoradiation followed by definitive surgery.  Clinically he is doing well with no concerning signs and symptoms of recurrence.  He was diagnosed with metastatic castrate sensitive prostateCancer and as a part of the work-up  had a CT abdomen and pelvis with contrast and a bone scan in March 2023 which also did not show any metastatic disease as far as esophageal cancer is concerned.  I suspect a part of the hiatal hernia there is sneezing on CT is partly due to his esophageal pull-through surgery.  He is not having any symptoms because of that.  From his prostate cancer standpoint patient can continue to follow-up with urology and radiation oncology and can be referred to [place] in the future if questions or concerns arise.  From standpoint of esophageal cancer he is now 5 years out of his diagnosis and therefore does not require any follow-up with me   Visit Diagnosis 1. Encounter for follow-up surveillance of esophageal cancer      Dr. Randa Evens, MD, MPH [facility] [phone] 12/02/2021 4:11 PM

## 2022-01-02 ENCOUNTER — Other Ambulatory Visit: Payer: Medicare Other

## 2022-01-09 ENCOUNTER — Ambulatory Visit
Admission: RE | Admit: 2022-01-09 | Discharge: 2022-01-09 | Disposition: A | Discharge status: Home / Self Care | Payer: Medicare Other | Source: Ambulatory Visit | Attending: Radiation Oncology | Admitting: Radiation Oncology

## 2022-01-09 VITALS — BP 139/76 | HR 84 | Temp 96.0°F | Resp 15 | Ht 68.5 in | Wt 155.4 lb

## 2022-01-09 DIAGNOSIS — Z923 Personal history of irradiation: Secondary | ICD-10-CM | POA: Insufficient documentation

## 2022-01-09 DIAGNOSIS — C61 Malignant neoplasm of prostate: Secondary | ICD-10-CM | POA: Diagnosis present

## 2022-01-09 NOTE — Progress Notes (Signed)
Radiation Oncology Follow up Note  Name: Casey Acevedo   Date:   01/09/2022 MRN:  [id number] DOB: [birth year]    This 81 y.o. 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  Seen today in routine follow-up he is doing well.  He specifically denies any increased lower urinary tract symptoms diarrhea or fatigue.  His most recent PSA is 0.05.  He is currently on ADT therapy..  COMPLICATIONS OF TREATMENT: none  FOLLOW UP COMPLIANCE: keeps appointments   PHYSICAL EXAM:  BP 139/76   Pulse 84   Temp (!) 96 F (35.6 C)   Resp 15   Ht 5' 8.5" (1.74 m)   Wt 155 lb 6.4 oz (70.5 kg)   BMI 23.28 kg/m  Well-developed well-nourished patient in NAD. HEENT reveals PERLA, EOMI, discs not visualized.  Oral cavity is clear. No oral mucosal lesions are identified. Neck is clear without evidence of cervical or supraclavicular adenopathy. Lungs are clear to A&P. Cardiac examination is essentially unremarkable with regular rate and rhythm without murmur rub or thrill. Abdomen is benign with no organomegaly or masses noted. Motor sensory and DTR levels are equal and symmetric in the upper and lower extremities. Cranial nerves II through XII are grossly intact. Proprioception is intact. No peripheral adenopathy or edema is identified. No motor or sensory levels are noted. Crude visual fields are within normal range.  RADIOLOGY RESULTS: No current films for review  PLAN: Present time patient is doing well under excellent biochemical control of his prostate cancer.  He continues ADT therapy through urology.  I have asked to see him back in 6 months  with a follow-up PSA.  Patient knows to call with any concerns.  I would like to take this opportunity to thank you for allowing me to participate in the care of your patient..Noreene Filbert MD

## 2022-07-12 ENCOUNTER — Inpatient Hospital Stay: Payer: Medicare Other | Attending: Oncology

## 2022-07-12 DIAGNOSIS — Z8501 Personal history of malignant neoplasm of esophagus: Secondary | ICD-10-CM | POA: Insufficient documentation

## 2022-07-12 DIAGNOSIS — N401 Enlarged prostate with lower urinary tract symptoms: Secondary | ICD-10-CM | POA: Insufficient documentation

## 2022-07-12 DIAGNOSIS — C61 Malignant neoplasm of prostate: Secondary | ICD-10-CM

## 2022-07-12 LAB — PSA: Prostatic Specific Antigen: 0.22 ng/mL (ref 0.00–4.00)

## 2022-07-17 ENCOUNTER — Encounter: Payer: Self-pay | Admitting: Radiation Oncology

## 2022-07-17 ENCOUNTER — Other Ambulatory Visit: Payer: Self-pay | Admitting: *Deleted

## 2022-07-17 ENCOUNTER — Ambulatory Visit
Admission: RE | Admit: 2022-07-17 | Discharge: 2022-07-17 | Disposition: A | Discharge status: Home / Self Care | Payer: Medicare Other | Source: Ambulatory Visit | Attending: Radiation Oncology | Admitting: Radiation Oncology

## 2022-07-17 VITALS — BP 152/82 | HR 81 | Temp 98.0°F | Resp 16 | Ht 68.0 in | Wt 159.0 lb

## 2022-07-17 DIAGNOSIS — Z923 Personal history of irradiation: Secondary | ICD-10-CM | POA: Insufficient documentation

## 2022-07-17 DIAGNOSIS — C775 Secondary and unspecified malignant neoplasm of intrapelvic lymph nodes: Secondary | ICD-10-CM | POA: Insufficient documentation

## 2022-07-17 DIAGNOSIS — Z8501 Personal history of malignant neoplasm of esophagus: Secondary | ICD-10-CM | POA: Insufficient documentation

## 2022-07-17 DIAGNOSIS — C61 Malignant neoplasm of prostate: Secondary | ICD-10-CM | POA: Diagnosis present

## 2022-07-17 NOTE — Progress Notes (Signed)
Radiation Oncology Follow up Note  Name: Casey Acevedo   Date:   07/17/2022 MRN:  [id number] DOB: [birth year]    This 82 y.o. 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  REFERRING PROVIDER: Marguarite Arbour, MD  HPI: Patient is a 82 year old male now out 10 months having completed IMRT radiation therapy to his prostate and pelvic nodes for Gleason 9 adenocarcinoma seen today in routine follow-up he is doing well.  He specifically denies any increased lower urinary tract symptoms diarrhea or fatigue.Marland Kitchen  His most recent PSA is 0.2 up slightly from 0.057 months prior.  Patient also has a history of esophageal cancer status post neoadjuvant chemo RT and esophagectomy.  He is now 5 years out from that showing no evidence of disease.  COMPLICATIONS OF TREATMENT: none  FOLLOW UP COMPLIANCE: keeps appointments   PHYSICAL EXAM:  BP (!) 152/82   Pulse 81   Temp 98 F (36.7 C) (Tympanic)   Resp 16   Ht 5\' 8"  (1.727 m)   Wt 159 lb (72.1 kg)   BMI 24.18 kg/m  Well-developed well-nourished patient in NAD. HEENT reveals PERLA, EOMI, discs not visualized.  Oral cavity is clear. No oral mucosal lesions are identified. Neck is clear without evidence of cervical or supraclavicular adenopathy. Lungs are clear to A&P. Cardiac examination is essentially unremarkable with regular rate and rhythm without murmur rub or thrill. Abdomen is benign with no organomegaly or masses noted. Motor sensory and DTR levels are equal and symmetric in the upper and lower extremities. Cranial nerves II through XII are grossly intact. Proprioception is intact. No peripheral adenopathy or edema is identified. No motor or sensory levels are noted. Crude visual fields are within normal range.  RADIOLOGY RESULTS: No current films for review  PLAN:  Present time patient is doing well with no evidence of disease under excellent biochemical control of his prostate cancer 10 months out from IMRT radiation.  And pleased with his overall progress.  Of asked to see him back in 6 months for follow-up.  Patient knows to call with any concerns.  I would like to take this opportunity to thank you for allowing me to participate in the care of your patient.Carmina Miller, MD

## 2022-08-08 ENCOUNTER — Encounter (INDEPENDENT_AMBULATORY_CARE_PROVIDER_SITE_OTHER): Payer: Medicare Other | Admitting: Ophthalmology

## 2022-08-08 DIAGNOSIS — H35371 Puckering of macula, right eye: Secondary | ICD-10-CM

## 2022-08-08 DIAGNOSIS — H43813 Vitreous degeneration, bilateral: Secondary | ICD-10-CM

## 2022-08-08 DIAGNOSIS — D3131 Benign neoplasm of right choroid: Secondary | ICD-10-CM | POA: Diagnosis not present

## 2023-01-10 ENCOUNTER — Inpatient Hospital Stay: Payer: Medicare Other | Attending: Oncology

## 2023-01-10 DIAGNOSIS — C61 Malignant neoplasm of prostate: Secondary | ICD-10-CM | POA: Diagnosis present

## 2023-01-10 LAB — PSA: Prostatic Specific Antigen: 10.9 ng/mL — ABNORMAL HIGH (ref 0.00–4.00)

## 2023-01-17 ENCOUNTER — Ambulatory Visit
Admission: RE | Admit: 2023-01-17 | Discharge: 2023-01-17 | Disposition: A | Discharge status: Home / Self Care | Payer: Medicare Other | Source: Ambulatory Visit | Attending: Radiation Oncology | Admitting: Radiation Oncology

## 2023-01-17 ENCOUNTER — Encounter: Payer: Self-pay | Admitting: Radiation Oncology

## 2023-01-17 VITALS — BP 136/82 | HR 81 | Temp 98.2°F | Resp 12 | Wt 162.1 lb

## 2023-01-17 DIAGNOSIS — C61 Malignant neoplasm of prostate: Secondary | ICD-10-CM

## 2023-01-17 NOTE — Progress Notes (Signed)
Radiation Oncology Follow up Note  Name: Casey Acevedo   Date:   01/17/2023 MRN:  [id number] DOB: 1940-08-26    This 82 y.o. male presents to the clinic today for 41-month follow-up.  Status post IMRT radiation therapy to his prostate and pelvic nodes for stage IIIc (cT1 cN0 M0) Gleason 9 (4+5) adenocarcinoma presenting with a PSA of 14.  REFERRING PROVIDER: Marguarite Arbour, MD  HPI: Patient is an 82 year old male now out 16 months having completed IMRT radiation therapy to his prostate and pelvic nodes for stage IIIc Gleason 9 adenocarcinoma the prostate presenting with a PSA of 14.Marland Kitchen  His PSA originally had declined to 0.05 was up to 0.226 months ago and most recently was 10.9.  He is having no significant lower urinary tract symptoms diarrhea or fatigue.  He is having no bone pain.  Patient does see in follow-up Dr. Smith Robert for distant history of esophageal carcinoma in complete remission  COMPLICATIONS OF TREATMENT: none  FOLLOW UP COMPLIANCE: keeps appointments   PHYSICAL EXAM:  BP 136/82   Pulse 81   Temp 98.2 F (36.8 C) (Tympanic)   Resp 12   Wt 162 lb 1.6 oz (73.5 kg)   BMI 24.65 kg/m  Thin elderly male in NAD.  Well-developed well-nourished patient in NAD. HEENT reveals PERLA, EOMI, discs not visualized.  Oral cavity is clear. No oral mucosal lesions are identified. Neck is clear without evidence of cervical or supraclavicular adenopathy. Lungs are clear to A&P. Cardiac examination is essentially unremarkable with regular rate and rhythm without murmur rub or thrill. Abdomen is benign with no organomegaly or masses noted. Motor sensory and DTR levels are equal and symmetric in the upper and lower extremities. Cranial nerves II through XII are grossly intact. Proprioception is intact. No peripheral adenopathy or edema is identified. No motor or sensory levels are noted. Crude visual fields are within normal range.  RADIOLOGY RESULTS: No current films for review  PLAN: At this  time based on his jump in his PSA to over 10 I am referring him back to Dr. Smith Robert for consideration of ADT therapy.  I will I have set him up for follow-up with me in 6 months.  I briefly discussed risks and benefits of ADT therapy although that will be can be emphasized by medical oncology.  Patient knows to call with any concerns.  I would like to take this opportunity to thank you for allowing me to participate in the care of your patient.Carmina Miller, MD

## 2023-01-23 ENCOUNTER — Other Ambulatory Visit: Payer: Self-pay | Admitting: *Deleted

## 2023-01-23 ENCOUNTER — Inpatient Hospital Stay: Payer: Medicare Other | Attending: Oncology | Admitting: Oncology

## 2023-01-23 ENCOUNTER — Encounter: Payer: Self-pay | Admitting: Oncology

## 2023-01-23 VITALS — BP 136/83 | HR 83 | Temp 96.5°F | Resp 19 | Wt 163.2 lb

## 2023-01-23 DIAGNOSIS — C7951 Secondary malignant neoplasm of bone: Secondary | ICD-10-CM | POA: Insufficient documentation

## 2023-01-23 DIAGNOSIS — Z08 Encounter for follow-up examination after completed treatment for malignant neoplasm: Secondary | ICD-10-CM

## 2023-01-23 DIAGNOSIS — Z8501 Personal history of malignant neoplasm of esophagus: Secondary | ICD-10-CM

## 2023-01-23 DIAGNOSIS — C61 Malignant neoplasm of prostate: Secondary | ICD-10-CM | POA: Diagnosis not present

## 2023-01-24 NOTE — Progress Notes (Signed)
Hematology/Oncology Consult note [facility]  [phone] [phone]) [phone]  Patient Care Team: Marguarite Arbour, MD as PCP - General (Internal Medicine) End, Cristal Deer, MD as PCP - Cardiology (Cardiology) Benita Gutter, RN as Registered Nurse Delight Ovens, MD (Inactive) as Consulting Physician (Cardiothoracic Surgery) Creig Hines, MD as Consulting Physician (Oncology) Carmina Miller, MD as Referring Physician (Radiation Oncology)   Name of the patient: Casey Acevedo  [id number]  [birth year]   Date of visit: 01/24/23  Diagnosis-history of esophageal cancer in remission-  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 Pathology showed no residual carcinoma at primary site. 1/5 LN positive for malignancy. 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  Patient currently remains in remission   Patient was diagnosed with high-grade prostate cancer in 2021.  Initial PSA was 14.  It was a stage IIIc adenocarcinoma with a Gleason's 9.  He received IMRT radiation therapy following which his PSA declined to 0.05.  He received 1 dose of Lupron in April 2023 but none after that.  His PSA was 0.05 a year ago and then went up to 0.226 months ago and is presently at 10.9   Interval history-patient is doing well presently and denies any changes in his appetite or weight.  Denies any new aches and pains anywhere.  ECOG PS- 0 Pain scale- 0   Review of systems- Review of Systems   Constitutional:  Negative for chills, fever, malaise/fatigue and weight loss.  HENT:  Negative for congestion, ear discharge and nosebleeds.   Eyes:  Negative for blurred vision.  Respiratory:  Negative for cough, hemoptysis, sputum production, shortness of breath and wheezing.   Cardiovascular:  Negative for chest pain, palpitations, orthopnea and claudication.  Gastrointestinal:  Negative for abdominal pain, blood in stool, constipation, diarrhea, heartburn, melena, nausea and vomiting.  Genitourinary:  Negative for dysuria, flank pain, frequency, hematuria and urgency.  Musculoskeletal:  Negative for back pain, joint pain and myalgias.  Skin:  Negative for rash.  Neurological:  Negative for dizziness, tingling, focal weakness, seizures, weakness and headaches.  Endo/Heme/Allergies:  Does not bruise/bleed easily.  Psychiatric/Behavioral:  Negative for depression and suicidal ideas. The patient does not have insomnia.       Allergies  Allergen Reactions   Tramadol Other (See Comments)    Hallucination Pt states that it made crazy.     Past Medical History:  Diagnosis Date   Aortic atherosclerosis (HCC)    Arthritis    Bladder tumor    Compression fracture of thoracic spine, non-traumatic (HCC) 01/28/2018   Coronary artery calcification    Dysphagia    Elevated liver enzymes    Esophageal cancer (HCC) 2018   Chemo + Rad tx's with surgical resection.    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 (HCC) 06/05/2017     Past Surgical History:  Procedure Laterality Date   CHOLECYSTECTOMY     COLONOSCOPY     COMPLETE ESOPHAGECTOMY N/A 06/18/2017   Procedure: cervical ESOPHAGECTOMY COMPLETE;  Surgeon: Delight Ovens, MD;  Location: [facility] OR;  Service: Thoracic;  Laterality: N/A;  NEED NIMS ET TUBE  ESOPHAGOGASTRODUODENOSCOPY (EGD) WITH PROPOFOL N/A 12/07/2016   Procedure: ESOPHAGOGASTRODUODENOSCOPY (EGD) WITH PROPOFOL;  Surgeon: Wyline Mood, MD;  Location: [facility]  ENDOSCOPY;  Service: Gastroenterology;  Laterality: N/A;   ESOPHAGOGASTRODUODENOSCOPY (EGD) WITH PROPOFOL N/A 12/26/2016   Procedure: ESOPHAGOGASTRODUODENOSCOPY (EGD) WITH PROPOFOL WITH DILATION;  Surgeon: Wyline Mood, MD;  Location: [facility] ENDOSCOPY;  Service: Gastroenterology;  Laterality: N/A;   EYE SURGERY Bilateral    Cataracts   GASTROJEJUNOSTOMY N/A 01/16/2017   Procedure: LAPROSCOPIC ASSIST FEEDING JEJUNOSTOMY TUBE;  Surgeon: Luretha Murphy, MD;  Location: [facility] ORS;  Service: General;  Laterality: N/A;   IR FLUORO GUIDE PORT INSERTION RIGHT  01/10/2017   IR REMOVAL TUN ACCESS W/ PORT W/O FL MOD SED  03/22/2018   IR REPLACE G-TUBE SIMPLE WO FLUORO  03/15/2017   IR REPLC DUODEN/JEJUNO TUBE PERCUT W/FLUORO  03/05/2017   KYPHOPLASTY N/A 08/31/2020   Procedure: L1 KYPHOPLASTY;  Surgeon: Kennedy Bucker, MD;  Location: [facility] ORS;  Service: Orthopedics;  Laterality: N/A;   KYPHOPLASTY N/A 10/05/2020   Procedure: T12 KYPHOPLASTY;  Surgeon: Kennedy Bucker, MD;  Location: [facility] ORS;  Service: Orthopedics;  Laterality: N/A;   PICC LINE INSERTION Right    PROSTATE BIOPSY N/A 05/03/2021   Procedure: PROSTATE BIOPSY;  Surgeon: Riki Altes, MD;  Location: [facility] ORS;  Service: Urology;  Laterality: N/A;   PYLOROMYOTOMY N/A 06/18/2017   Procedure: Manning Charity;  Surgeon: Delight Ovens, MD;  Location: [facility] OR;  Service: Thoracic;  Laterality: N/A;   TRANSRECTAL ULTRASOUND N/A 05/03/2021   Procedure: TRANSRECTAL ULTRASOUND;  Surgeon: Riki Altes, MD;  Location: [facility] ORS;  Service: Urology;  Laterality: N/A;   TRANSURETHRAL RESECTION OF BLADDER TUMOR N/A 02/15/2021   Procedure: TRANSURETHRAL RESECTION OF BLADDER TUMOR (TURBT);  Surgeon: Riki Altes, MD;  Location: [facility] ORS;  Service: Urology;  Laterality: N/A;   TRANSURETHRAL RESECTION OF BLADDER TUMOR WITH MITOMYCIN-C N/A 01/13/2020   Procedure: TRANSURETHRAL RESECTION OF BLADDER TUMOR WITH gemcitabine;  Surgeon: Riki Altes, MD;   Location: [facility] ORS;  Service: Urology;  Laterality: N/A;   TRANSURETHRAL RESECTION OF PROSTATE N/A 05/03/2021   Procedure: TRANSURETHRAL RESECTION OF THE PROSTATE (TURP);  Surgeon: Riki Altes, MD;  Location: [facility] ORS;  Service: Urology;  Laterality: N/A;   VIDEO BRONCHOSCOPY N/A 06/18/2017   Procedure: VIDEO BRONCHOSCOPY;  Surgeon: Delight Ovens, MD;  Location: [facility] OR;  Service: Thoracic;  Laterality: N/A;    Social History   Socioeconomic History   Marital status: Married    Spouse name: Not on file   Number of children: Not on file   Years of education: Not on file   Highest education level: Not on file  Occupational History   Not on file  Tobacco Use   Smoking status: Never   Smokeless tobacco: Never  Vaping Use   Vaping status: Never Used  Substance and Sexual Activity   Alcohol use: No   Drug use: No   Sexual activity: Not Currently  Other Topics Concern   Not on file  Social History Narrative   Independent at baseline. Ambulatory   Social Determinants of Health   Financial Resource Strain: Low Risk  (12/05/2022)   Received from [facility]   Overall Financial Resource Strain (CARDIA)    Difficulty of Paying Living Expenses: Not hard at all  Food Insecurity: No Food Insecurity (12/05/2022)   Received from [facility]   Hunger Vital Sign    Worried About Running Out of Food in the Last  Year: Never true    Ran Out of Food in the Last Year: Never true  Transportation Needs: No Transportation Needs (12/05/2022)   Received [facility] - Transportation    In the past 12 months, has lack of transportation kept you from medical appointments or from getting medications?: No    Lack of Transportation (Non-Medical): No  Physical Activity: Not on file  Stress: Not on file  Social Connections: Not on file  Intimate Partner Violence: Not on file    Family History  Problem Relation Age of Onset    Heart disease Father    Diabetes Mother    Hypertension Mother    Heart attack Brother 41   Prostate cancer Neg Hx    Bladder Cancer Neg Hx    Kidney cancer Neg Hx      Current Outpatient Medications:    Acetaminophen 500 MG capsule, Take by mouth., Disp: , Rfl:    Calcium Carb-Cholecalciferol 500-10 MG-MCG TABS, Take 1 tablet by mouth daily., Disp: , Rfl:    Multiple Vitamin (MULTIVITAMIN) tablet, Take 1 tablet by mouth daily., Disp: , Rfl:   Physical exam:  Vitals:   01/23/23 1306  BP: 136/83  Pulse: 83  Resp: 19  Temp: (!) 96.5 F (35.8 C)  SpO2: 98%  Weight: 163 lb 3.2 oz (74 kg)   Physical Exam Cardiovascular:     Rate and Rhythm: Normal rate and regular rhythm.     Heart sounds: Normal heart sounds.  Pulmonary:     Effort: Pulmonary effort is normal.     Breath sounds: Normal breath sounds.  Skin:    General: Skin is warm and dry.  Neurological:     Mental Status: He is alert and oriented to person, place, and time.         Latest Ref Rng & Units 12/02/2021   12:31 PM  CMP  Glucose 70 - 99 mg/dL 098   BUN 8 - 23 mg/dL 27   Creatinine 1.19 - 1.24 mg/dL 1.47   Sodium 829 - 562 mmol/L 140   Potassium 3.5 - 5.1 mmol/L 3.8   Chloride 98 - 111 mmol/L 111   CO2 22 - 32 mmol/L 25   Calcium 8.9 - 10.3 mg/dL 8.8   Total Protein 6.5 - 8.1 g/dL 6.8   Total Bilirubin 0.3 - 1.2 mg/dL 0.4   Alkaline Phos 38 - 126 U/L 132   AST 15 - 41 U/L 12   ALT 0 - 44 U/L 15       Latest Ref Rng & Units 12/02/2021   12:31 PM  CBC  WBC 4.0 - 10.5 K/uL 5.8   Hemoglobin 13.0 - 17.0 g/dL 13.0   Hematocrit 86.5 - 52.0 % 39.2   Platelets 150 - 400 K/uL 216     Assessment and plan- Patient is a 82 y.o. male referred for prostate cancer and rising PSA  Patient was treated for stage IIIc prostate cancer 2 years ago and received IMRT to his pelvis.  He received 1 dose of ADT in April 2023 but none since then.  CT abdomen and pelvis with contrast in March 2023 did not show any  evidence of metastatic disease.  Presently his PSA has increased from 0.22 to 10.9.  I have previously seen him for esophageal cancer but his prostate cancer care was being managed by urology and radiation oncology.  I discussed with the patient the differences between metastatic and nonmetastatic  as well as castrate sensitive and castrate resistant prostate cancer.  Patient needs to be restarted on ADT at this time but I would like to first get CT chest abdomen pelvis with contrast and bone scan.  I will see the patient following the CT scan to discuss further management   Visit Diagnosis 1. Encounter for follow-up surveillance of esophageal cancer   2. Prostate cancer [facility])      Dr. Owens Shark, MD, MPH [facility] [phone] 01/24/2023 9:38 AM

## 2023-01-30 ENCOUNTER — Other Ambulatory Visit: Payer: Self-pay | Admitting: Oncology

## 2023-01-30 ENCOUNTER — Encounter: Payer: Self-pay | Admitting: Oncology

## 2023-02-01 ENCOUNTER — Encounter: Payer: Self-pay | Admitting: Oncology

## 2023-02-02 ENCOUNTER — Other Ambulatory Visit: Payer: Self-pay | Admitting: Oncology

## 2023-02-02 ENCOUNTER — Ambulatory Visit
Admission: RE | Admit: 2023-02-02 | Discharge: 2023-02-02 | Disposition: A | Discharge status: Home / Self Care | Payer: Medicare Other | Source: Ambulatory Visit | Attending: Oncology | Admitting: Oncology

## 2023-02-02 ENCOUNTER — Encounter
Admission: RE | Admit: 2023-02-02 | Discharge: 2023-02-02 | Disposition: A | Discharge status: Home / Self Care | Payer: Medicare Other | Source: Ambulatory Visit | Attending: Oncology | Admitting: Oncology

## 2023-02-02 ENCOUNTER — Telehealth: Payer: Self-pay | Admitting: *Deleted

## 2023-02-02 DIAGNOSIS — Z08 Encounter for follow-up examination after completed treatment for malignant neoplasm: Secondary | ICD-10-CM | POA: Diagnosis present

## 2023-02-02 DIAGNOSIS — Z8501 Personal history of malignant neoplasm of esophagus: Secondary | ICD-10-CM | POA: Diagnosis present

## 2023-02-02 DIAGNOSIS — C61 Malignant neoplasm of prostate: Secondary | ICD-10-CM

## 2023-02-02 MED ORDER — TECHNETIUM TC 99M MEDRONATE IV KIT
20.0000 | PACK | Freq: Once | INTRAVENOUS | Status: AC | PRN
Start: 2023-02-02 — End: 2023-02-02
  Administered 2023-02-02: 20.93 via INTRAVENOUS

## 2023-02-02 MED ORDER — IOHEXOL 300 MG/ML  SOLN
100.0000 mL | Freq: Once | INTRAMUSCULAR | Status: AC | PRN
  Administered 2023-02-02: 100 mL via INTRAVENOUS

## 2023-02-02 NOTE — Telephone Encounter (Signed)
Called to tell DR. That she needs to look at the scan results. I printed the scan and gave it to [employer]. She said she already looked at the results

## 2023-02-04 ENCOUNTER — Encounter: Payer: Self-pay | Admitting: Oncology

## 2023-02-06 ENCOUNTER — Encounter (INDEPENDENT_AMBULATORY_CARE_PROVIDER_SITE_OTHER): Payer: Medicare Other | Admitting: Ophthalmology

## 2023-02-06 DIAGNOSIS — D3131 Benign neoplasm of right choroid: Secondary | ICD-10-CM | POA: Diagnosis not present

## 2023-02-06 DIAGNOSIS — H43813 Vitreous degeneration, bilateral: Secondary | ICD-10-CM

## 2023-02-06 DIAGNOSIS — H35373 Puckering of macula, bilateral: Secondary | ICD-10-CM | POA: Diagnosis not present

## 2023-02-07 ENCOUNTER — Inpatient Hospital Stay: Payer: Medicare Other | Admitting: Oncology

## 2023-02-07 ENCOUNTER — Other Ambulatory Visit (HOSPITAL_COMMUNITY): Payer: Self-pay

## 2023-02-07 ENCOUNTER — Inpatient Hospital Stay (HOSPITAL_BASED_OUTPATIENT_CLINIC_OR_DEPARTMENT_OTHER): Payer: Medicare Other | Admitting: Oncology

## 2023-02-07 ENCOUNTER — Encounter: Payer: Self-pay | Admitting: Oncology

## 2023-02-07 ENCOUNTER — Inpatient Hospital Stay: Payer: Medicare Other

## 2023-02-07 ENCOUNTER — Ambulatory Visit: Payer: Medicare Other

## 2023-02-07 ENCOUNTER — Telehealth: Payer: Self-pay

## 2023-02-07 ENCOUNTER — Ambulatory Visit: Payer: Medicare Other | Admitting: Oncology

## 2023-02-07 VITALS — BP 141/76 | HR 87 | Temp 97.6°F | Resp 18 | Wt 162.7 lb

## 2023-02-07 DIAGNOSIS — Z7189 Other specified counseling: Secondary | ICD-10-CM | POA: Diagnosis not present

## 2023-02-07 DIAGNOSIS — C61 Malignant neoplasm of prostate: Secondary | ICD-10-CM

## 2023-02-07 DIAGNOSIS — C7951 Secondary malignant neoplasm of bone: Secondary | ICD-10-CM | POA: Diagnosis not present

## 2023-02-07 MED ORDER — DEGARELIX ACETATE(240 MG DOSE) 120 MG/VIAL ~~LOC~~ SOLR
240.0000 mg | Freq: Once | SUBCUTANEOUS | Status: AC
Start: 2023-02-07 — End: 2023-02-07
  Administered 2023-02-07: 240 mg via SUBCUTANEOUS
  Filled 2023-02-07: qty 6

## 2023-02-07 NOTE — Telephone Encounter (Signed)
Oral Oncology Patient Advocate Encounter  New authorization   Received notification that prior authorization for Abiraterone is required.   PA submitted on 02/07/23  Key [id number]  Status is pending     Ardeen Fillers, [name] Oncology Pharmacy Patient Advocate  [facility]  [phone] (phone) [phone] (fax) 02/07/2023 3:59 PM

## 2023-02-12 ENCOUNTER — Encounter: Payer: Self-pay | Admitting: Oncology

## 2023-02-12 ENCOUNTER — Other Ambulatory Visit: Payer: Self-pay | Admitting: *Deleted

## 2023-02-12 ENCOUNTER — Other Ambulatory Visit: Payer: Self-pay

## 2023-02-12 ENCOUNTER — Other Ambulatory Visit (HOSPITAL_COMMUNITY): Payer: Self-pay

## 2023-02-12 DIAGNOSIS — C61 Malignant neoplasm of prostate: Secondary | ICD-10-CM

## 2023-02-12 DIAGNOSIS — Z08 Encounter for follow-up examination after completed treatment for malignant neoplasm: Secondary | ICD-10-CM

## 2023-02-12 MED ORDER — ABIRATERONE ACETATE 250 MG PO TABS: 1000.0000 mg | ORAL_TABLET | Freq: Every day | ORAL | Status: DC

## 2023-02-12 NOTE — Progress Notes (Signed)
Irish Lack, MD sent to [facility] PROCEDURE / BIOPSY REVIEW Date: 02/12/23  Requested Biopsy site: Retroperitoneal LN, left para-aortic Reason for request: Metastatic disease Imaging review: Best seen on CT  Decision: Approved Imaging modality to perform: Ultrasound and CT Schedule with: Moderate Sedation Schedule for: Any VIR  Additional comments:   Please contact me with questions, concerns, or if issue pertaining to this request arise.  Reola Calkins, MD [facility]

## 2023-02-12 NOTE — Telephone Encounter (Signed)
Oral Oncology Patient Advocate Encounter  Prior Authorization for Abiraterone has been approved.    PA# [id number]  Effective dates: 02/12/23 through 03/12/24  Patients co-pay is $142.61.    Ardeen Fillers, [name] Oncology Pharmacy Patient Advocate  [facility]  [phone] (phone) [phone] (fax) 02/12/2023 11:20 AM

## 2023-02-12 NOTE — Progress Notes (Signed)
Hematology/Oncology Consult note [facility]  [phone] [phone]  Patient Care Team: Marguarite Arbour, MD as PCP - General (Internal Medicine) End, Cristal Deer, MD as PCP - Cardiology (Cardiology) Benita Gutter, RN as Registered Nurse Delight Ovens, MD (Inactive) as Consulting Physician (Cardiothoracic Surgery) Creig Hines, MD as Consulting Physician (Oncology) Carmina Miller, MD as Referring Physician (Radiation Oncology)   Name of the patient: Casey Acevedo  [id number]  02-02-1941   Date of visit: 02/12/23  Diagnosis-metastatic castrate sensitive prostate cancer  Chief complaint/ Reason for visit-discuss CT scan results and further management  Heme/Onc history:  Patient is a 82 year old male 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 Pathology showed no residual carcinoma at primary site. 1/5 LN positive for malignancy. 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  Patient currently remains in remission    Patient was diagnosed with high-grade prostate cancer in 2021.  Initial PSA was 14.  It was a stage IIIc adenocarcinoma with a Gleason's 9.  He received IMRT radiation therapy following which his PSA declined to 0.05.  He received 1 dose of Lupron in April 2023 but none after that.  His PSA was 0.05 a year ago and then went up to 0.226 months ago and is presently at 10.9. CT chest abdomen and pelvis with contrast showed multifocal sclerotic osseous foci along with enlarged retroperitoneal and hiatal lymph nodes consistent with malignancy.  Bone scan also showed abnormal radiotracer uptake involving calvarium spine ribs right clavicle and pelvic girdle.  Interval history-overall he is doing well and denies  any complaints at this time.  Denies any back pain.  Appetite and weight have remained stable  ECOG PS- 1 Pain scale- 0   Review of systems- Review of Systems  Constitutional:  Negative for chills, fever, malaise/fatigue and weight loss.  HENT:  Negative for congestion, ear discharge and nosebleeds.   Eyes:  Negative for blurred vision.  Respiratory:  Negative for cough, hemoptysis, sputum production, shortness of breath and wheezing.   Cardiovascular:  Negative for chest pain, palpitations, orthopnea and claudication.  Gastrointestinal:  Negative for abdominal pain, blood in stool, constipation, diarrhea, heartburn, melena, nausea and vomiting.  Genitourinary:  Negative for dysuria, flank pain, frequency, hematuria and urgency.  Musculoskeletal:  Negative for back pain, joint pain and myalgias.  Skin:  Negative for rash.  Neurological:  Negative for dizziness, tingling, focal weakness, seizures, weakness and headaches.  Endo/Heme/Allergies:  Does not bruise/bleed easily.  Psychiatric/Behavioral:  Negative for depression and suicidal ideas. The patient does not have insomnia.       Allergies  Allergen Reactions   Tramadol Other (See Comments)    Hallucination Pt states that it made crazy.     Past Medical History:  Diagnosis Date   Aortic atherosclerosis (HCC)    Arthritis    Bladder tumor    Compression fracture of thoracic spine, non-traumatic (HCC) 01/28/2018   Coronary artery calcification    Dysphagia    Elevated liver enzymes    Esophageal cancer (HCC) 2018   Chemo + Rad tx's with surgical resection.    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 (HCC) 06/05/2017     Past Surgical History:  Procedure Laterality Date   CHOLECYSTECTOMY     COLONOSCOPY     COMPLETE ESOPHAGECTOMY N/A 06/18/2017   Procedure:  cervical ESOPHAGECTOMY COMPLETE;  Surgeon: Delight Ovens, MD;  Location: [facility] OR;  Service: Thoracic;  Laterality: N/A;  NEED NIMS ET TUBE    ESOPHAGOGASTRODUODENOSCOPY (EGD) WITH PROPOFOL N/A 12/07/2016   Procedure: ESOPHAGOGASTRODUODENOSCOPY (EGD) WITH PROPOFOL;  Surgeon: Wyline Mood, MD;  Location: [facility] ENDOSCOPY;  Service: Gastroenterology;  Laterality: N/A;   ESOPHAGOGASTRODUODENOSCOPY (EGD) WITH PROPOFOL N/A 12/26/2016   Procedure: ESOPHAGOGASTRODUODENOSCOPY (EGD) WITH PROPOFOL WITH DILATION;  Surgeon: Wyline Mood, MD;  Location: [facility] ENDOSCOPY;  Service: Gastroenterology;  Laterality: N/A;   EYE SURGERY Bilateral    Cataracts   GASTROJEJUNOSTOMY N/A 01/16/2017   Procedure: LAPROSCOPIC ASSIST FEEDING JEJUNOSTOMY TUBE;  Surgeon: Luretha Murphy, MD;  Location: [facility] ORS;  Service: General;  Laterality: N/A;   IR FLUORO GUIDE PORT INSERTION RIGHT  01/10/2017   IR REMOVAL TUN ACCESS W/ PORT W/O FL MOD SED  03/22/2018   IR REPLACE G-TUBE SIMPLE WO FLUORO  03/15/2017   IR REPLC DUODEN/JEJUNO TUBE PERCUT W/FLUORO  03/05/2017   KYPHOPLASTY N/A 08/31/2020   Procedure: L1 KYPHOPLASTY;  Surgeon: Kennedy Bucker, MD;  Location: [facility] ORS;  Service: Orthopedics;  Laterality: N/A;   KYPHOPLASTY N/A 10/05/2020   Procedure: T12 KYPHOPLASTY;  Surgeon: Kennedy Bucker, MD;  Location: [facility] ORS;  Service: Orthopedics;  Laterality: N/A;   PICC LINE INSERTION Right    PROSTATE BIOPSY N/A 05/03/2021   Procedure: PROSTATE BIOPSY;  Surgeon: Riki Altes, MD;  Location: [facility] ORS;  Service: Urology;  Laterality: N/A;   PYLOROMYOTOMY N/A 06/18/2017   Procedure: Manning Charity;  Surgeon: Delight Ovens, MD;  Location: [facility] OR;  Service: Thoracic;  Laterality: N/A;   TRANSRECTAL ULTRASOUND N/A 05/03/2021   Procedure: TRANSRECTAL ULTRASOUND;  Surgeon: Riki Altes, MD;  Location: [facility] ORS;  Service: Urology;  Laterality: N/A;   TRANSURETHRAL RESECTION OF BLADDER TUMOR N/A 02/15/2021   Procedure: TRANSURETHRAL RESECTION OF BLADDER TUMOR (TURBT);  Surgeon: Riki Altes, MD;  Location: [facility] ORS;  Service: Urology;  Laterality: N/A;   TRANSURETHRAL  RESECTION OF BLADDER TUMOR WITH MITOMYCIN-C N/A 01/13/2020   Procedure: TRANSURETHRAL RESECTION OF BLADDER TUMOR WITH gemcitabine;  Surgeon: Riki Altes, MD;  Location: [facility] ORS;  Service: Urology;  Laterality: N/A;   TRANSURETHRAL RESECTION OF PROSTATE N/A 05/03/2021   Procedure: TRANSURETHRAL RESECTION OF THE PROSTATE (TURP);  Surgeon: Riki Altes, MD;  Location: [facility] ORS;  Service: Urology;  Laterality: N/A;   VIDEO BRONCHOSCOPY N/A 06/18/2017   Procedure: VIDEO BRONCHOSCOPY;  Surgeon: Delight Ovens, MD;  Location: [facility] OR;  Service: Thoracic;  Laterality: N/A;    Social History   Socioeconomic History   Marital status: Married    Spouse name: Not on file   Number of children: Not on file   Years of education: Not on file   Highest education level: Not on file  Occupational History   Not on file  Tobacco Use   Smoking status: Never   Smokeless tobacco: Never  Vaping Use   Vaping status: Never Used  Substance and Sexual Activity   Alcohol use: No   Drug use: No   Sexual activity: Not Currently  Other Topics Concern   Not on file  Social History Narrative   Independent at baseline. Ambulatory   Social Determinants of Health   Financial Resource Strain: Low Risk  (12/05/2022)   Received from [facility]   Overall Financial Resource Strain (CARDIA)    Difficulty of Paying Living Expenses: Not hard at all  Food Insecurity: No Food Insecurity (  12/05/2022)   Received from [facility]   Hunger Vital Sign    Worried About Running Out of Food in the Last Year: Never true    Ran Out of Food in the Last Year: Never true  Transportation Needs: No Transportation Needs (12/05/2022)   Received from [facility] - Transportation    In the past 12 months, has lack of transportation kept you from medical appointments or from getting medications?: No    Lack of Transportation (Non-Medical): No  Physical Activity:  Not on file  Stress: Not on file  Social Connections: Not on file  Intimate Partner Violence: Not on file    Family History  Problem Relation Age of Onset   Heart disease Father    Diabetes Mother    Hypertension Mother    Heart attack Brother 43   Prostate cancer Neg Hx    Bladder Cancer Neg Hx    Kidney cancer Neg Hx      Current Outpatient Medications:    Acetaminophen 500 MG capsule, Take by mouth., Disp: , Rfl:    Calcium Carb-Cholecalciferol 500-10 MG-MCG TABS, Take 1 tablet by mouth daily., Disp: , Rfl:    Multiple Vitamin (MULTIVITAMIN) tablet, Take 1 tablet by mouth daily., Disp: , Rfl:   Physical exam:  Vitals:   02/07/23 1314  BP: (!) 141/76  Pulse: 87  Resp: 18  Temp: 97.6 F (36.4 C)  TempSrc: Tympanic  SpO2: 99%  Weight: 162 lb 11.2 oz (73.8 kg)   Physical Exam Cardiovascular:     Rate and Rhythm: Normal rate and regular rhythm.     Heart sounds: Normal heart sounds.  Pulmonary:     Effort: Pulmonary effort is normal.     Breath sounds: Normal breath sounds.  Abdominal:     General: Bowel sounds are normal.     Palpations: Abdomen is soft.  Skin:    General: Skin is warm and dry.  Neurological:     Mental Status: He is alert and oriented to person, place, and time.         Latest Ref Rng & Units 12/02/2021   12:31 PM  CMP  Glucose 70 - 99 mg/dL 161   BUN 8 - 23 mg/dL 27   Creatinine 0.96 - 1.24 mg/dL 0.45   Sodium 409 - 811 mmol/L 140   Potassium 3.5 - 5.1 mmol/L 3.8   Chloride 98 - 111 mmol/L 111   CO2 22 - 32 mmol/L 25   Calcium 8.9 - 10.3 mg/dL 8.8   Total Protein 6.5 - 8.1 g/dL 6.8   Total Bilirubin 0.3 - 1.2 mg/dL 0.4   Alkaline Phos 38 - 126 U/L 132   AST 15 - 41 U/L 12   ALT 0 - 44 U/L 15       Latest Ref Rng & Units 12/02/2021   12:31 PM  CBC  WBC 4.0 - 10.5 K/uL 5.8   Hemoglobin 13.0 - 17.0 g/dL 91.4   Hematocrit 78.2 - 52.0 % 39.2   Platelets 150 - 400 K/uL 216     No images are attached to the encounter.  NM Bone  Scan Whole Body  Result Date: 02/02/2023 CLINICAL DATA:  Esophageal and prostate cancer, follow-up/staging. EXAM: NUCLEAR MEDICINE WHOLE BODY BONE SCAN TECHNIQUE: Whole body anterior and posterior images were obtained approximately 3 hours after intravenous injection of radiopharmaceutical. RADIOPHARMACEUTICALS:  20.93 mCi Technetium-92m MDP IV COMPARISON:  Same day CT and  nuclear medicine bone scan May 17, 2021 FINDINGS: New multifocal areas of abnormal radiotracer uptake involving the calvarium, spine, ribs, right clavicle and pelvic girdle. Uptake to suggest osseous metastatic disease. Multifocal radiotracer uptake in a pattern most consistent with degenerative arthropathy. Otherwise physiologic distribution of radiotracer activity. IMPRESSION: New multifocal osteoblastic metastatic disease. Electronically Signed   By: Maudry Mayhew M.D.   On: 02/02/2023 16:16   CT CHEST ABDOMEN PELVIS W CONTRAST  Result Date: 02/02/2023 CLINICAL DATA:  History of prostate cancer and esophageal cancer cancer, follow-up. * Tracking Code: BO * EXAM: CT CHEST, ABDOMEN, AND PELVIS WITH CONTRAST TECHNIQUE: Multidetector CT imaging of the chest, abdomen and pelvis was performed following the standard protocol during bolus administration of intravenous contrast. RADIATION DOSE REDUCTION: This exam was performed according to the departmental dose-optimization program which includes automated exposure control, adjustment of the mA and/or kV according to patient size and/or use of iterative reconstruction technique. CONTRAST:  OMNIPAQUE IOHEXOL 300 MG/ML  SOLN COMPARISON:  Multiple priors including CT Jul 24, 2021 and nuclear medicine bone scan February 02, 2023 FINDINGS: CT CHEST FINDINGS Cardiovascular: Aortic atherosclerosis. No central pulmonary embolus on this nondedicated study. Three-vessel coronary artery calcifications. Normal size heart. Mediastinum/Nodes: Prior esophagectomy with gastric pull-through anatomy. New  small low mediastinal lymph node at the level of the hiatus measuring 8 mm in short axis on image 54/3. No suspicious thyroid nodule. Lungs/Pleura: Stable 7 mm right upper lobe pulmonary nodule on image 76/5. No suspicious pulmonary nodules or masses. Bibasilar predominant subpleural reticulations. Mild diffuse bronchial wall thickening. Musculoskeletal: New multifocal sclerotic osseous foci for instance involving the posterior right tenth and ninth ribs on image 114/5 and 109/5 respectively in the T3 vertebral body on image 87/7 this corresponds with avid radiotracer activity on same day bone scan. CT ABDOMEN PELVIS FINDINGS Hepatobiliary: Stable cyst in the dome of the liver on image 51/3. No suspicious hepatic lesion. Gallbladder surgically absent. No biliary ductal dilation. Pancreas: No pancreatic ductal dilation or evidence of acute inflammation. Spleen: Calcified splenic granulomata. Adrenals/Urinary Tract: No suspicious adrenal mass. Nonobstructive bilateral renal stones. No hydronephrosis. Urinary bladder is unremarkable for degree of distension. Stomach/Bowel: Gastric pull-through anatomy. No pathologic dilation of small or large bowel. Colonic diverticulosis. Vascular/Lymphatic: Enlarged retroperitoneal lymph nodes for instance a left periaortic lymph node measuring 2.3 cm in short axis on image 73/3. Aortic atherosclerosis. The portal, splenic and superior mesenteric veins are patent. Reproductive: Brachytherapy seeds in the prostate gland. Other: Mild mesenteric stranding. Musculoskeletal: Multifocal sclerotic and mixed lesions for instance involving the right pubis on image 118/3 in the left iliac bone near the SI joint on image 68/3, these demonstrate increased radiotracer uptake on same day nuclear medicine bone scan. Similar T11-L1 vertebral body compression deformities and vertebral body augmentations. IMPRESSION: 1. New multifocal sclerotic osseous foci compatible with osseous metastatic disease,  favor prostate primary but would suggest more definitive assessment via PSMA PET-CT. 2. New enlarged retroperitoneal/hiatal lymph nodes, compatible with nodal disease involvement suggest attention on follow-up PET-CT for more definitive assessment of the etiology. 3. Prior esophagectomy with gastric pull-through anatomy. 4. Nonobstructive bilateral renal stones. 5. Colonic diverticulosis. 6.  Aortic Atherosclerosis (ICD10-I70.0). Electronically Signed   By: Maudry Mayhew M.D.   On: 02/02/2023 16:14     Assessment and plan- Patient is a 82 y.o. male with history of esophageal cancer s/p neoadjuvant chemoradiation and definitive surgery which is in remission since 2018.  He is here for further discussion of castrate sensitive metastatic  prostate cancer  Patient had high risk prostate cancer given that he had a Gleason score of 9 when he was diagnosed in 2021 stage IIIc.  His PSA has now increased to 10 and CT scans show worsening retroperitoneal adenopathy and bone metastases consistent with metastatic prostate cancer.  I will see if he can biopsy the retroperitoneal lymph nodes as well to confirm that this is metastatic prostate cancer but clinically this would be consistent with the same.  He will proceed with dose 1 of Firmagon today.  We will continue with monthly Firmagon for 2-3 doses before I switch him to Lupron.  He has high risk disease but does not have high-volume disease given the absence of visceral metastases and no evidence of bone metastases outside the axial skeleton.  We discussed various options for prostate cancer including ADT plus oral antiandrogen agents versus ADT plus docetaxel versus ADT plus docetaxel plus oral antiandrogen agents.  Patient wishes to avoid chemotherapy at this time but understands that it would need to be utilized down the line if he does not respond to ADT plus oral antiandrogen agents.  I recommend proceeding with Zytiga plus Firmagon at this time.  Discussed  acetaminophens and Zytiga including all but not limited to nausea vomiting low blood counts hypertension leg edema and abnormal LFTs.  Patient understands and agrees to proceed as planned.  Treatment will be given with a palliative intent.  We will send off [facility] testing on his prostate cancer specimen as well.  I will see him back in 4 weeks with CBC with differential CMP and PSA when he comes for dose 2 of firmagon.  I will also refer the patient for genetic counseling   Cancer Staging  Prostate cancer [facility]) Staging form: Prostate, AJCC 8th Edition - Clinical stage from 02/07/2023: Stage IVB (cT3, cN1, cM1b, PSA: 10, Grade Group: 5) - Signed by Creig Hines, MD on 02/12/2023 Histopathologic type: Adenocarcinoma, NOS Prostate specific antigen (PSA) range: 10 to 19 Histologic grading system: 5 grade system     Visit Diagnosis 1. Goals of care, counseling/discussion   2. Prostate cancer [facility])      Dr. Owens Shark, MD, MPH [facility] [phone] 02/12/2023 8:59 AM

## 2023-02-13 ENCOUNTER — Telehealth: Payer: Self-pay | Admitting: Pharmacist

## 2023-02-13 ENCOUNTER — Telehealth: Payer: Self-pay

## 2023-02-13 ENCOUNTER — Other Ambulatory Visit (HOSPITAL_COMMUNITY): Payer: Self-pay

## 2023-02-13 DIAGNOSIS — C61 Malignant neoplasm of prostate: Secondary | ICD-10-CM

## 2023-02-13 NOTE — Telephone Encounter (Signed)
Clinical Pharmacist Practitioner Encounter   Received new prescription for Erleada (apulatamide) for the treatment of metastatic castration sensitive prostate cancer in conjunction with ADT, planned duration until disease progression or unacceptable drug toxicity.  Prescription dose and frequency assessed.   Current medication list in Epic reviewed, no DDIs with apalutamide identified.   Evaluated chart and no patient barriers to medication adherence identified.   Prescription has been e-scribed to the [facility] for benefits analysis and approval.  Oral Oncology Clinic will continue to follow for insurance authorization, copayment issues, initial counseling and start date.   Remi Haggard, PharmD, BCPS, BCOP, CPP Hematology/Oncology Clinical Pharmacist Practitioner [facility] [phone]  02/13/2023 12:53 PM

## 2023-02-13 NOTE — Telephone Encounter (Signed)
Called and spoke to patient regarding cost of Abiraterone. Patient indicated they could not afford the co-pay insurance was leaving them with and since there is no Estate manager/land agent for Abiraterone and there is no open funding for grants for prostate, MD has authorized change to [place]. See additional encounter.    Ardeen Fillers, [name] Oncology Pharmacy Patient Advocate  [facility]  [phone] (phone) [phone] (fax) 02/13/2023 11:48 AM

## 2023-02-13 NOTE — Telephone Encounter (Signed)
Oral Oncology Patient Advocate Encounter  New authorization   Received notification that prior authorization for Erleada is required.   PA submitted on 02/13/23  Key [id number]  Status is pending     Ardeen Fillers, [name] Oncology Pharmacy Patient Advocate  [facility]  [phone] (phone) [phone] (fax) 02/13/2023 11:49 AM

## 2023-02-13 NOTE — Telephone Encounter (Signed)
Oral Oncology Patient Advocate Encounter  Prior Authorization for Lavone Neri has been approved.    PA# [id number]  Effective dates: 02/13/23 through 03/12/24  Patients co-pay is $2,128.40.   PAP initiated. See additional encounter.    Ardeen Fillers, [name] Oncology Pharmacy Patient Advocate  [facility]  [phone] (phone) [phone] (fax) 02/13/2023 12:51 PM

## 2023-02-14 ENCOUNTER — Telehealth: Payer: Self-pay

## 2023-02-14 NOTE — Telephone Encounter (Signed)
Oral Oncology Patient Advocate Encounter  Reached out and spoke with patient regarding PAP paperwork, explained that I would send it to their preferred email via DocuSign.   Confirmed email address: [email or web address]    Patient expressed understanding and consent.  Will follow up once paperwork has been signed and returned.   Ardeen Fillers, [name] Oncology Pharmacy Patient Advocate  [facility]  [phone] (phone) [phone] (fax) 02/14/2023 9:10 AM

## 2023-02-14 NOTE — Telephone Encounter (Signed)
Oral Oncology Patient Advocate Encounter   Submitted application for assistance for Erleada to [facility] & [facility] Patient [facility].   Application submitted via e-fax to [phone]   [employer] phone number [phone].   I will continue to check the status until final determination.    Ardeen Fillers, [name] Oncology Pharmacy Patient Advocate  [facility]  [phone] (phone) [phone] (fax) 02/14/2023 11:08 AM

## 2023-02-14 NOTE — Telephone Encounter (Signed)
I called the pt. This evening about a biopsy. I talked to Fayrene Fearing about biopsy and dr. Smith Robert did say something about biopsy. He was given information but he did not know enough to see why to get it. I spoke to Smith Robert and said that since he had scan and it showed that they can see enlarged retroperitoneal lymph nodes and rao wanted to get the biopsy and the first is that he had esophagus cancer and he had prostate cancer and the psa is going up and Dr. Smith Robert thinks it 90 % that it will be prostate cancer and was already given starting injection of firmagon and oral pills erleada. But there is a chance that the esophageal and it would be better to have it proved with pathology to clear that up. Also if it is prostate cancer then we can use the biopsy  specimen and send it to [facility] so that if there are any tx are actionable it can be given if the current tx does not work or stop working. Pt. Is ok to go ahead with the biopsy. I discussed that the biopsy appt is scheduled for 12/11 arrival at 7:30 and he will go to the front of [facility] and look for heart and vascular and he has been there before. He must not eat or drink for 8 hours prior to the procedure time. He has to have a driver to go home. He understands all the above.

## 2023-02-14 NOTE — Telephone Encounter (Signed)
Oral Oncology Patient Advocate Encounter   Began application for assistance for Erleada through [facility] Patient [facility].   Application will be submitted upon completion of necessary supporting documentation.   [employer] phone number [phone].   I will continue to check the status until final determination.    Ardeen Fillers, [name] Oncology Pharmacy Patient Advocate  [facility]  [phone] (phone) [phone] (fax) 02/14/2023 9:09 AM

## 2023-02-16 NOTE — Telephone Encounter (Signed)
Received notification via fax requesting Prior Authorization Approval Letter since patient's insurance would not disclose information to them. I have faxed that over to [facility] Patient Assistance Program at [phone]. I will continue to follow and update until final determination.    Ardeen Fillers, [name] Oncology Pharmacy Patient Advocate  [facility]  [phone] (phone) [phone] (fax) 02/16/2023 8:42 AM

## 2023-02-19 NOTE — Addendum Note (Signed)
Encounter addended by: Loyal Gambler, RT on: 02/19/2023 3:00 PM  Actions taken: Imaging Exam ended

## 2023-02-19 NOTE — Telephone Encounter (Signed)
Clinical Pharmacist Practitioner Encounter   Patient has been approved form mfg assistance and is pending medication delivery set-up.   Patient Education I spoke with patient for overview of new oral chemotherapy medication: Erleada (apulatamide) for the treatment of metastatic castration sensitive prostate cancer in conjunction with ADT, planned duration until disease progression or unacceptable drug toxicity.  Counseled patient on administration, dosing, side effects, monitoring, drug-food interactions, safe handling, storage, and disposal. Patient will take 4 tablet (240 mg) by mouth daily.  Side effects include but not limited to: rash/itchy, fatigue, decreased wbc/hgb.    Reviewed with patient importance of keeping a medication schedule and plan for any missed doses.  After discussion with patient no patient barriers to medication adherence identified.   Casey Acevedo voiced understanding and appreciation. All questions answered. Medication handout provided.  Provided patient with Oral Chemotherapy Navigation Clinic phone number. Patient knows to call the office with questions or concerns. Oral Chemotherapy Navigation Clinic will continue to follow.  Remi Haggard, PharmD, BCPS, BCOP, CPP Hematology/Oncology Clinical Pharmacist Practitioner [facility] [phone]  02/19/2023 10:09 AM

## 2023-02-19 NOTE — Addendum Note (Signed)
Encounter addended by: Loyal Gambler, RT on: 02/19/2023 2:53 PM  Actions taken: Imaging Exam ended

## 2023-02-19 NOTE — Telephone Encounter (Signed)
Oral Oncology Patient Advocate Encounter   Received notification that the application for assistance for Erleada through [facility] has been approved.   [employer] phone number [phone].   Effective dates: 02/17/23 through 03/12/24  Medication will be filled at [facility].   Ardeen Fillers, [name] Oncology Pharmacy Patient Advocate  [facility]  [phone] (phone) [phone] (fax) 02/19/2023 9:44 AM

## 2023-02-19 NOTE — Telephone Encounter (Signed)
Called and spoke to patient regarding approval to [employer] Patient [facility]. Patient knows to expect a call from [employer] to set up initial fill and delivery of Erleada this week. Patient knows to call [employer] at [phone] if they have not heard from them by Wednesday, 02/21/23. Patient also knows to call me at [phone] with any questions or concerns regarding receiving medication from PAP Program.    Ardeen Fillers, [name] Oncology Pharmacy Patient Advocate  [facility]  [phone] (phone) [phone] (fax) 02/19/2023 10:16 AM

## 2023-02-20 ENCOUNTER — Other Ambulatory Visit (HOSPITAL_COMMUNITY): Payer: Self-pay | Admitting: Radiology

## 2023-02-20 DIAGNOSIS — C61 Malignant neoplasm of prostate: Secondary | ICD-10-CM

## 2023-02-20 NOTE — Progress Notes (Signed)
Patient for CT guided LT Para-aortic retroperitoneal LN Biopsy on Wed 02/21/2023, I called and spoke with the patient on the phone and gave pre-procedure instructions. Pt was made aware to be here at 7:30a, NPO after MN prior to procedure as well as driver post procedure/recovery/discharge. Pt stated understanding.  Called 02/20/2023

## 2023-02-21 ENCOUNTER — Ambulatory Visit
Admission: RE | Admit: 2023-02-21 | Discharge: 2023-02-21 | Disposition: A | Discharge status: Home / Self Care | Payer: Medicare Other | Source: Ambulatory Visit | Attending: Oncology | Admitting: Oncology

## 2023-02-21 ENCOUNTER — Other Ambulatory Visit: Payer: Self-pay | Admitting: Oncology

## 2023-02-21 ENCOUNTER — Other Ambulatory Visit: Payer: Self-pay

## 2023-02-21 DIAGNOSIS — K5730 Diverticulosis of large intestine without perforation or abscess without bleeding: Secondary | ICD-10-CM | POA: Insufficient documentation

## 2023-02-21 DIAGNOSIS — R590 Localized enlarged lymph nodes: Secondary | ICD-10-CM | POA: Diagnosis not present

## 2023-02-21 DIAGNOSIS — C61 Malignant neoplasm of prostate: Secondary | ICD-10-CM

## 2023-02-21 DIAGNOSIS — I2510 Atherosclerotic heart disease of native coronary artery without angina pectoris: Secondary | ICD-10-CM | POA: Diagnosis not present

## 2023-02-21 DIAGNOSIS — Z8501 Personal history of malignant neoplasm of esophagus: Secondary | ICD-10-CM | POA: Diagnosis not present

## 2023-02-21 LAB — PROTIME-INR
INR: 1 (ref 0.8–1.2)
Prothrombin Time: 13.4 s (ref 11.4–15.2)

## 2023-02-21 LAB — CBC
HCT: 42.9 % (ref 39.0–52.0)
Hemoglobin: 14.8 g/dL (ref 13.0–17.0)
MCH: 31.7 pg (ref 26.0–34.0)
MCHC: 34.5 g/dL (ref 30.0–36.0)
MCV: 91.9 fL (ref 80.0–100.0)
Platelets: 198 10*3/uL (ref 150–400)
RBC: 4.67 MIL/uL (ref 4.22–5.81)
RDW: 13.3 % (ref 11.5–15.5)
WBC: 4.8 10*3/uL (ref 4.0–10.5)
nRBC: 0 % (ref 0.0–0.2)

## 2023-02-21 MED ORDER — FENTANYL CITRATE (PF) 100 MCG/2ML IJ SOLN
INTRAMUSCULAR | Status: AC
  Filled 2023-02-21: qty 2

## 2023-02-21 MED ORDER — MIDAZOLAM HCL 2 MG/2ML IJ SOLN
INTRAMUSCULAR | Status: AC
  Filled 2023-02-21: qty 2

## 2023-02-21 MED ORDER — SODIUM CHLORIDE 0.9 % IV SOLN: INTRAVENOUS | Status: DC

## 2023-02-21 NOTE — Progress Notes (Signed)
Patient taken to CT Radiology for procedure.  Pre-procedure CT images done and reviewed by Dr. Deanne Coffer (see MD note for details).  Dr. Deanne Coffer spoke directly with patient.  Procedure cancelled and patient taken back to [facility].

## 2023-02-21 NOTE — H&P (Signed)
Chief Complaint: Patient was seen in consultation today for retroperitoneal lymphadenoapthy  Referring Physician(s): Rao,Archana C  Supervising Physician: Oley Balm  Patient Status: [facility] - Out-pt  History of Present Illness: Casey Acevedo is a 82 y.o. male with history of CAD, LFTs, esophageal cancer diagnosed and treated in 2018 now in remission who was diagnosed with high-grade prostate cancer in 2021.  Recent imaging showed possible metastatic disease to the bone with retroperitoneal lymphadenopathy.  IR consulted for lymph node biopsy.  Case reviewed and approved by Dr. Fredia Sorrow for left para-aortic lymph node biopsy.    Patient presents to [facility] today for lymph node biopsy.  He complains of increased back pain today.  He is concerned about his ability to tolerate the procedure today but is agreeable to procedure with sedation medication in hopes of diagnostic study.  He has been NPO.  He does not take blood thinners.  His wife is available for post-procedure care today.   Past Medical History:  Diagnosis Date   Aortic atherosclerosis (HCC)    Arthritis    Bladder tumor    Compression fracture of thoracic spine, non-traumatic (HCC) 01/28/2018   Coronary artery calcification    Dysphagia    Elevated liver enzymes    Esophageal cancer (HCC) 2018   Chemo + Rad tx's with surgical resection.    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 (HCC) 06/05/2017    Past Surgical History:  Procedure Laterality Date   CHOLECYSTECTOMY     COLONOSCOPY     COMPLETE ESOPHAGECTOMY N/A 06/18/2017   Procedure: cervical ESOPHAGECTOMY COMPLETE;  Surgeon: Delight Ovens, MD;  Location: [facility] OR;  Service: Thoracic;  Laterality: N/A;  NEED NIMS ET TUBE   ESOPHAGOGASTRODUODENOSCOPY (EGD) WITH PROPOFOL N/A 12/07/2016   Procedure: ESOPHAGOGASTRODUODENOSCOPY (EGD) WITH PROPOFOL;  Surgeon: Wyline Mood, MD;  Location[facility] ENDOSCOPY;  Service: Gastroenterology;  Laterality:  N/A;   ESOPHAGOGASTRODUODENOSCOPY (EGD) WITH PROPOFOL N/A 12/26/2016   Procedure: ESOPHAGOGASTRODUODENOSCOPY (EGD) WITH PROPOFOL WITH DILATION;  Surgeon: Wyline Mood, MD;  Location: [facility] ENDOSCOPY;  Service: Gastroenterology;  Laterality: N/A;   EYE SURGERY Bilateral    Cataracts   GASTROJEJUNOSTOMY N/A 01/16/2017   Procedure: LAPROSCOPIC ASSIST FEEDING JEJUNOSTOMY TUBE;  Surgeon: Luretha Murphy, MD;  Location: [facility] ORS;  Service: General;  Laterality: N/A;   IR FLUORO GUIDE PORT INSERTION RIGHT  01/10/2017   IR REMOVAL TUN ACCESS W/ PORT W/O FL MOD SED  03/22/2018   IR REPLACE G-TUBE SIMPLE WO FLUORO  03/15/2017   IR REPLC DUODEN/JEJUNO TUBE PERCUT W/FLUORO  03/05/2017   KYPHOPLASTY N/A 08/31/2020   Procedure: L1 KYPHOPLASTY;  Surgeon: Kennedy Bucker, MD;  Location: [facility] ORS;  Service: Orthopedics;  Laterality: N/A;   KYPHOPLASTY N/A 10/05/2020   Procedure: T12 KYPHOPLASTY;  Surgeon: Kennedy Bucker, MD;  Location: [facility] ORS;  Service: Orthopedics;  Laterality: N/A;   PICC LINE INSERTION Right    PROSTATE BIOPSY N/A 05/03/2021   Procedure: PROSTATE BIOPSY;  Surgeon: Riki Altes, MD;  Location: [facility] ORS;  Service: Urology;  Laterality: N/A;   PYLOROMYOTOMY N/A 06/18/2017   Procedure: Manning Charity;  Surgeon: Delight Ovens, MD;  Location: [facility] OR;  Service: Thoracic;  Laterality: N/A;   TRANSRECTAL ULTRASOUND N/A 05/03/2021   Procedure: TRANSRECTAL ULTRASOUND;  Surgeon: Riki Altes, MD;  Location: [facility] ORS;  Service: Urology;  Laterality: N/A;   TRANSURETHRAL RESECTION OF BLADDER TUMOR N/A 02/15/2021   Procedure: TRANSURETHRAL RESECTION OF BLADDER TUMOR (TURBT);  Surgeon: Riki Altes, MD;  Location: [facility] ORS;  Service: Urology;  Laterality: N/A;   TRANSURETHRAL RESECTION OF BLADDER TUMOR WITH MITOMYCIN-C N/A 01/13/2020   Procedure: TRANSURETHRAL RESECTION OF BLADDER TUMOR WITH gemcitabine;  Surgeon: Riki Altes, MD;  Location: [facility] ORS;  Service: Urology;  Laterality: N/A;    TRANSURETHRAL RESECTION OF PROSTATE N/A 05/03/2021   Procedure: TRANSURETHRAL RESECTION OF THE PROSTATE (TURP);  Surgeon: Riki Altes, MD;  Location: [facility] ORS;  Service: Urology;  Laterality: N/A;   VIDEO BRONCHOSCOPY N/A 06/18/2017   Procedure: VIDEO BRONCHOSCOPY;  Surgeon: Delight Ovens, MD;  Location: [facility] OR;  Service: Thoracic;  Laterality: N/A;    Allergies: Tramadol  Medications: Prior to Admission medications   Medication Sig Start Date End Date Taking? Authorizing Provider  Acetaminophen 500 MG capsule Take by mouth.   Yes [provider]  Calcium Carb-Cholecalciferol 500-10 MG-MCG TABS Take 1 tablet by mouth daily.   Yes [provider]  Multiple Vitamin (MULTIVITAMIN) tablet Take 1 tablet by mouth daily.   Yes [provider]  apalutamide (ERLEADA) 60 MG tablet Take 240 mg by mouth daily.    Creig Hines, MD     Family History  Problem Relation Age of Onset   Heart disease Father    Diabetes Mother    Hypertension Mother    Heart attack Brother 67   Prostate cancer Neg Hx    Bladder Cancer Neg Hx    Kidney cancer Neg Hx     Social History   Socioeconomic History   Marital status: Married    Spouse name: Not on file   Number of children: Not on file   Years of education: Not on file   Highest education level: Not on file  Occupational History   Not on file  Tobacco Use   Smoking status: Never   Smokeless tobacco: Never  Vaping Use   Vaping status: Never Used  Substance and Sexual Activity   Alcohol use: No   Drug use: No   Sexual activity: Not Currently  Other Topics Concern   Not on file  Social History Narrative   Independent at baseline. Ambulatory   Social Determinants of Health   Financial Resource Strain: Low Risk  (12/05/2022)   Received from [facility]   Overall Financial Resource Strain (CARDIA)    Difficulty of Paying Living Expenses: Not hard at all  Food Insecurity: No Food  Insecurity (12/05/2022)   Received from [facility]   Hunger Vital Sign    Worried About Running Out of Food in the Last Year: Never true    Ran Out of Food in the Last Year: Never true  Transportation Needs: No Transportation Needs (12/05/2022)   Received from [facility] - Transportation    In the past 12 months, has lack of transportation kept you from medical appointments or from getting medications?: No    Lack of Transportation (Non-Medical): No  Physical Activity: Not on file  Stress: Not on file  Social Connections: Not on file     Review of Systems: A 12 point ROS discussed and pertinent positives are indicated in the HPI above.  All other systems are negative.  Review of Systems  Constitutional:  Negative for fatigue and fever.  Respiratory:  Negative for cough and shortness of breath.   Cardiovascular:  Negative for chest pain.  Gastrointestinal:  Negative for abdominal pain, nausea and vomiting.  Musculoskeletal:  Negative for back pain.  Psychiatric/Behavioral:  Negative for behavioral problems and confusion.     Vital Signs: BP (!) 150/94   Pulse 78   Temp 97.8 F (36.6 C) (Oral)   Ht 5\' 8"  (1.727 m)   Wt 162 lb 8 oz (73.7 kg)   SpO2 98%   BMI 24.71 kg/m   Physical Exam Vitals and nursing note reviewed.  Constitutional:      General: He is not in acute distress.    Appearance: Normal appearance. He is not ill-appearing.  HENT:     Mouth/Throat:     Mouth: Mucous membranes are moist.     Pharynx: Oropharynx is clear.  Cardiovascular:     Rate and Rhythm: Normal rate and regular rhythm.  Pulmonary:     Effort: Pulmonary effort is normal.     Breath sounds: Normal breath sounds.  Abdominal:     General: Abdomen is flat. There is no distension.     Palpations: Abdomen is soft.  Skin:    General: Skin is warm and dry.  Neurological:     General: No focal deficit present.     Mental Status: He is alert and  oriented to person, place, and time. Mental status is at baseline.  Psychiatric:        Mood and Affect: Mood normal.        Behavior: Behavior normal.        Thought Content: Thought content normal.        Judgment: Judgment normal.      MD Evaluation Airway: WNL Heart: WNL Abdomen: WNL Chest/ Lungs: WNL ASA  Classification: 3 Mallampati/Airway Score: Two   Imaging: NM Bone Scan Whole Body  Result Date: 02/02/2023 CLINICAL DATA:  Esophageal and prostate cancer, follow-up/staging. EXAM: NUCLEAR MEDICINE WHOLE BODY BONE SCAN TECHNIQUE: Whole body anterior and posterior images were obtained approximately 3 hours after intravenous injection of radiopharmaceutical. RADIOPHARMACEUTICALS:  20.93 mCi Technetium-15m MDP IV COMPARISON:  Same day CT and nuclear medicine bone scan May 17, 2021 FINDINGS: New multifocal areas of abnormal radiotracer uptake involving the calvarium, spine, ribs, right clavicle and pelvic girdle. Uptake to suggest osseous metastatic disease. Multifocal radiotracer uptake in a pattern most consistent with degenerative arthropathy. Otherwise physiologic distribution of radiotracer activity. IMPRESSION: New multifocal osteoblastic metastatic disease. Electronically Signed   By: Maudry Mayhew M.D.   On: 02/02/2023 16:16   CT CHEST ABDOMEN PELVIS W CONTRAST  Result Date: 02/02/2023 CLINICAL DATA:  History of prostate cancer and esophageal cancer cancer, follow-up. * Tracking Code: BO * EXAM: CT CHEST, ABDOMEN, AND PELVIS WITH CONTRAST TECHNIQUE: Multidetector CT imaging of the chest, abdomen and pelvis was performed following the standard protocol during bolus administration of intravenous contrast. RADIATION DOSE REDUCTION: This exam was performed according to the departmental dose-optimization program which includes automated exposure control, adjustment of the mA and/or kV according to patient size and/or use of iterative reconstruction technique. CONTRAST:   OMNIPAQUE IOHEXOL 300 MG/ML  SOLN COMPARISON:  Multiple priors including CT Jul 24, 2021 and nuclear medicine bone scan February 02, 2023 FINDINGS: CT CHEST FINDINGS Cardiovascular: Aortic atherosclerosis. No central pulmonary embolus on this nondedicated study. Three-vessel coronary artery calcifications. Normal size heart. Mediastinum/Nodes: Prior esophagectomy with gastric pull-through anatomy. New small low mediastinal lymph node at the level of the hiatus measuring 8 mm in short axis on image 54/3. No suspicious thyroid nodule. Lungs/Pleura: Stable 7 mm right upper lobe pulmonary nodule on image 76/5. No suspicious pulmonary nodules or masses. Bibasilar predominant subpleural  reticulations. Mild diffuse bronchial wall thickening. Musculoskeletal: New multifocal sclerotic osseous foci for instance involving the posterior right tenth and ninth ribs on image 114/5 and 109/5 respectively in the T3 vertebral body on image 87/7 this corresponds with avid radiotracer activity on same day bone scan. CT ABDOMEN PELVIS FINDINGS Hepatobiliary: Stable cyst in the dome of the liver on image 51/3. No suspicious hepatic lesion. Gallbladder surgically absent. No biliary ductal dilation. Pancreas: No pancreatic ductal dilation or evidence of acute inflammation. Spleen: Calcified splenic granulomata. Adrenals/Urinary Tract: No suspicious adrenal mass. Nonobstructive bilateral renal stones. No hydronephrosis. Urinary bladder is unremarkable for degree of distension. Stomach/Bowel: Gastric pull-through anatomy. No pathologic dilation of small or large bowel. Colonic diverticulosis. Vascular/Lymphatic: Enlarged retroperitoneal lymph nodes for instance a left periaortic lymph node measuring 2.3 cm in short axis on image 73/3. Aortic atherosclerosis. The portal, splenic and superior mesenteric veins are patent. Reproductive: Brachytherapy seeds in the prostate gland. Other: Mild mesenteric stranding. Musculoskeletal: Multifocal  sclerotic and mixed lesions for instance involving the right pubis on image 118/3 in the left iliac bone near the SI joint on image 68/3, these demonstrate increased radiotracer uptake on same day nuclear medicine bone scan. Similar T11-L1 vertebral body compression deformities and vertebral body augmentations. IMPRESSION: 1. New multifocal sclerotic osseous foci compatible with osseous metastatic disease, favor prostate primary but would suggest more definitive assessment via PSMA PET-CT. 2. New enlarged retroperitoneal/hiatal lymph nodes, compatible with nodal disease involvement suggest attention on follow-up PET-CT for more definitive assessment of the etiology. 3. Prior esophagectomy with gastric pull-through anatomy. 4. Nonobstructive bilateral renal stones. 5. Colonic diverticulosis. 6.  Aortic Atherosclerosis (ICD10-I70.0). Electronically Signed   By: Maudry Mayhew M.D.   On: 02/02/2023 16:14    Labs:  CBC: Recent Labs    02/21/23 0814  WBC 4.8  HGB 14.8  HCT 42.9  PLT 198    COAGS: No results for input(s): "INR", "APTT" in the last 8760 hours.  BMP: No results for input(s): "NA", "K", "CL", "CO2", "GLUCOSE", "BUN", "CALCIUM", "CREATININE", "GFRNONAA", "GFRAA" in the last 8760 hours.  Invalid input(s): "CMP"  LIVER FUNCTION TESTS: No results for input(s): "BILITOT", "AST", "ALT", "ALKPHOS", "PROT", "ALBUMIN" in the last 8760 hours.  TUMOR MARKERS: No results for input(s): "AFPTM", "CEA", "CA199", "CHROMGRNA" in the last 8760 hours.  Assessment and Plan: Patient with past medical history of prostate cancer presents with complaint of retroperitoneal lymphadenopathy.  IR consulted for lymph node biopsy at the request of Dr. Smith Robert. Case reviewed by Dr. Fredia Sorrow who approves patient for procedure.  Patient presents today in their usual state of health.  He has been NPO and is not currently on blood thinners.   Risks and benefits of biopsy was discussed with the patient and/or  patient's family including, but not limited to bleeding, infection, damage to adjacent structures or low yield requiring additional tests.  All of the questions were answered and there is agreement to proceed.  Consent signed and in chart.  Advance Care Plan: The advanced care plan/surrogate decision maker was discussed at the time of visit and documented in the medical record.     Thank you for this interesting consult.  I greatly enjoyed meeting Casey Acevedo and look forward to participating in their care.  A copy of this report was sent to the requesting provider on this date.  Electronically Signed: Hoyt Koch, PA 02/21/2023, 8:31 AM   I spent a total of  30 Minutes   in face to  face in clinical consultation, greater than 50% of which was counseling/coordinating care for retroperitoneal lymphadenopathy.

## 2023-02-27 ENCOUNTER — Other Ambulatory Visit: Payer: Self-pay

## 2023-02-27 ENCOUNTER — Inpatient Hospital Stay: Payer: Medicare Other | Attending: Oncology | Admitting: Oncology

## 2023-02-27 ENCOUNTER — Encounter: Payer: Self-pay | Admitting: Oncology

## 2023-02-27 VITALS — BP 139/75 | HR 80 | Temp 95.9°F | Resp 19 | Wt 160.5 lb

## 2023-02-27 DIAGNOSIS — Z79818 Long term (current) use of other agents affecting estrogen receptors and estrogen levels: Secondary | ICD-10-CM | POA: Diagnosis not present

## 2023-02-27 DIAGNOSIS — C7951 Secondary malignant neoplasm of bone: Secondary | ICD-10-CM | POA: Insufficient documentation

## 2023-02-27 DIAGNOSIS — C61 Malignant neoplasm of prostate: Secondary | ICD-10-CM | POA: Diagnosis present

## 2023-02-27 DIAGNOSIS — Z79899 Other long term (current) drug therapy: Secondary | ICD-10-CM | POA: Diagnosis not present

## 2023-02-27 NOTE — Progress Notes (Signed)
Hematology/Oncology Consult note [facility]  [phone] [phone]  Patient Care Team: Marguarite Arbour, MD as PCP - General (Internal Medicine) End, Cristal Deer, MD as PCP - Cardiology (Cardiology) Benita Gutter, RN as Registered Nurse Delight Ovens, MD (Inactive) as Consulting Physician (Cardiothoracic Surgery) Creig Hines, MD as Consulting Physician (Oncology) Carmina Miller, MD as Referring Physician (Radiation Oncology)   Name of the patient: Casey Acevedo  [id number]  [birth year]   Date of visit: 02/27/23  Diagnosis- metastatic castrate sensitive prostate cancer   Chief complaint/ Reason for visit-routine follow-up of prostate cancer  Heme/Onc history:  Patient is a 82 year old male 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 Pathology showed no residual carcinoma at primary site. 1/5 LN positive for malignancy. 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  Patient currently remains in remission    Patient was diagnosed with high-grade prostate cancer in 2021.  Initial PSA was 14.  It was a stage IIIc adenocarcinoma with a Gleason's 9.  He received IMRT radiation therapy following which his PSA declined to 0.05.  He received 1 dose of Lupron in April 2023 but none after that.  His PSA was 0.05 a year ago and then went up to 0.226 months ago and is presently at 10.9. CT chest abdomen and pelvis with contrast showed multifocal sclerotic osseous foci along with enlarged retroperitoneal and hiatal lymph nodes consistent with malignancy.  Bone scan also showed abnormal radiotracer uptake involving calvarium spine ribs right clavicle and pelvic girdle.  Plan is to proceed with continuous ADT plus oral  antiandrogen agent apalutamide   Interval history-patient will be receiving his apalutamide prescription in a day or 2.  He tolerated Deborra Medina well without any significant side effects.  ECOG PS- 1 Pain scale- 0   Review of systems- Review of Systems  Constitutional:  Negative for chills, fever, malaise/fatigue and weight loss.  HENT:  Negative for congestion, ear discharge and nosebleeds.   Eyes:  Negative for blurred vision.  Respiratory:  Negative for cough, hemoptysis, sputum production, shortness of breath and wheezing.   Cardiovascular:  Negative for chest pain, palpitations, orthopnea and claudication.  Gastrointestinal:  Negative for abdominal pain, blood in stool, constipation, diarrhea, heartburn, melena, nausea and vomiting.  Genitourinary:  Negative for dysuria, flank pain, frequency, hematuria and urgency.  Musculoskeletal:  Negative for back pain, joint pain and myalgias.  Skin:  Negative for rash.  Neurological:  Negative for dizziness, tingling, focal weakness, seizures, weakness and headaches.  Endo/Heme/Allergies:  Does not bruise/bleed easily.  Psychiatric/Behavioral:  Negative for depression and suicidal ideas. The patient does not have insomnia.       Allergies  Allergen Reactions   Tramadol Other (See Comments)    Hallucination Pt states that it made crazy.     Past Medical History:  Diagnosis Date   Aortic atherosclerosis (HCC)    Arthritis    Bladder tumor    Compression fracture of thoracic spine, non-traumatic (HCC) 01/28/2018   Coronary artery calcification    Dysphagia    Elevated liver enzymes    Esophageal cancer (HCC) 2018   Chemo + Rad tx's with surgical resection.    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 (HCC) 06/05/2017     Past Surgical History:  Procedure Laterality Date   CHOLECYSTECTOMY  COLONOSCOPY     COMPLETE ESOPHAGECTOMY N/A 06/18/2017   Procedure: cervical ESOPHAGECTOMY COMPLETE;  Surgeon: Delight Ovens, MD;  Location: [facility] OR;  Service: Thoracic;  Laterality: N/A;  NEED NIMS ET TUBE   ESOPHAGOGASTRODUODENOSCOPY (EGD) WITH PROPOFOL N/A 12/07/2016   Procedure: ESOPHAGOGASTRODUODENOSCOPY (EGD) WITH PROPOFOL;  Surgeon: Wyline Mood, MD;  Location: [facility] ENDOSCOPY;  Service: Gastroenterology;  Laterality: N/A;   ESOPHAGOGASTRODUODENOSCOPY (EGD) WITH PROPOFOL N/A 12/26/2016   Procedure: ESOPHAGOGASTRODUODENOSCOPY (EGD) WITH PROPOFOL WITH DILATION;  Surgeon: Wyline Mood, MD;  Location: [facility] ENDOSCOPY;  Service: Gastroenterology;  Laterality: N/A;   EYE SURGERY Bilateral    Cataracts   GASTROJEJUNOSTOMY N/A 01/16/2017   Procedure: LAPROSCOPIC ASSIST FEEDING JEJUNOSTOMY TUBE;  Surgeon: Luretha Murphy, MD;  Location: [facility] ORS;  Service: General;  Laterality: N/A;   IR FLUORO GUIDE PORT INSERTION RIGHT  01/10/2017   IR REMOVAL TUN ACCESS W/ PORT W/O FL MOD SED  03/22/2018   IR REPLACE G-TUBE SIMPLE WO FLUORO  03/15/2017   IR REPLC DUODEN/JEJUNO TUBE PERCUT W/FLUORO  03/05/2017   KYPHOPLASTY N/A 08/31/2020   Procedure: L1 KYPHOPLASTY;  Surgeon: Kennedy Bucker, MD;  Location: [facility] ORS;  Service: Orthopedics;  Laterality: N/A;   KYPHOPLASTY N/A 10/05/2020   Procedure: T12 KYPHOPLASTY;  Surgeon: Kennedy Bucker, MD;  Location: [facility] ORS;  Service: Orthopedics;  Laterality: N/A;   PICC LINE INSERTION Right    PROSTATE BIOPSY N/A 05/03/2021   Procedure: PROSTATE BIOPSY;  Surgeon: Riki Altes, MD;  Location: [facility] ORS;  Service: Urology;  Laterality: N/A;   PYLOROMYOTOMY N/A 06/18/2017   Procedure: Manning Charity;  Surgeon: Delight Ovens, MD;  Location: [facility] OR;  Service: Thoracic;  Laterality: N/A;   TRANSRECTAL ULTRASOUND N/A 05/03/2021   Procedure: TRANSRECTAL ULTRASOUND;  Surgeon: Riki Altes, MD;  Location: [facility] ORS;  Service: Urology;  Laterality: N/A;   TRANSURETHRAL RESECTION OF BLADDER TUMOR N/A 02/15/2021   Procedure: TRANSURETHRAL RESECTION OF BLADDER TUMOR (TURBT);  Surgeon: Riki Altes, MD;  Location: [facility] ORS;  Service: Urology;  Laterality: N/A;   TRANSURETHRAL RESECTION OF BLADDER TUMOR WITH MITOMYCIN-C N/A 01/13/2020   Procedure: TRANSURETHRAL RESECTION OF BLADDER TUMOR WITH gemcitabine;  Surgeon: Riki Altes, MD;  Location: [facility] ORS;  Service: Urology;  Laterality: N/A;   TRANSURETHRAL RESECTION OF PROSTATE N/A 05/03/2021   Procedure: TRANSURETHRAL RESECTION OF THE PROSTATE (TURP);  Surgeon: Riki Altes, MD;  Location: [facility] ORS;  Service: Urology;  Laterality: N/A;   VIDEO BRONCHOSCOPY N/A 06/18/2017   Procedure: VIDEO BRONCHOSCOPY;  Surgeon: Delight Ovens, MD;  Location: [facility] OR;  Service: Thoracic;  Laterality: N/A;    Social History   Socioeconomic History   Marital status: Married    Spouse name: Not on file   Number of children: Not on file   Years of education: Not on file   Highest education level: Not on file  Occupational History   Not on file  Tobacco Use   Smoking status: Never   Smokeless tobacco: Never  Vaping Use   Vaping status: Never Used  Substance and Sexual Activity   Alcohol use: No   Drug use: No   Sexual activity: Not Currently  Other Topics Concern   Not on file  Social History Narrative   Independent at baseline. Ambulatory   Social Drivers of Health   Financial Resource Strain: Low Risk  (12/05/2022)   Received from [facility]   Overall Financial Resource Strain (CARDIA)    Difficulty of Paying  Living Expenses: Not hard at all  Food Insecurity: No Food Insecurity (12/05/2022)   Received from [facility]   Hunger Vital Sign    Worried About Running Out of Food in the Last Year: Never true    Ran Out of Food in the Last Year: Never true  Transportation Needs: No Transportation Needs (12/05/2022)   Received from [facility] - Transportation    In the past 12 months, has lack of transportation kept you from medical appointments or from getting  medications?: No    Lack of Transportation (Non-Medical): No  Physical Activity: Not on file  Stress: Not on file  Social Connections: Not on file  Intimate Partner Violence: Not on file    Family History  Problem Relation Age of Onset   Heart disease Father    Diabetes Mother    Hypertension Mother    Heart attack Brother 64   Prostate cancer Neg Hx    Bladder Cancer Neg Hx    Kidney cancer Neg Hx      Current Outpatient Medications:    Acetaminophen 500 MG capsule, Take by mouth., Disp: , Rfl:    apalutamide (ERLEADA) 60 MG tablet, Take 240 mg by mouth daily., Disp: , Rfl:    Calcium Carb-Cholecalciferol 500-10 MG-MCG TABS, Take 1 tablet by mouth daily., Disp: , Rfl:    Multiple Vitamin (MULTIVITAMIN) tablet, Take 1 tablet by mouth daily., Disp: , Rfl:   Physical exam:  Vitals:   02/27/23 1133  BP: 139/75  Pulse: 80  Resp: 19  Temp: (!) 95.9 F (35.5 C)  TempSrc: Tympanic  SpO2: 100%  Weight: 160 lb 8 oz (72.8 kg)   Physical Exam Cardiovascular:     Rate and Rhythm: Normal rate and regular rhythm.     Heart sounds: Normal heart sounds.  Pulmonary:     Effort: Pulmonary effort is normal.     Breath sounds: Normal breath sounds.  Skin:    General: Skin is warm and dry.  Neurological:     Mental Status: He is alert and oriented to person, place, and time.         Latest Ref Rng & Units 12/02/2021   12:31 PM  CMP  Glucose 70 - 99 mg/dL 161   BUN 8 - 23 mg/dL 27   Creatinine 0.96 - 1.24 mg/dL 0.45   Sodium 409 - 811 mmol/L 140   Potassium 3.5 - 5.1 mmol/L 3.8   Chloride 98 - 111 mmol/L 111   CO2 22 - 32 mmol/L 25   Calcium 8.9 - 10.3 mg/dL 8.8   Total Protein 6.5 - 8.1 g/dL 6.8   Total Bilirubin 0.3 - 1.2 mg/dL 0.4   Alkaline Phos 38 - 126 U/L 132   AST 15 - 41 U/L 12   ALT 0 - 44 U/L 15       Latest Ref Rng & Units 02/21/2023    8:14 AM  CBC  WBC 4.0 - 10.5 K/uL 4.8   Hemoglobin 13.0 - 17.0 g/dL 91.4   Hematocrit 78.2 - 52.0 % 42.9   Platelets  150 - 400 K/uL 198     No images are attached to the encounter.  CT ABDOMEN LIMITED WO CONTRAST Result Date: 02/21/2023 CLINICAL DATA:  Prostate carcinoma with enlarging retroperitoneal adenopathy, post 3 weeks of treatment. Presents for core biopsy EXAM: CT ABDOMEN WITHOUT CONTRAST LIMITED TECHNIQUE: Multidetector CT imaging of the abdomen was performed with the  patient prone in anticipation of core biopsy of the retroperitoneal adenopathy. RADIATION DOSE REDUCTION: This exam was performed according to the departmental dose-optimization program which includes automated exposure control, adjustment of the mA and/or kV according to patient size and/or use of iterative reconstruction technique. COMPARISON:  CT 02/02/2023 FINDINGS: Lower chest: No pleural or pericardial effusion. Hepatobiliary: Visualized portions unremarkable. Cholecystectomy clips. Pancreas: Unremarkable. No pancreatic ductal dilatation or surrounding inflammatory changes. Spleen: Normal in size.  A few small scattered calcified granulomas. Adrenals/Urinary Tract: No adrenal mass. Bilateral urolithiasis, largest 3 cm mid left kidney. No hydronephrosis. Symmetric renal contours. Stomach/Bowel: Moderate hiatal hernia, incompletely visualized. Visualized loops of small bowel and colon unremarkable. Vascular/Lymphatic: Moderate calcified atheromatous aortic plaque. The There is has been significant decrease in the retroperitoneal adenopathy. Dominant target left aortic node 1.8 cm maximum transverse diameter, previously 2.9 cm by my measurement. Dominant aortocaval node 2.1 cm, previously 2.7 cm. Other: No ascites.  No retroperitoneal hematoma. Musculoskeletal: Stable changes of T12 and L1 cement augmentation. No acute findings. IMPRESSION: 1. Significant decrease in retroperitoneal adenopathy since 02/02/2023. Biopsy was deferred after communication with Dr. Smith Robert. 2. Bilateral urolithiasis. 3. Moderate hiatal hernia. 4.  Aortic Atherosclerosis  (ICD10-I70.0). Electronically Signed   By: Corlis Leak M.D.   On: 02/21/2023 14:37   NM Bone Scan Whole Body Result Date: 02/02/2023 CLINICAL DATA:  Esophageal and prostate cancer, follow-up/staging. EXAM: NUCLEAR MEDICINE WHOLE BODY BONE SCAN TECHNIQUE: Whole body anterior and posterior images were obtained approximately 3 hours after intravenous injection of radiopharmaceutical. RADIOPHARMACEUTICALS:  20.93 mCi Technetium-55m MDP IV COMPARISON:  Same day CT and nuclear medicine bone scan May 17, 2021 FINDINGS: New multifocal areas of abnormal radiotracer uptake involving the calvarium, spine, ribs, right clavicle and pelvic girdle. Uptake to suggest osseous metastatic disease. Multifocal radiotracer uptake in a pattern most consistent with degenerative arthropathy. Otherwise physiologic distribution of radiotracer activity. IMPRESSION: New multifocal osteoblastic metastatic disease. Electronically Signed   By: Maudry Mayhew M.D.   On: 02/02/2023 16:16   CT CHEST ABDOMEN PELVIS W CONTRAST Result Date: 02/02/2023 CLINICAL DATA:  History of prostate cancer and esophageal cancer cancer, follow-up. * Tracking Code: BO * EXAM: CT CHEST, ABDOMEN, AND PELVIS WITH CONTRAST TECHNIQUE: Multidetector CT imaging of the chest, abdomen and pelvis was performed following the standard protocol during bolus administration of intravenous contrast. RADIATION DOSE REDUCTION: This exam was performed according to the departmental dose-optimization program which includes automated exposure control, adjustment of the mA and/or kV according to patient size and/or use of iterative reconstruction technique. CONTRAST:  OMNIPAQUE IOHEXOL 300 MG/ML  SOLN COMPARISON:  Multiple priors including CT Jul 24, 2021 and nuclear medicine bone scan February 02, 2023 FINDINGS: CT CHEST FINDINGS Cardiovascular: Aortic atherosclerosis. No central pulmonary embolus on this nondedicated study. Three-vessel coronary artery calcifications. Normal  size heart. Mediastinum/Nodes: Prior esophagectomy with gastric pull-through anatomy. New small low mediastinal lymph node at the level of the hiatus measuring 8 mm in short axis on image 54/3. No suspicious thyroid nodule. Lungs/Pleura: Stable 7 mm right upper lobe pulmonary nodule on image 76/5. No suspicious pulmonary nodules or masses. Bibasilar predominant subpleural reticulations. Mild diffuse bronchial wall thickening. Musculoskeletal: New multifocal sclerotic osseous foci for instance involving the posterior right tenth and ninth ribs on image 114/5 and 109/5 respectively in the T3 vertebral body on image 87/7 this corresponds with avid radiotracer activity on same day bone scan. CT ABDOMEN PELVIS FINDINGS Hepatobiliary: Stable cyst in the dome of the liver on image 51/3.  No suspicious hepatic lesion. Gallbladder surgically absent. No biliary ductal dilation. Pancreas: No pancreatic ductal dilation or evidence of acute inflammation. Spleen: Calcified splenic granulomata. Adrenals/Urinary Tract: No suspicious adrenal mass. Nonobstructive bilateral renal stones. No hydronephrosis. Urinary bladder is unremarkable for degree of distension. Stomach/Bowel: Gastric pull-through anatomy. No pathologic dilation of small or large bowel. Colonic diverticulosis. Vascular/Lymphatic: Enlarged retroperitoneal lymph nodes for instance a left periaortic lymph node measuring 2.3 cm in short axis on image 73/3. Aortic atherosclerosis. The portal, splenic and superior mesenteric veins are patent. Reproductive: Brachytherapy seeds in the prostate gland. Other: Mild mesenteric stranding. Musculoskeletal: Multifocal sclerotic and mixed lesions for instance involving the right pubis on image 118/3 in the left iliac bone near the SI joint on image 68/3, these demonstrate increased radiotracer uptake on same day nuclear medicine bone scan. Similar T11-L1 vertebral body compression deformities and vertebral body augmentations.  IMPRESSION: 1. New multifocal sclerotic osseous foci compatible with osseous metastatic disease, favor prostate primary but would suggest more definitive assessment via PSMA PET-CT. 2. New enlarged retroperitoneal/hiatal lymph nodes, compatible with nodal disease involvement suggest attention on follow-up PET-CT for more definitive assessment of the etiology. 3. Prior esophagectomy with gastric pull-through anatomy. 4. Nonobstructive bilateral renal stones. 5. Colonic diverticulosis. 6.  Aortic Atherosclerosis (ICD10-I70.0). Electronically Signed   By: Maudry Mayhew M.D.   On: 02/02/2023 16:14     Assessment and plan- Patient is a 82 y.o. male  with history of esophageal cancer s/p neoadjuvant chemoradiation and definitive surgery which is in remission since 2018.  He has a history of stage IIIc adenocarcinoma of the prostate in 2021 now presenting with metastatic disease.  He is here for a routine follow-up  Patient went for retroperitoneal lymph node biopsy but the lymph node is already shrunken significantly with 1 dose of Firmagon and therefore lymph node biopsy could not be pursued.  I will reserve NGS testing for any progression in the future.  So far he has tolerated Deborra Medina well and will receive his next dose on 03/06/2023.  Will be checking his PSA on that day as well.  He should be starting apalutamide in a day or 2.  I will tentatively see him back in 5 weeks time for dose 2 of Firmagon with repeat CBC with differential CMP and PSA  Although he has bone metastases he is not receiving Zometa as he has castrate sensitive disease   Visit Diagnosis 1. High risk medication use   2. Prostate cancer (HCC)   3. Encounter for monitoring androgen deprivation therapy      Dr. Owens Shark, MD, MPH [facility] [phone] 02/27/2023 12:39 PM

## 2023-03-06 ENCOUNTER — Inpatient Hospital Stay: Payer: Medicare Other

## 2023-03-06 ENCOUNTER — Ambulatory Visit: Payer: Medicare Other | Admitting: Oncology

## 2023-03-06 VITALS — BP 138/70

## 2023-03-06 DIAGNOSIS — C7951 Secondary malignant neoplasm of bone: Secondary | ICD-10-CM | POA: Diagnosis not present

## 2023-03-06 DIAGNOSIS — C61 Malignant neoplasm of prostate: Secondary | ICD-10-CM

## 2023-03-06 DIAGNOSIS — Z79899 Other long term (current) drug therapy: Secondary | ICD-10-CM

## 2023-03-06 LAB — CBC WITH DIFFERENTIAL (CANCER CENTER ONLY)
Abs Immature Granulocytes: 0.03 10*3/uL (ref 0.00–0.07)
Basophils Absolute: 0.1 10*3/uL (ref 0.0–0.1)
Basophils Relative: 2 %
Eosinophils Absolute: 0.2 10*3/uL (ref 0.0–0.5)
Eosinophils Relative: 4 %
HCT: 46.8 % (ref 39.0–52.0)
Hemoglobin: 15.4 g/dL (ref 13.0–17.0)
Immature Granulocytes: 1 %
Lymphocytes Relative: 25 %
Lymphs Abs: 1.3 10*3/uL (ref 0.7–4.0)
MCH: 30.9 pg (ref 26.0–34.0)
MCHC: 32.9 g/dL (ref 30.0–36.0)
MCV: 94 fL (ref 80.0–100.0)
Monocytes Absolute: 0.4 10*3/uL (ref 0.1–1.0)
Monocytes Relative: 8 %
Neutro Abs: 3 10*3/uL (ref 1.7–7.7)
Neutrophils Relative %: 60 %
Platelet Count: 266 10*3/uL (ref 150–400)
RBC: 4.98 MIL/uL (ref 4.22–5.81)
RDW: 13.5 % (ref 11.5–15.5)
WBC Count: 5 10*3/uL (ref 4.0–10.5)
nRBC: 0 % (ref 0.0–0.2)

## 2023-03-06 LAB — CMP (CANCER CENTER ONLY)
ALT: 23 U/L (ref 0–44)
AST: 15 U/L (ref 15–41)
Albumin: 4.2 g/dL (ref 3.5–5.0)
Alkaline Phosphatase: 193 U/L — ABNORMAL HIGH (ref 38–126)
Anion gap: 11 (ref 5–15)
BUN: 26 mg/dL — ABNORMAL HIGH (ref 8–23)
CO2: 23 mmol/L (ref 22–32)
Calcium: 8.9 mg/dL (ref 8.9–10.3)
Chloride: 106 mmol/L (ref 98–111)
Creatinine: 1.18 mg/dL (ref 0.61–1.24)
GFR, Estimated: >60 mL/min (ref >60)
Glucose, Bld: 151 mg/dL — ABNORMAL HIGH (ref 70–99)
Potassium: 3.8 mmol/L (ref 3.5–5.1)
Sodium: 140 mmol/L (ref 135–145)
Total Bilirubin: 0.5 mg/dL (ref <1.2)
Total Protein: 7.6 g/dL (ref 6.5–8.1)

## 2023-03-06 LAB — PSA: Prostatic Specific Antigen: 0.52 ng/mL (ref 0.00–4.00)

## 2023-03-06 MED ORDER — DEGARELIX ACETATE 80 MG ~~LOC~~ SOLR
80.0000 mg | Freq: Once | SUBCUTANEOUS | Status: AC
  Administered 2023-03-06: 80 mg via SUBCUTANEOUS
  Filled 2023-03-06: qty 4

## 2023-04-02 ENCOUNTER — Other Ambulatory Visit: Payer: Self-pay | Admitting: *Deleted

## 2023-04-02 DIAGNOSIS — C61 Malignant neoplasm of prostate: Secondary | ICD-10-CM

## 2023-04-03 ENCOUNTER — Telehealth: Payer: Self-pay | Admitting: *Deleted

## 2023-04-03 ENCOUNTER — Other Ambulatory Visit: Payer: Self-pay

## 2023-04-03 ENCOUNTER — Encounter: Payer: Self-pay | Admitting: Oncology

## 2023-04-03 ENCOUNTER — Inpatient Hospital Stay (HOSPITAL_BASED_OUTPATIENT_CLINIC_OR_DEPARTMENT_OTHER): Payer: Medicare Other | Admitting: Oncology

## 2023-04-03 ENCOUNTER — Inpatient Hospital Stay: Payer: Medicare Other

## 2023-04-03 ENCOUNTER — Inpatient Hospital Stay: Payer: Medicare Other | Attending: Oncology | Admitting: Oncology

## 2023-04-03 VITALS — BP 120/74 | HR 84 | Temp 95.4°F | Resp 18 | Wt 152.7 lb

## 2023-04-03 DIAGNOSIS — R5383 Other fatigue: Secondary | ICD-10-CM | POA: Diagnosis not present

## 2023-04-03 DIAGNOSIS — G893 Neoplasm related pain (acute) (chronic): Secondary | ICD-10-CM

## 2023-04-03 DIAGNOSIS — C61 Malignant neoplasm of prostate: Secondary | ICD-10-CM

## 2023-04-03 DIAGNOSIS — Z79899 Other long term (current) drug therapy: Secondary | ICD-10-CM | POA: Diagnosis not present

## 2023-04-03 DIAGNOSIS — R634 Abnormal weight loss: Secondary | ICD-10-CM | POA: Insufficient documentation

## 2023-04-03 DIAGNOSIS — Z79891 Long term (current) use of opiate analgesic: Secondary | ICD-10-CM | POA: Diagnosis not present

## 2023-04-03 DIAGNOSIS — C7951 Secondary malignant neoplasm of bone: Secondary | ICD-10-CM | POA: Insufficient documentation

## 2023-04-03 LAB — CMP (CANCER CENTER ONLY)
ALT: 14 U/L (ref 0–44)
AST: 11 U/L — ABNORMAL LOW (ref 15–41)
Albumin: 3.9 g/dL (ref 3.5–5.0)
Alkaline Phosphatase: 146 U/L — ABNORMAL HIGH (ref 38–126)
Anion gap: 10 (ref 5–15)
BUN: 21 mg/dL (ref 8–23)
CO2: 22 mmol/L (ref 22–32)
Calcium: 8.9 mg/dL (ref 8.9–10.3)
Chloride: 106 mmol/L (ref 98–111)
Creatinine: 1.28 mg/dL — ABNORMAL HIGH (ref 0.61–1.24)
GFR, Estimated: 56 mL/min — ABNORMAL LOW (ref >60)
Glucose, Bld: 166 mg/dL — ABNORMAL HIGH (ref 70–99)
Potassium: 3.7 mmol/L (ref 3.5–5.1)
Sodium: 138 mmol/L (ref 135–145)
Total Bilirubin: 0.6 mg/dL (ref 0.0–1.2)
Total Protein: 7.4 g/dL (ref 6.5–8.1)

## 2023-04-03 LAB — CBC WITH DIFFERENTIAL (CANCER CENTER ONLY)
Abs Immature Granulocytes: 0.03 10*3/uL (ref 0.00–0.07)
Basophils Absolute: 0.1 10*3/uL (ref 0.0–0.1)
Basophils Relative: 2 %
Eosinophils Absolute: 0.2 10*3/uL (ref 0.0–0.5)
Eosinophils Relative: 5 %
HCT: 44.9 % (ref 39.0–52.0)
Hemoglobin: 14.9 g/dL (ref 13.0–17.0)
Immature Granulocytes: 1 %
Lymphocytes Relative: 23 %
Lymphs Abs: 1.1 10*3/uL (ref 0.7–4.0)
MCH: 30.6 pg (ref 26.0–34.0)
MCHC: 33.2 g/dL (ref 30.0–36.0)
MCV: 92.2 fL (ref 80.0–100.0)
Monocytes Absolute: 0.5 10*3/uL (ref 0.1–1.0)
Monocytes Relative: 10 %
Neutro Abs: 2.8 10*3/uL (ref 1.7–7.7)
Neutrophils Relative %: 59 %
Platelet Count: 234 10*3/uL (ref 150–400)
RBC: 4.87 MIL/uL (ref 4.22–5.81)
RDW: 13.5 % (ref 11.5–15.5)
WBC Count: 4.7 10*3/uL (ref 4.0–10.5)
nRBC: 0 % (ref 0.0–0.2)

## 2023-04-03 LAB — PSA: Prostatic Specific Antigen: 0.07 ng/mL (ref 0.00–4.00)

## 2023-04-03 MED ORDER — DEGARELIX ACETATE 80 MG ~~LOC~~ SOLR
80.0000 mg | Freq: Once | SUBCUTANEOUS | Status: AC
  Administered 2023-04-03: 80 mg via SUBCUTANEOUS
  Filled 2023-04-03: qty 4

## 2023-04-03 MED ORDER — OXYCODONE HCL 5 MG PO TABS: 5.0000 mg | ORAL_TABLET | ORAL | 0 refills | Status: DC | PRN

## 2023-04-03 NOTE — Telephone Encounter (Signed)
Patient called and said that pharmacy does not have approval for the oxycodone. It needs a prior auth from his insurance. The pharmacy gave me the number to get PA [phone] and I got the [id number] and approved for through  03/02/2024. I called the pharmacy and  gave them the info and they will fill the oxycodone and I called the pt and let him know that they are working to fill the med and he will get a notice when it is done. Pt. Was thankful for the help

## 2023-04-03 NOTE — Progress Notes (Signed)
Hematology/Oncology Consult note [facility]  Telephone:([phone] [phone]  Patient Care Team: Marguarite Arbour, MD as PCP - General (Internal Medicine) End, Cristal Deer, MD as PCP - Cardiology (Cardiology) Benita Gutter, RN as Registered Nurse Delight Ovens, MD (Inactive) as Consulting Physician (Cardiothoracic Surgery) Creig Hines, MD as Consulting Physician (Oncology) Carmina Miller, MD as Referring Physician (Radiation Oncology)   Name of the patient: Casey Acevedo  [id number]  1940/07/28   Date of visit: 04/03/23  Diagnosis-  metastatic castrate sensitive prostate cancer     Chief complaint/ Reason for visit-routine follow-up of prostate cancer currently on Firmagon and apalutamide  Heme/Onc history: Patient is a 83 year old male 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 Pathology showed no residual carcinoma at primary site. 1/5 LN positive for malignancy. 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  Patient currently remains in remission    Patient was diagnosed with high-grade prostate cancer in 2021.  Initial PSA was 14.  It was a stage IIIc adenocarcinoma with a Gleason's 9.  He received IMRT radiation therapy following which his PSA declined to 0.05.  He received 1 dose of Lupron in April 2023 but none after that.  His PSA was 0.05 a year ago and then went up to 0.226 months ago and is presently at 10.9. CT chest abdomen and pelvis with contrast showed multifocal sclerotic osseous foci along with enlarged retroperitoneal and hiatal lymph nodes consistent with malignancy.  Bone scan also showed abnormal radiotracer uptake involving calvarium spine ribs right clavicle and pelvic girdle.   Patient is  currently on ADT and was started on apalutamide in December 2024    Interval history-patient reports significant fatigue , Lack of active diet and ongoing weight loss after he started apalutamide.  He is lost 10 pounds in the last 2 months.  Also reports ongoing left chest wall pain which starts around his sternum and circles back around his ribs.  He was very active before starting treatment but has not been able to do much since we initiated treatment.  ECOG PS- 2 Pain scale- 7 Opioid associated constipation- no  Review of systems- Review of Systems  Constitutional:  Positive for malaise/fatigue and weight loss. Negative for chills and fever.  HENT:  Negative for congestion, ear discharge and nosebleeds.   Eyes:  Negative for blurred vision.  Respiratory:  Negative for cough, hemoptysis, sputum production, shortness of breath and wheezing.   Cardiovascular:  Negative for chest pain, palpitations, orthopnea and claudication.  Gastrointestinal:  Negative for abdominal pain, blood in stool, constipation, diarrhea, heartburn, melena, nausea and vomiting.  Genitourinary:  Negative for dysuria, flank pain, frequency, hematuria and urgency.  Musculoskeletal:  Negative for back pain, joint pain and myalgias.       Left-sided chest wall pain  Skin:  Negative for rash.  Neurological:  Negative for dizziness, tingling, focal weakness, seizures, weakness and headaches.  Endo/Heme/Allergies:  Does not bruise/bleed easily.  Psychiatric/Behavioral:  Negative for depression and suicidal ideas. The patient does not have insomnia.       Allergies  Allergen Reactions   Tramadol Other (See Comments)    Hallucination Pt states that it made crazy.     Past Medical History:  Diagnosis Date   Aortic atherosclerosis (HCC)    Arthritis    Bladder tumor  Compression fracture of thoracic spine, non-traumatic (HCC) 01/28/2018   Coronary artery calcification    Dysphagia    Elevated liver enzymes     Esophageal cancer (HCC) 2018   Chemo + Rad tx's with surgical resection.    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 (HCC) 06/05/2017     Past Surgical History:  Procedure Laterality Date   CHOLECYSTECTOMY     COLONOSCOPY     COMPLETE ESOPHAGECTOMY N/A 06/18/2017   Procedure: cervical ESOPHAGECTOMY COMPLETE;  Surgeon: Delight Ovens, MD;  [facility];  Service: Thoracic;  Laterality: N/A;  NEED NIMS ET TUBE   ESOPHAGOGASTRODUODENOSCOPY (EGD) WITH PROPOFOL N/A 12/07/2016   Procedure: ESOPHAGOGASTRODUODENOSCOPY (EGD) WITH PROPOFOL;  Surgeon: Wyline Mood, MD;  Location[facility] ENDOSCOPY;  Service: Gastroenterology;  Laterality: N/A;   ESOPHAGOGASTRODUODENOSCOPY (EGD) WITH PROPOFOL N/A 12/26/2016   Procedure: ESOPHAGOGASTRODUODENOSCOPY (EGD) WITH PROPOFOL WITH DILATION;  Surgeon: Wyline Mood, MD;  Location: [facility] ENDOSCOPY;  Service: Gastroenterology;  Laterality: N/A;   EYE SURGERY Bilateral    Cataracts   GASTROJEJUNOSTOMY N/A 01/16/2017   Procedure: LAPROSCOPIC ASSIST FEEDING JEJUNOSTOMY TUBE;  Surgeon: Luretha Murphy, MD;  Location: [facility] ORS;  Service: General;  Laterality: N/A;   IR FLUORO GUIDE PORT INSERTION RIGHT  01/10/2017   IR REMOVAL TUN ACCESS W/ PORT W/O FL MOD SED  03/22/2018   IR REPLACE G-TUBE SIMPLE WO FLUORO  03/15/2017   IR REPLC DUODEN/JEJUNO TUBE PERCUT W/FLUORO  03/05/2017   KYPHOPLASTY N/A 08/31/2020   Procedure: L1 KYPHOPLASTY;  Surgeon: Kennedy Bucker, MD;  Location: [facility] ORS;  Service: Orthopedics;  Laterality: N/A;   KYPHOPLASTY N/A 10/05/2020   Procedure: T12 KYPHOPLASTY;  Surgeon: Kennedy Bucker, MD;  Location: [facility] ORS;  Service: Orthopedics;  Laterality: N/A;   PICC LINE INSERTION Right    PROSTATE BIOPSY N/A 05/03/2021   Procedure: PROSTATE BIOPSY;  Surgeon: Riki Altes, MD;  Location: [facility] ORS;  Service: Urology;  Laterality: N/A;   PYLOROMYOTOMY N/A 06/18/2017   Procedure: Manning Charity;  Surgeon: Delight Ovens, MD;  Location: [facility]  OR;  Service: Thoracic;  Laterality: N/A;   TRANSRECTAL ULTRASOUND N/A 05/03/2021   Procedure: TRANSRECTAL ULTRASOUND;  Surgeon: Riki Altes, MD;  Location: [facility] ORS;  Service: Urology;  Laterality: N/A;   TRANSURETHRAL RESECTION OF BLADDER TUMOR N/A 02/15/2021   Procedure: TRANSURETHRAL RESECTION OF BLADDER TUMOR (TURBT);  Surgeon: Riki Altes, MD;  Location: [facility] ORS;  Service: Urology;  Laterality: N/A;   TRANSURETHRAL RESECTION OF BLADDER TUMOR WITH MITOMYCIN-C N/A 01/13/2020   Procedure: TRANSURETHRAL RESECTION OF BLADDER TUMOR WITH gemcitabine;  Surgeon: Riki Altes, MD;  Location: [facility] ORS;  Service: Urology;  Laterality: N/A;   TRANSURETHRAL RESECTION OF PROSTATE N/A 05/03/2021   Procedure: TRANSURETHRAL RESECTION OF THE PROSTATE (TURP);  Surgeon: Riki Altes, MD;  Location: [facility] ORS;  Service: Urology;  Laterality: N/A;   VIDEO BRONCHOSCOPY N/A 06/18/2017   Procedure: VIDEO BRONCHOSCOPY;  Surgeon: Delight Ovens, MD;  Location: [facility] OR;  Service: Thoracic;  Laterality: N/A;    Social History   Socioeconomic History   Marital status: Married    Spouse name: Not on file   Number of children: Not on file   Years of education: Not on file   Highest education level: Not on file  Occupational History   Not on file  Tobacco Use   Smoking status: Never   Smokeless tobacco: Never  Vaping Use   Vaping status: Never Used  Substance and Sexual Activity  Alcohol use: No   Drug use: No   Sexual activity: Not Currently  Other Topics Concern   Not on file  Social History Narrative   Independent at baseline. Ambulatory   Social Drivers of Health   Financial Resource Strain: Low Risk  (12/05/2022)   Received from [facility]   Overall Financial Resource Strain (CARDIA)    Difficulty of Paying Living Expenses: Not hard at all  Food Insecurity: No Food Insecurity (12/05/2022)   Received from [facility]   Hunger Vital Sign     Worried About Running Out of Food in the Last Year: Never true    Ran Out of Food in the Last Year: Never true  Transportation Needs: No Transportation Needs (12/05/2022)   [facility] - [facility]    In the past 12 months, has lack of transportation kept you from medical appointments or from getting medications?: No    Lack of Transportation (Non-Medical): No  Physical Activity: Not on file  Stress: Not on file  Social Connections: Not on file  Intimate Partner Violence: Not on file    Family History  Problem Relation Age of Onset   Heart disease Father    Diabetes Mother    Hypertension Mother    Heart attack Brother 20   Prostate cancer Neg Hx    Bladder Cancer Neg Hx    Kidney cancer Neg Hx      Current Outpatient Medications:    methocarbamol (ROBAXIN) 500 MG tablet, Take by mouth., Disp: , Rfl:    Acetaminophen 500 MG capsule, Take by mouth., Disp: , Rfl:    apalutamide (ERLEADA) 60 MG tablet, Take 240 mg by mouth daily., Disp: , Rfl:    Calcium Carb-Cholecalciferol 500-10 MG-MCG TABS, Take 1 tablet by mouth daily., Disp: , Rfl:    Multiple Vitamin (MULTIVITAMIN) tablet, Take 1 tablet by mouth daily., Disp: , Rfl:    oxyCODONE (OXY IR/ROXICODONE) 5 MG immediate release tablet, Take 1 tablet (5 mg total) by mouth every 4 (four) hours as needed for severe pain (pain score 7-10)., Disp: 120 tablet, Rfl: 0  Physical exam:  Vitals:   04/03/23 1133  BP: 120/74  Pulse: 84  Resp: 18  Temp: (!) 95.4 F (35.2 C)  TempSrc: Tympanic  SpO2: 99%  Weight: 152 lb 11.2 oz (69.3 kg)   Physical Exam Constitutional:      Comments: Appears fatigued  Cardiovascular:     Rate and Rhythm: Normal rate and regular rhythm.     Heart sounds: Normal heart sounds.  Pulmonary:     Effort: Pulmonary effort is normal.     Breath sounds: Normal breath sounds.  Abdominal:     General: Bowel sounds are normal.     Palpations: Abdomen is soft.   Skin:    General: Skin is warm and dry.  Neurological:     General: No focal deficit present.     Mental Status: He is alert and oriented to person, place, and time.         Latest Ref Rng & Units 04/03/2023   10:51 AM  CMP  Glucose 70 - 99 mg/dL 409   BUN 8 - 23 mg/dL 21   Creatinine 8.11 - 1.24 mg/dL 9.14   Sodium 782 - 956 mmol/L 138   Potassium 3.5 - 5.1 mmol/L 3.7   Chloride 98 - 111 mmol/L 106   CO2 22 - 32 mmol/L 22  Calcium 8.9 - 10.3 mg/dL 8.9   Total Protein 6.5 - 8.1 g/dL 7.4   Total Bilirubin 0.0 - 1.2 mg/dL 0.6   Alkaline Phos 38 - 126 U/L 146   AST 15 - 41 U/L 11   ALT 0 - 44 U/L 14       Latest Ref Rng & Units 04/03/2023   10:50 AM  CBC  WBC 4.0 - 10.5 K/uL 4.7   Hemoglobin 13.0 - 17.0 g/dL 24.4   Hematocrit 01.0 - 52.0 % 44.9   Platelets 150 - 400 K/uL 234      Assessment and plan- Patient is a 83 y.o. male with history of metastatic castrate sensitive prostate cancer with bone and lymph Node metastases currently on ADT plus apalutamide here for routine follow-up  Metastatic castrate sensitive prostate cancer: He will receive [place] today and 1 month from now I will switch him to Lupron every 3 months.  Patient has had a significant decrease in his quality of life as well as poor appetite since starting apalutamide.  I will decrease the dose of apalutamide from 240 mg to 180 mg and see if his symptoms are better.  Overall his PSA has Come down significantly from 10.92 months ago to 0.5 in December 2024.  Levels from today are pending.  Neoplasm related pain: I have started him on oxycodone 5 mg every 6 as needed and I will have him see NP Ivin Booty Borders to further titrate his pain medications.  We also be a good opportunity to introduce palliative care to him for further discussion of advance care planning  I will see him back in 1 month with CBC with differential CMP and PSA for the next dose of Lupron   Visit Diagnosis 1. Prostate cancer (HCC)   2.  High risk medication use   3. Neoplasm related pain      Dr. Owens Shark, MD, MPH [facility] at [facility] [phone] 04/03/2023 3:44 PM

## 2023-04-04 ENCOUNTER — Encounter: Payer: Self-pay | Admitting: Oncology

## 2023-04-04 NOTE — Progress Notes (Signed)
 error

## 2023-04-05 ENCOUNTER — Other Ambulatory Visit: Payer: Self-pay | Admitting: Pharmacist

## 2023-04-05 DIAGNOSIS — C61 Malignant neoplasm of prostate: Secondary | ICD-10-CM

## 2023-04-05 MED ORDER — APALUTAMIDE 60 MG PO TABS: 180.0000 mg | ORAL_TABLET | Freq: Every day | ORAL | 2 refills | Status: DC

## 2023-04-10 ENCOUNTER — Encounter: Payer: Medicare Other | Admitting: Genetic Counselor

## 2023-04-13 ENCOUNTER — Telehealth: Payer: Self-pay | Admitting: *Deleted

## 2023-04-13 ENCOUNTER — Inpatient Hospital Stay (HOSPITAL_BASED_OUTPATIENT_CLINIC_OR_DEPARTMENT_OTHER): Payer: Medicare Other | Admitting: Hospice and Palliative Medicine

## 2023-04-13 ENCOUNTER — Encounter: Payer: Self-pay | Admitting: Hospice and Palliative Medicine

## 2023-04-13 ENCOUNTER — Inpatient Hospital Stay: Payer: Medicare Other

## 2023-04-13 ENCOUNTER — Telehealth: Payer: Self-pay

## 2023-04-13 VITALS — BP 124/88 | HR 90 | Temp 97.2°F | Wt 151.0 lb

## 2023-04-13 DIAGNOSIS — Z515 Encounter for palliative care: Secondary | ICD-10-CM | POA: Diagnosis not present

## 2023-04-13 DIAGNOSIS — C61 Malignant neoplasm of prostate: Secondary | ICD-10-CM

## 2023-04-13 DIAGNOSIS — C7951 Secondary malignant neoplasm of bone: Secondary | ICD-10-CM | POA: Diagnosis not present

## 2023-04-13 MED ORDER — OXYCODONE HCL ER 10 MG PO T12A: 10.0000 mg | EXTENDED_RELEASE_TABLET | Freq: Two times a day (BID) | ORAL | 0 refills | Status: DC

## 2023-04-13 MED ORDER — PROCHLORPERAZINE MALEATE 10 MG PO TABS: 10.0000 mg | ORAL_TABLET | Freq: Four times a day (QID) | ORAL | 2 refills | Status: DC | PRN

## 2023-04-13 MED ORDER — ONDANSETRON HCL 8 MG PO TABS: 8.0000 mg | ORAL_TABLET | Freq: Three times a day (TID) | ORAL | 2 refills | Status: DC | PRN

## 2023-04-13 MED ORDER — OXYCODONE HCL 5 MG PO TABS: 5.0000 mg | ORAL_TABLET | ORAL | 0 refills | Status: DC | PRN

## 2023-04-13 NOTE — Telephone Encounter (Signed)
PA initiated for:   Sande Rives (Key: Leonia Reeves) PA Case ID #: [id number] Rx #: [id number]

## 2023-04-13 NOTE — Telephone Encounter (Signed)
Pt. Called at 4:today stating that the long acting pain med needs PA. I called the pharmacy anc they gave me the telephone . [phone] Icalled and got totalk to andrew and he says that the insurance want the pt. To try Xtampza first for the pt. And it is ER pain med. . I do not have a rx for that and it is after hours and I will need to call on Monday. I called the pt and let him know about this also. I have to leave a message

## 2023-04-13 NOTE — Progress Notes (Signed)
[facility] Clinical Social Work  Initial Assessment   Casey Acevedo is a 83 y.o. year old male presenting alone. Clinical Social Work was referred by medical provider, [employer],  for assessment of psychosocial needs.   SDOH (Social Determinants of Health) assessments performed: Yes   SDOH Screenings   Food Insecurity: No Food Insecurity (12/05/2022)   Received from [facility]  Housing: Low Risk  (03/19/2023)   Received from [facility]  Transportation Needs: No Transportation Needs (12/05/2022)   Received from [facility]  Utilities: Not At Risk (12/05/2022)   Received from [facility]  Financial Resource Strain: Low Risk  (12/05/2022)   Received from [facility]  Tobacco Use: Low Risk  (04/13/2023)     Distress Screen completed: No     No data to display            Family/Social Information:  Housing Arrangement: patient lives with his wife. Family members/support persons in your life? 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 Advanced directives: No Services Currently in place:  Medicare  Coping/ Adjustment to diagnosis: Patient understands treatment plan and what happens next? yes Concerns about diagnosis and/or treatment: Pain or discomfort during procedures Patient reported stressors: Adjusting to my illness Hopes and/or priorities: To be pain free. Patient enjoys being outside and exercise Current coping skills/ strengths: Active sense of humor , Average or above average intelligence , Capable of independent living , Communication skills , Financial means , General fund of knowledge , and Supportive family/friends     SUMMARY: Current SDOH Barriers:  None per patient.  Clinical  Social Work Clinical Goal(s):  Demonstrate a reduction in symptoms related to :adjustment to illness.   Interventions: Discussed common feeling and emotions when being diagnosed with cancer, and the importance of support during treatment Informed patient of the support team roles and support services at [facility] Provided CSW contact information and encouraged patient to call with any questions or concerns Provided patient with information about social work role and services offered.  His main focus was getting his pain under control so that his qualify of life could improve.   Follow Up Plan: CSW will follow-up with patient by phone  Patient verbalizes understanding of plan: Yes    Dorothey Baseman, LCSW Clinical Social Worker [facility]

## 2023-04-13 NOTE — Progress Notes (Signed)
Palliative Medicine [facility] at [facility] [phone] [phone]   Name: Casey Acevedo Date: 04/13/2023 MRN: [id number]  DOB: 1940/08/23  Patient Care Team: Marguarite Arbour, MD as PCP - General (Internal Medicine) End, Cristal Deer, MD as PCP - Cardiology (Cardiology) Benita Gutter, RN as Registered Nurse Delight Ovens, MD (Inactive) as Consulting Physician (Cardiothoracic Surgery) Creig Hines, MD as Consulting Physician (Oncology) Carmina Miller, MD as Referring Physician (Radiation Oncology)    REASON FOR CONSULTATION: Casey Acevedo is a 83 y.o. 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  Patient currently on ADT and apalutamide.  Patient has had decreased quality of life with treatment.  Also has had ongoing pain.  Palliative care consulted to address goals manage ongoing symptoms.  SOCIAL HISTORY:     reports that he has never smoked. He has never used smokeless tobacco. He reports that he does not drink alcohol and does not use drugs.  Patient married lives at home with his wife.  He has a son and daughter who lives in [place].  Patient's son is a Development worker, community.  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  ADVANCE DIRECTIVES:  Does not have  CODE STATUS:   PAST MEDICAL HISTORY: Past Medical History:  Diagnosis Date   Aortic atherosclerosis (HCC)    Arthritis    Bladder tumor    Compression fracture of thoracic spine, non-traumatic (HCC) 01/28/2018   Coronary artery calcification    Dysphagia    Elevated liver enzymes    Esophageal cancer (HCC) 2018   Chemo + Rad tx's with surgical resection.    Malignant neoplasm of overlapping sites of  esophagus (HCC) 06/05/2017    PAST SURGICAL HISTORY:  Past Surgical History:  Procedure Laterality Date   CHOLECYSTECTOMY     COLONOSCOPY     COMPLETE ESOPHAGECTOMY N/A 06/18/2017   Procedure: cervical ESOPHAGECTOMY COMPLETE;  Surgeon: Delight Ovens, MD;  Location: [facility] OR;  Service: Thoracic;  Laterality: N/A;  NEED NIMS ET TUBE   ESOPHAGOGASTRODUODENOSCOPY (EGD) WITH PROPOFOL N/A 12/07/2016   Procedure: ESOPHAGOGASTRODUODENOSCOPY (EGD) WITH PROPOFOL;  Surgeon: Wyline Mood, MD;  Location: [facility] ENDOSCOPY;  Service: Gastroenterology;  Laterality: N/A;   ESOPHAGOGASTRODUODENOSCOPY (EGD) WITH PROPOFOL N/A 12/26/2016   Procedure: ESOPHAGOGASTRODUODENOSCOPY (EGD) WITH PROPOFOL WITH DILATION;  Surgeon: Wyline Mood, MD;  Location: [facility] ENDOSCOPY;  Service: Gastroenterology;  Laterality: N/A;   EYE SURGERY Bilateral    Cataracts   GASTROJEJUNOSTOMY N/A 01/16/2017   Procedure: LAPROSCOPIC ASSIST FEEDING JEJUNOSTOMY TUBE;  Surgeon: Luretha Murphy, MD;  Location: [facility] ORS;  Service: General;  Laterality: N/A;   IR FLUORO GUIDE PORT INSERTION RIGHT  01/10/2017   IR REMOVAL TUN ACCESS W/ PORT W/O FL MOD SED  03/22/2018   IR REPLACE G-TUBE SIMPLE WO FLUORO  03/15/2017   IR REPLC DUODEN/JEJUNO TUBE PERCUT W/FLUORO  03/05/2017   KYPHOPLASTY N/A 08/31/2020   Procedure: L1 KYPHOPLASTY;  Surgeon: Kennedy Bucker, MD;  Location: [facility] ORS;  Service: Orthopedics;  Laterality: N/A;   KYPHOPLASTY N/A 10/05/2020   Procedure: T12 KYPHOPLASTY;  Surgeon: Kennedy Bucker, MD;  Location: [facility] ORS;  Service: Orthopedics;  Laterality: N/A;   PICC LINE INSERTION Right    PROSTATE BIOPSY N/A 05/03/2021   Procedure: PROSTATE BIOPSY;  Surgeon: Riki Altes,  MD;  Location: [facility] ORS;  Service: Urology;  Laterality: N/A;   PYLOROMYOTOMY N/A 06/18/2017   Procedure: Manning Charity;  Surgeon: Delight Ovens, MD;  Location: [facility] OR;  Service: Thoracic;  Laterality: N/A;   TRANSRECTAL ULTRASOUND N/A 05/03/2021   Procedure:  TRANSRECTAL ULTRASOUND;  Surgeon: Riki Altes, MD;  Location: [facility] ORS;  Service: Urology;  Laterality: N/A;   TRANSURETHRAL RESECTION OF BLADDER TUMOR N/A 02/15/2021   Procedure: TRANSURETHRAL RESECTION OF BLADDER TUMOR (TURBT);  Surgeon: Riki Altes, MD;  Location: [facility] ORS;  Service: Urology;  Laterality: N/A;   TRANSURETHRAL RESECTION OF BLADDER TUMOR WITH MITOMYCIN-C N/A 01/13/2020   Procedure: TRANSURETHRAL RESECTION OF BLADDER TUMOR WITH gemcitabine;  Surgeon: Riki Altes, MD;  Location: [facility] ORS;  Service: Urology;  Laterality: N/A;   TRANSURETHRAL RESECTION OF PROSTATE N/A 05/03/2021   Procedure: TRANSURETHRAL RESECTION OF THE PROSTATE (TURP);  Surgeon: Riki Altes, MD;  Location: [facility] ORS;  Service: Urology;  Laterality: N/A;   VIDEO BRONCHOSCOPY N/A 06/18/2017   Procedure: VIDEO BRONCHOSCOPY;  Surgeon: Delight Ovens, MD;  Location: [facility] OR;  Service: Thoracic;  Laterality: N/A;    HEMATOLOGY/ONCOLOGY HISTORY:  Oncology History  Malignant neoplasm of lower third of esophagus (HCC) (Resolved)  01/04/2017 Initial Diagnosis   Malignant neoplasm of lower third of esophagus (HCC)   06/22/2017 Cancer Staging   Staging form: Esophagus - Adenocarcinoma, AJCC 8th Edition - Pathologic stage from 06/22/2017: Stage Unknown (ypTX, pN1, cM0, GX) - Signed by Delight Ovens, MD on 06/22/2017   Prostate cancer (HCC)  05/30/2021 Initial Diagnosis   Prostate cancer (HCC)   02/07/2023 Cancer Staging   Staging form: Prostate, AJCC 8th Edition - Clinical stage from 02/07/2023: Stage IVB (cT3, cN1, cM1b, PSA: 10, Grade Group: 5) - Signed by Creig Hines, MD on 02/12/2023 Histopathologic type: Adenocarcinoma, NOS Prostate specific antigen (PSA) range: 10 to 19 Histologic grading system: 5 grade system     ALLERGIES:  is allergic to tramadol.  MEDICATIONS:  Current Outpatient Medications  Medication Sig Dispense Refill   Acetaminophen 500 MG capsule Take by mouth.      apalutamide (ERLEADA) 60 MG tablet Take 3 tablets (180 mg total) by mouth daily. 120 tablet 2   Calcium Carb-Cholecalciferol 500-10 MG-MCG TABS Take 1 tablet by mouth daily.     Multiple Vitamin (MULTIVITAMIN) tablet Take 1 tablet by mouth daily.     oxyCODONE (OXY IR/ROXICODONE) 5 MG immediate release tablet Take 1 tablet (5 mg total) by mouth every 4 (four) hours as needed for severe pain (pain score 7-10). 120 tablet 0   No current facility-administered medications for this visit.    VITAL SIGNS: There were no vitals taken for this visit. There were no vitals filed for this visit.  Estimated body mass index is 23.22 kg/m as calculated from the following:   Height as of 02/21/23: 5\' 8"  (1.727 m).   Weight as of 04/03/23: 152 lb 11.2 oz (69.3 kg).  LABS: CBC:    Component Value Date/Time   WBC 4.7 04/03/2023 1050   WBC 4.8 02/21/2023 0814   HGB 14.9 04/03/2023 1050   HCT 44.9 04/03/2023 1050   PLT 234 04/03/2023 1050   MCV 92.2 04/03/2023 1050   NEUTROABS 2.8 04/03/2023 1050   LYMPHSABS 1.1 04/03/2023 1050   MONOABS 0.5 04/03/2023 1050   EOSABS 0.2 04/03/2023 1050   BASOSABS 0.1 04/03/2023 1050   Comprehensive Metabolic Panel:    Component Value Date/Time   NA 138  04/03/2023 1051   K 3.7 04/03/2023 1051   CL 106 04/03/2023 1051   CO2 22 04/03/2023 1051   BUN 21 04/03/2023 1051   CREATININE 1.28 (H) 04/03/2023 1051   GLUCOSE 166 (H) 04/03/2023 1051   CALCIUM 8.9 04/03/2023 1051   AST 11 (L) 04/03/2023 1051   ALT 14 04/03/2023 1051   ALKPHOS 146 (H) 04/03/2023 1051   BILITOT 0.6 04/03/2023 1051   PROT 7.4 04/03/2023 1051   ALBUMIN 3.9 04/03/2023 1051    RADIOGRAPHIC STUDIES: No results found.  PERFORMANCE STATUS (ECOG) : 2 - Symptomatic, <50% confined to bed  Review of Systems Unless otherwise noted, a complete review of systems is negative.  Physical Exam General: NAD Cardiovascular: regular rate and rhythm Pulmonary: clear ant fields Abdomen: soft,  nontender, + bowel sounds GU: no suprapubic tenderness Extremities: no edema, no joint deformities Skin: no rashes Neurological: Weakness but otherwise nonfocal  IMPRESSION: Met with patient today to discuss goals and symptom management.  Patient describes significant reduction in his quality of life over the past year.  He primarily attributes this to poorly controlled pain.  Patient states that this pain is limiting his ability to engage in activities he finds enjoyable such as woodworking, walks, and yard work.  Patient says that he is taking oxycodone 5 mg, on average 3 times daily.  He states that this helps reduce his pain to about 3-5 out of 10 but is incredibly short-lived.  Patient says he is tolerating pain medications well other than occasional constipation.  Patient is not presently on a bowel regimen.  Had a long conversation with patient regarding strategies for pain management.  I would recommend trial of a long-acting opioid such as OxyContin in addition to utilizing his oxycodone IR as needed for breakthrough pain.  Patient also endorses chronic nausea without vomiting.  He requests antiemetics, which have worked well in the past.  Appetite is poor.  Will send referral to reestablish care with nutrition.  Patient also endorses generalized weakness.  Will have Maureen evaluate for rehab screening.  Patient does not have advance directives.  I sent him home with a MOST form to review.  Patient says he does not think that he would want resuscitation nor have his life prolonged artificially on machines.  However, he feels like his wife would disagree with these decisions.  Encouraged discussion with his family about his wishes.  Will have him see social work to establish ACP documents and complete a MOST form at his convenience.  Of note, patient states that his son is a Development worker, community.  PLAN: -Continue current scope of treatment -Start OxyContin 10 mg every 12 hours -Continue Oxy IR  5 mg every 4 hours as needed for breakthrough pain -Ondansetron/prochlorperazine as needed -Start daily bowel regimen with MiraLAX/senna -Referrals to social work, nutrition, rehab screening -MOST Form reviewed -RTC 3 to 4 weeks  Patient expressed understanding and was in agreement with this plan. He also understands that He can call the clinic at any time with any questions, concerns, or complaints.     Time Total: 45 minutes  Visit consisted of counseling and education dealing with the complex and emotionally intense issues of symptom management and palliative care in the setting of serious and potentially life-threatening illness.Greater than 50%  of this time was spent counseling and coordinating care related to the above assessment and plan.  Signed by: Laurette Schimke, PhD, NP-C

## 2023-04-16 ENCOUNTER — Encounter: Payer: Self-pay | Admitting: Hospice and Palliative Medicine

## 2023-04-17 ENCOUNTER — Other Ambulatory Visit: Payer: Self-pay

## 2023-04-17 ENCOUNTER — Telehealth: Payer: Self-pay | Admitting: *Deleted

## 2023-04-17 ENCOUNTER — Other Ambulatory Visit: Payer: Self-pay | Admitting: *Deleted

## 2023-04-17 MED ORDER — XTAMPZA ER 9 MG PO C12A: 9.0000 mg | EXTENDED_RELEASE_CAPSULE | Freq: Two times a day (BID) | ORAL | 0 refills | Status: DC

## 2023-04-17 NOTE — Progress Notes (Signed)
error 

## 2023-04-18 ENCOUNTER — Other Ambulatory Visit: Payer: Self-pay | Admitting: *Deleted

## 2023-04-18 ENCOUNTER — Other Ambulatory Visit: Payer: Self-pay | Admitting: Hospice and Palliative Medicine

## 2023-04-18 MED ORDER — MORPHINE SULFATE ER 15 MG PO TBCR: 15.0000 mg | EXTENDED_RELEASE_TABLET | Freq: Two times a day (BID) | ORAL | 0 refills | Status: DC

## 2023-04-18 NOTE — Progress Notes (Signed)
 Insurance would not cover OxyContin  or Xtampza . Will try MS Contin  15mg  Q12H #60

## 2023-04-27 ENCOUNTER — Encounter: Payer: Self-pay | Admitting: Hospice and Palliative Medicine

## 2023-04-27 ENCOUNTER — Inpatient Hospital Stay: Payer: Medicare Other | Attending: Oncology | Admitting: Hospice and Palliative Medicine

## 2023-04-27 DIAGNOSIS — G893 Neoplasm related pain (acute) (chronic): Secondary | ICD-10-CM | POA: Insufficient documentation

## 2023-04-27 DIAGNOSIS — C7951 Secondary malignant neoplasm of bone: Secondary | ICD-10-CM | POA: Insufficient documentation

## 2023-04-27 DIAGNOSIS — C61 Malignant neoplasm of prostate: Secondary | ICD-10-CM | POA: Insufficient documentation

## 2023-04-27 DIAGNOSIS — Z79891 Long term (current) use of opiate analgesic: Secondary | ICD-10-CM | POA: Insufficient documentation

## 2023-04-27 DIAGNOSIS — Z515 Encounter for palliative care: Secondary | ICD-10-CM | POA: Insufficient documentation

## 2023-04-27 DIAGNOSIS — Z8501 Personal history of malignant neoplasm of esophagus: Secondary | ICD-10-CM | POA: Insufficient documentation

## 2023-04-27 MED ORDER — OXYCODONE HCL 5 MG PO TABS: 5.0000 mg | ORAL_TABLET | ORAL | 0 refills | Status: DC | PRN

## 2023-04-27 NOTE — Progress Notes (Signed)
Virtual Visit via Telephone Note  I connected with Casey Acevedo on 04/27/23 at  3:20 PM EST by telephone and verified that I am speaking with the correct person using two identifiers.  Location: Patient: Home Provider: Clinic   I discussed the limitations, risks, security and privacy concerns of performing an evaluation and management service by telephone and the availability of in person appointments. I also discussed with the patient that there may be a patient responsible charge related to this service. The patient expressed understanding and agreed to proceed.   History of Present Illness: Casey Acevedo is a 83 y.o. 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  Patient currently on ADT and apalutamide.  Patient has had decreased quality of life with treatment.  Also has had ongoing pain.  Palliative care consulted to address goals manage ongoing symptoms.    Observations/Objective: I spoke with patient by phone.  Patient reports he is doing well.    Patient reports overall improvement in pain.  He tried a dose of MS Contin but found it to be too sedating.  Subsequently, he says he has been managing his pain well with as needed oxycodone.  He says that pain is primarily in his low back, which he rates as 3 out of 10.  He is currently only using oxycodone 2-3 times daily.  Patient does request refill today.  Patient denies any other symptomatic complaints or concerns.  Patient says that he is not talk to family or given much consideration to completing the MOST form or advanced directives.  He is still thinking about decisions.  Assessment and Plan: Metastatic prostate cancer -on apalutamide   Neoplasm related pain -discontinue MS Contin.  Refill oxycodone.  PDMP  reviewed.  Daily bowel regimen to prevent opioid-induced constipation.  Follow Up Instructions: Follow-up telephone visit 1 to 2 months   I discussed the assessment and treatment plan with the patient. The patient was provided an opportunity to ask questions and all were answered. The patient agreed with the plan and demonstrated an understanding of the instructions.   The patient was advised to call back or seek an in-person evaluation if the symptoms worsen or if the condition fails to improve as anticipated.  I provided 10 minutes of non-face-to-face time during this encounter.   Malachy Moan, NP

## 2023-05-04 ENCOUNTER — Inpatient Hospital Stay: Payer: Medicare Other

## 2023-05-04 ENCOUNTER — Inpatient Hospital Stay: Payer: Medicare Other | Admitting: Oncology

## 2023-05-04 ENCOUNTER — Encounter: Payer: Self-pay | Admitting: Oncology

## 2023-05-04 VITALS — BP 137/81 | HR 86 | Temp 95.1°F | Resp 18 | Wt 159.0 lb

## 2023-05-04 DIAGNOSIS — G893 Neoplasm related pain (acute) (chronic): Secondary | ICD-10-CM | POA: Diagnosis not present

## 2023-05-04 DIAGNOSIS — Z5181 Encounter for therapeutic drug level monitoring: Secondary | ICD-10-CM | POA: Diagnosis not present

## 2023-05-04 DIAGNOSIS — Z79891 Long term (current) use of opiate analgesic: Secondary | ICD-10-CM | POA: Diagnosis not present

## 2023-05-04 DIAGNOSIS — Z515 Encounter for palliative care: Secondary | ICD-10-CM | POA: Diagnosis not present

## 2023-05-04 DIAGNOSIS — C61 Malignant neoplasm of prostate: Secondary | ICD-10-CM

## 2023-05-04 DIAGNOSIS — Z79899 Other long term (current) drug therapy: Secondary | ICD-10-CM | POA: Diagnosis not present

## 2023-05-04 DIAGNOSIS — C7951 Secondary malignant neoplasm of bone: Secondary | ICD-10-CM | POA: Diagnosis present

## 2023-05-04 DIAGNOSIS — Z8501 Personal history of malignant neoplasm of esophagus: Secondary | ICD-10-CM | POA: Diagnosis not present

## 2023-05-04 DIAGNOSIS — Z79818 Long term (current) use of other agents affecting estrogen receptors and estrogen levels: Secondary | ICD-10-CM

## 2023-05-04 LAB — CMP (CANCER CENTER ONLY)
ALT: 13 U/L (ref 0–44)
AST: 15 U/L (ref 15–41)
Albumin: 3.8 g/dL (ref 3.5–5.0)
Alkaline Phosphatase: 101 U/L (ref 38–126)
Anion gap: 10 (ref 5–15)
BUN: 15 mg/dL (ref 8–23)
CO2: 25 mmol/L (ref 22–32)
Calcium: 9.1 mg/dL (ref 8.9–10.3)
Chloride: 106 mmol/L (ref 98–111)
Creatinine: 1.24 mg/dL (ref 0.61–1.24)
GFR, Estimated: 58 mL/min — ABNORMAL LOW (ref >60)
Glucose, Bld: 134 mg/dL — ABNORMAL HIGH (ref 70–99)
Potassium: 4.1 mmol/L (ref 3.5–5.1)
Sodium: 141 mmol/L (ref 135–145)
Total Bilirubin: 0.8 mg/dL (ref 0.0–1.2)
Total Protein: 6.6 g/dL (ref 6.5–8.1)

## 2023-05-04 LAB — CBC WITH DIFFERENTIAL (CANCER CENTER ONLY)
Abs Immature Granulocytes: 0.03 10*3/uL (ref 0.00–0.07)
Basophils Absolute: 0.1 10*3/uL (ref 0.0–0.1)
Basophils Relative: 2 %
Eosinophils Absolute: 0.3 10*3/uL (ref 0.0–0.5)
Eosinophils Relative: 7 %
HCT: 43 % (ref 39.0–52.0)
Hemoglobin: 14.3 g/dL (ref 13.0–17.0)
Immature Granulocytes: 1 %
Lymphocytes Relative: 26 %
Lymphs Abs: 1.1 10*3/uL (ref 0.7–4.0)
MCH: 31.1 pg (ref 26.0–34.0)
MCHC: 33.3 g/dL (ref 30.0–36.0)
MCV: 93.5 fL (ref 80.0–100.0)
Monocytes Absolute: 0.5 10*3/uL (ref 0.1–1.0)
Monocytes Relative: 11 %
Neutro Abs: 2.3 10*3/uL (ref 1.7–7.7)
Neutrophils Relative %: 53 %
Platelet Count: 221 10*3/uL (ref 150–400)
RBC: 4.6 MIL/uL (ref 4.22–5.81)
RDW: 13.6 % (ref 11.5–15.5)
WBC Count: 4.2 10*3/uL (ref 4.0–10.5)
nRBC: 0 % (ref 0.0–0.2)

## 2023-05-04 MED ORDER — LEUPROLIDE ACETATE (3 MONTH) 22.5 MG IM KIT: 22.5000 mg | PACK | INTRAMUSCULAR | Status: DC

## 2023-05-04 MED ORDER — LEUPROLIDE ACETATE (3 MONTH) 22.5 MG ~~LOC~~ KIT
22.5000 mg | PACK | Freq: Once | SUBCUTANEOUS | Status: AC
  Administered 2023-05-04: 22.5 mg via SUBCUTANEOUS
  Filled 2023-05-04: qty 22.5

## 2023-05-04 NOTE — Progress Notes (Signed)
 Hematology/Oncology Consult note [facility]  [phone] [phone]  Patient Care Team: Marguarite Arbour, MD as PCP - General (Internal Medicine) End, Cristal Deer, MD as PCP - Cardiology (Cardiology) Benita Gutter, RN as Registered Nurse Delight Ovens, MD (Inactive) as Consulting Physician (Cardiothoracic Surgery) Creig Hines, MD as Consulting Physician (Oncology) Carmina Miller, MD as Referring Physician (Radiation Oncology)   Name of the patient: Casey Acevedo  [id number]  April 19, 1940   Date of visit: 05/04/23  Diagnosis- metastatic castrate sensitive prostate cancer     Chief complaint/ Reason for visit- routine f/u of prostate cancer on firmagon and apalutamide  Heme/Onc history: Patient is a 83 year old male 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 Pathology showed no residual carcinoma at primary site. 1/5 LN positive for malignancy. 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  Patient currently remains in remission    Patient was diagnosed with high-grade prostate cancer in 2021.  Initial PSA was 14.  It was a stage IIIc adenocarcinoma with a Gleason's 9.  He received IMRT radiation therapy following which his PSA declined to 0.05.  He received 1 dose of Lupron in April 2023 but none after that.  His PSA was 0.05 a year ago and then went up to 0.226 months ago and is presently at 10.9. CT chest abdomen and pelvis with contrast showed multifocal sclerotic osseous foci along with enlarged retroperitoneal and hiatal lymph nodes consistent with malignancy.  Bone scan also showed abnormal radiotracer uptake involving calvarium spine ribs right clavicle and pelvic girdle.   Patient is currently on ADT and  was started on apalutamide in December 2024  Interval history- reports pain is well controleld with oxycodone 2.5 mg BID. He is back to feeling well and is having a good quality of life presently  ECOG PS- 1 Pain scale- 0   Review of systems- Review of Systems  Constitutional:  Negative for chills, fever, malaise/fatigue and weight loss.  HENT:  Negative for congestion, ear discharge and nosebleeds.   Eyes:  Negative for blurred vision.  Respiratory:  Negative for cough, hemoptysis, sputum production, shortness of breath and wheezing.   Cardiovascular:  Negative for chest pain, palpitations, orthopnea and claudication.  Gastrointestinal:  Negative for abdominal pain, blood in stool, constipation, diarrhea, heartburn, melena, nausea and vomiting.  Genitourinary:  Negative for dysuria, flank pain, frequency, hematuria and urgency.  Musculoskeletal:  Negative for back pain, joint pain and myalgias.  Skin:  Negative for rash.  Neurological:  Negative for dizziness, tingling, focal weakness, seizures, weakness and headaches.  Endo/Heme/Allergies:  Does not bruise/bleed easily.  Psychiatric/Behavioral:  Negative for depression and suicidal ideas. The patient does not have insomnia.       Allergies  Allergen Reactions   Tramadol Other (See Comments)    Hallucination Pt states that it made crazy.     Past Medical History:  Diagnosis Date   Aortic atherosclerosis (HCC)    Arthritis    Bladder tumor    Compression fracture of thoracic spine, non-traumatic (HCC) 01/28/2018   Coronary artery calcification    Dysphagia    Elevated liver enzymes    Esophageal cancer (HCC) 2018   Chemo + Rad tx's with surgical resection.    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 (HCC) 06/05/2017     Past Surgical History:  Procedure Laterality Date   CHOLECYSTECTOMY     COLONOSCOPY     COMPLETE ESOPHAGECTOMY N/A 06/18/2017   Procedure: cervical ESOPHAGECTOMY COMPLETE;  Surgeon:  Delight Ovens, MD;  Location: [facility] OR;  Service: Thoracic;  Laterality: N/A;  NEED NIMS ET TUBE   ESOPHAGOGASTRODUODENOSCOPY (EGD) WITH PROPOFOL N/A 12/07/2016   Procedure: ESOPHAGOGASTRODUODENOSCOPY (EGD) WITH PROPOFOL;  Surgeon: Wyline Mood, MD;  Location: [facility] ENDOSCOPY;  Service: Gastroenterology;  Laterality: N/A;   ESOPHAGOGASTRODUODENOSCOPY (EGD) WITH PROPOFOL N/A 12/26/2016   Procedure: ESOPHAGOGASTRODUODENOSCOPY (EGD) WITH PROPOFOL WITH DILATION;  Surgeon: Wyline Mood, MD;  Location: [facility] ENDOSCOPY;  Service: Gastroenterology;  Laterality: N/A;   EYE SURGERY Bilateral    Cataracts   GASTROJEJUNOSTOMY N/A 01/16/2017   Procedure: LAPROSCOPIC ASSIST FEEDING JEJUNOSTOMY TUBE;  Surgeon: Luretha Murphy, MD;  Location: [facility] ORS;  Service: General;  Laterality: N/A;   IR FLUORO GUIDE PORT INSERTION RIGHT  01/10/2017   IR REMOVAL TUN ACCESS W/ PORT W/O FL MOD SED  03/22/2018   IR REPLACE G-TUBE SIMPLE WO FLUORO  03/15/2017   IR REPLC DUODEN/JEJUNO TUBE PERCUT W/FLUORO  03/05/2017   KYPHOPLASTY N/A 08/31/2020   Procedure: L1 KYPHOPLASTY;  Surgeon: Kennedy Bucker, MD;  Location: [facility] ORS;  Service: Orthopedics;  Laterality: N/A;   KYPHOPLASTY N/A 10/05/2020   Procedure: T12 KYPHOPLASTY;  Surgeon: Kennedy Bucker, MD;  Location: [facility] ORS;  Service: Orthopedics;  Laterality: N/A;   PICC LINE INSERTION Right    PROSTATE BIOPSY N/A 05/03/2021   Procedure: PROSTATE BIOPSY;  Surgeon: Riki Altes, MD;  Location: [facility] ORS;  Service: Urology;  Laterality: N/A;   PYLOROMYOTOMY N/A 06/18/2017   Procedure: Manning Charity;  Surgeon: Delight Ovens, MD;  Location: [facility] OR;  Service: Thoracic;  Laterality: N/A;   TRANSRECTAL ULTRASOUND N/A 05/03/2021   Procedure: TRANSRECTAL ULTRASOUND;  Surgeon: Riki Altes, MD;  Location: [facility] ORS;  Service: Urology;  Laterality: N/A;   TRANSURETHRAL RESECTION OF BLADDER TUMOR N/A 02/15/2021   Procedure: TRANSURETHRAL RESECTION OF BLADDER TUMOR (TURBT);  Surgeon:  Riki Altes, MD;  Location: [facility] ORS;  Service: Urology;  Laterality: N/A;   TRANSURETHRAL RESECTION OF BLADDER TUMOR WITH MITOMYCIN-C N/A 01/13/2020   Procedure: TRANSURETHRAL RESECTION OF BLADDER TUMOR WITH gemcitabine;  Surgeon: Riki Altes, MD;  Location: [facility] ORS;  Service: Urology;  Laterality: N/A;   TRANSURETHRAL RESECTION OF PROSTATE N/A 05/03/2021   Procedure: TRANSURETHRAL RESECTION OF THE PROSTATE (TURP);  Surgeon: Riki Altes, MD;  Location: [facility] ORS;  Service: Urology;  Laterality: N/A;   VIDEO BRONCHOSCOPY N/A 06/18/2017   Procedure: VIDEO BRONCHOSCOPY;  Surgeon: Delight Ovens, MD;  Location: [facility] OR;  Service: Thoracic;  Laterality: N/A;    Social History   Socioeconomic History   Marital status: Married    Spouse name: Not on file   Number of children: Not on file   Years of education: Not on file   Highest education level: Not on file  Occupational History   Not on file  Tobacco Use   Smoking status: Never   Smokeless tobacco: Never  Vaping Use   Vaping status: Never Used  Substance and Sexual Activity   Alcohol use: No   Drug use: No   Sexual activity: Not Currently  Other Topics Concern   Not on file  Social History Narrative   Independent at baseline. Ambulatory   Social Drivers of Health   Financial Resource Strain: Low Risk  (12/05/2022)   Received from [facility]   Overall  Financial Resource Strain (CARDIA)    Difficulty of Paying Living Expenses: Not hard at all  Food Insecurity: No Food Insecurity (12/05/2022)   Received from [facility]   Hunger Vital Sign    Worried About Running Out of Food in the Last Year: Never true    Ran Out of Food in the Last Year: Never true  Transportation Needs: No Transportation Needs (12/05/2022)   Received from [facility] - Transportation    In the past 12 months, has lack of transportation kept you from medical appointments or from  getting medications?: No    Lack of Transportation (Non-Medical): No  Physical Activity: Not on file  Stress: Not on file  Social Connections: Not on file  Intimate Partner Violence: Not on file    Family History  Problem Relation Age of Onset   Heart disease Father    Diabetes Mother    Hypertension Mother    Heart attack Brother 3   Prostate cancer Neg Hx    Bladder Cancer Neg Hx    Kidney cancer Neg Hx      Current Outpatient Medications:    Acetaminophen 500 MG capsule, Take by mouth. (Patient not taking: Reported on 04/13/2023), Disp: , Rfl:    apalutamide (ERLEADA) 60 MG tablet, Take 3 tablets (180 mg total) by mouth daily., Disp: 120 tablet, Rfl: 2   Calcium Carb-Cholecalciferol 500-10 MG-MCG TABS, Take 1 tablet by mouth daily., Disp: , Rfl:    Multiple Vitamin (MULTIVITAMIN) tablet, Take 1 tablet by mouth daily., Disp: , Rfl:    ondansetron (ZOFRAN) 8 MG tablet, Take 1 tablet (8 mg total) by mouth every 8 (eight) hours as needed for nausea or vomiting., Disp: 45 tablet, Rfl: 2   oxyCODONE (OXY IR/ROXICODONE) 5 MG immediate release tablet, Take 1 tablet (5 mg total) by mouth every 4 (four) hours as needed for severe pain (pain score 7-10)., Disp: 120 tablet, Rfl: 0   prochlorperazine (COMPAZINE) 10 MG tablet, Take 1 tablet (10 mg total) by mouth every 6 (six) hours as needed for nausea or vomiting., Disp: 30 tablet, Rfl: 2  Physical exam:  Vitals:   05/04/23 0937  BP: 137/81  Pulse: 86  Resp: 18  Temp: (!) 95.1 F (35.1 C)  TempSrc: Tympanic  SpO2: 100%  Weight: 159 lb (72.1 kg)   Physical Exam Cardiovascular:     Rate and Rhythm: Normal rate and regular rhythm.     Heart sounds: Normal heart sounds.  Pulmonary:     Effort: Pulmonary effort is normal.     Breath sounds: Normal breath sounds.  Skin:    General: Skin is warm and dry.  Neurological:     Mental Status: He is alert and oriented to person, place, and time.         Latest Ref Rng & Units  05/04/2023    9:21 AM  CMP  Glucose 70 - 99 mg/dL 454   BUN 8 - 23 mg/dL 15   Creatinine 0.98 - 1.24 mg/dL 1.19   Sodium 147 - 829 mmol/L 141   Potassium 3.5 - 5.1 mmol/L 4.1   Chloride 98 - 111 mmol/L 106   CO2 22 - 32 mmol/L 25   Calcium 8.9 - 10.3 mg/dL 9.1   Total Protein 6.5 - 8.1 g/dL 6.6   Total Bilirubin 0.0 - 1.2 mg/dL 0.8   Alkaline Phos 38 - 126 U/L 101   AST 15 - 41 U/L  15   ALT 0 - 44 U/L 13       Latest Ref Rng & Units 05/04/2023    9:21 AM  CBC  WBC 4.0 - 10.5 K/uL 4.2   Hemoglobin 13.0 - 17.0 g/dL 16.1   Hematocrit 09.6 - 52.0 % 43.0   Platelets 150 - 400 K/uL 221      Assessment and plan- Patient is a 83 y.o. male with history of metastatic castrate sensitive prostate cancer with bone and lymph node metastases.  He is here for routine follow-up currently on Firmagon plus apalutamide  Patient has received 3 monthly doses of Firmagon so far and I am switching him to Lupron every 3 months starting today.  PSA has come down nicely from 10.9 presently to 0.07.  PSA from today is pending.  Continue apalutamide as well until progression or toxicity.  Neoplasm related pain: Patient complains of occasional back pain and stomach pain.  He notices that the pain sometimes increases when he takes the apalutamide.  However pain is well-controlled with 2.5 mg of oxycodone twice a day.  He discontinued the MS Contin as it was making him feel drowsy  CBC with differential CMP PSA and I will see him back in 6 weeks   Visit Diagnosis 1. Prostate cancer (HCC)   2. Palliative care encounter   3. High risk medication use   4. Encounter for monitoring Lupron therapy   5. Neoplasm related pain      Dr. Owens Shark, MD, MPH [facility] [phone] 05/04/2023 10:17 AM

## 2023-05-05 LAB — PSA: Prostatic Specific Antigen: 0.02 ng/mL (ref 0.00–4.00)

## 2023-05-05 LAB — TESTOSTERONE: Testosterone: 8 ng/dL — ABNORMAL LOW (ref 264–916)

## 2023-06-08 ENCOUNTER — Other Ambulatory Visit: Payer: Self-pay | Admitting: Hospice and Palliative Medicine

## 2023-06-08 ENCOUNTER — Inpatient Hospital Stay: Payer: Medicare Other | Attending: Oncology | Admitting: Hospice and Palliative Medicine

## 2023-06-08 DIAGNOSIS — C61 Malignant neoplasm of prostate: Secondary | ICD-10-CM | POA: Diagnosis not present

## 2023-06-08 DIAGNOSIS — G893 Neoplasm related pain (acute) (chronic): Secondary | ICD-10-CM

## 2023-06-08 DIAGNOSIS — Z515 Encounter for palliative care: Secondary | ICD-10-CM

## 2023-06-08 MED ORDER — OXYCODONE HCL 5 MG PO TABS: 5.0000 mg | ORAL_TABLET | ORAL | 0 refills | Status: DC | PRN

## 2023-06-08 NOTE — Progress Notes (Signed)
 Virtual Visit via Telephone Note  I connected with Casey Acevedo on 06/08/23 at  by telephone and verified that I am speaking with the correct person using two identifiers.  Location: Patient: Home Provider: Clinic   I discussed the limitations, risks, security and privacy concerns of performing an evaluation and management service by telephone and the availability of in person appointments. I also discussed with the patient that there may be a patient responsible charge related to this service. The patient expressed understanding and agreed to proceed.   History of Present Illness: Casey Acevedo is a 83 y.o. 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  Patient currently on ADT and apalutamide.  Patient has had decreased quality of life with treatment.  Also has had ongoing pain.  Palliative care consulted to address goals manage ongoing symptoms.    Observations/Objective: I spoke with patient by phone.  Patient says he is doing well.  Denies any significant changes or concerns.  No symptomatic complaints at present.  Patient reports good performance status and he is exercising and walking daily.  Able to do lawn work.  Does have some mild fatigue, which he attributes to his low testosterone level.  Discussed the role of ADT in managing his cancer.  Also discussed conservative measures for managing fatigue.  Patient states that his pain is well-controlled as long as he takes half to 1 tablet of oxycodone 2-3 times daily.  He request refill of oxycodone today.  Denies any adverse effects.  States he is tolerating medications well.  Assessment and Plan: Metastatic prostate cancer -on ADT  Neoplasm related pain -  Refill oxycodone.  PDMP reviewed.  Daily bowel regimen to  prevent opioid-induced constipation.  Follow Up Instructions: Follow-up telephone visit 1 to 2 months   I discussed the assessment and treatment plan with the patient. The patient was provided an opportunity to ask questions and all were answered. The patient agreed with the plan and demonstrated an understanding of the instructions.   The patient was advised to call back or seek an in-person evaluation if the symptoms worsen or if the condition fails to improve as anticipated.  I provided 10 minutes of non-face-to-face time during this encounter.   Malachy Moan, NP

## 2023-06-15 ENCOUNTER — Encounter: Payer: Self-pay | Admitting: Oncology

## 2023-06-15 ENCOUNTER — Inpatient Hospital Stay: Payer: Medicare Other | Attending: Oncology

## 2023-06-15 ENCOUNTER — Inpatient Hospital Stay (HOSPITAL_BASED_OUTPATIENT_CLINIC_OR_DEPARTMENT_OTHER): Payer: Medicare Other | Admitting: Oncology

## 2023-06-15 VITALS — BP 117/75 | HR 87 | Temp 94.7°F | Resp 18 | Ht 68.0 in | Wt 156.5 lb

## 2023-06-15 DIAGNOSIS — C7951 Secondary malignant neoplasm of bone: Secondary | ICD-10-CM | POA: Insufficient documentation

## 2023-06-15 DIAGNOSIS — Z515 Encounter for palliative care: Secondary | ICD-10-CM

## 2023-06-15 DIAGNOSIS — Z8501 Personal history of malignant neoplasm of esophagus: Secondary | ICD-10-CM | POA: Insufficient documentation

## 2023-06-15 DIAGNOSIS — C61 Malignant neoplasm of prostate: Secondary | ICD-10-CM

## 2023-06-15 DIAGNOSIS — G893 Neoplasm related pain (acute) (chronic): Secondary | ICD-10-CM | POA: Insufficient documentation

## 2023-06-15 DIAGNOSIS — Z79891 Long term (current) use of opiate analgesic: Secondary | ICD-10-CM

## 2023-06-15 DIAGNOSIS — Z79899 Other long term (current) drug therapy: Secondary | ICD-10-CM

## 2023-06-15 LAB — CBC WITH DIFFERENTIAL (CANCER CENTER ONLY)
Abs Immature Granulocytes: 0.01 10*3/uL (ref 0.00–0.07)
Basophils Absolute: 0.1 10*3/uL (ref 0.0–0.1)
Basophils Relative: 3 %
Eosinophils Absolute: 0.4 10*3/uL (ref 0.0–0.5)
Eosinophils Relative: 8 %
HCT: 42.1 % (ref 39.0–52.0)
Hemoglobin: 13.9 g/dL (ref 13.0–17.0)
Immature Granulocytes: 0 %
Lymphocytes Relative: 23 %
Lymphs Abs: 1 10*3/uL (ref 0.7–4.0)
MCH: 30.9 pg (ref 26.0–34.0)
MCHC: 33 g/dL (ref 30.0–36.0)
MCV: 93.6 fL (ref 80.0–100.0)
Monocytes Absolute: 0.6 10*3/uL (ref 0.1–1.0)
Monocytes Relative: 14 %
Neutro Abs: 2.2 10*3/uL (ref 1.7–7.7)
Neutrophils Relative %: 52 %
Platelet Count: 240 10*3/uL (ref 150–400)
RBC: 4.5 MIL/uL (ref 4.22–5.81)
RDW: 13.4 % (ref 11.5–15.5)
WBC Count: 4.3 10*3/uL (ref 4.0–10.5)
nRBC: 0 % (ref 0.0–0.2)

## 2023-06-15 LAB — CMP (CANCER CENTER ONLY)
ALT: 15 U/L (ref 0–44)
AST: 13 U/L — ABNORMAL LOW (ref 15–41)
Albumin: 4 g/dL (ref 3.5–5.0)
Alkaline Phosphatase: 93 U/L (ref 38–126)
Anion gap: 9 (ref 5–15)
BUN: 19 mg/dL (ref 8–23)
CO2: 24 mmol/L (ref 22–32)
Calcium: 9 mg/dL (ref 8.9–10.3)
Chloride: 108 mmol/L (ref 98–111)
Creatinine: 1.2 mg/dL (ref 0.61–1.24)
GFR, Estimated: >60 mL/min (ref >60)
Glucose, Bld: 85 mg/dL (ref 70–99)
Potassium: 4.1 mmol/L (ref 3.5–5.1)
Sodium: 141 mmol/L (ref 135–145)
Total Bilirubin: 0.7 mg/dL (ref 0.0–1.2)
Total Protein: 6.9 g/dL (ref 6.5–8.1)

## 2023-06-15 NOTE — Progress Notes (Signed)
 Hematology/Oncology Consult note [facility]  Telephone:([phone] Fax:([phone]  Patient Care Team: Marguarite Arbour, MD as PCP - General (Internal Medicine) End, Cristal Deer, MD as PCP - Cardiology (Cardiology) Benita Gutter, RN as Registered Nurse Delight Ovens, MD (Inactive) as Consulting Physician (Cardiothoracic Surgery) Creig Hines, MD as Consulting Physician (Oncology) Carmina Miller, MD as Referring Physician (Radiation Oncology)   Name of the patient: Casey Acevedo  [id number]  Mar 15, 1940   Date of visit: 06/15/23  Diagnosis-metastatic castrate sensitive prostate cancer 0 Chief complaint/ Reason for visit-routine follow-up of prostate cancer on Firmagon and apalutamide  Heme/Onc history: Patient is a 83 year old male 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 Pathology showed no residual carcinoma at primary site. 1/5 LN positive for malignancy. 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  Patient currently remains in remission    Patient was diagnosed with high-grade prostate cancer in 2021.  Initial PSA was 14.  It was a stage IIIc adenocarcinoma with a Gleason's 9.  He received IMRT radiation therapy following which his PSA declined to 0.05.  He received 1 dose of Lupron in April 2023 but none after that.  His PSA was 0.05 a year ago and then went up to 0.226 months ago and is presently at 10.9. CT chest abdomen and pelvis with contrast showed multifocal sclerotic osseous foci along with enlarged retroperitoneal and hiatal lymph nodes consistent with malignancy.  Bone scan also showed abnormal radiotracer uptake involving calvarium spine ribs right clavicle and pelvic girdle.   Patient is currently on ADT and  was started on apalutamide in December 2024  Interval history-presently feels well and back to his baseline.  Is independent of his ADLs and IADLs.  He is using 2 oxycodones a day for his back pain and occasional abdominal pain.  Bowel movements are regular.  ECOG PS- 0 Pain scale- 0 Opioid associated constipation- no  Review of systems- Review of Systems  Constitutional:  Negative for chills, fever, malaise/fatigue and weight loss.  HENT:  Negative for congestion, ear discharge and nosebleeds.   Eyes:  Negative for blurred vision.  Respiratory:  Negative for cough, hemoptysis, sputum production, shortness of breath and wheezing.   Cardiovascular:  Negative for chest pain, palpitations, orthopnea and claudication.  Gastrointestinal:  Negative for abdominal pain, blood in stool, constipation, diarrhea, heartburn, melena, nausea and vomiting.  Genitourinary:  Negative for dysuria, flank pain, frequency, hematuria and urgency.  Musculoskeletal:  Negative for back pain, joint pain and myalgias.  Skin:  Negative for rash.  Neurological:  Negative for dizziness, tingling, focal weakness, seizures, weakness and headaches.  Endo/Heme/Allergies:  Does not bruise/bleed easily.  Psychiatric/Behavioral:  Negative for depression and suicidal ideas. The patient does not have insomnia.       Allergies  Allergen Reactions   Tramadol Other (See Comments)    Hallucination Pt states that it made crazy.     Past Medical History:  Diagnosis Date   Aortic atherosclerosis (HCC)    Arthritis    Bladder tumor    Compression fracture of thoracic spine, non-traumatic (HCC) 01/28/2018   Coronary artery calcification    Dysphagia    Elevated liver enzymes    Esophageal cancer (HCC) 2018   Chemo + Rad tx's with surgical resection.    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 (  HCC) 06/05/2017     Past Surgical History:  Procedure Laterality Date   CHOLECYSTECTOMY     COLONOSCOPY      COMPLETE ESOPHAGECTOMY N/A 06/18/2017   Procedure: cervical ESOPHAGECTOMY COMPLETE;  Surgeon: Delight Ovens, MD;  Location: [facility] OR;  Service: Thoracic;  Laterality: N/A;  NEED NIMS ET TUBE   ESOPHAGOGASTRODUODENOSCOPY (EGD) WITH PROPOFOL N/A 12/07/2016   Procedure: ESOPHAGOGASTRODUODENOSCOPY (EGD) WITH PROPOFOL;  Surgeon: Wyline Mood, MD;  Location: [facility] ENDOSCOPY;  Service: Gastroenterology;  Laterality: N/A;   ESOPHAGOGASTRODUODENOSCOPY (EGD) WITH PROPOFOL N/A 12/26/2016   Procedure: ESOPHAGOGASTRODUODENOSCOPY (EGD) WITH PROPOFOL WITH DILATION;  Surgeon: Wyline Mood, MD;  Location: [facility] ENDOSCOPY;  Service: Gastroenterology;  Laterality: N/A;   EYE SURGERY Bilateral    Cataracts   GASTROJEJUNOSTOMY N/A 01/16/2017   Procedure: LAPROSCOPIC ASSIST FEEDING JEJUNOSTOMY TUBE;  Surgeon: Luretha Murphy, MD;  Location: [facility] ORS;  Service: General;  Laterality: N/A;   IR FLUORO GUIDE PORT INSERTION RIGHT  01/10/2017   IR REMOVAL TUN ACCESS W/ PORT W/O FL MOD SED  03/22/2018   IR REPLACE G-TUBE SIMPLE WO FLUORO  03/15/2017   IR REPLC DUODEN/JEJUNO TUBE PERCUT W/FLUORO  03/05/2017   KYPHOPLASTY N/A 08/31/2020   Procedure: L1 KYPHOPLASTY;  Surgeon: Kennedy Bucker, MD;  Location: [facility] ORS;  Service: Orthopedics;  Laterality: N/A;   KYPHOPLASTY N/A 10/05/2020   Procedure: T12 KYPHOPLASTY;  Surgeon: Kennedy Bucker, MD;  Location: [facility] ORS;  Service: Orthopedics;  Laterality: N/A;   PICC LINE INSERTION Right    PROSTATE BIOPSY N/A 05/03/2021   Procedure: PROSTATE BIOPSY;  Surgeon: Riki Altes, MD;  Location: [facility] ORS;  Service: Urology;  Laterality: N/A;   PYLOROMYOTOMY N/A 06/18/2017   Procedure: Manning Charity;  Surgeon: Delight Ovens, MD;  Location: [facility] OR;  Service: Thoracic;  Laterality: N/A;   TRANSRECTAL ULTRASOUND N/A 05/03/2021   Procedure: TRANSRECTAL ULTRASOUND;  Surgeon: Riki Altes, MD;  Location: [facility] ORS;  Service: Urology;  Laterality: N/A;   TRANSURETHRAL RESECTION OF BLADDER  TUMOR N/A 02/15/2021   Procedure: TRANSURETHRAL RESECTION OF BLADDER TUMOR (TURBT);  Surgeon: Riki Altes, MD;  Location: [facility] ORS;  Service: Urology;  Laterality: N/A;   TRANSURETHRAL RESECTION OF BLADDER TUMOR WITH MITOMYCIN-C N/A 01/13/2020   Procedure: TRANSURETHRAL RESECTION OF BLADDER TUMOR WITH gemcitabine;  Surgeon: Riki Altes, MD;  Location: [facility] ORS;  Service: Urology;  Laterality: N/A;   TRANSURETHRAL RESECTION OF PROSTATE N/A 05/03/2021   Procedure: TRANSURETHRAL RESECTION OF THE PROSTATE (TURP);  Surgeon: Riki Altes, MD;  Location: [facility] ORS;  Service: Urology;  Laterality: N/A;   VIDEO BRONCHOSCOPY N/A 06/18/2017   Procedure: VIDEO BRONCHOSCOPY;  Surgeon: Delight Ovens, MD;  Location: [facility] OR;  Service: Thoracic;  Laterality: N/A;    Social History   Socioeconomic History   Marital status: Married    Spouse name: Not on file   Number of children: Not on file   Years of education: Not on file   Highest education level: Not on file  Occupational History   Not on file  Tobacco Use   Smoking status: Never   Smokeless tobacco: Never  Vaping Use   Vaping status: Never Used  Substance and Sexual Activity   Alcohol use: No   Drug use: No   Sexual activity: Not Currently  Other Topics Concern   Not on file  Social History Narrative   Independent at baseline. Ambulatory   Social Drivers of Health   Financial Resource Strain: Low Risk  (12/05/2022)  Received from [facility]   Overall Financial Resource Strain (CARDIA)    Difficulty of Paying Living Expenses: Not hard at all  Food Insecurity: No Food Insecurity (12/05/2022)   Received from [facility]   Hunger Vital Sign    Worried About Running Out of Food in the Last Year: Never true    Ran Out of Food in the Last Year: Never true  Transportation Needs: No Transportation Needs (12/05/2022)   Received from [facility] - Transportation     In the past 12 months, has lack of transportation kept you from medical appointments or from getting medications?: No    Lack of Transportation (Non-Medical): No  Physical Activity: Not on file  Stress: Not on file  Social Connections: Not on file  Intimate Partner Violence: Not on file    Family History  Problem Relation Age of Onset   Heart disease Father    Diabetes Mother    Hypertension Mother    Heart attack Brother 52   Prostate cancer Neg Hx    Bladder Cancer Neg Hx    Kidney cancer Neg Hx      Current Outpatient Medications:    Acetaminophen 500 MG capsule, Take by mouth. (Patient not taking: Reported on 04/13/2023), Disp: , Rfl:    apalutamide (ERLEADA) 60 MG tablet, Take 3 tablets (180 mg total) by mouth daily., Disp: 120 tablet, Rfl: 2   Calcium Carb-Cholecalciferol 500-10 MG-MCG TABS, Take 1 tablet by mouth daily., Disp: , Rfl:    Multiple Vitamin (MULTIVITAMIN) tablet, Take 1 tablet by mouth daily., Disp: , Rfl:    ondansetron (ZOFRAN) 8 MG tablet, Take 1 tablet (8 mg total) by mouth every 8 (eight) hours as needed for nausea or vomiting., Disp: 45 tablet, Rfl: 2   oxyCODONE (OXY IR/ROXICODONE) 5 MG immediate release tablet, Take 1 tablet (5 mg total) by mouth every 4 (four) hours as needed for severe pain (pain score 7-10)., Disp: 120 tablet, Rfl: 0   prochlorperazine (COMPAZINE) 10 MG tablet, Take 1 tablet (10 mg total) by mouth every 6 (six) hours as needed for nausea or vomiting., Disp: 30 tablet, Rfl: 2  Physical exam:  Vitals:   06/15/23 0918  BP: 117/75  Pulse: 87  Resp: 18  Temp: (!) 94.7 F (34.8 C)  TempSrc: Tympanic  SpO2: 99%  Weight: 156 lb 8 oz (71 kg)  Height: 5\' 8"  (1.727 m)   Physical Exam Cardiovascular:     Rate and Rhythm: Normal rate and regular rhythm.     Heart sounds: Normal heart sounds.  Pulmonary:     Effort: Pulmonary effort is normal.     Breath sounds: Normal breath sounds.  Abdominal:     General: Bowel sounds are normal.      Palpations: Abdomen is soft.  Skin:    General: Skin is warm and dry.  Neurological:     Mental Status: He is alert and oriented to person, place, and time.      I have personally reviewed labs listed below:    Latest Ref Rng & Units 06/15/2023    9:05 AM  CMP  Glucose 70 - 99 mg/dL 85   BUN 8 - 23 mg/dL 19   Creatinine 9.62 - 1.24 mg/dL 9.52   Sodium 841 - 324 mmol/L 141   Potassium 3.5 - 5.1 mmol/L 4.1   Chloride 98 - 111 mmol/L 108   CO2 22 - 32 mmol/L  24   Calcium 8.9 - 10.3 mg/dL 9.0   Total Protein 6.5 - 8.1 g/dL 6.9   Total Bilirubin 0.0 - 1.2 mg/dL 0.7   Alkaline Phos 38 - 126 U/L 93   AST 15 - 41 U/L 13   ALT 0 - 44 U/L 15       Latest Ref Rng & Units 06/15/2023    9:06 AM  CBC  WBC 4.0 - 10.5 K/uL 4.3   Hemoglobin 13.0 - 17.0 g/dL 10.2   Hematocrit 72.5 - 52.0 % 42.1   Platelets 150 - 400 K/uL 240      Assessment and plan- Patient is a 83 y.o. male with history of castrate sensitive metastatic prostate cancer with bone and lymph node metastases here for routine follow-up  PSA is trending down from an initial value of 10.9 presently to 0.02.  PSA from today is pending.  He is on Lupron every 3 months and will get his next injection in mid May 2025.  Continue apalutamide until progression or toxicity.  He is tolerating it well without any significant side effects.    Neoplasm related pain: Continue as needed oxycodone   Visit Diagnosis 1. Prostate cancer (HCC)   2. High risk medication use   3. Neoplasm related pain      Dr. Owens Shark, MD, MPH [facility] at [facility] [phone] 06/15/2023 10:51 AM

## 2023-06-16 LAB — PSA: Prostatic Specific Antigen: 0.01 ng/mL (ref 0.00–4.00)

## 2023-07-17 ENCOUNTER — Other Ambulatory Visit: Payer: Self-pay | Admitting: Oncology

## 2023-07-17 ENCOUNTER — Other Ambulatory Visit: Payer: Self-pay

## 2023-07-17 DIAGNOSIS — C61 Malignant neoplasm of prostate: Secondary | ICD-10-CM

## 2023-07-17 MED ORDER — APALUTAMIDE 60 MG PO TABS: 180.0000 mg | ORAL_TABLET | Freq: Every day | ORAL | 2 refills | Status: DC

## 2023-07-18 ENCOUNTER — Encounter: Payer: Self-pay | Admitting: Oncology

## 2023-07-26 ENCOUNTER — Other Ambulatory Visit: Payer: Medicare Other

## 2023-08-01 ENCOUNTER — Encounter: Payer: Self-pay | Admitting: Oncology

## 2023-08-01 ENCOUNTER — Inpatient Hospital Stay (HOSPITAL_BASED_OUTPATIENT_CLINIC_OR_DEPARTMENT_OTHER): Admitting: Oncology

## 2023-08-01 ENCOUNTER — Inpatient Hospital Stay: Attending: Oncology

## 2023-08-01 ENCOUNTER — Inpatient Hospital Stay

## 2023-08-01 VITALS — BP 126/65 | HR 80 | Temp 96.1°F | Resp 19 | Ht 68.0 in | Wt 156.0 lb

## 2023-08-01 DIAGNOSIS — C7951 Secondary malignant neoplasm of bone: Secondary | ICD-10-CM | POA: Insufficient documentation

## 2023-08-01 DIAGNOSIS — C61 Malignant neoplasm of prostate: Secondary | ICD-10-CM | POA: Insufficient documentation

## 2023-08-01 DIAGNOSIS — Z8501 Personal history of malignant neoplasm of esophagus: Secondary | ICD-10-CM | POA: Insufficient documentation

## 2023-08-01 DIAGNOSIS — R5383 Other fatigue: Secondary | ICD-10-CM | POA: Insufficient documentation

## 2023-08-01 DIAGNOSIS — G893 Neoplasm related pain (acute) (chronic): Secondary | ICD-10-CM | POA: Insufficient documentation

## 2023-08-01 DIAGNOSIS — Z79899 Other long term (current) drug therapy: Secondary | ICD-10-CM

## 2023-08-01 DIAGNOSIS — Z79891 Long term (current) use of opiate analgesic: Secondary | ICD-10-CM | POA: Insufficient documentation

## 2023-08-01 DIAGNOSIS — Z5181 Encounter for therapeutic drug level monitoring: Secondary | ICD-10-CM

## 2023-08-01 DIAGNOSIS — Z79818 Long term (current) use of other agents affecting estrogen receptors and estrogen levels: Secondary | ICD-10-CM

## 2023-08-01 LAB — CBC WITH DIFFERENTIAL (CANCER CENTER ONLY)
Abs Immature Granulocytes: 0.02 10*3/uL (ref 0.00–0.07)
Basophils Absolute: 0.1 10*3/uL (ref 0.0–0.1)
Basophils Relative: 3 %
Eosinophils Absolute: 0.2 10*3/uL (ref 0.0–0.5)
Eosinophils Relative: 6 %
HCT: 40.4 % (ref 39.0–52.0)
Hemoglobin: 13.4 g/dL (ref 13.0–17.0)
Immature Granulocytes: 1 %
Lymphocytes Relative: 24 %
Lymphs Abs: 1 10*3/uL (ref 0.7–4.0)
MCH: 30.3 pg (ref 26.0–34.0)
MCHC: 33.2 g/dL (ref 30.0–36.0)
MCV: 91.4 fL (ref 80.0–100.0)
Monocytes Absolute: 0.4 10*3/uL (ref 0.1–1.0)
Monocytes Relative: 11 %
Neutro Abs: 2.2 10*3/uL (ref 1.7–7.7)
Neutrophils Relative %: 55 %
Platelet Count: 222 10*3/uL (ref 150–400)
RBC: 4.42 MIL/uL (ref 4.22–5.81)
RDW: 13.4 % (ref 11.5–15.5)
WBC Count: 3.9 10*3/uL — ABNORMAL LOW (ref 4.0–10.5)
nRBC: 0 % (ref 0.0–0.2)

## 2023-08-01 LAB — CMP (CANCER CENTER ONLY)
ALT: 12 U/L (ref 0–44)
AST: 12 U/L — ABNORMAL LOW (ref 15–41)
Albumin: 3.8 g/dL (ref 3.5–5.0)
Alkaline Phosphatase: 92 U/L (ref 38–126)
Anion gap: 7 (ref 5–15)
BUN: 22 mg/dL (ref 8–23)
CO2: 23 mmol/L (ref 22–32)
Calcium: 8.6 mg/dL — ABNORMAL LOW (ref 8.9–10.3)
Chloride: 107 mmol/L (ref 98–111)
Creatinine: 1.28 mg/dL — ABNORMAL HIGH (ref 0.61–1.24)
GFR, Estimated: 56 mL/min — ABNORMAL LOW (ref >60)
Glucose, Bld: 128 mg/dL — ABNORMAL HIGH (ref 70–99)
Potassium: 4.4 mmol/L (ref 3.5–5.1)
Sodium: 137 mmol/L (ref 135–145)
Total Bilirubin: 0.5 mg/dL (ref 0.0–1.2)
Total Protein: 6.9 g/dL (ref 6.5–8.1)

## 2023-08-01 LAB — PSA: Prostatic Specific Antigen: 0.01 ng/mL (ref 0.00–4.00)

## 2023-08-01 MED ORDER — LEUPROLIDE ACETATE (3 MONTH) 22.5 MG ~~LOC~~ KIT
22.5000 mg | PACK | Freq: Once | SUBCUTANEOUS | Status: AC
  Administered 2023-08-01: 22.5 mg via SUBCUTANEOUS
  Filled 2023-08-01: qty 22.5

## 2023-08-01 NOTE — Progress Notes (Signed)
 Hematology/Oncology Consult note [facility]  [phone] [phone]  Patient Care Team: Yehuda Helms, MD as PCP - General (Internal Medicine) End, Veryl Gottron, MD as PCP - Cardiology (Cardiology) Rochell Chroman, RN as Registered Nurse Norita Beauvais, MD (Inactive) as Consulting Physician (Cardiothoracic Surgery) Avonne Boettcher, MD as Consulting Physician (Oncology) Glenis Langdon, MD as Referring Physician (Radiation Oncology)   Name of the patient: Casey Acevedo  [id number]  10-07-1940   Date of visit: 08/01/23  Diagnosis- metastatic castrate sensitive prostate cancer   Chief complaint/ Reason for visit-routine follow-up of prostate cancer presently on Firmagon  and apalutamide   Heme/Onc history: Patient is a 83 year old male 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 Pathology showed no residual carcinoma at primary site. 1/5 LN positive for malignancy. 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  Patient currently remains in remission    Patient was diagnosed with high-grade prostate cancer in 2021.  Initial PSA was 14.  It was a stage IIIc adenocarcinoma with a Gleason's 9.  He received IMRT radiation therapy following which his PSA declined to 0.05.  He received 1 dose of Lupron  in April 2023 but none after that.  His PSA was 0.05 a year ago and then went up to 0.226 months ago and is presently at 10.9. CT chest abdomen and pelvis with contrast showed multifocal sclerotic osseous foci along with enlarged retroperitoneal and hiatal lymph nodes consistent with malignancy.  Bone scan also showed abnormal radiotracer uptake involving calvarium spine ribs right clavicle and pelvic girdle.   Patient is currently  on ADT and was started on apalutamide  in December 2024  Interval history- back pain currently well controlled with 2 oxycodone  per day. Overall patient enjoys a good quality of life and is independent of his ADLs and IADLs  ECOG PS- 1 Pain scale- 3 Opioid associated constipation- no  Review of systems- Review of Systems  Constitutional:  Positive for malaise/fatigue. Negative for chills, fever and weight loss.  HENT:  Negative for congestion, ear discharge and nosebleeds.   Eyes:  Negative for blurred vision.  Respiratory:  Negative for cough, hemoptysis, sputum production, shortness of breath and wheezing.   Cardiovascular:  Negative for chest pain, palpitations, orthopnea and claudication.  Gastrointestinal:  Negative for abdominal pain, blood in stool, constipation, diarrhea, heartburn, melena, nausea and vomiting.  Genitourinary:  Negative for dysuria, flank pain, frequency, hematuria and urgency.  Musculoskeletal:  Negative for back pain, joint pain and myalgias.  Skin:  Negative for rash.  Neurological:  Negative for dizziness, tingling, focal weakness, seizures, weakness and headaches.  Endo/Heme/Allergies:  Does not bruise/bleed easily.  Psychiatric/Behavioral:  Negative for depression and suicidal ideas. The patient does not have insomnia.       Allergies  Allergen Reactions   Tramadol  Other (See Comments)    Hallucination Pt states that it made crazy.     Past Medical History:  Diagnosis Date   Aortic atherosclerosis (HCC)    Arthritis    Bladder tumor    Compression fracture of thoracic spine, non-traumatic (HCC) 01/28/2018   Coronary artery calcification    Dysphagia    Elevated liver enzymes    Esophageal cancer (HCC) 2018   Chemo + Rad tx's with surgical resection.    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 (HCC) 06/05/2017  Past Surgical History:  Procedure Laterality Date   CHOLECYSTECTOMY     COLONOSCOPY     COMPLETE ESOPHAGECTOMY  N/A 06/18/2017   Procedure: cervical ESOPHAGECTOMY COMPLETE;  Surgeon: Norita Beauvais, MD;  Location: [facility] OR;  Service: Thoracic;  Laterality: N/A;  NEED NIMS ET TUBE   ESOPHAGOGASTRODUODENOSCOPY (EGD) WITH PROPOFOL  N/A 12/07/2016   Procedure: ESOPHAGOGASTRODUODENOSCOPY (EGD) WITH PROPOFOL ;  Surgeon: Luke Salaam, MD;  Location[facility] ENDOSCOPY;  Service: Gastroenterology;  Laterality: N/A;   ESOPHAGOGASTRODUODENOSCOPY (EGD) WITH PROPOFOL  N/A 12/26/2016   Procedure: ESOPHAGOGASTRODUODENOSCOPY (EGD) WITH PROPOFOL  WITH DILATION;  Surgeon: Luke Salaam, MD;  Location: [facility] ENDOSCOPY;  Service: Gastroenterology;  Laterality: N/A;   EYE SURGERY Bilateral    Cataracts   GASTROJEJUNOSTOMY N/A 01/16/2017   Procedure: LAPROSCOPIC ASSIST FEEDING JEJUNOSTOMY TUBE;  Surgeon: Jacolyn Matar, MD;  Location: [facility] ORS;  Service: General;  Laterality: N/A;   IR FLUORO GUIDE PORT INSERTION RIGHT  01/10/2017   IR REMOVAL TUN ACCESS W/ PORT W/O FL MOD SED  03/22/2018   IR REPLACE G-TUBE SIMPLE WO FLUORO  03/15/2017   IR REPLC DUODEN/JEJUNO TUBE PERCUT W/FLUORO  03/05/2017   KYPHOPLASTY N/A 08/31/2020   Procedure: L1 KYPHOPLASTY;  Surgeon: Molli Angelucci, MD;  Location: [facility] ORS;  Service: Orthopedics;  Laterality: N/A;   KYPHOPLASTY N/A 10/05/2020   Procedure: T12 KYPHOPLASTY;  Surgeon: Molli Angelucci, MD;  Location: [facility] ORS;  Service: Orthopedics;  Laterality: N/A;   PICC LINE INSERTION Right    PROSTATE BIOPSY N/A 05/03/2021   Procedure: PROSTATE BIOPSY;  Surgeon: Geraline Knapp, MD;  Location: [facility] ORS;  Service: Urology;  Laterality: N/A;   PYLOROMYOTOMY N/A 06/18/2017   Procedure: Paulita Boss;  Surgeon: Norita Beauvais, MD;  Location: [facility] OR;  Service: Thoracic;  Laterality: N/A;   TRANSRECTAL ULTRASOUND N/A 05/03/2021   Procedure: TRANSRECTAL ULTRASOUND;  Surgeon: Geraline Knapp, MD;  Location: [facility] ORS;  Service: Urology;  Laterality: N/A;   TRANSURETHRAL RESECTION OF BLADDER TUMOR N/A 02/15/2021    Procedure: TRANSURETHRAL RESECTION OF BLADDER TUMOR (TURBT);  Surgeon: Geraline Knapp, MD;  Location: [facility] ORS;  Service: Urology;  Laterality: N/A;   TRANSURETHRAL RESECTION OF BLADDER TUMOR WITH MITOMYCIN -C N/A 01/13/2020   Procedure: TRANSURETHRAL RESECTION OF BLADDER TUMOR WITH gemcitabine ;  Surgeon: Geraline Knapp, MD;  Location: [facility] ORS;  Service: Urology;  Laterality: N/A;   TRANSURETHRAL RESECTION OF PROSTATE N/A 05/03/2021   Procedure: TRANSURETHRAL RESECTION OF THE PROSTATE (TURP);  Surgeon: Geraline Knapp, MD;  Location: [facility] ORS;  Service: Urology;  Laterality: N/A;   VIDEO BRONCHOSCOPY N/A 06/18/2017   Procedure: VIDEO BRONCHOSCOPY;  Surgeon: Norita Beauvais, MD;  Location: [facility] OR;  Service: Thoracic;  Laterality: N/A;    Social History   Socioeconomic History   Marital status: Married    Spouse name: Not on file   Number of children: Not on file   Years of education: Not on file   Highest education level: Not on file  Occupational History   Not on file  Tobacco Use   Smoking status: Never   Smokeless tobacco: Never  Vaping Use   Vaping status: Never Used  Substance and Sexual Activity   Alcohol use: No   Drug use: No   Sexual activity: Not Currently  Other Topics Concern   Not on file  Social History Narrative   Independent at baseline. Ambulatory   Social Drivers of Health   Financial Resource Strain: Low Risk  (12/05/2022)   Received from [facility]   Overall Financial Resource Strain (CARDIA)    Difficulty of Paying Living Expenses: Not hard at all  Food Insecurity: No Food Insecurity (12/05/2022)   Received from [facility]   Hunger Vital Sign    Worried About Running Out of Food in the Last Year: Never true    Ran Out of Food in the Last Year: Never true  Transportation Needs: No Transportation Needs (12/05/2022)   Received from [facility] - Transportation    In the past 12 months,  has lack of transportation kept you from medical appointments or from getting medications?: No    Lack of Transportation (Non-Medical): No  Physical Activity: Not on file  Stress: Not on file  Social Connections: Not on file  Intimate Partner Violence: Not on file    Family History  Problem Relation Age of Onset   Heart disease Father    Diabetes Mother    Hypertension Mother    Heart attack Brother 110   Prostate cancer Neg Hx    Bladder Cancer Neg Hx    Kidney cancer Neg Hx      Current Outpatient Medications:    Acetaminophen  500 MG capsule, Take by mouth. (Patient not taking: Reported on 04/13/2023), Disp: , Rfl:    apalutamide  (ERLEADA ) 60 MG tablet, Take 3 tablets (180 mg total) by mouth daily., Disp: 120 tablet, Rfl: 2   Calcium Carb-Cholecalciferol 500-10 MG-MCG TABS, Take 1 tablet by mouth daily., Disp: , Rfl:    Multiple Vitamin (MULTIVITAMIN) tablet, Take 1 tablet by mouth daily., Disp: , Rfl:    ondansetron  (ZOFRAN ) 8 MG tablet, Take 1 tablet (8 mg total) by mouth every 8 (eight) hours as needed for nausea or vomiting., Disp: 45 tablet, Rfl: 2   oxyCODONE  (OXY IR/ROXICODONE ) 5 MG immediate release tablet, Take 1 tablet (5 mg total) by mouth every 4 (four) hours as needed for severe pain (pain score 7-10)., Disp: 120 tablet, Rfl: 0   prochlorperazine  (COMPAZINE ) 10 MG tablet, Take 1 tablet (10 mg total) by mouth every 6 (six) hours as needed for nausea or vomiting., Disp: 30 tablet, Rfl: 2  Physical exam:  Vitals:   08/01/23 1101  BP: 126/65  Pulse: 80  Resp: 19  Temp: (!) 96.1 F (35.6 C)  TempSrc: Tympanic  SpO2: 100%  Weight: 156 lb (70.8 kg)  Height: 5\' 8"  (1.727 m)   Physical Exam Cardiovascular:     Rate and Rhythm: Normal rate and regular rhythm.     Heart sounds: Normal heart sounds.  Pulmonary:     Effort: Pulmonary effort is normal.     Breath sounds: Normal breath sounds.  Abdominal:     General: Bowel sounds are normal.     Palpations: Abdomen  is soft.  Skin:    General: Skin is warm and dry.  Neurological:     Mental Status: He is alert and oriented to person, place, and time.     I have personally reviewed labs listed below:    Latest Ref Rng & Units 08/01/2023   10:51 AM  CMP  Glucose 70 - 99 mg/dL 960   BUN 8 - 23 mg/dL 22   Creatinine 4.54 - 1.24 mg/dL 0.98   Sodium 119 - 147 mmol/L 137   Potassium 3.5 - 5.1 mmol/L 4.4   Chloride 98 - 111 mmol/L 107   CO2 22 - 32 mmol/L 23   Calcium 8.9 - 10.3 mg/dL 8.6  Total Protein 6.5 - 8.1 g/dL 6.9   Total Bilirubin 0.0 - 1.2 mg/dL 0.5   Alkaline Phos 38 - 126 U/L 92   AST 15 - 41 U/L 12   ALT 0 - 44 U/L 12       Latest Ref Rng & Units 08/01/2023   10:46 AM  CBC  WBC 4.0 - 10.5 K/uL 3.9   Hemoglobin 13.0 - 17.0 g/dL 42.5   Hematocrit 95.6 - 52.0 % 40.4   Platelets 150 - 400 K/uL 222      Assessment and plan- Patient is a 83 y.o. 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 He is here for routine f/u of prostate cancer  Patient is tolerating apalutamide  well at a lower dose of 180 mg daily without any significant side effects.  He is also getting Lupron /Eligard  every 3 months and will receive his next dose today.  I will see him back in 3 months with CBC with differential CMP and PSA and I will plan to switch him to 24-month dose of Lupron  at that time.  I will plan to get a repeat PSMA PET scan in 6 months from now.  Overall PSA is trending down and is stable presently at 0.01.   Visit Diagnosis 1. High risk medication use   2. Prostate cancer (HCC)   3. Encounter for monitoring Lupron  therapy   4. Neoplasm related pain      Dr. Seretha Dance, MD, MPH [facility] [phone] 08/01/2023 2:55 PM

## 2023-08-02 ENCOUNTER — Ambulatory Visit: Payer: Medicare Other | Admitting: Radiation Oncology

## 2023-08-02 ENCOUNTER — Encounter: Payer: Self-pay | Admitting: Oncology

## 2023-08-08 ENCOUNTER — Inpatient Hospital Stay: Admitting: Hospice and Palliative Medicine

## 2023-08-08 DIAGNOSIS — Z515 Encounter for palliative care: Secondary | ICD-10-CM | POA: Diagnosis not present

## 2023-08-08 DIAGNOSIS — G893 Neoplasm related pain (acute) (chronic): Secondary | ICD-10-CM

## 2023-08-08 DIAGNOSIS — C61 Malignant neoplasm of prostate: Secondary | ICD-10-CM

## 2023-08-08 MED ORDER — OXYCODONE HCL 5 MG PO TABS: 5.0000 mg | ORAL_TABLET | ORAL | 0 refills | Status: DC | PRN

## 2023-08-08 MED ORDER — PROCHLORPERAZINE MALEATE 10 MG PO TABS: 10.0000 mg | ORAL_TABLET | Freq: Four times a day (QID) | ORAL | 2 refills | Status: DC | PRN

## 2023-08-08 NOTE — Progress Notes (Signed)
 Virtual Visit via Telephone Note  I connected with Casey Acevedo on 06/08/23 at  by telephone and verified that I am speaking with the correct person using two identifiers.  Location: Patient: Home Provider: Clinic   I discussed the limitations, risks, security and privacy concerns of performing an evaluation and management service by telephone and the availability of in person appointments. I also discussed with the patient that there may be a patient responsible charge related to this service. The patient expressed understanding and agreed to proceed.   History of Present Illness: Casey Acevedo is a 83 y.o. 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  Patient currently on ADT and apalutamide .  Patient has had decreased quality of life with treatment.  Also has had ongoing pain.  Palliative care consulted to address goals manage ongoing symptoms.    Observations/Objective: I spoke with patient by phone.  Patient states he is doing well.  Denies any significant changes or concerns.  Says that he recently drove to [place]  alone to attend a wedding.  Says that his pain is well-controlled on current regimen of as needed oxycodone .  On average, he is taking it 2-3 times daily but at times only requires half a tablet.  He says the pain is more intense for a week after treatment but then dissipates quickly.  Denies any adverse effects from pain medications.  He does request refill of oxycodone  today.  Assessment and Plan: Metastatic prostate cancer -on ADT  Neoplasm related pain -  Refill oxycodone .  PDMP reviewed.  Daily bowel regimen to prevent opioid-induced constipation.  Follow Up Instructions: Follow-up telephone visit 2 to 3 months   I discussed the assessment and  treatment plan with the patient. The patient was provided an opportunity to ask questions and all were answered. The patient agreed with the plan and demonstrated an understanding of the instructions.   The patient was advised to call back or seek an in-person evaluation if the symptoms worsen or if the condition fails to improve as anticipated.  I provided 10 minutes of non-face-to-face time during this encounter.   Peggyann Bower, NP

## 2023-10-22 ENCOUNTER — Inpatient Hospital Stay: Attending: Oncology | Admitting: Hospice and Palliative Medicine

## 2023-10-22 DIAGNOSIS — Z8501 Personal history of malignant neoplasm of esophagus: Secondary | ICD-10-CM | POA: Insufficient documentation

## 2023-10-22 DIAGNOSIS — Z79891 Long term (current) use of opiate analgesic: Secondary | ICD-10-CM | POA: Insufficient documentation

## 2023-10-22 DIAGNOSIS — C7951 Secondary malignant neoplasm of bone: Secondary | ICD-10-CM | POA: Insufficient documentation

## 2023-10-22 DIAGNOSIS — Z515 Encounter for palliative care: Secondary | ICD-10-CM

## 2023-10-22 DIAGNOSIS — G893 Neoplasm related pain (acute) (chronic): Secondary | ICD-10-CM | POA: Diagnosis not present

## 2023-10-22 DIAGNOSIS — C61 Malignant neoplasm of prostate: Secondary | ICD-10-CM

## 2023-10-22 MED ORDER — OXYCODONE HCL 5 MG PO TABS: 5.0000 mg | ORAL_TABLET | ORAL | 0 refills | Status: DC | PRN

## 2023-10-22 NOTE — Progress Notes (Signed)
 Virtual Visit via Telephone Note  I connected with Casey Acevedo on 06/08/23 at  by telephone and verified that I am speaking with the correct person using two identifiers.  Location: Patient: Home Provider: Clinic   I discussed the limitations, risks, security and privacy concerns of performing an evaluation and management service by telephone and the availability of in person appointments. I also discussed with the patient that there may be a patient responsible charge related to this service. The patient expressed understanding and agreed to proceed.   History of Present Illness: Casey Acevedo is a 83 y.o. 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  Patient currently on ADT and apalutamide .  Patient has had decreased quality of life with treatment.  Also has had ongoing pain.  Palliative care consulted to address goals manage ongoing symptoms.    Observations/Objective: I spoke with patient by phone.  Patient states he is doing well.  Denies significant changes or concerns.  He says that his pain is well-controlled and that he is taking oxycodone  1-2 times per day.  He does request refill of oxycodone  today.  Denies any adverse effects.  Patient states that his quality of life is good.  His performance status is stable.  He is still working around the house.    Denies any other symptomatic complaints.  Assessment and Plan: Metastatic prostate cancer -on ADT.  Patient sees Dr. Melanee next week  Neoplasm related pain -  Refill oxycodone .  PDMP reviewed.  Daily bowel regimen to prevent opioid-induced constipation.  Follow Up Instructions: Follow-up telephone visit 2 to 3 months   I discussed the assessment and treatment plan with the patient. The patient was provided  an opportunity to ask questions and all were answered. The patient agreed with the plan and demonstrated an understanding of the instructions.   The patient was advised to call back or seek an in-person evaluation if the symptoms worsen or if the condition fails to improve as anticipated.  I provided 10 minutes of non-face-to-face time during this encounter.   FONDA JONELLE MOWER, NP

## 2023-10-25 ENCOUNTER — Other Ambulatory Visit: Payer: Self-pay | Admitting: Oncology

## 2023-10-25 DIAGNOSIS — C61 Malignant neoplasm of prostate: Secondary | ICD-10-CM

## 2023-10-31 ENCOUNTER — Inpatient Hospital Stay

## 2023-10-31 ENCOUNTER — Inpatient Hospital Stay (HOSPITAL_BASED_OUTPATIENT_CLINIC_OR_DEPARTMENT_OTHER): Admitting: Oncology

## 2023-10-31 ENCOUNTER — Encounter: Payer: Self-pay | Admitting: Oncology

## 2023-10-31 VITALS — BP 129/86 | HR 80 | Temp 96.2°F | Resp 16 | Wt 153.0 lb

## 2023-10-31 DIAGNOSIS — Z5181 Encounter for therapeutic drug level monitoring: Secondary | ICD-10-CM

## 2023-10-31 DIAGNOSIS — Z79899 Other long term (current) drug therapy: Secondary | ICD-10-CM

## 2023-10-31 DIAGNOSIS — C61 Malignant neoplasm of prostate: Secondary | ICD-10-CM

## 2023-10-31 DIAGNOSIS — G893 Neoplasm related pain (acute) (chronic): Secondary | ICD-10-CM | POA: Diagnosis not present

## 2023-10-31 DIAGNOSIS — Z79891 Long term (current) use of opiate analgesic: Secondary | ICD-10-CM | POA: Diagnosis not present

## 2023-10-31 DIAGNOSIS — C7951 Secondary malignant neoplasm of bone: Secondary | ICD-10-CM | POA: Diagnosis not present

## 2023-10-31 DIAGNOSIS — Z8501 Personal history of malignant neoplasm of esophagus: Secondary | ICD-10-CM | POA: Diagnosis not present

## 2023-10-31 LAB — CBC WITH DIFFERENTIAL (CANCER CENTER ONLY)
Abs Immature Granulocytes: 0.02 K/uL (ref 0.00–0.07)
Basophils Absolute: 0.1 K/uL (ref 0.0–0.1)
Basophils Relative: 2 %
Eosinophils Absolute: 0.4 K/uL (ref 0.0–0.5)
Eosinophils Relative: 8 %
HCT: 41.4 % (ref 39.0–52.0)
Hemoglobin: 13.4 g/dL (ref 13.0–17.0)
Immature Granulocytes: 0 %
Lymphocytes Relative: 27 %
Lymphs Abs: 1.3 K/uL (ref 0.7–4.0)
MCH: 30.7 pg (ref 26.0–34.0)
MCHC: 32.4 g/dL (ref 30.0–36.0)
MCV: 95 fL (ref 80.0–100.0)
Monocytes Absolute: 0.4 K/uL (ref 0.1–1.0)
Monocytes Relative: 9 %
Neutro Abs: 2.5 K/uL (ref 1.7–7.7)
Neutrophils Relative %: 54 %
Platelet Count: 234 K/uL (ref 150–400)
RBC: 4.36 MIL/uL (ref 4.22–5.81)
RDW: 13.5 % (ref 11.5–15.5)
WBC Count: 4.7 K/uL (ref 4.0–10.5)
nRBC: 0 % (ref 0.0–0.2)

## 2023-10-31 LAB — PSA: Prostatic Specific Antigen: 0.01 ng/mL (ref 0.00–4.00)

## 2023-10-31 LAB — CMP (CANCER CENTER ONLY)
ALT: 12 U/L (ref 0–44)
AST: 12 U/L — ABNORMAL LOW (ref 15–41)
Albumin: 4.1 g/dL (ref 3.5–5.0)
Alkaline Phosphatase: 72 U/L (ref 38–126)
Anion gap: 7 (ref 5–15)
BUN: 21 mg/dL (ref 8–23)
CO2: 23 mmol/L (ref 22–32)
Calcium: 9.3 mg/dL (ref 8.9–10.3)
Chloride: 110 mmol/L (ref 98–111)
Creatinine: 1.19 mg/dL (ref 0.61–1.24)
GFR, Estimated: >60 mL/min (ref >60)
Glucose, Bld: 99 mg/dL (ref 70–99)
Potassium: 4.3 mmol/L (ref 3.5–5.1)
Sodium: 140 mmol/L (ref 135–145)
Total Bilirubin: 0.6 mg/dL (ref 0.0–1.2)
Total Protein: 6.8 g/dL (ref 6.5–8.1)

## 2023-10-31 MED ORDER — LEUPROLIDE ACETATE (6 MONTH) 45 MG ~~LOC~~ KIT
45.0000 mg | PACK | SUBCUTANEOUS | Status: DC
  Administered 2023-10-31: 45 mg via SUBCUTANEOUS
  Filled 2023-10-31: qty 45

## 2023-10-31 MED ORDER — LEUPROLIDE ACETATE (6 MONTH) 45 MG IM KIT: 45.0000 mg | PACK | INTRAMUSCULAR | Status: DC

## 2023-11-01 ENCOUNTER — Encounter: Payer: Self-pay | Admitting: Oncology

## 2023-11-01 NOTE — Progress Notes (Signed)
 Hematology/Oncology Consult note [facility]  Telephone:([phone] [phone]  Patient Care Team: Auston Reyes BIRCH, MD as PCP - General (Internal Medicine) End, Lonni, MD as PCP - Cardiology (Cardiology) Maurie Rayfield BIRCH, RN as Registered Nurse Army Dallas NOVAK, MD (Inactive) as Consulting Physician (Cardiothoracic Surgery) Melanee Annah BROCKS, MD as Consulting Physician (Oncology) Lenn Aran, MD as Referring Physician (Radiation Oncology)   Name of the patient: Casey Acevedo  [id number]  [birth year]   Date of visit: 11/01/23  Diagnosis-metastatic castrate sensitive prostate cancer  Chief complaint/ Reason for visit-routine follow-up of prostate cancer presently on ADT and apalutamide   Heme/Onc history: Patient is a 83 year old male 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 Pathology showed no residual carcinoma at primary site. 1/5 LN positive for malignancy. 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  Patient currently remains in remission    Patient was diagnosed with high-grade prostate cancer in 2021.  Initial PSA was 14.  It was a stage IIIc adenocarcinoma with a Gleason's 9.  He received IMRT radiation therapy following which his PSA declined to 0.05.  He received 1 dose of Lupron  in April 2023 but none after that.  His PSA was 0.05 a year ago and then went up to 0.226 months ago and is presently at 10.9. CT chest abdomen and pelvis with contrast showed multifocal sclerotic osseous foci along with enlarged retroperitoneal and hiatal lymph nodes consistent with malignancy.  Bone scan also showed abnormal radiotracer uptake involving calvarium spine ribs right clavicle and pelvic girdle.   Patient is currently on ADT  and was started on apalutamide  in December 2024    Interval history-patient feels great presently.  Occasional back pain controlled with as needed oxycodone .  He is independent of his ADLs and IADLs.  Tolerating apalutamide  well without any significant side effects  ECOG PS- 1 Pain scale- 0   Review of systems- Review of Systems  Constitutional:  Negative for chills, fever, malaise/fatigue and weight loss.  HENT:  Negative for congestion, ear discharge and nosebleeds.   Eyes:  Negative for blurred vision.  Respiratory:  Negative for cough, hemoptysis, sputum production, shortness of breath and wheezing.   Cardiovascular:  Negative for chest pain, palpitations, orthopnea and claudication.  Gastrointestinal:  Negative for abdominal pain, blood in stool, constipation, diarrhea, heartburn, melena, nausea and vomiting.  Genitourinary:  Negative for dysuria, flank pain, frequency, hematuria and urgency.  Musculoskeletal:  Negative for back pain, joint pain and myalgias.  Skin:  Negative for rash.  Neurological:  Negative for dizziness, tingling, focal weakness, seizures, weakness and headaches.  Endo/Heme/Allergies:  Does not bruise/bleed easily.  Psychiatric/Behavioral:  Negative for depression and suicidal ideas. The patient does not have insomnia.       Allergies  Allergen Reactions   Tramadol  Other (See Comments)    Hallucination Pt states that it made crazy.     Past Medical History:  Diagnosis Date   Aortic atherosclerosis (HCC)    Arthritis    Bladder tumor    Compression fracture of thoracic spine, non-traumatic (HCC) 01/28/2018   Coronary artery calcification    Dysphagia    Elevated liver enzymes    Esophageal cancer (HCC) 2018   Chemo + Rad tx's with surgical resection.    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 (HCC) 06/05/2017  Past Surgical History:  Procedure Laterality Date   CHOLECYSTECTOMY     COLONOSCOPY     COMPLETE ESOPHAGECTOMY N/A  06/18/2017   Procedure: cervical ESOPHAGECTOMY COMPLETE;  Surgeon: Army Dallas NOVAK, MD;  Location: [facility] OR;  Service: Thoracic;  Laterality: N/A;  NEED NIMS ET TUBE   ESOPHAGOGASTRODUODENOSCOPY (EGD) WITH PROPOFOL  N/A 12/07/2016   Procedure: ESOPHAGOGASTRODUODENOSCOPY (EGD) WITH PROPOFOL ;  Surgeon: Therisa Bi, MD;  Location: [facility] ENDOSCOPY;  Service: Gastroenterology;  Laterality: N/A;   ESOPHAGOGASTRODUODENOSCOPY (EGD) WITH PROPOFOL  N/A 12/26/2016   Procedure: ESOPHAGOGASTRODUODENOSCOPY (EGD) WITH PROPOFOL  WITH DILATION;  Surgeon: Therisa Bi, MD;  Location[facility] ENDOSCOPY;  Service: Gastroenterology;  Laterality: N/A;   EYE SURGERY Bilateral    Cataracts   GASTROJEJUNOSTOMY N/A 01/16/2017   Procedure: LAPROSCOPIC ASSIST FEEDING JEJUNOSTOMY TUBE;  Surgeon: Gladis Cough, MD;  Location: [facility] ORS;  Service: General;  Laterality: N/A;   IR FLUORO GUIDE PORT INSERTION RIGHT  01/10/2017   IR REMOVAL TUN ACCESS W/ PORT W/O FL MOD SED  03/22/2018   IR REPLACE G-TUBE SIMPLE WO FLUORO  03/15/2017   IR REPLC DUODEN/JEJUNO TUBE PERCUT W/FLUORO  03/05/2017   KYPHOPLASTY N/A 08/31/2020   Procedure: L1 KYPHOPLASTY;  Surgeon: Kathlynn Sharper, MD;  Location: [facility] ORS;  Service: Orthopedics;  Laterality: N/A;   KYPHOPLASTY N/A 10/05/2020   Procedure: T12 KYPHOPLASTY;  Surgeon: Kathlynn Sharper, MD;  Location: [facility] ORS;  Service: Orthopedics;  Laterality: N/A;   PICC LINE INSERTION Right    PROSTATE BIOPSY N/A 05/03/2021   Procedure: PROSTATE BIOPSY;  Surgeon: Twylla Glendia BROCKS, MD;  Location: [facility] ORS;  Service: Urology;  Laterality: N/A;   PYLOROMYOTOMY N/A 06/18/2017   Procedure: JUDITHE;  Surgeon: Army Dallas NOVAK, MD;  Location: [facility] OR;  Service: Thoracic;  Laterality: N/A;   TRANSRECTAL ULTRASOUND N/A 05/03/2021   Procedure: TRANSRECTAL ULTRASOUND;  Surgeon: Twylla Glendia BROCKS, MD;  Location: [facility] ORS;  Service: Urology;  Laterality: N/A;   TRANSURETHRAL RESECTION OF BLADDER TUMOR N/A 02/15/2021    Procedure: TRANSURETHRAL RESECTION OF BLADDER TUMOR (TURBT);  Surgeon: Twylla Glendia BROCKS, MD;  Location: [facility] ORS;  Service: Urology;  Laterality: N/A;   TRANSURETHRAL RESECTION OF BLADDER TUMOR WITH MITOMYCIN -C N/A 01/13/2020   Procedure: TRANSURETHRAL RESECTION OF BLADDER TUMOR WITH gemcitabine ;  Surgeon: Twylla Glendia BROCKS, MD;  Location: [facility] ORS;  Service: Urology;  Laterality: N/A;   TRANSURETHRAL RESECTION OF PROSTATE N/A 05/03/2021   Procedure: TRANSURETHRAL RESECTION OF THE PROSTATE (TURP);  Surgeon: Twylla Glendia BROCKS, MD;  Location: [facility] ORS;  Service: Urology;  Laterality: N/A;   VIDEO BRONCHOSCOPY N/A 06/18/2017   Procedure: VIDEO BRONCHOSCOPY;  Surgeon: Army Dallas NOVAK, MD;  Location: [facility] OR;  Service: Thoracic;  Laterality: N/A;    Social History   Socioeconomic History   Marital status: Married    Spouse name: Not on file   Number of children: Not on file   Years of education: Not on file   Highest education level: Not on file  Occupational History   Not on file  Tobacco Use   Smoking status: Never   Smokeless tobacco: Never  Vaping Use   Vaping status: Never Used  Substance and Sexual Activity   Alcohol use: No   Drug use: No   Sexual activity: Not Currently  Other Topics Concern   Not on file  Social History Narrative   Independent at baseline. Ambulatory   Social Drivers of Health   Financial Resource Strain: Low Risk  (12/05/2022)   Received from [facility]   Overall Financial Resource Strain (CARDIA)    Difficulty of Paying Living Expenses: Not hard at all  Food Insecurity: No Food Insecurity (12/05/2022)   Received from [facility]   Hunger Vital Sign    Within the past 12 months, you worried that your food would run out before you got the money to buy more.: Never true    Within the past 12 months, the food you bought just didn't last and you didn't have money to get more.: Never true  Transportation Needs: No  Transportation Needs (12/05/2022)   Received from [facility] - Transportation    In the past 12 months, has lack of transportation kept you from medical appointments or from getting medications?: No    Lack of Transportation (Non-Medical): No  Physical Activity: Not on file  Stress: Not on file  Social Connections: Not on file  Intimate Partner Violence: Not on file    Family History  Problem Relation Age of Onset   Heart disease Father    Diabetes Mother    Hypertension Mother    Heart attack Brother 46   Prostate cancer Neg Hx    Bladder Cancer Neg Hx    Kidney cancer Neg Hx      Current Outpatient Medications:    Calcium Carb-Cholecalciferol 500-10 MG-MCG TABS, Take 1 tablet by mouth daily., Disp: , Rfl:    ERLEADA  60 MG tablet, TAKE 3 TABLETS BY MOUTH EVERY DAY, Disp: 120 tablet, Rfl: 2   Multiple Vitamin (MULTIVITAMIN) tablet, Take 1 tablet by mouth daily., Disp: , Rfl:    ondansetron  (ZOFRAN ) 8 MG tablet, Take 1 tablet (8 mg total) by mouth every 8 (eight) hours as needed for nausea or vomiting., Disp: 45 tablet, Rfl: 2   oxyCODONE  (OXY IR/ROXICODONE ) 5 MG immediate release tablet, Take 1 tablet (5 mg total) by mouth every 4 (four) hours as needed for severe pain (pain score 7-10)., Disp: 90 tablet, Rfl: 0   prochlorperazine  (COMPAZINE ) 10 MG tablet, Take 1 tablet (10 mg total) by mouth every 6 (six) hours as needed for nausea or vomiting., Disp: 30 tablet, Rfl: 2   Acetaminophen  500 MG capsule, Take by mouth. (Patient not taking: Reported on 10/31/2023), Disp: , Rfl:   Physical exam:  Vitals:   10/31/23 1005  BP: 129/86  Pulse: 80  Resp: 16  Temp: (!) 96.2 F (35.7 C)  TempSrc: Tympanic  SpO2: 98%  Weight: 153 lb (69.4 kg)   Physical Exam Cardiovascular:     Rate and Rhythm: Normal rate and regular rhythm.     Heart sounds: Normal heart sounds.  Pulmonary:     Effort: Pulmonary effort is normal.     Breath sounds: Normal breath  sounds.  Abdominal:     General: Bowel sounds are normal.     Palpations: Abdomen is soft.  Skin:    General: Skin is warm and dry.  Neurological:     Mental Status: He is alert and oriented to person, place, and time.      I have personally reviewed labs listed below:    Latest Ref Rng & Units 10/31/2023    9:47 AM  CMP  Glucose 70 - 99 mg/dL 99   BUN 8 - 23 mg/dL 21   Creatinine 9.38 - 1.24 mg/dL 8.80   Sodium 864 - 854 mmol/L 140   Potassium 3.5 - 5.1 mmol/L 4.3   Chloride 98 - 111 mmol/L 110  CO2 22 - 32 mmol/L 23   Calcium 8.9 - 10.3 mg/dL 9.3   Total Protein 6.5 - 8.1 g/dL 6.8   Total Bilirubin 0.0 - 1.2 mg/dL 0.6   Alkaline Phos 38 - 126 U/L 72   AST 15 - 41 U/L 12   ALT 0 - 44 U/L 12       Latest Ref Rng & Units 10/31/2023    9:48 AM  CBC  WBC 4.0 - 10.5 K/uL 4.7   Hemoglobin 13.0 - 17.0 g/dL 86.5   Hematocrit 60.9 - 52.0 % 41.4   Platelets 150 - 400 K/uL 234     Assessment and plan- Patient is a 83 y.o. 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  He is here for routine follow-up prostate cancer  Patient is presently on ADT and apalutamide  which he is tolerating it well without any significant side effects.  He will receive his next dose of Eligard  today.  CBC with differential CMP PSA in 3 months and see me thereafter.  Overall his PSA is trending down and is presently at 0.01 as compared to 10.99 months ago.  I will plan to get CT chest abdomen and pelvis with contrast and bone scan prior to his visit with me.  I will discuss getting bone density scan at my next visit   Visit Diagnosis 1. High risk medication use   2. Prostate cancer (HCC)   3. Encounter for monitoring Lupron  therapy      Dr. Annah Skene, MD, MPH [facility] [phone] 11/01/2023 11:59 AM

## 2023-12-17 ENCOUNTER — Other Ambulatory Visit: Payer: Self-pay

## 2023-12-17 MED ORDER — OXYCODONE HCL 5 MG PO TABS: 5.0000 mg | ORAL_TABLET | ORAL | 0 refills | Status: DC | PRN

## 2023-12-17 NOTE — Telephone Encounter (Signed)
 Patient called requesting refill  to send to [facility] on [facility]xy, states he is only take 3-4 tab a day. Patient requests 120 tab instead of 90, supply due to next appointment being at 3 least 3 weeks away. Please send to [facility]. thanks

## 2023-12-26 ENCOUNTER — Encounter
Admission: RE | Admit: 2023-12-26 | Discharge: 2023-12-26 | Disposition: A | Discharge status: Home / Self Care | Source: Ambulatory Visit | Attending: Oncology | Admitting: Oncology

## 2023-12-26 DIAGNOSIS — C61 Malignant neoplasm of prostate: Secondary | ICD-10-CM | POA: Insufficient documentation

## 2023-12-26 MED ORDER — TECHNETIUM TC 99M MEDRONATE IV KIT
20.0000 | PACK | Freq: Once | INTRAVENOUS | Status: AC | PRN
  Administered 2023-12-26: 21.34 via INTRAVENOUS

## 2024-01-11 ENCOUNTER — Encounter: Payer: Self-pay | Admitting: Oncology

## 2024-01-11 NOTE — Telephone Encounter (Signed)
 Duplicate encounter

## 2024-01-11 NOTE — Telephone Encounter (Signed)
 Encounter opened in error.

## 2024-01-18 ENCOUNTER — Ambulatory Visit
Admission: RE | Admit: 2024-01-18 | Discharge: 2024-01-18 | Disposition: A | Discharge status: Home / Self Care | Source: Ambulatory Visit | Attending: Oncology | Admitting: Oncology

## 2024-01-18 DIAGNOSIS — C61 Malignant neoplasm of prostate: Secondary | ICD-10-CM | POA: Insufficient documentation

## 2024-01-18 MED ORDER — IOHEXOL 300 MG/ML  SOLN
100.0000 mL | Freq: Once | INTRAMUSCULAR | Status: AC | PRN
  Administered 2024-01-18: 100 mL via INTRAVENOUS

## 2024-01-22 ENCOUNTER — Inpatient Hospital Stay: Attending: Oncology | Admitting: Hospice and Palliative Medicine

## 2024-01-22 DIAGNOSIS — Z79891 Long term (current) use of opiate analgesic: Secondary | ICD-10-CM | POA: Insufficient documentation

## 2024-01-22 DIAGNOSIS — G893 Neoplasm related pain (acute) (chronic): Secondary | ICD-10-CM | POA: Diagnosis not present

## 2024-01-22 DIAGNOSIS — Z515 Encounter for palliative care: Secondary | ICD-10-CM | POA: Diagnosis not present

## 2024-01-22 DIAGNOSIS — C61 Malignant neoplasm of prostate: Secondary | ICD-10-CM | POA: Insufficient documentation

## 2024-01-22 DIAGNOSIS — Z8501 Personal history of malignant neoplasm of esophagus: Secondary | ICD-10-CM | POA: Insufficient documentation

## 2024-01-22 DIAGNOSIS — C7951 Secondary malignant neoplasm of bone: Secondary | ICD-10-CM | POA: Insufficient documentation

## 2024-01-22 MED ORDER — OXYCODONE HCL 5 MG PO TABS: 5.0000 mg | ORAL_TABLET | ORAL | 0 refills | Status: DC | PRN

## 2024-01-22 MED ORDER — ONDANSETRON HCL 8 MG PO TABS: 8.0000 mg | ORAL_TABLET | Freq: Three times a day (TID) | ORAL | 2 refills | Status: AC | PRN

## 2024-01-22 NOTE — Progress Notes (Signed)
 Virtual Visit via Telephone Note  I connected with Casey Acevedo on 06/08/23 at  by telephone and verified that I am speaking with the correct person using two identifiers.  Location: Patient: Home Provider: Clinic   I discussed the limitations, risks, security and privacy concerns of performing an evaluation and management service by telephone and the availability of in person appointments. I also discussed with the patient that there may be a patient responsible charge related to this service. The patient expressed understanding and agreed to proceed.   History of Present Illness: Casey Acevedo is a 83 y.o. 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  Patient currently on ADT and apalutamide .  Patient has had decreased quality of life with treatment.  Also has had ongoing pain.  Palliative care consulted to address goals manage ongoing symptoms.    Observations/Objective: I spoke with patient by phone.  Patient reports he is doing well.  Denies significant changes or concerns.  He reports stable pain and is taking oxycodone  2-3 times daily.  He does request refill of oxycodone  today.  Denies any adverse effects.  Patient has follow-up with Dr. Melanee scheduled next week.  CT of the chest abdomen pelvis on 01/18/2024 shows interval enlargement of soft tissue mass adjacent to the left diaphragm concerning for locally recurrent disease.  Patient also had new celiac axis and left retroperitoneal lymph nodes.  Assessment and Plan: Metastatic prostate cancer -on ADT.  Patient sees Dr. Melanee next week  Neoplasm related pain -  Refill oxycodone .  PDMP reviewed.  Daily bowel regimen to prevent opioid-induced constipation.  Follow Up Instructions: Follow-up telephone visit 2 to 3  months   I discussed the assessment and treatment plan with the patient. The patient was provided an opportunity to ask questions and all were answered. The patient agreed with the plan and demonstrated an understanding of the instructions.   The patient was advised to call back or seek an in-person evaluation if the symptoms worsen or if the condition fails to improve as anticipated.  I provided 10 minutes of non-face-to-face time during this encounter.   FONDA JONELLE MOWER, NP

## 2024-02-01 ENCOUNTER — Inpatient Hospital Stay: Admitting: Oncology

## 2024-02-01 ENCOUNTER — Encounter: Payer: Self-pay | Admitting: Oncology

## 2024-02-01 ENCOUNTER — Inpatient Hospital Stay

## 2024-02-01 ENCOUNTER — Telehealth: Payer: Self-pay

## 2024-02-01 ENCOUNTER — Other Ambulatory Visit: Payer: Self-pay

## 2024-02-01 VITALS — BP 115/76 | HR 87 | Temp 95.6°F | Resp 16 | Ht 68.0 in | Wt 151.5 lb

## 2024-02-01 DIAGNOSIS — C7951 Secondary malignant neoplasm of bone: Secondary | ICD-10-CM | POA: Diagnosis present

## 2024-02-01 DIAGNOSIS — C61 Malignant neoplasm of prostate: Secondary | ICD-10-CM

## 2024-02-01 DIAGNOSIS — Z515 Encounter for palliative care: Secondary | ICD-10-CM | POA: Diagnosis not present

## 2024-02-01 DIAGNOSIS — Z8501 Personal history of malignant neoplasm of esophagus: Secondary | ICD-10-CM | POA: Diagnosis not present

## 2024-02-01 DIAGNOSIS — G893 Neoplasm related pain (acute) (chronic): Secondary | ICD-10-CM | POA: Diagnosis not present

## 2024-02-01 DIAGNOSIS — Z79891 Long term (current) use of opiate analgesic: Secondary | ICD-10-CM | POA: Diagnosis not present

## 2024-02-01 LAB — CBC WITH DIFFERENTIAL (CANCER CENTER ONLY)
Abs Immature Granulocytes: 0.02 K/uL (ref 0.00–0.07)
Basophils Absolute: 0.1 K/uL (ref 0.0–0.1)
Basophils Relative: 3 %
Eosinophils Absolute: 0.2 K/uL (ref 0.0–0.5)
Eosinophils Relative: 5 %
HCT: 39.5 % (ref 39.0–52.0)
Hemoglobin: 13 g/dL (ref 13.0–17.0)
Immature Granulocytes: 1 %
Lymphocytes Relative: 26 %
Lymphs Abs: 1.1 K/uL (ref 0.7–4.0)
MCH: 31 pg (ref 26.0–34.0)
MCHC: 32.9 g/dL (ref 30.0–36.0)
MCV: 94 fL (ref 80.0–100.0)
Monocytes Absolute: 0.3 K/uL (ref 0.1–1.0)
Monocytes Relative: 8 %
Neutro Abs: 2.5 K/uL (ref 1.7–7.7)
Neutrophils Relative %: 57 %
Platelet Count: 225 K/uL (ref 150–400)
RBC: 4.2 MIL/uL — ABNORMAL LOW (ref 4.22–5.81)
RDW: 13.5 % (ref 11.5–15.5)
WBC Count: 4.3 K/uL (ref 4.0–10.5)
nRBC: 0 % (ref 0.0–0.2)

## 2024-02-01 LAB — CMP (CANCER CENTER ONLY)
ALT: 12 U/L (ref 0–44)
AST: 14 U/L — ABNORMAL LOW (ref 15–41)
Albumin: 4 g/dL (ref 3.5–5.0)
Alkaline Phosphatase: 79 U/L (ref 38–126)
Anion gap: 11 (ref 5–15)
BUN: 21 mg/dL (ref 8–23)
CO2: 21 mmol/L — ABNORMAL LOW (ref 22–32)
Calcium: 8.8 mg/dL — ABNORMAL LOW (ref 8.9–10.3)
Chloride: 108 mmol/L (ref 98–111)
Creatinine: 1.47 mg/dL — ABNORMAL HIGH (ref 0.61–1.24)
GFR, Estimated: 47 mL/min — ABNORMAL LOW (ref >60)
Glucose, Bld: 148 mg/dL — ABNORMAL HIGH (ref 70–99)
Potassium: 4.4 mmol/L (ref 3.5–5.1)
Sodium: 140 mmol/L (ref 135–145)
Total Bilirubin: 0.4 mg/dL (ref 0.0–1.2)
Total Protein: 6.7 g/dL (ref 6.5–8.1)

## 2024-02-01 LAB — PSA: Prostatic Specific Antigen: <0.02 ng/mL (ref 0.00–4.00)

## 2024-02-01 NOTE — Progress Notes (Signed)
 CT CAP 01/18/24 AND BONE SCAN 12/26/23.

## 2024-02-01 NOTE — Telephone Encounter (Signed)
 Per Dr. Melanee  patient in need of PET skull to mid thigh ASAP prior to EUS bx of upper abdominal mass. Outbound call to patient and informed PET is needed; patient indicated he will be out of town Mon 11/24 - Sat 11/29; patient also mentioned he has an appointment on 12/1 and has to take his sister in law to the doctor on 12/3 so 12/1 & 12/3 will not work.  Informed someone will be touch next week with a date for PET; patient confirmed okay to contact next week while on vacation.

## 2024-02-02 LAB — CEA: CEA: 10.2 ng/mL — ABNORMAL HIGH (ref 0.0–4.7)

## 2024-02-08 ENCOUNTER — Other Ambulatory Visit: Payer: Self-pay | Admitting: Oncology

## 2024-02-08 DIAGNOSIS — C61 Malignant neoplasm of prostate: Secondary | ICD-10-CM

## 2024-02-11 ENCOUNTER — Encounter: Payer: Self-pay | Admitting: Oncology

## 2024-02-11 ENCOUNTER — Encounter (INDEPENDENT_AMBULATORY_CARE_PROVIDER_SITE_OTHER): Payer: Medicare Other | Admitting: Ophthalmology

## 2024-02-11 DIAGNOSIS — H43813 Vitreous degeneration, bilateral: Secondary | ICD-10-CM | POA: Diagnosis not present

## 2024-02-11 DIAGNOSIS — H35373 Puckering of macula, bilateral: Secondary | ICD-10-CM

## 2024-02-11 DIAGNOSIS — D3131 Benign neoplasm of right choroid: Secondary | ICD-10-CM

## 2024-02-11 NOTE — Progress Notes (Signed)
 Hematology/Oncology Consult note [facility]  [phone] [phone]  Patient Care Team: Auston Reyes BIRCH, MD as PCP - General (Internal Medicine) End, Lonni, MD as PCP - Cardiology (Cardiology) Army Dallas NOVAK, MD (Inactive) as Consulting Physician (Cardiothoracic Surgery) Melanee Annah BROCKS, MD as Consulting Physician (Oncology) Lenn Aran, MD as Referring Physician (Radiation Oncology)   Name of the patient: Casey Acevedo  [id number]  [birth year]   Date of visit: 02/11/24  Diagnosis- 1.  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  Metastatic castrate sensitive prostate cancer  Chief complaint/ Reason for visit-discuss CT scan results and further management  Heme/Onc history: Patient is a 83 year old male 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 Pathology showed no residual carcinoma at primary site. 1/5 LN positive for malignancy. 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  Patient currently remains in remission    Patient was diagnosed with high-grade prostate cancer in 2021.  Initial PSA was 14.  It was a stage IIIc adenocarcinoma with a Gleason's 9.  He received IMRT radiation therapy following which his PSA declined to 0.05.  He received 1 dose of Lupron  in April 2023 but none after that.  His PSA was 0.05 a year ago and then went up to 0.226 months ago and is presently at 10.9. CT chest abdomen and pelvis with contrast showed multifocal sclerotic osseous foci along with enlarged retroperitoneal and hiatal lymph nodes consistent with malignancy.  Bone scan also showed abnormal radiotracer uptake involving  calvarium spine ribs right clavicle and pelvic girdle.   Patient is currently on ADT and was started on apalutamide  in December 2024    Interval history-patient currently feels well and is compliant with his apalutamide .  He denies any complaints at this time.  Appetite and weight have remained stable.  Pain is currently well controlled with as needed oxycodone   ECOG PS- 1 Pain scale- 0   Review of systems- Review of Systems  Constitutional:  Negative for chills, fever, malaise/fatigue and weight loss.  HENT:  Negative for congestion, ear discharge and nosebleeds.   Eyes:  Negative for blurred vision.  Respiratory:  Negative for cough, hemoptysis, sputum production, shortness of breath and wheezing.   Cardiovascular:  Negative for chest pain, palpitations, orthopnea and claudication.  Gastrointestinal:  Negative for abdominal pain, blood in stool, constipation, diarrhea, heartburn, melena, nausea and vomiting.  Genitourinary:  Negative for dysuria, flank pain, frequency, hematuria and urgency.  Musculoskeletal:  Negative for back pain, joint pain and myalgias.  Skin:  Negative for rash.  Neurological:  Negative for dizziness, tingling, focal weakness, seizures, weakness and headaches.  Endo/Heme/Allergies:  Does not bruise/bleed easily.  Psychiatric/Behavioral:  Negative for depression and suicidal ideas. The patient does not have insomnia.       Allergies  Allergen Reactions   Tramadol  Other (See Comments)    Hallucination Pt states that it made crazy.     Past Medical History:  Diagnosis Date   Aortic atherosclerosis    Arthritis    Bladder tumor    Compression fracture of thoracic spine, non-traumatic (HCC) 01/28/2018   Coronary artery calcification    Dysphagia    Elevated liver enzymes    Esophageal cancer (HCC) 2018   Chemo + Rad tx's with  surgical resection.    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 (HCC) 06/05/2017     Past Surgical History:   Procedure Laterality Date   CHOLECYSTECTOMY     COLONOSCOPY     COMPLETE ESOPHAGECTOMY N/A 06/18/2017   Procedure: cervical ESOPHAGECTOMY COMPLETE;  Surgeon: Army Dallas NOVAK, MD;  Location: [facility] OR;  Service: Thoracic;  Laterality: N/A;  NEED NIMS ET TUBE   ESOPHAGOGASTRODUODENOSCOPY (EGD) WITH PROPOFOL  N/A 12/07/2016   Procedure: ESOPHAGOGASTRODUODENOSCOPY (EGD) WITH PROPOFOL ;  Surgeon: Therisa Bi, MD;  Location: [facility] ENDOSCOPY;  Service: Gastroenterology;  Laterality: N/A;   ESOPHAGOGASTRODUODENOSCOPY (EGD) WITH PROPOFOL  N/A 12/26/2016   Procedure: ESOPHAGOGASTRODUODENOSCOPY (EGD) WITH PROPOFOL  WITH DILATION;  Surgeon: Therisa Bi, MD;  Location: [facility] ENDOSCOPY;  Service: Gastroenterology;  Laterality: N/A;   EYE SURGERY Bilateral    Cataracts   GASTROJEJUNOSTOMY N/A 01/16/2017   Procedure: LAPROSCOPIC ASSIST FEEDING JEJUNOSTOMY TUBE;  Surgeon: Gladis Cough, MD;  Location: [facility] ORS;  Service: General;  Laterality: N/A;   IR FLUORO GUIDE PORT INSERTION RIGHT  01/10/2017   IR REMOVAL TUN ACCESS W/ PORT W/O FL MOD SED  03/22/2018   IR REPLACE G-TUBE SIMPLE WO FLUORO  03/15/2017   IR REPLC DUODEN/JEJUNO TUBE PERCUT W/FLUORO  03/05/2017   KYPHOPLASTY N/A 08/31/2020   Procedure: L1 KYPHOPLASTY;  Surgeon: Kathlynn Sharper, MD;  Location: [facility] ORS;  Service: Orthopedics;  Laterality: N/A;   KYPHOPLASTY N/A 10/05/2020   Procedure: T12 KYPHOPLASTY;  Surgeon: Kathlynn Sharper, MD;  Location: [facility] ORS;  Service: Orthopedics;  Laterality: N/A;   PICC LINE INSERTION Right    PROSTATE BIOPSY N/A 05/03/2021   Procedure: PROSTATE BIOPSY;  Surgeon: Twylla Glendia BROCKS, MD;  Location: [facility] ORS;  Service: Urology;  Laterality: N/A;   PYLOROMYOTOMY N/A 06/18/2017   Procedure: JUDITHE;  Surgeon: Army Dallas NOVAK, MD;  Location: [facility] OR;  Service: Thoracic;  Laterality: N/A;   TRANSRECTAL ULTRASOUND N/A 05/03/2021   Procedure: TRANSRECTAL ULTRASOUND;  Surgeon: Twylla Glendia BROCKS, MD;  Location: [facility] ORS;   Service: Urology;  Laterality: N/A;   TRANSURETHRAL RESECTION OF BLADDER TUMOR N/A 02/15/2021   Procedure: TRANSURETHRAL RESECTION OF BLADDER TUMOR (TURBT);  Surgeon: Twylla Glendia BROCKS, MD;  Location: [facility] ORS;  Service: Urology;  Laterality: N/A;   TRANSURETHRAL RESECTION OF BLADDER TUMOR WITH MITOMYCIN -C N/A 01/13/2020   Procedure: TRANSURETHRAL RESECTION OF BLADDER TUMOR WITH gemcitabine ;  Surgeon: Twylla Glendia BROCKS, MD;  Location: [facility] ORS;  Service: Urology;  Laterality: N/A;   TRANSURETHRAL RESECTION OF PROSTATE N/A 05/03/2021   Procedure: TRANSURETHRAL RESECTION OF THE PROSTATE (TURP);  Surgeon: Twylla Glendia BROCKS, MD;  Location: [facility] ORS;  Service: Urology;  Laterality: N/A;   VIDEO BRONCHOSCOPY N/A 06/18/2017   Procedure: VIDEO BRONCHOSCOPY;  Surgeon: Army Dallas NOVAK, MD;  Location: [facility] OR;  Service: Thoracic;  Laterality: N/A;    Social History   Socioeconomic History   Marital status: Married    Spouse name: Not on file   Number of children: Not on file   Years of education: Not on file   Highest education level: Not on file  Occupational History   Not on file  Tobacco Use   Smoking status: Never   Smokeless tobacco: Never  Vaping Use   Vaping status: Never Used  Substance and Sexual Activity   Alcohol use: No   Drug use: No   Sexual activity: Not Currently  Other Topics Concern   Not on file  Social History Narrative   Independent at baseline. Ambulatory   Social Drivers  of Health   Financial Resource Strain: Low Risk  (12/06/2023)   Received from [facility]   Overall Financial Resource Strain (CARDIA)    Difficulty of Paying Living Expenses: Not hard at all  Food Insecurity: No Food Insecurity (12/06/2023)   Received from [facility]   Hunger Vital Sign    Within the past 12 months, you worried that your food would run out before you got the money to buy more.: Never true    Within the past 12 months, the food you bought just  didn't last and you didn't have money to get more.: Never true  Transportation Needs: No Transportation Needs (12/06/2023)   Received from [facility] - Transportation    In the past 12 months, has lack of transportation kept you from medical appointments or from getting medications?: No    Lack of Transportation (Non-Medical): No  Physical Activity: Not on file  Stress: Not on file  Social Connections: Not on file  Intimate Partner Violence: Not on file    Family History  Problem Relation Age of Onset   Heart disease Father    Diabetes Mother    Hypertension Mother    Heart attack Brother 79   Prostate cancer Neg Hx    Bladder Cancer Neg Hx    Kidney cancer Neg Hx      Current Outpatient Medications:    Calcium Carb-Cholecalciferol 500-10 MG-MCG TABS, Take 1 tablet by mouth daily., Disp: , Rfl:    Multiple Vitamin (MULTIVITAMIN) tablet, Take 1 tablet by mouth daily., Disp: , Rfl:    ondansetron  (ZOFRAN ) 8 MG tablet, Take 1 tablet (8 mg total) by mouth every 8 (eight) hours as needed for nausea or vomiting., Disp: 60 tablet, Rfl: 2   oxyCODONE  (OXY IR/ROXICODONE ) 5 MG immediate release tablet, Take 1 tablet (5 mg total) by mouth every 4 (four) hours as needed for severe pain (pain score 7-10)., Disp: 120 tablet, Rfl: 0   prochlorperazine  (COMPAZINE ) 10 MG tablet, Take 1 tablet (10 mg total) by mouth every 6 (six) hours as needed for nausea or vomiting., Disp: 30 tablet, Rfl: 2   Acetaminophen  500 MG capsule, Take by mouth. (Patient not taking: Reported on 02/01/2024), Disp: , Rfl:    ERLEADA  60 MG tablet, TAKE 3 TABLETS BY MOUTH EVERY DAY, Disp: 120 tablet, Rfl: 2  Physical exam:  Vitals:   02/01/24 0922  BP: 115/76  Pulse: 87  Resp: 16  Temp: (!) 95.6 F (35.3 C)  TempSrc: Tympanic  SpO2: 99%  Weight: 151 lb 8 oz (68.7 kg)  Height: 5' 8 (1.727 m)   Physical Exam Cardiovascular:     Rate and Rhythm: Normal rate and regular rhythm.     Heart  sounds: Normal heart sounds.  Pulmonary:     Effort: Pulmonary effort is normal.     Breath sounds: Normal breath sounds.  Skin:    General: Skin is warm and dry.  Neurological:     Mental Status: He is alert and oriented to person, place, and time.      I have personally reviewed labs listed below:    Latest Ref Rng & Units 02/01/2024    9:10 AM  CMP  Glucose 70 - 99 mg/dL 851   BUN 8 - 23 mg/dL 21   Creatinine 9.38 - 1.24 mg/dL 8.52   Sodium 864 - 854 mmol/L 140   Potassium 3.5 - 5.1 mmol/L 4.4  Chloride 98 - 111 mmol/L 108   CO2 22 - 32 mmol/L 21   Calcium 8.9 - 10.3 mg/dL 8.8   Total Protein 6.5 - 8.1 g/dL 6.7   Total Bilirubin 0.0 - 1.2 mg/dL 0.4   Alkaline Phos 38 - 126 U/L 79   AST 15 - 41 U/L 14   ALT 0 - 44 U/L 12       Latest Ref Rng & Units 02/01/2024    9:10 AM  CBC  WBC 4.0 - 10.5 K/uL 4.3   Hemoglobin 13.0 - 17.0 g/dL 86.9   Hematocrit 60.9 - 52.0 % 39.5   Platelets 150 - 400 K/uL 225    I have personally reviewed Radiology images listed below: No images are attached to the encounter.  CT CHEST ABDOMEN PELVIS W CONTRAST Result Date: 01/22/2024 CLINICAL DATA:  Prostate cancer, additional history of esophageal cancer status post pull-through esophagectomy * Tracking Code: BO * EXAM: CT CHEST, ABDOMEN, AND PELVIS WITH CONTRAST TECHNIQUE: Multidetector CT imaging of the chest, abdomen and pelvis was performed following the standard protocol during bolus administration of intravenous contrast. RADIATION DOSE REDUCTION: This exam was performed according to the departmental dose-optimization program which includes automated exposure control, adjustment of the mA and/or kV according to patient size and/or use of iterative reconstruction technique. CONTRAST:  OMNIPAQUE  IOHEXOL  300 MG/ML  SOLN COMPARISON:  CT chest abdomen pelvis, 02/02/2023 FINDINGS: CT CHEST FINDINGS Cardiovascular: Aortic atherosclerosis. Aortic valve calcifications. Normal heart size.  Three-vessel coronary artery calcifications. No pericardial effusion. Mediastinum/Nodes: No enlarged mediastinal, hilar, or axillary lymph nodes. Small calcified right hilar lymph nodes. Pull-through esophagectomy. Unchanged, expected appearance of the superior anastomosis (series 2, image 16). Thyroid gland, trachea, and esophagus demonstrate no significant findings. Lungs/Pleura: Mild bibasilar predominant fibrosis, unchanged. New noncalcified nodule of the peripheral right upper lobe measuring 0.5 cm (series 4, image 47). Multiple small unchanged calcified nodules of the medial right upper lobe. No pleural effusion or pneumothorax. Musculoskeletal: No chest wall abnormality. No acute osseous findings. CT ABDOMEN PELVIS FINDINGS Hepatobiliary: No focal liver abnormality is seen. Status post cholecystectomy. No biliary dilatation. Pancreas: Unremarkable. No pancreatic ductal dilatation or surrounding inflammatory changes. Spleen: Normal in size without significant abnormality. Adrenals/Urinary Tract: Adrenal glands are unremarkable. Multiple small nonobstructive bilateral renal calculi. No ureteral calculi or hydronephrosis. Bladder is unremarkable. Stomach/Bowel: Pull-through esophagectomy. Significant interval enlargement of a soft tissue mass about suture adjacent to the left diaphragmatic crus, on today's examination measuring 3.6 x 2.8 cm, previously no greater than 2.0 x 1.4 cm (series 2, image 54). Appendix appears normal. No evidence of bowel wall thickening, distention, or inflammatory changes. Vascular/Lymphatic: Aortic atherosclerosis. New, abnormally enlarged celiac axis lymph nodes measuring up 2 1.1 x 0.9 cm (series 2, image 57). New, normally enlarged left retroperitoneal node measuring 1.3 x 1.2 cm (series 2, image 64). Numerous previously enlarged aortocaval and lower left retroperitoneal lymph nodes are however completely resolved on today's examination (series 2, image 71). Reproductive: Marking  clips and fiducials in the prostate. Other: No abdominal wall hernia or abnormality. No ascites. Musculoskeletal: No acute osseous findings. Osteopenia. Unchanged wedge deformities of T11, T12, L1, status post vertebral cement augmentation of T12 and L1. New, although age indeterminate wedge and endplate deformities of T6, L2 and L3 (series 6, image 62). Unchanged sclerotic osseous metastases, including of the right pubis (series 2, image 116) and left ilium (series 2, image 89). IMPRESSION: 1. Significant interval enlargement of a soft tissue mass about suture adjacent  to the left diaphragmatic crus, on today's examination measuring 3.6 x 2.8 cm, previously no greater than 2.0 x 1.4 cm. This is consistent with locally recurrent disease, presumably esophageal malignancy. 2. New, abnormally enlarged celiac axis and left retroperitoneal lymph nodes, consistent with worsened nodal metastatic disease. 3. Numerous previously enlarged aortocaval and lower left retroperitoneal lymph nodes are however completely resolved on today's examination, consistent with treatment response of nodal metastatic disease, this presumably reflecting treatment response of prostate malignancy. 4. New noncalcified nodule of the peripheral right upper lobe measuring 0.5 cm, nonspecific although highly suspicious for a small metastasis in this setting. 5. Unchanged sclerotic osseous metastases. 6. New, although age indeterminate wedge and endplate deformities of T6, L2 and L3. Unchanged wedge deformities of T11, T12, L1, status post vertebral cement augmentation of T12 and L1. 7. Coronary artery disease. Aortic Atherosclerosis (ICD10-I70.0). Electronically Signed   By: Marolyn JONETTA Jaksch M.D.   On: 01/22/2024 11:58     Assessment and plan- Patient is a 83 y.o. male with prior history of esophageal cancer s/p concurrent chemoradiation followed by total esophagectomy in 2019.  Presently being treated for metastatic castrate sensitive prostate  cancer on ADT plus apalutamide  here for routine follow-up  Assessment and Plan    Suspected recurrence or progression of malignancy (prostate cancer versus esophageal cancer) in abdomen and lymph nodes  I have reviewed CT chest abdomen pelvis images independently and discussed findings with the patient which shows soft tissue mass at the diaphragmatic crus measuring 3.6 cm.  New abnormally enlarged celiac axis and retroperitoneal lymph nodes consistent with worsening nodal metastatic disease as well as enlarged aortocaval and left retroperitoneal lymph nodes which were previously enlarged but now have completely resolved presumably reflecting treatment response of prostate malignancy  . Differential diagnosis includes recurrence of prostate cancer versus esophageal cancer. Previous prostate cancer treatment was effective, but current findings require further investigation to determine the nature of the malignancy. - Schedule biopsy of lymph nodes within 1-2 weeks to determine nature of malignancy.  CEA and PSA from today are currently pending.  I am also planning to get a PET CT scan prior to his biopsy after discussion with radiology - Continue current prostate cancer medication regimen.  Metastatic prostate cancer to lymph nodes and bones Prostate cancer previously treated with radiation and confirmed by biopsy in 2023. PSA levels well-controlled with medication, last recorded PSA at 0.01 in August 2025. Current findings of new disease require further investigation to determine if related to prostate cancer. - Continue current prostate cancer medication regimen.  History of esophageal cancer, status post resection (2019) Esophageal cancer treated with surgery in April 2019. Current findings of a mass near the diaphragmatic crus raise concern for possible recurrence, but further investigation is needed to confirm. - Await biopsy results to determine if mass is related to esophageal cancer.          Visit Diagnosis 1. Prostate cancer [facility])      Dr. Annah Skene, MD, MPH [facility] [phone] 02/11/2024 9:03 AM

## 2024-02-12 ENCOUNTER — Ambulatory Visit
Admission: RE | Admit: 2024-02-12 | Discharge: 2024-02-12 | Disposition: A | Discharge status: Home / Self Care | Source: Ambulatory Visit | Attending: Oncology | Admitting: Oncology

## 2024-02-12 ENCOUNTER — Other Ambulatory Visit: Payer: Self-pay

## 2024-02-12 DIAGNOSIS — K449 Diaphragmatic hernia without obstruction or gangrene: Secondary | ICD-10-CM | POA: Insufficient documentation

## 2024-02-12 DIAGNOSIS — N200 Calculus of kidney: Secondary | ICD-10-CM | POA: Diagnosis not present

## 2024-02-12 DIAGNOSIS — R911 Solitary pulmonary nodule: Secondary | ICD-10-CM | POA: Insufficient documentation

## 2024-02-12 DIAGNOSIS — I6523 Occlusion and stenosis of bilateral carotid arteries: Secondary | ICD-10-CM | POA: Diagnosis not present

## 2024-02-12 DIAGNOSIS — Z9049 Acquired absence of other specified parts of digestive tract: Secondary | ICD-10-CM | POA: Insufficient documentation

## 2024-02-12 DIAGNOSIS — C787 Secondary malignant neoplasm of liver and intrahepatic bile duct: Secondary | ICD-10-CM | POA: Diagnosis not present

## 2024-02-12 DIAGNOSIS — C61 Malignant neoplasm of prostate: Secondary | ICD-10-CM | POA: Diagnosis not present

## 2024-02-12 DIAGNOSIS — I700 Atherosclerosis of aorta: Secondary | ICD-10-CM | POA: Insufficient documentation

## 2024-02-12 LAB — GLUCOSE, CAPILLARY: Glucose-Capillary: 100 mg/dL — ABNORMAL HIGH (ref 70–99)

## 2024-02-12 MED ORDER — FLUDEOXYGLUCOSE F - 18 (FDG) INJECTION
8.2100 | Freq: Once | INTRAVENOUS | Status: AC | PRN
  Administered 2024-02-12: 8.21 via INTRAVENOUS

## 2024-02-13 ENCOUNTER — Ambulatory Visit

## 2024-02-13 ENCOUNTER — Telehealth: Payer: Self-pay

## 2024-02-13 NOTE — Telephone Encounter (Signed)
 Patient in need of ultrasound guided liver biopsy ASAP.  Per Clarita Walterine Bence, I can put him on Thurs 12/11 at 10a an arrive at 9a..  Patient agreed to above date / time; informed NPO after midnight, would need a driver present and will report to H&V.  Also reviewed patient will receive call from [place] one day prior to discuss same details; patient verbalized understanding.  Patient stated he has an appointment with Dr. Melanee on Fri 12/12; per Dr. Melanee scheduling to push appointment out to 12/16 ish.

## 2024-02-13 NOTE — Progress Notes (Signed)
 Karalee Wilkie POUR, MD sent to Carlie Clarita RAMAN Approved for US  guided core biopsy of LIVER LESION.  Several seen on PET, very subtle on CT.  Hx esophageal AND prostate cancer.  Moderate sedation  HKM

## 2024-02-20 ENCOUNTER — Inpatient Hospital Stay: Admitting: Oncology

## 2024-02-20 ENCOUNTER — Other Ambulatory Visit (HOSPITAL_COMMUNITY): Payer: Self-pay

## 2024-02-20 ENCOUNTER — Other Ambulatory Visit: Payer: Self-pay | Admitting: Radiology

## 2024-02-20 ENCOUNTER — Telehealth: Payer: Self-pay | Admitting: Pharmacy Technician

## 2024-02-20 DIAGNOSIS — C61 Malignant neoplasm of prostate: Secondary | ICD-10-CM

## 2024-02-20 NOTE — Telephone Encounter (Signed)
 Oral Oncology Patient Advocate Encounter   Began application for assistance for Erleada  through [facility] & [facility] Patient [facility].   Application will be submitted upon completion of necessary supporting documentation.   [employer] phone number [phone].   Waiting on MD/patient signatures.   Mitsugi Schrader ([name]) [name], [name]  [facility], [name], [facility] - Oral Chemotherapy Patient Advocate Specialist III Phone: [phone]  Fax: [phone]

## 2024-02-20 NOTE — Progress Notes (Signed)
 Patient for US  guided Liver Biopsy on Thurs 02/21/24, I LVM for the patient on the phone and gave pre-procedure instructions. VM made patient aware to be here at 9a, NPO after MN prior to procedure as well as driver post procedure/recovery/discharge. Called 02/20/24

## 2024-02-21 ENCOUNTER — Telehealth: Payer: Self-pay

## 2024-02-21 ENCOUNTER — Ambulatory Visit
Admission: RE | Admit: 2024-02-21 | Discharge: 2024-02-21 | Disposition: A | Discharge status: Home / Self Care | Source: Ambulatory Visit | Attending: Oncology | Admitting: Oncology

## 2024-02-21 ENCOUNTER — Other Ambulatory Visit: Payer: Self-pay

## 2024-02-21 DIAGNOSIS — C159 Malignant neoplasm of esophagus, unspecified: Secondary | ICD-10-CM | POA: Insufficient documentation

## 2024-02-21 DIAGNOSIS — C61 Malignant neoplasm of prostate: Secondary | ICD-10-CM | POA: Diagnosis present

## 2024-02-21 DIAGNOSIS — Z9221 Personal history of antineoplastic chemotherapy: Secondary | ICD-10-CM | POA: Insufficient documentation

## 2024-02-21 DIAGNOSIS — Z923 Personal history of irradiation: Secondary | ICD-10-CM | POA: Insufficient documentation

## 2024-02-21 DIAGNOSIS — Z5309 Procedure and treatment not carried out because of other contraindication: Secondary | ICD-10-CM | POA: Insufficient documentation

## 2024-02-21 DIAGNOSIS — Z7982 Long term (current) use of aspirin: Secondary | ICD-10-CM | POA: Insufficient documentation

## 2024-02-21 LAB — CBC
HCT: 43.4 % (ref 39.0–52.0)
Hemoglobin: 13.8 g/dL (ref 13.0–17.0)
MCH: 30.4 pg (ref 26.0–34.0)
MCHC: 31.8 g/dL (ref 30.0–36.0)
MCV: 95.6 fL (ref 80.0–100.0)
Platelets: 258 K/uL (ref 150–400)
RBC: 4.54 MIL/uL (ref 4.22–5.81)
RDW: 13.3 % (ref 11.5–15.5)
WBC: 5.5 K/uL (ref 4.0–10.5)
nRBC: 0 % (ref 0.0–0.2)

## 2024-02-21 LAB — PROTIME-INR
INR: 0.9 (ref 0.8–1.2)
Prothrombin Time: 13.1 s (ref 11.4–15.2)

## 2024-02-21 MED ORDER — SODIUM CHLORIDE 0.9 % IV SOLN: INTRAVENOUS | Status: DC

## 2024-02-21 MED ORDER — FENTANYL CITRATE (PF) 100 MCG/2ML IJ SOLN
INTRAMUSCULAR | Status: AC
  Filled 2024-02-21: qty 2

## 2024-02-21 MED ORDER — MIDAZOLAM HCL 2 MG/2ML IJ SOLN
INTRAMUSCULAR | Status: AC
  Filled 2024-02-21: qty 2

## 2024-02-21 NOTE — Telephone Encounter (Signed)
 Returned patient's phone call regarding missing his liver biopsy appointment. After reviewing patient's chart, radiology team documented that patient had taken Aspirin the day before procedure. Patient was suppose to hold ASA times 5 days prior to liver biopsy. As a result, biopsy will need to be rescheduled for one day next week as well as appointment with Dr. Melanee.  Patient verbalized confirmation to not take ASA 5 days prior to biopsy and to hold further dosing at this time. Patient admitted last does taken was yesterday at 9:00AM.

## 2024-02-21 NOTE — Progress Notes (Signed)
 Procedure cancelled.  Patient had taken aspirin 650mg  yesterday

## 2024-02-21 NOTE — Plan of Care (Signed)
 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 Patient recently underwent CT C/A/P on 11/7 for follow up which revealed concern for local recurrent disease of the esophagus as well as concern for newly enlarged retroperitoneal lymph nodes, consistent with metastatic disease. Follow up PET scan on 12/2 revealed hypermetabolic activity of the soft tissue mass posterior to the gastric pull through as well as new widespread hepatic metastatic disease, new right upper lobe nodule concerning for metastasis, and hypermetabolic activity in the supraglottic region. IR subsequently consulted for liver lesion biopsy for diagnostic confirmation.   Patient presented today for his biopsy; however, after pre-procedure discussion reports taking 325mg  of ASA for the past several years with his last dose being yesterday, 12/10. Thorough chart review reveals no previous documentation of Aspirin as a current medication. Patient reports he does not consider Aspirin a blood thinner and never thought to mention it before. Discussed with patient that we typically request Aspirin be held for 5 days prior to a liver biopsy and his procedure would need to be rescheduled. Patient is understandably frustrated that procedure has to be rescheduled, but is also agreeable.   Discussed with patient holding his Aspirin intake for the next 5-6 days and we will try to get him rescheduled for early next week. Patient verbalized understanding.   Please reach out to IR with any additional questions or concerns.   Electronically Signed: Remigio Mcmillon M Railyn House, PA-C 02/21/2024, 10:43 AM

## 2024-02-22 ENCOUNTER — Inpatient Hospital Stay: Admitting: Oncology

## 2024-02-25 ENCOUNTER — Encounter: Payer: Self-pay | Admitting: Oncology

## 2024-02-25 ENCOUNTER — Other Ambulatory Visit: Payer: Self-pay | Admitting: Radiology

## 2024-02-25 DIAGNOSIS — K769 Liver disease, unspecified: Secondary | ICD-10-CM

## 2024-02-25 NOTE — Telephone Encounter (Signed)
 Regarding reschedule of liver biopsy patient can be added back on for tomorrow morning - to be here at 8:30a for a 9:30a procedure. Patient will continue to hold ASA 325mg  again. Outbound call to patient; patient agreed to above time & date; reminded npo after midnight and to bring a driver.  Patient confirmed he is not taking ASA and will continue to hold.

## 2024-02-25 NOTE — Telephone Encounter (Signed)
 Oral Oncology Patient Advocate Encounter   Submitted application for assistance for Erleada  to [facility] Patient [facility].   Application submitted via e-fax to [phone]   [employer] phone number [phone].   I will continue to check the status until final determination.   Wynema Garoutte ([name]) [name], [name]  [facility], [name], [facility] - Oral Chemotherapy Patient Advocate Specialist III Phone: [phone]  Fax: [phone]

## 2024-02-26 ENCOUNTER — Inpatient Hospital Stay: Admitting: Oncology

## 2024-02-26 ENCOUNTER — Ambulatory Visit: Admission: RE | Admit: 2024-02-26 | Discharge: 2024-02-26 | Attending: Oncology

## 2024-02-26 DIAGNOSIS — K769 Liver disease, unspecified: Secondary | ICD-10-CM | POA: Diagnosis not present

## 2024-02-26 DIAGNOSIS — C61 Malignant neoplasm of prostate: Secondary | ICD-10-CM | POA: Diagnosis not present

## 2024-02-26 DIAGNOSIS — Z7982 Long term (current) use of aspirin: Secondary | ICD-10-CM | POA: Diagnosis not present

## 2024-02-26 DIAGNOSIS — Z01818 Encounter for other preprocedural examination: Secondary | ICD-10-CM | POA: Diagnosis present

## 2024-02-26 DIAGNOSIS — Z91148 Patient's other noncompliance with medication regimen for other reason: Secondary | ICD-10-CM | POA: Diagnosis not present

## 2024-02-26 DIAGNOSIS — C787 Secondary malignant neoplasm of liver and intrahepatic bile duct: Secondary | ICD-10-CM | POA: Diagnosis not present

## 2024-02-26 LAB — CBC
HCT: 42.9 % (ref 39.0–52.0)
Hemoglobin: 13.9 g/dL (ref 13.0–17.0)
MCH: 30.7 pg (ref 26.0–34.0)
MCHC: 32.4 g/dL (ref 30.0–36.0)
MCV: 94.7 fL (ref 80.0–100.0)
Platelets: 250 K/uL (ref 150–400)
RBC: 4.53 MIL/uL (ref 4.22–5.81)
RDW: 13.2 % (ref 11.5–15.5)
WBC: 5 K/uL (ref 4.0–10.5)
nRBC: 0 % (ref 0.0–0.2)

## 2024-02-26 LAB — PROTIME-INR
INR: 0.9 (ref 0.8–1.2)
Prothrombin Time: 12.9 s (ref 11.4–15.2)

## 2024-02-26 MED ORDER — FENTANYL CITRATE (PF) 100 MCG/2ML IJ SOLN
INTRAMUSCULAR | Status: AC | PRN
  Administered 2024-02-26: 10:00:00 50 ug via INTRAVENOUS

## 2024-02-26 MED ORDER — MIDAZOLAM HCL (PF) 2 MG/2ML IJ SOLN
INTRAMUSCULAR | Status: AC | PRN
  Administered 2024-02-26: 10:00:00 1 mg via INTRAVENOUS

## 2024-02-26 MED ORDER — FENTANYL CITRATE (PF) 100 MCG/2ML IJ SOLN
INTRAMUSCULAR | Status: AC
  Filled 2024-02-26: qty 2

## 2024-02-26 MED ORDER — MIDAZOLAM HCL 2 MG/2ML IJ SOLN
INTRAMUSCULAR | Status: AC
  Filled 2024-02-26: qty 2

## 2024-02-26 MED ORDER — SODIUM CHLORIDE 0.9 % IV SOLN: INTRAVENOUS | Status: DC

## 2024-02-26 NOTE — Progress Notes (Signed)
 Patient clinically stable post US  liver biopsy, denies complaints of pain immediately post procedure. Received Versed  1 mg along with Fentanyl  50 mcg IV for procedure. Report given to Ruthanna Kitty RN post procedure./specials/19

## 2024-02-26 NOTE — Procedures (Signed)
 Interventional Radiology Procedure Note  Procedure: US Guided Biopsy of liver lesion  Complications: None  Estimated Blood Loss: < 10 mL  Findings: 18 G core biopsy of right lobe liver lesion performed under US guidance.  Three core samples obtained and sent to Pathology.  Jodi Marble. Fredia Sorrow, M.D Pager:  [phone]

## 2024-02-26 NOTE — H&P (Signed)
 Chief Complaint: Liver Lesion. Request is for liver lesion biopsy  Referring Physician(s): Rao,Archana C  Supervising Physician: Luverne Aran  Patient Status: [facility] - Out-pt  History of Present Illness: Casey Acevedo is a 83 y.o. male  outpatient. 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 Found to have recurrence on CT scan. PET scan 12.2.25 shows widespread liver mets. PET scan reads New widespread hepatic metastatic disease with multiple ill-defined hypoattenuating liver lesions with associated hypermetabolic activity. Representative lesions include a 2.2 cm lesion anteriorly in the dome of the right lobe (segment 8) with an SUV max of 7.6 (image 73/6), a 2.2 cm lesion in segment 7 with an SUV max of 6.5 (image 74/6), and a 1.7 cm lesion in segment 2 with an SUV max of 6.4 (image 83/6). Team is requesting a liver lesion biopsy. Case approved by IR Attending Dr. Wilkie Lent.   Wife at bedside. Currently without any significant complaints. Patient alert and laying in bed,calm. Denies any fevers, headache, chest pain, SOB, cough, abdominal pain, nausea, vomiting or bleeding.    Labs are within acceptable parameters.  Patient is on ASA 325 mg. Patient states that he has not taken it since 12.10.25 No pertinent allergies. Patient has been NPO since midnight.   Return precautions and treatment recommendations and follow-up discussed with the patient and his wife. Both who are agreeable with the plan.   Past Medical History:  Diagnosis Date   Aortic atherosclerosis    Arthritis    Bladder tumor    Compression fracture of thoracic spine, non-traumatic (HCC) 01/28/2018   Coronary artery calcification    Dysphagia    Elevated liver enzymes    Esophageal cancer (HCC) 2018   Chemo + Rad tx's with surgical resection.    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 (HCC) 06/05/2017    Past Surgical History:  Procedure Laterality Date   CHOLECYSTECTOMY     COLONOSCOPY     COMPLETE ESOPHAGECTOMY N/A 06/18/2017   Procedure: cervical ESOPHAGECTOMY COMPLETE;  Surgeon: Army Dallas NOVAK, MD;  Location: [facility] OR;  Service: Thoracic;  Laterality: N/A;  NEED NIMS ET TUBE   ESOPHAGOGASTRODUODENOSCOPY (EGD) WITH PROPOFOL  N/A 12/07/2016   Procedure: ESOPHAGOGASTRODUODENOSCOPY (EGD) WITH PROPOFOL ;  Surgeon: Therisa Bi, MD;  Location: [facility] ENDOSCOPY;  Service: Gastroenterology;  Laterality: N/A;   ESOPHAGOGASTRODUODENOSCOPY (EGD) WITH PROPOFOL  N/A 12/26/2016   Procedure: ESOPHAGOGASTRODUODENOSCOPY (EGD) WITH PROPOFOL  WITH DILATION;  Surgeon: Therisa Bi, MD;  Location: [facility] ENDOSCOPY;  Service: Gastroenterology;  Laterality: N/A;   EYE SURGERY Bilateral    Cataracts   GASTROJEJUNOSTOMY N/A 01/16/2017   Procedure: LAPROSCOPIC ASSIST FEEDING JEJUNOSTOMY TUBE;  Surgeon: Gladis Cough, MD;  Location: [facility] ORS;  Service: General;  Laterality: N/A;   IR FLUORO GUIDE PORT INSERTION RIGHT  01/10/2017   IR REMOVAL TUN ACCESS W/ PORT W/O FL MOD SED  03/22/2018   IR REPLACE G-TUBE SIMPLE WO FLUORO  03/15/2017   IR REPLC DUODEN/JEJUNO TUBE PERCUT W/FLUORO  03/05/2017   KYPHOPLASTY N/A 08/31/2020   Procedure: L1 KYPHOPLASTY;  Surgeon: Kathlynn Sharper, MD;  Location: [facility] ORS;  Service: Orthopedics;  Laterality: N/A;   KYPHOPLASTY N/A 10/05/2020   Procedure: T12 KYPHOPLASTY;  Surgeon: Kathlynn Sharper, MD;  Location: [facility] ORS;  Service: Orthopedics;  Laterality: N/A;   PICC LINE INSERTION Right    PROSTATE BIOPSY N/A 05/03/2021   Procedure: PROSTATE BIOPSY;  Surgeon: Twylla Glendia BROCKS, MD;  Location:  [facility] ORS;  Service: Urology;  Laterality: N/A;   PYLOROMYOTOMY N/A 06/18/2017   Procedure: JUDITHE;  Surgeon: Army Dallas NOVAK, MD;  Location[facility] OR;  Service: Thoracic;  Laterality: N/A;   TRANSRECTAL ULTRASOUND N/A 05/03/2021   Procedure:  TRANSRECTAL ULTRASOUND;  Surgeon: Twylla Glendia BROCKS, MD;  Location: [facility] ORS;  Service: Urology;  Laterality: N/A;   TRANSURETHRAL RESECTION OF BLADDER TUMOR N/A 02/15/2021   Procedure: TRANSURETHRAL RESECTION OF BLADDER TUMOR (TURBT);  Surgeon: Twylla Glendia BROCKS, MD;  Location: [facility] ORS;  Service: Urology;  Laterality: N/A;   TRANSURETHRAL RESECTION OF BLADDER TUMOR WITH MITOMYCIN -C N/A 01/13/2020   Procedure: TRANSURETHRAL RESECTION OF BLADDER TUMOR WITH gemcitabine ;  Surgeon: Twylla Glendia BROCKS, MD;  Location: [facility] ORS;  Service: Urology;  Laterality: N/A;   TRANSURETHRAL RESECTION OF PROSTATE N/A 05/03/2021   Procedure: TRANSURETHRAL RESECTION OF THE PROSTATE (TURP);  Surgeon: Twylla Glendia BROCKS, MD;  Location: [facility] ORS;  Service: Urology;  Laterality: N/A;   VIDEO BRONCHOSCOPY N/A 06/18/2017   Procedure: VIDEO BRONCHOSCOPY;  Surgeon: Army Dallas NOVAK, MD;  Location: [facility] OR;  Service: Thoracic;  Laterality: N/A;    Allergies: Tramadol   Medications: Prior to Admission medications  Medication Sig Start Date End Date Taking? Authorizing Provider  Acetaminophen  500 MG capsule Take by mouth.    [provider]  aspirin 325 MG tablet Take 325 mg by mouth daily as needed for mild pain (pain score 1-3).    [provider]  Calcium Carb-Cholecalciferol 500-10 MG-MCG TABS Take 1 tablet by mouth daily.    [provider]  ERLEADA  60 MG tablet TAKE 3 TABLETS BY MOUTH EVERY DAY 02/11/24   Rao, Archana C, MD  Multiple Vitamin (MULTIVITAMIN) tablet Take 1 tablet by mouth daily.    [provider]  ondansetron  (ZOFRAN ) 8 MG tablet Take 1 tablet (8 mg total) by mouth every 8 (eight) hours as needed for nausea or vomiting. 01/22/24   Borders, Fonda SAUNDERS, NP  oxyCODONE  (OXY IR/ROXICODONE ) 5 MG immediate release tablet Take 1 tablet (5 mg total) by mouth every 4 (four) hours as needed for severe pain (pain score 7-10). 01/22/24   Borders, Fonda SAUNDERS, NP  prochlorperazine  (COMPAZINE )  10 MG tablet Take 1 tablet (10 mg total) by mouth every 6 (six) hours as needed for nausea or vomiting. 08/08/23   Borders, Fonda SAUNDERS, NP     Family History  Problem Relation Age of Onset   Heart disease Father    Diabetes Mother    Hypertension Mother    Heart attack Brother 77   Prostate cancer Neg Hx    Bladder Cancer Neg Hx    Kidney cancer Neg Hx     Social History   Socioeconomic History   Marital status: Married    Spouse name: Not on file   Number of children: Not on file   Years of education: Not on file   Highest education level: Not on file  Occupational History   Not on file  Tobacco Use   Smoking status: Never   Smokeless tobacco: Never  Vaping Use   Vaping status: Never Used  Substance and Sexual Activity   Alcohol use: No   Drug use: No   Sexual activity: Not Currently  Other Topics Concern   Not on file  Social History Narrative   Independent at baseline. Ambulatory   Social Drivers of Health   Tobacco Use: Low Risk (02/21/2024)   Patient History    Smoking Tobacco Use: Never  Smokeless Tobacco Use: Never    Passive Exposure: Not on file  Financial Resource Strain: Low Risk  (12/06/2023)   Received from [facility]   Overall Financial Resource Strain (CARDIA)    Difficulty of Paying Living Expenses: Not hard at all  Food Insecurity: No Food Insecurity (12/06/2023)   Received from [facility]   Epic    Within the past 12 months, you worried that your food would run out before you got the money to buy more.: Never true    Within the past 12 months, the food you bought just didn't last and you didn't have money to get more.: Never true  Transportation Needs: No Transportation Needs (12/06/2023)   Received from [facility] - Transportation    In the past 12 months, has lack of transportation kept you from medical appointments or from getting medications?: No    Lack of Transportation  (Non-Medical): No  Physical Activity: Not on file  Stress: Not on file  Social Connections: Not on file  Depression (PHQ2-9): Low Risk (02/01/2024)   Depression (PHQ2-9)    PHQ-2 Score: 0  Alcohol Screen: Not on file  Housing: Low Risk  (12/06/2023)   Received from [facility]   Epic    In the last 12 months, was there a time when you were not able to pay the mortgage or rent on time?: No    In the past 12 months, how many times have you moved where you were living?: 0    At any time in the past 12 months, were you homeless or living in a shelter (including now)?: No  Utilities: Not At Risk (12/06/2023)   Received from [facility]   Epic    In the past 12 months has the electric, gas, oil, or water  company threatened to shut off services in your home?: No  Health Literacy: Adequate Health Literacy (04/13/2023)   B1300 Health Literacy    Frequency of need for help with medical instructions: Never    Review of Systems: A 12 point ROS discussed and pertinent positives are indicated in the HPI above.  All other systems are negative.  Review of Systems  Constitutional:  Negative for fever.  HENT:  Negative for congestion.   Respiratory:  Negative for cough and shortness of breath.   Cardiovascular:  Negative for chest pain.  Gastrointestinal:  Negative for abdominal pain.  Neurological:  Negative for headaches.  Psychiatric/Behavioral:  Negative for behavioral problems and confusion.     Vital Signs: BP (!) 140/74   Pulse 71   Temp 97.7 F (36.5 C) (Oral)   Resp 16   Ht 5' 8 (1.727 m)   Wt 151 lb 14.4 oz (68.9 kg)   SpO2 97%   BMI 23.10 kg/m   Advance Care Plan: The advanced care plan/surrogate decision maker was discussed at the time of visit and the patient did not wish to discuss or was not able to name a surrogate decision maker or provide an advance care plan.    Physical Exam Vitals and nursing note reviewed.  Constitutional:       Appearance: He is well-developed.  HENT:     Head: Normocephalic.     Mouth/Throat:     Mouth: Mucous membranes are dry.  Cardiovascular:     Rate and Rhythm: Normal rate and regular rhythm.  Pulmonary:     Effort: Pulmonary effort is normal.  Musculoskeletal:        General: Normal range of motion.     Cervical back: Normal range of motion.  Skin:    General: Skin is warm and dry.  Neurological:     General: No focal deficit present.     Mental Status: He is alert and oriented to person, place, and time. Mental status is at baseline.  Psychiatric:        Mood and Affect: Mood normal.        Behavior: Behavior normal.        Thought Content: Thought content normal.        Judgment: Judgment normal.     Imaging: NM PET Image Initial (PI) Skull Base To Thigh Result Date: 02/12/2024 CLINICAL DATA:  Subsequent treatment strategy for esophageal cancer. EXAM: NUCLEAR MEDICINE PET SKULL BASE TO THIGH TECHNIQUE: 8.21 mCi F-18 FDG was injected intravenously. Full-ring PET imaging was performed from the skull base to thigh after the radiotracer. CT data was obtained and used for attenuation correction and anatomic localization. Fasting blood glucose: 100 mg/dl COMPARISON:  PET-CT 97/98/7980. CT of the chest, abdomen and pelvis 01/18/2024. Whole-body bone scan 12/26/2023. FINDINGS: Mediastinal blood pool activity: SUV max 2.4 NECK: No hypermetabolic cervical lymph nodes are identified.New focal hypermetabolic supraglottic activity in the region of the right piriformis sinus (SUV max 6.3). No other suspicious activity identified within the pharyngeal mucosal space. Incidental CT findings: Bilateral carotid atherosclerosis. CHEST: There are no hypermetabolic mediastinal, hilar or axillary lymph nodes. No hypermetabolic activity associated with the proximal anastomosis status post esophagectomy and gastric pull-through. No hypermetabolic pulmonary activity. The new right upper lobe nodule measuring 4  mm on image 48/6 is unchanged from the recent CT, too small to evaluate by PET-CT. Incidental CT findings: Calcified right hilar and mediastinal lymph nodes. Atherosclerosis of the aorta, great vessels and coronary arteries. Calcifications of the aortic valve. Stable hiatal hernia containing portions of the splenic flexure of the colon. ABDOMEN/PELVIS: New widespread hepatic metastatic disease with multiple ill-defined hypoattenuating liver lesions with associated hypermetabolic activity. Representative lesions include a 2.2 cm lesion anteriorly in the dome of the right lobe (segment 8) with an SUV max of 7.6 (image 73/6), a 2.2 cm lesion in segment 7 with an SUV max of 6.5 (image 74/6), and a 1.7 cm lesion in segment 2 with an SUV max of 6.4 (image 83/6). There are multiple additional smaller lesions. The recently identified enlarging soft tissue mass posterior to the gastric pull-through at the level of the diaphragmatic crus demonstrates marked hypermetabolic activity. This mass measures 4.0 x 2.5 cm on image 84/6 and has an SUV max of 9.6. There are other smaller hypermetabolic lymph nodes in the gastrohepatic ligament and porta hepatis. The most inferior nodule is a left periaortic node at the level of the renal vessels, measuring 1.2 cm on image 94/6 (SUV max 7.2). No hypermetabolic activity within the spleen, adrenal glands or pancreas. No peritoneal disease identified. Incidental CT findings: Status post cholecystectomy. Nonobstructing bilateral renal calculi. Aortic and branch vessel atherosclerosis without evidence of aneurysm. Fiducial markers noted in the prostate gland. SKELETON: There is no hypermetabolic activity to suggest osseous metastatic disease. Incidental CT findings: Multiple thoracolumbar compression deformities status post spinal augmentation at T12 and L1. No significant metabolic activity associated with the new fractures at T6, L2 and L3 on recent CT. Previously demonstrated scattered  sclerotic osseous lesions do not demonstrate increased metabolic activity. IMPRESSION: 1. The recently identified enlarging soft tissue mass  posterior to the gastric pull-through at the level of the diaphragmatic crus demonstrates marked hypermetabolic activity consistent with metastatic disease. 2. There are other smaller hypermetabolic lymph nodes in the gastrohepatic ligament and porta hepatis consistent with metastatic disease. 3. New widespread hepatic metastatic disease. 4. The new right upper lobe nodule measuring 4 mm is too small to evaluate by PET-CT and could reflect a metastasis. No hypermetabolic activity identified within the thorax. 5. New focal hypermetabolic activity in the supraglottic region in the region of the right piriformis sinus. Recommend correlation with direct visualization to exclude a mucosal lesion. 6. No evidence of metabolically active osseous metastatic disease. Multiple thoracolumbar compression deformities status post spinal augmentation at T12 and L1. No significant metabolic activity associated with the new fractures at T6, L2 and L3 on recent CT. 7.  Aortic Atherosclerosis (ICD10-I70.0). Electronically Signed   By: Elsie Perone M.D.   On: 02/12/2024 14:27    Labs:  CBC: Recent Labs    08/01/23 1046 10/31/23 0948 02/01/24 0910 02/21/24 0942  WBC 3.9* 4.7 4.3 5.5  HGB 13.4 13.4 13.0 13.8  HCT 40.4 41.4 39.5 43.4  PLT 222 234 225 258    COAGS: Recent Labs    02/21/24 0942  INR 0.9    BMP: Recent Labs    06/15/23 0905 08/01/23 1051 10/31/23 0947 02/01/24 0910  NA 141 137 140 140  K 4.1 4.4 4.3 4.4  CL 108 107 110 108  CO2 24 23 23  21*  GLUCOSE 85 128* 99 148*  BUN 19 22 21 21   CALCIUM 9.0 8.6* 9.3 8.8*  CREATININE 1.20 1.28* 1.19 1.47*  GFRNONAA >60 56* >60 47*    LIVER FUNCTION TESTS: Recent Labs    06/15/23 0905 08/01/23 1051 10/31/23 0947 02/01/24 0910  BILITOT 0.7 0.5 0.6 0.4  AST 13* 12* 12* 14*  ALT 15 12 12 12   ALKPHOS  93 92 72 79  PROT 6.9 6.9 6.8 6.7  ALBUMIN  4.0 3.8 4.1 4.0    TUMOR MARKERS: No results for input(s): AFPTM, CEA, CA199, CHROMGRNA in the last 8760 hours.  Assessment and Plan:  83 y.o. male outpatient. 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 Found to have recurrence on CT scan. PET scan 12.2.25 shows widespread liver mets. PET scan reads New widespread hepatic metastatic disease with multiple ill-defined hypoattenuating liver lesions with associated hypermetabolic activity. Representative lesions include a 2.2 cm lesion anteriorly in the dome of the right lobe (segment 8) with an SUV max of 7.6 (image 73/6), a 2.2 cm lesion in segment 7 with an SUV max of 6.5 (image 74/6), and a 1.7 cm lesion in segment 2 with an SUV max of 6.4 (image 83/6). Team is requesting a liver lesion biopsy. Case approved by IR Attending Dr. Wilkie Lent.   PLAN: IR Image Guided Liver Lesion Biopsy   Risks and benefits of liver lesion biopsy was discussed with the patient and/or patient's family including, but not limited to bleeding, infection, damage to adjacent structures or low yield requiring additional tests.  All of the questions were answered and there is agreement to proceed.  Consent signed and in chart.     Thank you for this interesting consult.  I greatly enjoyed meeting Casey Acevedo and look forward to participating in their care.  A copy of this report was sent to the requesting provider on this date.  Electronically Signed: Delon JAYSON Beagle,  NP 02/26/2024, 8:47 AM   I spent a total of  30 Minutes   in face to face in clinical consultation, greater than 50% of which was counseling/coordinating care for liver lesion biopsy

## 2024-02-26 NOTE — Discharge Instructions (Signed)
 Liver Biopsy, Care After These instructions give you information about how to care for yourself after your procedure. Your health care provider may also give you more specific instructions. If you have problems or questions, contact your health care provider. What can I expect after the procedure? After your procedure, it is common to have: Pain and soreness in the area where the biopsy was done. Bruising around the area where the biopsy was done. Sleepiness and fatigue for 1-2 days. Follow these instructions at home: Medicines Take over-the-counter and prescription medicines only as told by your health care provider. If you were prescribed an antibiotic medicine, take it as told by your health care provider. Do not stop taking the antibiotic even if you start to feel better. Do not take medicines such as aspirin and ibuprofen unless your health care provider tells you to take them. These medicines thin your blood and can increase the risk of bleeding. If you are taking prescription pain medicine, take actions to prevent or treat constipation. Your health care provider may recommend that you: Drink enough fluid to keep your urine pale yellow. Eat foods that are high in fiber, such as fresh fruits and vegetables, whole grains, and beans. Limit foods that are high in fat and processed sugars, such as fried or sweet foods. Take an over-the-counter or prescription medicine for constipation. Incision care Wash your hands with soap and water before you change your bandage (dressing). If soap and water are not available, use hand sanitizer. Change your bandage tomorrow after you shower and then remove the next day. Check your incision area every day for signs of infection. Check for: Redness, swelling, or pain. Fluid or blood. Warmth. Pus or a bad smell. You may shower tomorrow No lifting more than 5 lbs for 3 days Return to your normal activities as told by your health care provider.  Do not  drive or use heavy machinery for 24hrs or while taking prescription pain medicine. Do not play contact sports for 2 weeks after the procedure. General instructions  Do not drink alcohol in the first week after the procedure. Have someone stay with you for at least 24 hours after the procedure. It is your responsibility to obtain your test results. Ask your health care provider, or the department that is doing the test: When will my results be ready? How will I get my results? What are my treatment options? What other tests do I need? What are my next steps? Keep all follow-up visits as told by your health care provider. This is important. Contact a health care provider if: You have increased bleeding from an incision, resulting in more than a small spot of blood. You have redness, swelling, or increasing pain in any incisions. You notice a discharge or a bad smell coming from any of your incisions. You have a fever or chills. Get help right away if: You develop swelling, bloating, or pain in your abdomen. You become dizzy or faint. You develop a rash. You have nausea or you vomit. You faint, or you have shortness of breath or difficulty breathing. You develop chest pain. You have problems with your speech or vision. You have trouble with your balance or moving your arms or legs. Summary After the liver biopsy, it is common to have pain, soreness, and bruising in the area, as well as sleepiness and fatigue. Take over-the-counter and prescription medicines only as told by your health care provider. Follow instructions from your health care provider about how  to care for your incision. Check the incision area daily for signs of infection. This information is not intended to replace advice given to you by your health care provider. Make sure you discuss any questions you have with your health care provider. Document Released: 09/16/2004 Document Revised: 04/22/2018 Document Reviewed:  03/09/2017 Elsevier Patient Education  2020 [employer].

## 2024-02-28 ENCOUNTER — Telehealth: Payer: Self-pay

## 2024-02-28 ENCOUNTER — Other Ambulatory Visit: Payer: Self-pay

## 2024-02-28 DIAGNOSIS — C159 Malignant neoplasm of esophagus, unspecified: Secondary | ICD-10-CM

## 2024-02-28 LAB — SURGICAL PATHOLOGY

## 2024-02-28 NOTE — Telephone Encounter (Signed)
 [facility] requested on specimen [phone], liver, collected 02/26/2024.

## 2024-03-03 ENCOUNTER — Other Ambulatory Visit: Payer: Self-pay

## 2024-03-03 DIAGNOSIS — C159 Malignant neoplasm of esophagus, unspecified: Secondary | ICD-10-CM

## 2024-03-04 ENCOUNTER — Inpatient Hospital Stay: Attending: Oncology | Admitting: Oncology

## 2024-03-04 ENCOUNTER — Inpatient Hospital Stay

## 2024-03-04 ENCOUNTER — Encounter: Payer: Self-pay | Admitting: Oncology

## 2024-03-04 VITALS — BP 138/73 | HR 84 | Temp 96.9°F | Resp 18 | Ht 68.0 in | Wt 152.6 lb

## 2024-03-04 DIAGNOSIS — C159 Malignant neoplasm of esophagus, unspecified: Secondary | ICD-10-CM | POA: Diagnosis not present

## 2024-03-04 DIAGNOSIS — C7951 Secondary malignant neoplasm of bone: Secondary | ICD-10-CM | POA: Insufficient documentation

## 2024-03-04 DIAGNOSIS — Z7189 Other specified counseling: Secondary | ICD-10-CM | POA: Diagnosis not present

## 2024-03-04 DIAGNOSIS — C61 Malignant neoplasm of prostate: Secondary | ICD-10-CM | POA: Insufficient documentation

## 2024-03-04 DIAGNOSIS — Z8501 Personal history of malignant neoplasm of esophagus: Secondary | ICD-10-CM | POA: Insufficient documentation

## 2024-03-04 MED ORDER — DEXAMETHASONE 4 MG PO TABS: 8.0000 mg | ORAL_TABLET | Freq: Every day | ORAL | 1 refills | Status: AC

## 2024-03-04 MED ORDER — LIDOCAINE-PRILOCAINE 2.5-2.5 % EX CREA: TOPICAL_CREAM | CUTANEOUS | 3 refills | Status: AC

## 2024-03-04 MED ORDER — PROCHLORPERAZINE MALEATE 10 MG PO TABS: 10.0000 mg | ORAL_TABLET | Freq: Four times a day (QID) | ORAL | 1 refills | Status: DC | PRN

## 2024-03-04 MED ORDER — ONDANSETRON HCL 8 MG PO TABS: 8.0000 mg | ORAL_TABLET | Freq: Three times a day (TID) | ORAL | 1 refills | Status: AC | PRN

## 2024-03-04 NOTE — Progress Notes (Signed)
 Patient doing okay; no new or acute concerns at this time.

## 2024-03-04 NOTE — Progress Notes (Signed)
 DISCONTINUE ON PATHWAY REGIMEN - Gastroesophageal  No Medical Intervention - Off Treatment.  PRIOR TREATMENT: Off Treatment  START ON PATHWAY REGIMEN - Gastroesophageal     A cycle is every 14 days:     Oxaliplatin      Leucovorin      Fluorouracil      Fluorouracil   **Always confirm dose/schedule in your pharmacy ordering system**  Patient Characteristics: Distant Metastases (cM1/pM1) / Locally Recurrent Disease, Squamous Cell, Esophageal & GE Junction, First Line, PD?L1 Expression  CPS < 1/Negative/Unknown Disease Classification: Esophageal Histology: Squamous Cell Therapeutic Status: Distant Metastases (No Additional Staging) Line of Therapy: First Line PD-L1 Expression Status: Awaiting Test Results Intent of Therapy: Non-Curative / Palliative Intent, Discussed with Patient

## 2024-03-04 NOTE — Progress Notes (Signed)
 "    Hematology/Oncology Consult note [facility]  [phone] [phone]  Patient Care Team: Auston Reyes BIRCH, MD as PCP - General (Internal Medicine) End, Lonni, MD as PCP - Cardiology (Cardiology) Army Dallas NOVAK, MD (Inactive) as Consulting Physician (Cardiothoracic Surgery) Melanee Annah BROCKS, MD as Consulting Physician (Oncology) Lenn Aran, MD as Referring Physician (Radiation Oncology)   Name of the patient: Casey Acevedo  [id number]  Oct 05, 1940   Date of visit: 03/04/2024  Diagnosis- 1.  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  Metastatic castrate sensitive prostate cancer  Chief complaint/ Reason for visit-discuss liver biopsy results and further management  Heme/Onc history:  Patient is a 83 year old male 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 Pathology showed no residual carcinoma at primary site. 1/5 LN positive for malignancy. 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  Patient currently remains in remission    Patient was diagnosed with high-grade prostate cancer in 2021.  Initial PSA was 14.  It was a stage IIIc adenocarcinoma with a Gleason's 9.  He received IMRT radiation therapy following which his PSA declined to 0.05.  He received 1 dose of Lupron  in April 2023 but none after that.  His PSA was 0.05 a year ago and then went up to 0.226 months ago and is presently at 10.9. CT chest abdomen and pelvis with contrast showed multifocal sclerotic osseous foci along with enlarged retroperitoneal and hiatal lymph nodes consistent with malignancy.  Bone scan also showed abnormal radiotracer uptake involving calvarium spine ribs right clavicle and pelvic  girdle.   Patient is currently on ADT and was started on apalutamide  in December 2024. CT chest abdomen and pelvis with contrast in November 2025 which was done for follow-up of prostate cancer showed soft tissue mass about the left diaphragmatic crus consistent with recurrent disease.  Enlarged celiac axis and left retroperitoneal lymph nodes.  PET CT scan also showed areas of multiple liver metastases.  Liver biopsy was consistent with metastatic squamous cell carcinoma.  CEA elevated at 10    Interval history-patient is presently feeling well and denies any complaints today.  He is on apalutamide  for his prostate cancer  ECOG PS- 1 Pain scale- 0  Review of systems- Review of Systems  Constitutional:  Negative for chills, fever, malaise/fatigue and weight loss.  HENT:  Negative for congestion, ear discharge and nosebleeds.   Eyes:  Negative for blurred vision.  Respiratory:  Negative for cough, hemoptysis, sputum production, shortness of breath and wheezing.   Cardiovascular:  Negative for chest pain, palpitations, orthopnea and claudication.  Gastrointestinal:  Negative for abdominal pain, blood in stool, constipation, diarrhea, heartburn, melena, nausea and vomiting.  Genitourinary:  Negative for dysuria, flank pain, frequency, hematuria and urgency.  Musculoskeletal:  Negative for back pain, joint pain and myalgias.  Skin:  Negative for rash.  Neurological:  Negative for dizziness, tingling, focal weakness, seizures, weakness and headaches.  Endo/Heme/Allergies:  Does not bruise/bleed easily.  Psychiatric/Behavioral:  Negative for depression and suicidal ideas. The patient does not have insomnia.       Allergies[1]   Past Medical History:  Diagnosis Date   Aortic atherosclerosis    Arthritis    Bladder tumor    Compression fracture of thoracic spine, non-traumatic (HCC) 01/28/2018   Coronary artery calcification  Dysphagia    Elevated liver enzymes    Esophageal cancer  (HCC) 2018   Chemo + Rad tx's with surgical resection.    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 (HCC) 06/05/2017     Past Surgical History:  Procedure Laterality Date   CHOLECYSTECTOMY     COLONOSCOPY     COMPLETE ESOPHAGECTOMY N/A 06/18/2017   Procedure: cervical ESOPHAGECTOMY COMPLETE;  Surgeon: Army Dallas NOVAK, MD;  Location: [facility] OR;  Service: Thoracic;  Laterality: N/A;  NEED NIMS ET TUBE   ESOPHAGOGASTRODUODENOSCOPY (EGD) WITH PROPOFOL  N/A 12/07/2016   Procedure: ESOPHAGOGASTRODUODENOSCOPY (EGD) WITH PROPOFOL ;  Surgeon: Therisa Bi, MD;  Location: [facility] ENDOSCOPY;  Service: Gastroenterology;  Laterality: N/A;   ESOPHAGOGASTRODUODENOSCOPY (EGD) WITH PROPOFOL  N/A 12/26/2016   Procedure: ESOPHAGOGASTRODUODENOSCOPY (EGD) WITH PROPOFOL  WITH DILATION;  Surgeon: Therisa Bi, MD;  Location: [facility] ENDOSCOPY;  Service: Gastroenterology;  Laterality: N/A;   EYE SURGERY Bilateral    Cataracts   GASTROJEJUNOSTOMY N/A 01/16/2017   Procedure: LAPROSCOPIC ASSIST FEEDING JEJUNOSTOMY TUBE;  Surgeon: Gladis Cough, MD;  Location: [facility] ORS;  Service: General;  Laterality: N/A;   IR FLUORO GUIDE PORT INSERTION RIGHT  01/10/2017   IR REMOVAL TUN ACCESS W/ PORT W/O FL MOD SED  03/22/2018   IR REPLACE G-TUBE SIMPLE WO FLUORO  03/15/2017   IR REPLC DUODEN/JEJUNO TUBE PERCUT W/FLUORO  03/05/2017   KYPHOPLASTY N/A 08/31/2020   Procedure: L1 KYPHOPLASTY;  Surgeon: Kathlynn Sharper, MD;  Location: [facility] ORS;  Service: Orthopedics;  Laterality: N/A;   KYPHOPLASTY N/A 10/05/2020   Procedure: T12 KYPHOPLASTY;  Surgeon: Kathlynn Sharper, MD;  Location: [facility] ORS;  Service: Orthopedics;  Laterality: N/A;   PICC LINE INSERTION Right    PROSTATE BIOPSY N/A 05/03/2021   Procedure: PROSTATE BIOPSY;  Surgeon: Twylla Glendia BROCKS, MD;  Location: [facility] ORS;  Service: Urology;  Laterality: N/A;   PYLOROMYOTOMY N/A 06/18/2017   Procedure: JUDITHE;  Surgeon: Army Dallas NOVAK, MD;  Location: [facility] OR;  Service:  Thoracic;  Laterality: N/A;   TRANSRECTAL ULTRASOUND N/A 05/03/2021   Procedure: TRANSRECTAL ULTRASOUND;  Surgeon: Twylla Glendia BROCKS, MD;  Location: [facility] ORS;  Service: Urology;  Laterality: N/A;   TRANSURETHRAL RESECTION OF BLADDER TUMOR N/A 02/15/2021   Procedure: TRANSURETHRAL RESECTION OF BLADDER TUMOR (TURBT);  Surgeon: Twylla Glendia BROCKS, MD;  Location: [facility] ORS;  Service: Urology;  Laterality: N/A;   TRANSURETHRAL RESECTION OF BLADDER TUMOR WITH MITOMYCIN -C N/A 01/13/2020   Procedure: TRANSURETHRAL RESECTION OF BLADDER TUMOR WITH gemcitabine ;  Surgeon: Twylla Glendia BROCKS, MD;  Location: [facility] ORS;  Service: Urology;  Laterality: N/A;   TRANSURETHRAL RESECTION OF PROSTATE N/A 05/03/2021   Procedure: TRANSURETHRAL RESECTION OF THE PROSTATE (TURP);  Surgeon: Twylla Glendia BROCKS, MD;  Location: [facility] ORS;  Service: Urology;  Laterality: N/A;   VIDEO BRONCHOSCOPY N/A 06/18/2017   Procedure: VIDEO BRONCHOSCOPY;  Surgeon: Army Dallas NOVAK, MD;  Location: [facility] OR;  Service: Thoracic;  Laterality: N/A;    Social History   Socioeconomic History   Marital status: Married    Spouse name: Not on file   Number of children: Not on file   Years of education: Not on file   Highest education level: Not on file  Occupational History   Not on file  Tobacco Use   Smoking status: Never   Smokeless tobacco: Never  Vaping Use   Vaping status: Never Used  Substance and Sexual Activity   Alcohol use: No   Drug use: No   Sexual activity: Not Currently  Other  Topics Concern   Not on file  Social History Narrative   Independent at baseline. Ambulatory   Social Drivers of Health   Tobacco Use: Low Risk (03/04/2024)   Patient History    Smoking Tobacco Use: Never    Smokeless Tobacco Use: Never    Passive Exposure: Not on file  Financial Resource Strain: Low Risk  (12/06/2023)   Received from [facility]   Overall Financial Resource Strain (CARDIA)    Difficulty of Paying Living Expenses:  Not hard at all  Food Insecurity: No Food Insecurity (12/06/2023)   Received from [facility]   Epic    Within the past 12 months, you worried that your food would run out before you got the money to buy more.: Never true    Within the past 12 months, the food you bought just didn't last and you didn't have money to get more.: Never true  Transportation Needs: No Transportation Needs (12/06/2023)   Received from [facility] - Transportation    In the past 12 months, has lack of transportation kept you from medical appointments or from getting medications?: No    Lack of Transportation (Non-Medical): No  Physical Activity: Not on file  Stress: Not on file  Social Connections: Not on file  Intimate Partner Violence: Not on file  Depression (PHQ2-9): Low Risk (03/04/2024)   Depression (PHQ2-9)    PHQ-2 Score: 0  Alcohol Screen: Not on file  Housing: Low Risk  (12/06/2023)   Received from [facility]   Epic    In the last 12 months, was there a time when you were not able to pay the mortgage or rent on time?: No    In the past 12 months, how many times have you moved where you were living?: 0    At any time in the past 12 months, were you homeless or living in a shelter (including now)?: No  Utilities: Not At Risk (12/06/2023)   Received from [facility]   Epic    In the past 12 months has the electric, gas, oil, or water  company threatened to shut off services in your home?: No  Health Literacy: Adequate Health Literacy (04/13/2023)   B1300 Health Literacy    Frequency of need for help with medical instructions: Never    Family History  Problem Relation Age of Onset   Heart disease Father    Diabetes Mother    Hypertension Mother    Heart attack Brother 46   Prostate cancer Neg Hx    Bladder Cancer Neg Hx    Kidney cancer Neg Hx     Current Medications[2]  Physical exam:  Vitals:   03/04/24 1308   BP: 138/73  Pulse: 84  Resp: 18  Temp: (!) 96.9 F (36.1 C)  TempSrc: Tympanic  SpO2: 100%  Weight: 152 lb 9.6 oz (69.2 kg)  Height: 5' 8 (1.727 m)   Physical Exam Cardiovascular:     Rate and Rhythm: Normal rate and regular rhythm.     Heart sounds: Normal heart sounds.  Pulmonary:     Effort: Pulmonary effort is normal.     Breath sounds: Normal breath sounds.  Skin:    General: Skin is warm and dry.  Neurological:     Mental Status: He is alert and oriented to person, place, and time.      I have personally reviewed labs listed below:  Latest Ref Rng & Units 02/01/2024    9:10 AM  CMP  Glucose 70 - 99 mg/dL 851   BUN 8 - 23 mg/dL 21   Creatinine 9.38 - 1.24 mg/dL 8.52   Sodium 864 - 854 mmol/L 140   Potassium 3.5 - 5.1 mmol/L 4.4   Chloride 98 - 111 mmol/L 108   CO2 22 - 32 mmol/L 21   Calcium 8.9 - 10.3 mg/dL 8.8   Total Protein 6.5 - 8.1 g/dL 6.7   Total Bilirubin 0.0 - 1.2 mg/dL 0.4   Alkaline Phos 38 - 126 U/L 79   AST 15 - 41 U/L 14   ALT 0 - 44 U/L 12       Latest Ref Rng & Units 02/26/2024    8:33 AM  CBC  WBC 4.0 - 10.5 K/uL 5.0   Hemoglobin 13.0 - 17.0 g/dL 86.0   Hematocrit 60.9 - 52.0 % 42.9   Platelets 150 - 400 K/uL 250    I have personally reviewed Radiology images listed below:   US  BIOPSY (LIVER) Result Date: 02/26/2024 INDICATION: History of squamous esophageal carcinoma and prostate carcinoma. Multiple liver lesions concerning for metastatic disease. The patient presents for liver lesion biopsy. EXAM: ULTRASOUND GUIDED CORE BIOPSY OF LIVER MEDICATIONS: None. ANESTHESIA/SEDATION: Moderate (conscious) sedation was employed during this procedure. A total of Versed  1.0 mg and Fentanyl  50 mcg was administered intravenously. Moderate Sedation Time: 16 minutes. The patient's level of consciousness and vital signs were monitored continuously by radiology nursing throughout the procedure under my direct supervision. PROCEDURE: The procedure,  risks, benefits, and alternatives were explained to the patient. Questions regarding the procedure were encouraged and answered. The patient understands and consents to the procedure. A time out was performed prior to initiating the procedure. The abdominal wall was prepped with chlorhexidine  in a sterile fashion, and a sterile drape was applied covering the operative field. A sterile gown and sterile gloves were used for the procedure. Local anesthesia was provided with 1% Lidocaine . Ultrasound was used to localize liver lesions. A lesion was chosen for sampling. Under ultrasound guidance, a 17 gauge trocar needle was advanced to the edge of the liver lesion. Three separate coaxial 18 gauge core biopsy samples were obtained and submitted in formalin. A slurry of Gel-Foam EmboCubes mixed in sterile saline was then injected through the outer needle as the needle was retracted and removed. Additional ultrasound was performed. COMPLICATIONS: None immediate. FINDINGS: Relatively hyperechoic lesions are identified in the liver. The most visible and accessible lesion is a lesion in the right lobe measuring approximately 2.1 x 2.0 x 1.6 cm. This lesion was sampled with solid tissue obtained. IMPRESSION: Ultrasound-guided core biopsy performed of a lesion in the right lobe of the liver measuring up to 2.1 cm in maximum diameter by ultrasound. Electronically Signed   By: Marcey Moan M.D.   On: 02/26/2024 10:56   NM PET Image Initial (PI) Skull Base To Thigh Result Date: 02/12/2024 CLINICAL DATA:  Subsequent treatment strategy for esophageal cancer. EXAM: NUCLEAR MEDICINE PET SKULL BASE TO THIGH TECHNIQUE: 8.21 mCi F-18 FDG was injected intravenously. Full-ring PET imaging was performed from the skull base to thigh after the radiotracer. CT data was obtained and used for attenuation correction and anatomic localization. Fasting blood glucose: 100 mg/dl COMPARISON:  PET-CT 97/98/7980. CT of the chest, abdomen and pelvis  01/18/2024. Whole-body bone scan 12/26/2023. FINDINGS: Mediastinal blood pool activity: SUV max 2.4 NECK: No hypermetabolic cervical lymph nodes are identified.New  focal hypermetabolic supraglottic activity in the region of the right piriformis sinus (SUV max 6.3). No other suspicious activity identified within the pharyngeal mucosal space. Incidental CT findings: Bilateral carotid atherosclerosis. CHEST: There are no hypermetabolic mediastinal, hilar or axillary lymph nodes. No hypermetabolic activity associated with the proximal anastomosis status post esophagectomy and gastric pull-through. No hypermetabolic pulmonary activity. The new right upper lobe nodule measuring 4 mm on image 48/6 is unchanged from the recent CT, too small to evaluate by PET-CT. Incidental CT findings: Calcified right hilar and mediastinal lymph nodes. Atherosclerosis of the aorta, great vessels and coronary arteries. Calcifications of the aortic valve. Stable hiatal hernia containing portions of the splenic flexure of the colon. ABDOMEN/PELVIS: New widespread hepatic metastatic disease with multiple ill-defined hypoattenuating liver lesions with associated hypermetabolic activity. Representative lesions include a 2.2 cm lesion anteriorly in the dome of the right lobe (segment 8) with an SUV max of 7.6 (image 73/6), a 2.2 cm lesion in segment 7 with an SUV max of 6.5 (image 74/6), and a 1.7 cm lesion in segment 2 with an SUV max of 6.4 (image 83/6). There are multiple additional smaller lesions. The recently identified enlarging soft tissue mass posterior to the gastric pull-through at the level of the diaphragmatic crus demonstrates marked hypermetabolic activity. This mass measures 4.0 x 2.5 cm on image 84/6 and has an SUV max of 9.6. There are other smaller hypermetabolic lymph nodes in the gastrohepatic ligament and porta hepatis. The most inferior nodule is a left periaortic node at the level of the renal vessels, measuring 1.2 cm on  image 94/6 (SUV max 7.2). No hypermetabolic activity within the spleen, adrenal glands or pancreas. No peritoneal disease identified. Incidental CT findings: Status post cholecystectomy. Nonobstructing bilateral renal calculi. Aortic and branch vessel atherosclerosis without evidence of aneurysm. Fiducial markers noted in the prostate gland. SKELETON: There is no hypermetabolic activity to suggest osseous metastatic disease. Incidental CT findings: Multiple thoracolumbar compression deformities status post spinal augmentation at T12 and L1. No significant metabolic activity associated with the new fractures at T6, L2 and L3 on recent CT. Previously demonstrated scattered sclerotic osseous lesions do not demonstrate increased metabolic activity. IMPRESSION: 1. The recently identified enlarging soft tissue mass posterior to the gastric pull-through at the level of the diaphragmatic crus demonstrates marked hypermetabolic activity consistent with metastatic disease. 2. There are other smaller hypermetabolic lymph nodes in the gastrohepatic ligament and porta hepatis consistent with metastatic disease. 3. New widespread hepatic metastatic disease. 4. The new right upper lobe nodule measuring 4 mm is too small to evaluate by PET-CT and could reflect a metastasis. No hypermetabolic activity identified within the thorax. 5. New focal hypermetabolic activity in the supraglottic region in the region of the right piriformis sinus. Recommend correlation with direct visualization to exclude a mucosal lesion. 6. No evidence of metabolically active osseous metastatic disease. Multiple thoracolumbar compression deformities status post spinal augmentation at T12 and L1. No significant metabolic activity associated with the new fractures at T6, L2 and L3 on recent CT. 7.  Aortic Atherosclerosis (ICD10-I70.0). Electronically Signed   By: Elsie Perone M.D.   On: 02/12/2024 14:27     Assessment and plan- Patient is a 83 y.o.  male here for discussion of following issues:  Metastatic prostate cancer: Currently under good control with ADT plus apalutamide .  PSA remains undetectable.  He would be due for his next dose of ADT in February 2026  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  He  had neoadjuvant concurrent chemoradiation followed by surgery for esophageal squamous cell carcinoma back in October 2018 and he was in remission since then.  CEA is presently elevated and PET scan shows multiple areas of liver and lymph node metastases.  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  We are awaiting [facility] testing results to determine CPS score and other actionable mutations.  I would like to offer palliative FOLFOX chemotherapy for him which we will start in 2 weeks.  Port placement prior.  Discussed risks and benefits of chemotherapy including all but not limited to nausea vomiting low blood counts risk of infections and hospitalization and risk of peripheral neuropathy associated with oxaliplatin.  Treatment will be given as a palliative intent.  Patient understands and agrees to proceed as planned.  He understands that his overall prognosis from stage IV esophageal cancer is poor with an overall life expectancy that varies around 18 months.  Patient wishes to proceed with palliative chemotherapy at this time  I will see him on 03/19/2024 with CBC with differential CMP CEA and he will receive cycle 1 of FOLFOX chemotherapy at that time   Visit Diagnosis 1. Stage IV malignant neoplasm of esophagus (HCC)   2. Goals of care, counseling/discussion      Dr. Annah Skene, MD, MPH [facility] at [facility] [phone] 03/04/2024 1:59 PM                   [1]  Allergies Allergen Reactions   Tramadol  Other (See Comments)    Hallucination Pt states that it made crazy.  [2]   Current Outpatient Medications:    Acetaminophen  500 MG capsule, Take by mouth., Disp: , Rfl:    aspirin 325 MG tablet, Take 325 mg by mouth daily as needed for mild pain (pain score 1-3)., Disp: , Rfl:    Calcium Carb-Cholecalciferol 500-10 MG-MCG TABS, Take 1 tablet by mouth daily., Disp: , Rfl:    ERLEADA  60 MG tablet, TAKE 3 TABLETS BY MOUTH EVERY DAY, Disp: 120 tablet, Rfl: 2   Multiple Vitamin (MULTIVITAMIN) tablet, Take 1 tablet by mouth daily., Disp: , Rfl:    ondansetron  (ZOFRAN ) 8 MG tablet, Take 1 tablet (8 mg total) by mouth every 8 (eight) hours as needed for nausea or vomiting., Disp: 60 tablet, Rfl: 2   oxyCODONE  (OXY IR/ROXICODONE ) 5 MG immediate release tablet, Take 1 tablet (5 mg total) by mouth every 4 (four) hours as needed for severe pain (pain score 7-10)., Disp: 120 tablet, Rfl: 0   prochlorperazine  (COMPAZINE ) 10 MG tablet, Take 1 tablet (10 mg total) by mouth every 6 (six) hours as needed for nausea or vomiting., Disp: 30 tablet, Rfl: 2   dexamethasone  (DECADRON ) 4 MG tablet, Take 2 tablets (8 mg total) by mouth daily. Start the day after chemotherapy for 2 days. Take with food., Disp: 30 tablet, Rfl: 1   lidocaine -prilocaine  (EMLA ) cream, Apply to affected area once, Disp: 30 g, Rfl: 3   ondansetron  (ZOFRAN ) 8 MG tablet, Take 1 tablet (8 mg total) by mouth every 8 (eight) hours as needed for nausea or vomiting. Start on the third day after chemotherapy., Disp: 30 tablet, Rfl: 1   prochlorperazine  (COMPAZINE ) 10 MG tablet, Take 1 tablet (10 mg total) by mouth every 6 (six) hours as needed for nausea or vomiting., Disp: 30 tablet, Rfl: 1  "

## 2024-03-04 NOTE — Progress Notes (Signed)
 Outbound call to [facility] to check status of tissue being received.  Spoke to Brittany order placed 12/18, they requested tissue on 12/19.   [facility] usually f/u with patho lab every 3-4 business days.  They will follow up with them today and see if it can be sent, if it's sufficient or not sufficient.  If sample is not sufficient they'll reach out and see if another sample can be requested.   Dr. Melanee also requested to speak to pathology as well and see if there is enough specimen and when they plan to send it.  Outbound call to pathology [phone], spoke to Tammy in Client Services who confirmed they're is enough tissue to send and they plan on sending it today.

## 2024-03-05 ENCOUNTER — Telehealth: Payer: Self-pay

## 2024-03-05 NOTE — Telephone Encounter (Signed)
 Called IR stat [id number] and informed Patient agreed to above date & time.  Was told spot is still available.

## 2024-03-05 NOTE — Telephone Encounter (Signed)
 Patient in need of port placement.  Per Clarita Walterine Bence, I just had a cancellation for this Friday 12/26 at 9:30a an arrive at 8:30a.  Outbound call to patient; informed of above.  Patient agreed to above date / time.  Informed NPO after midnight, would need a driver present due to moderate sedation being involved, and [facility].

## 2024-03-06 NOTE — H&P (Signed)
 "    Chief Complaint: Patient was seen in consultation today for Port-A-Cath placement in assistance with chemotherapy administration for palliative treatment of metastatic esophageal cancer.  Referring Provider(s): Dr. Annah Skene, MD   Supervising Physician: Johann Sieving  Patient Status: [facility] - Out-pt  Patient is Full Code  History of Present Illness: Casey Acevedo is a 83 y.o. 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  Other pertinent medical history is as delineated below.  Patient is known to IR service, having most recently undergone an US  guided liver lesion biopsy on 12/16 by Dr. Luverne.  Per Dr. Darold progress note dated 12/23: Assessment and plan- Patient is a 83 y.o. male here for discussion of following issues:   Metastatic prostate cancer: Currently under good control with ADT plus apalutamide .  PSA remains undetectable.  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  He had neoadjuvant concurrent chemoradiation followed by surgery for esophageal squamous cell carcinoma back in October 2018 and he was in remission since then.  CEA is presently elevated and PET scan shows multiple areas of liver and lymph node metastases.  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  We are awaiting Tempus testing results to determine CPS score and other actionable mutations.  I would like to offer palliative FOLFOX chemotherapy for him which we will start in 2 weeks.  Port placement prior.  [...] Treatment will be given as a palliative intent. [...]   I will see him on 03/19/2024 with CBC with differential CMP  CEA and he will receive cycle 1 of FOLFOX chemotherapy at that time.  Interventional Radiology was requested for Port-A-Cath placement. Patient is scheduled for same in IR today.   Patient is alert and laying in bed, calm. His wife is at the bedside. Patient is currently without any significant complaints. He feels well, aside from a runny nose. Patient denies any fevers, headache, chest pain, SOB, cough, abdominal pain, nausea, vomiting or bleeding.     Past Medical History:  Diagnosis Date   Aortic atherosclerosis    Arthritis    Bladder tumor    Compression fracture of thoracic spine, non-traumatic (HCC) 01/28/2018   Coronary artery calcification    Dysphagia    Elevated liver enzymes    Esophageal cancer (HCC) 2018   Chemo + Rad tx's with surgical resection.    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 (HCC) 06/05/2017    Past Surgical History:  Procedure Laterality Date   CHOLECYSTECTOMY     COLONOSCOPY     COMPLETE ESOPHAGECTOMY N/A 06/18/2017   Procedure: cervical ESOPHAGECTOMY COMPLETE;  Surgeon: Army Dallas NOVAK, MD;  Location: [facility] OR;  Service: Thoracic;  Laterality: N/A;  NEED NIMS ET TUBE   ESOPHAGOGASTRODUODENOSCOPY (EGD) WITH PROPOFOL  N/A 12/07/2016   Procedure: ESOPHAGOGASTRODUODENOSCOPY (EGD) WITH PROPOFOL ;  Surgeon: Therisa Bi, MD;  Location: [facility] ENDOSCOPY;  Service: Gastroenterology;  Laterality: N/A;   ESOPHAGOGASTRODUODENOSCOPY (EGD) WITH PROPOFOL  N/A 12/26/2016   Procedure: ESOPHAGOGASTRODUODENOSCOPY (EGD) WITH PROPOFOL  WITH DILATION;  Surgeon: Therisa Bi, MD;  Location: [facility] ENDOSCOPY;  Service: Gastroenterology;  Laterality: N/A;   EYE SURGERY Bilateral    Cataracts  GASTROJEJUNOSTOMY N/A 01/16/2017   Procedure: LAPROSCOPIC ASSIST FEEDING JEJUNOSTOMY TUBE;  Surgeon: Gladis Cough, MD;  Location: [facility] ORS;  Service: General;  Laterality: N/A;   IR FLUORO GUIDE PORT INSERTION RIGHT  01/10/2017   IR REMOVAL TUN ACCESS W/ PORT W/O FL MOD SED   03/22/2018   IR REPLACE G-TUBE SIMPLE WO FLUORO  03/15/2017   IR REPLC DUODEN/JEJUNO TUBE PERCUT W/FLUORO  03/05/2017   KYPHOPLASTY N/A 08/31/2020   Procedure: L1 KYPHOPLASTY;  Surgeon: Kathlynn Sharper, MD;  Location: [facility] ORS;  Service: Orthopedics;  Laterality: N/A;   KYPHOPLASTY N/A 10/05/2020   Procedure: T12 KYPHOPLASTY;  Surgeon: Kathlynn Sharper, MD;  Location: [facility] ORS;  Service: Orthopedics;  Laterality: N/A;   PICC LINE INSERTION Right    PROSTATE BIOPSY N/A 05/03/2021   Procedure: PROSTATE BIOPSY;  Surgeon: Twylla Glendia BROCKS, MD;  Location: [facility] ORS;  Service: Urology;  Laterality: N/A;   PYLOROMYOTOMY N/A 06/18/2017   Procedure: JUDITHE;  Surgeon: Army Dallas NOVAK, MD;  Location: [facility] OR;  Service: Thoracic;  Laterality: N/A;   TRANSRECTAL ULTRASOUND N/A 05/03/2021   Procedure: TRANSRECTAL ULTRASOUND;  Surgeon: Twylla Glendia BROCKS, MD;  Location: [facility] ORS;  Service: Urology;  Laterality: N/A;   TRANSURETHRAL RESECTION OF BLADDER TUMOR N/A 02/15/2021   Procedure: TRANSURETHRAL RESECTION OF BLADDER TUMOR (TURBT);  Surgeon: Twylla Glendia BROCKS, MD;  Location: [facility] ORS;  Service: Urology;  Laterality: N/A;   TRANSURETHRAL RESECTION OF BLADDER TUMOR WITH MITOMYCIN -C N/A 01/13/2020   Procedure: TRANSURETHRAL RESECTION OF BLADDER TUMOR WITH gemcitabine ;  Surgeon: Twylla Glendia BROCKS, MD;  Location: [facility] ORS;  Service: Urology;  Laterality: N/A;   TRANSURETHRAL RESECTION OF PROSTATE N/A 05/03/2021   Procedure: TRANSURETHRAL RESECTION OF THE PROSTATE (TURP);  Surgeon: Twylla Glendia BROCKS, MD;  Location: [facility] ORS;  Service: Urology;  Laterality: N/A;   VIDEO BRONCHOSCOPY N/A 06/18/2017   Procedure: VIDEO BRONCHOSCOPY;  Surgeon: Army Dallas NOVAK, MD;  Location: [facility] OR;  Service: Thoracic;  Laterality: N/A;    Allergies: Tramadol   Medications: Prior to Admission medications  Medication Sig Start Date End Date Taking? Authorizing Provider  Acetaminophen  500 MG capsule Take by mouth.    [provider]  aspirin 325 MG tablet Take 325 mg by mouth daily as needed for mild pain (pain score 1-3).    [provider]  Calcium Carb-Cholecalciferol 500-10 MG-MCG TABS Take 1 tablet by mouth daily.    [provider]  dexamethasone  (DECADRON ) 4 MG tablet Take 2 tablets (8 mg total) by mouth daily. Start the day after chemotherapy for 2 days. Take with food. 03/04/24   Melanee Annah BROCKS, MD  ERLEADA  60 MG tablet TAKE 3 TABLETS BY MOUTH EVERY DAY 02/11/24   Rao, Archana C, MD  lidocaine -prilocaine  (EMLA ) cream Apply to affected area once 03/04/24   Rao, Archana C, MD  Multiple Vitamin (MULTIVITAMIN) tablet Take 1 tablet by mouth daily.    [provider]  ondansetron  (ZOFRAN ) 8 MG tablet Take 1 tablet (8 mg total) by mouth every 8 (eight) hours as needed for nausea or vomiting. 01/22/24   Borders, Fonda SAUNDERS, NP  ondansetron  (ZOFRAN ) 8 MG tablet Take 1 tablet (8 mg total) by mouth every 8 (eight) hours as needed for nausea or vomiting. Start on the third day after chemotherapy. 03/04/24   Melanee Annah BROCKS, MD  oxyCODONE  (OXY IR/ROXICODONE ) 5 MG immediate release tablet Take 1 tablet (5 mg total) by mouth every 4 (four) hours as needed for severe pain (pain score 7-10).  01/22/24   Borders, Fonda SAUNDERS, NP  prochlorperazine  (COMPAZINE ) 10 MG tablet Take 1 tablet (10 mg total) by mouth every 6 (six) hours as needed for nausea or vomiting. 08/08/23   Borders, Fonda SAUNDERS, NP  prochlorperazine  (COMPAZINE ) 10 MG tablet Take 1 tablet (10 mg total) by mouth every 6 (six) hours as needed for nausea or vomiting. 03/04/24   Melanee Annah BROCKS, MD     Family History  Problem Relation Age of Onset   Heart disease Father    Diabetes Mother    Hypertension Mother    Heart attack Brother 23   Prostate cancer Neg Hx    Bladder Cancer Neg Hx    Kidney cancer Neg Hx     Social History   Socioeconomic History   Marital status: Married    Spouse name: Not on file   Number of children: Not on  file   Years of education: Not on file   Highest education level: Not on file  Occupational History   Not on file  Tobacco Use   Smoking status: Never   Smokeless tobacco: Never  Vaping Use   Vaping status: Never Used  Substance and Sexual Activity   Alcohol use: No   Drug use: No   Sexual activity: Not Currently  Other Topics Concern   Not on file  Social History Narrative   Independent at baseline. Ambulatory   Social Drivers of Health   Tobacco Use: Low Risk (03/07/2024)   Patient History    Smoking Tobacco Use: Never    Smokeless Tobacco Use: Never    Passive Exposure: Not on file  Financial Resource Strain: Low Risk  (12/06/2023)   Received from [facility]   Overall Financial Resource Strain (CARDIA)    Difficulty of Paying Living Expenses: Not hard at all  Food Insecurity: No Food Insecurity (12/06/2023)   Received from [facility]   Epic    Within the past 12 months, you worried that your food would run out before you got the money to buy more.: Never true    Within the past 12 months, the food you bought just didn't last and you didn't have money to get more.: Never true  Transportation Needs: No Transportation Needs (12/06/2023)   Received from [facility] - Transportation    In the past 12 months, has lack of transportation kept you from medical appointments or from getting medications?: No    Lack of Transportation (Non-Medical): No  Physical Activity: Not on file  Stress: Not on file  Social Connections: Not on file  Depression (PHQ2-9): Low Risk (03/04/2024)   Depression (PHQ2-9)    PHQ-2 Score: 0  Alcohol Screen: Not on file  Housing: Low Risk  (12/06/2023)   Received from [facility]   Epic    In the last 12 months, was there a time when you were not able to pay the mortgage or rent on time?: No    In the past 12 months, how many times have you moved where you were living?:  0    At any time in the past 12 months, were you homeless or living in a shelter (including now)?: No  Utilities: Not At Risk (12/06/2023)   Received from [facility]   Epic    In the past 12 months has the electric, gas, oil, or water  company threatened to shut off services in your home?: No  Health Literacy: Adequate Health Literacy (04/13/2023)   B1300 Health Literacy    Frequency of need for help with medical instructions: Never     Review of Systems: A 12 point ROS discussed and pertinent positives are indicated in the HPI above.  All other systems are negative.  Vital Signs: BP (!) 152/73   Pulse 75   Temp (!) 96.6 F (35.9 C) (Temporal)   Resp 15   Ht 5' 8 (1.727 m)   Wt 152 lb 8 oz (69.2 kg)   SpO2 98%   BMI 23.19 kg/m   Advance Care Plan: The advanced care place/surrogate decision maker was discussed at the time of visit and the patient did not wish to discuss or was not able to name a surrogate decision maker or provide an advance care plan.  Physical Exam Vitals reviewed.  Constitutional:      General: He is not in acute distress.    Appearance: Normal appearance.  HENT:     Mouth/Throat:     Mouth: Mucous membranes are dry.  Cardiovascular:     Rate and Rhythm: Normal rate and regular rhythm.  Pulmonary:     Effort: Pulmonary effort is normal.  Abdominal:     General: Abdomen is flat.  Musculoskeletal:        General: Normal range of motion.     Cervical back: Normal range of motion.  Skin:    General: Skin is warm and dry.  Neurological:     Mental Status: He is alert and oriented to person, place, and time.  Psychiatric:        Mood and Affect: Mood normal.        Behavior: Behavior normal.        Thought Content: Thought content normal.        Judgment: Judgment normal.     Imaging: US  BIOPSY (LIVER) Result Date: 02/26/2024 INDICATION: History of squamous esophageal carcinoma and prostate carcinoma. Multiple liver lesions  concerning for metastatic disease. The patient presents for liver lesion biopsy. EXAM: ULTRASOUND GUIDED CORE BIOPSY OF LIVER MEDICATIONS: None. ANESTHESIA/SEDATION: Moderate (conscious) sedation was employed during this procedure. A total of Versed  1.0 mg and Fentanyl  50 mcg was administered intravenously. Moderate Sedation Time: 16 minutes. The patient's level of consciousness and vital signs were monitored continuously by radiology nursing throughout the procedure under my direct supervision. PROCEDURE: The procedure, risks, benefits, and alternatives were explained to the patient. Questions regarding the procedure were encouraged and answered. The patient understands and consents to the procedure. A time out was performed prior to initiating the procedure. The abdominal wall was prepped with chlorhexidine  in a sterile fashion, and a sterile drape was applied covering the operative field. A sterile gown and sterile gloves were used for the procedure. Local anesthesia was provided with 1% Lidocaine . Ultrasound was used to localize liver lesions. A lesion was chosen for sampling. Under ultrasound guidance, a 17 gauge trocar needle was advanced to the edge of the liver lesion. Three separate coaxial 18 gauge core biopsy samples were obtained and submitted in formalin. A slurry of Gel-Foam EmboCubes mixed in sterile saline was then injected through the outer needle as the needle was retracted and removed. Additional ultrasound was performed. COMPLICATIONS: None immediate. FINDINGS: Relatively hyperechoic lesions are identified in the liver. The most visible and accessible lesion is a lesion in the right lobe measuring approximately 2.1 x 2.0 x 1.6 cm. This lesion was sampled with solid tissue obtained. IMPRESSION: Ultrasound-guided core biopsy  performed of a lesion in the right lobe of the liver measuring up to 2.1 cm in maximum diameter by ultrasound. Electronically Signed   By: Marcey Moan M.D.   On: 02/26/2024  10:56   NM PET Image Initial (PI) Skull Base To Thigh Result Date: 02/12/2024 CLINICAL DATA:  Subsequent treatment strategy for esophageal cancer. EXAM: NUCLEAR MEDICINE PET SKULL BASE TO THIGH TECHNIQUE: 8.21 mCi F-18 FDG was injected intravenously. Full-ring PET imaging was performed from the skull base to thigh after the radiotracer. CT data was obtained and used for attenuation correction and anatomic localization. Fasting blood glucose: 100 mg/dl COMPARISON:  PET-CT 97/98/7980. CT of the chest, abdomen and pelvis 01/18/2024. Whole-body bone scan 12/26/2023. FINDINGS: Mediastinal blood pool activity: SUV max 2.4 NECK: No hypermetabolic cervical lymph nodes are identified.New focal hypermetabolic supraglottic activity in the region of the right piriformis sinus (SUV max 6.3). No other suspicious activity identified within the pharyngeal mucosal space. Incidental CT findings: Bilateral carotid atherosclerosis. CHEST: There are no hypermetabolic mediastinal, hilar or axillary lymph nodes. No hypermetabolic activity associated with the proximal anastomosis status post esophagectomy and gastric pull-through. No hypermetabolic pulmonary activity. The new right upper lobe nodule measuring 4 mm on image 48/6 is unchanged from the recent CT, too small to evaluate by PET-CT. Incidental CT findings: Calcified right hilar and mediastinal lymph nodes. Atherosclerosis of the aorta, great vessels and coronary arteries. Calcifications of the aortic valve. Stable hiatal hernia containing portions of the splenic flexure of the colon. ABDOMEN/PELVIS: New widespread hepatic metastatic disease with multiple ill-defined hypoattenuating liver lesions with associated hypermetabolic activity. Representative lesions include a 2.2 cm lesion anteriorly in the dome of the right lobe (segment 8) with an SUV max of 7.6 (image 73/6), a 2.2 cm lesion in segment 7 with an SUV max of 6.5 (image 74/6), and a 1.7 cm lesion in segment 2 with an  SUV max of 6.4 (image 83/6). There are multiple additional smaller lesions. The recently identified enlarging soft tissue mass posterior to the gastric pull-through at the level of the diaphragmatic crus demonstrates marked hypermetabolic activity. This mass measures 4.0 x 2.5 cm on image 84/6 and has an SUV max of 9.6. There are other smaller hypermetabolic lymph nodes in the gastrohepatic ligament and porta hepatis. The most inferior nodule is a left periaortic node at the level of the renal vessels, measuring 1.2 cm on image 94/6 (SUV max 7.2). No hypermetabolic activity within the spleen, adrenal glands or pancreas. No peritoneal disease identified. Incidental CT findings: Status post cholecystectomy. Nonobstructing bilateral renal calculi. Aortic and branch vessel atherosclerosis without evidence of aneurysm. Fiducial markers noted in the prostate gland. SKELETON: There is no hypermetabolic activity to suggest osseous metastatic disease. Incidental CT findings: Multiple thoracolumbar compression deformities status post spinal augmentation at T12 and L1. No significant metabolic activity associated with the new fractures at T6, L2 and L3 on recent CT. Previously demonstrated scattered sclerotic osseous lesions do not demonstrate increased metabolic activity. IMPRESSION: 1. The recently identified enlarging soft tissue mass posterior to the gastric pull-through at the level of the diaphragmatic crus demonstrates marked hypermetabolic activity consistent with metastatic disease. 2. There are other smaller hypermetabolic lymph nodes in the gastrohepatic ligament and porta hepatis consistent with metastatic disease. 3. New widespread hepatic metastatic disease. 4. The new right upper lobe nodule measuring 4 mm is too small to evaluate by PET-CT and could reflect a metastasis. No hypermetabolic activity identified within the thorax. 5. New focal hypermetabolic activity in  the supraglottic region in the region of the  right piriformis sinus. Recommend correlation with direct visualization to exclude a mucosal lesion. 6. No evidence of metabolically active osseous metastatic disease. Multiple thoracolumbar compression deformities status post spinal augmentation at T12 and L1. No significant metabolic activity associated with the new fractures at T6, L2 and L3 on recent CT. 7.  Aortic Atherosclerosis (ICD10-I70.0). Electronically Signed   By: Elsie Perone M.D.   On: 02/12/2024 14:27    Labs:  CBC: Recent Labs    10/31/23 0948 02/01/24 0910 02/21/24 0942 02/26/24 0833  WBC 4.7 4.3 5.5 5.0  HGB 13.4 13.0 13.8 13.9  HCT 41.4 39.5 43.4 42.9  PLT 234 225 258 250    COAGS: Recent Labs    02/21/24 0942 02/26/24 0833  INR 0.9 0.9    BMP: Recent Labs    06/15/23 0905 08/01/23 1051 10/31/23 0947 02/01/24 0910  NA 141 137 140 140  K 4.1 4.4 4.3 4.4  CL 108 107 110 108  CO2 24 23 23  21*  GLUCOSE 85 128* 99 148*  BUN 19 22 21 21   CALCIUM 9.0 8.6* 9.3 8.8*  CREATININE 1.20 1.28* 1.19 1.47*  GFRNONAA >60 56* >60 47*    LIVER FUNCTION TESTS: Recent Labs    06/15/23 0905 08/01/23 1051 10/31/23 0947 02/01/24 0910  BILITOT 0.7 0.5 0.6 0.4  AST 13* 12* 12* 14*  ALT 15 12 12 12   ALKPHOS 93 92 72 79  PROT 6.9 6.9 6.8 6.7  ALBUMIN  4.0 3.8 4.1 4.0    TUMOR MARKERS: No results for input(s): AFPTM, CEA, CA199, CHROMGRNA in the last 8760 hours.  Assessment and Plan: Per Dr. Darold progress note dated 12/23: Assessment and plan- Patient is a 83 y.o. male here for discussion of following issues:   Metastatic prostate cancer: Currently under good control with ADT plus apalutamide .  PSA remains undetectable.  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  He had neoadjuvant concurrent chemoradiation followed by surgery for esophageal squamous cell carcinoma back in October 2018 and he was in remission since  then.  CEA is presently elevated and PET scan shows multiple areas of liver and lymph node metastases.  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  We are awaiting Tempus testing results to determine CPS score and other actionable mutations.  I would like to offer palliative FOLFOX chemotherapy for him which we will start in 2 weeks.  Port placement prior.  [...] Treatment will be given as a palliative intent. [...]   I will see him on 03/19/2024 with CBC with differential CMP CEA and he will receive cycle 1 of FOLFOX chemotherapy at that time.   Patient presents for scheduled Port-A-Cath placement in IR today.  Patient has been NPO since midnight in anticipation of moderate sedation. All labs and medications are within acceptable parameters.  Allergies reviewed: Tramadol .  Risks and benefits of image guided port-a-catheter placement was discussed with the patient including, but not limited to bleeding, infection, pneumothorax, or fibrin sheath development and need for additional procedures.  All of the patient's questions were answered, patient is agreeable to proceed.  Consent signed and in chart.    Thank you for allowing our service to participate in JOSEMANUEL EAKINS 's care.  Electronically Signed: Carlin DELENA Griffon,  PA-C   03/07/2024, 8:56 AM      I spent a total of 40 Minutes  in face to face in clinical consultation, greater than 50% of which was counseling/coordinating care for Port-A-Cath placement in assistance with chemotherapy administration for palliative treatment of metastatic esophageal cancer.   "

## 2024-03-07 ENCOUNTER — Other Ambulatory Visit: Payer: Self-pay

## 2024-03-07 ENCOUNTER — Ambulatory Visit
Admission: RE | Admit: 2024-03-07 | Discharge: 2024-03-07 | Disposition: A | Discharge status: Home / Self Care | Source: Ambulatory Visit | Attending: Oncology | Admitting: Oncology

## 2024-03-07 ENCOUNTER — Encounter: Payer: Self-pay | Admitting: Radiology

## 2024-03-07 VITALS — BP 110/60 | HR 80 | Temp 97.3°F | Resp 11 | Ht 68.0 in | Wt 152.5 lb

## 2024-03-07 DIAGNOSIS — C772 Secondary and unspecified malignant neoplasm of intra-abdominal lymph nodes: Secondary | ICD-10-CM | POA: Insufficient documentation

## 2024-03-07 DIAGNOSIS — Z79899 Other long term (current) drug therapy: Secondary | ICD-10-CM | POA: Insufficient documentation

## 2024-03-07 DIAGNOSIS — C159 Malignant neoplasm of esophagus, unspecified: Secondary | ICD-10-CM | POA: Insufficient documentation

## 2024-03-07 DIAGNOSIS — C787 Secondary malignant neoplasm of liver and intrahepatic bile duct: Secondary | ICD-10-CM | POA: Diagnosis not present

## 2024-03-07 DIAGNOSIS — Z01818 Encounter for other preprocedural examination: Secondary | ICD-10-CM

## 2024-03-07 DIAGNOSIS — Z7952 Long term (current) use of systemic steroids: Secondary | ICD-10-CM | POA: Diagnosis not present

## 2024-03-07 DIAGNOSIS — C7982 Secondary malignant neoplasm of genital organs: Secondary | ICD-10-CM | POA: Insufficient documentation

## 2024-03-07 HISTORY — PX: IR IMAGING GUIDED PORT INSERTION: IMG5740

## 2024-03-07 MED ORDER — LIDOCAINE-EPINEPHRINE 1 %-1:100000 IJ SOLN
INTRAMUSCULAR | Status: AC
  Filled 2024-03-07: qty 1

## 2024-03-07 MED ORDER — FENTANYL CITRATE (PF) 100 MCG/2ML IJ SOLN
INTRAMUSCULAR | Status: AC | PRN
  Administered 2024-03-07: 50 ug via INTRAVENOUS

## 2024-03-07 MED ORDER — SODIUM CHLORIDE 0.9 % IV SOLN: INTRAVENOUS | Status: DC

## 2024-03-07 MED ORDER — FENTANYL CITRATE (PF) 100 MCG/2ML IJ SOLN
INTRAMUSCULAR | Status: AC
  Filled 2024-03-07: qty 2

## 2024-03-07 MED ORDER — HEPARIN SOD (PORK) LOCK FLUSH 100 UNIT/ML IV SOLN
INTRAVENOUS | Status: AC
  Filled 2024-03-07: qty 5

## 2024-03-07 MED ORDER — MIDAZOLAM HCL 5 MG/5ML IJ SOLN
INTRAMUSCULAR | Status: AC | PRN
  Administered 2024-03-07: 1 mg via INTRAVENOUS

## 2024-03-07 MED ORDER — MIDAZOLAM HCL 2 MG/2ML IJ SOLN
INTRAMUSCULAR | Status: AC
  Filled 2024-03-07: qty 2

## 2024-03-07 MED ORDER — HEPARIN SOD (PORK) LOCK FLUSH 100 UNIT/ML IV SOLN
500.0000 [IU] | Freq: Once | INTRAVENOUS | Status: AC
  Administered 2024-03-07: 500 [IU] via INTRAVENOUS

## 2024-03-07 MED ORDER — LIDOCAINE-EPINEPHRINE 1 %-1:100000 IJ SOLN
20.0000 mL | Freq: Once | INTRAMUSCULAR | Status: AC
  Administered 2024-03-07: 20 mL via INTRADERMAL

## 2024-03-07 NOTE — Progress Notes (Signed)
 Patient clinically stable post IR Port placement per Dr Johann, tolerated well. Vitals stable post procedure. Received Versed  1 mg along with Fentanyl  50 mcg IV for procedure.report given to Jann Parker RN post procedure/specials/16

## 2024-03-07 NOTE — Procedures (Signed)
" °  Procedure:  R EJ Port catheter tip prox RA Preprocedure diagnosis: The primary encounter diagnosis was Pre-op exam. A diagnosis of Stage IV malignant neoplasm of esophagus (HCC) was also pertinent to this visit. Postprocedure diagnosis: same EBL:    minimal Complications:   none immediate  See full dictation in [employer].  CHARM Toribio Faes MD Main # [phone] Pager  [phone] Mobile [phone]    "

## 2024-03-07 NOTE — Discharge Instructions (Signed)
 Implanted [facility] Guide  An implanted port is a type of central line that is placed under the skin. Central lines are used to provide IV access when treatment or nutrition needs to be given through a persons veins. Implanted ports are used for long-term IV access. An implanted port may be placed because: You need IV medicine that would be irritating to the small veins in your hands or arms. You need long-term IV medicines, such as antibiotics. You need IV nutrition for a long period. You need frequent blood draws for lab tests. You need dialysis.   Implanted ports are usually placed in the chest area, but they can also be placed in the upper arm, the abdomen, or the leg. An implanted port has two main parts: Reservoir. The reservoir is round and will appear as a small, raised area under your skin. The reservoir is the part where a needle is inserted to give medicines or draw blood. Catheter. The catheter is a thin, flexible tube that extends from the reservoir. The catheter is placed into a large vein. Medicine that is inserted into the reservoir goes into the catheter and then into the vein.   How will I care for my incision  You may shower today no rubbing over site Please remove dressing in 24hrs not other skin care is needed  How is my port accessed? Special steps must be taken to access the port: Before the port is accessed, a numbing cream can be placed on the skin. This helps numb the skin over the port site. Your health care provider uses a sterile technique to access the port. Your health care provider must put on a mask and sterile gloves. The skin over your port is cleaned carefully with an antiseptic and allowed to dry. The port is gently pinched between sterile gloves, and a needle is inserted into the port. Only non-coring port needles should be used to access the port. Once the port is accessed, a blood return should be checked. This helps ensure that the port is in the  vein and is not clogged. If your port needs to remain accessed for a constant infusion, a clear (transparent) bandage will be placed over the needle site. The bandage and needle will need to be changed every week, or as directed by your health care provider.   What is flushing? Flushing helps keep the port from getting clogged. Follow your health care providers instructions on how and when to flush the port. Ports are usually flushed with saline solution or a medicine called heparin . The need for flushing will depend on how the port is used. If the port is used for intermittent medicines or blood draws, the port will need to be flushed: After medicines have been given. After blood has been drawn. As part of routine maintenance. If a constant infusion is running, the port may not need to be flushed.   How long will my port stay implanted? The port can stay in for as long as your health care provider thinks it is needed. When it is time for the port to come out, surgery will be done to remove it. The procedure is similar to the one performed when the port was put in. When should I seek immediate medical care? When you have an implanted port, you should seek immediate medical care if: You notice a bad smell coming from the incision site. You have swelling, redness, or drainage at the incision site. You have more swelling  or pain at the port site or the surrounding area. You have a fever that is not controlled with medicine.   This information is not intended to replace advice given to you by your health care provider. Make sure you discuss any questions you have with your health care provider. Document Released: 02/27/2005 Document Revised: 08/05/2015 Document Reviewed: 11/04/2012 Elsevier Interactive Patient Education  2017 [employer].

## 2024-03-10 NOTE — Progress Notes (Signed)
 Pharmacist Chemotherapy Monitoring - Initial Assessment    Anticipated start date: 03/19/24   The following has been reviewed per standard work regarding the patient's treatment regimen: The patient's diagnosis, treatment plan and drug doses, and organ/hematologic function Lab orders and baseline tests specific to treatment regimen  The treatment plan start date, drug sequencing, and pre-medications Prior authorization status  Patient's documented medication list, including drug-drug interaction screen and prescriptions for anti-emetics and supportive care specific to the treatment regimen The drug concentrations, fluid compatibility, administration routes, and timing of the medications to be used The patient's access for treatment and lifetime cumulative dose history, if applicable  The patient's medication allergies and previous infusion related reactions, if applicable   Changes made to treatment plan:  N/A  Follow up needed:  Pending authorization for treatment  and tempus testing pending   Maudie FORBES Andreas, [facility], 03/10/2024  3:24 PM

## 2024-03-18 ENCOUNTER — Telehealth: Payer: Self-pay | Admitting: Pharmacy Technician

## 2024-03-18 NOTE — Telephone Encounter (Addendum)
 Oral Oncology Patient Advocate Encounter   Was successful in securing patient a $6,000 grant from [employer] ([employer]) to provide copayment coverage for erleada .  This will keep the out of pocket expense at $0.     The billing information is as follows and will be shared with [facility].   RxBin: [id number] PCN:  [id number] Member ID: [id number] Group ID: [id number] Dates of Eligibility: 09/20/2023 through 03/18/2025    Sims Laday ([name]) [name], [name]  [facility], [name], [facility] - Oral Chemotherapy Patient Advocate Specialist III Phone: [phone]  Fax: [phone]

## 2024-03-19 ENCOUNTER — Inpatient Hospital Stay: Admitting: Oncology

## 2024-03-19 ENCOUNTER — Inpatient Hospital Stay

## 2024-03-19 ENCOUNTER — Inpatient Hospital Stay: Attending: Oncology

## 2024-03-19 ENCOUNTER — Telehealth: Payer: Self-pay

## 2024-03-19 ENCOUNTER — Other Ambulatory Visit: Payer: Self-pay | Admitting: Oncology

## 2024-03-19 DIAGNOSIS — C159 Malignant neoplasm of esophagus, unspecified: Secondary | ICD-10-CM

## 2024-03-19 NOTE — Telephone Encounter (Signed)
 Treatment plan is still pending authorization as of this am; patient scheduled to have first treatment today at 11am.  Per Dr. Melanee inform of above and cancel today's appointment.  Outbound call; informed of above.  Patient said he was on his way, I apologized for the inconvenience. Informed once we receive an update regarding auth we will contact him to reschedule.  Asked patient if he used my chart and he indicated not often.  Asked scheduling team to call patient directly to reschedule.

## 2024-03-19 NOTE — Progress Notes (Signed)
 Pharmacist Chemotherapy Monitoring - Initial Assessment    Anticipated start date: 03/25/24   The following has been reviewed per standard work regarding the patient's treatment regimen: The patient's diagnosis, treatment plan and drug doses, and organ/hematologic function Lab orders and baseline tests specific to treatment regimen  The treatment plan start date, drug sequencing, and pre-medications Prior authorization status  Patient's documented medication list, including drug-drug interaction screen and prescriptions for anti-emetics and supportive care specific to the treatment regimen The drug concentrations, fluid compatibility, administration routes, and timing of the medications to be used The patient's access for treatment and lifetime cumulative dose history, if applicable  The patient's medication allergies and previous infusion related reactions, if applicable  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  awaiting Tempus testing results to determine CPS score and other actionable mutations. Start  palliative FOLFOX chemotherapy  Changes made to treatment plan:  N/A  Follow up needed:  Check renal function to access if oxaliplatin needs adjustment   Casey Acevedo, RPH, 03/19/2024  3:28 PM

## 2024-03-21 ENCOUNTER — Inpatient Hospital Stay

## 2024-03-21 NOTE — Telephone Encounter (Signed)
 Oral Oncology Patient Advocate Encounter  [name] funding opened. PAP not needed.   Casey Acevedo ([name]) [name], [name]  [facility], [name], [facility] - Oral Chemotherapy Patient Advocate Specialist III Phone: [phone]  Fax: [phone]

## 2024-03-24 ENCOUNTER — Inpatient Hospital Stay: Attending: Oncology | Admitting: Hospice and Palliative Medicine

## 2024-03-24 ENCOUNTER — Telehealth: Payer: Self-pay | Admitting: Pharmacy Technician

## 2024-03-24 DIAGNOSIS — Z5112 Encounter for antineoplastic immunotherapy: Secondary | ICD-10-CM | POA: Insufficient documentation

## 2024-03-24 DIAGNOSIS — G893 Neoplasm related pain (acute) (chronic): Secondary | ICD-10-CM | POA: Insufficient documentation

## 2024-03-24 DIAGNOSIS — R112 Nausea with vomiting, unspecified: Secondary | ICD-10-CM | POA: Insufficient documentation

## 2024-03-24 DIAGNOSIS — Z5189 Encounter for other specified aftercare: Secondary | ICD-10-CM | POA: Insufficient documentation

## 2024-03-24 DIAGNOSIS — R0981 Nasal congestion: Secondary | ICD-10-CM | POA: Insufficient documentation

## 2024-03-24 DIAGNOSIS — R5381 Other malaise: Secondary | ICD-10-CM | POA: Insufficient documentation

## 2024-03-24 DIAGNOSIS — C61 Malignant neoplasm of prostate: Secondary | ICD-10-CM | POA: Insufficient documentation

## 2024-03-24 DIAGNOSIS — Z452 Encounter for adjustment and management of vascular access device: Secondary | ICD-10-CM | POA: Insufficient documentation

## 2024-03-24 DIAGNOSIS — C7951 Secondary malignant neoplasm of bone: Secondary | ICD-10-CM | POA: Diagnosis not present

## 2024-03-24 DIAGNOSIS — Z8501 Personal history of malignant neoplasm of esophagus: Secondary | ICD-10-CM | POA: Insufficient documentation

## 2024-03-24 DIAGNOSIS — Z5111 Encounter for antineoplastic chemotherapy: Secondary | ICD-10-CM | POA: Insufficient documentation

## 2024-03-24 DIAGNOSIS — J069 Acute upper respiratory infection, unspecified: Secondary | ICD-10-CM | POA: Insufficient documentation

## 2024-03-24 DIAGNOSIS — B974 Respiratory syncytial virus as the cause of diseases classified elsewhere: Secondary | ICD-10-CM | POA: Insufficient documentation

## 2024-03-24 DIAGNOSIS — Z515 Encounter for palliative care: Secondary | ICD-10-CM | POA: Insufficient documentation

## 2024-03-24 DIAGNOSIS — R059 Cough, unspecified: Secondary | ICD-10-CM | POA: Insufficient documentation

## 2024-03-24 DIAGNOSIS — C155 Malignant neoplasm of lower third of esophagus: Secondary | ICD-10-CM | POA: Insufficient documentation

## 2024-03-24 DIAGNOSIS — Z79620 Long term (current) use of immunosuppressive biologic: Secondary | ICD-10-CM | POA: Insufficient documentation

## 2024-03-24 DIAGNOSIS — R634 Abnormal weight loss: Secondary | ICD-10-CM | POA: Insufficient documentation

## 2024-03-24 DIAGNOSIS — C159 Malignant neoplasm of esophagus, unspecified: Secondary | ICD-10-CM

## 2024-03-24 DIAGNOSIS — Z79891 Long term (current) use of opiate analgesic: Secondary | ICD-10-CM | POA: Insufficient documentation

## 2024-03-24 MED ORDER — PROCHLORPERAZINE MALEATE 10 MG PO TABS: 10.0000 mg | ORAL_TABLET | Freq: Four times a day (QID) | ORAL | 2 refills | Status: DC | PRN

## 2024-03-24 MED ORDER — OXYCODONE HCL 5 MG PO TABS: 5.0000 mg | ORAL_TABLET | ORAL | 0 refills | Status: AC | PRN

## 2024-03-24 NOTE — Progress Notes (Signed)
 Virtual Visit via Telephone Note  I connected with Casey Acevedo on 06/08/23 at  by telephone and verified that I am speaking with the correct person using two identifiers.  Location: Patient: Home Provider: Clinic   I discussed the limitations, risks, security and privacy concerns of performing an evaluation and management service by telephone and the availability of in person appointments. I also discussed with the patient that there may be a patient responsible charge related to this service. The patient expressed understanding and agreed to proceed.   History of Present Illness: Casey Acevedo is a 84 y.o. 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  Patient currently on ADT and apalutamide .  Patient has had decreased quality of life with treatment.  Also has had ongoing pain.  Palliative care consulted to address goals manage ongoing symptoms.    Observations/Objective: I spoke with patient by phone.  PET scan on 02/12/2024 showed enlarging soft tissue mass at the diaphragmatic crus as well as surrounding hypermetabolic lymph nodes and new widespread hepatic metastatic disease.  Patient pending initiation of FOLFOX chemotherapy.  Patient voiced some concerns about symptom management with chemotherapy, which we discussed in detail.  He requested refills on antiemetics and oxycodone .  Patient states that he is trying to be optimistic and says he is going through hell but does not plan to stop.   Assessment and Plan: Metastatic prostate cancer -pending initiation of FOLFOX  Neoplasm related pain -  Refill oxycodone .  PDMP reviewed.  Daily bowel regimen to prevent opioid-induced constipation.  Follow Up Instructions: Follow-up telephone visit 1 to 2 months   I  discussed the assessment and treatment plan with the patient. The patient was provided an opportunity to ask questions and all were answered. The patient agreed with the plan and demonstrated an understanding of the instructions.   The patient was advised to call back or seek an in-person evaluation if the symptoms worsen or if the condition fails to improve as anticipated.  I provided 10 minutes of non-face-to-face time during this encounter.   FONDA JONELLE MOWER, NP

## 2024-03-24 NOTE — Telephone Encounter (Signed)
" ° °  Erleada  approval 2026  "

## 2024-03-25 ENCOUNTER — Inpatient Hospital Stay

## 2024-03-25 ENCOUNTER — Other Ambulatory Visit: Payer: Self-pay

## 2024-03-25 ENCOUNTER — Inpatient Hospital Stay: Admitting: Oncology

## 2024-03-25 ENCOUNTER — Encounter: Payer: Self-pay | Admitting: Oncology

## 2024-03-25 VITALS — BP 120/80 | HR 90 | Temp 96.0°F | Resp 19 | Ht 68.0 in | Wt 150.2 lb

## 2024-03-25 DIAGNOSIS — J069 Acute upper respiratory infection, unspecified: Secondary | ICD-10-CM | POA: Diagnosis not present

## 2024-03-25 DIAGNOSIS — Z452 Encounter for adjustment and management of vascular access device: Secondary | ICD-10-CM | POA: Diagnosis not present

## 2024-03-25 DIAGNOSIS — R059 Cough, unspecified: Secondary | ICD-10-CM | POA: Diagnosis not present

## 2024-03-25 DIAGNOSIS — Z5111 Encounter for antineoplastic chemotherapy: Secondary | ICD-10-CM

## 2024-03-25 DIAGNOSIS — C61 Malignant neoplasm of prostate: Secondary | ICD-10-CM | POA: Diagnosis not present

## 2024-03-25 DIAGNOSIS — B974 Respiratory syncytial virus as the cause of diseases classified elsewhere: Secondary | ICD-10-CM | POA: Diagnosis not present

## 2024-03-25 DIAGNOSIS — C159 Malignant neoplasm of esophagus, unspecified: Secondary | ICD-10-CM

## 2024-03-25 DIAGNOSIS — C7951 Secondary malignant neoplasm of bone: Secondary | ICD-10-CM | POA: Diagnosis not present

## 2024-03-25 DIAGNOSIS — Z515 Encounter for palliative care: Secondary | ICD-10-CM | POA: Diagnosis not present

## 2024-03-25 DIAGNOSIS — Z5112 Encounter for antineoplastic immunotherapy: Secondary | ICD-10-CM | POA: Diagnosis present

## 2024-03-25 DIAGNOSIS — Z5189 Encounter for other specified aftercare: Secondary | ICD-10-CM | POA: Diagnosis not present

## 2024-03-25 DIAGNOSIS — R0981 Nasal congestion: Secondary | ICD-10-CM | POA: Diagnosis not present

## 2024-03-25 DIAGNOSIS — R112 Nausea with vomiting, unspecified: Secondary | ICD-10-CM | POA: Diagnosis not present

## 2024-03-25 DIAGNOSIS — Z8501 Personal history of malignant neoplasm of esophagus: Secondary | ICD-10-CM | POA: Diagnosis not present

## 2024-03-25 DIAGNOSIS — G893 Neoplasm related pain (acute) (chronic): Secondary | ICD-10-CM

## 2024-03-25 DIAGNOSIS — Z79620 Long term (current) use of immunosuppressive biologic: Secondary | ICD-10-CM | POA: Diagnosis not present

## 2024-03-25 DIAGNOSIS — R634 Abnormal weight loss: Secondary | ICD-10-CM | POA: Diagnosis not present

## 2024-03-25 DIAGNOSIS — Z79891 Long term (current) use of opiate analgesic: Secondary | ICD-10-CM | POA: Diagnosis not present

## 2024-03-25 DIAGNOSIS — C155 Malignant neoplasm of lower third of esophagus: Secondary | ICD-10-CM | POA: Diagnosis not present

## 2024-03-25 DIAGNOSIS — R5381 Other malaise: Secondary | ICD-10-CM | POA: Diagnosis not present

## 2024-03-25 LAB — CMP (CANCER CENTER ONLY)
ALT: <5 U/L (ref 0–44)
AST: 12 U/L — ABNORMAL LOW (ref 15–41)
Albumin: 4.1 g/dL (ref 3.5–5.0)
Alkaline Phosphatase: 127 U/L — ABNORMAL HIGH (ref 38–126)
Anion gap: 12 (ref 5–15)
BUN: 16 mg/dL (ref 8–23)
CO2: 22 mmol/L (ref 22–32)
Calcium: 9.2 mg/dL (ref 8.9–10.3)
Chloride: 107 mmol/L (ref 98–111)
Creatinine: 1.21 mg/dL (ref 0.61–1.24)
GFR, Estimated: 59 mL/min — ABNORMAL LOW
Glucose, Bld: 109 mg/dL — ABNORMAL HIGH (ref 70–99)
Potassium: 3.6 mmol/L (ref 3.5–5.1)
Sodium: 142 mmol/L (ref 135–145)
Total Bilirubin: 0.3 mg/dL (ref 0.0–1.2)
Total Protein: 6.8 g/dL (ref 6.5–8.1)

## 2024-03-25 LAB — CBC WITH DIFFERENTIAL (CANCER CENTER ONLY)
Abs Immature Granulocytes: 0.02 K/uL (ref 0.00–0.07)
Basophils Absolute: 0.1 K/uL (ref 0.0–0.1)
Basophils Relative: 1 %
Eosinophils Absolute: 0.4 K/uL (ref 0.0–0.5)
Eosinophils Relative: 6 %
HCT: 39.3 % (ref 39.0–52.0)
Hemoglobin: 13.2 g/dL (ref 13.0–17.0)
Immature Granulocytes: 0 %
Lymphocytes Relative: 23 %
Lymphs Abs: 1.5 K/uL (ref 0.7–4.0)
MCH: 31.1 pg (ref 26.0–34.0)
MCHC: 33.6 g/dL (ref 30.0–36.0)
MCV: 92.5 fL (ref 80.0–100.0)
Monocytes Absolute: 0.7 K/uL (ref 0.1–1.0)
Monocytes Relative: 10 %
Neutro Abs: 4.1 K/uL (ref 1.7–7.7)
Neutrophils Relative %: 60 %
Platelet Count: 212 K/uL (ref 150–400)
RBC: 4.25 MIL/uL (ref 4.22–5.81)
RDW: 13.2 % (ref 11.5–15.5)
WBC Count: 6.8 K/uL (ref 4.0–10.5)
nRBC: 0 % (ref 0.0–0.2)

## 2024-03-25 MED ORDER — DEXAMETHASONE SOD PHOSPHATE PF 10 MG/ML IJ SOLN
10.0000 mg | Freq: Once | INTRAMUSCULAR | Status: AC
  Administered 2024-03-25: 10 mg via INTRAVENOUS
  Filled 2024-03-25: qty 1

## 2024-03-25 MED ORDER — LEUCOVORIN CALCIUM INJECTION 350 MG
400.0000 mg/m2 | Freq: Once | INTRAVENOUS | Status: AC
  Administered 2024-03-25: 728 mg via INTRAVENOUS
  Filled 2024-03-25: qty 25

## 2024-03-25 MED ORDER — FLUOROURACIL CHEMO INJECTION 2.5 GM/50ML
400.0000 mg/m2 | Freq: Once | INTRAVENOUS | Status: AC
  Administered 2024-03-25: 750 mg via INTRAVENOUS
  Filled 2024-03-25: qty 15

## 2024-03-25 MED ORDER — SODIUM CHLORIDE 0.9 % IV SOLN
2400.0000 mg/m2 | INTRAVENOUS | Status: DC
  Administered 2024-03-25: 4350 mg via INTRAVENOUS
  Filled 2024-03-25: qty 87

## 2024-03-25 MED ORDER — DEXTROSE 5 % IV SOLN
INTRAVENOUS | Status: DC
  Filled 2024-03-25: qty 250

## 2024-03-25 MED ORDER — OXALIPLATIN CHEMO INJECTION 100 MG/20ML
65.0000 mg/m2 | Freq: Once | INTRAVENOUS | Status: AC
  Administered 2024-03-25: 120 mg via INTRAVENOUS
  Filled 2024-03-25: qty 24

## 2024-03-25 MED ORDER — PALONOSETRON HCL INJECTION 0.25 MG/5ML
0.2500 mg | Freq: Once | INTRAVENOUS | Status: AC
  Administered 2024-03-25: 0.25 mg via INTRAVENOUS
  Filled 2024-03-25: qty 5

## 2024-03-25 NOTE — Patient Instructions (Signed)

## 2024-03-25 NOTE — Progress Notes (Signed)
 Patient doing okay; no new or acute concerns at this time.

## 2024-03-25 NOTE — Patient Instructions (Signed)
 [facility] - A DEPT OF [facility]  Discharge Instructions: Thank you for choosing [facility] to provide your oncology and hematology care.  If you have a lab appointment with the Cancer Center, please go directly to the Cancer Center and check in at the registration area.  Wear comfortable clothing and clothing appropriate for easy access to any Portacath or PICC line.   We strive to give you quality time with your provider. You may need to reschedule your appointment if you arrive late (15 or more minutes).  Arriving late affects you and other patients whose appointments are after yours.  Also, if you miss three or more appointments without notifying the office, you may be dismissed from the clinic at the provider's discretion.      For prescription refill requests, have your pharmacy contact our office and allow 72 hours for refills to be completed.    Today you received the following chemotherapy and/or immunotherapy agents OXALIPLATIN, LEUCOVORIN. 5 FU      To help prevent nausea and vomiting after your treatment, we encourage you to take your nausea medication as directed.  BELOW ARE SYMPTOMS THAT SHOULD BE REPORTED IMMEDIATELY: *FEVER GREATER THAN 100.4 F (38 C) OR HIGHER *CHILLS OR SWEATING *NAUSEA AND VOMITING THAT IS NOT CONTROLLED WITH YOUR NAUSEA MEDICATION *UNUSUAL SHORTNESS OF BREATH *UNUSUAL BRUISING OR BLEEDING *URINARY PROBLEMS (pain or burning when urinating, or frequent urination) *BOWEL PROBLEMS (unusual diarrhea, constipation, pain near the anus) TENDERNESS IN MOUTH AND THROAT WITH OR WITHOUT PRESENCE OF ULCERS (sore throat, sores in mouth, or a toothache) UNUSUAL RASH, SWELLING OR PAIN  UNUSUAL VAGINAL DISCHARGE OR ITCHING   Items with * indicate a potential emergency and should be followed up as soon as possible or go to the Emergency Department if any problems should occur.  Please show the CHEMOTHERAPY ALERT CARD  or IMMUNOTHERAPY ALERT CARD at check-in to the Emergency Department and triage nurse.  Should you have questions after your visit or need to cancel or reschedule your appointment, please contact [facility] - A DEPT OF [facility]  [phone] and follow the prompts.  Office hours are 8:00 a.m. to 4:30 p.m. Monday - Friday. Please note that voicemails left after 4:00 p.m. may not be returned until the following business day.  We are closed weekends and major holidays. You have access to a nurse at all times for urgent questions. Please call the main number to the clinic [phone] and follow the prompts.  For any non-urgent questions, you may also contact your provider using MyChart. We now offer e-Visits for anyone 55 and older to request care online for non-urgent symptoms. For details visit [email or web address].   Also download the MyChart app! Go to the app store, search MyChart, open the app, select [facility], and log in with your MyChart username and password.  Oxaliplatin Injection What is this medication? OXALIPLATIN (ox AL i PLA tin) treats colorectal cancer. It works by slowing down the growth of cancer cells. This medicine may be used for other purposes; ask your health care provider or pharmacist if you have questions. COMMON BRAND NAME(S): Eloxatin What should I tell my care team before I take this medication? They need to know if you have any of these conditions: Heart disease History of irregular heartbeat or rhythm Liver disease Low blood cell levels (white cells, red cells, and platelets) Lung or breathing disease,  such as asthma Take medications that treat or prevent blood clots Tingling of the fingers, toes, or other nerve disorder An unusual or allergic reaction to oxaliplatin, other medications, foods, dyes, or preservatives If you or your partner are pregnant or trying to get pregnant Breast-feeding How should I use this  medication? This medication is injected into a vein. It is given by your care team in a hospital or clinic setting. Talk to your care team about the use of this medication in children. Special care may be needed. Overdosage: If you think you have taken too much of this medicine contact a poison control center or emergency room at once. NOTE: This medicine is only for you. Do not share this medicine with others. What if I miss a dose? Keep appointments for follow-up doses. It is important not to miss a dose. Call your care team if you are unable to keep an appointment. What may interact with this medication? Do not take this medication with any of the following: Cisapride Dronedarone Pimozide Thioridazine This medication may also interact with the following: Aspirin and aspirin-like medications Certain medications that treat or prevent blood clots, such as warfarin, apixaban, dabigatran, and rivaroxaban Cisplatin Cyclosporine Diuretics Medications for infection, such as acyclovir, adefovir, amphotericin B, bacitracin, cidofovir, foscarnet, ganciclovir, gentamicin, pentamidine, vancomycin NSAIDs, medications for pain and inflammation, such as ibuprofen or naproxen Other medications that cause heart rhythm changes Pamidronate Zoledronic acid This list may not describe all possible interactions. Give your health care provider a list of all the medicines, herbs, non-prescription drugs, or dietary supplements you use. Also tell them if you smoke, drink alcohol, or use illegal drugs. Some items may interact with your medicine. What should I watch for while using this medication? Your condition will be monitored carefully while you are receiving this medication. You may need blood work while taking this medication. This medication may make you feel generally unwell. This is not uncommon as chemotherapy can affect healthy cells as well as cancer cells. Report any side effects. Continue your course  of treatment even though you feel ill unless your care team tells you to stop. This medication may increase your risk of getting an infection. Call your care team for advice if you get a fever, chills, sore throat, or other symptoms of a cold or flu. Do not treat yourself. Try to avoid being around people who are sick. Avoid taking medications that contain aspirin, acetaminophen , ibuprofen, naproxen, or ketoprofen unless instructed by your care team. These medications may hide a fever. Be careful brushing or flossing your teeth or using a toothpick because you may get an infection or bleed more easily. If you have any dental work done, tell your dentist you are receiving this medication. This medication can make you more sensitive to cold. Do not drink cold drinks or use ice. Cover exposed skin before coming in contact with cold temperatures or cold objects. When out in cold weather wear warm clothing and cover your mouth and nose to warm the air that goes into your lungs. Tell your care team if you get sensitive to the cold. Talk to your care team if you or your partner are pregnant or think either of you might be pregnant. This medication can cause serious birth defects if taken during pregnancy and for 9 months after the last dose. A negative pregnancy test is required before starting this medication. A reliable form of contraception is recommended while taking this medication and for 9 months after  the last dose. Talk to your care team about effective forms of contraception. Do not father a child while taking this medication and for 6 months after the last dose. Use a condom while having sex during this time period. Do not breastfeed while taking this medication and for 3 months after the last dose. This medication may cause infertility. Talk to your care team if you are concerned about your fertility. What side effects may I notice from receiving this medication? Side effects that you should report to  your care team as soon as possible: Allergic reactions--skin rash, itching, hives, swelling of the face, lips, tongue, or throat Bleeding--bloody or black, tar-like stools, vomiting blood or brown material that looks like coffee grounds, red or dark brown urine, small red or purple spots on skin, unusual bruising or bleeding Dry cough, shortness of breath or trouble breathing Heart rhythm changes--fast or irregular heartbeat, dizziness, feeling faint or lightheaded, chest pain, trouble breathing Infection--fever, chills, cough, sore throat, wounds that don't heal, pain or trouble when passing urine, general feeling of discomfort or being unwell Liver injury--right upper belly pain, loss of appetite, nausea, light-colored stool, dark yellow or brown urine, yellowing skin or eyes, unusual weakness or fatigue Low red blood cell level--unusual weakness or fatigue, dizziness, headache, trouble breathing Muscle injury--unusual weakness or fatigue, muscle pain, dark yellow or brown urine, decrease in amount of urine Pain, tingling, or numbness in the hands or feet Sudden and severe headache, confusion, change in vision, seizures, which may be signs of posterior reversible encephalopathy syndrome (PRES) Unusual bruising or bleeding Side effects that usually do not require medical attention (report to your care team if they continue or are bothersome): Diarrhea Nausea Pain, redness, or swelling with sores inside the mouth or throat Unusual weakness or fatigue Vomiting This list may not describe all possible side effects. Call your doctor for medical advice about side effects. You may report side effects to FDA at 1-800-FDA-1088. Where should I keep my medication? This medication is given in a hospital or clinic. It will not be stored at home. NOTE: This sheet is a summary. It may not cover all possible information. If you have questions about this medicine, talk to your doctor, pharmacist, or health care  provider.  2024 Elsevier/Gold Standard (2023-02-09 00:00:00)  Leucovorin Injection What is this medication? LEUCOVORIN (loo koe VOR in) prevents side effects from certain medications, such as methotrexate. It works by increasing folate levels. This helps protect healthy cells in your body. It may also be used to treat anemia caused by low levels of folate. It can also be used with fluorouracil, a type of chemotherapy, to treat colorectal cancer. It works by increasing the effects of fluorouracil in the body. This medicine may be used for other purposes; ask your health care provider or pharmacist if you have questions. What should I tell my care team before I take this medication? They need to know if you have any of these conditions: Anemia from low levels of vitamin B12 in the blood An unusual or allergic reaction to leucovorin, folic acid, other medications, foods, dyes, or preservatives Pregnant or trying to get pregnant Breastfeeding How should I use this medication? This medication is injected into a vein or a muscle. It is given by your care team in a hospital or clinic setting. Talk to your care team about the use of this medication in children. Special care may be needed. Overdosage: If you think you have taken too much of  this medicine contact a poison control center or emergency room at once. NOTE: This medicine is only for you. Do not share this medicine with others. What if I miss a dose? Keep appointments for follow-up doses. It is important not to miss your dose. Call your care team if you are unable to keep an appointment. What may interact with this medication? Capecitabine Fluorouracil Phenobarbital Phenytoin Primidone Trimethoprim;sulfamethoxazole This list may not describe all possible interactions. Give your health care provider a list of all the medicines, herbs, non-prescription drugs, or dietary supplements you use. Also tell them if you smoke, drink alcohol, or  use illegal drugs. Some items may interact with your medicine. What should I watch for while using this medication? Your condition will be monitored carefully while you are receiving this medication. This medication may increase the side effects of 5-fluorouracil. Tell your care team if you have diarrhea or mouth sores that do not get better or that get worse. What side effects may I notice from receiving this medication? Side effects that you should report to your care team as soon as possible: Allergic reactions--skin rash, itching, hives, swelling of the face, lips, tongue, or throat This list may not describe all possible side effects. Call your doctor for medical advice about side effects. You may report side effects to FDA at 1-800-FDA-1088. Where should I keep my medication? This medication is given in a hospital or clinic. It will not be stored at home. NOTE: This sheet is a summary. It may not cover all possible information. If you have questions about this medicine, talk to your doctor, pharmacist, or health care provider.  2024 Elsevier/Gold Standard (2021-08-02 00:00:00)  Fluorouracil Injection What is this medication? FLUOROURACIL (flure oh YOOR a sil) treats some types of cancer. It works by slowing down the growth of cancer cells. This medicine may be used for other purposes; ask your health care provider or pharmacist if you have questions. COMMON BRAND NAME(S): Adrucil What should I tell my care team before I take this medication? They need to know if you have any of these conditions: Blood disorders Dihydropyrimidine dehydrogenase (DPD) deficiency Infection, such as chickenpox, cold sores, herpes Kidney disease Liver disease Poor nutrition Recent or ongoing radiation therapy An unusual or allergic reaction to fluorouracil, other medications, foods, dyes, or preservatives If you or your partner are pregnant or trying to get pregnant Breast-feeding How should I use this  medication? This medication is injected into a vein. It is administered by your care team in a hospital or clinic setting. Talk to your care team about the use of this medication in children. Special care may be needed. Overdosage: If you think you have taken too much of this medicine contact a poison control center or emergency room at once. NOTE: This medicine is only for you. Do not share this medicine with others. What if I miss a dose? Keep appointments for follow-up doses. It is important not to miss your dose. Call your care team if you are unable to keep an appointment. What may interact with this medication? Do not take this medication with any of the following: Live virus vaccines This medication may also interact with the following: Medications that treat or prevent blood clots, such as warfarin, enoxaparin, dalteparin This list may not describe all possible interactions. Give your health care provider a list of all the medicines, herbs, non-prescription drugs, or dietary supplements you use. Also tell them if you smoke, drink alcohol, or use illegal  drugs. Some items may interact with your medicine. What should I watch for while using this medication? Your condition will be monitored carefully while you are receiving this medication. This medication may make you feel generally unwell. This is not uncommon as chemotherapy can affect healthy cells as well as cancer cells. Report any side effects. Continue your course of treatment even though you feel ill unless your care team tells you to stop. In some cases, you may be given additional medications to help with side effects. Follow all directions for their use. This medication may increase your risk of getting an infection. Call your care team for advice if you get a fever, chills, sore throat, or other symptoms of a cold or flu. Do not treat yourself. Try to avoid being around people who are sick. This medication may increase your risk  to bruise or bleed. Call your care team if you notice any unusual bleeding. Be careful brushing or flossing your teeth or using a toothpick because you may get an infection or bleed more easily. If you have any dental work done, tell your dentist you are receiving this medication. Avoid taking medications that contain aspirin, acetaminophen , ibuprofen, naproxen, or ketoprofen unless instructed by your care team. These medications may hide a fever. Do not treat diarrhea with over the counter products. Contact your care team if you have diarrhea that lasts more than 2 days or if it is severe and watery. This medication can make you more sensitive to the sun. Keep out of the sun. If you cannot avoid being in the sun, wear protective clothing and sunscreen. Do not use sun lamps, tanning beds, or tanning booths. Talk to your care team if you or your partner wish to become pregnant or think you might be pregnant. This medication can cause serious birth defects if taken during pregnancy and for 3 months after the last dose. A reliable form of contraception is recommended while taking this medication and for 3 months after the last dose. Talk to your care team about effective forms of contraception. Do not father a child while taking this medication and for 3 months after the last dose. Use a condom while having sex during this time period. Do not breastfeed while taking this medication. This medication may cause infertility. Talk to your care team if you are concerned about your fertility. What side effects may I notice from receiving this medication? Side effects that you should report to your care team as soon as possible: Allergic reactions--skin rash, itching, hives, swelling of the face, lips, tongue, or throat Heart attack--pain or tightness in the chest, shoulders, arms, or jaw, nausea, shortness of breath, cold or clammy skin, feeling faint or lightheaded Heart failure--shortness of breath, swelling of  the ankles, feet, or hands, sudden weight gain, unusual weakness or fatigue Heart rhythm changes--fast or irregular heartbeat, dizziness, feeling faint or lightheaded, chest pain, trouble breathing High ammonia level--unusual weakness or fatigue, confusion, loss of appetite, nausea, vomiting, seizures Infection--fever, chills, cough, sore throat, wounds that don't heal, pain or trouble when passing urine, general feeling of discomfort or being unwell Low red blood cell level--unusual weakness or fatigue, dizziness, headache, trouble breathing Pain, tingling, or numbness in the hands or feet, muscle weakness, change in vision, confusion or trouble speaking, loss of balance or coordination, trouble walking, seizures Redness, swelling, and blistering of the skin over hands and feet Severe or prolonged diarrhea Unusual bruising or bleeding Side effects that usually do not require medical  attention (report to your care team if they continue or are bothersome): Dry skin Headache Increased tears Nausea Pain, redness, or swelling with sores inside the mouth or throat Sensitivity to light Vomiting This list may not describe all possible side effects. Call your doctor for medical advice about side effects. You may report side effects to FDA at 1-800-FDA-1088. Where should I keep my medication? This medication is given in a hospital or clinic. It will not be stored at home. NOTE: This sheet is a summary. It may not cover all possible information. If you have questions about this medicine, talk to your doctor, pharmacist, or health care provider.  2024 Elsevier/Gold Standard (2021-07-05 00:00:00)

## 2024-03-25 NOTE — Progress Notes (Signed)
 "    Hematology/Oncology Consult note [facility]  [phone] [phone]  Patient Care Team: Auston Reyes BIRCH, MD as PCP - General (Internal Medicine) End, Lonni, MD as PCP - Cardiology (Cardiology) Army Dallas NOVAK, MD (Inactive) as Consulting Physician (Cardiothoracic Surgery) Melanee Annah BROCKS, MD as Consulting Physician (Oncology) Lenn Aran, MD as Referring Physician (Radiation Oncology)   Name of the patient: Casey Acevedo  [id number]  10-Dec-1940   Date of visit: 03/25/2024  Diagnosis- 1.  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  Metastatic castrate sensitive prostate cancer  Chief complaint/ Reason for visit-on treatment assessment prior to cycle 1 of palliative FOLFOX chemotherapy  Heme/Onc history:  Patient is a 84 year old male 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 Pathology showed no residual carcinoma at primary site. 1/5 LN positive for malignancy. 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  Patient currently remains in remission    Patient was diagnosed with high-grade prostate cancer in 2021.  Initial PSA was 14.  It was a stage IIIc adenocarcinoma with a Gleason's 9.  He received IMRT radiation therapy following which his PSA declined to 0.05.  He received 1 dose of Lupron  in April 2023 but none after that.  His PSA was 0.05 a year ago and then went up to 0.226 months ago and is presently at 10.9. CT chest abdomen and pelvis with contrast showed multifocal sclerotic osseous foci along with enlarged retroperitoneal and hiatal lymph nodes consistent with malignancy.  Bone scan also showed abnormal radiotracer uptake involving calvarium spine ribs  right clavicle and pelvic girdle.   Patient is currently on ADT and was started on apalutamide  in December 2024. CT chest abdomen and pelvis with contrast in November 2025 which was done for follow-up of prostate cancer showed soft tissue mass about the left diaphragmatic crus consistent with recurrent disease.  Enlarged celiac axis and left retroperitoneal lymph nodes.  PET CT scan also showed areas of multiple liver metastases.  Liver biopsy was consistent with metastatic squamous cell carcinoma.  CEA elevated at 10   Tempus testing showed CPS score of 4.  High tumor mutational burden.PIK 3 CA mutation.  No other actionable mutation    Interval history-he is doing well presently.  Appetite and weight have remained stable.  Denies any abdominal pain.  Back pain is currently well-controlled with pain medications  ECOG PS- 1 Pain scale- 0 Opioid associated constipation- no  Review of systems- Review of Systems  Constitutional:  Positive for malaise/fatigue. Negative for chills, fever and weight loss.  HENT:  Negative for congestion, ear discharge and nosebleeds.   Eyes:  Negative for blurred vision.  Respiratory:  Negative for cough, hemoptysis, sputum production, shortness of breath and wheezing.   Cardiovascular:  Negative for chest pain, palpitations, orthopnea and claudication.  Gastrointestinal:  Negative for abdominal pain, blood in stool, constipation, diarrhea, heartburn, melena, nausea and vomiting.  Genitourinary:  Negative for dysuria, flank pain, frequency, hematuria and urgency.  Musculoskeletal:  Negative for back pain, joint pain and myalgias.  Skin:  Negative for rash.  Neurological:  Negative for dizziness, tingling, focal weakness, seizures, weakness and headaches.  Endo/Heme/Allergies:  Does not bruise/bleed easily.  Psychiatric/Behavioral:  Negative for depression and suicidal ideas. The patient does not have insomnia.  Allergies[1]   Past Medical History:   Diagnosis Date   Aortic atherosclerosis    Arthritis    Bladder tumor    Compression fracture of thoracic spine, non-traumatic (HCC) 01/28/2018   Coronary artery calcification    Dysphagia    Elevated liver enzymes    Esophageal cancer (HCC) 2018   Chemo + Rad tx's with surgical resection.    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 (HCC) 06/05/2017     Past Surgical History:  Procedure Laterality Date   CHOLECYSTECTOMY     COLONOSCOPY     COMPLETE ESOPHAGECTOMY N/A 06/18/2017   Procedure: cervical ESOPHAGECTOMY COMPLETE;  Surgeon: Army Dallas NOVAK, MD;  Location: [facility] OR;  Service: Thoracic;  Laterality: N/A;  NEED NIMS ET TUBE   ESOPHAGOGASTRODUODENOSCOPY (EGD) WITH PROPOFOL  N/A 12/07/2016   Procedure: ESOPHAGOGASTRODUODENOSCOPY (EGD) WITH PROPOFOL ;  Surgeon: Therisa Bi, MD;  Location: [facility] ENDOSCOPY;  Service: Gastroenterology;  Laterality: N/A;   ESOPHAGOGASTRODUODENOSCOPY (EGD) WITH PROPOFOL  N/A 12/26/2016   Procedure: ESOPHAGOGASTRODUODENOSCOPY (EGD) WITH PROPOFOL  WITH DILATION;  Surgeon: Therisa Bi, MD;  Location: [facility] ENDOSCOPY;  Service: Gastroenterology;  Laterality: N/A;   EYE SURGERY Bilateral    Cataracts   GASTROJEJUNOSTOMY N/A 01/16/2017   Procedure: LAPROSCOPIC ASSIST FEEDING JEJUNOSTOMY TUBE;  Surgeon: Gladis Cough, MD;  Location: [facility] ORS;  Service: General;  Laterality: N/A;   IR FLUORO GUIDE PORT INSERTION RIGHT  01/10/2017   IR IMAGING GUIDED PORT INSERTION  03/07/2024   IR REMOVAL TUN ACCESS W/ PORT W/O FL MOD SED  03/22/2018   IR REPLACE G-TUBE SIMPLE WO FLUORO  03/15/2017   IR REPLC DUODEN/JEJUNO TUBE PERCUT W/FLUORO  03/05/2017   KYPHOPLASTY N/A 08/31/2020   Procedure: L1 KYPHOPLASTY;  Surgeon: Kathlynn Sharper, MD;  Location: [facility] ORS;  Service: Orthopedics;  Laterality: N/A;   KYPHOPLASTY N/A 10/05/2020   Procedure: T12 KYPHOPLASTY;  Surgeon: Kathlynn Sharper, MD;  Location: [facility] ORS;  Service: Orthopedics;  Laterality: N/A;   PICC LINE  INSERTION Right    PROSTATE BIOPSY N/A 05/03/2021   Procedure: PROSTATE BIOPSY;  Surgeon: Twylla Glendia BROCKS, MD;  Location: [facility] ORS;  Service: Urology;  Laterality: N/A;   PYLOROMYOTOMY N/A 06/18/2017   Procedure: JUDITHE;  Surgeon: Army Dallas NOVAK, MD;  Location: [facility] OR;  Service: Thoracic;  Laterality: N/A;   TRANSRECTAL ULTRASOUND N/A 05/03/2021   Procedure: TRANSRECTAL ULTRASOUND;  Surgeon: Twylla Glendia BROCKS, MD;  Location: [facility] ORS;  Service: Urology;  Laterality: N/A;   TRANSURETHRAL RESECTION OF BLADDER TUMOR N/A 02/15/2021   Procedure: TRANSURETHRAL RESECTION OF BLADDER TUMOR (TURBT);  Surgeon: Twylla Glendia BROCKS, MD;  Location: [facility] ORS;  Service: Urology;  Laterality: N/A;   TRANSURETHRAL RESECTION OF BLADDER TUMOR WITH MITOMYCIN -C N/A 01/13/2020   Procedure: TRANSURETHRAL RESECTION OF BLADDER TUMOR WITH gemcitabine ;  Surgeon: Twylla Glendia BROCKS, MD;  Location: [facility] ORS;  Service: Urology;  Laterality: N/A;   TRANSURETHRAL RESECTION OF PROSTATE N/A 05/03/2021   Procedure: TRANSURETHRAL RESECTION OF THE PROSTATE (TURP);  Surgeon: Twylla Glendia BROCKS, MD;  Location: [facility] ORS;  Service: Urology;  Laterality: N/A;   VIDEO BRONCHOSCOPY N/A 06/18/2017   Procedure: VIDEO BRONCHOSCOPY;  Surgeon: Army Dallas NOVAK, MD;  Location: [facility] OR;  Service: Thoracic;  Laterality: N/A;    Social History   Socioeconomic History   Marital status: Married    Spouse name: Not on file   Number of children: Not on file   Years of education: Not on file   Highest education level: Not on file  Occupational History  Not on file  Tobacco Use   Smoking status: Never   Smokeless tobacco: Never  Vaping Use   Vaping status: Never Used  Substance and Sexual Activity   Alcohol use: No   Drug use: No   Sexual activity: Not Currently  Other Topics Concern   Not on file  Social History Narrative   Independent at baseline. Ambulatory   Social Drivers of Health   Tobacco Use: Low Risk (03/25/2024)    Patient History    Smoking Tobacco Use: Never    Smokeless Tobacco Use: Never    Passive Exposure: Not on file  Financial Resource Strain: Low Risk  (12/06/2023)   Received from [facility]   Overall Financial Resource Strain (CARDIA)    Difficulty of Paying Living Expenses: Not hard at all  Food Insecurity: No Food Insecurity (12/06/2023)   Received from [facility]   Epic    Within the past 12 months, you worried that your food would run out before you got the money to buy more.: Never true    Within the past 12 months, the food you bought just didn't last and you didn't have money to get more.: Never true  Transportation Needs: No Transportation Needs (12/06/2023)   Received from [facility] - Transportation    In the past 12 months, has lack of transportation kept you from medical appointments or from getting medications?: No    Lack of Transportation (Non-Medical): No  Physical Activity: Not on file  Stress: Not on file  Social Connections: Not on file  Intimate Partner Violence: Not on file  Depression (PHQ2-9): Low Risk (03/25/2024)   Depression (PHQ2-9)    PHQ-2 Score: 0  Alcohol Screen: Not on file  Housing: Low Risk  (12/06/2023)   Received from [facility]   Epic    In the last 12 months, was there a time when you were not able to pay the mortgage or rent on time?: No    In the past 12 months, how many times have you moved where you were living?: 0    At any time in the past 12 months, were you homeless or living in a shelter (including now)?: No  Utilities: Not At Risk (12/06/2023)   Received from [facility]   Epic    In the past 12 months has the electric, gas, oil, or water  company threatened to shut off services in your home?: No  Health Literacy: Adequate Health Literacy (04/13/2023)   B1300 Health Literacy    Frequency of need for help with medical instructions: Never     Family History  Problem Relation Age of Onset   Heart disease Father    Diabetes Mother    Hypertension Mother    Heart attack Brother 12   Prostate cancer Neg Hx    Bladder Cancer Neg Hx    Kidney cancer Neg Hx     Current Medications[2]  Physical exam:  Vitals:   03/25/24 0913  BP: 120/80  Pulse: 90  Resp: 19  Temp: (!) 96 F (35.6 C)  TempSrc: Tympanic  SpO2: 98%  Weight: 150 lb 3.2 oz (68.1 kg)  Height: 5' 8 (1.727 m)   Physical Exam Cardiovascular:     Rate and Rhythm: Normal rate and regular rhythm.     Heart sounds: Normal heart sounds.  Pulmonary:     Effort: Pulmonary effort is normal.  Breath sounds: Normal breath sounds.  Skin:    General: Skin is warm and dry.  Neurological:     Mental Status: He is alert and oriented to person, place, and time.      I have personally reviewed labs listed below:    Latest Ref Rng & Units 03/25/2024    8:52 AM  CMP  Glucose 70 - 99 mg/dL 890   BUN 8 - 23 mg/dL 16   Creatinine 9.38 - 1.24 mg/dL 8.78   Sodium 864 - 854 mmol/L 142   Potassium 3.5 - 5.1 mmol/L 3.6   Chloride 98 - 111 mmol/L 107   CO2 22 - 32 mmol/L 22   Calcium  8.9 - 10.3 mg/dL 9.2   Total Protein 6.5 - 8.1 g/dL 6.8   Total Bilirubin 0.0 - 1.2 mg/dL 0.3   Alkaline Phos 38 - 126 U/L 127   AST 15 - 41 U/L 12   ALT 0 - 44 U/L <5       Latest Ref Rng & Units 03/25/2024    8:52 AM  CBC  WBC 4.0 - 10.5 K/uL 6.8   Hemoglobin 13.0 - 17.0 g/dL 86.7   Hematocrit 60.9 - 52.0 % 39.3   Platelets 150 - 400 K/uL 212    I have personally reviewed Radiology images listed below:   IR IMAGING GUIDED PORT INSERTION Result Date: 03/07/2024 EXAM: Implanted venous access port placement COMPARISON: 01/10/2017 by report only CLINICAL HISTORY: Indication / Diagnosis: History of esophageal carcinoma, with multiple liver lesions, durable venous access needed for planned treatment regimen. Additional clinical history: Poor IV access. Complications: No  immediate complications. Conscious Sedation: 1 mg Versed  IV, 50 mcg fentanyl  IV. Sedation Technique: Under physician supervision, Versed  and fentanyl  were administered IV for moderate sedation. Pulse oximetry, heart rate, and blood pressure were continuously monitored with an independent trained observer present. Physician face-to-face sedation time: 15 minutes. Discussion: Time out performed including verification of patient, date of birth, procedure, and access site as appropriate. Informed written consent was obtained. The access site and chest wall were prepared and draped using maximal sterile barrier technique including cutaneous antisepsis. The patient was positioned supine. Initial imaging was performed. Venous access: Access site: Right external jugular vein. On imaging, the right internal jugular vein was occluded, presumably related to a previously removed right internal jugular port catheter. Laterality: Right. Local anesthesia was administered. Under ultrasound guidance, venous access was obtained. A guidewire was advanced and a subcutaneous pocket was created. An implanted venous access port was placed with the catheter tunneled to the venous entry site. Port details: Port type: Single lumen. Catheter tip position confirmed with imaging, in the region of the cavoatrial junction. The port was accessed and aspirated easily and flushed with saline and heparinized saline. The pocket and access site were closed and a sterile dressing was applied. Post-procedure imaging: Immediate post-placement imaging: Fluoroscopy. Post-procedure imaging findings: Stable port. No pneumothorax. Radiation exposure index (as provided by the fluoroscopic device): 3mg y air kerma Contrast: None. Estimated blood loss: Less than 10 mL. IMPRESSION: 1. Technically successful placement of a right external jugular vein implanted venous access port. 2. Right internal jugular vein occlusion, presumably related to previously removed  right internal jugular port catheter. 3. No immediate complications. Electronically signed by: Katheleen Faes MD 03/07/2024 03:15 PM EST RP Workstation: [id number]   US  BIOPSY (LIVER) Result Date: 02/26/2024 INDICATION: History of squamous esophageal carcinoma and prostate carcinoma. Multiple liver lesions concerning for metastatic disease. The patient presents  for liver lesion biopsy. EXAM: ULTRASOUND GUIDED CORE BIOPSY OF LIVER MEDICATIONS: None. ANESTHESIA/SEDATION: Moderate (conscious) sedation was employed during this procedure. A total of Versed  1.0 mg and Fentanyl  50 mcg was administered intravenously. Moderate Sedation Time: 16 minutes. The patient's level of consciousness and vital signs were monitored continuously by radiology nursing throughout the procedure under my direct supervision. PROCEDURE: The procedure, risks, benefits, and alternatives were explained to the patient. Questions regarding the procedure were encouraged and answered. The patient understands and consents to the procedure. A time out was performed prior to initiating the procedure. The abdominal wall was prepped with chlorhexidine  in a sterile fashion, and a sterile drape was applied covering the operative field. A sterile gown and sterile gloves were used for the procedure. Local anesthesia was provided with 1% Lidocaine . Ultrasound was used to localize liver lesions. A lesion was chosen for sampling. Under ultrasound guidance, a 17 gauge trocar needle was advanced to the edge of the liver lesion. Three separate coaxial 18 gauge core biopsy samples were obtained and submitted in formalin. A slurry of Gel-Foam EmboCubes mixed in sterile saline was then injected through the outer needle as the needle was retracted and removed. Additional ultrasound was performed. COMPLICATIONS: None immediate. FINDINGS: Relatively hyperechoic lesions are identified in the liver. The most visible and accessible lesion is a lesion in the right lobe  measuring approximately 2.1 x 2.0 x 1.6 cm. This lesion was sampled with solid tissue obtained. IMPRESSION: Ultrasound-guided core biopsy performed of a lesion in the right lobe of the liver measuring up to 2.1 cm in maximum diameter by ultrasound. Electronically Signed   By: Marcey Moan M.D.   On: 02/26/2024 10:56     Assessment and plan- Patient is a 84 y.o. 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 Pending additional tumor testing.  Tempus testing shows CPS score of 4.  Nivolumab to be added next cycle.  Patient will receive first-line FOLFOX nivolumab treatment every 2 weeks until progression or toxicity. - Aimed to balance efficacy and quality of life. Counseled on chemotherapy-induced neuropathy. Response to be assessed with imaging and CEA monitoring. - Administered first cycle of FOLFOX chemotherapy. - Educated on home infusion pump management and port care. - Scheduled return visit in two days for pump removal. - Add nivolumab to regimen with next cycle. - Repeat imaging after approximately six cycles to assess response. - Instructed to report neuropathy or illness promptly. - Oncology team designated as primary contact for urgent issues.  Prostate cancer Continues on oral therapy and androgen deprivation therapy injections. Management to continue as established. - Continue current oral therapy with Erleada .  He would be due for his next dose of ADT around 05/02/2024   Neoplasm related pain: Continue as needed oxycodone    Visit Diagnosis 1. Stage IV malignant neoplasm of esophagus (HCC)   2. Encounter for antineoplastic chemotherapy   3. Neoplasm related pain      Dr. Annah Skene,  MD, MPH [facility] [phone] 03/25/2024 12:27 PM                   [1]  Allergies Allergen Reactions   Tramadol  Other (See Comments)    Hallucination Pt states  that it made crazy.  [2]  Current Outpatient Medications:    Acetaminophen  500 MG capsule, Take by mouth., Disp: , Rfl:    aspirin 325 MG tablet, Take 325 mg by mouth daily as needed for mild pain (pain score 1-3)., Disp: , Rfl:    Calcium  Carb-Cholecalciferol 500-10 MG-MCG TABS, Take 1 tablet by mouth daily., Disp: , Rfl:    dexamethasone  (DECADRON ) 4 MG tablet, Take 2 tablets (8 mg total) by mouth daily. Start the day after chemotherapy for 2 days. Take with food., Disp: 30 tablet, Rfl: 1   ERLEADA  60 MG tablet, TAKE 3 TABLETS BY MOUTH EVERY DAY, Disp: 120 tablet, Rfl: 2   lidocaine -prilocaine  (EMLA ) cream, Apply to affected area once, Disp: 30 g, Rfl: 3   Multiple Vitamin (MULTIVITAMIN) tablet, Take 1 tablet by mouth daily., Disp: , Rfl:    ondansetron  (ZOFRAN ) 8 MG tablet, Take 1 tablet (8 mg total) by mouth every 8 (eight) hours as needed for nausea or vomiting. Start on the third day after chemotherapy., Disp: 30 tablet, Rfl: 1   oxyCODONE  (OXY IR/ROXICODONE ) 5 MG immediate release tablet, Take 1 tablet (5 mg total) by mouth every 4 (four) hours as needed for severe pain (pain score 7-10)., Disp: 120 tablet, Rfl: 0   prochlorperazine  (COMPAZINE ) 10 MG tablet, Take 1 tablet (10 mg total) by mouth every 6 (six) hours as needed for nausea or vomiting., Disp: 30 tablet, Rfl: 2   ondansetron  (ZOFRAN ) 8 MG tablet, Take 1 tablet (8 mg total) by mouth every 8 (eight) hours as needed for nausea or vomiting., Disp: 60 tablet, Rfl: 2 No current facility-administered medications for this visit.  Facility-Administered Medications Ordered in Other Visits:    dextrose  5 % solution, , Intravenous, Continuous, Melanee Annah BROCKS, MD, Last Rate: 10 mL/hr at 03/25/24 0941, New Bag at 03/25/24 0941    fluorouracil  (ADRUCIL ) 4,350 mg in sodium chloride  0.9 % 63 mL chemo infusion, 2,400 mg/m2 (Treatment Plan Recorded), Intravenous, 1 day or 1 dose, Melanee Annah BROCKS, MD   fluorouracil  (ADRUCIL ) chemo injection 750 mg, 400 mg/m2 (Treatment Plan Recorded), Intravenous, Once, Melanee Annah BROCKS, MD  "

## 2024-03-25 NOTE — Telephone Encounter (Signed)
 Oral Oncology Patient Advocate Encounter  Patient prefers to stay with PAP.  Bernita Beckstrom ([name]) [name], [name]  [facility], [name], [facility] - Oral Chemotherapy Patient Advocate Specialist III Phone: [phone]  Fax: [phone]

## 2024-03-25 NOTE — Telephone Encounter (Signed)
 Oral Oncology Patient Advocate Encounter  Patient prefers to stay with PAP.  Casey Acevedo ([name]) [name], [name]  [facility], [name], [facility] - Oral Chemotherapy Patient Advocate Specialist III Phone: [phone]  Fax: [phone]

## 2024-03-25 NOTE — Telephone Encounter (Signed)
 Oral Oncology Patient Advocate Encounter   Received notification that the application for assistance for erleada  through [employer] has been approved.   [employer] phone number [phone].   Effective dates: 03/13/2024 through 03/12/2025  Medication will be filled at [facility].  Casey Acevedo ([name]) [name], [name]  [facility], [name], [facility] - Oral Chemotherapy Patient Advocate Specialist III Phone: [phone]  Fax: [phone]

## 2024-03-27 ENCOUNTER — Inpatient Hospital Stay

## 2024-03-28 ENCOUNTER — Inpatient Hospital Stay

## 2024-03-28 ENCOUNTER — Ambulatory Visit
Admission: RE | Admit: 2024-03-28 | Discharge: 2024-03-28 | Disposition: A | Discharge status: Home / Self Care | Source: Ambulatory Visit | Attending: Nurse Practitioner | Admitting: Nurse Practitioner

## 2024-03-28 ENCOUNTER — Telehealth: Payer: Self-pay

## 2024-03-28 ENCOUNTER — Ambulatory Visit
Admission: RE | Admit: 2024-03-28 | Discharge: 2024-03-28 | Disposition: A | Discharge status: Home / Self Care | Attending: Nurse Practitioner | Admitting: Nurse Practitioner

## 2024-03-28 ENCOUNTER — Other Ambulatory Visit: Payer: Self-pay

## 2024-03-28 ENCOUNTER — Encounter: Payer: Self-pay | Admitting: Nurse Practitioner

## 2024-03-28 ENCOUNTER — Inpatient Hospital Stay: Admitting: Nurse Practitioner

## 2024-03-28 VITALS — BP 137/80 | HR 82 | Temp 97.0°F | Resp 16 | Wt 146.2 lb

## 2024-03-28 DIAGNOSIS — R051 Acute cough: Secondary | ICD-10-CM | POA: Insufficient documentation

## 2024-03-28 DIAGNOSIS — R112 Nausea with vomiting, unspecified: Secondary | ICD-10-CM

## 2024-03-28 DIAGNOSIS — C159 Malignant neoplasm of esophagus, unspecified: Secondary | ICD-10-CM | POA: Diagnosis not present

## 2024-03-28 DIAGNOSIS — B974 Respiratory syncytial virus as the cause of diseases classified elsewhere: Secondary | ICD-10-CM

## 2024-03-28 DIAGNOSIS — J00 Acute nasopharyngitis [common cold]: Secondary | ICD-10-CM | POA: Diagnosis not present

## 2024-03-28 DIAGNOSIS — Z5112 Encounter for antineoplastic immunotherapy: Secondary | ICD-10-CM | POA: Diagnosis not present

## 2024-03-28 LAB — RESP PANEL BY RT-PCR (RSV, FLU A&B, COVID)  RVPGX2
Influenza A by PCR: NEGATIVE
Influenza B by PCR: NEGATIVE
Resp Syncytial Virus by PCR: POSITIVE — AB
SARS Coronavirus 2 by RT PCR: NEGATIVE

## 2024-03-28 LAB — COMPREHENSIVE METABOLIC PANEL WITH GFR
ALT: 10 U/L (ref 0–44)
AST: 14 U/L — ABNORMAL LOW (ref 15–41)
Albumin: 4 g/dL (ref 3.5–5.0)
Alkaline Phosphatase: 113 U/L (ref 38–126)
Anion gap: 12 (ref 5–15)
BUN: 16 mg/dL (ref 8–23)
CO2: 25 mmol/L (ref 22–32)
Calcium: 9 mg/dL (ref 8.9–10.3)
Chloride: 99 mmol/L (ref 98–111)
Creatinine, Ser: 1.01 mg/dL (ref 0.61–1.24)
GFR, Estimated: >60 mL/min
Glucose, Bld: 117 mg/dL — ABNORMAL HIGH (ref 70–99)
Potassium: 4.1 mmol/L (ref 3.5–5.1)
Sodium: 136 mmol/L (ref 135–145)
Total Bilirubin: 0.5 mg/dL (ref 0.0–1.2)
Total Protein: 6.8 g/dL (ref 6.5–8.1)

## 2024-03-28 LAB — CBC WITH DIFFERENTIAL/PLATELET
Abs Immature Granulocytes: 0.04 K/uL (ref 0.00–0.07)
Basophils Absolute: 0.1 K/uL (ref 0.0–0.1)
Basophils Relative: 1 %
Eosinophils Absolute: 0 K/uL (ref 0.0–0.5)
Eosinophils Relative: 1 %
HCT: 38.9 % — ABNORMAL LOW (ref 39.0–52.0)
Hemoglobin: 13.1 g/dL (ref 13.0–17.0)
Immature Granulocytes: 1 %
Lymphocytes Relative: 9 %
Lymphs Abs: 0.8 K/uL (ref 0.7–4.0)
MCH: 30.5 pg (ref 26.0–34.0)
MCHC: 33.7 g/dL (ref 30.0–36.0)
MCV: 90.5 fL (ref 80.0–100.0)
Monocytes Absolute: 0.1 K/uL (ref 0.1–1.0)
Monocytes Relative: 2 %
Neutro Abs: 7.4 K/uL (ref 1.7–7.7)
Neutrophils Relative %: 86 %
Platelets: 227 K/uL (ref 150–400)
RBC: 4.3 MIL/uL (ref 4.22–5.81)
RDW: 12.7 % (ref 11.5–15.5)
WBC: 8.4 K/uL (ref 4.0–10.5)
nRBC: 0 % (ref 0.0–0.2)

## 2024-03-28 LAB — MAGNESIUM: Magnesium: 2 mg/dL (ref 1.7–2.4)

## 2024-03-28 MED ORDER — PROCHLORPERAZINE MALEATE 10 MG PO TABS: 10.0000 mg | ORAL_TABLET | Freq: Four times a day (QID) | ORAL | 2 refills | Status: AC | PRN

## 2024-03-28 MED ORDER — HYDROCODONE BIT-HOMATROP MBR 5-1.5 MG/5ML PO SOLN: 5.0000 mL | Freq: Three times a day (TID) | ORAL | 0 refills | Status: AC | PRN

## 2024-03-28 MED ORDER — SODIUM CHLORIDE 0.9 % IV SOLN
INTRAVENOUS | Status: DC
  Filled 2024-03-28 (×2): qty 250

## 2024-03-28 MED ORDER — ONDANSETRON HCL 4 MG/2ML IJ SOLN
8.0000 mg | Freq: Once | INTRAMUSCULAR | Status: AC
  Administered 2024-03-28: 8 mg via INTRAVENOUS
  Filled 2024-03-28: qty 4

## 2024-03-28 NOTE — Telephone Encounter (Signed)
 Patient having nausea/vomiting not able to eat or drink due to nausea that does not improve with antiemetics.  Also reports stomach pain and headache that he believes is due to dehydration.    Would like to come for [facility] today any time.

## 2024-03-28 NOTE — Progress Notes (Signed)
 Pt transferred to clinic to see NP Tinnie Dawn to discuss test results.

## 2024-03-28 NOTE — Progress Notes (Signed)
 "  Symptom Management Clinic  [facility] at [facility] A Department of the [place]. [facility] [address] [place], [place] [address] [phone] (phone) [phone] (fax)  Patient Care Team: Auston Reyes BIRCH, MD as PCP - General (Internal Medicine) End, Lonni, MD as PCP - Cardiology (Cardiology) Army Dallas NOVAK, MD (Inactive) as Consulting Physician (Cardiothoracic Surgery) Melanee Annah BROCKS, MD as Consulting Physician (Oncology) Lenn Aran, MD as Referring Physician (Radiation Oncology)   Name of the patient: Casey Acevedo  [id number]  Aug 04, 1940   Date of visit: 03/28/24  Diagnosis- Esophageal Cancer  Chief complaint/ Reason for visit- Congestion, Cough, Malaise  Heme/Onc history:  Oncology History  Malignant neoplasm of lower third of esophagus (HCC) (Resolved)  01/04/2017 Initial Diagnosis   Malignant neoplasm of lower third of esophagus (HCC)   06/22/2017 Cancer Staging   Staging form: Esophagus - Adenocarcinoma, AJCC 8th Edition - Pathologic stage from 06/22/2017: Stage Unknown (ypTX, pN1, cM0, GX) - Signed by Army [place] NOVAK, MD on 06/22/2017   Prostate cancer (HCC)  05/30/2021 Initial Diagnosis   Prostate cancer (HCC)   02/07/2023 Cancer Staging   Staging form: Prostate, AJCC 8th Edition - Clinical stage from 02/07/2023: Stage IVB (cT3, cN1, cM1b, PSA: 10, Grade Group: 5) - Signed by Melanee Annah BROCKS, MD on 02/12/2023 Histopathologic type: Adenocarcinoma, NOS Prostate specific antigen (PSA) range: 10 to 19 Histologic grading system: 5 grade system   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 male with above history of esophageal cancer, currently s/p cycle 1 of FOLFOX  chemotherapy on 03/25/24, and presents to symptom management clinic for complaints of congestion, cough, and malaise. Symptoms started yesterday and have worsened today. He has chest and sinus congestion. Teeth hurt. Has had nausea and vomiting and is unable to keep down food. Weight is down.   Review of systems- Review of Systems  Constitutional:  Positive for malaise/fatigue and weight loss. Negative for chills and fever.  HENT:  Positive for congestion and sinus pain. Negative for hearing loss, nosebleeds, sore throat and tinnitus.   Eyes:  Negative for blurred vision and double vision.  Respiratory:  Positive for cough and sputum production. Negative for hemoptysis, shortness of breath and wheezing.   Cardiovascular:  Negative for chest pain, palpitations and leg swelling.  Gastrointestinal:  Positive for nausea and vomiting. Negative for abdominal pain, blood in stool, constipation, diarrhea and melena.  Genitourinary:  Negative for dysuria and urgency.  Musculoskeletal:  Negative for back pain, falls, joint pain and myalgias.  Skin:  Negative for itching and rash.  Neurological:  Positive for dizziness. Negative for tingling, sensory change, loss of consciousness, weakness and headaches.  Endo/Heme/Allergies:  Negative for environmental allergies. Does not bruise/bleed easily.  Psychiatric/Behavioral:  Negative for depression. The patient is not nervous/anxious and does not have insomnia.      Allergies[1]  Past Medical History:  Diagnosis Date   Aortic atherosclerosis    Arthritis    Bladder tumor    Compression fracture of thoracic spine, non-traumatic (HCC) 01/28/2018   Coronary artery calcification    Dysphagia    Elevated liver enzymes    Esophageal cancer (HCC) 2018   Chemo + Rad tx's  with surgical resection.    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 (HCC) 06/05/2017    Past Surgical History:  Procedure Laterality Date   CHOLECYSTECTOMY     COLONOSCOPY      COMPLETE ESOPHAGECTOMY N/A 06/18/2017   Procedure: cervical ESOPHAGECTOMY COMPLETE;  Surgeon: Army Dallas NOVAK, MD;  Location: [facility] OR;  Service: Thoracic;  Laterality: N/A;  NEED NIMS ET TUBE   ESOPHAGOGASTRODUODENOSCOPY (EGD) WITH PROPOFOL  N/A 12/07/2016   Procedure: ESOPHAGOGASTRODUODENOSCOPY (EGD) WITH PROPOFOL ;  Surgeon: Therisa Bi, MD;  Location: [facility] ENDOSCOPY;  Service: Gastroenterology;  Laterality: N/A;   ESOPHAGOGASTRODUODENOSCOPY (EGD) WITH PROPOFOL  N/A 12/26/2016   Procedure: ESOPHAGOGASTRODUODENOSCOPY (EGD) WITH PROPOFOL  WITH DILATION;  Surgeon: Therisa Bi, MD;  Location: [facility] ENDOSCOPY;  Service: Gastroenterology;  Laterality: N/A;   EYE SURGERY Bilateral    Cataracts   GASTROJEJUNOSTOMY N/A 01/16/2017   Procedure: LAPROSCOPIC ASSIST FEEDING JEJUNOSTOMY TUBE;  Surgeon: Gladis Cough, MD;  Location: [facility] ORS;  Service: General;  Laterality: N/A;   IR FLUORO GUIDE PORT INSERTION RIGHT  01/10/2017   IR IMAGING GUIDED PORT INSERTION  03/07/2024   IR REMOVAL TUN ACCESS W/ PORT W/O FL MOD SED  03/22/2018   IR REPLACE G-TUBE SIMPLE WO FLUORO  03/15/2017   IR REPLC DUODEN/JEJUNO TUBE PERCUT W/FLUORO  03/05/2017   KYPHOPLASTY N/A 08/31/2020   Procedure: L1 KYPHOPLASTY;  Surgeon: Kathlynn Sharper, MD;  Location: [facility] ORS;  Service: Orthopedics;  Laterality: N/A;   KYPHOPLASTY N/A 10/05/2020   Procedure: T12 KYPHOPLASTY;  Surgeon: Kathlynn Sharper, MD;  Location: [facility] ORS;  Service: Orthopedics;  Laterality: N/A;   PICC LINE INSERTION Right    PROSTATE BIOPSY N/A 05/03/2021   Procedure: PROSTATE BIOPSY;  Surgeon: Twylla Glendia BROCKS, MD;  Location: [facility] ORS;  Service: Urology;  Laterality: N/A;   PYLOROMYOTOMY N/A 06/18/2017   Procedure: JUDITHE;  Surgeon: Army Dallas NOVAK, MD;  Location: [facility] OR;  Service: Thoracic;  Laterality: N/A;   TRANSRECTAL ULTRASOUND N/A 05/03/2021   Procedure: TRANSRECTAL ULTRASOUND;  Surgeon: Twylla Glendia BROCKS, MD;  Location: [facility] ORS;  Service: Urology;   Laterality: N/A;   TRANSURETHRAL RESECTION OF BLADDER TUMOR N/A 02/15/2021   Procedure: TRANSURETHRAL RESECTION OF BLADDER TUMOR (TURBT);  Surgeon: Twylla Glendia BROCKS, MD;  Location: [facility] ORS;  Service: Urology;  Laterality: N/A;   TRANSURETHRAL RESECTION OF BLADDER TUMOR WITH MITOMYCIN -C N/A 01/13/2020   Procedure: TRANSURETHRAL RESECTION OF BLADDER TUMOR WITH gemcitabine ;  Surgeon: Twylla Glendia BROCKS, MD;  Location: [facility] ORS;  Service: Urology;  Laterality: N/A;   TRANSURETHRAL RESECTION OF PROSTATE N/A 05/03/2021   Procedure: TRANSURETHRAL RESECTION OF THE PROSTATE (TURP);  Surgeon: Twylla Glendia BROCKS, MD;  Location: [facility] ORS;  Service: Urology;  Laterality: N/A;   VIDEO BRONCHOSCOPY N/A 06/18/2017   Procedure: VIDEO BRONCHOSCOPY;  Surgeon: Army Dallas NOVAK, MD;  Location: [facility] OR;  Service: Thoracic;  Laterality: N/A;    Social History   Socioeconomic History   Marital status: Married    Spouse name: Not on file   Number of children: Not on file   Years of education: Not on file   Highest education level: Not on file  Occupational History   Not on file  Tobacco Use   Smoking status: Never   Smokeless tobacco: Never  Vaping Use   Vaping status: Never Used  Substance and Sexual Activity   Alcohol use: No   Drug use: No   Sexual activity: Not Currently  Other Topics Concern   Not on file  Social History Narrative  Independent at baseline. Ambulatory   Social Drivers of Health   Tobacco Use: Low Risk (03/28/2024)   Patient History    Smoking Tobacco Use: Never    Smokeless Tobacco Use: Never    Passive Exposure: Not on file  Financial Resource Strain: Low Risk  (12/06/2023)   Received from [facility]   Overall Financial Resource Strain (CARDIA)    Difficulty of Paying Living Expenses: Not hard at all  Food Insecurity: No Food Insecurity (12/06/2023)   Received from [facility]   Epic    Within the past 12 months, you worried that your food  would run out before you got the money to buy more.: Never true    Within the past 12 months, the food you bought just didn't last and you didn't have money to get more.: Never true  Transportation Needs: No Transportation Needs (12/06/2023)   Received from [facility] - Transportation    In the past 12 months, has lack of transportation kept you from medical appointments or from getting medications?: No    Lack of Transportation (Non-Medical): No  Physical Activity: Not on file  Stress: Not on file  Social Connections: Not on file  Intimate Partner Violence: Not on file  Depression (PHQ2-9): Low Risk (03/28/2024)   Depression (PHQ2-9)    PHQ-2 Score: 0  Alcohol Screen: Not on file  Housing: Low Risk  (12/06/2023)   Received from [facility]   Epic    In the last 12 months, was there a time when you were not able to pay the mortgage or rent on time?: No    In the past 12 months, how many times have you moved where you were living?: 0    At any time in the past 12 months, were you homeless or living in a shelter (including now)?: No  Utilities: Not At Risk (12/06/2023)   Received from [facility]   Epic    In the past 12 months has the electric, gas, oil, or water  company threatened to shut off services in your home?: No  Health Literacy: Adequate Health Literacy (04/13/2023)   B1300 Health Literacy    Frequency of need for help with medical instructions: Never    Family History  Problem Relation Age of Onset   Heart disease Father    Diabetes Mother    Hypertension Mother    Heart attack Brother 12   Prostate cancer Neg Hx    Bladder Cancer Neg Hx    Kidney cancer Neg Hx     Current Medications[2]  Physical exam:  Vitals:   03/28/24 1125 03/28/24 1130  BP: (!) 156/77 137/80  Pulse: 73 82  Resp: 16   Temp: (!) 97 F (36.1 C)   TempSrc: Tympanic   SpO2: 96%   Weight: 146 lb 3.2 oz (66.3 kg)    Physical  Exam Vitals reviewed.  Constitutional:      General: He is not in acute distress.    Appearance: He is well-developed. He is ill-appearing.     Comments: Accompanied by his wife  HENT:     Head: Normocephalic and atraumatic.     Nose: Nose normal.     Mouth/Throat:     Pharynx: No oropharyngeal exudate.  Eyes:     General: No scleral icterus.    Conjunctiva/sclera: Conjunctivae normal.     Pupils: Pupils are equal, round, and reactive to  light.  Cardiovascular:     Rate and Rhythm: Normal rate and regular rhythm.     Heart sounds: Normal heart sounds.  Pulmonary:     Effort: Pulmonary effort is normal.     Breath sounds: No stridor. No wheezing or rhonchi.     Comments: Diminished bilateral Abdominal:     General: There is no distension.     Palpations: Abdomen is soft.     Tenderness: There is no abdominal tenderness.  Musculoskeletal:     Cervical back: Normal range of motion and neck supple.     Right lower leg: No edema.     Left lower leg: No edema.  Skin:    General: Skin is warm and dry.     Coloration: Skin is not pale.  Neurological:     Mental Status: He is alert and oriented to person, place, and time.  Psychiatric:        Mood and Affect: Mood normal.        Behavior: Behavior normal.         Latest Ref Rng & Units 03/28/2024   10:56 AM  CMP  Glucose 70 - 99 mg/dL 882   BUN 8 - 23 mg/dL 16   Creatinine 9.38 - 1.24 mg/dL 8.98   Sodium 864 - 854 mmol/L 136   Potassium 3.5 - 5.1 mmol/L 4.1   Chloride 98 - 111 mmol/L 99   CO2 22 - 32 mmol/L 25   Calcium  8.9 - 10.3 mg/dL 9.0   Total Protein 6.5 - 8.1 g/dL 6.8   Total Bilirubin 0.0 - 1.2 mg/dL 0.5   Alkaline Phos 38 - 126 U/L 113   AST 15 - 41 U/L 14   ALT 0 - 44 U/L 10       Latest Ref Rng & Units 03/28/2024   10:56 AM  CBC  WBC 4.0 - 10.5 K/uL 8.4   Hemoglobin 13.0 - 17.0 g/dL 86.8   Hematocrit 60.9 - 52.0 % 38.9   Platelets 150 - 400 K/uL 227    Resp Panel by RT-PCR SARS Coronavirus 2 by RT  PCR NEGATIVE   Influenza A by PCR NEGATIVE  Influenza B by PCR NEGATIVE   Resp Syncytial Virus by PCR POSITIVE Abnormal     DG Chest 2 View Result Date: 03/28/2024 EXAM: 2 VIEW(S) XRAY OF THE CHEST 03/28/2024 03:08:00 PM COMPARISON: None available. CLINICAL HISTORY: acute cough- on chemotherapy FINDINGS: LINES, TUBES AND DEVICES: Right Port-A-Cath in place with tip in mid SVC. LUNGS AND PLEURA: Large hiatal hernia. No focal pulmonary opacity. No pleural effusion. No pneumothorax. HEART AND MEDIASTINUM: Ectatic and calcified aorta. No acute abnormality of the cardiac silhouette. BONES AND SOFT TISSUES: Old left rib fractures. Thoracic degenerative changes. Kyphoplasty at thoracolumbar junction. No acute osseous abnormality. IMPRESSION: 1. No acute cardiopulmonary abnormality. 2. Large hiatal hernia. 3. Right chest Port-A-Cath with tip in the mid SVC. Electronically signed by: Greig Pique MD 03/28/2024 03:17 PM EST RP Workstation: [id number]    Assessment and plan- Patient is a 84 y.o. 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 Melanee on 03/25/2024, who presents to symptom management clinic for evaluation of:  RSV-we reviewed the management focuses on supportive care.  Not routinely recommended antiviral therapy for RSV in adults.  Currently no evidence of hypoxia and I have encouraged him to monitor his breathing at home.  He may need hospital evaluation if respiratory failure develops.  Chest x-ray today does not show any evidence of pneumonia.  Labs today were reassuring.  No evidence of electrolyte derangements.  Vital signs are stable.  Oxygen saturations normal.  Advise no role for antibiotics given viral infection.  IV fluids today.  Zofran  8 mg IV for nausea and vomiting.  I reviewed the use of over-the-counter cough syrup and I will also send him a prescription for  Hycodan.  Narcotic precautions reviewed.  I have refilled his Compazine  prescription and does other antiemetics at home.  We reviewed that RSV is highly transmissible.  If symptoms have not improved or worsening then I will want to see him back in clinic first of next week.  Also advised of physician on-call.   Visit Diagnosis 1. Acute nasopharyngitis due to respiratory syncytial virus (RSV)   2. Nausea and vomiting, unspecified vomiting type   3. Acute cough   4. Stage IV malignant neoplasm of esophagus [facility])     Patient expressed understanding and was in agreement with this plan. He also understands that He can call clinic at any time with any questions, concerns, or complaints.   Thank you for allowing me to participate in the care of this very pleasant patient.   Tinnie Dawn, DNP, AGNP-C, AOCNP [facility] [phone]      [1]  Allergies Allergen Reactions   Tramadol  Other (See Comments)    Hallucination Pt states that it made crazy.  [2]  Current Outpatient Medications:    Acetaminophen  500 MG capsule, Take by mouth., Disp: , Rfl:    aspirin 325 MG tablet, Take 325 mg by mouth daily as needed for mild pain (pain score 1-3)., Disp: , Rfl:    Calcium  Carb-Cholecalciferol 500-10 MG-MCG TABS, Take 1 tablet by mouth daily., Disp: , Rfl:    dexamethasone  (DECADRON ) 4 MG tablet, Take 2 tablets (8 mg total) by mouth daily. Start the day after chemotherapy for 2 days. Take with food., Disp: 30 tablet, Rfl: 1   ERLEADA  60 MG tablet, TAKE 3 TABLETS BY MOUTH EVERY DAY, Disp: 120 tablet, Rfl: 2   HYDROcodone  bit-homatropine (HYCODAN) 5-1.5 MG/5ML syrup, Take 5 mLs by mouth every 8 (eight) hours as needed for cough. May cause drowsiness, Disp: 120 mL, Rfl: 0   lidocaine -prilocaine  (EMLA ) cream, Apply to affected area once, Disp: 30 g, Rfl: 3   Multiple Vitamin (MULTIVITAMIN) tablet, Take 1 tablet by mouth daily., Disp: , Rfl:    ondansetron  (ZOFRAN ) 8 MG tablet,  Take 1 tablet (8 mg total) by mouth every 8 (eight) hours as needed for nausea or vomiting., Disp: 60 tablet, Rfl: 2   ondansetron  (ZOFRAN ) 8 MG tablet, Take 1 tablet (8 mg total) by mouth every 8 (eight) hours as needed for nausea or vomiting. Start on the third day after chemotherapy., Disp: 30 tablet, Rfl: 1   oxyCODONE  (OXY IR/ROXICODONE ) 5 MG immediate release tablet, Take 1 tablet (5 mg total) by mouth every 4 (four) hours as needed for severe pain (pain score 7-10)., Disp: 120 tablet, Rfl: 0   prochlorperazine  (COMPAZINE ) 10 MG tablet, Take 1 tablet (10 mg total) by mouth every 6 (six) hours as needed for nausea or vomiting., Disp: 90 tablet, Rfl: 2  "

## 2024-03-28 NOTE — Patient Instructions (Signed)
 You have been diagnosed with RSV. I have called in a prescription for a narcotic cough syrup called Hycodan. You can also take over the counter cough medications such as Robitussin DM/Dextromethorphan & guaifenesin . I've re-sent prescriptions for compazine . You received zofran  in clinic for nausea through your IV. It was a pleasure meeting you today and thank you for allowing me to participate in your care. -Tinnie Dawn, NP

## 2024-03-28 NOTE — Addendum Note (Signed)
 Addended by: BETHENE ALVIN RAMAN on: 03/28/2024 02:13 PM   Modules accepted: Orders

## 2024-03-31 ENCOUNTER — Encounter: Payer: Self-pay | Admitting: Oncology

## 2024-04-02 ENCOUNTER — Telehealth: Payer: Self-pay | Admitting: Pharmacy Technician

## 2024-04-02 ENCOUNTER — Encounter: Payer: Self-pay | Admitting: Oncology

## 2024-04-02 ENCOUNTER — Other Ambulatory Visit (HOSPITAL_COMMUNITY): Payer: Self-pay

## 2024-04-02 NOTE — Telephone Encounter (Signed)
 Oral Oncology Patient Advocate Encounter  Received staff message stating medication requires PA  After completing a benefits investigation, prior authorization for prochlorperazine  (COMPAZINE ) 10 MG tablet is not required at this time through [facility] part D.  Casey Acevedo (Casey) Chet Acevedo, [name]  [facility], [name], [facility] - Oral Chemotherapy Patient Advocate Specialist III Phone: [phone]  Fax: [phone]

## 2024-04-03 ENCOUNTER — Other Ambulatory Visit: Payer: Self-pay | Admitting: *Deleted

## 2024-04-03 DIAGNOSIS — C159 Malignant neoplasm of esophagus, unspecified: Secondary | ICD-10-CM

## 2024-04-03 DIAGNOSIS — R197 Diarrhea, unspecified: Secondary | ICD-10-CM

## 2024-04-03 MED ORDER — DIPHENOXYLATE-ATROPINE 2.5-0.025 MG PO TABS: 1.0000 | ORAL_TABLET | Freq: Four times a day (QID) | ORAL | 1 refills | Status: AC | PRN

## 2024-04-03 NOTE — Telephone Encounter (Signed)
 Caller verified using pt's full name and dob prior to discussing PHI    Patient called triage to request RF of Lomotil -[facility]. Last RF in 2024.       NURSING SYMPTOM TRIAGE ASSESSMENT & DISPOSITION  Mode of interaction:  Telephone Date of symptom triage interaction:  04/03/24 Time of symptom triage interaction:  [phone]  Cancer diagnosis:  No diagnosis found. Current anti-cancer treatment:  Patient is on Treatment Plan :  GASTROESOPHAGEAL FOLFOX q14d x 6 cycles  Last treatment date:  03/25/2024-next treatment scheduled for 04/08/24 Oral chemotherapy:  No  Symptom Triage Protocol:  Diarrhea  History of the problem:  Diarrhea Frequency of stools:  1 loose stool yesterday  Consistency:  Liquid  Color:  brown  Odor:  none  Undigested food or fat:  No  Mucous present:  No  Blood present:  No  Number of stools in past 24 hours:  1 large loose stool last night  Weight loss:  No  Urine output:  Normal voiding  Urine Character:  clear  Signs of dehydration:  no  Precipitating factors:  chemotherapy  Onset:  acute  Duration:  acute  Relieving factors:  Patient requesting RF for lomotil   Associated symptoms:  none  Changes in ADLs:  No     Diet History  Food intolerance:  none  Food aversions:  none  Food allergies:  none  Consumption of well water :  No  Ingestion of unpasteurized milk or milk products:  No  Consumption of raw seafood:  No  Recent travel abroad:  No  Exposure to farm animals or animal feces:  No   Triage Nurse Guidance Signs and Symptoms Action  Grossly bloody stool   Signs of severe dehydration  Severe lethargy or weakness  Heart palpitations  Decreased urine output  Sunken eyes  Orthostatic hypotension  Dizziness   Temperature higher than 100.4 F WITH suspect neutropenia   Symptoms of immune-mediated colitis, such as diarrhea accompanied by blood in the stool, severe abdominal pain, or tenderness   Symptoms of bowel perforation, such as  diarrhea associated with severe abdominal pain, fever chills, nausea, and vomiting  Seek emergency care.  Call an ambulance immediately.   Excessive thirst, dry mouth  Temperature higher than 100.4 F WITHOUT suspect neutropenia  Diarrhea for more than five (5) days  More than four to six stools above baseline per day for two days  Swollen or painful abdomen  More than 10 stools per day  Weight loss of more than five (5) pounds since diarrhea began  Continued diarrhea despite antidiarrheal treatment  Decreased turgor; pinched skin does not spring back  Grade 3 or 4 nausea and vomiting  Seek urgent care within 24 hours.   Less than four (4) stools per day  Chronic diarrhea  Other family members with diarrhea  Recent travel to a foreign country  New prescription  Follow homecare instructions.  Notify MD if no improvement.    Provider Consulted  Provider name and credentials: Msg sent to Dr. Melanee and [facility] providers.  Provider instruction: Refill request sent to Dr. Melanee   Patient Instruction  Disclaimer:  Patient specific and [employer] instruction provided verbally and sent through MyChart for active users.    Consider food and fluid modifications: Drink 8-10 eight-ounce glasses of fluids per day (e.g., water , diluted cranberry juice, sports drinks, decaffeinated tea or coffee). Eat foods high in soluble fiber (e.g., bananas, oatmeal, applesauce, skinned turkey, chicken, rice, toast). Consider foods containing pectin,  a natural fiber that decreases diarrhea. These foods include beets, peeled apples, white rice, bananas, baked potatoes without skin, white bread, plain pasta, avocadoes, and asparagus tips. Eat easy-to-digest foods high in protein, calories, and potassium. - Cook all vegetables well. Raw vegetables are difficult to digest. - Eat small, frequent meals. Do not eat large meals. - Eat foods at room temperature, as hot and cold temperature foods may insti-gate diarrhea. Avoid foods  high in insoluble fiber (e.g., raw fruits and vegetables, skins, seeds, legumes). Avoid milk and dairy products, caffeine, alcohol, sucrose, and sorbitol. - Avoid greasy, fatty, spicy, or fried foods and foods containing olestra. - Refrain from taking fiber supplements.  Implement a rectal skin care routine: Clean the perineal area well with mild soap and water  or aloe-based baby wipes. Apply barrier ointment (e.g., zinc  oxide) for protection. Sitz baths may add comfort.  Examine the rectal area for red, scaly, or broken skin. If this is present, report it to the healthcare provider. Record the frequency, quality, and volume of stools during the course of treatment.  If diarrhea lasts more than 24 hours, notify the healthcare provider.  Also, consult a healthcare provider before taking any over-the-counter antidiarrheal medications. These can be very effective but may not be appropriate for this situation.  If treatment with immunotherapy or targeted therapies has been prescribed, consultation with the healthcare team should begin prior to starting antidiarrheal.  If prescribed, keep track of medications administered--type, amount, and frequency.  Report the Following Problems: Inability to keep fluids down for 24 hours Diarrhea lasting more than 24 hours Dark yellow urine or absence of urine production More than four (4) to six (6) bowel movements above baseline per day for two days in a row Grade 2 or higher nausea and vomiting that accompanies diarrhea Dizziness Rectal bleeding Temperature greater than 100.4 F Swollen or painful abdomen Red, scaly, or broken skin of the rectal area  Teach Back Method used:  Yes   Nurse Triage Priority:  Non-urgent (review and reinforce self-care strategies)  Barriers to Care:  None identified  Nurse Triage Disposition:  Home care, return call if no improvement within 24 hours-patient requesting RF for Lomotil  at this time and would like to manage  symptoms from home as much as possible. He already had apt on 1/16 for IV fluids. He has already increased his PO fluid intake.  Protocol Source:  Ruthine HERO., & Eldonna CANDIE Pee.). (2019). Telephone triage for oncology nurses (3rd ed.). Oncology Nursing Society.

## 2024-04-03 NOTE — Addendum Note (Signed)
 Addended by: BULA POWELL PARAS on: 04/03/2024 11:49 AM   Modules accepted: Orders

## 2024-04-08 ENCOUNTER — Ambulatory Visit: Payer: Self-pay | Admitting: Oncology

## 2024-04-08 ENCOUNTER — Inpatient Hospital Stay

## 2024-04-08 ENCOUNTER — Encounter: Payer: Self-pay | Admitting: Oncology

## 2024-04-08 ENCOUNTER — Other Ambulatory Visit: Payer: Self-pay

## 2024-04-08 ENCOUNTER — Inpatient Hospital Stay: Admitting: Oncology

## 2024-04-08 VITALS — BP 118/62 | HR 80 | Temp 97.6°F | Resp 19 | Wt 149.2 lb

## 2024-04-08 VITALS — BP 120/63 | HR 72

## 2024-04-08 DIAGNOSIS — C159 Malignant neoplasm of esophagus, unspecified: Secondary | ICD-10-CM

## 2024-04-08 DIAGNOSIS — Z5112 Encounter for antineoplastic immunotherapy: Secondary | ICD-10-CM

## 2024-04-08 DIAGNOSIS — Z5111 Encounter for antineoplastic chemotherapy: Secondary | ICD-10-CM

## 2024-04-08 LAB — CMP (CANCER CENTER ONLY)
ALT: 8 U/L (ref 0–44)
AST: <10 U/L — ABNORMAL LOW (ref 15–41)
Albumin: 3.7 g/dL (ref 3.5–5.0)
Alkaline Phosphatase: 91 U/L (ref 38–126)
Anion gap: 11 (ref 5–15)
BUN: 15 mg/dL (ref 8–23)
CO2: 22 mmol/L (ref 22–32)
Calcium: 9 mg/dL (ref 8.9–10.3)
Chloride: 109 mmol/L (ref 98–111)
Creatinine: 1.05 mg/dL (ref 0.61–1.24)
GFR, Estimated: >60 mL/min
Glucose, Bld: 113 mg/dL — ABNORMAL HIGH (ref 70–99)
Potassium: 3.6 mmol/L (ref 3.5–5.1)
Sodium: 142 mmol/L (ref 135–145)
Total Bilirubin: 0.3 mg/dL (ref 0.0–1.2)
Total Protein: 6.5 g/dL (ref 6.5–8.1)

## 2024-04-08 LAB — CBC WITH DIFFERENTIAL (CANCER CENTER ONLY)
Abs Immature Granulocytes: 0.01 10*3/uL (ref 0.00–0.07)
Basophils Absolute: 0.1 10*3/uL (ref 0.0–0.1)
Basophils Relative: 1 %
Eosinophils Absolute: 0.3 10*3/uL (ref 0.0–0.5)
Eosinophils Relative: 7 %
HCT: 34.8 % — ABNORMAL LOW (ref 39.0–52.0)
Hemoglobin: 11.5 g/dL — ABNORMAL LOW (ref 13.0–17.0)
Immature Granulocytes: 0 %
Lymphocytes Relative: 19 %
Lymphs Abs: 0.7 10*3/uL (ref 0.7–4.0)
MCH: 30.3 pg (ref 26.0–34.0)
MCHC: 33 g/dL (ref 30.0–36.0)
MCV: 91.8 fL (ref 80.0–100.0)
Monocytes Absolute: 0.3 10*3/uL (ref 0.1–1.0)
Monocytes Relative: 8 %
Neutro Abs: 2.4 10*3/uL (ref 1.7–7.7)
Neutrophils Relative %: 65 %
Platelet Count: 248 10*3/uL (ref 150–400)
RBC: 3.79 MIL/uL — ABNORMAL LOW (ref 4.22–5.81)
RDW: 13.7 % (ref 11.5–15.5)
WBC Count: 3.7 10*3/uL — ABNORMAL LOW (ref 4.0–10.5)
nRBC: 0 % (ref 0.0–0.2)

## 2024-04-08 LAB — CEA: CEA: 18.9 ng/mL — ABNORMAL HIGH (ref 0.0–4.7)

## 2024-04-08 LAB — T4, FREE: Free T4: 0.75 ng/dL — ABNORMAL LOW (ref 0.80–2.00)

## 2024-04-08 LAB — TSH: TSH: 13.6 u[IU]/mL — ABNORMAL HIGH (ref 0.350–4.500)

## 2024-04-08 MED ORDER — FLUOROURACIL CHEMO INJECTION 2.5 GM/50ML
400.0000 mg/m2 | Freq: Once | INTRAVENOUS | Status: AC
  Administered 2024-04-08: 750 mg via INTRAVENOUS
  Filled 2024-04-08: qty 15

## 2024-04-08 MED ORDER — OXALIPLATIN CHEMO INJECTION 100 MG/20ML
65.0000 mg/m2 | Freq: Once | INTRAVENOUS | Status: AC
  Administered 2024-04-08: 120 mg via INTRAVENOUS
  Filled 2024-04-08: qty 24

## 2024-04-08 MED ORDER — LEUCOVORIN CALCIUM INJECTION 350 MG
400.0000 mg/m2 | Freq: Once | INTRAVENOUS | Status: AC
  Administered 2024-04-08: 728 mg via INTRAVENOUS
  Filled 2024-04-08: qty 25

## 2024-04-08 MED ORDER — DEXAMETHASONE SOD PHOSPHATE PF 10 MG/ML IJ SOLN
10.0000 mg | Freq: Once | INTRAMUSCULAR | Status: AC
  Administered 2024-04-08: 10 mg via INTRAVENOUS
  Filled 2024-04-08: qty 1

## 2024-04-08 MED ORDER — PALONOSETRON HCL INJECTION 0.25 MG/5ML
0.2500 mg | Freq: Once | INTRAVENOUS | Status: AC
  Administered 2024-04-08: 0.25 mg via INTRAVENOUS
  Filled 2024-04-08: qty 5

## 2024-04-08 MED ORDER — SODIUM CHLORIDE 0.9 % IV SOLN
2400.0000 mg/m2 | INTRAVENOUS | Status: DC
  Administered 2024-04-08: 4350 mg via INTRAVENOUS
  Filled 2024-04-08: qty 87

## 2024-04-08 MED ORDER — DEXTROSE 5 % IV SOLN
INTRAVENOUS | Status: DC
  Filled 2024-04-08: qty 250

## 2024-04-08 MED ORDER — LEVOTHYROXINE SODIUM 25 MCG PO TABS: 25.0000 ug | ORAL_TABLET | Freq: Every day | ORAL | 2 refills | Status: AC

## 2024-04-08 MED ORDER — SODIUM CHLORIDE 0.9 % IV SOLN
240.0000 mg | Freq: Once | INTRAVENOUS | Status: AC
  Administered 2024-04-08: 240 mg via INTRAVENOUS
  Filled 2024-04-08: qty 24

## 2024-04-08 NOTE — Patient Instructions (Signed)
 [facility] - A DEPT OF [facility]  Discharge Instructions: Thank you for choosing [facility] to provide your oncology and hematology care.  If you have a lab appointment with the Cancer Center, please go directly to the Cancer Center and check in at the registration area.  Wear comfortable clothing and clothing appropriate for easy access to any Portacath or PICC line.   We strive to give you quality time with your provider. You may need to reschedule your appointment if you arrive late (15 or more minutes).  Arriving late affects you and other patients whose appointments are after yours.  Also, if you miss three or more appointments without notifying the office, you may be dismissed from the clinic at the providers discretion.      For prescription refill requests, have your pharmacy contact our office and allow 72 hours for refills to be completed.    Today you received the following chemotherapy and/or immunotherapy agents- opdivo , oxaliplatin , 5FU      To help prevent nausea and vomiting after your treatment, we encourage you to take your nausea medication as directed.  BELOW ARE SYMPTOMS THAT SHOULD BE REPORTED IMMEDIATELY: *FEVER GREATER THAN 100.4 F (38 C) OR HIGHER *CHILLS OR SWEATING *NAUSEA AND VOMITING THAT IS NOT CONTROLLED WITH YOUR NAUSEA MEDICATION *UNUSUAL SHORTNESS OF BREATH *UNUSUAL BRUISING OR BLEEDING *URINARY PROBLEMS (pain or burning when urinating, or frequent urination) *BOWEL PROBLEMS (unusual diarrhea, constipation, pain near the anus) TENDERNESS IN MOUTH AND THROAT WITH OR WITHOUT PRESENCE OF ULCERS (sore throat, sores in mouth, or a toothache) UNUSUAL RASH, SWELLING OR PAIN  UNUSUAL VAGINAL DISCHARGE OR ITCHING   Items with * indicate a potential emergency and should be followed up as soon as possible or go to the Emergency Department if any problems should occur.  Please show the CHEMOTHERAPY ALERT CARD or  IMMUNOTHERAPY ALERT CARD at check-in to the Emergency Department and triage nurse.  Should you have questions after your visit or need to cancel or reschedule your appointment, please contact [facility] - A DEPT OF [facility]  [phone] and follow the prompts.  Office hours are 8:00 a.m. to 4:30 p.m. Monday - Friday. Please note that voicemails left after 4:00 p.m. may not be returned until the following business day.  We are closed weekends and major holidays. You have access to a nurse at all times for urgent questions. Please call the main number to the clinic [phone] and follow the prompts.  For any non-urgent questions, you may also contact your provider using MyChart. We now offer e-Visits for anyone 37 and older to request care online for non-urgent symptoms. For details visit [email or web address].   Also download the MyChart app! Go to the app store, search MyChart, open the app, select [facility], and log in with your MyChart username and password.

## 2024-04-08 NOTE — Telephone Encounter (Signed)
 Per Dr. 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 I would like him to get started with levothyroxine  25 mcg daily..  Outbound call; informed of above. Also advised to take it first thing in the morning with water , at least 30-60 minutes before eating or drinking anything else (including coffee). Patient said his wife takes the same medication so he is aware of dosing instructions. Patient requested for medication to be sent to [facility] in [place].

## 2024-04-08 NOTE — Telephone Encounter (Signed)
 Per Dr. Melanee Please add free T4 today and repeat TSH in 4 weeks.  T4 was added on to today's labs and TSH in 4 weeks order has been added.

## 2024-04-08 NOTE — Progress Notes (Signed)
 "    Hematology/Oncology Consult note [facility]  [phone] [phone]  Patient Care Team: Auston Reyes BIRCH, MD as PCP - General (Internal Medicine) End, Lonni, MD as PCP - Cardiology (Cardiology) Army Dallas NOVAK, MD (Inactive) as Consulting Physician (Cardiothoracic Surgery) Melanee Annah BROCKS, MD as Consulting Physician (Oncology) Lenn Aran, MD as Referring Physician (Radiation Oncology)   Name of the patient: Casey Acevedo  [id number]  02/02/1941   Date of visit: 04/08/24  Diagnosis-  1.  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  Metastatic castrate sensitive prostate cancer    Chief complaint/ Reason for visit- on treatment assessment prior to cycle 2 of folfox along with opdivo   Heme/Onc history:  Patient is a 84 year old male 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 Pathology showed no residual carcinoma at primary site. 1/5 LN positive for malignancy. 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  Patient currently remains in remission    Patient was diagnosed with high-grade prostate cancer in 2021.  Initial PSA was 14.  It was a stage IIIc adenocarcinoma with a Gleason's 9.  He received IMRT radiation therapy following which his PSA declined to 0.05.  He received 1 dose of Lupron  in April 2023 but none after that.  His PSA was 0.05 a year ago and then went up to 0.226 months ago and is presently at 10.9. CT chest abdomen and pelvis with contrast showed multifocal sclerotic osseous foci along with enlarged retroperitoneal and hiatal lymph nodes consistent with malignancy.  Bone scan also showed abnormal radiotracer uptake involving calvarium spine ribs right  clavicle and pelvic girdle.   Patient is currently on ADT and was started on apalutamide  in December 2024. CT chest abdomen and pelvis with contrast in November 2025 which was done for follow-up of prostate cancer showed soft tissue mass about the left diaphragmatic crus consistent with recurrent disease.  Enlarged celiac axis and left retroperitoneal lymph nodes.  PET CT scan also showed areas of multiple liver metastases.  Liver biopsy was consistent with metastatic squamous cell carcinoma.  CEA elevated at 10   Tempus testing showed CPS score of 4.  High tumor mutational burden.PIK 3 CA mutation.  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  He recently completed his first cycle of FOLFOX chemotherapy for stage IV esophageal cancer without major complications. He developed an upper respiratory infection following treatment, which he attributes as unrelated to chemotherapy, and was managed by the nurse practitioner. He denies pain and does not require analgesics. He remains active and independent, able to drive himself to appointments despite inclement weather.  He continues oral therapy for prostate cancer and receives Lupron  injections every three months. He is adherent to his oral regimen and will continue with scheduled Lupron , with timing of the next dose to be confirmed. He experiences nocturia, requiring frequent nighttime awakenings, and has been sleeping in the guest bedroom for convenience.  He inquired about infection risk to his pregnant granddaughter-in-law during an upcoming family visit and was reassured regarding safety.       ECOG PS- 1 Pain scale- 0   Review of systems- Review of Systems  Constitutional:  Negative for chills, fever, malaise/fatigue and weight loss.  HENT:  Negative for congestion, ear  discharge and nosebleeds.   Eyes:  Negative for blurred vision.  Respiratory:  Negative for cough, hemoptysis, sputum production, shortness of breath and wheezing.   Cardiovascular:  Negative for chest pain, palpitations, orthopnea and claudication.  Gastrointestinal:  Negative for abdominal pain, blood in stool, constipation, diarrhea, heartburn, melena, nausea and vomiting.  Genitourinary:  Negative for dysuria, flank pain, frequency, hematuria and urgency.  Musculoskeletal:  Negative for back pain, joint pain and myalgias.  Skin:  Negative for rash.  Neurological:  Negative for dizziness, tingling, focal weakness, seizures, weakness and headaches.  Endo/Heme/Allergies:  Does not bruise/bleed easily.  Psychiatric/Behavioral:  Negative for depression and suicidal ideas. The patient does not have insomnia.       Allergies[1]   Past Medical History:  Diagnosis Date   Aortic atherosclerosis    Arthritis    Bladder tumor    Compression fracture of thoracic spine, non-traumatic (HCC) 01/28/2018   Coronary artery calcification    Dysphagia    Elevated liver enzymes    Esophageal cancer (HCC) 2018   Chemo + Rad tx's with surgical resection.    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 (HCC) 06/05/2017     Past Surgical History:  Procedure Laterality Date   CHOLECYSTECTOMY     COLONOSCOPY     COMPLETE ESOPHAGECTOMY N/A 06/18/2017   Procedure: cervical ESOPHAGECTOMY COMPLETE;  Surgeon: Army Dallas NOVAK, MD;  Location: [facility] OR;  Service: Thoracic;  Laterality: N/A;  NEED NIMS ET TUBE   ESOPHAGOGASTRODUODENOSCOPY (EGD) WITH PROPOFOL  N/A 12/07/2016   Procedure: ESOPHAGOGASTRODUODENOSCOPY (EGD) WITH PROPOFOL ;  Surgeon: Therisa Bi, MD;  Location: [facility] ENDOSCOPY;  Service: Gastroenterology;  Laterality: N/A;   ESOPHAGOGASTRODUODENOSCOPY (EGD) WITH PROPOFOL  N/A 12/26/2016   Procedure: ESOPHAGOGASTRODUODENOSCOPY (EGD) WITH PROPOFOL  WITH DILATION;  Surgeon: Therisa Bi, MD;   Location: [facility] ENDOSCOPY;  Service: Gastroenterology;  Laterality: N/A;   EYE SURGERY Bilateral    Cataracts   GASTROJEJUNOSTOMY N/A 01/16/2017   Procedure: LAPROSCOPIC ASSIST FEEDING JEJUNOSTOMY TUBE;  Surgeon: Gladis Cough, MD;  Location: [facility] ORS;  Service: General;  Laterality: N/A;   IR FLUORO GUIDE PORT INSERTION RIGHT  01/10/2017   IR IMAGING GUIDED PORT INSERTION  03/07/2024   IR REMOVAL TUN ACCESS W/ PORT W/O FL MOD SED  03/22/2018   IR REPLACE G-TUBE SIMPLE WO FLUORO  03/15/2017   IR REPLC DUODEN/JEJUNO TUBE PERCUT W/FLUORO  03/05/2017   KYPHOPLASTY N/A 08/31/2020   Procedure: L1 KYPHOPLASTY;  Surgeon: Kathlynn Sharper, MD;  Location: [facility] ORS;  Service: Orthopedics;  Laterality: N/A;   KYPHOPLASTY N/A 10/05/2020   Procedure: T12 KYPHOPLASTY;  Surgeon: Kathlynn Sharper, MD;  Location: [facility] ORS;  Service: Orthopedics;  Laterality: N/A;   PICC LINE INSERTION Right    PROSTATE BIOPSY N/A 05/03/2021   Procedure: PROSTATE BIOPSY;  Surgeon: Twylla Glendia BROCKS, MD;  Location: [facility] ORS;  Service: Urology;  Laterality: N/A;   PYLOROMYOTOMY N/A 06/18/2017   Procedure: JUDITHE;  Surgeon: Army Dallas NOVAK, MD;  Location: [facility] OR;  Service: Thoracic;  Laterality: N/A;   TRANSRECTAL ULTRASOUND N/A 05/03/2021   Procedure: TRANSRECTAL ULTRASOUND;  Surgeon: Twylla Glendia BROCKS, MD;  Location: [facility] ORS;  Service: Urology;  Laterality: N/A;   TRANSURETHRAL RESECTION OF BLADDER TUMOR N/A 02/15/2021   Procedure: TRANSURETHRAL RESECTION OF BLADDER TUMOR (TURBT);  Surgeon: Twylla Glendia BROCKS, MD;  Location: [facility] ORS;  Service: Urology;  Laterality: N/A;   TRANSURETHRAL RESECTION OF  BLADDER TUMOR WITH MITOMYCIN -C N/A 01/13/2020   Procedure: TRANSURETHRAL RESECTION OF BLADDER TUMOR WITH gemcitabine ;  Surgeon: Twylla Glendia BROCKS, MD;  Location: [facility] ORS;  Service: Urology;  Laterality: N/A;   TRANSURETHRAL RESECTION OF PROSTATE N/A 05/03/2021   Procedure: TRANSURETHRAL RESECTION OF THE PROSTATE (TURP);  Surgeon:  Twylla Glendia BROCKS, MD;  Location: [facility] ORS;  Service: Urology;  Laterality: N/A;   VIDEO BRONCHOSCOPY N/A 06/18/2017   Procedure: VIDEO BRONCHOSCOPY;  Surgeon: Army Dallas NOVAK, MD;  Location: [facility] OR;  Service: Thoracic;  Laterality: N/A;    Social History   Socioeconomic History   Marital status: Married    Spouse name: Not on file   Number of children: Not on file   Years of education: Not on file   Highest education level: Not on file  Occupational History   Not on file  Tobacco Use   Smoking status: Never   Smokeless tobacco: Never  Vaping Use   Vaping status: Never Used  Substance and Sexual Activity   Alcohol use: No   Drug use: No   Sexual activity: Not Currently  Other Topics Concern   Not on file  Social History Narrative   Independent at baseline. Ambulatory   Social Drivers of Health   Tobacco Use: Low Risk (04/08/2024)   Patient History    Smoking Tobacco Use: Never    Smokeless Tobacco Use: Never    Passive Exposure: Not on file  Financial Resource Strain: Low Risk  (12/06/2023)   Received from [facility]   Overall Financial Resource Strain (CARDIA)    Difficulty of Paying Living Expenses: Not hard at all  Food Insecurity: No Food Insecurity (12/06/2023)   Received from [facility]   Epic    Within the past 12 months, you worried that your food would run out before you got the money to buy more.: Never true    Within the past 12 months, the food you bought just didn't last and you didn't have money to get more.: Never true  Transportation Needs: No Transportation Needs (12/06/2023)   Received from [facility] - Transportation    In the past 12 months, has lack of transportation kept you from medical appointments or from getting medications?: No    Lack of Transportation (Non-Medical): No  Physical Activity: Not on file  Stress: Not on file  Social Connections: Not on file  Intimate Partner  Violence: Not on file  Depression (PHQ2-9): Low Risk (03/28/2024)   Depression (PHQ2-9)    PHQ-2 Score: 0  Alcohol Screen: Not on file  Housing: Low Risk  (12/06/2023)   Received from [facility]   Epic    In the last 12 months, was there a time when you were not able to pay the mortgage or rent on time?: No    In the past 12 months, how many times have you moved where you were living?: 0    At any time in the past 12 months, were you homeless or living in a shelter (including now)?: No  Utilities: Not At Risk (12/06/2023)   Received from [facility]   Epic    In the past 12 months has the electric, gas, oil, or water  company threatened to shut off services in your home?: No  Health Literacy: Adequate Health Literacy (04/13/2023)   B1300 Health Literacy    Frequency of need for help with medical instructions: Never  Family History  Problem Relation Age of Onset   Heart disease Father    Diabetes Mother    Hypertension Mother    Heart attack Brother 6   Prostate cancer Neg Hx    Bladder Cancer Neg Hx    Kidney cancer Neg Hx     Current Medications[2]  Physical exam:  Vitals:   04/08/24 1014  BP: 118/62  Pulse: 80  Resp: 19  Temp: 97.6 F (36.4 C)  SpO2: 98%  Weight: 149 lb 3.2 oz (67.7 kg)   Physical Exam Cardiovascular:     Rate and Rhythm: Normal rate and regular rhythm.     Heart sounds: Normal heart sounds.  Pulmonary:     Effort: Pulmonary effort is normal.     Breath sounds: Normal breath sounds.  Skin:    General: Skin is warm and dry.  Neurological:     Mental Status: He is alert and oriented to person, place, and time.      I have personally reviewed labs listed below:    Latest Ref Rng & Units 03/28/2024   10:56 AM  CMP  Glucose 70 - 99 mg/dL 882   BUN 8 - 23 mg/dL 16   Creatinine 9.38 - 1.24 mg/dL 8.98   Sodium 864 - 854 mmol/L 136   Potassium 3.5 - 5.1 mmol/L 4.1   Chloride 98 - 111 mmol/L 99   CO2 22 -  32 mmol/L 25   Calcium  8.9 - 10.3 mg/dL 9.0   Total Protein 6.5 - 8.1 g/dL 6.8   Total Bilirubin 0.0 - 1.2 mg/dL 0.5   Alkaline Phos 38 - 126 U/L 113   AST 15 - 41 U/L 14   ALT 0 - 44 U/L 10       Latest Ref Rng & Units 04/08/2024    9:55 AM  CBC  WBC 4.0 - 10.5 K/uL 3.7   Hemoglobin 13.0 - 17.0 g/dL 88.4   Hematocrit 60.9 - 52.0 % 34.8   Platelets 150 - 400 K/uL 248    I have personally reviewed Radiology images listed below: No images are attached to the encounter.  DG Chest 2 View Result Date: 03/28/2024 EXAM: 2 VIEW(S) XRAY OF THE CHEST 03/28/2024 03:08:00 PM COMPARISON: None available. CLINICAL HISTORY: acute cough- on chemotherapy FINDINGS: LINES, TUBES AND DEVICES: Right Port-A-Cath in place with tip in mid SVC. LUNGS AND PLEURA: Large hiatal hernia. No focal pulmonary opacity. No pleural effusion. No pneumothorax. HEART AND MEDIASTINUM: Ectatic and calcified aorta. No acute abnormality of the cardiac silhouette. BONES AND SOFT TISSUES: Old left rib fractures. Thoracic degenerative changes. Kyphoplasty at thoracolumbar junction. No acute osseous abnormality. IMPRESSION: 1. No acute cardiopulmonary abnormality. 2. Large hiatal hernia. 3. Right chest Port-A-Cath with tip in the mid SVC. Electronically signed by: Greig Pique MD 03/28/2024 03:17 PM EST RP Workstation: [id number]     Assessment and plan- Patient is a 84 y.o. male with history of metastatic castrate sensitive prostate cancer and now with metastatic squamous cell carcinoma of the esophagus with liver metastases   Assessment and Plan    Stage IV esophageal cancer Counts okay to proceed with cycle 2 of palliative FOLFOX today and we will be adding Opdivo  at this time. - - Planned treatment every two weeks and monitor CEA every 2 weeks. - Repeat imaging after six cycles White cell count is 3.7 today with an ANC of 2.4.  It has dropped from ANC of 7.42 weeks ago.  I will plan  to add udenyca  on day 3 of pump disconnect  starting this cycle if insurance approves  Prostate cancer On oral therapy and scheduled androgen deprivation with Lupron .  He will be due for his next dose around 05/05/2023 6 - He will continue apalutamide  until progression or toxicity   Chemotherapy-induced neutropenia prophylaxis Neutropenia due to chemotherapy. Prophylactic white blood cell growth factor planned. Counseled on potential flu-like symptoms and advised Claritin use. - Administer white blood cell growth factor injection at pump removal - Advised to take Claritin for 3-4 days starting tomorrow to reduce flu-like symptoms post-injection         Visit Diagnosis 1. Stage IV malignant neoplasm of esophagus (HCC)   2. Encounter for antineoplastic chemotherapy   3. Encounter for antineoplastic immunotherapy      Dr. Annah Skene, MD, MPH [facility] [phone] 04/08/2024 10:20 AM                   [1]  Allergies Allergen Reactions   Tramadol  Other (See Comments)    Hallucination Pt states that it made crazy.  [2]  Current Outpatient Medications:    Acetaminophen  500 MG capsule, Take by mouth., Disp: , Rfl:    aspirin 325 MG tablet, Take 325 mg by mouth daily as needed for mild pain (pain score 1-3)., Disp: , Rfl:    Calcium  Carb-Cholecalciferol 500-10 MG-MCG TABS, Take 1 tablet by mouth daily., Disp: , Rfl:    dexamethasone  (DECADRON ) 4 MG tablet, Take 2 tablets (8 mg total) by mouth daily. Start the day after chemotherapy for 2 days. Take with food., Disp: 30 tablet, Rfl: 1   diphenoxylate -atropine  (LOMOTIL ) 2.5-0.025 MG tablet, Take 1 tablet by mouth 4 (four) times daily as needed for diarrhea or loose stools., Disp: 30 tablet, Rfl: 1   ERLEADA  60 MG tablet, TAKE 3 TABLETS BY MOUTH EVERY DAY, Disp: 120 tablet, Rfl: 2   HYDROcodone  bit-homatropine (HYCODAN) 5-1.5 MG/5ML syrup, Take 5 mLs by mouth every 8 (eight) hours as needed for cough. May cause drowsiness, Disp: 120 mL,  Rfl: 0   lidocaine -prilocaine  (EMLA ) cream, Apply to affected area once, Disp: 30 g, Rfl: 3   Multiple Vitamin (MULTIVITAMIN) tablet, Take 1 tablet by mouth daily., Disp: , Rfl:    ondansetron  (ZOFRAN ) 8 MG tablet, Take 1 tablet (8 mg total) by mouth every 8 (eight) hours as needed for nausea or vomiting., Disp: 60 tablet, Rfl: 2   ondansetron  (ZOFRAN ) 8 MG tablet, Take 1 tablet (8 mg total) by mouth every 8 (eight) hours as needed for nausea or vomiting. Start on the third day after chemotherapy., Disp: 30 tablet, Rfl: 1   oxyCODONE  (OXY IR/ROXICODONE ) 5 MG immediate release tablet, Take 1 tablet (5 mg total) by mouth every 4 (four) hours as needed for severe pain (pain score 7-10)., Disp: 120 tablet, Rfl: 0   prochlorperazine  (COMPAZINE ) 10 MG tablet, Take 1 tablet (10 mg total) by mouth every 6 (six) hours as needed for nausea or vomiting., Disp: 90 tablet, Rfl: 2  "

## 2024-04-09 ENCOUNTER — Encounter: Payer: Self-pay | Admitting: Oncology

## 2024-04-09 LAB — T4: T4, Total: 5.3 ug/dL (ref 4.5–12.0)

## 2024-04-10 ENCOUNTER — Inpatient Hospital Stay

## 2024-04-10 VITALS — BP 123/65 | HR 83 | Temp 97.3°F | Resp 16

## 2024-04-10 DIAGNOSIS — Z5112 Encounter for antineoplastic immunotherapy: Secondary | ICD-10-CM | POA: Diagnosis not present

## 2024-04-10 DIAGNOSIS — C159 Malignant neoplasm of esophagus, unspecified: Secondary | ICD-10-CM

## 2024-04-10 MED ORDER — PEGFILGRASTIM-CBQV 6 MG/0.6ML ~~LOC~~ SOSY
6.0000 mg | PREFILLED_SYRINGE | Freq: Once | SUBCUTANEOUS | Status: AC
  Administered 2024-04-10: 6 mg via SUBCUTANEOUS

## 2024-04-10 NOTE — Patient Instructions (Signed)

## 2024-04-22 ENCOUNTER — Inpatient Hospital Stay

## 2024-04-22 ENCOUNTER — Inpatient Hospital Stay: Admitting: Oncology

## 2024-04-24 ENCOUNTER — Inpatient Hospital Stay

## 2024-05-22 ENCOUNTER — Inpatient Hospital Stay: Admitting: Hospice and Palliative Medicine

## 2025-02-10 ENCOUNTER — Encounter (INDEPENDENT_AMBULATORY_CARE_PROVIDER_SITE_OTHER): Admitting: Ophthalmology
